# Patient Record
Sex: Female | Born: 1935
Health system: Southern US, Community
[De-identification: ages and names within clinical notes are randomized; demographics above are authoritative.]

## PROBLEM LIST (undated history)

## (undated) DIAGNOSIS — I5189 Other ill-defined heart diseases: Secondary | ICD-10-CM

## (undated) DIAGNOSIS — R32 Unspecified urinary incontinence: Secondary | ICD-10-CM

## (undated) DIAGNOSIS — I2510 Atherosclerotic heart disease of native coronary artery without angina pectoris: Secondary | ICD-10-CM

## (undated) DIAGNOSIS — I639 Cerebral infarction, unspecified: Secondary | ICD-10-CM

## (undated) DIAGNOSIS — E1140 Type 2 diabetes mellitus with diabetic neuropathy, unspecified: Secondary | ICD-10-CM

## (undated) DIAGNOSIS — I10 Essential (primary) hypertension: Secondary | ICD-10-CM

## (undated) DIAGNOSIS — E119 Type 2 diabetes mellitus without complications: Secondary | ICD-10-CM

## (undated) HISTORY — DX: Atherosclerotic heart disease of native coronary artery without angina pectoris: I25.10

## (undated) HISTORY — DX: Unspecified urinary incontinence: R32

## (undated) HISTORY — PX: APPENDECTOMY: SHX54

## (undated) HISTORY — DX: Type 2 diabetes mellitus with diabetic neuropathy, unspecified: E11.40

## (undated) HISTORY — DX: Other ill-defined heart diseases: I51.89

## (undated) HISTORY — PX: ABDOMINAL HYSTERECTOMY: SHX81

## (undated) HISTORY — PX: CATARACT EXTRACTION, BILATERAL: SHX1313

## (undated) HISTORY — PX: BREAST SURGERY: SHX581

---

## 2001-05-14 ENCOUNTER — Ambulatory Visit (HOSPITAL_COMMUNITY): Admission: RE | Admit: 2001-05-14 | Discharge: 2001-05-14 | Payer: Self-pay | Admitting: Family Medicine

## 2001-05-14 ENCOUNTER — Encounter: Payer: Self-pay | Admitting: Family Medicine

## 2003-06-11 ENCOUNTER — Ambulatory Visit (HOSPITAL_COMMUNITY): Admission: RE | Admit: 2003-06-11 | Discharge: 2003-06-11 | Payer: Self-pay | Admitting: Family Medicine

## 2003-06-11 ENCOUNTER — Encounter: Payer: Self-pay | Admitting: Family Medicine

## 2003-06-21 ENCOUNTER — Ambulatory Visit (HOSPITAL_COMMUNITY): Admission: RE | Admit: 2003-06-21 | Discharge: 2003-06-21 | Payer: Self-pay | Admitting: Family Medicine

## 2003-06-21 ENCOUNTER — Encounter: Payer: Self-pay | Admitting: Family Medicine

## 2003-06-29 ENCOUNTER — Ambulatory Visit (HOSPITAL_BASED_OUTPATIENT_CLINIC_OR_DEPARTMENT_OTHER): Admission: RE | Admit: 2003-06-29 | Discharge: 2003-06-30 | Payer: Self-pay | Admitting: Orthopedic Surgery

## 2003-09-10 ENCOUNTER — Encounter: Payer: Self-pay | Admitting: Family Medicine

## 2003-09-10 ENCOUNTER — Ambulatory Visit (HOSPITAL_COMMUNITY): Admission: RE | Admit: 2003-09-10 | Discharge: 2003-09-10 | Payer: Self-pay | Admitting: Family Medicine

## 2004-10-23 ENCOUNTER — Encounter (HOSPITAL_COMMUNITY): Admission: RE | Admit: 2004-10-23 | Discharge: 2004-11-22 | Payer: Self-pay | Admitting: Podiatry

## 2005-03-19 ENCOUNTER — Ambulatory Visit: Payer: Self-pay | Admitting: Orthopedic Surgery

## 2005-03-23 ENCOUNTER — Ambulatory Visit (HOSPITAL_COMMUNITY): Admission: RE | Admit: 2005-03-23 | Discharge: 2005-03-23 | Payer: Self-pay | Admitting: Orthopedic Surgery

## 2005-03-23 ENCOUNTER — Ambulatory Visit: Payer: Self-pay | Admitting: Orthopedic Surgery

## 2005-03-26 ENCOUNTER — Ambulatory Visit: Payer: Self-pay | Admitting: Orthopedic Surgery

## 2005-04-16 ENCOUNTER — Ambulatory Visit: Payer: Self-pay | Admitting: Orthopedic Surgery

## 2005-04-30 ENCOUNTER — Ambulatory Visit: Payer: Self-pay | Admitting: Orthopedic Surgery

## 2005-06-18 ENCOUNTER — Ambulatory Visit: Payer: Self-pay | Admitting: Orthopedic Surgery

## 2005-07-19 ENCOUNTER — Ambulatory Visit (HOSPITAL_COMMUNITY): Admission: RE | Admit: 2005-07-19 | Discharge: 2005-07-19 | Payer: Self-pay | Admitting: Family Medicine

## 2005-07-30 ENCOUNTER — Ambulatory Visit (HOSPITAL_COMMUNITY): Admission: RE | Admit: 2005-07-30 | Discharge: 2005-07-30 | Payer: Self-pay | Admitting: General Surgery

## 2006-04-28 ENCOUNTER — Inpatient Hospital Stay (HOSPITAL_COMMUNITY): Admission: EM | Admit: 2006-04-28 | Discharge: 2006-05-02 | Payer: Self-pay | Admitting: Emergency Medicine

## 2006-05-06 ENCOUNTER — Encounter (INDEPENDENT_AMBULATORY_CARE_PROVIDER_SITE_OTHER): Payer: Self-pay | Admitting: *Deleted

## 2006-05-06 ENCOUNTER — Inpatient Hospital Stay (HOSPITAL_COMMUNITY): Admission: RE | Admit: 2006-05-06 | Discharge: 2006-05-09 | Payer: Self-pay | Admitting: Obstetrics and Gynecology

## 2007-05-28 ENCOUNTER — Ambulatory Visit (HOSPITAL_COMMUNITY): Admission: RE | Admit: 2007-05-28 | Discharge: 2007-05-28 | Payer: Self-pay | Admitting: Family Medicine

## 2007-06-04 ENCOUNTER — Encounter: Payer: Self-pay | Admitting: Orthopedic Surgery

## 2007-07-01 ENCOUNTER — Encounter: Payer: Self-pay | Admitting: Neurosurgery

## 2007-07-25 ENCOUNTER — Encounter: Payer: Self-pay | Admitting: Neurosurgery

## 2007-08-25 ENCOUNTER — Encounter: Payer: Self-pay | Admitting: Neurosurgery

## 2007-09-24 ENCOUNTER — Encounter: Payer: Self-pay | Admitting: Neurosurgery

## 2007-10-25 ENCOUNTER — Encounter: Payer: Self-pay | Admitting: Neurosurgery

## 2008-10-01 ENCOUNTER — Encounter: Payer: Self-pay | Admitting: Orthopedic Surgery

## 2008-10-01 DIAGNOSIS — Z8679 Personal history of other diseases of the circulatory system: Secondary | ICD-10-CM | POA: Insufficient documentation

## 2008-10-01 DIAGNOSIS — IMO0002 Reserved for concepts with insufficient information to code with codable children: Secondary | ICD-10-CM | POA: Insufficient documentation

## 2008-10-01 DIAGNOSIS — E1165 Type 2 diabetes mellitus with hyperglycemia: Secondary | ICD-10-CM | POA: Insufficient documentation

## 2008-10-01 DIAGNOSIS — E118 Type 2 diabetes mellitus with unspecified complications: Secondary | ICD-10-CM

## 2008-10-01 DIAGNOSIS — E1140 Type 2 diabetes mellitus with diabetic neuropathy, unspecified: Secondary | ICD-10-CM | POA: Insufficient documentation

## 2008-10-04 ENCOUNTER — Ambulatory Visit: Payer: Self-pay | Admitting: Orthopedic Surgery

## 2008-10-04 DIAGNOSIS — M479 Spondylosis, unspecified: Secondary | ICD-10-CM | POA: Insufficient documentation

## 2008-10-04 DIAGNOSIS — M25519 Pain in unspecified shoulder: Secondary | ICD-10-CM | POA: Insufficient documentation

## 2008-10-05 ENCOUNTER — Telehealth: Payer: Self-pay | Admitting: Orthopedic Surgery

## 2009-01-12 ENCOUNTER — Ambulatory Visit: Payer: Self-pay | Admitting: Orthopedic Surgery

## 2009-01-12 DIAGNOSIS — M4800 Spinal stenosis, site unspecified: Secondary | ICD-10-CM | POA: Insufficient documentation

## 2009-01-12 DIAGNOSIS — Q762 Congenital spondylolisthesis: Secondary | ICD-10-CM | POA: Insufficient documentation

## 2009-01-12 DIAGNOSIS — M545 Low back pain, unspecified: Secondary | ICD-10-CM | POA: Insufficient documentation

## 2009-01-12 DIAGNOSIS — M25559 Pain in unspecified hip: Secondary | ICD-10-CM | POA: Insufficient documentation

## 2009-01-31 ENCOUNTER — Ambulatory Visit: Payer: Self-pay | Admitting: Orthopedic Surgery

## 2009-02-07 ENCOUNTER — Telehealth: Payer: Self-pay | Admitting: Orthopedic Surgery

## 2009-02-08 ENCOUNTER — Telehealth: Payer: Self-pay | Admitting: Orthopedic Surgery

## 2009-02-15 ENCOUNTER — Encounter: Payer: Self-pay | Admitting: Orthopedic Surgery

## 2009-02-21 ENCOUNTER — Ambulatory Visit: Payer: Self-pay | Admitting: Orthopedic Surgery

## 2009-02-24 ENCOUNTER — Telehealth: Payer: Self-pay | Admitting: Orthopedic Surgery

## 2009-02-25 ENCOUNTER — Encounter: Admission: RE | Admit: 2009-02-25 | Discharge: 2009-02-25 | Payer: Self-pay | Admitting: Orthopedic Surgery

## 2009-03-17 ENCOUNTER — Encounter: Admission: RE | Admit: 2009-03-17 | Discharge: 2009-03-17 | Payer: Self-pay | Admitting: Orthopedic Surgery

## 2009-04-12 ENCOUNTER — Ambulatory Visit: Payer: Self-pay | Admitting: Orthopedic Surgery

## 2009-04-14 ENCOUNTER — Encounter (INDEPENDENT_AMBULATORY_CARE_PROVIDER_SITE_OTHER): Payer: Self-pay | Admitting: *Deleted

## 2009-04-18 ENCOUNTER — Telehealth: Payer: Self-pay | Admitting: Orthopedic Surgery

## 2009-04-27 ENCOUNTER — Encounter: Payer: Self-pay | Admitting: Orthopedic Surgery

## 2009-05-10 ENCOUNTER — Ambulatory Visit (HOSPITAL_COMMUNITY): Admission: RE | Admit: 2009-05-10 | Discharge: 2009-05-10 | Payer: Self-pay | Admitting: Family Medicine

## 2009-05-10 ENCOUNTER — Encounter: Payer: Self-pay | Admitting: Neurosurgery

## 2009-05-24 ENCOUNTER — Encounter: Payer: Self-pay | Admitting: Neurosurgery

## 2009-06-23 ENCOUNTER — Encounter: Payer: Self-pay | Admitting: Neurosurgery

## 2009-07-11 ENCOUNTER — Encounter: Payer: Self-pay | Admitting: Orthopedic Surgery

## 2009-07-17 ENCOUNTER — Emergency Department (HOSPITAL_COMMUNITY): Admission: EM | Admit: 2009-07-17 | Discharge: 2009-07-17 | Payer: Self-pay | Admitting: Emergency Medicine

## 2009-07-24 ENCOUNTER — Encounter: Payer: Self-pay | Admitting: Neurosurgery

## 2009-08-03 ENCOUNTER — Emergency Department (HOSPITAL_COMMUNITY): Admission: EM | Admit: 2009-08-03 | Discharge: 2009-08-03 | Payer: Self-pay | Admitting: Emergency Medicine

## 2009-08-19 ENCOUNTER — Inpatient Hospital Stay (HOSPITAL_COMMUNITY): Admission: RE | Admit: 2009-08-19 | Discharge: 2009-08-25 | Payer: Self-pay | Admitting: Neurosurgery

## 2009-09-06 ENCOUNTER — Inpatient Hospital Stay (HOSPITAL_COMMUNITY): Admission: EM | Admit: 2009-09-06 | Discharge: 2009-09-08 | Payer: Self-pay | Admitting: Emergency Medicine

## 2009-09-06 ENCOUNTER — Ambulatory Visit: Payer: Self-pay | Admitting: Gastroenterology

## 2009-09-07 ENCOUNTER — Ambulatory Visit: Payer: Self-pay | Admitting: Gastroenterology

## 2009-09-14 ENCOUNTER — Encounter: Payer: Self-pay | Admitting: Orthopedic Surgery

## 2009-09-15 ENCOUNTER — Telehealth (INDEPENDENT_AMBULATORY_CARE_PROVIDER_SITE_OTHER): Payer: Self-pay

## 2009-10-11 ENCOUNTER — Encounter: Payer: Self-pay | Admitting: Gastroenterology

## 2010-02-08 ENCOUNTER — Encounter: Payer: Self-pay | Admitting: Orthopedic Surgery

## 2011-01-14 ENCOUNTER — Encounter: Payer: Self-pay | Admitting: Orthopedic Surgery

## 2011-01-25 NOTE — Letter (Signed)
Summary: Office notes from Dr. Venetia Maxon  Office notes from Dr. Venetia Maxon   Imported By: Jacklynn Ganong 05/30/2009 13:53:10  _____________________________________________________________________  External Attachment:    Type:   Image     Comment:   External Document

## 2011-01-25 NOTE — Assessment & Plan Note (Signed)
Summary: FOL UP AFTER 2ND ESI/MEDICARE/BCBS/BSF   History of Present Illness: I saw [name] in the office today for a followup visit.  She is a 77 years old woman with the complaint of:  follow up after 2nd esi.  DX:  spondylosis from Degen Disc Disease   Medications: Darvocet and neurontin. (naprosyn, IM injection)   ESI x 2 did not help   Has lower back pain and numbness down left leg to the foot.  Previously seen by Dr. Venetia Maxon in the past.     Allergies: 1)  ! Codeine  Past History:  Past Medical History:    High Blood Pressure    Diabetes     (10/01/2008)  Past Surgical History:    Foot Surgery    Right Knee Arthroscopy  03/23/05 by Dr. Romeo Apple (10/01/2008)  Family History:    Family History Coronary Heart Disease female < 55     (10/04/2008)  Social History:    Patient is married.     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        funeral services  Review of Systems       Bowel and bladder function reportedly remain intact   Impression & Recommendations:  Problem # 1:  SPINAL STENOSIS (ICD-724.00)  Orders: Neurosurgeon Referral (Neurosurgeon) Est. Patient Level II (54098)  Problem # 2:  SPONDYLOLYSIS (JXB-147.82)  Orders: Neurosurgeon Referral (Neurosurgeon) Est. Patient Level II (95621)  Problem # 3:  LOW BACK PAIN (ICD-724.2)  Her updated medication list for this problem includes:    Diclofenac  Potassium 50 Mg Tabs (Diclofenac potassium) .[name]... 1 by mouth two times a day    Naproxen 500 Mg Tabs (Naproxen)    Darvocet-n 100 100-650 Mg Tabs (Propoxyphene n-apap) .[name]... 1-2 by mouth q 4 as needed  I have discussed the situation with our patient today.  She basically has multilevel bone and disc changes with facet arthrosis and spondylosis and a potential L4 root impingement with continued LEFT leg pain and lower back pain which have been unrelieved by 2 epidural injections, IM shot of steroids, Neurontin, and Darvocet.  I am referring her to the neurosurgeon Dr. Venetia Maxon.  Orders: Neurosurgeon Referral (Neurosurgeon) Est. Patient Level II [phone])  Patient Instructions: 1)  Refer to Dr. Venetia Maxon 2)  Has seen him for her neck before. Prescriptions: DARVOCET-N 100 100-650 MG TABS (PROPOXYPHENE N-APAP) 1-2 by mouth q 4 as needed  #60 x 2   Entered and Authorized by:   Fuller Canada MD   Signed by:   Fuller Canada MD on 04/12/2009   Method used:   Print then Give to Patient   RxID:   [id number]

## 2011-01-25 NOTE — Progress Notes (Signed)
Summary: Office Visit  Office Visit   Imported By: Jacklynn Ganong 10/01/2008 09:55:50  _____________________________________________________________________  External Attachment:    Type:   Image     Comment:   initial office visit

## 2011-01-25 NOTE — Letter (Signed)
Summary: [facility] office note Dr Leitha Bleak office note Dr Venetia Maxon   Imported By: Cammie Sickle 07/19/2009 19:30:52  _____________________________________________________________________  External Attachment:    Type:   Image     Comment:   External Document

## 2011-01-25 NOTE — Progress Notes (Signed)
Summary: MRI appointment.  Phone Note Outgoing Call   Call placed by: Waldon Reining,  February 08, 2009 12:33 PM Call placed to: Patient Action Taken: Phone Call Completed, Appt scheduled Summary of Call: I called to to give patient her MRI appointment at [facility] on 02-15-09 at 11:30. Patient has medicare and BCBS, no precert is required. Patient will follow up here for results.

## 2011-01-25 NOTE — Progress Notes (Signed)
Summary: Pt's ESI appt scheduled   Phone Note Outgoing Call   Call placed to: Patient Summary of Call: I called patient back in response to her call earlier today in which she expressed concern about when her ESI appointment may be scheduled; states back is really hurting. I verified that our office faxed the referral order today to [facility] in [place].   Patient relates that she did receive a call back from [facility].  Her ESI appt is scheduled for tomorrow, 02/25/09 at 2:30. Initial call taken by: Cammie Sickle,  February 24, 2009 3:29 PM

## 2011-01-25 NOTE — Progress Notes (Signed)
Summary: Appt scheduled at [facility]  Phone Note From Other Clinic   Caller: Referral Coordinator Summary of Call: Mattie from [facility]/Dr Stern's office, has appointment scheduled for Apr 27, 2009 at 1:00PM; states patient has been notified. I also contacted patient and verified appt information. Initial call taken by: Cammie Sickle,  April 18, 2009 9:43 AM

## 2011-01-25 NOTE — Assessment & Plan Note (Signed)
Summary: LEFT KNEE PAIN/NO XR/MEDICARE/BCBS/BSF    History of Present Illness: I saw [name] in the office today for a followup visit.  She is a 77 years old woman with the complaint of:  left knee pain.  Patient will have xrays today.  Patient states that she is getting numbness from her knee down to her toes, most of the time. She says that she has back pain most of the time.  Patient had MRI of L-spine on 04-29-06.  ESI SEVERAL YEARS AGO. SHE SEE DR. STERN FOR CERVIAL SPONDYLOSIS.   Takes neurontin 600mg  three times a day and naprosyn 500mg  two times a day.        Updated Prior Medication List: meds to be recorded have been reviewed Current Allergies (reviewed today): ! CODEINE  Past Medical History:    Reviewed history from 10/01/2008 and no changes required:       High Blood Pressure       Diabetes  Past Surgical History:    Reviewed history from 10/01/2008 and no changes required:       Foot Surgery       Right Knee Arthroscopy  03/23/05 by Dr. 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 She does have decreased sensation in the L5 dermatome. The knee has full range of motion but there is significant valgus malalignment, it is stable. Her straight leg raise sitting is normal soft touch and deep pressure sensation is normal her leg    Impression & Recommendations:  Problem # 1:  SPONDYLOLYSIS (ICD-756.11) Assessment: Deteriorated MRI WAS FROM 2007 [facility])    reviewed, spondylosis and spinal stenosis with degenerative disc disease.  Intramuscular injection of steroid was given. Sterile technique. Depo-Medrol 40 mg 1 cc and lidocaine 1% 4 cc.  If she does not improve, MRI and epidural injection. Orders: Est. Patient Level III (81191)   Problem # 2:  LOW BACK PAIN (ICD-724.2) Assessment: Deteriorated  Orders: Est. Patient Level III (47829)  Her updated medication list for this problem includes:    Diclofenac Potassium 50 Mg Tabs (Diclofenac potassium) .[name]... 1 by mouth two times a day   Problem # 3:  SPINAL STENOSIS (ICD-724.00) Assessment: Deteriorated  Orders: Est. Patient Level III (56213)   Medications Added to Medication List This Visit: 1)  Diclofenac Potassium 50 Mg Tabs (Diclofenac potassium) .[name].. 1 by mouth two times a day  Other Orders: Depo- Medrol 40mg  (J1030) Joint Aspirate / Injection, Large (20610)   Patient Instructions: 1)  You have received an injection of cortisone today. You may experience increased pain at the injection site. Apply ice pack to the area for 20 minutes every 2 hours and take 2 xtra strength tylenol every 8 hours. This increased pain will usually resolve in 24 hours. The injection will take effect in  3-10 days.  2)  Please schedule a follow-up appointment in 2 weeks.   Prescriptions: DICLOFENAC POTASSIUM 50 MG TABS (DICLOFENAC POTASSIUM) 1 by mouth two times a day  #60 x 5   Entered and Authorized by:   Fuller Canada MD   Signed by:   Fuller Canada MD on 01/12/2009   Method used:   Print then Give to Patient   RxID:   [id number]

## 2011-01-25 NOTE — Medication Information (Signed)
Summary: Tax adviser   Imported By: Elvera Maria 10/05/2008 10:50:27  _____________________________________________________________________  External Attachment:    Type:   Image     Comment:   medications

## 2011-01-25 NOTE — Progress Notes (Signed)
Summary: ESI referral.  Phone Note Outgoing Call   Call placed by: Waldon Reining,  February 24, 2009 1:39 PM Call placed to: Specialist Action Taken: Information Sent Summary of Call: I faxed a referral for this patient to [facility] for [facility] of L4-L5, series of 3 injections.

## 2011-01-25 NOTE — Assessment & Plan Note (Signed)
Summary: RE-CK LT KNEE/FOL'G INJEC/MEDICARE/BCBS/CAF    History of Present Illness: I saw [name] in the office today for a followup visit.  She is a 77 years old woman with the complaint of: 2 week recheck on left knee after injection and diclofenac.  The injection and med did not help, has numbness in both legs.  Takes neurontin 600mg  three times a day.  PLAN: Problem # 1:  SPONDYLOLYSIS (ICD-756.11) Assessment: Deteriorated MRI WAS FROM 2007 [facility])  reviewed, spondylosis and spinal stenosis with degenerative disc disease.  Intramuscular injection of steroid was given. Sterile technique. Depo-Medrol 40 mg 1 cc and lidocaine 1% 4 cc.  If she does not improve, MRI and epidural injection.  HISTORY:  Patient states that she is getting numbness from her knee down to her toes, most of the time. She says that she has back pain most of the time.  Patient had MRI of L-spine on 04-29-06.  ESI SEVERAL YEARS AGO. SHE SEE DR. STERN FOR CERVIAL SPONDYLOSIS.   Takes neurontin 600mg  three times a day and naprosyn 500mg  two times a day.        Current Allergies: ! CODEINE        Impression & Recommendations:  Problem # 1:  SPINAL STENOSIS (ICD-724.00)  Orders: Est. Patient Level II (69629)   Problem # 2:  SPONDYLOLYSIS (BMW-413.24)  Orders: Est. Patient Level II (40102)   Problem # 3:  LOW BACK PAIN (ICD-724.2)  Her updated medication list for this problem includes:    Diclofenac Potassium 50 Mg Tabs (Diclofenac potassium) .[name]... 1 by mouth two times a day    Naproxen 500 Mg Tabs (Naproxen)    Darvocet-n 100 100-650 Mg Tabs (Propoxyphene n-apap) .[name]... 1-2 by mouth q 4 as needed  Orders: Est. Patient Level II (72536)   Medications Added to Medication List This Visit: 1)  Darvocet-n 100 100-650 Mg Tabs (Propoxyphene n-apap) .[name].. 1-2 by mouth q 4 as needed   Patient Instructions: 1)  MRI L spine at [facility] [place] 2)  Take darvocet for pain     Prescriptions: DARVOCET-N 100 100-650 MG TABS (PROPOXYPHENE N-APAP) 1-2 by mouth q 4 as needed  #60 x 2   Entered and Authorized by:   Fuller Canada MD   Signed by:   Fuller Canada MD on 01/31/2009   Method used:   Print then Give to Patient   RxID:   [id number]

## 2011-01-25 NOTE — Progress Notes (Signed)
Summary: pt has question re: injection   Phone Note Call from Patient   Caller: Patient Summary of Call: Pt has question about "shot" in January - state  thinks it was not coritsone; states different feeling  than previous injection.  Has been in since this visit. Please advise pt - [phone] phone#. Initial call taken by: Cammie Sickle,  February 07, 2009 9:01 AM  Follow-up for Phone Call        all of our injections are of cortisone, if problems, come back in for appt. Follow-up by: Ether Griffins,  February 07, 2009 9:07 AM  Additional Follow-up for Phone Call Additional follow up Details #1::        called patient and advised.  appt offered; pt does not wish to schedule appt at this time. Additional Follow-up by: Cammie Sickle,  February 07, 2009 10:13 AM

## 2011-01-25 NOTE — Miscellaneous (Signed)
Summary: med update  Clinical Lists Changes  Medications: Added new medication of LISINOPRIL-HYDROCHLOROTHIAZIDE 20-12.5 MG TABS (LISINOPRIL-HYDROCHLOROTHIAZIDE) Added new medication of NAPROXEN 500 MG TABS (NAPROXEN) Added new medication of SIMVASTATIN 20 MG TABS (SIMVASTATIN) Added new medication of FEXOFENADINE HCL 180 MG TABS (FEXOFENADINE HCL) Added new medication of METFORMIN HCL 500 MG TABS (METFORMIN HCL) Added new medication of GLUCOTROL 5 MG TABS (GLIPIZIDE) Added new medication of ACTOS 15 MG TABS (PIOGLITAZONE HCL) Added new medication of NEURONTIN 600 MG TABS (GABAPENTIN) one by mouth tid

## 2011-01-25 NOTE — Progress Notes (Signed)
Summary: pt called w/question re: appt w/Dr Venetia Maxon  Phone Note Call from Patient   Caller: Patient Summary of Call: Pt called; asked to speak w/Dr Romeo Apple re: visit 10/04/08. States has called [facility]/Dr Venetia Maxon as advised yesterday, and was given appointment for 11/15/08, 8:30AM.  Concerned about waiting until then.  Pt ph [phone]  Initial call taken by: Cammie Sickle,  October 05, 2008 9:55 AM  Follow-up for Phone Call        if that is her appt date and time, she will have to wait until then, this is a normal waiting period. Follow-up by: Ether Griffins,  October 05, 2008 10:22 AM  Additional Follow-up for Phone Call Additional follow up Details #1::        Spoke w/patient; advised. Patient relates that she was contacted by [facility]/Dr Stern's office and is scheduled for [facility] 10/27/08 Additional Follow-up by: Cammie Sickle,  October 05, 2008 6:16 PM

## 2011-01-25 NOTE — Progress Notes (Signed)
  Phone Note Call from Patient   Caller: Patient Summary of Call: Pt called and wanted to know if we could give her anything to make her feel better. She said she was just recently discharged from the hospital. She said she is not in any pain, No N/V. Regular BM's. just feels awful, and weak. Please advise! Initial call taken by: Cloria Spring LPN,  September 15, 2009 2:54 PM     Appended Document:  Please call pt. If she feels weak and has no GI Sx it's not likely related to her bowel problem. She should see Dr. Nobie Putnam.  Appended Document:  Pt informed.

## 2011-01-25 NOTE — Letter (Signed)
Summary: Office notes [facility] Dr Venetia Maxon  Office notes [facility] Dr Venetia Maxon   Imported By: Cammie Sickle 01/10/2010 17:26:48  _____________________________________________________________________  External Attachment:    Type:   Image     Comment:   External Document

## 2011-01-25 NOTE — Assessment & Plan Note (Signed)
Summary: lt shoulder pain/need xray/medicare,bcbs/frs   Vital Signs:  Patient Profile:   77 Years Old Female Weight:      200 pounds Pulse rate:   78 / minute Resp:     16 per minute                 Chief Complaint:  left shoulder pain/neck pain.  History of Present Illness: I saw [name] in the office today for an initial visit.  She is a 77 years old woman with the complaint of:  neck pain and left shoulder pain.  Patient states that she has pain in her neck that runs down into her hand for 2 months.  She was scheduled last October to have neck surgery by Dr. Venetia Maxon, but her neck got better so she did not have surgery.  Patient had MRI at [facility] on 06-04-07 which shows spinal stenosis.  She has left shoulder pain and weakness with mild to moderate loss of forward elevation. Reports no trauma in the left shoulder.    Current Allergies (reviewed today): ! CODEINE  Past Medical History:    Reviewed history from 10/01/2008 and no changes required:       High Blood Pressure       Diabetes  Past Surgical History:    Reviewed history from 10/01/2008 and no changes required:       Foot Surgery       Right Knee Arthroscopy  03/23/05 by Dr. Romeo Apple   Family History:    Family History Coronary Heart Disease female < 49  Social History:    Patient is married.     funeral services   Risk Factors:  Tobacco use:  never Caffeine use:  0 drinks per day Alcohol use:  no  Family History Risk Factors:    Family History of MI in males < 24 years old:  yes   Review of Systems  General      Denies weight loss, weight gain, fever, chills, and fatigue.  Cardiac      Denies chest pain, angina, heart attack, heart failure, poor circulation, blood clots, and phlebitis.  Resp      Denies short of breath, difficulty breathing, COPD, cough, and pneumonia.  GI      Denies nausea, vomiting, diarrhea, constipation, difficulty swallowing, ulcers, GERD, and  reflux.  GU      Denies kidney failure, kidney transplant, kidney stones, burning, poor stream, testicular cancer, blood in urine, and .  Neuro      Complains of headache.      Denies dizziness, migraines, numbness, weakness, tremor, and unsteady walking.  MS      Denies joint pain, rheumatoid arthritis, joint swelling, gout, bone cancer, osteoporosis, and .  Endo      Complains of diabetes.      Denies thyroid disease and goiter.  Psych      Denies depression, mood swings, anxiety, panic attack, bipolar, and schizophrenia.  Derm      Denies eczema, cancer, and itching.  EENT      Denies poor vision, cataracts, glaucoma, poor hearing, vertigo, ears ringing, sinusitis, hoarseness, toothaches, and bleeding gums.  Immunology      Denies seasonal allergies, sinus problems, and allergic to bee stings.  Lymphatic      Denies lymph node cancer and lymph edema.   Physical Exam  The patient as well groomed, normal development, normal nutrition, no deformities.  She has good strong pulses and a radial and  ulnar artery with normal temperature no edema no swelling in both upper extremities  There are no palpable lymph nodes in the cervical or supraclavicular areas  There are no palpable subcutaneous mass is in the neck trunk right or left upper extremity  Deep tendon reflexes are normal, sensation is normal in both upper extremities. Orientation to time person place and mood and affect are normal.  She has decreased cervical rotation to her left normal to her right, slight decreased flexion and extension with tenderness in the upper thoracic and lower cervical midline. There is tenderness in the left trapezius muscle.  She has normal forward elevation in the right shoulder with no instability and a negative impingement sign muscle strength and tone are normal  There is decreased forward elevation on the left with painful passive forward elevation and a positive impingement sign  with posterior acromial and subacromial tenderness    Impression & Recommendations:  Problem # 1:  SHOULDER PAIN (ICD-719.41) Assessment: New verbal consent for a left subacromial injection: The left subacromial space was injected with 40 mg of Depo-Medrol and 1% lidocaine a total of 4 cc. This was tolerated well without complication.   Orders: New Patient Level III (13086) Depo- Medrol 40mg  (J1030) Joint Aspirate / Injection, Large (20610)   Problem # 2:  SPONDYLOSIS (ICD-721.90) Assessment: New The patient has a cervical MRI which showed significant cervical spondylosis with multifactorial causes.  I advised her to seek spinal consultation to reevaluate the situation as she has worsened and says "I can function like this"     Patient Instructions: 1)  You have received an injection of cortisone today. You may experience increased pain at the injection site. Apply ice pack to the area for 20 minutes every 2 hours and take 2 xtra strength tylenol every 8 hours. This increased pain will usually resolve in 24 hours. The injection will take effect in 3-10 days.   2)  Please reschedula an appointment to Dr. Venetia Maxon to discuss the neck paina and numbness and tingling in the hand    ]

## 2011-01-25 NOTE — Letter (Signed)
Summary: [facility] office note Dr Leitha Bleak office note Dr Venetia Maxon   Imported By: Cammie Sickle 04/15/2010 12:28:14  _____________________________________________________________________  External Attachment:    Type:   Image     Comment:   External Document

## 2011-01-25 NOTE — Miscellaneous (Signed)
Summary: faxed referred and notes for stern to review to be seen  Clinical Lists Changes

## 2011-01-25 NOTE — Assessment & Plan Note (Signed)
Summary: mri results from [facility]/pt aware to bring films/frs    History of Present Illness: I saw [name] in the office today for a followup visit.  She is a 77 years old woman with the complaint of:  c/o left leg / knee pain   failed joint injection  MRI results of the lumbar spine taken at [facility] on 02/15/09.  Report for review, scanned into EMR.  Neurontin 600mg  three times a day, and darvocet for pain.  Pain in left leg is around 5 today.     HISTORY I saw [name] in the office today for a followup visit.  She is a 77 years old woman with the complaint of: 2 week recheck on left knee after injection and diclofenac.  The injection and med did not help, has numbness in both legs.  Takes neurontin 600mg  three times a day.  PLAN: Problem # 1:  SPONDYLOLYSIS (ICD-756.11) Assessment: Deteriorated MRI WAS FROM 2007 [facility])  reviewed, spondylosis and spinal stenosis with degenerative disc disease.  Intramuscular injection of steroid was given. Sterile technique. Depo-Medrol 40 mg 1 cc and lidocaine 1% 4 cc.  If she does not improve, MRI and epidural injection.  HISTORY:  Patient states that she is getting numbness from her knee down to her toes, most of the time. She says that she has back pain most of the time.  Patient had MRI of L-spine on 04-29-06.  ESI SEVERAL YEARS AGO. SHE SEE DR. STERN FOR CERVIAL SPONDYLOSIS.   Takes neurontin 600mg  three times a day and naprosyn 500mg  two times a day.        Current Allergies: ! CODEINE        Impression & Recommendations:  Problem # 1:  SPINAL STENOSIS (ICD-724.00) Assessment: Comment Only There is multilevel disc disease in the lumbar spine with L4-L5 extrusion with L4 nerve root impingement.  Recommend epidural at L4-L5. Orders: Misc. Referral (Misc. Ref) Est. Patient Level II (16109)   Problem # 2:  SPONDYLOLYSIS (ICD-756.11) Assessment: Comment Only  Orders: Misc. Referral  (Misc. Ref) Est. Patient Level II (60454)   Problem # 3:  LOW BACK PAIN (ICD-724.2) Assessment: Comment Only  Her updated medication list for this problem includes:    Diclofenac Potassium 50 Mg Tabs (Diclofenac potassium) .[name]... 1 by mouth two times a day    Naproxen 500 Mg Tabs (Naproxen)    Darvocet-n 100 100-650 Mg Tabs (Propoxyphene n-apap) .[name]... 1-2 by mouth q 4 as needed  Orders: Misc. Referral (Misc. Ref) Est. Patient Level II (09811)    Patient Instructions: 1)  ESI L4-L5. 2)  Come back after 2nd injection.

## 2011-01-25 NOTE — Letter (Signed)
Summary: Historic Patient File  Historic Patient File   Imported By: Elvera Maria 10/05/2008 10:39:56  _____________________________________________________________________  External Attachment:    Type:   Image     Comment:   history form

## 2011-01-25 NOTE — Op Note (Signed)
Summary: Operative Report  Operative Report   Imported By: Jacklynn Ganong 10/01/2008 09:57:10  _____________________________________________________________________  External Attachment:    Type:   Image     Comment:   right knee arthroscopy

## 2011-01-25 NOTE — Progress Notes (Signed)
Summary: Office Visit  Office Visit   Imported By: Jacklynn Ganong 10/01/2008 09:57:56  _____________________________________________________________________  External Attachment:    Type:   Image     Comment:   progress note

## 2011-01-25 NOTE — Consult Note (Signed)
Summary: Consultation Report  Consultation Report   Imported By: Diana Eves 10/11/2009 15:42:56  _____________________________________________________________________  External Attachment:    Type:   Image     Comment:   External Document

## 2011-03-30 LAB — CLOSTRIDIUM DIFFICILE EIA: C difficile Toxins A+B, EIA: NEGATIVE

## 2011-03-30 LAB — GLUCOSE, CAPILLARY
Glucose-Capillary: 113 mg/dL — ABNORMAL HIGH (ref 70–99)
Glucose-Capillary: 136 mg/dL — ABNORMAL HIGH (ref 70–99)
Glucose-Capillary: 111 mg/dL — ABNORMAL HIGH (ref 70–99)
Glucose-Capillary: 113 mg/dL — ABNORMAL HIGH (ref 70–99)
Glucose-Capillary: 120 mg/dL — ABNORMAL HIGH (ref 70–99)
Glucose-Capillary: 168 mg/dL — ABNORMAL HIGH (ref 70–99)
Glucose-Capillary: 224 mg/dL — ABNORMAL HIGH (ref 70–99)
Glucose-Capillary: 51 mg/dL — ABNORMAL LOW (ref 70–99)
Glucose-Capillary: 68 mg/dL — ABNORMAL LOW (ref 70–99)
Glucose-Capillary: 88 mg/dL (ref 70–99)
Glucose-Capillary: 121 mg/dL — ABNORMAL HIGH (ref 70–99)
Glucose-Capillary: 143 mg/dL — ABNORMAL HIGH (ref 70–99)
Glucose-Capillary: 144 mg/dL — ABNORMAL HIGH (ref 70–99)
Glucose-Capillary: 148 mg/dL — ABNORMAL HIGH (ref 70–99)
Glucose-Capillary: 149 mg/dL — ABNORMAL HIGH (ref 70–99)
Glucose-Capillary: 158 mg/dL — ABNORMAL HIGH (ref 70–99)
Glucose-Capillary: 175 mg/dL — ABNORMAL HIGH (ref 70–99)
Glucose-Capillary: 205 mg/dL — ABNORMAL HIGH (ref 70–99)

## 2011-03-30 LAB — COMPREHENSIVE METABOLIC PANEL
ALT: 17 U/L (ref 0–35)
ALT: 17 U/L (ref 0–35)
AST: 20 U/L (ref 0–37)
AST: 21 U/L (ref 0–37)
Albumin: 2.9 g/dL — ABNORMAL LOW (ref 3.5–5.2)
Albumin: 3 g/dL — ABNORMAL LOW (ref 3.5–5.2)
Alkaline Phosphatase: 117 U/L (ref 39–117)
Alkaline Phosphatase: 121 U/L — ABNORMAL HIGH (ref 39–117)
BUN: 12 mg/dL (ref 6–23)
BUN: 27 mg/dL — ABNORMAL HIGH (ref 6–23)
CO2: 27 mEq/L (ref 19–32)
CO2: 31 mEq/L (ref 19–32)
Calcium: 9.1 mg/dL (ref 8.4–10.5)
Calcium: 9.2 mg/dL (ref 8.4–10.5)
Chloride: 102 mEq/L (ref 96–112)
Chloride: 104 mEq/L (ref 96–112)
Creatinine, Ser: 0.88 mg/dL (ref 0.4–1.2)
Creatinine, Ser: 1.11 mg/dL (ref 0.4–1.2)
GFR calc Af Amer: 58 mL/min — ABNORMAL LOW (ref >60)
GFR calc Af Amer: >60 mL/min (ref >60)
GFR calc non Af Amer: 48 mL/min — ABNORMAL LOW (ref >60)
GFR calc non Af Amer: >60 mL/min (ref >60)
Glucose, Bld: 127 mg/dL — ABNORMAL HIGH (ref 70–99)
Glucose, Bld: 163 mg/dL — ABNORMAL HIGH (ref 70–99)
Potassium: 3.3 mEq/L — ABNORMAL LOW (ref 3.5–5.1)
Potassium: 3.6 mEq/L (ref 3.5–5.1)
Sodium: 138 mEq/L (ref 135–145)
Sodium: 140 mEq/L (ref 135–145)
Total Bilirubin: 0.7 mg/dL (ref 0.3–1.2)
Total Bilirubin: 1.2 mg/dL (ref 0.3–1.2)
Total Protein: 5.9 g/dL — ABNORMAL LOW (ref 6.0–8.3)
Total Protein: 5.9 g/dL — ABNORMAL LOW (ref 6.0–8.3)

## 2011-03-30 LAB — URINALYSIS, ROUTINE W REFLEX MICROSCOPIC
Glucose, UA: NEGATIVE mg/dL
Hgb urine dipstick: NEGATIVE
Ketones, ur: NEGATIVE mg/dL
Nitrite: NEGATIVE
Protein, ur: NEGATIVE mg/dL
Specific Gravity, Urine: >1.03 — ABNORMAL HIGH (ref 1.005–1.030)
Urobilinogen, UA: 0.2 mg/dL (ref 0.0–1.0)
pH: 5 (ref 5.0–8.0)

## 2011-03-30 LAB — DIFFERENTIAL
Basophils Absolute: 0 10*3/uL (ref 0.0–0.1)
Basophils Absolute: 0.2 10*3/uL — ABNORMAL HIGH (ref 0.0–0.1)
Basophils Relative: 0 % (ref 0–1)
Basophils Relative: 1 % (ref 0–1)
Eosinophils Absolute: 0 10*3/uL (ref 0.0–0.7)
Eosinophils Absolute: 0.1 10*3/uL (ref 0.0–0.7)
Eosinophils Relative: 0 % (ref 0–5)
Eosinophils Relative: 1 % (ref 0–5)
Lymphocytes Relative: 13 % (ref 12–46)
Lymphocytes Relative: 18 % (ref 12–46)
Lymphs Abs: 1.9 10*3/uL (ref 0.7–4.0)
Lymphs Abs: 2.3 10*3/uL (ref 0.7–4.0)
Monocytes Absolute: 0.9 10*3/uL (ref 0.1–1.0)
Monocytes Absolute: 1.7 10*3/uL — ABNORMAL HIGH (ref 0.1–1.0)
Monocytes Relative: 11 % (ref 3–12)
Monocytes Relative: 7 % (ref 3–12)
Neutro Abs: 11.4 10*3/uL — ABNORMAL HIGH (ref 1.7–7.7)
Neutro Abs: 9.8 10*3/uL — ABNORMAL HIGH (ref 1.7–7.7)
Neutrophils Relative %: 74 % (ref 43–77)
Neutrophils Relative %: 75 % (ref 43–77)

## 2011-03-30 LAB — CBC
HCT: 33.3 % — ABNORMAL LOW (ref 36.0–46.0)
HCT: 34.9 % — ABNORMAL LOW (ref 36.0–46.0)
Hemoglobin: 11.2 g/dL — ABNORMAL LOW (ref 12.0–15.0)
Hemoglobin: 12.1 g/dL (ref 12.0–15.0)
MCHC: 33.7 g/dL (ref 30.0–36.0)
MCHC: 34.6 g/dL (ref 30.0–36.0)
MCV: 91.5 fL (ref 78.0–100.0)
MCV: 92.6 fL (ref 78.0–100.0)
Platelets: 222 10*3/uL (ref 150–400)
Platelets: 224 10*3/uL (ref 150–400)
RBC: 3.59 MIL/uL — ABNORMAL LOW (ref 3.87–5.11)
RBC: 3.81 MIL/uL — ABNORMAL LOW (ref 3.87–5.11)
RDW: 13.6 % (ref 11.5–15.5)
RDW: 13.6 % (ref 11.5–15.5)
WBC: 13.3 10*3/uL — ABNORMAL HIGH (ref 4.0–10.5)
WBC: 15.2 10*3/uL — ABNORMAL HIGH (ref 4.0–10.5)

## 2011-03-30 LAB — STOOL CULTURE

## 2011-03-30 LAB — LIPASE, BLOOD: Lipase: <10 U/L — ABNORMAL LOW (ref 11–59)

## 2011-03-31 LAB — COMPREHENSIVE METABOLIC PANEL
ALT: 15 U/L (ref 0–35)
AST: 24 U/L (ref 0–37)
Albumin: 3.5 g/dL (ref 3.5–5.2)
Alkaline Phosphatase: 57 U/L (ref 39–117)
BUN: 13 mg/dL (ref 6–23)
CO2: 26 mEq/L (ref 19–32)
Calcium: 9.5 mg/dL (ref 8.4–10.5)
Chloride: 109 mEq/L (ref 96–112)
Creatinine, Ser: 0.91 mg/dL (ref 0.4–1.2)
GFR calc Af Amer: >60 mL/min (ref >60)
GFR calc non Af Amer: 60 mL/min — ABNORMAL LOW (ref >60)
Glucose, Bld: 129 mg/dL — ABNORMAL HIGH (ref 70–99)
Potassium: 3.5 mEq/L (ref 3.5–5.1)
Sodium: 142 mEq/L (ref 135–145)
Total Bilirubin: 0.8 mg/dL (ref 0.3–1.2)
Total Protein: 6.4 g/dL (ref 6.0–8.3)

## 2011-03-31 LAB — DIFFERENTIAL
Basophils Absolute: 0.1 10*3/uL (ref 0.0–0.1)
Basophils Relative: 1 % (ref 0–1)
Eosinophils Absolute: 0.1 10*3/uL (ref 0.0–0.7)
Eosinophils Relative: 1 % (ref 0–5)
Lymphocytes Relative: 19 % (ref 12–46)
Lymphs Abs: 1.6 10*3/uL (ref 0.7–4.0)
Monocytes Absolute: 0.8 10*3/uL (ref 0.1–1.0)
Monocytes Relative: 10 % (ref 3–12)
Neutro Abs: 5.8 10*3/uL (ref 1.7–7.7)
Neutrophils Relative %: 69 % (ref 43–77)

## 2011-03-31 LAB — GLUCOSE, CAPILLARY
Glucose-Capillary: 100 mg/dL — ABNORMAL HIGH (ref 70–99)
Glucose-Capillary: 110 mg/dL — ABNORMAL HIGH (ref 70–99)
Glucose-Capillary: 130 mg/dL — ABNORMAL HIGH (ref 70–99)
Glucose-Capillary: 133 mg/dL — ABNORMAL HIGH (ref 70–99)
Glucose-Capillary: 135 mg/dL — ABNORMAL HIGH (ref 70–99)
Glucose-Capillary: 136 mg/dL — ABNORMAL HIGH (ref 70–99)
Glucose-Capillary: 140 mg/dL — ABNORMAL HIGH (ref 70–99)
Glucose-Capillary: 141 mg/dL — ABNORMAL HIGH (ref 70–99)
Glucose-Capillary: 142 mg/dL — ABNORMAL HIGH (ref 70–99)
Glucose-Capillary: 157 mg/dL — ABNORMAL HIGH (ref 70–99)
Glucose-Capillary: 169 mg/dL — ABNORMAL HIGH (ref 70–99)
Glucose-Capillary: 171 mg/dL — ABNORMAL HIGH (ref 70–99)
Glucose-Capillary: 175 mg/dL — ABNORMAL HIGH (ref 70–99)
Glucose-Capillary: 182 mg/dL — ABNORMAL HIGH (ref 70–99)
Glucose-Capillary: 183 mg/dL — ABNORMAL HIGH (ref 70–99)
Glucose-Capillary: 185 mg/dL — ABNORMAL HIGH (ref 70–99)
Glucose-Capillary: 187 mg/dL — ABNORMAL HIGH (ref 70–99)
Glucose-Capillary: 189 mg/dL — ABNORMAL HIGH (ref 70–99)
Glucose-Capillary: 193 mg/dL — ABNORMAL HIGH (ref 70–99)
Glucose-Capillary: 221 mg/dL — ABNORMAL HIGH (ref 70–99)
Glucose-Capillary: 254 mg/dL — ABNORMAL HIGH (ref 70–99)
Glucose-Capillary: >600 mg/dL (ref 70–99)

## 2011-03-31 LAB — CBC
HCT: 39.9 % (ref 36.0–46.0)
HCT: 37.3 % (ref 36.0–46.0)
Hemoglobin: 13.2 g/dL (ref 12.0–15.0)
Hemoglobin: 12.7 g/dL (ref 12.0–15.0)
MCHC: 33.2 g/dL (ref 30.0–36.0)
MCHC: 34 g/dL (ref 30.0–36.0)
MCV: 94.8 fL (ref 78.0–100.0)
MCV: 92.6 fL (ref 78.0–100.0)
Platelets: 258 10*3/uL (ref 150–400)
Platelets: 221 10*3/uL (ref 150–400)
RBC: 4.21 MIL/uL (ref 3.87–5.11)
RBC: 4.03 MIL/uL (ref 3.87–5.11)
RDW: 12.9 % (ref 11.5–15.5)
RDW: 12.8 % (ref 11.5–15.5)
WBC: 10.3 10*3/uL (ref 4.0–10.5)
WBC: 8.5 10*3/uL (ref 4.0–10.5)

## 2011-03-31 LAB — URINALYSIS, ROUTINE W REFLEX MICROSCOPIC
Bilirubin Urine: NEGATIVE
Glucose, UA: NEGATIVE mg/dL
Hgb urine dipstick: NEGATIVE
Ketones, ur: 15 mg/dL — AB
Nitrite: NEGATIVE
Protein, ur: NEGATIVE mg/dL
Specific Gravity, Urine: 1.015 (ref 1.005–1.030)
Urobilinogen, UA: 0.2 mg/dL (ref 0.0–1.0)
pH: 7.5 (ref 5.0–8.0)

## 2011-03-31 LAB — BASIC METABOLIC PANEL
BUN: 16 mg/dL (ref 6–23)
CO2: 28 mEq/L (ref 19–32)
Calcium: 9.8 mg/dL (ref 8.4–10.5)
Chloride: 101 mEq/L (ref 96–112)
Creatinine, Ser: 0.98 mg/dL (ref 0.4–1.2)
GFR calc Af Amer: >60 mL/min (ref >60)
GFR calc non Af Amer: 55 mL/min — ABNORMAL LOW (ref >60)
Glucose, Bld: 126 mg/dL — ABNORMAL HIGH (ref 70–99)
Potassium: 4.1 mEq/L (ref 3.5–5.1)
Sodium: 141 mEq/L (ref 135–145)

## 2011-03-31 LAB — URINE CULTURE: Colony Count: 2000

## 2011-03-31 LAB — ABO/RH: ABO/RH(D): O POS

## 2011-03-31 LAB — TYPE AND SCREEN
ABO/RH(D): O POS
Antibody Screen: NEGATIVE

## 2011-04-01 LAB — DIFFERENTIAL
Basophils Absolute: 0.1 10*3/uL (ref 0.0–0.1)
Basophils Relative: 1 % (ref 0–1)
Eosinophils Absolute: 0.1 10*3/uL (ref 0.0–0.7)
Eosinophils Relative: 2 % (ref 0–5)
Lymphocytes Relative: 20 % (ref 12–46)
Lymphs Abs: 1.6 10*3/uL (ref 0.7–4.0)
Monocytes Absolute: 0.8 10*3/uL (ref 0.1–1.0)
Monocytes Relative: 10 % (ref 3–12)
Neutro Abs: 5.7 10*3/uL (ref 1.7–7.7)
Neutrophils Relative %: 68 % (ref 43–77)

## 2011-04-01 LAB — URINALYSIS, ROUTINE W REFLEX MICROSCOPIC
Bilirubin Urine: NEGATIVE
Glucose, UA: NEGATIVE mg/dL
Hgb urine dipstick: NEGATIVE
Ketones, ur: NEGATIVE mg/dL
Nitrite: POSITIVE — AB
Protein, ur: NEGATIVE mg/dL
Specific Gravity, Urine: 1.01 (ref 1.005–1.030)
Urobilinogen, UA: 0.2 mg/dL (ref 0.0–1.0)
pH: 6 (ref 5.0–8.0)

## 2011-04-01 LAB — CBC
HCT: 41.1 % (ref 36.0–46.0)
Hemoglobin: 14.4 g/dL (ref 12.0–15.0)
MCHC: 34.9 g/dL (ref 30.0–36.0)
MCV: 92.1 fL (ref 78.0–100.0)
Platelets: 240 10*3/uL (ref 150–400)
RBC: 4.47 MIL/uL (ref 3.87–5.11)
RDW: 13.1 % (ref 11.5–15.5)
WBC: 8.3 10*3/uL (ref 4.0–10.5)

## 2011-04-01 LAB — BASIC METABOLIC PANEL
BUN: 16 mg/dL (ref 6–23)
CO2: 31 mEq/L (ref 19–32)
Calcium: 9.8 mg/dL (ref 8.4–10.5)
Chloride: 98 mEq/L (ref 96–112)
Creatinine, Ser: 0.75 mg/dL (ref 0.4–1.2)
GFR calc Af Amer: >60 mL/min (ref >60)
GFR calc non Af Amer: >60 mL/min (ref >60)
Glucose, Bld: 133 mg/dL — ABNORMAL HIGH (ref 70–99)
Potassium: 3.8 mEq/L (ref 3.5–5.1)
Sodium: 137 mEq/L (ref 135–145)

## 2011-04-01 LAB — URINE MICROSCOPIC-ADD ON

## 2011-05-08 NOTE — Op Note (Signed)
[name], [name]:  [id number]   MEDICAL RECORD NO.:  [id number]          PATIENT TYPE:  INP   LOCATION:  3022                         FACILITY:  [facility]   PHYSICIAN:  Danae Orleans. Venetia Maxon, M.D.  DATE OF BIRTH:  December 07, 1929   DATE OF PROCEDURE:  08/19/2009  DATE OF DISCHARGE:                               OPERATIVE REPORT   PREOPERATIVE DIAGNOSES:  Scoliosis, spondylosis degenerative disk  disease, stenosis, lumbar radiculopathy at L4-5 level.   POSTOPERATIVE DIAGNOSIS:  Scoliosis, spondylosis degenerative disk  disease, stenosis, lumbar radiculopathy at L4-5 level.   PROCEDURE:  Anterolateral decompression and fusion at L4-5 with PEEK  interbody cage (eight lordotic) and lateral plate and screws at L4-L5  level.   SURGEON:  Danae Orleans. Venetia Maxon, MD   ASSISTANTS:  Hewitt Shorts, MD and Georgiann Cocker, RN   ANESTHESIA:  General endotracheal anesthesia.   ESTIMATED BLOOD LOSS:  10 mL.   COMPLICATIONS:  None.   DISPOSITION:  To recovery.   INDICATIONS:  [name] is a 54 year old woman with scoliosis,  spondylosis, degenerative disk disease and significant stenosis on the  left at L4-5.  It was elected to take her to surgery after she did not  get continued relief with epidural injections.   PROCEDURE:  [name] was brought to the operating room.  Following  the placement of intravenous lines and Foley catheter, she has had neuro  monitoring with L2, L3, L4, L5 and S1 myotomes.  The patient was placed  in a left lateral decubitus position with right side elevated and was  taped in position with L4-5 being in a true AP projection on C-arm  fluoroscopy and then lateral bed was flexed and positioned so that L4-5  endplates were parallel on a directly lateral radiograph.  The area  overlying the L4-5 interspace was then marked and a posterior incision  was also marked.  She was taped in the usual fashion and soft tissue and  bony prominences were  padded appropriately.  Her area of planned  incision was infiltrated with local lidocaine.  The patient was prepped  and draped in usual sterile fashion.  Previously marked incisions were  then opened and the posterior incision was made and carried bluntly  through fascial band with finger dissection to palpate the transverse  process so as then the retroperitoneal plane was dissected with finger  dissection and the initial introducer was then introduced to the level  of psoas muscle.  Twitch test showed good train-of-four psoas muscle and  then using the gentle downward pressure and neuromonitoring, the  introducer was docked at the disk space of L4-5 just slightly posterior  to the midline of the interspace.  The K-wire was then inserted into the  interspace and its position was confirmed on AP and lateral fluoroscopy.  Using sequential dilators with neuro-monitoring, dilators were placed  and then a 140-mm minimally invasive retractor system was placed and its  positioning was confirmed on AP and lateral fluoroscopy.  It was opened  to clicks in either direction and the neuro-monitoring was then  performed along the posterior aspect where  the shim would be placed.  There was no evidence of any abnormal firing or high spur suggestive of  proximity to nerve elements.  The shim was then placed and its position  was confirmed on AP and lateral fluoroscopy.  The interspace was then  incised.  Disk material was removed in piecemeal fashion.  Cobb curettes  were carefully passed through the interspace for a left-sided release  and additional diskectomy was performed, then the 16-mm paddle  distractor was placed followed by eight lordotic trial implant.  This  appeared to be well-positioned and was under significant tension.  It  was felt that this was appropriately sized and 5 mL of Osteocel  was  then packed within the implant which was positioned across the  interspace to a depth of 55 mm  using the shims appropriately.  The  implant was well positioned on AP and lateral fluoroscopy.  An 8-mm  lateral plate was then affixed to the spine by using 60 mm x 5.5 mm  screws at L5 and then 55 mm screws at L4, it locked down and tightened  appropriately.  8-mm plate was then applied and locked down with locking  screws which were torqued appropriately.  Wound was then inspected and  hemostasis assured.  The retractor was then removed and the incisions  were irrigated and closed with 2-0 and 3-0 Vicryl sutures and Dermabond.  The patient was extubated in the operating room and taken to recovery in  stable satisfactory condition having tolerated the operation well.  All  counts were correct at the end of the case.      Danae Orleans. Venetia Maxon, M.D.  Electronically Signed     JDS/MEDQ  D:  08/19/2009  T:  08/20/2009  Job:  [id number]

## 2011-05-11 NOTE — Op Note (Signed)
[name], [name] NO.:  [id number]   MEDICAL RECORD NO.:  [id number]          PATIENT TYPE:  AMB   LOCATION:  DAY                           FACILITY:  [facility]   PHYSICIAN:  Vickki Hearing, M.D.DATE OF BIRTH:  [birth year]   DATE OF PROCEDURE:  03/23/2005  DATE OF DISCHARGE:                                 OPERATIVE REPORT   HISTORY AND INDICATIONS:  This is a 77 year old female who presented with  lateral joint pain and mechanical symptoms consistent with a lateral  meniscal tear.  Her findings on physical exam were so strong that we went  ahead with arthroscopy of the right knee.   PREOPERATIVE DIAGNOSIS:  Lateral meniscal, tear right knee.   POSTOPERATIVE DIAGNOSES:  Osteoarthritis, loose bodies and lateral meniscal  tear, right knee.   PROCEDURE:  Arthroscopy, removal of loose bodies, joint debridement and  partial lateral meniscectomy.   OPERATIVE FINDINGS:  There were two loose bodies in the lateral compartment.  There were grade 4 changes throughout the knee, especially on the  patellofemoral and lateral side.  The medial side had grade 1 to 2 changes.  There was a large osteophyte in the notch.   SURGEON:  Vickki Hearing, M.D.   ANESTHETIC:  Spinal by Dr. Tollie Eth.   COMPLICATIONS:  None.   BLOOD LOSS:  Minimal.   TOURNIQUET TIME:  None.   SPECIMENS:  None.   Counts were correct. The patient went to recovery room in stable condition.   DETAILS OF PROCEDURE:  [name] was identified in the preop holding  area as [name].  Her history and physical was updated.  She  marked her right knee as the operative site, and then I signed my initials.  She was given a preoperative antibiotic and taken to the operating room,  where a spinal anesthetic was administered.  She was then placed supine on  the operating table and her right knee was prepped and draped using sterile  technique.  At this point a time-out was taken and the  patient was confirmed  to be [name], operative site was confirmed as the right knee,  procedure as a right knee arthroscopy, antibiotics were confirmed to be  given with an hour of skin incision, and all equipment needed for surgery  was present.   We used a two-incision diagnostic arthroscopy technique.  We reviewed the  entire knee by probing and visualization.  Findings are as stated.  Through  a medial portal, a debridement of the lateral compartment including a  partial lateral meniscectomy mainly involving the free edges of the lateral  meniscus was performed.  The loose bodies were removed with a grasper.  The  notch area was debrided of soft tissue.  The medial meniscus was probed,  found to be intact.  There were some mild grade 1 to two changes on the  medial femoral condyle, which were debrided, and then a debridement was  performed of the patellofemoral compartment, where severe grade 4 changes  were noted.   The knee was irrigated.  We suctioned out any loose debris, washed the  knee  with copious amounts of fluid, closed the lateral portal with a 3-0 nylon,  medial portal with Steri-Strips, injected 30 mL of Marcaine and then used  sterile dressings to cover the knee along with the Cryo/Cuff, and we took  the patient to recovery room in stable condition.  Postop plan is for full  weightbearing, physical therapy and a follow-up in 2 days.      SEH/MEDQ  D:  03/23/2005  T:  03/23/2005  Job:  [id number]

## 2011-05-11 NOTE — Op Note (Signed)
[name], [name]                     ACCOUNT NO.:  [id number]   MEDICAL RECORD NO.:  [id number]                   PATIENT TYPE:  AMB   LOCATION:  DSC                                  FACILITY:  [facility]   PHYSICIAN:  Rodney A. Chaney Malling, M.D.           DATE OF BIRTH:  Oct 14, 1929   DATE OF PROCEDURE:  06/29/2003  DATE OF DISCHARGE:                                 OPERATIVE REPORT   PREOPERATIVE DIAGNOSES:  1. Loose body, right shoulder.  2. Early osteoarthritis.  3. Rotator cuff tear, right shoulder.   POSTOPERATIVE DIAGNOSES:  1. Loose body, right shoulder.  2. Early osteoarthritis.  3. Rotator cuff tear, right shoulder.   OPERATION:  Diagnostic arthroscopy, removal of loose bodies, right shoulder;  open acromioplasty and open repair of rotator cuff, right shoulder, using  Mitek anchor.   SURGEON:  Lenard Galloway. Chaney Malling, M.D.   ANESTHESIA:  General.   PROCEDURE IN DETAIL:  Patient was placed on the operating table in a semi-  seated position after general endotracheal anesthesia.  The right shoulder  was prepped with Duraprep and draped in the usual manner.  The arthroscope  was introduced into the posterior portal.  Excellent view of the  glenohumeral joint was achieved.  The articular cartilage over the humeral  head and the glenoid __________.  Anterior labrum intact.  The biceps was  intact.  There was fraying of the rotator cuff adjacent to the biceps  tendon.  There was a large loose body which is floating around the shoulder.  An anterior operative portal was then made.  The loose body was then flushed  out of the shoulder.  An extensive examination following this was done in  fear of pouch and recess.  All other parts of the shoulder showed no other  loose bodies.  The arthroscope was then inserted into the subacromial space.  There was a tear of the rotator cuff which is retracted about 1 cm.  The  arthroscope was then removed.   A saber cut incision was  made over the anterolateral aspect of the right  shoulder.  Skin edges were retracted.  Bleeders were coagulated.  Deltoid  fibers were released off the anterior aspect of the acromion only.  Using a  power saw, a NEER anterior 1/3 acromioplasty was then done.  This gave  excellent access to the shoulder joint.  Throughout the procedure, the  shoulder was irrigated with copious amounts of pulsating lavage and saline.  Rotator cuff could clearly be seen.  The bursa was excised.  The area of  attachment of the rotator cuff was debrided with a rongeur since there was  some bleeding bone.  A single Mitek staple was inserted and sutures brought  through the rotator cuff, and the rotator cuff was brought done into an  anatomic position.  An excellent watertight closure was achieved.  There was  good clearance as the shoulder was abducted.  Again the shoulder was  irrigated with copious amounts of saline solution.  The deltoid fibers were  reattached with 0 Vicryl and 2-0 Vicryl was used to close the subcutaneous  tissue.  Stainless steel staples were then used to close the skin.  Sterile  dressings were applied and the patient returned to the recovery room in  excellent condition.    DRAINS:  None.   COMPLICATIONS:  None.                                                Rodney A. Chaney Malling, M.D.    RAM/MEDQ  D:  06/29/2003  T:  06/29/2003  Job:  [id number]

## 2011-05-11 NOTE — H&P (Signed)
[name], [name]           ACCOUNT NO.:  [id number]   MEDICAL RECORD NO.:  [id number]          PATIENT TYPE:  AMB   LOCATION:  DAY                           FACILITY:  [facility]   PHYSICIAN:  Vickki Hearing, M.D.DATE OF BIRTH:  05/24/1929   DATE OF ADMISSION:  DATE OF DISCHARGE:  LH                                HISTORY & PHYSICAL   CHIEF COMPLAINT:  Pain, right knee.   HISTORY:  This is a 77 year old female with a two-week history of pain in  her right knee.  Complains of severe, constant, sharp pain after a twisting  injury, associated with giving way, modified by increased pain with  ambulation.   She reports diabetes, history of cataracts, sinusitis, seasonal allergies,  sinus problems.   REVIEW OF SYSTEMS:  Otherwise, general, heart, breathing, GI, urinary,  neurologic, musculoskeletal, psychiatric, and skin systems normal.   Patient reports an allergy to CODEINE.   Has medical problems of hypertension and diabetes.   Previous surgery on her foot.   MEDICATIONS:  Actos, Uniretic, Glucophage, Glucotrol, Neurontin, Vytorin.   FAMILY HISTORY:  Heart disease, arthritis.   FAMILY PHYSICIAN:  Dr. Regino Schultze, Dr. Nobie Putnam.   Patient is married and works in [employer].  She does not smoke,  drink, or use caffeine.  She has a 12th grade education.   PHYSICAL EXAMINATION:  VITAL SIGNS:  She has a weight of 200, pulse 64,  respiratory rate 18.  GENERAL:  She is normally developed with normal grooming and hygiene.  Nutrition is good.  Body habitus is endomorphic.  She is awake, alert and  oriented x 3.  Sensation is normal.  Pulses are normal.  No venous stasis.  She has normal temperature.  No edema.  Skin is normal.  EXTREMITIES:  She has range of motion of 130 degrees with pain throughout  the arc of 90-130.  She has normal strength and stability to the knee with  tenderness and swelling and lateral compartment pain and tenderness.  Her  opposite extremity  is normal.  Her upper extremities are normal.   Her radiographs show degenerative joint disease, possible loose bodies.  She  has a strong McMurray's sign for lateral meniscal tear.   Based on her history and clinical findings, she has agreed to undergo  arthroscopy of the right knee for lateral meniscal tear.  We may find loose  bodies and arthritis as well.   DIAGNOSIS:  Lateral meniscal tear.   PLAN:  Arthroscopy, right knee, and lateral meniscectomy.      SEH/MEDQ  D:  03/22/2005  T:  03/22/2005  Job:  [id number]

## 2011-05-11 NOTE — Procedures (Signed)
NAMEKELEE, CUNNINGHAM           ACCOUNT NO.:  [id number]   MEDICAL RECORD NO.:  [id number]          PATIENT TYPE:  INP   LOCATION:  A323                          FACILITY:  [facility]   PHYSICIAN:  Dani Gobble, MD       DATE OF BIRTH:  21-Apr-1929   DATE OF PROCEDURE:  05/02/2006  DATE OF DISCHARGE:  05/02/2006                                    STRESS TEST   REFERRING PHYSICIANS:  1.  Dr. Regino Schultze.  2.  Dr. Emelda Fear.   INDICATION:  Ms. Delorse Limber is a 77 year old female with a past medical  history of hypertension, hyperlipidemia and diabetes mellitus, who is  scheduled for surgery for ovarian mass.  She is undergoing an adenosine  Cardiolite and as part of a preoperative evaluation.   ELECTROCARDIOGRAPHIC/HEMODYNAMIC DATA:  Baseline blood pressure 130/68 mmHg  with a pulse of 58 beats per minute.  Baseline 12-lead reveals sinus  bradycardia.  There are no ischemic changes noted.  Adenosine was infused  per protocol.  She had a rare PVC and transient first-degree A-V block,  which resolved by __________ .  No ischemic changes were noted.   IMPRESSION:  1.  EKG negative for ischemic changes.  2.  Scintigraphic images are pending.           ______________________________  Dani Gobble, MD     AB/MEDQ  D:  05/02/2006  T:  05/03/2006  Job:  [id number]   cc:   Kirk Ruths, M.D.  Fax: [phone]   Tilda Burrow, M.D.  Fax: [phone]

## 2011-05-11 NOTE — Discharge Summary (Signed)
NAMEALESSANDRA, SAWDEY NO.:  [id number]   MEDICAL RECORD NO.:  [id number]          PATIENT TYPE:  INP   LOCATION:  A323                          FACILITY:  [facility]   PHYSICIAN:  Tilda Burrow, M.D. DATE OF BIRTH:  1929-06-20   DATE OF ADMISSION:  04/28/2006  DATE OF DISCHARGE:  05/10/2007LH                                 DISCHARGE SUMMARY   ADMISSION DIAGNOSES:  1.  Back pain, severe, incapacitating to the patient.  2.  Type 2 diabetes mellitus.  3.  Hypertension.  4.  Hyperlipidemia.  5.  Peripheral neuropathy.  6.  Intermittent low back pain.   DISCHARGE DIAGNOSES:  1.  Back pain, severe, incapacitating to the patient.  2.  Type 2 diabetes mellitus.  3.  Hypertension.  4.  Hyperlipidemia.  5.  Peripheral neuropathy.  6.  Intermittent low back pain.  7.  Cystic left ovarian masses, likely benign.  8.  Cleared for surgery.   DISCHARGE MEDICATIONS:  1.  Lortab 7.5/650 mg, one p.o. q.4h. p.r.n. pain.  2.  Actos one p.o. daily.  3.  Aspirin discontinued as of May 02, 2006.  4.  Metformin 500 mg p.o. b.i.d.  5.  Glipizide 5 mg p.o. b.i.d.  6.  Lisinopril/hydrochlorothiazide 20/12.5 mg, one p.o. b.i.d.  7.  Gabapentin 600 mg p.o. t.i.d.  8.  Vytorin 10/20 mg, one p.o. daily.  9.  Magnesium citrate one bottle to be taken on Sunday.  The patient is      scheduled for an outpatient admission on May 06, 2006.   HISTORY OF PRESENT ILLNESS:  This 77 year old female was admitted under the  care of Dr. Loraine Leriche [facility] on Apr 28, 2006, after  presenting with back pain and found to be incapacitating to the patient.  The patient was admitted for pain management and evaluation for the etiology  of her progression of her five-day pain episode.  See the EDR for details.   HOSPITAL COURSE:  She was admitted and evaluated extensively as noted in the  HPI and the consultant's notes.  She had an abdominal ultrasound and  identified a cystic  structure in the left adnexa with questionable Doppler  flow identified.  An MRI of the pelvis suggested this to be likely benign.  The CA125 was within normal limits.   Additional evaluation includes a hemoglobin of 13.7, hematocrit 41.3, white  count 8100.  Electrolytes notable for potassium of 4, sodium 137, BUN 18,  creatinine 0.7, calcium 9.7.  CA125 antigen reported normal at 18.7.   Additional workup includes an electrocardiogram showing a normal sinus  rhythm, confirmed as normal.   An abdominal ultrasound of the aorta was normal.  Kidneys were normal on  ultrasound as well.  Lumbar spine five-view films showed degenerative  disease and spondylosis only.   An MRI of the back showed diffuse degenerative spondylitic changes with  degenerative disk disease at multiple spots.  There was possible  encroachment of the L4-5 nerve root and the foramen by bulging disk and  osteophyte with dissipating snare, with similar findings at L4-5 and L2-3.  All  disks were negative for herniated nucleus pulposus.   After completion of a pre-surgical evaluation by cardiology including an  adenosine Myoview study which was interesting in that it showed an  exaggerated ejection fraction of 81%, felt to be a low risk study by Dr. Dani Gobble, the patient was considered stable for surgery.   DISPOSITION:  For scheduling considerations she was given the option of  preparing for surgery at home, and will be discharged at this date for  followup as an outpatient on Monday, May 06, 2006, bowel prep having been  performed as an outpatient, for a laparotomy and removal of both tubes and  ovaries to be performed on May 06, 2006, at [facility].      Tilda Burrow, M.D.  Electronically Signed     JVF/MEDQ  D:  05/02/2006  T:  05/03/2006  Job:  [id number]   cc:   Patrica Duel, M.D.  Fax: [phone]   [facility] - [facility]

## 2011-05-11 NOTE — Consult Note (Signed)
[name], KLASSEN NO.:  [id number]   MEDICAL RECORD NO.:  [id number]          PATIENT TYPE:  INP   LOCATION:  A323                          FACILITY:  [facility]   PHYSICIAN:  Tilda Burrow, M.D. DATE OF BIRTH:  04/01/1929   DATE OF CONSULTATION:  05/02/2006  DATE OF DISCHARGE:                                   CONSULTATION   INDICATION:  Pelvic mass noted on abdominal ultrasound, confirmed via MRI.   HISTORY OF PRESENT ILLNESS:  A 77 year old female postmenopausal, patient of  Dr. Loraine Leriche [facility], [facility], admitted for evaluation of  abdominal pain.  She was seen in the emergency room, Apr 28, 2006, in the  emergency room with a 5 day history of increasing severe localized back  pain.  No abdominal pain, GI, or GU pain.  No radicular pain.  She is  incapacitated to the point of not being able to walk.  She presented to the  emergency room.  Examination by laboratory tests was negative.  X-rays  revealed degenerative joint changes.  She could not handle outpatient  management due to the debilitating degree of intractable pain.  She was  admitted for further evaluation.  Evaluation has been extensive and includes  an MR of the spine which shows multiple level degenerative changes of the  back with multiple disk protrusions, some encroachment on the L5 nerve root  by osteophyte.  Diffuse disk disease but no definite compression of nerve  roots.  Similar findings at L4-5, L5-S1, and L3-4.  The patient had  additional assessment including a transabdominal and transvaginal ultrasound  which showed evidence of a surgically absent uterus but a complex cystic  structure in the left adnexa measuring 6 x 5.1 x 3.2 cm with questions of  internal blood flow on the margin.  This was further interpreted by an MRI  of the pelvis without contrast which showed the left adnexal area to have  actually 2 cystic structures, 1.8 x 1.8 and 3.3 x 3.5 cm without any  lymphadenopathy, fluid, ascites, or tumor cake.  These did not enhance and  were considered to be simple cystic; CA 125 is 18, _which is normal 0-30.1.  Plans are therefore to proceed with surgical clearance with plans for  surgery to remove the _mass to confirm the tissue type.  Further evaluation  has included preoperative evaluation.  Dr. Regino Schultze has ordered a myocardial  perfusion test.  This showed slightly elevated ejection fraction considered  likely to be nonspecific mathematical abnormality.  Dr. Roque Lias review of  this result by phone with me indicates that she considers this a low risk  scan.  She is therefore cleared from a cardiac standpoint for surgery.  After discussion of treatment, the patient is now planned for readmission on  Monday through outpatient surgery for surgery that day after a bowel prep on  Sunday.  She will have laparotomy, removal of both tubes and ovaries to be  performed on May 06, 2006.  Thank you for the opportunity to assist in this  patient's care.      Tilda Burrow, M.D.  Electronically Signed     JVF/MEDQ  D:  05/02/2006  T:  05/03/2006  Job:  [id number]   cc:   Patrica Duel, M.D.  Fax: [phone]

## 2011-05-11 NOTE — Discharge Summary (Signed)
NAMEMARGERY, [name]:  [id number]   MEDICAL RECORD NO.:  [id number]          PATIENT TYPE:  INP   LOCATION:  A428                          FACILITY:  [facility]   PHYSICIAN:  Tilda Burrow, M.D. DATE OF BIRTH:  February 19, 1929   DATE OF ADMISSION:  05/06/2006  DATE OF DISCHARGE:  05/17/2007LH                                 DISCHARGE SUMMARY   ADMISSION DIAGNOSES:  Low back pain, cystic left ovarian mass.   DISCHARGE DIAGNOSES:  Left ovarian serous cystadenoma, right ovarian  cystadenofibroma, back pain improved.   DISCHARGE MEDICATIONS:  1.  Darvocet-N 100 1 p.o. q.6 h p.r.n. pain.  2.  Actos 1 p.o. daily.  3.  Aspirin 81 mg to be resumed in 1 week.  4.  Metformin 500 mg p.o. b.i.d.  5.  Glipizide __________ mg p.o. daily.  6.  Lisinopril-HCTZ 20/12.5 p.o. b.i.d.  7.  Gabapentin 600 mg p.o. t.i.d.  8.  Vytorin 10/20 1 p.o. daily.   FOLLOW UP:  One week [facility] for staple removal. Followup 2 weeks  [facility].   HOSPITAL SUMMARY:  This 77 year old was readmitted for scheduled laparotomy  for a cystic mass in the left adnexa. She had lots of debilitating back pain  of a four week duration. She had had a relatively negative workup other than  the presumably incidental finding of cystic pelvic mass. The pain was more  on the right side than the left even though the cystic structure was on the  left.   HOSPITAL COURSE:  The patient underwent laparotomy which revealed extensive  pelvic adhesions of the sigmoid colon to the pelvic brim. The patient was  status post prior hysterectomy. Current plans were for exploratory  laparotomy/ and bilateral salpingo-oophorectomy. The left ovary was densely  obscured and lots of pressure from the cyst against adjacent structures  including retroperitoneal structures. The right ovary was relatively easy to  remove,though tiny was densely adherent to right pelvic sidewall.  Pathology  returned showing  left serous cystadenoma and right serous cystadenofibroma,  these are nonmalignant tumors.   Her postoperative course was relatively uneventful. She had a postoperative  hemoglobin of 13.0 compared to 15.4 preop. Electrolytes were normal, BUN 18,  creatinine 0.8 preop. After fluid hydration, BUN dropped to 5. Hemoglobin  remained satisfactory at 13.0 postop. Blood type was O positive, no  transfusions were necessary. Discharge May 09, 2006 for staple removal in 6  days at [facility].      Tilda Burrow, M.D.  Electronically Signed     JVF/MEDQ  D:  05/09/2006  T:  05/10/2006  Job:  [id number]   cc:   Patrica Duel, M.D.  Fax: [phone]

## 2011-05-11 NOTE — H&P (Signed)
NAMESHEINA, [name]           ACCOUNT NO.:  [id number]   MEDICAL RECORD NO.:  [id number]          PATIENT TYPE:  INP   LOCATION:  A323                          FACILITY:  [facility]   PHYSICIAN:  Patrica Duel, M.D.    DATE OF BIRTH:  Oct 13, 1929   DATE OF ADMISSION:  04/28/2006  DATE OF DISCHARGE:                                HISTORY & PHYSICAL   CHIEF COMPLAINT:  Low back pain.   HISTORY OF PRESENT ILLNESS:  This is a 77 year old female who has a  longstanding history of diabetes mellitus which has been well controlled.  She is also treated chronically for hypertension and diabetic neuropathy and  hyperlipidemia.  She is status post breast surgery, hysterectomy, shoulder  surgery, foot surgery, and knee surgery.  She underwent a colonoscopy in  August 2006 for an episode of hematochezia which report is currently  unavailable.  She has had no recurrence and apparently the findings were  benign.   The patient also has a history of intermittent back pain and has a  prescription for hydrocodone.  She uses this on occasion only.   The patient presented to the emergency department with a 5-day history of  increasingly severe localized low back pain radiating to the paralumbar  regions.  She has had no abdominal pain, genitourinary, or radicular  symptoms.  She also denies trauma.   The patient presented to the emergency department for evaluation.  Laboratory review was unrevealing.  X-rays apparently revealed degenerative  joint changes only.  The patient was offered home medications, though she  refused to be discharged home as she could not make it.   The patient is admitted with intractable back pain for further evaluation  and therapy as indicated.   There is no history of headache, neurologic deficits, chest pain, shortness  of breath, nausea, vomiting, diarrhea, melena, hematemesis, hematochezia or,  as noted, genitourinary symptoms.   CURRENT MEDICATIONS:  1.  Lortab  7.5/650 p.r.n.  2.  Actos ? 30 mg daily.  3.  Aspirin 81 mg daily.  4.  Metformin 500 b.i.d.  5.  Glipizide 5 daily.  6.  Lisinopril/hydrochlorothiazide 20/12.5 b.i.d.  7.  Neurontin 600 mg t.i.d.  8.  Vytorin 10/20 daily.   ALLERGIES:  CODEINE.   FAMILY HISTORY:  Noncontributory.   REVIEW OF SYSTEMS:  Negative except as mentioned.   SOCIAL HISTORY:  Nonsmoker, nondrinker.  Relatively independent.   PHYSICAL EXAMINATION:  GENERAL:  This is a very pleasant female who is alert  and oriented in no acute distress.  She is comfortable in a prone position.  VITAL SIGNS AT PRESENTATION:  Temperature 97.7, blood pressure 144/73, pulse  79, respirations 20 and unlabored, O2 saturation 99%.  HEENT:  Normocephalic, atraumatic, pupils equal.  Ears, nose, throat are  benign.  NECK:  Supple.  No audible bruits, masses, thyromegaly, or lymphadenopathy  noted.  LUNGS:  Clear to A&P.  HEART:  Heart sounds are normal.  Rate and rhythm are regular.  No murmurs,  rubs, or gallops.  ABDOMEN:  Nontender, nondistended.  BACK:  Reveals moderate paralumbar spasm and tenderness though no  percussion  tenderness is present.  EXTREMITIES:  No clubbing, cyanosis, or edema.  NEUROLOGIC:  Within normal limits.   PERTINENT LABORATORY:  As noted above.  In addition, she had a urinalysis  which revealed 20-30 white cells and bacteria present.   ASSESSMENT:  Low back pain which appears to be musculoskeletal.  Need to  consider other causes such as abdominal aneurysm, though she has no physical  findings compatible with this.   PLAN:  Admit for symptomatic therapy.  Diagnostics to include MRI and  ultrasound of the abdomen.  Physical therapy consult and further evaluation  and therapy as indicated.      Patrica Duel, M.D.  Electronically Signed     MC/MEDQ  D:  04/29/2006  T:  04/29/2006  Job:  [id number]

## 2011-05-11 NOTE — H&P (Signed)
NAMEKELI, [name]           ACCOUNT NO.:  [id number]   MEDICAL RECORD NO.:  [id number]          PATIENT TYPE:  AMB   LOCATION:  DAY                           FACILITY:  [facility]   PHYSICIAN:  Dalia Heading, M.D.  DATE OF BIRTH:  March 03, 1929   DATE OF ADMISSION:  07/30/2005  DATE OF DISCHARGE:  LH                                HISTORY & PHYSICAL   CHIEF COMPLAINT:  Hematochezia.   HISTORY OF PRESENT ILLNESS:  The patient is a 77 year old white female who  was referred for endoscopic evaluation.  She had a colonoscopy for hemoccult  positive stools found on physical examination.  No abdominal pain, weight  loss, nausea, vomiting, diarrhea, constipation, or melena had been noted.  She has not noted blood in her stool.  She has never had a colonoscopy.  There is no family history of colon carcinoma.   PAST MEDICAL HISTORY:  Non-insulin-dependent diabetes mellitus.   PAST SURGICAL HISTORY:  1.  Breast surgery.  2.  Hysterectomy.  3.  Shoulder surgery.  4.  Foot surgery.  5.  Knee surgery.   CURRENT MEDICATIONS:  Uniretic, Vytorin, Actos, Metformin/Glucophage,  glipizide, Neurontin, aspirin, Caltrate.   ALLERGIES:  CODEINE.   REVIEW OF SYSTEMS:  Noncontributory.   PHYSICAL EXAMINATION:  GENERAL:  The patient is a well-developed, well-  nourished, white female in no acute distress.  VITAL SIGNS:  She is afebrile and vital signs are stable.  LUNGS:  Clear to auscultation with equal breath sounds bilaterally.  HEART:  Reveals a regular rate and rhythm without S3, S4, or murmurs.  ABDOMEN:  Soft, nontender, nondistended.  No hepatosplenomegaly or masses  are noted.  RECTAL:  Deferred to the procedure.   IMPRESSION:  Hematochezia.   PLAN:  The patient is scheduled for a colonoscopy on July 30, 2005.  The  risks and benefits of the procedure including bleeding and perforation were  fully explained to the patient, gave informed consent.       MAJ/MEDQ  D:  07/24/2005   T:  07/24/2005  Job:  [id number]   cc:   Dalia Heading, M.D.  [address], [place]  [place]  [place] [address]  Fax: [phone]   [facility]  Fax: [phone]   Kirk Ruths, M.D.  [address]  [place]  [place] [address]  Fax: [phone]

## 2011-05-11 NOTE — Consult Note (Signed)
NAMENETANYA, YAZDANI NO.:  [id number]   MEDICAL RECORD NO.:  [id number]          PATIENT TYPE:  INP   LOCATION:  A323                          FACILITY:  [facility]   PHYSICIAN:  Nanetta Batty, M.D.   DATE OF BIRTH:  1928/12/25   DATE OF CONSULTATION:  05/01/2006  DATE OF DISCHARGE:                                   CONSULTATION   HISTORY:  Ms. Carns is a 77 year old, mildly overweight, married white  female, mother of two, who was admitted to the hospital because of back  pain.  During the course of her workup, she was found to have an ovarian  cyst measuring 6 cm.  She is scheduled for exploratory laparotomy to  surgically excise this under general anesthesia.  We are asked to see her  for preoperative clearance.   PAST MEDICAL HISTORY:  History is significant for hypertension, noninsulin-  requiring diabetes, hyperlipidemia, and family history.  She has never had a  heart attack or stroke.  She denies chest pain or shortness of breath.  She  is otherwise a healthy woman, status post remote hysterectomy.   HOME MEDICATIONS:  Actos, aspirin 81, Metformin, Glipizide, lisinopril  hydrochlorothiazide 20/12.5, gabapentin, Vytorin 10/20.   ALLERGIES:  CODEINE.   REVIEW OF SYSTEMS:  See above.   PHYSICAL EXAMINATION:  VITAL SIGNS:  Blood pressure 127/62, pulse 61.  LUNGS:  Clear.  NECK:  Carotids without bruits.  HEART:  Reveals regular rate and rhythm without murmurs, gallops, or clicks.  ABDOMEN:  Soft, nontender.  Normoactive bowel sounds.  EXTREMITIES:  She has intact pedal pulses.   LABORATORY EXAM:  Hemoglobin 13.7, hematocrit 41, white count 8.1, platelet  count 258.  Sodium 137, potassium 4, chloride 102, CO2 30, glucose 86, BUN  18, creatinine 0.7.  A 12-lead EKG was not performed at this time nor was  chest x-ray.   IMPRESSION:  Ms. Hegg has a 6-cm left adrenal mass and is scheduled for  exploratory lap under general anesthesia.  Given her age  and risk factor  profile, I believe she would benefit from having an adenosine Myoview  perfusion study to risk stratify her prior to her procedure.  I will arrange  to have this performed tomorrow morning with Dr. Ilene Qua in  attendance.  If this is negative, the patient can proceed with her surgical  procedure at low preoperative cardiovascular risk.      Nanetta Batty, M.D.  Electronically Signed     JB/MEDQ  D:  05/01/2006  T:  05/02/2006  Job:  [id number]   cc:   Kirk Ruths, M.D.  Fax: [phone]

## 2011-05-11 NOTE — Op Note (Signed)
[name], WISOR NO.:  [id number]   MEDICAL RECORD NO.:  [id number]          PATIENT TYPE:  INP   LOCATION:  A428                          FACILITY:  [facility]   PHYSICIAN:  Tilda Burrow, M.D. DATE OF BIRTH:  Oct 12, 1929   DATE OF PROCEDURE:  DATE OF DISCHARGE:                                 OPERATIVE REPORT   PREOPERATIVE DIAGNOSIS:  Cystic pelvic mass.   POSTOPERATIVE DIAGNOSIS:  Left ovarian simple cyst.   PROCEDURE PERFORMED:  Laparotomy, bilateral salpingo-oophorectomy, pelvic  washings.   SURGEON:  Tilda Burrow, M.D.   Threasa HeadsAlinda Money.   ANESTHESIA:  General.   COMPLICATIONS:  None.   INDICATIONS:  A 77 year old female with back pain, severe in nature, being  evaluated for the pain, found to have a large simple cystic structure on the  left side, which may or may not be related to her back pain.  She has  extensive degenerative joint changes.  The surgery is both to get tissue  diagnosis on the cystic mass that is felt to be in origin though benign in  likelihood, and also to rule out ovarian etiology to the pain.  Given that  the ovary was under quite a bit of pressure, was densely adherent in a  trapped position between bowel and pelvic sidewall, it is certainly possible  that the cystic structure could be a contributor or a cause to the severe  pain.   FINDINGS:  Extensive adhesions of sigmoid colon overlying pelvic floor, left  ovarian cyst hidden in retroperitoneal space lateral to the bowel.  Normal-  appearing right ovary.   DETAILS OF PROCEDURE:  The patient was taken to the operating room, prepped  and draped for lower abdominal surgery, opened through a midline incision.  Due to the patient's hygiene difficulties in the crease of her panniculus, a  figure-of-eight hourglass ellipse of skin and fatty tissue was removed in  order to improve abdominal wall contours.  This was removed from umbilicus  to mons pubis.  The midline  was opened without difficulty and pelvic  washings obtained.  There were minimal scanty adhesions, minimal scanty  tissues present.  The patient had the pelvis inspected.  The upper abdomen  was explored and the liver edges were smooth.  The omentum was inspected and  visually normal.  The pelvic floor was inspected and the sigmoid colon was  adherent over the pelvic brim, from the hysterectomy site all the way to the  right pelvic sidewall.  We were able to with careful dissection gradually  peel the bowel from the adjacent structures.  This required sharp dissection  along cleavage planes.  The right adnexa was initially more accessible and  we were able to address the right adnexa first.  The small, atrophic  postmenopausal ovary on the right was able to be taken out by freeing it  from the sidewall, going in the retroperitoneum, identifying the ureter by  palpable banjo-string technique, isolating the infundibulopelvic ligament,  crossclamping and cutting and ligating it.  The tube and ovary could be  pulled off the sidewall with the  peritoneal surface taken with it.  Hemostasis was quite good.  The opposite side was treated in a similar  fashion but was technically much more challenging.  The sigmoid colon  completely obscured the pelvic rim on the left side and required gradual  careful dissection.  We could go from below and place a finger around  underneath some of the adhesions and free up some of the sigmoid, and then  we began at the pelvic brim on the left side and gradually peeled the  tissues that way.  We were able to identify the cystic structure behind the  ureter.  With time we were able to isolate and gradually expose the left  cystic structure and identify some old sessile fimbriae beside it.  Retroperitoneum was entered, the ureter identified well out of the surgical  area, again with banjo-string technique with easy, confident identification  of the structure.  IP  ligament was then clamped, cut, and suture ligated,  and the round ligament adhesions that were stuck to the back wall of the  cystic structure were then clamped, cut, and suture ligated as well.  At  this point the specimen could be peeled off the remaining pelvic sidewall  with satisfactory results.  The patient then had good vision of the pelvis,  inspection for hemostasis and confirmation that hemostasis was in place.   We then proceeded with closure of the anterior peritoneum with 2-0 chromic,  followed by 0 Prolene closure of the fascia.  Subcu fatty tissues were  mobilized sufficiently to close potential space with interrupted 2-0 plain  and staple closure of the skin completed the procedure.   ESTIMATED BLOOD LOSS:  100 mL.   ADDENDUM:  The patient's blood pressures were a little bit elevated in the  recovery room, and she will be given IV labetalol as per protocol.      Tilda Burrow, M.D.  Electronically Signed     JVF/MEDQ  D:  05/06/2006  T:  05/07/2006  Job:  [id number]   cc:   [facility]

## 2011-06-19 ENCOUNTER — Other Ambulatory Visit: Payer: Self-pay | Admitting: Neurosurgery

## 2011-06-19 DIAGNOSIS — M545 Low back pain, unspecified: Secondary | ICD-10-CM

## 2011-06-28 ENCOUNTER — Other Ambulatory Visit: Payer: Self-pay

## 2011-06-28 ENCOUNTER — Ambulatory Visit
Admission: RE | Admit: 2011-06-28 | Discharge: 2011-06-28 | Disposition: A | Discharge status: Home / Self Care | Payer: Medicare Other | Source: Ambulatory Visit | Attending: Neurosurgery | Admitting: Neurosurgery

## 2011-06-28 DIAGNOSIS — M545 Low back pain, unspecified: Secondary | ICD-10-CM

## 2011-06-28 MED ORDER — GADOBENATE DIMEGLUMINE 529 MG/ML IV SOLN
20.0000 mL | Freq: Once | INTRAVENOUS | Status: AC | PRN
  Administered 2011-06-28: 20 mL via INTRAVENOUS

## 2012-01-15 DIAGNOSIS — E119 Type 2 diabetes mellitus without complications: Secondary | ICD-10-CM | POA: Diagnosis not present

## 2012-01-15 DIAGNOSIS — E1149 Type 2 diabetes mellitus with other diabetic neurological complication: Secondary | ICD-10-CM | POA: Diagnosis not present

## 2012-02-01 DIAGNOSIS — J209 Acute bronchitis, unspecified: Secondary | ICD-10-CM | POA: Diagnosis not present

## 2012-02-01 DIAGNOSIS — J069 Acute upper respiratory infection, unspecified: Secondary | ICD-10-CM | POA: Diagnosis not present

## 2012-02-01 DIAGNOSIS — I10 Essential (primary) hypertension: Secondary | ICD-10-CM | POA: Diagnosis not present

## 2012-02-01 DIAGNOSIS — E119 Type 2 diabetes mellitus without complications: Secondary | ICD-10-CM | POA: Diagnosis not present

## 2012-03-10 DIAGNOSIS — Z6838 Body mass index (BMI) 38.0-38.9, adult: Secondary | ICD-10-CM | POA: Diagnosis not present

## 2012-03-10 DIAGNOSIS — M25519 Pain in unspecified shoulder: Secondary | ICD-10-CM | POA: Diagnosis not present

## 2012-03-18 ENCOUNTER — Other Ambulatory Visit: Payer: Self-pay | Admitting: Orthopedic Surgery

## 2012-03-18 DIAGNOSIS — M25512 Pain in left shoulder: Secondary | ICD-10-CM

## 2012-03-18 DIAGNOSIS — M75120 Complete rotator cuff tear or rupture of unspecified shoulder, not specified as traumatic: Secondary | ICD-10-CM | POA: Diagnosis not present

## 2012-03-18 DIAGNOSIS — M19019 Primary osteoarthritis, unspecified shoulder: Secondary | ICD-10-CM | POA: Diagnosis not present

## 2012-03-25 ENCOUNTER — Ambulatory Visit
Admission: RE | Admit: 2012-03-25 | Discharge: 2012-03-25 | Disposition: A | Discharge status: Home / Self Care | Payer: Medicare Other | Source: Ambulatory Visit | Attending: Orthopedic Surgery | Admitting: Orthopedic Surgery

## 2012-03-25 DIAGNOSIS — S46819A Strain of other muscles, fascia and tendons at shoulder and upper arm level, unspecified arm, initial encounter: Secondary | ICD-10-CM | POA: Diagnosis not present

## 2012-03-25 DIAGNOSIS — M25519 Pain in unspecified shoulder: Secondary | ICD-10-CM | POA: Diagnosis not present

## 2012-03-25 DIAGNOSIS — M25512 Pain in left shoulder: Secondary | ICD-10-CM

## 2012-03-25 DIAGNOSIS — M75100 Unspecified rotator cuff tear or rupture of unspecified shoulder, not specified as traumatic: Secondary | ICD-10-CM | POA: Diagnosis not present

## 2012-03-25 DIAGNOSIS — IMO0002 Reserved for concepts with insufficient information to code with codable children: Secondary | ICD-10-CM | POA: Diagnosis not present

## 2012-03-31 DIAGNOSIS — M19019 Primary osteoarthritis, unspecified shoulder: Secondary | ICD-10-CM | POA: Diagnosis not present

## 2012-03-31 DIAGNOSIS — M25819 Other specified joint disorders, unspecified shoulder: Secondary | ICD-10-CM | POA: Diagnosis not present

## 2012-04-01 DIAGNOSIS — E119 Type 2 diabetes mellitus without complications: Secondary | ICD-10-CM | POA: Diagnosis not present

## 2012-04-01 DIAGNOSIS — E1149 Type 2 diabetes mellitus with other diabetic neurological complication: Secondary | ICD-10-CM | POA: Diagnosis not present

## 2012-04-21 DIAGNOSIS — M25819 Other specified joint disorders, unspecified shoulder: Secondary | ICD-10-CM | POA: Diagnosis not present

## 2012-04-21 DIAGNOSIS — M19019 Primary osteoarthritis, unspecified shoulder: Secondary | ICD-10-CM | POA: Diagnosis not present

## 2012-05-02 DIAGNOSIS — Z6839 Body mass index (BMI) 39.0-39.9, adult: Secondary | ICD-10-CM | POA: Diagnosis not present

## 2012-05-02 DIAGNOSIS — R350 Frequency of micturition: Secondary | ICD-10-CM | POA: Diagnosis not present

## 2012-05-02 DIAGNOSIS — R3919 Other difficulties with micturition: Secondary | ICD-10-CM | POA: Diagnosis not present

## 2012-05-16 DIAGNOSIS — R350 Frequency of micturition: Secondary | ICD-10-CM | POA: Diagnosis not present

## 2012-06-10 DIAGNOSIS — E119 Type 2 diabetes mellitus without complications: Secondary | ICD-10-CM | POA: Diagnosis not present

## 2012-06-10 DIAGNOSIS — E1149 Type 2 diabetes mellitus with other diabetic neurological complication: Secondary | ICD-10-CM | POA: Diagnosis not present

## 2012-07-15 DIAGNOSIS — N393 Stress incontinence (female) (male): Secondary | ICD-10-CM | POA: Diagnosis not present

## 2012-07-29 DIAGNOSIS — H1030 Unspecified acute conjunctivitis, unspecified eye: Secondary | ICD-10-CM | POA: Diagnosis not present

## 2012-07-29 DIAGNOSIS — E119 Type 2 diabetes mellitus without complications: Secondary | ICD-10-CM | POA: Diagnosis not present

## 2012-08-19 DIAGNOSIS — N393 Stress incontinence (female) (male): Secondary | ICD-10-CM | POA: Diagnosis not present

## 2012-08-26 DIAGNOSIS — N3946 Mixed incontinence: Secondary | ICD-10-CM | POA: Diagnosis not present

## 2012-09-23 DIAGNOSIS — E119 Type 2 diabetes mellitus without complications: Secondary | ICD-10-CM | POA: Diagnosis not present

## 2012-09-23 DIAGNOSIS — E1149 Type 2 diabetes mellitus with other diabetic neurological complication: Secondary | ICD-10-CM | POA: Diagnosis not present

## 2012-09-29 DIAGNOSIS — Z713 Dietary counseling and surveillance: Secondary | ICD-10-CM | POA: Diagnosis not present

## 2012-09-29 DIAGNOSIS — I10 Essential (primary) hypertension: Secondary | ICD-10-CM | POA: Diagnosis not present

## 2012-09-29 DIAGNOSIS — E1129 Type 2 diabetes mellitus with other diabetic kidney complication: Secondary | ICD-10-CM | POA: Diagnosis not present

## 2012-09-29 DIAGNOSIS — Z6838 Body mass index (BMI) 38.0-38.9, adult: Secondary | ICD-10-CM | POA: Diagnosis not present

## 2012-09-29 DIAGNOSIS — N182 Chronic kidney disease, stage 2 (mild): Secondary | ICD-10-CM | POA: Diagnosis not present

## 2012-11-27 DIAGNOSIS — Z23 Encounter for immunization: Secondary | ICD-10-CM | POA: Diagnosis not present

## 2012-12-11 DIAGNOSIS — E119 Type 2 diabetes mellitus without complications: Secondary | ICD-10-CM | POA: Diagnosis not present

## 2012-12-11 DIAGNOSIS — E1149 Type 2 diabetes mellitus with other diabetic neurological complication: Secondary | ICD-10-CM | POA: Diagnosis not present

## 2012-12-30 DIAGNOSIS — J069 Acute upper respiratory infection, unspecified: Secondary | ICD-10-CM | POA: Diagnosis not present

## 2012-12-30 DIAGNOSIS — Z6838 Body mass index (BMI) 38.0-38.9, adult: Secondary | ICD-10-CM | POA: Diagnosis not present

## 2012-12-30 DIAGNOSIS — J209 Acute bronchitis, unspecified: Secondary | ICD-10-CM | POA: Diagnosis not present

## 2013-01-19 DIAGNOSIS — R928 Other abnormal and inconclusive findings on diagnostic imaging of breast: Secondary | ICD-10-CM | POA: Diagnosis not present

## 2013-01-19 DIAGNOSIS — Z1231 Encounter for screening mammogram for malignant neoplasm of breast: Secondary | ICD-10-CM | POA: Diagnosis not present

## 2013-02-19 DIAGNOSIS — E119 Type 2 diabetes mellitus without complications: Secondary | ICD-10-CM | POA: Diagnosis not present

## 2013-02-19 DIAGNOSIS — E1149 Type 2 diabetes mellitus with other diabetic neurological complication: Secondary | ICD-10-CM | POA: Diagnosis not present

## 2013-03-13 ENCOUNTER — Emergency Department (HOSPITAL_COMMUNITY): Payer: Medicare Other

## 2013-03-13 ENCOUNTER — Emergency Department (HOSPITAL_COMMUNITY)
Admission: EM | Admit: 2013-03-13 | Discharge: 2013-03-13 | Disposition: A | Discharge status: Home / Self Care | Payer: Medicare Other | Attending: Emergency Medicine | Admitting: Emergency Medicine

## 2013-03-13 ENCOUNTER — Encounter (HOSPITAL_COMMUNITY): Payer: Self-pay | Admitting: *Deleted

## 2013-03-13 DIAGNOSIS — Y9289 Other specified places as the place of occurrence of the external cause: Secondary | ICD-10-CM | POA: Insufficient documentation

## 2013-03-13 DIAGNOSIS — Z79899 Other long term (current) drug therapy: Secondary | ICD-10-CM | POA: Diagnosis not present

## 2013-03-13 DIAGNOSIS — S0003XA Contusion of scalp, initial encounter: Secondary | ICD-10-CM | POA: Diagnosis not present

## 2013-03-13 DIAGNOSIS — S0083XA Contusion of other part of head, initial encounter: Secondary | ICD-10-CM | POA: Diagnosis not present

## 2013-03-13 DIAGNOSIS — S6990XA Unspecified injury of unspecified wrist, hand and finger(s), initial encounter: Secondary | ICD-10-CM | POA: Diagnosis not present

## 2013-03-13 DIAGNOSIS — S79929A Unspecified injury of unspecified thigh, initial encounter: Secondary | ICD-10-CM | POA: Insufficient documentation

## 2013-03-13 DIAGNOSIS — E119 Type 2 diabetes mellitus without complications: Secondary | ICD-10-CM | POA: Diagnosis not present

## 2013-03-13 DIAGNOSIS — Y9301 Activity, walking, marching and hiking: Secondary | ICD-10-CM | POA: Insufficient documentation

## 2013-03-13 DIAGNOSIS — W1789XA Other fall from one level to another, initial encounter: Secondary | ICD-10-CM | POA: Insufficient documentation

## 2013-03-13 DIAGNOSIS — I10 Essential (primary) hypertension: Secondary | ICD-10-CM | POA: Insufficient documentation

## 2013-03-13 DIAGNOSIS — S1093XA Contusion of unspecified part of neck, initial encounter: Secondary | ICD-10-CM | POA: Diagnosis not present

## 2013-03-13 DIAGNOSIS — S199XXA Unspecified injury of neck, initial encounter: Secondary | ICD-10-CM | POA: Diagnosis not present

## 2013-03-13 DIAGNOSIS — Y990 Civilian activity done for income or pay: Secondary | ICD-10-CM | POA: Insufficient documentation

## 2013-03-13 DIAGNOSIS — S79919A Unspecified injury of unspecified hip, initial encounter: Secondary | ICD-10-CM | POA: Insufficient documentation

## 2013-03-13 DIAGNOSIS — S0993XA Unspecified injury of face, initial encounter: Secondary | ICD-10-CM | POA: Diagnosis not present

## 2013-03-13 HISTORY — DX: Essential (primary) hypertension: I10

## 2013-03-13 HISTORY — DX: Type 2 diabetes mellitus without complications: E11.9

## 2013-03-13 NOTE — ED Provider Notes (Signed)
History    This chart was scribed for [name] Lennert, MD, by [name], ED scribe. The patient was seen in room APA03/APA03 and the patient's care was started at 1520.    CSN: [id number]  Arrival date & time 03/13/13  1518   First MD Initiated Contact with Patient 03/13/13 1520      Chief Complaint  Patient presents with  . Fall    (Consider location/radiation/quality/duration/timing/severity/associated sxs/prior treatment) Patient is a 79 y.o. female presenting with fall. The history is provided by the patient. No language interpreter was used.  Fall The accident occurred less than 1 hour ago. The fall occurred while walking. She fell from an unknown height. She landed on concrete. The volume of blood lost was minimal. The point of impact was the head. The pain is at a severity of 8/10. There was no entrapment after the fall. There was no drug use involved in the accident. There was no alcohol use involved in the accident. Associated symptoms include headaches. Pertinent negatives include no abdominal pain and no hematuria. She has tried nothing for the symptoms.    [name] is a 79 y.o. female who presents to the Emergency Department complaining of sudden onset, constant headache with associated left hand and right hip pain that began after she fell while walking in a parking lot at 1500. She reports that she hit her head when she fell, but denies any LOC. She has a h/o of hypertension and takes 81 mg of aspirin daily.   PCP is [name].  Past Medical History  Diagnosis Date  . Hypertension   . Diabetes mellitus without complication     Past Surgical History  Procedure Laterality Date  . Abdominal hysterectomy    . Appendectomy    . Breast surgery      begnin tumor removed    No family history on file.  History  Substance Use Topics  . Smoking status: Never Smoker   . Smokeless tobacco: Not on file  . Alcohol Use: No    OB History   Grav Para Term  Preterm Abortions TAB SAB Ect Mult Living                  Review of Systems  Constitutional: Negative for fatigue.  HENT: Negative for congestion, sinus pressure and ear discharge.   Eyes: Negative for discharge.  Respiratory: Negative for cough.   Cardiovascular: Negative for chest pain.  Gastrointestinal: Negative for abdominal pain and diarrhea.  Genitourinary: Negative for frequency and hematuria.  Musculoskeletal: Negative for back pain.       Hip and hand pain  Skin: Negative for rash.  Neurological: Positive for headaches. Negative for seizures.  Psychiatric/Behavioral: Negative for hallucinations.  All other systems reviewed and are negative.    Allergies  Codeine  Home Medications   Current Outpatient Rx  Name  Route  Sig  Dispense  Refill  . ALPRAZolam (XANAX) 1 MG tablet   Oral   Take 1 mg by mouth 2 (two) times daily.         . furosemide (LASIX) 20 MG tablet   Oral   Take 20 mg by mouth daily.         [name] gabapentin (NEURONTIN) 600 MG tablet   Oral   Take 600 mg by mouth 3 (three) times daily.         [name] glipiZIDE (GLUCOTROL) 5 MG tablet   Oral   Take 5 mg by  mouth daily.         [name] lisinopril-hydrochlorothiazide (PRINZIDE,ZESTORETIC) 20-12.5 MG per tablet   Oral   Take 1 tablet by mouth 2 (two) times daily.         . metoprolol succinate (TOPROL-XL) 50 MG 24 hr tablet   Oral   Take 50 mg by mouth daily. Take with or immediately following a meal.         . nystatin-triamcinolone (MYCOLOG II) cream   Topical   Apply 1 application topically 2 (two) times daily.         . simvastatin (ZOCOR) 20 MG tablet   Oral   Take 20 mg by mouth every evening.         . sitaGLIPtan-metformin (JANUMET) 50-500 MG per tablet   Oral   Take 1 tablet by mouth 2 (two) times daily with a meal.         . traMADol (ULTRAM) 50 MG tablet   Oral   Take 50 mg by mouth 4 (four) times daily as needed for pain.         [name] zolpidem (AMBIEN) 10 MG  tablet   Oral   Take 10 mg by mouth at bedtime as needed for sleep.           BP 124/105  Pulse 55  Temp(Src) 98 F (36.7 C) (Oral)  Resp 20  Ht 5\' 10"  (1.778 m)  Wt 180 lb (81.647 kg)  BMI 25.83 kg/m2  SpO2 99%  Physical Exam  Constitutional: She is oriented to person, place, and time. She appears well-developed.  HENT:  Head: Normocephalic and atraumatic.  There is a a small abrasion and contusion to the right forehead with tenderness throughout.   Eyes: Conjunctivae and EOM are normal. No scleral icterus.  Neck: Neck supple. No thyromegaly present.  Cardiovascular: Normal rate and regular rhythm.  Exam reveals no gallop and no friction rub.   No murmur heard. Pulmonary/Chest: No stridor. She has no wheezes. She has no rales. She exhibits no tenderness.  Abdominal: She exhibits no distension. There is no tenderness. There is no rebound.  Musculoskeletal: Normal range of motion. She exhibits no edema.  Lymphadenopathy:    She has no cervical adenopathy.  Neurological: She is oriented to person, place, and time. Coordination normal.  Skin: No rash noted. No erythema.  There is a small abrasion to her left hand.   Psychiatric: She has a normal mood and affect. Her behavior is normal.    ED Course  Procedures (including critical care time)  DIAGNOSTIC STUDIES: Oxygen Saturation is 99% on room air, normal by my interpretation.    COORDINATION OF CARE:  15:35- Discussed planned course of treatment with the patient, including head CT without contrast and cervical spine CT without contrast, who is agreeable at this time.  Labs Reviewed - No data to display Ct Head Wo Contrast  03/13/2013  *RADIOLOGY REPORT*  Clinical Data:  Larey Seat hitting right forehead.  Diabetic hypertensive patient.  CT HEAD WITHOUT CONTRAST CT CERVICAL SPINE WITHOUT CONTRAST  Technique:  Multidetector CT imaging of the head and cervical spine was performed following the standard protocol without  intravenous contrast.  Multiplanar CT image reconstructions of the cervical spine were also generated.  Comparison:   None  CT HEAD  Findings: Right frontal/supraorbital subcutaneous hematoma without underlying fracture or intracranial hemorrhage.  Global atrophy without hydrocephalus.  No CT evidence of large acute infarct.  Vascular calcifications.  No intracranial mass lesion detected  on this unenhanced exam.  Mastoid air cells, visualized paranasal sinuses and middle ear cavities are clear.  Visualized orbital structures unremarkable.  IMPRESSION: Right frontal/supraorbital subcutaneous hematoma without underlying fracture or intracranial hemorrhage.  CT CERVICAL SPINE  Findings: No cervical spine fracture. Mild straightening/slight reversal normal cervical reduces may be related to head positioning or spasm.  Cervical spondylotic changes with various degrees spinal stenosis and foraminal narrowing.  Transverse ligament hypertrophy.  IMPRESSION: No cervical spine fracture.  Please see above.   Original Report Authenticated By: Lacy Duverney, M.D.    Ct Cervical Spine Wo Contrast  03/13/2013  *RADIOLOGY REPORT*  Clinical Data:  Larey Seat hitting right forehead.  Diabetic hypertensive patient.  CT HEAD WITHOUT CONTRAST CT CERVICAL SPINE WITHOUT CONTRAST  Technique:  Multidetector CT imaging of the head and cervical spine was performed following the standard protocol without intravenous contrast.  Multiplanar CT image reconstructions of the cervical spine were also generated.  Comparison:   None  CT HEAD  Findings: Right frontal/supraorbital subcutaneous hematoma without underlying fracture or intracranial hemorrhage.  Global atrophy without hydrocephalus.  No CT evidence of large acute infarct.  Vascular calcifications.  No intracranial mass lesion detected on this unenhanced exam.  Mastoid air cells, visualized paranasal sinuses and middle ear cavities are clear.  Visualized orbital structures unremarkable.   IMPRESSION: Right frontal/supraorbital subcutaneous hematoma without underlying fracture or intracranial hemorrhage.  CT CERVICAL SPINE  Findings: No cervical spine fracture. Mild straightening/slight reversal normal cervical reduces may be related to head positioning or spasm.  Cervical spondylotic changes with various degrees spinal stenosis and foraminal narrowing.  Transverse ligament hypertrophy.  IMPRESSION: No cervical spine fracture.  Please see above.   Original Report Authenticated By: Lacy Duverney, M.D.    No diagnosis found.    MDM    The chart was scribed for me under my direct supervision.  I personally performed the history, physical, and medical decision making and all procedures in the evaluation of this patient.[name] Lennert, MD 03/13/13 [phone]

## 2013-03-13 NOTE — ED Notes (Signed)
Patient with no complaints at this time. Respirations even and unlabored. Skin warm/dry. Discharge instructions reviewed with patient at this time. Patient given opportunity to voice concerns/ask questions. Patient discharged at this time and left Emergency Department with steady gait.   

## 2013-03-13 NOTE — ED Notes (Signed)
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  Hit head, denies loc.  C/o pain to head, left hand, and right hip.

## 2013-04-06 DIAGNOSIS — R5381 Other malaise: Secondary | ICD-10-CM | POA: Diagnosis not present

## 2013-04-06 DIAGNOSIS — E669 Obesity, unspecified: Secondary | ICD-10-CM | POA: Diagnosis not present

## 2013-04-06 DIAGNOSIS — R5383 Other fatigue: Secondary | ICD-10-CM | POA: Diagnosis not present

## 2013-04-06 DIAGNOSIS — I10 Essential (primary) hypertension: Secondary | ICD-10-CM | POA: Diagnosis not present

## 2013-04-06 DIAGNOSIS — Z6837 Body mass index (BMI) 37.0-37.9, adult: Secondary | ICD-10-CM | POA: Diagnosis not present

## 2013-04-06 DIAGNOSIS — E1129 Type 2 diabetes mellitus with other diabetic kidney complication: Secondary | ICD-10-CM | POA: Diagnosis not present

## 2013-04-06 DIAGNOSIS — E1165 Type 2 diabetes mellitus with hyperglycemia: Secondary | ICD-10-CM | POA: Diagnosis not present

## 2013-04-06 DIAGNOSIS — E785 Hyperlipidemia, unspecified: Secondary | ICD-10-CM | POA: Diagnosis not present

## 2013-04-30 DIAGNOSIS — E1149 Type 2 diabetes mellitus with other diabetic neurological complication: Secondary | ICD-10-CM | POA: Diagnosis not present

## 2013-04-30 DIAGNOSIS — E119 Type 2 diabetes mellitus without complications: Secondary | ICD-10-CM | POA: Diagnosis not present

## 2013-07-16 DIAGNOSIS — E119 Type 2 diabetes mellitus without complications: Secondary | ICD-10-CM | POA: Diagnosis not present

## 2013-07-16 DIAGNOSIS — E1149 Type 2 diabetes mellitus with other diabetic neurological complication: Secondary | ICD-10-CM | POA: Diagnosis not present

## 2013-09-16 DIAGNOSIS — E669 Obesity, unspecified: Secondary | ICD-10-CM | POA: Diagnosis not present

## 2013-09-16 DIAGNOSIS — E1129 Type 2 diabetes mellitus with other diabetic kidney complication: Secondary | ICD-10-CM | POA: Diagnosis not present

## 2013-09-16 DIAGNOSIS — G4700 Insomnia, unspecified: Secondary | ICD-10-CM | POA: Diagnosis not present

## 2013-09-16 DIAGNOSIS — Z6838 Body mass index (BMI) 38.0-38.9, adult: Secondary | ICD-10-CM | POA: Diagnosis not present

## 2013-09-16 DIAGNOSIS — E1165 Type 2 diabetes mellitus with hyperglycemia: Secondary | ICD-10-CM | POA: Diagnosis not present

## 2013-09-16 DIAGNOSIS — I10 Essential (primary) hypertension: Secondary | ICD-10-CM | POA: Diagnosis not present

## 2013-09-24 DIAGNOSIS — L609 Nail disorder, unspecified: Secondary | ICD-10-CM | POA: Diagnosis not present

## 2013-09-24 DIAGNOSIS — E119 Type 2 diabetes mellitus without complications: Secondary | ICD-10-CM | POA: Diagnosis not present

## 2013-09-24 DIAGNOSIS — E1149 Type 2 diabetes mellitus with other diabetic neurological complication: Secondary | ICD-10-CM | POA: Diagnosis not present

## 2013-09-24 DIAGNOSIS — E1159 Type 2 diabetes mellitus with other circulatory complications: Secondary | ICD-10-CM | POA: Diagnosis not present

## 2013-09-24 DIAGNOSIS — L851 Acquired keratosis [keratoderma] palmaris et plantaris: Secondary | ICD-10-CM | POA: Diagnosis not present

## 2013-12-03 DIAGNOSIS — E1149 Type 2 diabetes mellitus with other diabetic neurological complication: Secondary | ICD-10-CM | POA: Diagnosis not present

## 2013-12-03 DIAGNOSIS — E119 Type 2 diabetes mellitus without complications: Secondary | ICD-10-CM | POA: Diagnosis not present

## 2013-12-11 DIAGNOSIS — E1165 Type 2 diabetes mellitus with hyperglycemia: Secondary | ICD-10-CM | POA: Diagnosis not present

## 2013-12-11 DIAGNOSIS — E1129 Type 2 diabetes mellitus with other diabetic kidney complication: Secondary | ICD-10-CM | POA: Diagnosis not present

## 2013-12-11 DIAGNOSIS — Z6836 Body mass index (BMI) 36.0-36.9, adult: Secondary | ICD-10-CM | POA: Diagnosis not present

## 2013-12-11 DIAGNOSIS — N183 Chronic kidney disease, stage 3 unspecified: Secondary | ICD-10-CM | POA: Diagnosis not present

## 2013-12-11 DIAGNOSIS — Z23 Encounter for immunization: Secondary | ICD-10-CM | POA: Diagnosis not present

## 2013-12-11 DIAGNOSIS — I10 Essential (primary) hypertension: Secondary | ICD-10-CM | POA: Diagnosis not present

## 2014-01-20 DIAGNOSIS — J019 Acute sinusitis, unspecified: Secondary | ICD-10-CM | POA: Diagnosis not present

## 2014-01-20 DIAGNOSIS — Z6835 Body mass index (BMI) 35.0-35.9, adult: Secondary | ICD-10-CM | POA: Diagnosis not present

## 2014-03-17 DIAGNOSIS — E119 Type 2 diabetes mellitus without complications: Secondary | ICD-10-CM | POA: Diagnosis not present

## 2014-03-18 DIAGNOSIS — E119 Type 2 diabetes mellitus without complications: Secondary | ICD-10-CM | POA: Diagnosis not present

## 2014-03-18 DIAGNOSIS — E1159 Type 2 diabetes mellitus with other circulatory complications: Secondary | ICD-10-CM | POA: Diagnosis not present

## 2014-05-11 DIAGNOSIS — G8929 Other chronic pain: Secondary | ICD-10-CM | POA: Diagnosis not present

## 2014-05-11 DIAGNOSIS — Z6835 Body mass index (BMI) 35.0-35.9, adult: Secondary | ICD-10-CM | POA: Diagnosis not present

## 2014-05-11 DIAGNOSIS — IMO0001 Reserved for inherently not codable concepts without codable children: Secondary | ICD-10-CM | POA: Diagnosis not present

## 2014-05-11 DIAGNOSIS — G4700 Insomnia, unspecified: Secondary | ICD-10-CM | POA: Diagnosis not present

## 2014-06-03 DIAGNOSIS — E119 Type 2 diabetes mellitus without complications: Secondary | ICD-10-CM | POA: Diagnosis not present

## 2014-06-03 DIAGNOSIS — E1159 Type 2 diabetes mellitus with other circulatory complications: Secondary | ICD-10-CM | POA: Diagnosis not present

## 2014-06-30 DIAGNOSIS — Z1231 Encounter for screening mammogram for malignant neoplasm of breast: Secondary | ICD-10-CM | POA: Diagnosis not present

## 2014-07-14 DIAGNOSIS — R82998 Other abnormal findings in urine: Secondary | ICD-10-CM | POA: Diagnosis not present

## 2014-07-14 DIAGNOSIS — N3946 Mixed incontinence: Secondary | ICD-10-CM | POA: Diagnosis not present

## 2014-08-04 DIAGNOSIS — H1030 Unspecified acute conjunctivitis, unspecified eye: Secondary | ICD-10-CM | POA: Diagnosis not present

## 2014-08-17 DIAGNOSIS — E1149 Type 2 diabetes mellitus with other diabetic neurological complication: Secondary | ICD-10-CM | POA: Diagnosis not present

## 2014-08-17 DIAGNOSIS — L851 Acquired keratosis [keratoderma] palmaris et plantaris: Secondary | ICD-10-CM | POA: Diagnosis not present

## 2014-08-17 DIAGNOSIS — B351 Tinea unguium: Secondary | ICD-10-CM | POA: Diagnosis not present

## 2014-09-21 DIAGNOSIS — E785 Hyperlipidemia, unspecified: Secondary | ICD-10-CM | POA: Diagnosis not present

## 2014-09-21 DIAGNOSIS — Z6836 Body mass index (BMI) 36.0-36.9, adult: Secondary | ICD-10-CM | POA: Diagnosis not present

## 2014-09-21 DIAGNOSIS — I10 Essential (primary) hypertension: Secondary | ICD-10-CM | POA: Diagnosis not present

## 2014-09-21 DIAGNOSIS — IMO0001 Reserved for inherently not codable concepts without codable children: Secondary | ICD-10-CM | POA: Diagnosis not present

## 2014-10-13 ENCOUNTER — Encounter (HOSPITAL_COMMUNITY): Payer: Self-pay | Admitting: Emergency Medicine

## 2014-10-13 ENCOUNTER — Emergency Department (HOSPITAL_COMMUNITY)
Admission: EM | Admit: 2014-10-13 | Discharge: 2014-10-13 | Disposition: A | Discharge status: Home / Self Care | Payer: Medicare Other | Attending: Emergency Medicine | Admitting: Emergency Medicine

## 2014-10-13 ENCOUNTER — Emergency Department (HOSPITAL_COMMUNITY): Payer: Medicare Other

## 2014-10-13 DIAGNOSIS — I10 Essential (primary) hypertension: Secondary | ICD-10-CM | POA: Diagnosis not present

## 2014-10-13 DIAGNOSIS — Z79899 Other long term (current) drug therapy: Secondary | ICD-10-CM | POA: Diagnosis not present

## 2014-10-13 DIAGNOSIS — R519 Headache, unspecified: Secondary | ICD-10-CM

## 2014-10-13 DIAGNOSIS — E1165 Type 2 diabetes mellitus with hyperglycemia: Secondary | ICD-10-CM | POA: Diagnosis not present

## 2014-10-13 DIAGNOSIS — R51 Headache: Secondary | ICD-10-CM | POA: Diagnosis not present

## 2014-10-13 DIAGNOSIS — R739 Hyperglycemia, unspecified: Secondary | ICD-10-CM

## 2014-10-13 DIAGNOSIS — E119 Type 2 diabetes mellitus without complications: Secondary | ICD-10-CM | POA: Diagnosis not present

## 2014-10-13 DIAGNOSIS — R112 Nausea with vomiting, unspecified: Secondary | ICD-10-CM

## 2014-10-13 LAB — BASIC METABOLIC PANEL
Anion gap: 15 (ref 5–15)
BUN: 21 mg/dL (ref 6–23)
CO2: 27 mEq/L (ref 19–32)
Calcium: 9.8 mg/dL (ref 8.4–10.5)
Chloride: 95 mEq/L — ABNORMAL LOW (ref 96–112)
Creatinine, Ser: 1.03 mg/dL (ref 0.50–1.10)
GFR calc Af Amer: 57 mL/min — ABNORMAL LOW (ref >90)
GFR calc non Af Amer: 49 mL/min — ABNORMAL LOW (ref >90)
Glucose, Bld: 309 mg/dL — ABNORMAL HIGH (ref 70–99)
Potassium: 4.2 mEq/L (ref 3.7–5.3)
Sodium: 137 mEq/L (ref 137–147)

## 2014-10-13 LAB — CBG MONITORING, ED: Glucose-Capillary: 278 mg/dL — ABNORMAL HIGH (ref 70–99)

## 2014-10-13 MED ORDER — KETOROLAC TROMETHAMINE 30 MG/ML IJ SOLN
30.0000 mg | Freq: Once | INTRAMUSCULAR | Status: AC
  Administered 2014-10-13: 30 mg via INTRAVENOUS
  Filled 2014-10-13: qty 1

## 2014-10-13 MED ORDER — ONDANSETRON HCL 4 MG PO TABS: 4.0000 mg | ORAL_TABLET | Freq: Three times a day (TID) | ORAL | Status: DC | PRN

## 2014-10-13 MED ORDER — ONDANSETRON HCL 4 MG/2ML IJ SOLN
4.0000 mg | Freq: Once | INTRAMUSCULAR | Status: AC
  Administered 2014-10-13: 4 mg via INTRAMUSCULAR
  Filled 2014-10-13: qty 2

## 2014-10-13 MED ORDER — TRAMADOL HCL 50 MG PO TABS
50.0000 mg | ORAL_TABLET | Freq: Four times a day (QID) | ORAL | Status: DC | PRN
Start: 2014-10-13 — End: 2018-09-03

## 2014-10-13 MED ORDER — FENTANYL CITRATE 0.05 MG/ML IJ SOLN
50.0000 ug | Freq: Once | INTRAMUSCULAR | Status: AC
  Administered 2014-10-13: 50 ug via INTRAVENOUS
  Filled 2014-10-13: qty 2

## 2014-10-13 MED ORDER — PROCHLORPERAZINE EDISYLATE 5 MG/ML IJ SOLN
5.0000 mg | Freq: Once | INTRAMUSCULAR | Status: AC
  Administered 2014-10-13: 5 mg via INTRAVENOUS
  Filled 2014-10-13: qty 2

## 2014-10-13 NOTE — ED Provider Notes (Signed)
The patient is an 80 year old female, history of migraine headaches in the past, states that she had acute onset of a right sided headache above her right eye on the forehead which started at 10:00 this morning.  This has been persistent, unremitting, associated with multiple episodes of nausea and vomiting but not associated with any change in vision, difficulty speaking, no numbness, no weakness, no difficulty speaking walking talking or no trouble with balance. On exam the patient has a normal neurologic exam with normal strength sensation and coordination tested by finger-nose-finger, balance and speech. She has normal cranial nerves III through XII, clear heart and lung sounds, no JVD, no peripheral edema. She will need CT scan of her head, check renal function, pain control.  CT scan of the head was unrevealing, headache improved significantly from 10 out of 10-4/10 at complete resolution of nausea. Patient remains with a normal neurologic exam and is requesting discharge. She has been given the indications for return and has expressed her understanding. Prescriptions given as below. Patient and spouse expressed her understanding.  Meds given in ED:  Medications  ondansetron [facility]) injection 4 mg (4 mg Intramuscular Given 10/13/14 1924)  fentaNYL (SUBLIMAZE) injection 50 mcg (50 mcg Intravenous Given 10/13/14 1923)  ketorolac (TORADOL) 30 MG/ML injection 30 mg (30 mg Intravenous Given 10/13/14 2103)  prochlorperazine (COMPAZINE) injection 5 mg (5 mg Intravenous Given 10/13/14 2103)    New Prescriptions   ONDANSETRON (ZOFRAN) 4 MG TABLET    Take 1 tablet (4 mg total) by mouth every 8 (eight) hours as needed for nausea or vomiting.   TRAMADOL (ULTRAM) 50 MG TABLET    Take 1 tablet (50 mg total) by mouth every 6 (six) hours as needed.      Medical screening examination/treatment/procedure(s) were conducted as a shared visit with non-physician practitioner(s) and myself.  I personally  evaluated the patient during the encounter.  Clinical Impression:   Final diagnoses:  Acute nonintractable headache, unspecified headache type  Non-intractable vomiting with nausea, vomiting of unspecified type  Hyperglycemia         Johnna Acosta, MD 10/15/14 [phone]

## 2014-10-13 NOTE — ED Provider Notes (Signed)
CSN: [id number]     Arrival date & time 10/13/14  1805 History   First MD Initiated Contact with Patient 10/13/14 1824     Chief Complaint  Patient presents with  . Headache   [name] is a 80 y.o. female who with history of migraines, HTN, and diabetes presents to the ED with her husband complaining of a right-sided throbbing headache above her right eye. She reports this started around 10 AM today and onset was gradually over 30 minutes. She reports multiple episodes of vomiting today and has vomited on four times and is currently nauseated. She reports that this headache feels differently from her normal migraines. Denies photophobia or phonophobia. She denies numbness, weakness, tingling, changes to her speech or coordination. She denies temporal pain. She denies fevers or chills. She reports taking Ultram around 12 PM today without relief.  (Consider location/radiation/quality/duration/timing/severity/associated sxs/prior Treatment) Patient is a 80 y.o. female presenting with headaches. The history is provided by the patient and the spouse.  Headache Pain location:  Frontal Quality:  Sharp Radiates to:  Does not radiate Severity currently:  9/10 Severity at highest:  10/10 Onset quality:  Gradual Duration:  8 hours Timing:  Constant Progression:  Worsening Chronicity:  New Similar to prior headaches: no   Relieved by:  Nothing Worsened by:  Activity Ineffective treatments:  Prescription medications Associated symptoms: nausea and vomiting   Associated symptoms: no abdominal pain, no back pain, no blurred vision, no cough, no diarrhea, no dizziness, no ear pain, no pain, no fever, no focal weakness, no hearing loss, no loss of balance, no myalgias, no near-syncope, no neck pain, no neck stiffness, no numbness, no paresthesias, no photophobia, no seizures, no sinus pressure, no sore throat, no swollen glands, no syncope, no tingling, no URI and no visual change     Past  Medical History  Diagnosis Date  . Hypertension   . Diabetes mellitus without complication    Past Surgical History  Procedure Laterality Date  . Abdominal hysterectomy    . Appendectomy    . Breast surgery      begnin tumor removed   No family history on file. History  Substance Use Topics  . Smoking status: Never Smoker   . Smokeless tobacco: Not on file  . Alcohol Use: No   OB History   Grav Para Term Preterm Abortions TAB SAB Ect Mult Living                 Review of Systems  Constitutional: Negative for fever.  HENT: Negative for ear pain, hearing loss, sinus pressure and sore throat.   Eyes: Negative for blurred vision, photophobia and pain.  Respiratory: Negative for cough.   Cardiovascular: Negative for syncope and near-syncope.  Gastrointestinal: Positive for nausea and vomiting. Negative for abdominal pain and diarrhea.  Musculoskeletal: Negative for back pain, myalgias, neck pain and neck stiffness.  Neurological: Positive for headaches. Negative for dizziness, focal weakness, seizures, numbness, paresthesias and loss of balance.  All other systems reviewed and are negative.     Allergies  Codeine  Home Medications   Prior to Admission medications   Medication Sig Start Date End Date Taking? Authorizing Provider  gabapentin (NEURONTIN) 600 MG tablet Take 600 mg by mouth 3 (three) times daily.   Yes Historical Provider, MD  glipiZIDE (GLUCOTROL) 5 MG tablet Take 5 mg by mouth daily.   Yes Historical Provider, MD  lisinopril-hydrochlorothiazide (PRINZIDE,ZESTORETIC) 20-12.5 MG per tablet Take 1 tablet by  mouth 2 (two) times daily.   Yes Historical Provider, MD  metFORMIN (GLUCOPHAGE) 1000 MG tablet Take 1,000 mg by mouth 2 (two) times daily. 09/21/14  Yes Historical Provider, MD  metoprolol succinate (TOPROL-XL) 50 MG 24 hr tablet Take 50 mg by mouth daily. Take with or immediately following a meal.   Yes Historical Provider, MD  MYRBETRIQ 50 MG TB24 tablet  Take 50 mg by mouth daily. 09/02/14  Yes Historical Provider, MD  simvastatin (ZOCOR) 20 MG tablet Take 20 mg by mouth every evening.   Yes Historical Provider, MD  sitaGLIPtan-metformin (JANUMET) 50-500 MG per tablet Take 1 tablet by mouth 2 (two) times daily with a meal.   Yes Historical Provider, MD  traMADol (ULTRAM) 50 MG tablet Take 50 mg by mouth 4 (four) times daily as needed for pain.   Yes Historical Provider, MD  zolpidem (AMBIEN) 10 MG tablet Take 10 mg by mouth at bedtime as needed for sleep.   Yes Historical Provider, MD  ALPRAZolam Duanne Moron) 1 MG tablet Take 1 mg by mouth 2 (two) times daily as needed for anxiety.     Historical Provider, MD  furosemide (LASIX) 20 MG tablet Take 20 mg by mouth daily as needed (for fluid retention).     Historical Provider, MD  nystatin-triamcinolone (MYCOLOG II) cream Apply 1 application topically 2 (two) times daily.    Historical Provider, MD  ondansetron (ZOFRAN) 4 MG tablet Take 1 tablet (4 mg total) by mouth every 8 (eight) hours as needed for nausea or vomiting. 10/13/14   Johnna Acosta, MD  traMADol (ULTRAM) 50 MG tablet Take 1 tablet (50 mg total) by mouth every 6 (six) hours as needed. 10/13/14   Johnna Acosta, MD   BP 145/63  Pulse 65  Temp(Src) 98.1 F (36.7 C) (Oral)  Resp 20  Ht 5\' 9"  (1.753 m)  Wt 210 lb (95.255 kg)  BMI 31.00 kg/m2  SpO2 100% Physical Exam  Constitutional: She is oriented to person, place, and time. She appears well-developed and well-nourished. No distress.  HENT:  Head: Normocephalic and atraumatic.  Mouth/Throat: Oropharynx is clear and moist. No oropharyngeal exudate.  Eyes: Pupils are equal, round, and reactive to light. Right eye exhibits no discharge. Left eye exhibits no discharge. No scleral icterus.  Neck: Normal range of motion. Neck supple.  Cardiovascular: Normal rate, regular rhythm, normal heart sounds and intact distal pulses.  Exam reveals no gallop and no friction rub.   No murmur  heard. Pulmonary/Chest: Effort normal and breath sounds normal. No respiratory distress. She has no wheezes. She has no rales. She exhibits no tenderness.  Abdominal: Soft. She exhibits no distension. There is no tenderness.  Musculoskeletal: Normal range of motion.  Lymphadenopathy:    She has no cervical adenopathy.  Neurological: She is alert and oriented to person, place, and time. She displays normal reflexes. No cranial nerve deficit. She exhibits normal muscle tone. Coordination normal.  Skin: Skin is warm and dry. No rash noted. She is not diaphoretic. No erythema. No pallor.  Psychiatric: She has a normal mood and affect. Her behavior is normal.    ED Course  Procedures (including critical care time) Labs Review Labs Reviewed  BASIC METABOLIC PANEL - Abnormal; Notable for the following:    Chloride 95 (*)    Glucose, Bld 309 (*)    GFR calc non Af Amer 49 (*)    GFR calc Af Amer 57 (*)    All other components within  normal limits  CBG MONITORING, ED - Abnormal; Notable for the following:    Glucose-Capillary 278 (*)    All other components within normal limits    Imaging Review Ct Head Wo Contrast  10/13/2014   CLINICAL DATA:  Headache with nausea and vomiting today. History of hypertension, diabetes and breast surgery. Initial encounter.  EXAM: CT HEAD WITHOUT CONTRAST  TECHNIQUE: Contiguous axial images were obtained from the base of the skull through the vertex without intravenous contrast.  COMPARISON:  Prior studies 03/13/2013  FINDINGS: There is no evidence of acute intracranial hemorrhage, mass lesion, brain edema or extra-axial fluid collection. The ventricles and subarachnoid spaces are diffusely prominent but stable. There is no CT evidence of acute cortical infarction. Prominent intracranial vascular calcifications noted.  The visualized paranasal sinuses are clear. There is a small mastoid effusion on the left. The middle ears and right mastoid air cells are clear.  The calvarium is intact.  IMPRESSION: 1. No acute intracranial findings.  Stable atrophy. 2. Small left mastoid effusion without coalescence.   Electronically Signed   By: Camie Patience M.D.   On: 10/13/2014 19:51     EKG Interpretation None      MDM   Final diagnoses:  Acute nonintractable headache, unspecified headache type  Non-intractable vomiting with nausea, vomiting of unspecified type  Hyperglycemia    The patient's CT head showed no acute findings. She denies numbness, tingling, weakness. She has a normal neurological exam and normal strength and coordination. Patient's headache resolved while in the ER with medications given. Patient requesting to be discharged at this time. Patient advised return to the ED with new or worsening symptoms or new concerns. Patient seen in conjunction with Dr. Sabra Heck.    Hanley Hays, [place] 10/14/14 1714

## 2014-10-13 NOTE — Discharge Instructions (Signed)
Please call your doctor for a followup appointment within 24-48 hours. When you talk to your doctor please let them know that you were seen in the emergency department and have them acquire all of your records so that they can discuss the findings with you and formulate a treatment plan to fully care for your new and ongoing problems.  Headache:  You are having a headache. No specific cause was found today for your headache. It may have been a migraine or other cause of headache. Stress, anxiety, fatigue, and depression are common triggers for headaches. Your headache today does not appear to be life-threatening or require hospitalization, but often the exact cause of headaches is not determined in the emergency department. Therefore, followup with your doctor is very important to find out what may have caused your headache, and whether or not you need any further diagnostic testing or treatment. Sometimes headaches can appear benign but then more serious symptoms can develop which should prompt an immediate reevaluation by your doctor or the emergency department.  Seek immediate medical attention if:   You develop possible problems with medications prescribed.  The medications don't resolve your headache, if it recurs, or if you have multiple episodes of vomiting or can't take fluids by mouth  You have a change from the usual headache.  If you developed a sudden severe headache or confusion, become poorly responsive or faint, developed a fever above 100.4 or problems breathing, have a change in speech, vision, swallowing or understanding, or developed new weakness, numbness, tingling, incoordination or have a seizure.  If you don't have a family doctor to follow up with, see the follow up list below - call this morning for a follow-up appointment in the next 1-2 days.

## 2014-10-13 NOTE — ED Notes (Signed)
Headache with n/v starting today.  Denies light/sound sensitivity.

## 2014-10-15 NOTE — ED Provider Notes (Signed)
Medical screening examination/treatment/procedure(s) were conducted as a shared visit with non-physician practitioner(s) and myself.  I personally evaluated the patient during the encounter  Please see my separate respective documentation pertaining to this patient encounter   Johnna Acosta, MD 10/15/14 [phone]

## 2014-10-22 DIAGNOSIS — R51 Headache: Secondary | ICD-10-CM | POA: Diagnosis not present

## 2014-10-22 DIAGNOSIS — E6609 Other obesity due to excess calories: Secondary | ICD-10-CM | POA: Diagnosis not present

## 2014-10-22 DIAGNOSIS — Z6832 Body mass index (BMI) 32.0-32.9, adult: Secondary | ICD-10-CM | POA: Diagnosis not present

## 2014-10-22 DIAGNOSIS — R1110 Vomiting, unspecified: Secondary | ICD-10-CM | POA: Diagnosis not present

## 2014-10-26 DIAGNOSIS — L851 Acquired keratosis [keratoderma] palmaris et plantaris: Secondary | ICD-10-CM | POA: Diagnosis not present

## 2014-10-26 DIAGNOSIS — E1142 Type 2 diabetes mellitus with diabetic polyneuropathy: Secondary | ICD-10-CM | POA: Diagnosis not present

## 2014-10-26 DIAGNOSIS — B351 Tinea unguium: Secondary | ICD-10-CM | POA: Diagnosis not present

## 2015-01-18 DIAGNOSIS — B351 Tinea unguium: Secondary | ICD-10-CM | POA: Diagnosis not present

## 2015-01-18 DIAGNOSIS — L851 Acquired keratosis [keratoderma] palmaris et plantaris: Secondary | ICD-10-CM | POA: Diagnosis not present

## 2015-01-18 DIAGNOSIS — E1142 Type 2 diabetes mellitus with diabetic polyneuropathy: Secondary | ICD-10-CM | POA: Diagnosis not present

## 2015-02-24 DIAGNOSIS — E1165 Type 2 diabetes mellitus with hyperglycemia: Secondary | ICD-10-CM | POA: Diagnosis not present

## 2015-02-24 DIAGNOSIS — E782 Mixed hyperlipidemia: Secondary | ICD-10-CM | POA: Diagnosis not present

## 2015-02-24 DIAGNOSIS — E6609 Other obesity due to excess calories: Secondary | ICD-10-CM | POA: Diagnosis not present

## 2015-02-24 DIAGNOSIS — I10 Essential (primary) hypertension: Secondary | ICD-10-CM | POA: Diagnosis not present

## 2015-02-24 DIAGNOSIS — Z6832 Body mass index (BMI) 32.0-32.9, adult: Secondary | ICD-10-CM | POA: Diagnosis not present

## 2015-03-10 ENCOUNTER — Other Ambulatory Visit (HOSPITAL_COMMUNITY): Payer: Self-pay | Admitting: Family Medicine

## 2015-03-10 ENCOUNTER — Ambulatory Visit (HOSPITAL_COMMUNITY)
Admission: RE | Admit: 2015-03-10 | Discharge: 2015-03-10 | Disposition: A | Discharge status: Home / Self Care | Payer: Medicare Other | Source: Ambulatory Visit | Attending: Family Medicine | Admitting: Family Medicine

## 2015-03-10 DIAGNOSIS — M19029 Primary osteoarthritis, unspecified elbow: Secondary | ICD-10-CM

## 2015-03-10 DIAGNOSIS — M5030 Other cervical disc degeneration, unspecified cervical region: Secondary | ICD-10-CM | POA: Diagnosis not present

## 2015-03-10 DIAGNOSIS — M479 Spondylosis, unspecified: Secondary | ICD-10-CM | POA: Diagnosis not present

## 2015-03-10 DIAGNOSIS — M542 Cervicalgia: Secondary | ICD-10-CM

## 2015-03-10 DIAGNOSIS — M1991 Primary osteoarthritis, unspecified site: Secondary | ICD-10-CM | POA: Diagnosis not present

## 2015-03-10 DIAGNOSIS — M5031 Other cervical disc degeneration,  high cervical region: Secondary | ICD-10-CM | POA: Diagnosis not present

## 2015-03-10 DIAGNOSIS — M47812 Spondylosis without myelopathy or radiculopathy, cervical region: Secondary | ICD-10-CM | POA: Diagnosis not present

## 2015-03-10 DIAGNOSIS — Z6832 Body mass index (BMI) 32.0-32.9, adult: Secondary | ICD-10-CM | POA: Diagnosis not present

## 2015-03-18 ENCOUNTER — Other Ambulatory Visit (HOSPITAL_COMMUNITY): Payer: PRIVATE HEALTH INSURANCE

## 2015-03-18 ENCOUNTER — Ambulatory Visit (HOSPITAL_COMMUNITY)
Admission: RE | Admit: 2015-03-18 | Discharge: 2015-03-18 | Disposition: A | Discharge status: Home / Self Care | Payer: Medicare Other | Source: Ambulatory Visit | Attending: Family Medicine | Admitting: Family Medicine

## 2015-03-18 DIAGNOSIS — M1991 Primary osteoarthritis, unspecified site: Secondary | ICD-10-CM | POA: Diagnosis not present

## 2015-03-18 DIAGNOSIS — M542 Cervicalgia: Secondary | ICD-10-CM | POA: Diagnosis not present

## 2015-03-18 DIAGNOSIS — M4802 Spinal stenosis, cervical region: Secondary | ICD-10-CM | POA: Diagnosis not present

## 2015-03-18 DIAGNOSIS — M19029 Primary osteoarthritis, unspecified elbow: Secondary | ICD-10-CM

## 2015-03-18 DIAGNOSIS — M5030 Other cervical disc degeneration, unspecified cervical region: Secondary | ICD-10-CM | POA: Diagnosis not present

## 2015-03-23 DIAGNOSIS — E1165 Type 2 diabetes mellitus with hyperglycemia: Secondary | ICD-10-CM | POA: Diagnosis not present

## 2015-03-23 DIAGNOSIS — I10 Essential (primary) hypertension: Secondary | ICD-10-CM | POA: Diagnosis not present

## 2015-03-29 DIAGNOSIS — L851 Acquired keratosis [keratoderma] palmaris et plantaris: Secondary | ICD-10-CM | POA: Diagnosis not present

## 2015-03-29 DIAGNOSIS — B351 Tinea unguium: Secondary | ICD-10-CM | POA: Diagnosis not present

## 2015-03-29 DIAGNOSIS — E1142 Type 2 diabetes mellitus with diabetic polyneuropathy: Secondary | ICD-10-CM | POA: Diagnosis not present

## 2015-04-04 DIAGNOSIS — M542 Cervicalgia: Secondary | ICD-10-CM | POA: Diagnosis not present

## 2015-04-04 DIAGNOSIS — Z6830 Body mass index (BMI) 30.0-30.9, adult: Secondary | ICD-10-CM | POA: Diagnosis not present

## 2015-04-04 DIAGNOSIS — M47812 Spondylosis without myelopathy or radiculopathy, cervical region: Secondary | ICD-10-CM | POA: Diagnosis not present

## 2015-04-04 DIAGNOSIS — M4313 Spondylolisthesis, cervicothoracic region: Secondary | ICD-10-CM | POA: Diagnosis not present

## 2015-04-04 DIAGNOSIS — M5022 Other cervical disc displacement, mid-cervical region: Secondary | ICD-10-CM | POA: Diagnosis not present

## 2015-04-04 DIAGNOSIS — M5412 Radiculopathy, cervical region: Secondary | ICD-10-CM | POA: Diagnosis not present

## 2015-04-07 DIAGNOSIS — I10 Essential (primary) hypertension: Secondary | ICD-10-CM | POA: Diagnosis not present

## 2015-04-07 DIAGNOSIS — E1165 Type 2 diabetes mellitus with hyperglycemia: Secondary | ICD-10-CM | POA: Diagnosis not present

## 2015-04-07 DIAGNOSIS — E669 Obesity, unspecified: Secondary | ICD-10-CM | POA: Diagnosis not present

## 2015-04-18 ENCOUNTER — Ambulatory Visit (HOSPITAL_COMMUNITY): Payer: Medicare Other | Attending: Neurosurgery | Admitting: Specialist

## 2015-04-18 DIAGNOSIS — M542 Cervicalgia: Secondary | ICD-10-CM | POA: Insufficient documentation

## 2015-04-18 DIAGNOSIS — M6289 Other specified disorders of muscle: Secondary | ICD-10-CM | POA: Diagnosis not present

## 2015-04-18 DIAGNOSIS — I10 Essential (primary) hypertension: Secondary | ICD-10-CM | POA: Insufficient documentation

## 2015-04-18 DIAGNOSIS — E119 Type 2 diabetes mellitus without complications: Secondary | ICD-10-CM | POA: Insufficient documentation

## 2015-04-18 DIAGNOSIS — M629 Disorder of muscle, unspecified: Secondary | ICD-10-CM

## 2015-04-18 NOTE — Therapy (Signed)
[place], [place], [address] Phone: [phone]   Fax:  [phone]  Occupational Therapy Evaluation  Patient Details  Name: [name] MRN: [id number] Date of Birth: Jan 22, 1929 Referring Provider:  Erline Levine, MD  Encounter Date: 04/18/2015      OT End of Session - 04/18/15 2211    Visit Number 1   Number of Visits 16   Date for OT Re-Evaluation 06/17/15  mini reassess 05/15/15   Authorization Type medicare    Authorization Time Period before 10th visit   Authorization - Visit Number 1   Authorization - Number of Visits 10   OT Start Time 1145   OT Stop Time 1230   OT Time Calculation (min) 45 min   Activity Tolerance Patient tolerated treatment well   Behavior During Therapy [facility] for tasks assessed/performed      Past Medical History  Diagnosis Date  . Hypertension   . Diabetes mellitus without complication     Past Surgical History  Procedure Laterality Date  . Abdominal hysterectomy    . Appendectomy    . Breast surgery      begnin tumor removed    There were no vitals filed for this visit.  Visit Diagnosis:  Cervicalgia - Plan: Ot plan of care cert/re-cert  Tight fascia - Plan: Ot plan of care cert/re-cert      Subjective Assessment - 04/18/15 2158    Subjective  I think my problem started during tax season.   Pertinent History [name] states that she began having neck pain and headaches approximately 2 months ago.  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  She has been referred to occupational therapy for evaluioation nd treatment.   MRI detectes anterolisthesis of        C& on T! with sondylosisi at C3, C$, C4 C%, C5 C6, C6 C7   Special Tests FOTO 58/100   Patient Stated Goals I want to get back like I was   Currently in Pain? Yes   Pain Score 6    Pain Location Neck   Pain Orientation Right;Left   Pain Descriptors /  Indicators Aching;Headache   Pain Type Acute pain   Pain Onset More than a month ago   Pain Frequency Intermittent   Aggravating Factors  computer work   Pain Relieving Factors rest   Effect of Pain on Daily Activities unable to turn right and left           [facility] OT Assessment - 04/18/15 0001    Assessment   Diagnosis Cervicalgia   Onset Date 02/22/15   Precautions   Precautions None   Restrictions   Weight Bearing Restrictions No   Balance Screen   Has the patient fallen in the past 6 months No   Has the patient had a decrease in activity level because of a fear of falling?  No   Is the patient reluctant to leave their home because of a fear of falling?  No   Home  Environment   Family/patient expects to be discharged to: Private residence   Lives With Spouse   Prior Function   Level of Independence Independent with basic ADLs;Independent with homemaking with ambulation   Vocation Full time employment   Psychologist, forensic - completes book work, Teaching laboratory technician, telephone   Leisure spending time with family   ADL   ADL comments  difficulty sleeping, getting comfortable, hurts to look down at computer, painful to turn her head right and left when driving   Written Expression   Dominant Hand Right   Cognition   Overall Cognitive Status Within Functional Limits for tasks assessed   Observation/Other Assessments   Focus on Therapeutic Outcomes (FOTO)  58/100 42% impaired    Sensation   Light Touch Appears Intact   Coordination   Gross Motor Movements are Fluid and Coordinated Yes   Fine Motor Movements are Fluid and Coordinated Yes   ROM / Strength   AROM / PROM / Strength AROM;Strength   Palpation   Palpation max fascial restrictions in cervical, SCM, scapular, trapezius region   AROM   Overall AROM Comments Bilateral shoulder AROM is WFL for ADLs   AROM Assessment Site Shoulder;Cervical   Right/Left Shoulder Right;Left   Cervical Flexion 18    Cervical Extension 12   Cervical - Right Side Bend 10   Cervical - Left Side Bend 12   Cervical - Right Rotation 41   Cervical - Left Rotation 30   Strength   Overall Strength Comments bilateral shoulder strength is 4/5                  OT Treatments/Exercises (OP) - 04/18/15 0001    Manual Therapy   Manual Therapy Myofascial release   Myofascial Release Myofascial release and manual stretching to bilateral scapular, trapezius, SCM, cervical region with manual cervical traction to decrease pain and restrictions and improve pain free mobility.                OT Education - 04/18/15 2210    Education provided Yes   Education Details neck AROM, shoulder extension and row   Person(s) Educated Patient   Methods Explanation;Demonstration;Handout   Comprehension Verbalized understanding          OT Short Term Goals - 04/18/15 2216    OT SHORT TERM GOAL #1   Title Patient will be educated on a HEP.   Time 4   Period Weeks   Status New   OT SHORT TERM GOAL #2   Title Patient will increase strength in her shoulders to 4+/5 for increased abilty to lift pots and pans.    Time 4   Period Weeks   Status New   OT SHORT TERM GOAL #3   Title Patient will increase postural alignment by 25% for increased shoulder alignment and decreased pain when working.   Time 4   Period Weeks   Status New   OT SHORT TERM GOAL #4   Title Patient will decrease pain to 4/10 in her neck region while working on the computer.   Time 4   Period Weeks   Status New   OT SHORT TERM GOAL #5   Title Patient will decrease fascial restrictions to moderate in her cervical region.   Time 4   Period Weeks   Status New           OT Long Term Goals - 04/18/15 2218    OT LONG TERM GOAL #1   Title Patient will be able to complete daily activities and sleep through the night.   Time 8   Period Weeks   Status New   OT LONG TERM GOAL #2   Title Patient will improve cervical AROM to [facility] for  increased ability to look to right and left when driving.   Time 8   Period Weeks   Status  New   OT LONG TERM GOAL #3   Title Patient will decrease pain to 2/10 in her cervical region when completing work activiites.    Time 8   Period Weeks   Status New   OT LONG TERM GOAL #4   Title Patient will decrease fascial restrictions to minimal in her cervical region.   Time 8   Period Weeks   Status New   OT LONG TERM GOAL #5   Title Patient will improve postural stabiity and scapular alignment by 70%.   Time 8   Period Weeks   Status New               Plan - 03-May-2015 April 08, 2211    Clinical Impression Statement A:  Patient is an 81 year old female referred to occupational therapy for evaluation and treatment for cervicalgia.  MRI detected anterolisthesis of C7 T1 and spondylosis from C3-C7.  Pain and discomfort have caused decreases mobility in her nect and difficulty completing ADLs, turning to look right and left when driving, and difficulty sleeping at night.     Pt will benefit from skilled therapeutic intervention in order to improve on the following deficits (Retired) Decreased range of motion;Decreased strength;Increased fascial restricitons;Increased muscle spasms;Pain   Rehab Potential Good   OT Frequency 2x / week   OT Duration 8 weeks   OT Treatment/Interventions Self-care/ADL training;Electrical Stimulation;Moist Heat;Traction;Therapeutic exercise;Therapeutic activities;Passive range of motion;Manual Therapy;Patient/family education   Plan P: Patient will benefit from skilled OT intervention to decrease pain and restrictions and improve pain free mobility in her neck and strength in her shoulders in order to return to prior level of independence with all B/IADLs.    Next visit:  MFR and manual stretching scapular stability and psotural exercises.   Consulted and Agree with Plan of Care Patient          G-Codes - 05-03-15 04/07/2221    Functional Assessment Tool Used FOTO 42%  impairment   Functional Limitation Self care   Self Care Current Status [phone]) At least 40 percent but less than 60 percent impaired, limited or restricted   Self Care Goal Status ([id number]) At least 20 percent but less than 40 percent impaired, limited or restricted      Problem List Patient Active Problem List   Diagnosis Date Noted  . HIP PAIN 01/12/2009  . SPINAL STENOSIS 01/12/2009  . LOW BACK PAIN 01/12/2009  . SPONDYLOLYSIS 01/12/2009  . SHOULDER PAIN 10/04/2008  . SPONDYLOSIS 10/04/2008  . DIABETES 10/01/2008  . HIGH BLOOD PRESSURE 10/01/2008    Vangie Bicker, OTR/L [phone]  2015/05/03, 10:26 PM  [facility] [address] [place], [place], [address] Phone: [phone]   Fax:  [phone]

## 2015-04-18 NOTE — Patient Instructions (Signed)
  AROM: Lateral Neck Flexion   Slowly tilt head toward one shoulder, then the other. Hold each position ____ seconds. Repeat ____ times per set. Do ____ sets per session. Do ____ sessions per day.  http://orth.exer.us/296   Copyright  VHI. All rights reserved.  AROM: Neck Extension   Bend head backward. Hold ____ seconds. Repeat ____ times per set. Do ____ sets per session. Do ____ sessions per day.  http://orth.exer.us/300   Copyright  VHI. All rights reserved.  AROM: Neck Flexion   Bend head forward. Hold ____ seconds. Repeat ____ times per set. Do ____ sets per session. Do ____ sessions per day.  http://orth.exer.us/298   Copyright  VHI. All rights reserved.  AROM: Neck Rotation   Turn head slowly to look over one shoulder, then the other. Hold each position ____ seconds. Repeat ____ times per set. Do ____ sets per session. Do ____ sessions per day.  http://orth.exer.us/294   Copyright  VHI. All rights reserved.      Extension (Active) ROM: Extension (Standing)   Bring arms straight back as far as possible without pain. Repeat ____ times per set. Do ____ sets per session. Do ____ sessions per day.  http://orth.exer.us/916   Copyright  VHI. All rights reserved.       Stand, arms straight at sides. Bring arms straight forward and upward as high as possible without pain. Hold ___ seconds. Repeat ___ times per session. Do ___ sessions per day.  Copyright  VHI. All rights reserved.   Scapular Retraction (Standing)   With arms at sides, pinch shoulder blades together. Repeat ____ times per set. Do ____ sets per session. Do ____ sessions per day.  http://orth.exer.us/944   Copyright  VHI. All rights reserved.

## 2015-04-20 ENCOUNTER — Ambulatory Visit (HOSPITAL_COMMUNITY): Payer: Medicare Other

## 2015-04-20 ENCOUNTER — Encounter (HOSPITAL_COMMUNITY): Payer: Self-pay

## 2015-04-20 DIAGNOSIS — M6289 Other specified disorders of muscle: Secondary | ICD-10-CM

## 2015-04-20 DIAGNOSIS — E119 Type 2 diabetes mellitus without complications: Secondary | ICD-10-CM | POA: Diagnosis not present

## 2015-04-20 DIAGNOSIS — M542 Cervicalgia: Secondary | ICD-10-CM | POA: Diagnosis not present

## 2015-04-20 DIAGNOSIS — I10 Essential (primary) hypertension: Secondary | ICD-10-CM | POA: Diagnosis not present

## 2015-04-20 DIAGNOSIS — M629 Disorder of muscle, unspecified: Secondary | ICD-10-CM

## 2015-04-20 NOTE — Therapy (Signed)
[place], [place], [address] Phone: [phone]   Fax:  [phone]  Occupational Therapy Treatment  Patient Details  Name: [name] MRN: [id number] Date of Birth: Dec 19, 1929 Referring Provider:  Sharilyn Sites, MD  Encounter Date: 04/20/2015      OT End of Session - 04/20/15 1447    Visit Number 2   Number of Visits 16   Date for OT Re-Evaluation 06/17/15  mini reassess 05/15/15   Authorization Type medicare    Authorization Time Period before 10th visit   Authorization - Visit Number 2   Authorization - Number of Visits 10   OT Start Time 1345   OT Stop Time 1430   OT Time Calculation (min) 45 min   Activity Tolerance Patient tolerated treatment well   Behavior During Therapy [facility] for tasks assessed/performed      Past Medical History  Diagnosis Date  . Hypertension   . Diabetes mellitus without complication     Past Surgical History  Procedure Laterality Date  . Abdominal hysterectomy    . Appendectomy    . Breast surgery      begnin tumor removed    There were no vitals filed for this visit.  Visit Diagnosis:  Tight fascia      Subjective Assessment - 04/20/15 1358    Subjective  S: "My exercises are going okay."   Currently in Pain? Yes   Pain Score 5    Pain Location Neck   Pain Orientation Right;Left   Pain Descriptors / Indicators Aching;Tightness   Pain Type Acute pain            [facility] OT Assessment - 04/20/15 1359    Assessment   Diagnosis Cervicalgia   Precautions   Precautions None                  OT Treatments/Exercises (OP) - 04/20/15 1400    Exercises   Exercises Neck;Shoulder   Neck Exercises: Stretches   Upper Trapezius Stretch 3 reps;10 seconds   Neck Stretch 3 reps;10 seconds   Lower Cervical/Upper Thoracic Stretch 3 reps;10 seconds   Neck Exercises: Supine   Cervical Rotation Both;5 reps  5" hold; PROM   Lateral Flexion Both;5  reps  5" hold; PROM   Other Supine Exercise Supine Head Nod 10X   Other Supine Exercise Supine Head Lift 10X   Shoulder Exercises: Supine   Other Supine Exercises Serratus Anterior punch; AROM; 10X  Attempted but unable to complete technique.   Shoulder Exercises: ROM/Strengthening   Proximal Shoulder Strengthening, Supine 10X   Manual Therapy   Manual Therapy Myofascial release   Myofascial Release Myofascial release and manual stretching to bilateral scapular, trapezius, SCM, cervical region with manual cervical traction to decrease pain and restrictions and improve pain free mobility.                   OT Short Term Goals - 04/20/15 1411    OT SHORT TERM GOAL #1   Title Patient will be educated on a HEP.   Time 4   Period Weeks   Status On-going   OT SHORT TERM GOAL #2   Title Patient will increase strength in her shoulders to 4+/5 for increased abilty to lift pots and pans.    Time 4   Period Weeks   Status On-going   OT SHORT TERM GOAL #3   Title Patient will increase postural alignment by 25%  for increased shoulder alignment and decreased pain when working.   Time 4   Period Weeks   Status On-going   OT SHORT TERM GOAL #4   Title Patient will decrease pain to 4/10 in her neck region while working on the computer.   Time 4   Period Weeks   Status On-going   OT SHORT TERM GOAL #5   Title Patient will decrease fascial restrictions to moderate in her cervical region.   Time 4   Period Weeks   Status On-going           OT Long Term Goals - 04/20/15 1411    OT LONG TERM GOAL #1   Title Patient will be able to complete daily activities and sleep through the night.   Time 8   Period Weeks   Status On-going   OT LONG TERM GOAL #2   Title Patient will improve cervical AROM to [facility] for increased ability to look to right and left when driving.   Time 8   Period Weeks   Status On-going   OT LONG TERM GOAL #3   Title Patient will decrease pain to 2/10 in  her cervical region when completing work activiites.    Time 8   Period Weeks   Status On-going   OT LONG TERM GOAL #4   Title Patient will decrease fascial restrictions to minimal in her cervical region.   Time 8   Period Weeks   Status On-going   OT LONG TERM GOAL #5   Title Patient will improve postural stabiity and scapular alignment by 70%.   Time 8   Period Weeks   Status On-going               Plan - 04/20/15 1448    Clinical Impression Statement A:  Initiated Myofascial Release Massage and manual stretching to decrease fascial restrictions to increase range of motion in a pain free zone.  Attempted serratus anterior punch AROM in supine; Pt was unable to complete due to inability to achieve proper technique.  Initiated neck stretches (flexibility, lower cervical/upper thoracic stretch, upper trapezius stretch).  Pt required verbal and tactile cues for proper form and technique.  Pt tolerated exercises well.   Plan P:  Resume neck stretches and add to HEP.  Add AROM scapular exercises in seated.          Problem List Patient Active Problem List   Diagnosis Date Noted  . HIP PAIN 01/12/2009  . SPINAL STENOSIS 01/12/2009  . LOW BACK PAIN 01/12/2009  . SPONDYLOLYSIS 01/12/2009  . SHOULDER PAIN 10/04/2008  . SPONDYLOSIS 10/04/2008  . DIABETES 10/01/2008  . HIGH BLOOD PRESSURE 10/01/2008    Elba Barman, OTA Student [phone]  04/20/2015, 4:31 PM  [facility] [address] [place], [place], [address] Phone: [phone]   Fax:  [phone]

## 2015-04-25 ENCOUNTER — Ambulatory Visit (HOSPITAL_COMMUNITY): Payer: Medicare Other | Attending: Neurosurgery

## 2015-04-25 ENCOUNTER — Encounter (HOSPITAL_COMMUNITY): Payer: Self-pay

## 2015-04-25 DIAGNOSIS — M542 Cervicalgia: Secondary | ICD-10-CM | POA: Insufficient documentation

## 2015-04-25 DIAGNOSIS — I10 Essential (primary) hypertension: Secondary | ICD-10-CM | POA: Insufficient documentation

## 2015-04-25 DIAGNOSIS — E119 Type 2 diabetes mellitus without complications: Secondary | ICD-10-CM | POA: Diagnosis not present

## 2015-04-25 DIAGNOSIS — M6289 Other specified disorders of muscle: Secondary | ICD-10-CM | POA: Diagnosis not present

## 2015-04-25 DIAGNOSIS — M629 Disorder of muscle, unspecified: Secondary | ICD-10-CM

## 2015-04-25 NOTE — Therapy (Signed)
[place], [place], [address] Phone: [phone]   Fax:  [phone]  Occupational Therapy Treatment  Patient Details  Name: [name] MRN: [id number] Date of Birth: [birth year] Referring Provider:  Erline Levine, MD  Encounter Date: 04/25/2015      OT End of Session - 04/25/15 1449    Visit Number 3   Number of Visits 16   Date for OT Re-Evaluation 06/17/15  mini reassess 05/15/15   Authorization Type medicare    Authorization Time Period before 10th visit   Authorization - Visit Number 3   Authorization - Number of Visits 10   OT Start Time 1345   OT Stop Time 1430   OT Time Calculation (min) 45 min   Activity Tolerance Patient tolerated treatment well   Behavior During Therapy [facility] for tasks assessed/performed      Past Medical History  Diagnosis Date  . Hypertension   . Diabetes mellitus without complication     Past Surgical History  Procedure Laterality Date  . Abdominal hysterectomy    . Appendectomy    . Breast surgery      begnin tumor removed    There were no vitals filed for this visit.  Visit Diagnosis:  Tight fascia      Subjective Assessment - 04/25/15 1357    Subjective  S:  "My doctor sent a copy of my MRI."   Currently in Pain? Yes   Pain Score 5    Pain Location Neck   Pain Orientation Right;Left            [facility] OT Assessment - 04/25/15 1358    Assessment   Diagnosis Cervicalgia   Precautions   Precautions None                  OT Treatments/Exercises (OP) - 04/25/15 1359    Exercises   Exercises Neck;Shoulder   Neck Exercises: Stretches   Upper Trapezius Stretch 3 reps;10 seconds   Levator Stretch 2 reps;10 seconds   Neck Stretch 3 reps;10 seconds   Lower Cervical/Upper Thoracic Stretch 1 rep;10 seconds   Neck Exercises: Seated   Neck Retraction 1 rep;10 secs   Neck Exercises: Supine   Cervical Rotation Both;5 reps  5" hold   Lateral Flexion Both;5 reps  5" hold   Other Supine Exercise Supine Head Nod 10X   Other Supine Exercise Supine Head Lift 10X   Shoulder Exercises: Seated   Elevation AROM;10 reps   Extension AROM;10 reps   Row AROM;10 reps   Manual Therapy   Manual Therapy Myofascial release   Myofascial Release Myofascial release and manual stretching to bilateral scapular, trapezius, SCM, cervical region with manual cervical traction to decrease pain and restrictions and improve pain free mobility.                 OT Education - 04/25/15 1445    Education provided Yes   Education Details Added cervical spine stretches:  Upper trapezius, Levator Scapula, Upper Thoracic, Neck Retraction, and Neck Stretch; to HEP.   Person(s) Educated Patient   Methods Explanation;Demonstration;Handout   Comprehension Verbalized understanding;Returned demonstration          OT Short Term Goals - 04/20/15 1411    OT SHORT TERM GOAL #1   Title Patient will be educated on a HEP.   Time 4   Period Weeks   Status On-going   OT SHORT TERM GOAL #2  Title Patient will increase strength in her shoulders to 4+/5 for increased abilty to lift pots and pans.    Time 4   Period Weeks   Status On-going   OT SHORT TERM GOAL #3   Title Patient will increase postural alignment by 25% for increased shoulder alignment and decreased pain when working.   Time 4   Period Weeks   Status On-going   OT SHORT TERM GOAL #4   Title Patient will decrease pain to 4/10 in her neck region while working on the computer.   Time 4   Period Weeks   Status On-going   OT SHORT TERM GOAL #5   Title Patient will decrease fascial restrictions to moderate in her cervical region.   Time 4   Period Weeks   Status On-going           OT Long Term Goals - 04/20/15 1411    OT LONG TERM GOAL #1   Title Patient will be able to complete daily activities and sleep through the night.   Time 8   Period Weeks   Status On-going   OT  LONG TERM GOAL #2   Title Patient will improve cervical AROM to [facility] for increased ability to look to right and left when driving.   Time 8   Period Weeks   Status On-going   OT LONG TERM GOAL #3   Title Patient will decrease pain to 2/10 in her cervical region when completing work activiites.    Time 8   Period Weeks   Status On-going   OT LONG TERM GOAL #4   Title Patient will decrease fascial restrictions to minimal in her cervical region.   Time 8   Period Weeks   Status On-going   OT LONG TERM GOAL #5   Title Patient will improve postural stabiity and scapular alignment by 70%.   Time 8   Period Weeks   Status On-going               Plan - 04/25/15 1450    Clinical Impression Statement A:  Added neck stretches to HEP: Upper Trapezius, Neck Stretch, Lower Cervical/Upper Thoracic, Neck Retraction, Levator Scapula.  Added AROM scapular exercises to this session.  Unable to complete all reps for stretches due to time constraint.  Pt tolerated exercises well.   Plan P:  Resume neck stretches and follow-up on HEP additional exercises.        Problem List Patient Active Problem List   Diagnosis Date Noted  . HIP PAIN 01/12/2009  . SPINAL STENOSIS 01/12/2009  . LOW BACK PAIN 01/12/2009  . SPONDYLOLYSIS 01/12/2009  . SHOULDER PAIN 10/04/2008  . SPONDYLOSIS 10/04/2008  . DIABETES 10/01/2008  . HIGH BLOOD PRESSURE 10/01/2008    Ailene Ravel, OTR/L,CBIS  [phone]  04/25/2015, 3:01 PM  [facility] [address] [place], [place], [address] Phone: [phone]   Fax:  [phone]

## 2015-04-25 NOTE — Patient Instructions (Signed)
Flexibility: Neck Stretch   Grasp left arm above wrist and pull down across body while gently tilting head same direction. Hold for 3 seconds. Repeat 10 times.Adduction (Active) (Perform stretch for both sides)  Flexibility: Upper Trapezius Stretch   Gently grasp right side of head while reaching behind back with other hand. Tilt head away until a gentle stretch is felt. Hold _10___ seconds. Repeat _3___ times per set. Do ____ sets per session. Do _2-3___ sessions per day.  (Perform stretch for both sides.)  http://orth.exer.us/341   Copyright  VHI. All rights reserved.      Levator Scapula Stretch   Place left hand on same side shoulder blade. With other hand, gently stretch head down and away. Hold 10 seconds. Repeat 3 times.   Flexibility: Neck Retraction   Pull head straight back, keeping eyes and jaw level. *Give yourself a double chin.* Hold 10 seconds. Repeat 3 times.   Lower Cervical / Upper Thoracic Stretch   Clasp hands together in front with arms extended. Gently pull shoulder blades apart and bend head forward. Hold 10 seconds. Repeat 3 times.

## 2015-04-26 ENCOUNTER — Ambulatory Visit (HOSPITAL_COMMUNITY): Payer: PRIVATE HEALTH INSURANCE | Admitting: Physical Therapy

## 2015-04-27 ENCOUNTER — Ambulatory Visit (HOSPITAL_COMMUNITY): Payer: Medicare Other | Admitting: Specialist

## 2015-04-27 DIAGNOSIS — I10 Essential (primary) hypertension: Secondary | ICD-10-CM | POA: Diagnosis not present

## 2015-04-27 DIAGNOSIS — M542 Cervicalgia: Secondary | ICD-10-CM | POA: Diagnosis not present

## 2015-04-27 DIAGNOSIS — M6289 Other specified disorders of muscle: Secondary | ICD-10-CM | POA: Diagnosis not present

## 2015-04-27 DIAGNOSIS — E119 Type 2 diabetes mellitus without complications: Secondary | ICD-10-CM | POA: Diagnosis not present

## 2015-04-27 DIAGNOSIS — M629 Disorder of muscle, unspecified: Secondary | ICD-10-CM

## 2015-04-27 NOTE — Therapy (Signed)
[place], [place], [address] Phone: [phone]   Fax:  [phone]  Occupational Therapy Treatment  Patient Details  Name: [name] MRN: [id number] Date of Birth: 06/20/1929 Referring Provider:  Erline Levine, MD  Encounter Date: 04/27/2015      OT End of Session - 04/27/15 1538    Visit Number 4   Number of Visits 16   Date for OT Re-Evaluation 06/17/15  mini reassess on 5/22   Authorization Type medicare    Authorization Time Period before 10th visit   Authorization - Visit Number 4   Authorization - Number of Visits 10   OT Start Time 1353   OT Stop Time 1430   OT Time Calculation (min) 37 min   Activity Tolerance Patient tolerated treatment well   Behavior During Therapy [facility] for tasks assessed/performed      Past Medical History  Diagnosis Date  . Hypertension   . Diabetes mellitus without complication     Past Surgical History  Procedure Laterality Date  . Abdominal hysterectomy    . Appendectomy    . Breast surgery      begnin tumor removed    There were no vitals filed for this visit.  Visit Diagnosis:  Tight fascia  Cervicalgia      Subjective Assessment - 04/27/15 1532    Subjective  S:  Right now I dont have any pain.  I think therapy is really helping.   Currently in Pain? No/denies                      OT Treatments/Exercises (OP) - 04/27/15 0001    Neck Exercises: Seated   Neck Retraction 10 secs;10 reps   Other Seated Exercise neck flexion, extension, lateral flexion, and rotation side to side 10 times with max vg for proper technique, lateral flexion is most painful.     Manual Therapy   Manual Therapy Myofascial release   Myofascial Release Myofascial release and manual stretching to bilateral scapular, trapezius, SCM, cervical region with manual cervical traction to decrease pain and restrictions and improve pain free mobility.                    OT Short Term Goals - 04/20/15 1411    OT SHORT TERM GOAL #1   Title Patient will be educated on a HEP.   Time 4   Period Weeks   Status On-going   OT SHORT TERM GOAL #2   Title Patient will increase strength in her shoulders to 4+/5 for increased abilty to lift pots and pans.    Time 4   Period Weeks   Status On-going   OT SHORT TERM GOAL #3   Title Patient will increase postural alignment by 25% for increased shoulder alignment and decreased pain when working.   Time 4   Period Weeks   Status On-going   OT SHORT TERM GOAL #4   Title Patient will decrease pain to 4/10 in her neck region while working on the computer.   Time 4   Period Weeks   Status On-going   OT SHORT TERM GOAL #5   Title Patient will decrease fascial restrictions to moderate in her cervical region.   Time 4   Period Weeks   Status On-going           OT Long Term Goals - 04/20/15 1411    OT LONG TERM GOAL #  1   Title Patient will be able to complete daily activities and sleep through the night.   Time 8   Period Weeks   Status On-going   OT LONG TERM GOAL #2   Title Patient will improve cervical AROM to [facility] for increased ability to look to right and left when driving.   Time 8   Period Weeks   Status On-going   OT LONG TERM GOAL #3   Title Patient will decrease pain to 2/10 in her cervical region when completing work activiites.    Time 8   Period Weeks   Status On-going   OT LONG TERM GOAL #4   Title Patient will decrease fascial restrictions to minimal in her cervical region.   Time 8   Period Weeks   Status On-going   OT LONG TERM GOAL #5   Title Patient will improve postural stabiity and scapular alignment by 70%.   Time 8   Period Weeks   Status On-going               Plan - 04/27/15 1539    Clinical Impression Statement A:  Patient reports significant decrease in pain this date.  reported independence with HEP.  Required max vg this date with AROM  cervical exercises.    Plan P:  add theraband to extension, row, retraction exercises for increased stability.  Improve independence with cervical exercises.        Problem List Patient Active Problem List   Diagnosis Date Noted  . HIP PAIN 01/12/2009  . SPINAL STENOSIS 01/12/2009  . LOW BACK PAIN 01/12/2009  . SPONDYLOLYSIS 01/12/2009  . SHOULDER PAIN 10/04/2008  . SPONDYLOSIS 10/04/2008  . DIABETES 10/01/2008  . HIGH BLOOD PRESSURE 10/01/2008    Vangie Bicker, OTR/L [phone]  04/27/2015, 3:41 PM  [facility] [address] [place], [place], [address] Phone: [phone]   Fax:  [phone]

## 2015-05-02 ENCOUNTER — Encounter (HOSPITAL_COMMUNITY): Payer: Self-pay

## 2015-05-02 ENCOUNTER — Ambulatory Visit (HOSPITAL_COMMUNITY): Payer: Medicare Other

## 2015-05-02 DIAGNOSIS — M6289 Other specified disorders of muscle: Secondary | ICD-10-CM | POA: Diagnosis not present

## 2015-05-02 DIAGNOSIS — M542 Cervicalgia: Secondary | ICD-10-CM | POA: Diagnosis not present

## 2015-05-02 DIAGNOSIS — I10 Essential (primary) hypertension: Secondary | ICD-10-CM | POA: Diagnosis not present

## 2015-05-02 DIAGNOSIS — E119 Type 2 diabetes mellitus without complications: Secondary | ICD-10-CM | POA: Diagnosis not present

## 2015-05-02 DIAGNOSIS — M629 Disorder of muscle, unspecified: Secondary | ICD-10-CM

## 2015-05-02 NOTE — Therapy (Signed)
[facility] [address] [place], [place], [address] Phone: [phone]   Fax:  [phone]  Occupational Therapy Treatment  Patient Details  Name: [name] MRN: [id number] Date of Birth: June 10, 1929 Referring Provider:  Erline Levine, MD  Encounter Date: 05/02/2015      OT End of Session - 05/02/15 1439    Visit Number 5   Number of Visits 16   Date for OT Re-Evaluation 06/17/15  mini reassess on 5/22   Authorization Type medicare    Authorization Time Period before 10th visit   Authorization - Visit Number 5   Authorization - Number of Visits 10   OT Start Time 1350   OT Stop Time 1430   OT Time Calculation (min) 40 min   Activity Tolerance Patient tolerated treatment well   Behavior During Therapy [facility] for tasks assessed/performed      Past Medical History  Diagnosis Date  . Hypertension   . Diabetes mellitus without complication     Past Surgical History  Procedure Laterality Date  . Abdominal hysterectomy    . Appendectomy    . Breast surgery      begnin tumor removed    There were no vitals filed for this visit.  Visit Diagnosis:  Tight fascia      Subjective Assessment - 05/02/15 1409    Subjective  S: I fell on Wednesday going up some steps.    Currently in Pain? No/denies            [facility] OT Assessment - 05/02/15 1410    Assessment   Diagnosis Cervicalgia   Precautions   Precautions None                  OT Treatments/Exercises (OP) - 05/02/15 1412    Neck Exercises: Stretches   Upper Trapezius Stretch 3 reps;10 seconds   Neck Exercises: Seated   Neck Retraction 1 rep;10 secs   Other Seated Exercise neck flexion, extension, lateral flexion, and rotation side to side 10 times with max vg for proper technique, lateral flexion is most painful.     Shoulder Exercises: Seated   Extension Theraband;10 reps;Limitations   Theraband Level (Shoulder Extension) Level 2 (Red)   Extension Limitations max vc's for proper form and technique   Row Theraband;10 reps   Theraband Level (Shoulder Row) Level 2 (Red)   Other Seated Exercises retraction; red theraband; 12X; 1 set   Shoulder Exercises: ROM/Strengthening   Proximal Shoulder Strengthening, Seated 10X   max vc's for proper form and technique. 4 rest breaks   Manual Therapy   Manual Therapy Myofascial release   Myofascial Release Myofascial release and manual stretching to bilateral scapular, trapezius, SCM, cervical region with manual cervical traction to decrease pain and restrictions and improve pain free mobility.                   OT Short Term Goals - 04/20/15 1411    OT SHORT TERM GOAL #1   Title Patient will be educated on a HEP.   Time 4   Period Weeks   Status On-going   OT SHORT TERM GOAL #2   Title Patient will increase strength in her shoulders to 4+/5 for increased abilty to lift pots and pans.    Time 4   Period Weeks   Status On-going   OT SHORT TERM GOAL #3   Title Patient will increase postural alignment by 25% for increased shoulder alignment  and decreased pain when working.   Time 4   Period Weeks   Status On-going   OT SHORT TERM GOAL #4   Title Patient will decrease pain to 4/10 in her neck region while working on the computer.   Time 4   Period Weeks   Status On-going   OT SHORT TERM GOAL #5   Title Patient will decrease fascial restrictions to moderate in her cervical region.   Time 4   Period Weeks   Status On-going           OT Long Term Goals - 04/20/15 1411    OT LONG TERM GOAL #1   Title Patient will be able to complete daily activities and sleep through the night.   Time 8   Period Weeks   Status On-going   OT LONG TERM GOAL #2   Title Patient will improve cervical AROM to [facility] for increased ability to look to right and left when driving.   Time 8   Period Weeks   Status On-going   OT LONG TERM GOAL #3   Title Patient will decrease pain to 2/10  in her cervical region when completing work activiites.    Time 8   Period Weeks   Status On-going   OT LONG TERM GOAL #4   Title Patient will decrease fascial restrictions to minimal in her cervical region.   Time 8   Period Weeks   Status On-going   OT LONG TERM GOAL #5   Title Patient will improve postural stabiity and scapular alignment by 70%.   Time 8   Period Weeks   Status On-going               Plan - 05/02/15 1433    Clinical Impression Statement A: Added theraband scapular exercises this date. patient required max verbal and physical cues for proper form. Pt continues to require verbal, visual, and physical cues during cervical AROM exercises. Pt needed cues 50% of the time for proper seated form /posture during exercises and when at rest. Compained of fatigue at end of session which may have been caused by therapist giving constant cues for proper form and posture.    Plan P: Cont to work on independence with all exercises. Work on increasing scapular/postural stability.         Problem List Patient Active Problem List   Diagnosis Date Noted  . HIP PAIN 01/12/2009  . SPINAL STENOSIS 01/12/2009  . LOW BACK PAIN 01/12/2009  . SPONDYLOLYSIS 01/12/2009  . SHOULDER PAIN 10/04/2008  . SPONDYLOSIS 10/04/2008  . DIABETES 10/01/2008  . HIGH BLOOD PRESSURE 10/01/2008    Ailene Ravel, OTR/L,CBIS  [phone]  05/02/2015, 2:41 PM  [facility] [address] [place], [place], [address] Phone: [phone]   Fax:  [phone]

## 2015-05-04 ENCOUNTER — Ambulatory Visit (HOSPITAL_COMMUNITY): Payer: Medicare Other

## 2015-05-04 ENCOUNTER — Encounter (HOSPITAL_COMMUNITY): Payer: Self-pay

## 2015-05-04 DIAGNOSIS — M629 Disorder of muscle, unspecified: Secondary | ICD-10-CM

## 2015-05-04 DIAGNOSIS — M6289 Other specified disorders of muscle: Secondary | ICD-10-CM | POA: Diagnosis not present

## 2015-05-04 DIAGNOSIS — E119 Type 2 diabetes mellitus without complications: Secondary | ICD-10-CM | POA: Diagnosis not present

## 2015-05-04 DIAGNOSIS — I10 Essential (primary) hypertension: Secondary | ICD-10-CM | POA: Diagnosis not present

## 2015-05-04 DIAGNOSIS — M542 Cervicalgia: Secondary | ICD-10-CM | POA: Diagnosis not present

## 2015-05-04 DIAGNOSIS — T1490 Injury, unspecified: Secondary | ICD-10-CM | POA: Diagnosis not present

## 2015-05-04 NOTE — Therapy (Signed)
[place], [place], [address] Phone: [phone]   Fax:  [phone]  Occupational Therapy Treatment  Patient Details  Name: [name] MRN: [id number] Date of Birth: 05-30-1929 Referring Provider:  Sharilyn Sites, MD  Encounter Date: 05/04/2015      OT End of Session - 05/04/15 1619    Visit Number 6   Number of Visits 16   Date for OT Re-Evaluation 06/17/15  mini reassess on 5/22   Authorization Type medicare    Authorization Time Period before 10th visit   Authorization - Visit Number 6   Authorization - Number of Visits 10   OT Start Time 1349   OT Stop Time 1434   OT Time Calculation (min) 45 min   Activity Tolerance Patient tolerated treatment well   Behavior During Therapy [facility] for tasks assessed/performed      Past Medical History  Diagnosis Date  . Hypertension   . Diabetes mellitus without complication     Past Surgical History  Procedure Laterality Date  . Abdominal hysterectomy    . Appendectomy    . Breast surgery      begnin tumor removed    There were no vitals filed for this visit.  Visit Diagnosis:  Tight fascia      Subjective Assessment - 05/04/15 1410    Subjective  S: Do you think things are getting better?   Currently in Pain? Yes   Pain Score 4    Pain Location Neck   Pain Orientation Right;Left   Pain Descriptors / Indicators Aching   Pain Type Acute pain            [facility] OT Assessment - 05/04/15 1411    Assessment   Diagnosis Cervicalgia   Precautions   Precautions None                  OT Treatments/Exercises (OP) - 05/04/15 1411    Exercises   Exercises Neck;Shoulder   Neck Exercises: Seated   Other Seated Exercise Levator Scapula Stretch; upper trapezius 10X   Other Seated Exercise Thoracic spine matrix with overhead reaches 10X each arm; max vc's needed.    Shoulder Exercises: Supine   Protraction AROM;10 reps   Horizontal  ABduction AROM;10 reps   Flexion AROM;10 reps                OT Education - 05/04/15 1618    Education provided Yes   Education Details Cervical stretches and thoracic spine matrix with overhead reaches   Person(s) Educated Patient   Methods Explanation;Demonstration;Handout   Comprehension Verbalized understanding;Tactile cues required;Verbal cues required;Need further instruction          OT Short Term Goals - 04/20/15 1411    OT SHORT TERM GOAL #1   Title Patient will be educated on a HEP.   Time 4   Period Weeks   Status On-going   OT SHORT TERM GOAL #2   Title Patient will increase strength in her shoulders to 4+/5 for increased abilty to lift pots and pans.    Time 4   Period Weeks   Status On-going   OT SHORT TERM GOAL #3   Title Patient will increase postural alignment by 25% for increased shoulder alignment and decreased pain when working.   Time 4   Period Weeks   Status On-going   OT SHORT TERM GOAL #4   Title Patient will decrease pain to 4/10 in  her neck region while working on the computer.   Time 4   Period Weeks   Status On-going   OT SHORT TERM GOAL #5   Title Patient will decrease fascial restrictions to moderate in her cervical region.   Time 4   Period Weeks   Status On-going           OT Long Term Goals - 04/20/15 1411    OT LONG TERM GOAL #1   Title Patient will be able to complete daily activities and sleep through the night.   Time 8   Period Weeks   Status On-going   OT LONG TERM GOAL #2   Title Patient will improve cervical AROM to [facility] for increased ability to look to right and left when driving.   Time 8   Period Weeks   Status On-going   OT LONG TERM GOAL #3   Title Patient will decrease pain to 2/10 in her cervical region when completing work activiites.    Time 8   Period Weeks   Status On-going   OT LONG TERM GOAL #4   Title Patient will decrease fascial restrictions to minimal in her cervical region.   Time 8    Period Weeks   Status On-going   OT LONG TERM GOAL #5   Title Patient will improve postural stabiity and scapular alignment by 70%.   Time 8   Period Weeks   Status On-going               Plan - 05/04/15 1619    Clinical Impression Statement A: Pt continues to have poor body awareness and carry over with exercises. Patient is given verbal, visual, and tactile cues for all exercises with poor demonstration. Pt requested new exercises to take home. Discussed difficulty with exercises and how we don't want patient to perform the exercises with incorrect form because we won't be strengthening or focusing on the correct muscles. Pt states that she does notice an improvement since she's been coming to therapy.    Plan P: Cont to focus on proper form as patient has poor body awareness. Using a floor length mirror may be helpful. Cont to work on increasing scapular/postural stability.         Problem List Patient Active Problem List   Diagnosis Date Noted  . HIP PAIN 01/12/2009  . SPINAL STENOSIS 01/12/2009  . LOW BACK PAIN 01/12/2009  . SPONDYLOLYSIS 01/12/2009  . SHOULDER PAIN 10/04/2008  . SPONDYLOSIS 10/04/2008  . DIABETES 10/01/2008  . HIGH BLOOD PRESSURE 10/01/2008    Ailene Ravel, OTR/L,CBIS  [phone]  05/04/2015, 4:24 PM  [facility] [address] [place], [place], [address] Phone: [phone]   Fax:  [phone]

## 2015-05-04 NOTE — Patient Instructions (Addendum)
Thoracic Spine Matrix with Overhead Reaches                Levator Scapula Stretch      Upper Trapezius

## 2015-05-09 ENCOUNTER — Ambulatory Visit (HOSPITAL_COMMUNITY): Payer: Medicare Other | Admitting: Specialist

## 2015-05-11 ENCOUNTER — Encounter (HOSPITAL_COMMUNITY): Payer: PRIVATE HEALTH INSURANCE | Admitting: Specialist

## 2015-05-16 ENCOUNTER — Encounter (HOSPITAL_COMMUNITY): Payer: Self-pay | Admitting: Occupational Therapy

## 2015-05-16 ENCOUNTER — Ambulatory Visit (HOSPITAL_COMMUNITY): Payer: Medicare Other | Admitting: Occupational Therapy

## 2015-05-16 DIAGNOSIS — M6289 Other specified disorders of muscle: Secondary | ICD-10-CM

## 2015-05-16 DIAGNOSIS — M542 Cervicalgia: Secondary | ICD-10-CM

## 2015-05-16 DIAGNOSIS — M629 Disorder of muscle, unspecified: Secondary | ICD-10-CM

## 2015-05-16 DIAGNOSIS — E119 Type 2 diabetes mellitus without complications: Secondary | ICD-10-CM | POA: Diagnosis not present

## 2015-05-16 DIAGNOSIS — I10 Essential (primary) hypertension: Secondary | ICD-10-CM | POA: Diagnosis not present

## 2015-05-16 NOTE — Therapy (Signed)
[place], [place], [address] Phone: [phone]   Fax:  [phone]  Occupational Therapy Reassessment & Treatment  Patient Details  Name: [name] MRN: [id number] Date of Birth: September 10, 1929 Referring Provider:  Erline Levine, MD  Encounter Date: 05/16/2015      OT End of Session - 05/16/15 1432    Visit Number 7   Number of Visits 16   Date for OT Re-Evaluation 06/17/15   Authorization Type medicare    Authorization Time Period before 10th visit   Authorization - Visit Number 7   Authorization - Number of Visits 10   OT Start Time 3329   OT Stop Time 1430   OT Time Calculation (min) 45 min   Activity Tolerance Patient tolerated treatment well   Behavior During Therapy [facility] for tasks assessed/performed      Past Medical History  Diagnosis Date  . Hypertension   . Diabetes mellitus without complication     Past Surgical History  Procedure Laterality Date  . Abdominal hysterectomy    . Appendectomy    . Breast surgery      begnin tumor removed    There were no vitals filed for this visit.  Visit Diagnosis:  Tight fascia  Cervicalgia      Subjective Assessment - 05/16/15 1343    Subjective  S: My left side is hurting more than my right side today.    Currently in Pain? Yes   Pain Score 4    Pain Location Neck   Pain Orientation Left   Pain Descriptors / Indicators Aching   Pain Type Acute pain           [facility] OT Assessment - 05/16/15 1343    Assessment   Diagnosis Cervicalgia   Precautions   Precautions None   ROM / Strength   AROM / PROM / Strength AROM   Palpation   Palpation comment Mod fascial restrictions in cervical, SCM, scapular, trapezius regions   AROM   AROM Assessment Site Cervical   Cervical Flexion 6cm  18 previous   Cervical Extension 7cm  12 previous   Cervical - Right Side Bend 10cm  same as previous   Cervical - Left Side Bend 12cm  same as  previous   Cervical - Right Rotation 14cm  41 previous   Cervical - Left Rotation 15cm  30 previous                  OT Treatments/Exercises (OP) - 05/16/15 1408    Exercises   Exercises Neck;Shoulder   Neck Exercises: Stretches   Upper Trapezius Stretch 3 reps;10 seconds   Levator Stretch 3 reps;10 seconds   Lower Cervical/Upper Thoracic Stretch 2 reps;10 seconds   Neck Exercises: Seated   Neck Retraction 1 rep;10 secs   Shoulder Exercises: Supine   Protraction AROM;10 reps   Horizontal ABduction AROM;10 reps   Flexion AROM;10 reps   Shoulder Exercises: Seated   Extension Theraband;10 reps;Limitations   Theraband Level (Shoulder Extension) Level 2 (Red)   Extension Limitations max vc's for proper form and technique   Row Theraband;10 reps   Theraband Level (Shoulder Row) Level 2 (Red)   Shoulder Exercises: ROM/Strengthening   Proximal Shoulder Strengthening, Seated 10X    Manual Therapy   Manual Therapy Myofascial release   Myofascial Release Myofascial release and manual stretching to bilateral scapular, trapezius, SCM, cervical region with manual cervical traction to decrease pain and restrictions  and improve pain free mobility.                  OT Short Term Goals - 05/16/15 1421    OT SHORT TERM GOAL #1   Title Patient will be educated on a HEP.   Time 4   Period Weeks   Status Achieved   OT SHORT TERM GOAL #2   Title Patient will increase strength in her shoulders to 4+/5 for increased abilty to lift pots and pans.    Time 4   Period Weeks   Status On-going   OT SHORT TERM GOAL #3   Title Patient will increase postural alignment by 25% for increased shoulder alignment and decreased pain when working.   Time 4   Period Weeks   Status On-going   OT SHORT TERM GOAL #4   Title Patient will decrease pain to 4/10 in her neck region while working on the computer.   Time 4   Period Weeks   Status On-going   OT SHORT TERM GOAL #5   Title  Patient will decrease fascial restrictions to moderate in her cervical region.   Time 4   Period Weeks   Status Achieved           OT Long Term Goals - 05/16/15 1423    OT LONG TERM GOAL #1   Title Patient will be able to complete daily activities and sleep through the night.   Time 8   Period Weeks   Status On-going   OT LONG TERM GOAL #2   Title Patient will improve cervical AROM to [facility] for increased ability to look to right and left when driving.   Time 8   Period Weeks   Status On-going   OT LONG TERM GOAL #3   Title Patient will decrease pain to 2/10 in her cervical region when completing work activiites.    Time 8   Period Weeks   Status On-going   OT LONG TERM GOAL #4   Title Patient will decrease fascial restrictions to minimal in her cervical region.   Time 8   Period Weeks   Status On-going   OT LONG TERM GOAL #5   Title Patient will improve postural stabiity and scapular alignment by 70%.   Time 8   Period Weeks   Status On-going               Plan - 05/16/15 1432    Clinical Impression Statement A: Mini-reassessment completed this date. Pt has met 2/5 STGs and is progressing towards LTGs. Pt reports she feels like she has more rotation in her neck, which her measurements correspond with. 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 Continued cervical stretches and exercises this date, pt continues to require max verbal cuing during exercises for proper form.    Plan P: Use mirror during exercises. Continue focusing on correct form during scapular & postural stability exercises.         Problem List Patient Active Problem List   Diagnosis Date Noted  . HIP PAIN 01/12/2009  . SPINAL STENOSIS 01/12/2009  . LOW BACK PAIN 01/12/2009  . SPONDYLOLYSIS 01/12/2009  . SHOULDER PAIN 10/04/2008  . SPONDYLOSIS 10/04/2008  . DIABETES 10/01/2008  . HIGH BLOOD PRESSURE 10/01/2008    Guadelupe Sabin, OTR/L   [phone]  05/16/2015, 2:38 PM  [facility] [address] [place], [place], [address] Phone: [phone]  Fax:  [phone]

## 2015-05-18 ENCOUNTER — Encounter (HOSPITAL_COMMUNITY): Payer: Self-pay | Admitting: Occupational Therapy

## 2015-05-18 ENCOUNTER — Ambulatory Visit (HOSPITAL_COMMUNITY): Payer: Medicare Other | Admitting: Occupational Therapy

## 2015-05-18 DIAGNOSIS — M542 Cervicalgia: Secondary | ICD-10-CM

## 2015-05-18 DIAGNOSIS — M6289 Other specified disorders of muscle: Secondary | ICD-10-CM | POA: Diagnosis not present

## 2015-05-18 DIAGNOSIS — M629 Disorder of muscle, unspecified: Secondary | ICD-10-CM

## 2015-05-18 DIAGNOSIS — E119 Type 2 diabetes mellitus without complications: Secondary | ICD-10-CM | POA: Diagnosis not present

## 2015-05-18 DIAGNOSIS — I10 Essential (primary) hypertension: Secondary | ICD-10-CM | POA: Diagnosis not present

## 2015-05-18 NOTE — Therapy (Signed)
[place], [place], [address] Phone: [phone]   Fax:  [phone]  Occupational Therapy Treatment  Patient Details  Name: [name] MRN: [id number] Date of Birth: 1929/08/28 Referring Provider:  Sharilyn Sites, MD  Encounter Date: 05/18/2015      OT End of Session - 05/18/15 1405    Visit Number 8   Number of Visits 16   Date for OT Re-Evaluation 06/17/15   Authorization Type medicare    Authorization Time Period before 10th visit   Authorization - Visit Number 8   Authorization - Number of Visits 10   OT Start Time 1346   OT Stop Time 1402   OT Time Calculation (min) 16 min   Activity Tolerance Patient tolerated treatment well   Behavior During Therapy [facility] for tasks assessed/performed      Past Medical History  Diagnosis Date  . Hypertension   . Diabetes mellitus without complication     Past Surgical History  Procedure Laterality Date  . Abdominal hysterectomy    . Appendectomy    . Breast surgery      begnin tumor removed    There were no vitals filed for this visit.  Visit Diagnosis:  Tight fascia  Cervicalgia      Subjective Assessment - 05/18/15 1349    Subjective  S: I have a headache today, otherwise no pain.    Currently in Pain? No/denies            [facility] OT Assessment - 05/18/15 1348    Assessment   Diagnosis Cervicalgia   Precautions   Precautions None                  OT Treatments/Exercises (OP) - 05/18/15 1349    Exercises   Exercises Neck;Shoulder   Manual Therapy   Manual Therapy Myofascial release   Myofascial Release Myofascial release and manual stretching to bilateral scapular, trapezius, SCM, cervical region with manual cervical traction to decrease pain and restrictions and improve pain free mobility.                   OT Short Term Goals - 05/16/15 1421    OT SHORT TERM GOAL #1   Title Patient will be educated on a  HEP.   Time 4   Period Weeks   Status Achieved   OT SHORT TERM GOAL #2   Title Patient will increase strength in her shoulders to 4+/5 for increased abilty to lift pots and pans.    Time 4   Period Weeks   Status On-going   OT SHORT TERM GOAL #3   Title Patient will increase postural alignment by 25% for increased shoulder alignment and decreased pain when working.   Time 4   Period Weeks   Status On-going   OT SHORT TERM GOAL #4   Title Patient will decrease pain to 4/10 in her neck region while working on the computer.   Time 4   Period Weeks   Status On-going   OT SHORT TERM GOAL #5   Title Patient will decrease fascial restrictions to moderate in her cervical region.   Time 4   Period Weeks   Status Achieved           OT Long Term Goals - 05/16/15 1423    OT LONG TERM GOAL #1   Title Patient will be able to complete daily activities and sleep through the night.  Time 8   Period Weeks   Status On-going   OT LONG TERM GOAL #2   Title Patient will improve cervical AROM to [facility] for increased ability to look to right and left when driving.   Time 8   Period Weeks   Status On-going   OT LONG TERM GOAL #3   Title Patient will decrease pain to 2/10 in her cervical region when completing work activiites.    Time 8   Period Weeks   Status On-going   OT LONG TERM GOAL #4   Title Patient will decrease fascial restrictions to minimal in her cervical region.   Time 8   Period Weeks   Status On-going   OT LONG TERM GOAL #5   Title Patient will improve postural stabiity and scapular alignment by 70%.   Time 8   Period Weeks   Status On-going               Plan - 05/18/15 1406    Clinical Impression Statement A: Myofascial release completed today. Pt complained of headache at beginning of session, requested to leave early due to oncoming migraine and needed to drive home before it became worse.    Plan P: UPDATE G-CODE. Use mirror during exercises. Continue  focusing on correct form during scapular and postural stability exercises.         Problem List Patient Active Problem List   Diagnosis Date Noted  . HIP PAIN 01/12/2009  . SPINAL STENOSIS 01/12/2009  . LOW BACK PAIN 01/12/2009  . SPONDYLOLYSIS 01/12/2009  . SHOULDER PAIN 10/04/2008  . SPONDYLOSIS 10/04/2008  . DIABETES 10/01/2008  . HIGH BLOOD PRESSURE 10/01/2008    Guadelupe Sabin, OTR/L  [phone]  05/18/2015, 2:08 PM  [place] [address] [place], [place], [address] Phone: [phone]   Fax:  [phone]

## 2015-05-24 ENCOUNTER — Ambulatory Visit (HOSPITAL_COMMUNITY): Payer: Medicare Other

## 2015-05-24 DIAGNOSIS — I10 Essential (primary) hypertension: Secondary | ICD-10-CM | POA: Diagnosis not present

## 2015-05-24 DIAGNOSIS — E119 Type 2 diabetes mellitus without complications: Secondary | ICD-10-CM | POA: Diagnosis not present

## 2015-05-24 DIAGNOSIS — M542 Cervicalgia: Secondary | ICD-10-CM | POA: Diagnosis not present

## 2015-05-24 DIAGNOSIS — M6289 Other specified disorders of muscle: Secondary | ICD-10-CM | POA: Diagnosis not present

## 2015-05-24 DIAGNOSIS — M629 Disorder of muscle, unspecified: Secondary | ICD-10-CM

## 2015-05-24 NOTE — Therapy (Signed)
[place], [place], [address] Phone: [phone]   Fax:  [phone]  Occupational Therapy Treatment  Patient Details  Name: [name] MRN: [id number] Date of Birth: 12/01/1929 Referring Provider:  Erline Levine, MD  Encounter Date: 05/24/2015      OT End of Session - 05/24/15 1423    Visit Number 9   Number of Visits 16   Date for OT Re-Evaluation 06/17/15   Authorization Type medicare    Authorization Time Period before 19th visit   Authorization - Visit Number 9   Authorization - Number of Visits 19   OT Start Time 6734   OT Stop Time 1430   OT Time Calculation (min) 41 min   Activity Tolerance Patient tolerated treatment well   Behavior During Therapy [facility] for tasks assessed/performed      Past Medical History  Diagnosis Date  . Hypertension   . Diabetes mellitus without complication     Past Surgical History  Procedure Laterality Date  . Abdominal hysterectomy    . Appendectomy    . Breast surgery      begnin tumor removed    There were no vitals filed for this visit.  Visit Diagnosis:  Tight fascia      Subjective Assessment - 05/24/15 1511    Subjective  S: My neck is hurting worse on the left side.   Pain Score 4    Pain Location Neck   Pain Orientation Left   Pain Descriptors / Indicators Aching   Pain Type Acute pain            [facility] OT Assessment - 05/24/15 1408    Assessment   Diagnosis Cervicalgia   Precautions   Precautions None                  OT Treatments/Exercises (OP) - 05/24/15 1408    Exercises   Exercises Neck;Shoulder   Neck Exercises: Seated   Neck Retraction 10 reps   Cervical Rotation Right;Left;5 reps   Lateral Flexion Both;10 reps   Shoulder Shrugs 10 reps   Shoulder Rolls Backwards;10 reps   Shoulder Flexion Both;10 reps   Other Seated Exercise cervical flexion/extensnion; 12X   Shoulder Exercises: Seated   Retraction  Theraband;12 reps   Theraband Level (Shoulder Retraction) Level 2 (Red)   Row AROM;12 reps   Shoulder Exercises: ROM/Strengthening   UBE (Upper Arm Bike) Level 1 2' forward 2' reverse    Manual Therapy   Manual Therapy Myofascial release   Myofascial Release Myofascial release and manual stretching to bilateral scapular, trapezius, SCM, cervical region with manual cervical traction to decrease pain and restrictions and improve pain free mobility.                   OT Short Term Goals - 05/24/15 1516    OT SHORT TERM GOAL #1   Title Patient will be educated on a HEP.   Time 4   Period Weeks   OT SHORT TERM GOAL #2   Title Patient will increase strength in her shoulders to 4+/5 for increased abilty to lift pots and pans.    Time 4   Period Weeks   Status On-going   OT SHORT TERM GOAL #3   Title Patient will increase postural alignment by 25% for increased shoulder alignment and decreased pain when working.   Time 4   Period Weeks   Status On-going   OT SHORT  TERM GOAL #4   Title Patient will decrease pain to 4/10 in her neck region while working on the computer.   Time 4   Period Weeks   Status On-going   OT SHORT TERM GOAL #5   Title Patient will decrease fascial restrictions to moderate in her cervical region.   Time 4   Period Weeks           OT Long Term Goals - 05/16/15 1423    OT LONG TERM GOAL #1   Title Patient will be able to complete daily activities and sleep through the night.   Time 8   Period Weeks   Status On-going   OT LONG TERM GOAL #2   Title Patient will improve cervical AROM to [facility] for increased ability to look to right and left when driving.   Time 8   Period Weeks   Status On-going   OT LONG TERM GOAL #3   Title Patient will decrease pain to 2/10 in her cervical region when completing work activiites.    Time 8   Period Weeks   Status On-going   OT LONG TERM GOAL #4   Title Patient will decrease fascial restrictions to minimal in  her cervical region.   Time 8   Period Weeks   Status On-going   OT LONG TERM GOAL #5   Title Patient will improve postural stabiity and scapular alignment by 70%.   Time 8   Period Weeks   Status On-going               Plan - 05/31/2015 1512    Clinical Impression Statement A: Pt had min-mod difficulty completing exercises with correct form. Pt completes approx. 50% correctly. Updated G code. Added UBE bike to increase shoulder stabilizing muscles.   Plan P: Cont to focus on correct form during exercises. Use mirror if needed to help.           G-Codes - May 31, 2015 1514    Functional Assessment Tool Used FOTO score: 57/100 (43% impaired)   Functional Limitation Self care   Self Care Current Status (W0981) At least 40 percent but less than 60 percent impaired, limited or restricted   Self Care Goal Status (X9147) At least 20 percent but less than 40 percent impaired, limited or restricted      Problem List Patient Active Problem List   Diagnosis Date Noted  . HIP PAIN 01/12/2009  . SPINAL STENOSIS 01/12/2009  . LOW BACK PAIN 01/12/2009  . SPONDYLOLYSIS 01/12/2009  . SHOULDER PAIN 10/04/2008  . SPONDYLOSIS 10/04/2008  . DIABETES 10/01/2008  . HIGH BLOOD PRESSURE 10/01/2008    Ailene Ravel, OTR/L,CBIS  [phone]  May 31, 2015, 3:16 PM  [place] [address] [place], [place], [address] Phone: [phone]   Fax:  [phone]

## 2015-05-26 ENCOUNTER — Encounter (HOSPITAL_COMMUNITY): Payer: PRIVATE HEALTH INSURANCE

## 2015-05-31 ENCOUNTER — Ambulatory Visit (HOSPITAL_COMMUNITY): Payer: Medicare Other | Attending: Neurosurgery

## 2015-05-31 DIAGNOSIS — E119 Type 2 diabetes mellitus without complications: Secondary | ICD-10-CM | POA: Diagnosis not present

## 2015-05-31 DIAGNOSIS — M542 Cervicalgia: Secondary | ICD-10-CM | POA: Diagnosis not present

## 2015-05-31 DIAGNOSIS — M6289 Other specified disorders of muscle: Secondary | ICD-10-CM

## 2015-05-31 DIAGNOSIS — I10 Essential (primary) hypertension: Secondary | ICD-10-CM | POA: Insufficient documentation

## 2015-05-31 DIAGNOSIS — R29898 Other symptoms and signs involving the musculoskeletal system: Secondary | ICD-10-CM

## 2015-05-31 DIAGNOSIS — M629 Disorder of muscle, unspecified: Secondary | ICD-10-CM

## 2015-05-31 NOTE — Therapy (Signed)
[place], [place], [address] Phone: [phone]   Fax:  [phone]  Occupational Therapy Treatment  Patient Details  Name: JOSEFINA RYNDERS MRN: [id number] Date of Birth: 1929-02-10 Referring Provider:  Sharilyn Sites, MD  Encounter Date: 05/31/2015      OT End of Session - 05/31/15 1057    Visit Number 10   Number of Visits 16   Date for OT Re-Evaluation 06/17/15   Authorization Type medicare    Authorization Time Period before 19th visit   Authorization - Visit Number 10   Authorization - Number of Visits 19   OT Start Time 1021   OT Stop Time 1100   OT Time Calculation (min) 39 min   Activity Tolerance Patient tolerated treatment well   Behavior During Therapy [facility] for tasks assessed/performed      Past Medical History  Diagnosis Date  . Hypertension   . Diabetes mellitus without complication     Past Surgical History  Procedure Laterality Date  . Abdominal hysterectomy    . Appendectomy    . Breast surgery      begnin tumor removed    There were no vitals filed for this visit.  Visit Diagnosis:  Tight fascia  Shoulder weakness          [facility] OT Assessment - 05/31/15 1053    Assessment   Diagnosis Cervicalgia   Precautions   Precautions None                  OT Treatments/Exercises (OP) - 05/31/15 1053    Exercises   Exercises Shoulder   Shoulder Exercises: Supine   Protraction AROM;10 reps   Horizontal ABduction AROM;10 reps   External Rotation AROM;10 reps   Internal Rotation AROM;10 reps   Flexion AROM;10 reps   ABduction AROM;10 reps   Shoulder Exercises: Seated   Protraction AROM;10 reps   Horizontal ABduction AROM;10 reps   External Rotation AROM;10 reps   Internal Rotation AROM;10 reps   Flexion AROM;10 reps   Abduction AROM;10 reps   Shoulder Exercises: ROM/Strengthening   UBE (Upper Arm Bike) Level 1 2' forward 2' reverse    Manual Therapy   Manual Therapy Myofascial release   Myofascial Release Myofascial release and manual stretching to bilateral scapular, trapezius, SCM, cervical region with manual cervical traction to decrease pain and restrictions and improve pain free mobility.                   OT Short Term Goals - 05/24/15 1516    OT SHORT TERM GOAL #1   Title Patient will be educated on a HEP.   Time 4   Period Weeks   OT SHORT TERM GOAL #2   Title Patient will increase strength in her shoulders to 4+/5 for increased abilty to lift pots and pans.    Time 4   Period Weeks   Status On-going   OT SHORT TERM GOAL #3   Title Patient will increase postural alignment by 25% for increased shoulder alignment and decreased pain when working.   Time 4   Period Weeks   Status On-going   OT SHORT TERM GOAL #4   Title Patient will decrease pain to 4/10 in her neck region while working on the computer.   Time 4   Period Weeks   Status On-going   OT SHORT TERM GOAL #5   Title Patient will decrease fascial restrictions to moderate in  her cervical region.   Time 4   Period Weeks           OT Long Term Goals - 05/16/15 1423    OT LONG TERM GOAL #1   Title Patient will be able to complete daily activities and sleep through the night.   Time 8   Period Weeks   Status On-going   OT LONG TERM GOAL #2   Title Patient will improve cervical AROM to [facility] for increased ability to look to right and left when driving.   Time 8   Period Weeks   Status On-going   OT LONG TERM GOAL #3   Title Patient will decrease pain to 2/10 in her cervical region when completing work activiites.    Time 8   Period Weeks   Status On-going   OT LONG TERM GOAL #4   Title Patient will decrease fascial restrictions to minimal in her cervical region.   Time 8   Period Weeks   Status On-going   OT LONG TERM GOAL #5   Title Patient will improve postural stabiity and scapular alignment by 70%.   Time 8   Period Weeks   Status  On-going               Plan - 05/31/15 1058    Clinical Impression Statement A: Initiated shoulder strengthening exercises. Attempted 1# handweight supine but to much of a challenge. Completed AROM supine and seated instead. Patient needed min cues for correct form.    Plan P: Cont with shoulder strengthening exercises. Add X to V arms. Increase time with UBE if able.         Problem List Patient Active Problem List   Diagnosis Date Noted  . HIP PAIN 01/12/2009  . SPINAL STENOSIS 01/12/2009  . LOW BACK PAIN 01/12/2009  . SPONDYLOLYSIS 01/12/2009  . SHOULDER PAIN 10/04/2008  . SPONDYLOSIS 10/04/2008  . DIABETES 10/01/2008  . HIGH BLOOD PRESSURE 10/01/2008    Ailene Ravel, OTR/L,CBIS  [phone]  05/31/2015, 12:16 PM  [place] [address] [place], [place], [address] Phone: [phone]   Fax:  [phone]

## 2015-05-31 NOTE — Patient Instructions (Signed)
1) Shoulder Protraction    Begin with elbows by your side, slowly "punch" straight out in front of you. Repeat _10__times, ____set/day.     2) Shoulder Flexion  Supine:     Standing:         Begin with arms at your side with thumbs pointed up, slowly raise both arms up and forward towards overhead. Repeat10___times, ___set/day.               3) Horizontal abduction/adduction  Supine:   Standing:           Begin with arms straight out in front of you, bring out to the side in at "T" shape. Keep arms straight entire time. Repeat _10___times, ____sets/day.       4) Internal & External Rotation    *No band* -Stand with elbows at the side and elbows bent 90 degrees. Move your forearms away from your body, then bring back inward toward the body.  Repeat 10___times, ___sets/day    5) Shoulder Abduction  Supine:     Standing:       Lying on your back begin with your arms flat on the table next to your side. Slowly move your arms out to the side so that they go overhead, in a jumping jack or snow angel movement. Repeat _10__times, ___sets/day    

## 2015-06-02 ENCOUNTER — Encounter (HOSPITAL_COMMUNITY): Payer: Self-pay

## 2015-06-02 ENCOUNTER — Ambulatory Visit (HOSPITAL_COMMUNITY): Payer: Medicare Other

## 2015-06-02 DIAGNOSIS — E119 Type 2 diabetes mellitus without complications: Secondary | ICD-10-CM | POA: Diagnosis not present

## 2015-06-02 DIAGNOSIS — M6289 Other specified disorders of muscle: Secondary | ICD-10-CM | POA: Diagnosis not present

## 2015-06-02 DIAGNOSIS — R29898 Other symptoms and signs involving the musculoskeletal system: Secondary | ICD-10-CM

## 2015-06-02 DIAGNOSIS — I10 Essential (primary) hypertension: Secondary | ICD-10-CM | POA: Diagnosis not present

## 2015-06-02 DIAGNOSIS — M629 Disorder of muscle, unspecified: Secondary | ICD-10-CM

## 2015-06-02 DIAGNOSIS — M542 Cervicalgia: Secondary | ICD-10-CM | POA: Diagnosis not present

## 2015-06-02 NOTE — Therapy (Signed)
[place], [place], [address] Phone: [phone]   Fax:  [phone]  Occupational Therapy Treatment  Patient Details  Name: CALLYN SEVERTSON MRN: [id number] Date of Birth: 09-16-1929 Referring Provider:  Sharilyn Sites, MD  Encounter Date: 06/02/2015      OT End of Session - 06/02/15 1603    Visit Number 11   Number of Visits 16   Date for OT Re-Evaluation 06/17/15   Authorization Type medicare    Authorization Time Period before 19th visit   Authorization - Visit Number 11   Authorization - Number of Visits 19   OT Start Time 1440   OT Stop Time 1518   OT Time Calculation (min) 38 min   Activity Tolerance Patient tolerated treatment well   Behavior During Therapy [facility] for tasks assessed/performed      Past Medical History  Diagnosis Date  . Hypertension   . Diabetes mellitus without complication     Past Surgical History  Procedure Laterality Date  . Abdominal hysterectomy    . Appendectomy    . Breast surgery      begnin tumor removed    There were no vitals filed for this visit.  Visit Diagnosis:  Tight fascia  Shoulder weakness      Subjective Assessment - 06/02/15 1500    Subjective  S: I've been running around like crazy.   Currently in Pain? No/denies            [facility] OT Assessment - 06/02/15 1501    Assessment   Diagnosis Cervicalgia   Precautions   Precautions None                  OT Treatments/Exercises (OP) - 06/02/15 1501    Exercises   Exercises Shoulder   Shoulder Exercises: Supine   Protraction AROM;10 reps   Horizontal ABduction AROM;10 reps   External Rotation AROM;10 reps   Internal Rotation AROM;10 reps   Flexion AROM;10 reps   ABduction AROM;10 reps   Shoulder Exercises: Seated   Protraction AROM;10 reps   Horizontal ABduction AROM;10 reps   External Rotation AROM;10 reps   Internal Rotation AROM;10 reps   Flexion AROM;10 reps   Abduction AROM;10 reps   Shoulder Exercises: ROM/Strengthening   UBE (Upper Arm Bike) Level 1 2' forward 2' reverse    X to V Arms 10X   Proximal Shoulder Strengthening, Seated 10X    Manual Therapy   Manual Therapy Myofascial release   Myofascial Release Myofascial release and manual stretching to bilateral scapular, trapezius, SCM, cervical region with manual cervical traction to decrease pain and restrictions and improve pain free mobility.                   OT Short Term Goals - 05/24/15 1516    OT SHORT TERM GOAL #1   Title Patient will be educated on a HEP.   Time 4   Period Weeks   OT SHORT TERM GOAL #2   Title Patient will increase strength in her shoulders to 4+/5 for increased abilty to lift pots and pans.    Time 4   Period Weeks   Status On-going   OT SHORT TERM GOAL #3   Title Patient will increase postural alignment by 25% for increased shoulder alignment and decreased pain when working.   Time 4   Period Weeks   Status On-going   OT SHORT TERM GOAL #4   Title  Patient will decrease pain to 4/10 in her neck region while working on the computer.   Time 4   Period Weeks   Status On-going   OT SHORT TERM GOAL #5   Title Patient will decrease fascial restrictions to moderate in her cervical region.   Time 4   Period Weeks           OT Long Term Goals - 05/16/15 1423    OT LONG TERM GOAL #1   Title Patient will be able to complete daily activities and sleep through the night.   Time 8   Period Weeks   Status On-going   OT LONG TERM GOAL #2   Title Patient will improve cervical AROM to [facility] for increased ability to look to right and left when driving.   Time 8   Period Weeks   Status On-going   OT LONG TERM GOAL #3   Title Patient will decrease pain to 2/10 in her cervical region when completing work activiites.    Time 8   Period Weeks   Status On-going   OT LONG TERM GOAL #4   Title Patient will decrease fascial restrictions to minimal in  her cervical region.   Time 8   Period Weeks   Status On-going   OT LONG TERM GOAL #5   Title Patient will improve postural stabiity and scapular alignment by 70%.   Time 8   Period Weeks   Status On-going               Plan - 06/02/15 1603    Clinical Impression Statement A: Added X to V arms. patient tolerated well. Fascial restrctions appear to have reduced to trace amount this date.    Plan P: Cont with shoulder strengthening exercises. Increase time with UBE bike if able.         Problem List Patient Active Problem List   Diagnosis Date Noted  . HIP PAIN 01/12/2009  . SPINAL STENOSIS 01/12/2009  . LOW BACK PAIN 01/12/2009  . SPONDYLOLYSIS 01/12/2009  . SHOULDER PAIN 10/04/2008  . SPONDYLOSIS 10/04/2008  . DIABETES 10/01/2008  . HIGH BLOOD PRESSURE 10/01/2008   Ailene Ravel, OTR/L,CBIS  [phone]  06/02/2015, 4:05 PM  [place] [name] [place], [place], [address] Phone: [phone]   Fax:  [phone]

## 2015-06-07 DIAGNOSIS — L851 Acquired keratosis [keratoderma] palmaris et plantaris: Secondary | ICD-10-CM | POA: Diagnosis not present

## 2015-06-07 DIAGNOSIS — E1142 Type 2 diabetes mellitus with diabetic polyneuropathy: Secondary | ICD-10-CM | POA: Diagnosis not present

## 2015-06-07 DIAGNOSIS — B351 Tinea unguium: Secondary | ICD-10-CM | POA: Diagnosis not present

## 2015-06-08 ENCOUNTER — Ambulatory Visit (HOSPITAL_COMMUNITY): Payer: Medicare Other | Admitting: Specialist

## 2015-06-08 DIAGNOSIS — R29898 Other symptoms and signs involving the musculoskeletal system: Secondary | ICD-10-CM

## 2015-06-08 DIAGNOSIS — I10 Essential (primary) hypertension: Secondary | ICD-10-CM | POA: Diagnosis not present

## 2015-06-08 DIAGNOSIS — M629 Disorder of muscle, unspecified: Secondary | ICD-10-CM

## 2015-06-08 DIAGNOSIS — M6289 Other specified disorders of muscle: Secondary | ICD-10-CM

## 2015-06-08 DIAGNOSIS — M542 Cervicalgia: Secondary | ICD-10-CM

## 2015-06-08 DIAGNOSIS — E119 Type 2 diabetes mellitus without complications: Secondary | ICD-10-CM | POA: Diagnosis not present

## 2015-06-08 NOTE — Therapy (Signed)
[place], [place], [address] Phone: [phone]   Fax:  [phone]  Occupational Therapy Treatment  Patient Details  Name: [name] MRN: [id number] Date of Birth: [birth year] Referring Provider:  Erline Levine, MD  Encounter Date: 06/08/2015      OT End of Session - 06/08/15 1419    Visit Number 12   Number of Visits 16   Date for OT Re-Evaluation 06/17/15   Authorization Type medicare    Authorization Time Period 12   Authorization - Number of Visits 19   OT Start Time 1022   OT Stop Time 1055   OT Time Calculation (min) 33 min   Activity Tolerance Patient tolerated treatment well   Behavior During Therapy [facility] for tasks assessed/performed      Past Medical History  Diagnosis Date  . Hypertension   . Diabetes mellitus without complication     Past Surgical History  Procedure Laterality Date  . Abdominal hysterectomy    . Appendectomy    . Breast surgery      begnin tumor removed    There were no vitals filed for this visit.  Visit Diagnosis:  Tight fascia  Cervicalgia  Shoulder weakness      Subjective Assessment - 06/08/15 1023    Subjective  S:  I have some pain.  It is getting better.   Currently in Pain? Yes   Pain Score 3    Pain Location Neck   Pain Orientation Right;Left   Pain Descriptors / Indicators Aching            [facility] OT Assessment - 06/08/15 0001    Assessment   Diagnosis Cervicalgia   Precautions   Precautions None                  OT Treatments/Exercises (OP) - 06/08/15 0001    Exercises   Exercises Shoulder   Shoulder Exercises: Supine   Protraction PROM;Strengthening;10 reps   Protraction Weight (lbs) 1   Horizontal ABduction PROM;Strengthening;10 reps   Horizontal ABduction Weight (lbs) 1   External Rotation PROM;Strengthening;10 reps   External Rotation Weight (lbs) 1   Internal Rotation PROM;Strengthening;10 reps   Internal Rotation Weight (lbs) 1   Flexion PROM;Strengthening;10 reps   Shoulder Flexion Weight (lbs) 1   ABduction PROM;Strengthening;10 reps   Shoulder ABduction Weight (lbs) 1   Shoulder Exercises: ROM/Strengthening   UBE (Upper Arm Bike) unavailable   X to V Arms 10X mod verbal guidance for technique   Proximal Shoulder Strengthening, Supine 10 times with 1# required 1 extended rest break due to arms fatiguing.   Manual Therapy   Manual Therapy Myofascial release   Myofascial Release Myofascial release and manual stretching to bilateral scapular, trapezius, SCM, cervical region with manual cervical traction to decrease pain and restrictions and improve pain free mobility.                   OT Short Term Goals - 05/24/15 1516    OT SHORT TERM GOAL #1   Title Patient will be educated on a HEP.   Time 4   Period Weeks   OT SHORT TERM GOAL #2   Title Patient will increase strength in her shoulders to 4+/5 for increased abilty to lift pots and pans.    Time 4   Period Weeks   Status On-going   OT SHORT TERM GOAL #3   Title Patient will increase postural alignment  by 25% for increased shoulder alignment and decreased pain when working.   Time 4   Period Weeks   Status On-going   OT SHORT TERM GOAL #4   Title Patient will decrease pain to 4/10 in her neck region while working on the computer.   Time 4   Period Weeks   Status On-going   OT SHORT TERM GOAL #5   Title Patient will decrease fascial restrictions to moderate in her cervical region.   Time 4   Period Weeks           OT Long Term Goals - 05/16/15 1423    OT LONG TERM GOAL #1   Title Patient will be able to complete daily activities and sleep through the night.   Time 8   Period Weeks   Status On-going   OT LONG TERM GOAL #2   Title Patient will improve cervical AROM to [facility] for increased ability to look to right and left when driving.   Time 8   Period Weeks   Status On-going   OT LONG TERM GOAL  #3   Title Patient will decrease pain to 2/10 in her cervical region when completing work activiites.    Time 8   Period Weeks   Status On-going   OT LONG TERM GOAL #4   Title Patient will decrease fascial restrictions to minimal in her cervical region.   Time 8   Period Weeks   Status On-going   OT LONG TERM GOAL #5   Title Patient will improve postural stabiity and scapular alignment by 70%.   Time 8   Period Weeks   Status On-going               Plan - 06/08/15 1419    Clinical Impression Statement A:  Less restrictions in left shoulder and cervical region this date.     Plan P:  Reassess, functional reaching with left arm        Problem List Patient Active Problem List   Diagnosis Date Noted  . HIP PAIN 01/12/2009  . SPINAL STENOSIS 01/12/2009  . LOW BACK PAIN 01/12/2009  . SPONDYLOLYSIS 01/12/2009  . SHOULDER PAIN 10/04/2008  . SPONDYLOSIS 10/04/2008  . DIABETES 10/01/2008  . HIGH BLOOD PRESSURE 10/01/2008    Vangie Bicker, OTR/L [phone]  06/08/2015, 2:21 PM  [place] [address] [place], [place], [address] Phone: [phone]   Fax:  [phone]

## 2015-06-09 ENCOUNTER — Encounter: Payer: Medicare Other | Attending: "Endocrinology | Admitting: Nutrition

## 2015-06-09 ENCOUNTER — Encounter: Payer: Self-pay | Admitting: Nutrition

## 2015-06-09 VITALS — Ht 70.0 in | Wt 212.8 lb

## 2015-06-09 DIAGNOSIS — E118 Type 2 diabetes mellitus with unspecified complications: Secondary | ICD-10-CM | POA: Diagnosis not present

## 2015-06-09 DIAGNOSIS — Z713 Dietary counseling and surveillance: Secondary | ICD-10-CM | POA: Diagnosis not present

## 2015-06-09 DIAGNOSIS — E669 Obesity, unspecified: Secondary | ICD-10-CM

## 2015-06-09 DIAGNOSIS — Z6830 Body mass index (BMI) 30.0-30.9, adult: Secondary | ICD-10-CM | POA: Diagnosis not present

## 2015-06-09 DIAGNOSIS — IMO0002 Reserved for concepts with insufficient information to code with codable children: Secondary | ICD-10-CM

## 2015-06-09 DIAGNOSIS — E1165 Type 2 diabetes mellitus with hyperglycemia: Secondary | ICD-10-CM

## 2015-06-09 NOTE — Progress Notes (Signed)
  Medical Nutrition Therapy:  Appt start time: 2130 end time:  1630.  Assessment:  Primary concerns today: Diabetes. Type 2. LIves with her husband. She does the cooking and shopping. Most foods are baked, broiled and fried. Most recent A1C was 9%. 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 Admits to eating snacks. Gets exercise walking steps up and own from her house to funeral home but not much else exercise. No family history of Dm. Only had DM 2 yrs. Currently on Metformin 1000 mg BID and Januvia 100 mg. She wants to control her DM more with diet and exercise and weight loss and not more medications. Willing to change eating habits and start walking some to help lose weight and improve insulin resistance. Hasn't been testing her blood sugars because she didn't think she needed to. Willing to start testing a few times per week to assess changes with diet and impact on blood sugars. Sees Dr. Dorris Fetch for her DM. She needs increased fresh fruits and vegetables, higher fiber and more water intake and cut out diet sodas. Needs to avoid skipping meals, eat meals on time and monitor portions.   Preferred Learning Style: Auditory   No preference indicated   Learning Readiness:   Ready  Change in progress  MEDICATIONS: See list   DIETARY INTAKE:  24-hr recall:  B ( AM):  Skipped Snk ( AM):   L ( PM): BLT on white bread,  Diet Dr. Malachi Bonds, Snk ( PM):  D ( PM): [employer]; Fried chicken, seafood salad,, water Snk ( PM): Occasionally popcorn Beverages: water and diet sodas.  Usual physical activity: walking up and down steps at funeral home.  Estimated energy needs: 1600 calories 180 g carbohydrates 120 g protein 44 g fat  Progress Towards Goal(s):  In progress.   Nutritional Diagnosis:  NB-1.1 Food and nutrition-related knowledge deficit As related to Diabetes.  As evidenced by A1C 9%..    Intervention:  Nutrition counseling and diabetes education on diabetes disease, target ranges  for blood sugars, My Plate, meal planning, portion sizes, testing and using results. Low fat low sodium high fiber diet, foot care and importance of timing of meals and eating three meals per day, avoiding snacks and prevention of complications..  Goals:  1. Follow Plate Method. 2 Eat three meals on time 3. Avoid skipping meals. 4. Test blood sugars a few times per week  Goal: in am and before meals: 80-130 mg/dl and 2 hours after meal : Less than 180 and Bedtime: less than 150 mg/dl.  Exercise 15-30 minutes three days per week. Lose 2-3 lbs per month. Get A1C down to 7.5% in three months.  Teaching Method Utilized:  Visual Auditory Hands on  Handouts given during visit include:  The Plate Method  Meal Plan Card  Diabetes Instructions  Barriers to learning/adherence to lifestyle change: None  Demonstrated degree of understanding via:  Teach Back   Monitoring/Evaluation:  Dietary intake, exercise, meal planning, SBG, and body weight in 1 month(s).

## 2015-06-10 ENCOUNTER — Encounter (HOSPITAL_COMMUNITY): Payer: PRIVATE HEALTH INSURANCE | Admitting: Occupational Therapy

## 2015-06-10 NOTE — Patient Instructions (Addendum)
Goals:  1. Follow Plate Method. 2 Eat three meals on time 3. Avoid skipping meals. 4. Test blood sugars a few times per week  Goal: in am and before meals: 80-130 mg/dl and 2 hours after meal : Less than 180 and Bedtime: less than 150 mg/dl.  Exercise 15-30 minutes three days per week. Lose 2-3 lbs per month. Get A1C down to 7.5% in three months.

## 2015-06-13 ENCOUNTER — Encounter (HOSPITAL_COMMUNITY): Payer: PRIVATE HEALTH INSURANCE | Admitting: Specialist

## 2015-06-14 ENCOUNTER — Encounter (HOSPITAL_COMMUNITY): Payer: Self-pay | Admitting: Occupational Therapy

## 2015-06-14 ENCOUNTER — Ambulatory Visit (HOSPITAL_COMMUNITY): Payer: Medicare Other | Admitting: Occupational Therapy

## 2015-06-14 DIAGNOSIS — M542 Cervicalgia: Secondary | ICD-10-CM | POA: Diagnosis not present

## 2015-06-14 DIAGNOSIS — M6289 Other specified disorders of muscle: Secondary | ICD-10-CM

## 2015-06-14 DIAGNOSIS — M629 Disorder of muscle, unspecified: Secondary | ICD-10-CM

## 2015-06-14 DIAGNOSIS — R29898 Other symptoms and signs involving the musculoskeletal system: Secondary | ICD-10-CM

## 2015-06-14 DIAGNOSIS — E119 Type 2 diabetes mellitus without complications: Secondary | ICD-10-CM | POA: Diagnosis not present

## 2015-06-14 DIAGNOSIS — I10 Essential (primary) hypertension: Secondary | ICD-10-CM | POA: Diagnosis not present

## 2015-06-14 NOTE — Therapy (Addendum)
[place], [place], [address] Phone: [phone]   Fax:  [phone]  Occupational Therapy Reassessment & Discharge Summary  Patient Details  Name: [name] MRN: [id number] Date of Birth: Mar 19, 1929 Referring Provider:  Sharilyn Sites, MD  Encounter Date: 06/14/2015      OT End of Session - 06/14/15 1625    Visit Number 13   Number of Visits 16   Date for OT Re-Evaluation 06/17/15   Authorization Type medicare    Authorization - Visit Number 13   Authorization - Number of Visits 19   OT Start Time 1430   OT Stop Time 1515   OT Time Calculation (min) 45 min   Activity Tolerance Patient tolerated treatment well   Behavior During Therapy [facility] for tasks assessed/performed      Past Medical History  Diagnosis Date  . Hypertension   . Diabetes mellitus without complication     Past Surgical History  Procedure Laterality Date  . Abdominal hysterectomy    . Appendectomy    . Breast surgery      begnin tumor removed    There were no vitals filed for this visit.  Visit Diagnosis:  Tight fascia  Shoulder weakness      Subjective Assessment - 06/14/15 1431    Subjective  S:    Currently in Pain? Yes   Pain Score 2    Pain Location Shoulder   Pain Orientation Right;Left   Pain Descriptors / Indicators Aching   Pain Type Acute pain            [facility] OT Assessment - 06/14/15 1430    Assessment   Diagnosis Cervicalgia   Precautions   Precautions None   ROM / Strength   AROM / PROM / Strength AROM;Strength   Palpation   Palpation comment Min-mod fascial restrictions in cervical, SCM, scapular, trapezius regions   AROM   AROM Assessment Site Cervical   Cervical Flexion 4.5cm  6cm previous   Cervical Extension 7cm  same as previous   Cervical - Right Side Bend 9.5cm  10cm previous   Cervical - Left Side Bend 12cm  same as previous   Cervical - Right Rotation 11cm  14cm  previous   Cervical - Left Rotation 14cm  15cm previous   Strength   Strength Assessment Site Shoulder   Right/Left Shoulder Left;Right   Right Shoulder Flexion 4+/5   Right Shoulder ABduction 4+/5   Right Shoulder Internal Rotation 4+/5   Right Shoulder External Rotation 4+/5   Left Shoulder Flexion 4+/5   Left Shoulder ABduction 4+/5   Left Shoulder Internal Rotation 4+/5   Left Shoulder External Rotation 4+/5                  OT Treatments/Exercises (OP) - 06/14/15 1624    Exercises   Exercises Shoulder   Manual Therapy   Manual Therapy Myofascial release   Myofascial Release Myofascial release and manual stretching to bilateral scapular, trapezius, SCM, cervical region with manual cervical traction to decrease pain and restrictions and improve pain free mobility.                 OT Education - 06/14/15 1625    Education provided Yes   Education Details BUE AROM exercises.    Person(s) Educated Patient   Methods Explanation;Demonstration;Handout   Comprehension Verbalized understanding;Returned demonstration          OT Short Term Goals -  06-26-2015 1456    OT SHORT TERM GOAL #1   Title Patient will be educated on a HEP.   Time 4   Period Weeks   OT SHORT TERM GOAL #2   Title Patient will increase strength in her shoulders to 4+/5 for increased abilty to lift pots and pans.    Time 4   Period Weeks   Status Achieved   OT SHORT TERM GOAL #3   Title Patient will increase postural alignment by 25% for increased shoulder alignment and decreased pain when working.   Time 4   Period Weeks   Status Not Met   OT SHORT TERM GOAL #4   Title Patient will decrease pain to 4/10 in her neck region while working on the computer.   Time 4   Period Weeks   Status Achieved   OT SHORT TERM GOAL #5   Title Patient will decrease fascial restrictions to moderate in her cervical region.   Time 4   Period Weeks           OT Long Term Goals - 2015-06-26 1457     OT LONG TERM GOAL #1   Title Patient will be able to complete daily activities and sleep through the night.   Time 8   Period Weeks   Status Partially Met   OT LONG TERM GOAL #2   Title Patient will improve cervical AROM to [facility] for increased ability to look to right and left when driving.   Time 8   Period Weeks   Status Achieved   OT LONG TERM GOAL #3   Title Patient will decrease pain to 2/10 in her cervical region when completing work activiites.    Time 8   Period Weeks   Status Achieved   OT LONG TERM GOAL #4   Title Patient will decrease fascial restrictions to minimal in her cervical region.   Time 8   Period Weeks   Status Not Met   OT LONG TERM GOAL #5   Title Patient will improve postural stabiity and scapular alignment by 70%.   Time 8   Period Weeks   Status Partially Met               Plan - 06-26-15 1626    Clinical Impression Statement A: Reassessment completed this date. Pt has met 4/5 STGs, 2/5 LTGs, and has partially met 2/5 LTGs. Pt is pleased with progress and reports she is now able to complete computer work and daily tasks without pain; only having minimal pain at the end of long days. Pt is able to help pack up her house with minimal discomfort. Provided pt with and educated pt on BUE AROM HEP. Pt is agreeable with discharge.    Plan P: D/C patient          G-Codes - June 26, 2015 1629    Functional Assessment Tool Used FOTO Score: 70/100 (30% impaired)   Functional Limitation Self care   Self Care Goal Status ([id number]) At least 20 percent but less than 40 percent impaired, limited or restricted   Self Care Discharge Status [phone]) At least 20 percent but less than 40 percent impaired, limited or restricted      Problem List Patient Active Problem List   Diagnosis Date Noted  . HIP PAIN 01/12/2009  . SPINAL STENOSIS 01/12/2009  . LOW BACK PAIN 01/12/2009  . SPONDYLOLYSIS 01/12/2009  . SHOULDER PAIN 10/04/2008  . SPONDYLOSIS 10/04/2008  .  DIABETES 10/01/2008  .  HIGH BLOOD PRESSURE 10/01/2008    Guadelupe [name], OTR/L  [phone]  06/14/2015, 4:43 PM  [place] [address] [place], [place], [address] Phone: [phone]   Fax:  [phone]  OCCUPATIONAL THERAPY DISCHARGE SUMMARY  Visits from Start of Care: 13  Current functional level related to goals / functional outcomes: See goals above. Pt reports she is able to complete daily and work activities with little to no pain. Pt is actively involved in packing her house and moving and is able to sort through items and pack boxes with little to no discomfort.    Remaining deficits: Pt continues to demonstrate poor postural and scapular alignment. Pt has min-mod fascial restrictions remaining in cervical, trapezius, and scapularis regions.    Education / Equipment: Provided pt with and educated pt on BUE AROM exercises. Educated pt on continuing to complete cervical AROM & stretching HEPs previously provided. Reviewed exercises with pt.  Plan: Patient agrees to discharge.  Patient goals were partially met. Patient is being discharged due to being pleased with the current functional level.  ?????

## 2015-06-14 NOTE — Patient Instructions (Signed)
1) Shoulder Protraction    Begin with elbows by your side, slowly "punch" straight out in front of you keeping arms/elbows straight. Repeat __10_times, __2__sets/day.     2) Shoulder Flexion  Supine:     Standing:         Begin with arms at your side with thumbs pointed up, slowly raise both arms up and forward towards overhead. Repeat_10__times, _2__set/day.    3) Horizontal abduction/adduction  Supine:   Standing:           Begin with arms straight out in front of you, bring out to the side in at "T" shape. Keep arms straight entire time. Repeat __10__times, __2__sets/day.    4) Internal & External Rotation    *No band* -Stand with elbows at the side and elbows bent 90 degrees. Move your forearms away from your body, then bring back inward toward the body.  Repeat _10__times, _2__sets/day    5) Shoulder Abduction  Supine:     Standing:       Lying on your back begin with your arms flat on the table next to your side. Slowly move your arms out to the side so that they go overhead, in a jumping jack or snow angel movement. Repeat _10__times, _2__sets/day

## 2015-06-30 DIAGNOSIS — E1165 Type 2 diabetes mellitus with hyperglycemia: Secondary | ICD-10-CM | POA: Diagnosis not present

## 2015-06-30 DIAGNOSIS — I10 Essential (primary) hypertension: Secondary | ICD-10-CM | POA: Diagnosis not present

## 2015-06-30 DIAGNOSIS — E6609 Other obesity due to excess calories: Secondary | ICD-10-CM | POA: Diagnosis not present

## 2015-06-30 DIAGNOSIS — Z6830 Body mass index (BMI) 30.0-30.9, adult: Secondary | ICD-10-CM | POA: Diagnosis not present

## 2015-06-30 DIAGNOSIS — E782 Mixed hyperlipidemia: Secondary | ICD-10-CM | POA: Diagnosis not present

## 2015-07-08 DIAGNOSIS — E559 Vitamin D deficiency, unspecified: Secondary | ICD-10-CM | POA: Diagnosis not present

## 2015-07-08 DIAGNOSIS — I10 Essential (primary) hypertension: Secondary | ICD-10-CM | POA: Diagnosis not present

## 2015-07-08 DIAGNOSIS — E1165 Type 2 diabetes mellitus with hyperglycemia: Secondary | ICD-10-CM | POA: Diagnosis not present

## 2015-07-08 DIAGNOSIS — E669 Obesity, unspecified: Secondary | ICD-10-CM | POA: Diagnosis not present

## 2015-07-19 DIAGNOSIS — N3946 Mixed incontinence: Secondary | ICD-10-CM | POA: Diagnosis not present

## 2015-08-01 DIAGNOSIS — M4313 Spondylolisthesis, cervicothoracic region: Secondary | ICD-10-CM | POA: Diagnosis not present

## 2015-08-01 DIAGNOSIS — Z6830 Body mass index (BMI) 30.0-30.9, adult: Secondary | ICD-10-CM | POA: Diagnosis not present

## 2015-08-01 DIAGNOSIS — M542 Cervicalgia: Secondary | ICD-10-CM | POA: Diagnosis not present

## 2015-08-01 DIAGNOSIS — M5022 Other cervical disc displacement, mid-cervical region: Secondary | ICD-10-CM | POA: Diagnosis not present

## 2015-08-01 DIAGNOSIS — M47812 Spondylosis without myelopathy or radiculopathy, cervical region: Secondary | ICD-10-CM | POA: Diagnosis not present

## 2015-08-01 DIAGNOSIS — M5412 Radiculopathy, cervical region: Secondary | ICD-10-CM | POA: Diagnosis not present

## 2015-08-04 ENCOUNTER — Ambulatory Visit: Payer: PRIVATE HEALTH INSURANCE | Admitting: Nutrition

## 2015-08-05 DIAGNOSIS — E1129 Type 2 diabetes mellitus with other diabetic kidney complication: Secondary | ICD-10-CM | POA: Diagnosis not present

## 2015-09-01 ENCOUNTER — Emergency Department (HOSPITAL_COMMUNITY)
Admission: EM | Admit: 2015-09-01 | Discharge: 2015-09-01 | Disposition: A | Discharge status: Home / Self Care | Payer: Medicare Other | Attending: Emergency Medicine | Admitting: Emergency Medicine

## 2015-09-01 ENCOUNTER — Emergency Department (HOSPITAL_COMMUNITY): Payer: Medicare Other

## 2015-09-01 ENCOUNTER — Encounter (HOSPITAL_COMMUNITY): Payer: Self-pay | Admitting: Emergency Medicine

## 2015-09-01 DIAGNOSIS — Y998 Other external cause status: Secondary | ICD-10-CM | POA: Diagnosis not present

## 2015-09-01 DIAGNOSIS — Y9389 Activity, other specified: Secondary | ICD-10-CM | POA: Insufficient documentation

## 2015-09-01 DIAGNOSIS — Z79899 Other long term (current) drug therapy: Secondary | ICD-10-CM | POA: Diagnosis not present

## 2015-09-01 DIAGNOSIS — Y929 Unspecified place or not applicable: Secondary | ICD-10-CM | POA: Diagnosis not present

## 2015-09-01 DIAGNOSIS — S99911A Unspecified injury of right ankle, initial encounter: Secondary | ICD-10-CM | POA: Diagnosis not present

## 2015-09-01 DIAGNOSIS — I10 Essential (primary) hypertension: Secondary | ICD-10-CM | POA: Insufficient documentation

## 2015-09-01 DIAGNOSIS — S8391XA Sprain of unspecified site of right knee, initial encounter: Secondary | ICD-10-CM

## 2015-09-01 DIAGNOSIS — W19XXXA Unspecified fall, initial encounter: Secondary | ICD-10-CM | POA: Insufficient documentation

## 2015-09-01 DIAGNOSIS — S8991XA Unspecified injury of right lower leg, initial encounter: Secondary | ICD-10-CM | POA: Diagnosis not present

## 2015-09-01 DIAGNOSIS — E119 Type 2 diabetes mellitus without complications: Secondary | ICD-10-CM | POA: Insufficient documentation

## 2015-09-01 MED ORDER — TRAMADOL HCL 50 MG PO TABS
50.0000 mg | ORAL_TABLET | Freq: Once | ORAL | Status: AC
  Administered 2015-09-01: 50 mg via ORAL
  Filled 2015-09-01: qty 1

## 2015-09-01 NOTE — ED Notes (Signed)
Patient reports fell on Monday. Complaining of pain to right knee.

## 2015-09-01 NOTE — Discharge Instructions (Signed)

## 2015-09-01 NOTE — ED Provider Notes (Signed)
CSN: [id number]     Arrival date & time 09/01/15  1931 History   First MD Initiated Contact with Patient 09/01/15 1945     Chief Complaint  Patient presents with  . Fall  . Knee Pain     (Consider location/radiation/quality/duration/timing/severity/associated sxs/prior Treatment) HPI Comments: The patient is an 81 year old female who presents with right knee and ankle pain after having acute onset of a fall which occurred 4 days ago. She is having ongoing pain to the right knee. She is having difficulty ambulating because this makes the pain worse. She complains of mild swelling but no pain at rest, in the sitting position, in the supine position. There is no fevers, no redness, mild swelling of the knee and ankle. She denies head injury neck pain back pain numbness or weakness. She has been taking tramadol with some relief.  Patient is a 81 y.o. female presenting with fall and knee pain. The history is provided by the patient.  Fall  Knee Pain   Past Medical History  Diagnosis Date  . Hypertension   . Diabetes mellitus without complication    Past Surgical History  Procedure Laterality Date  . Abdominal hysterectomy    . Appendectomy    . Breast surgery      begnin tumor removed   History reviewed. No pertinent family history. Social History  Substance Use Topics  . Smoking status: Never Smoker   . Smokeless tobacco: None  . Alcohol Use: No   OB History    No data available     Review of Systems  All other systems reviewed and are negative.     Allergies  Codeine  Home Medications   Prior to Admission medications   Medication Sig Start Date End Date Taking? Authorizing Provider  lisinopril (PRINIVIL,ZESTRIL) 20 MG tablet Take 20 mg by mouth 2 (two) times daily.   Yes Historical Provider, MD  metFORMIN (GLUCOPHAGE) 1000 MG tablet Take 1,000 mg by mouth 2 (two) times daily. 09/21/14  Yes Historical Provider, MD  sitaGLIPtin (JANUVIA) 100 MG tablet Take 100 mg by  mouth daily.   Yes Historical Provider, MD  zolpidem (AMBIEN) 10 MG tablet Take 10 mg by mouth at bedtime as needed for sleep.   Yes Historical Provider, MD  ALPRAZolam Duanne Moron) 1 MG tablet Take 1 mg by mouth daily.     Historical Provider, MD  furosemide (LASIX) 20 MG tablet Take 20 mg by mouth daily as needed (for fluid retention).     Historical Provider, MD  gabapentin (NEURONTIN) 600 MG tablet Take 600 mg by mouth 3 (three) times daily.    Historical Provider, MD  glipiZIDE (GLUCOTROL) 5 MG tablet Take 5 mg by mouth daily.    Historical Provider, MD  lisinopril-hydrochlorothiazide (PRINZIDE,ZESTORETIC) 20-12.5 MG per tablet Take 1 tablet by mouth 2 (two) times daily.    Historical Provider, MD  metoprolol succinate (TOPROL-XL) 50 MG 24 hr tablet Take 50 mg by mouth daily. Take with or immediately following a meal.    Historical Provider, MD  MYRBETRIQ 50 MG TB24 tablet Take 50 mg by mouth daily. 09/02/14   Historical Provider, MD  nystatin-triamcinolone (MYCOLOG II) cream Apply 1 application topically 2 (two) times daily.    Historical Provider, MD  ondansetron (ZOFRAN) 4 MG tablet Take 1 tablet (4 mg total) by mouth every 8 (eight) hours as needed for nausea or vomiting. Patient not taking: Reported on 04/18/2015 10/13/14   Noemi Chapel, MD  simvastatin (ZOCOR) 20 MG  tablet Take 20 mg by mouth every evening.    Historical Provider, MD  traMADol (ULTRAM) 50 MG tablet Take 50 mg by mouth 4 (four) times daily as needed for pain.    Historical Provider, MD  traMADol (ULTRAM) 50 MG tablet Take 1 tablet (50 mg total) by mouth every 6 (six) hours as needed. 10/13/14   Noemi Chapel, MD   BP 138/43 mmHg  Pulse 61  Temp(Src) 98.3 F (36.8 C) (Oral)  Resp 18  SpO2 92% Physical Exam  Constitutional: She appears well-developed and well-nourished. No distress.  HENT:  Head: Normocephalic and atraumatic.  Mouth/Throat: Oropharynx is clear and moist. No oropharyngeal exudate.  Eyes: Conjunctivae and  EOM are normal. Pupils are equal, round, and reactive to light. Right eye exhibits no discharge. Left eye exhibits no discharge. No scleral icterus.  Cardiovascular: Normal rate, regular rhythm, normal heart sounds and intact distal pulses.   Pulmonary/Chest: Effort normal.  Musculoskeletal: She exhibits edema and tenderness.  Mild swelling of the right knee, 1+ pitting edema bilaterally below the knees. Normal range of motion of the knee and the ankle on the right, mild tenderness with range of motion of the ankle. No bony tenderness to the knee or the ankle.  Neurological: She is alert. Coordination normal.  Normal strength / sensation  Skin: Skin is warm and dry. No rash noted. No erythema.  Psychiatric: She has a normal mood and affect. Her behavior is normal.  Nursing note and vitals reviewed.   ED Course  Procedures (including critical care time) Labs Review Labs Reviewed - No data to display  Imaging Review Dg Ankle Complete Right  09/01/2015   CLINICAL DATA:  80 year old female with fall  EXAM: RIGHT ANKLE - COMPLETE 3+ VIEW  COMPARISON:  None.  FINDINGS: There is no acute fracture or dislocation. The ankle mortise is intact. There is irregularity of the distal fibula likely related to an old healed fracture. There is soft tissue swelling over the lateral malleolus.  IMPRESSION: No acute fracture or dislocation.   Electronically Signed   By: Anner Crete M.D.   On: 09/01/2015 20:29   Dg Knee Complete 4 Views Right  09/01/2015   CLINICAL DATA:  81 year old female with trauma and fall.  EXAM: RIGHT KNEE - COMPLETE 4+ VIEW  COMPARISON:  None.  FINDINGS: No acute fracture or dislocation. There is mild narrowing of the knee joint space. There are degenerative changes with bone spur. There is meniscal chondrocalcinosis. No significant suprapatellar effusion noted. The soft tissues are unremarkable.  IMPRESSION: No acute fracture or dislocation.   Electronically Signed   By: Anner Crete  M.D.   On: 09/01/2015 20:28   I have personally reviewed and evaluated these images and lab results as part of my medical decision-making.    MDM   Final diagnoses:  Sprain of right knee, initial encounter    The patient has tenderness in the right knee and the right ankle, she has signs and symptoms consistent with strains or sprains but unlikely to be fractures, rule out with x-ray, pain medications ordered. Anticipate outpatient orthopedic follow-up. May need physical therapy, [facility]  xrays neg - stable fo rd/c with knee immob and crutchest with ortho f/u - no frx.  Has tramadol at home  Meds given in ED:  Medications  traMADol (ULTRAM) tablet 50 mg (50 mg Oral Given 09/01/15 2001)    New Prescriptions   No medications on file      Noemi Chapel,  MD 09/01/15 2041

## 2015-09-01 NOTE — ED Notes (Signed)
Pt declined crutches.

## 2015-09-16 DIAGNOSIS — Z6830 Body mass index (BMI) 30.0-30.9, adult: Secondary | ICD-10-CM | POA: Diagnosis not present

## 2015-09-16 DIAGNOSIS — E6609 Other obesity due to excess calories: Secondary | ICD-10-CM | POA: Diagnosis not present

## 2015-09-16 DIAGNOSIS — S83411D Sprain of medial collateral ligament of right knee, subsequent encounter: Secondary | ICD-10-CM | POA: Diagnosis not present

## 2015-09-16 DIAGNOSIS — M1711 Unilateral primary osteoarthritis, right knee: Secondary | ICD-10-CM | POA: Diagnosis not present

## 2015-09-16 DIAGNOSIS — Z23 Encounter for immunization: Secondary | ICD-10-CM | POA: Diagnosis not present

## 2015-09-16 DIAGNOSIS — Z1389 Encounter for screening for other disorder: Secondary | ICD-10-CM | POA: Diagnosis not present

## 2015-09-22 ENCOUNTER — Other Ambulatory Visit: Payer: Self-pay | Admitting: "Endocrinology

## 2015-09-24 ENCOUNTER — Other Ambulatory Visit: Payer: Self-pay | Admitting: "Endocrinology

## 2015-09-27 ENCOUNTER — Other Ambulatory Visit: Payer: Self-pay | Admitting: "Endocrinology

## 2015-09-27 DIAGNOSIS — B351 Tinea unguium: Secondary | ICD-10-CM | POA: Diagnosis not present

## 2015-09-27 DIAGNOSIS — E1142 Type 2 diabetes mellitus with diabetic polyneuropathy: Secondary | ICD-10-CM | POA: Diagnosis not present

## 2015-09-27 DIAGNOSIS — E782 Mixed hyperlipidemia: Secondary | ICD-10-CM | POA: Diagnosis not present

## 2015-09-27 DIAGNOSIS — I10 Essential (primary) hypertension: Secondary | ICD-10-CM | POA: Diagnosis not present

## 2015-09-27 DIAGNOSIS — E1165 Type 2 diabetes mellitus with hyperglycemia: Secondary | ICD-10-CM | POA: Diagnosis not present

## 2015-09-28 LAB — COMPREHENSIVE METABOLIC PANEL
ALT: 11 IU/L (ref 0–32)
AST: 11 IU/L (ref 0–40)
Albumin/Globulin Ratio: 1.7 (ref 1.1–2.5)
Albumin: 3.9 g/dL (ref 3.5–4.7)
Alkaline Phosphatase: 67 IU/L (ref 39–117)
BUN/Creatinine Ratio: 19 (ref 11–26)
BUN: 19 mg/dL (ref 8–27)
Bilirubin Total: 0.5 mg/dL (ref 0.0–1.2)
CO2: 27 mmol/L (ref 18–29)
Calcium: 9.3 mg/dL (ref 8.7–10.3)
Chloride: 97 mmol/L (ref 97–108)
Creatinine, Ser: 0.99 mg/dL (ref 0.57–1.00)
GFR calc Af Amer: 61 mL/min/{1.73_m2} (ref >59)
GFR calc non Af Amer: 53 mL/min/{1.73_m2} — ABNORMAL LOW (ref >59)
Globulin, Total: 2.3 g/dL (ref 1.5–4.5)
Glucose: 136 mg/dL — ABNORMAL HIGH (ref 65–99)
Potassium: 4.4 mmol/L (ref 3.5–5.2)
Sodium: 139 mmol/L (ref 134–144)
Total Protein: 6.2 g/dL (ref 6.0–8.5)

## 2015-09-28 LAB — HGB A1C W/O EAG: Hgb A1c MFr Bld: 7.4 % — ABNORMAL HIGH (ref 4.8–5.6)

## 2015-10-04 DIAGNOSIS — S8391XD Sprain of unspecified site of right knee, subsequent encounter: Secondary | ICD-10-CM | POA: Diagnosis not present

## 2015-10-04 DIAGNOSIS — M1711 Unilateral primary osteoarthritis, right knee: Secondary | ICD-10-CM | POA: Diagnosis not present

## 2015-10-10 ENCOUNTER — Encounter: Payer: Self-pay | Admitting: "Endocrinology

## 2015-10-10 ENCOUNTER — Ambulatory Visit (INDEPENDENT_AMBULATORY_CARE_PROVIDER_SITE_OTHER): Payer: Medicare Other | Admitting: "Endocrinology

## 2015-10-10 VITALS — BP 139/80 | HR 57 | Ht 69.0 in | Wt 202.0 lb

## 2015-10-10 DIAGNOSIS — E1165 Type 2 diabetes mellitus with hyperglycemia: Secondary | ICD-10-CM | POA: Diagnosis not present

## 2015-10-10 DIAGNOSIS — E559 Vitamin D deficiency, unspecified: Secondary | ICD-10-CM | POA: Diagnosis not present

## 2015-10-10 DIAGNOSIS — I10 Essential (primary) hypertension: Secondary | ICD-10-CM | POA: Diagnosis not present

## 2015-10-10 DIAGNOSIS — E6609 Other obesity due to excess calories: Secondary | ICD-10-CM | POA: Diagnosis not present

## 2015-10-10 DIAGNOSIS — Z6831 Body mass index (BMI) 31.0-31.9, adult: Secondary | ICD-10-CM

## 2015-10-10 DIAGNOSIS — IMO0001 Reserved for inherently not codable concepts without codable children: Secondary | ICD-10-CM

## 2015-10-10 MED ORDER — METFORMIN HCL 1000 MG PO TABS: 1000.0000 mg | ORAL_TABLET | Freq: Two times a day (BID) | ORAL | Status: DC

## 2015-10-10 MED ORDER — GABAPENTIN 300 MG PO CAPS: 300.0000 mg | ORAL_CAPSULE | Freq: Three times a day (TID) | ORAL | Status: DC

## 2015-10-10 MED ORDER — SITAGLIPTIN PHOSPHATE 100 MG PO TABS: 100.0000 mg | ORAL_TABLET | Freq: Every day | ORAL | Status: DC

## 2015-10-10 NOTE — Progress Notes (Signed)
Subjective:    Patient ID: [name], female    DOB: [birth year],    Past Medical History  Diagnosis Date  . Hypertension   . Diabetes mellitus without complication [facility])    Past Surgical History  Procedure Laterality Date  . Abdominal hysterectomy    . Appendectomy    . Breast surgery      begnin tumor removed   Social History   Social History  . Marital Status: Married    Spouse Name: N/A  . Number of Children: N/A  . Years of Education: N/A   Social History Main Topics  . Smoking status: Never Smoker   . Smokeless tobacco: None  . Alcohol Use: No  . Drug Use: No  . Sexual Activity: Not Asked   Other Topics Concern  . None   Social History Narrative   Outpatient Encounter Prescriptions as of 10/10/2015  Medication Sig  . ALPRAZolam (XANAX) 1 MG tablet Take 1 mg by mouth daily.   . furosemide (LASIX) 20 MG tablet Take 20 mg by mouth daily as needed (for fluid retention).   . gabapentin (NEURONTIN) 300 MG capsule Take 1 capsule (300 mg total) by mouth 3 (three) times daily.  . Ibuprofen-Famotidine (DUEXIS) 800-26.6 MG TABS Take by mouth 2 (two) times daily.  [name] lisinopril (PRINIVIL,ZESTRIL) 20 MG tablet Take 20 mg by mouth 2 (two) times daily.  . metFORMIN (GLUCOPHAGE) 1000 MG tablet Take 1 tablet (1,000 mg total) by mouth 2 (two) times daily.  . metoprolol succinate (TOPROL-XL) 50 MG 24 hr tablet Take 50 mg by mouth daily. Take with or immediately following a meal.  . MYRBETRIQ 50 MG TB24 tablet Take 50 mg by mouth daily.  . sitaGLIPtin (JANUVIA) 100 MG tablet Take 1 tablet (100 mg total) by mouth daily.  [name] tiZANidine (ZANAFLEX) 2 MG tablet Take by mouth every 6 (six) hours as needed for muscle spasms.  . [DISCONTINUED] gabapentin (NEURONTIN) 300 MG capsule TAKE (1) CAPSULE BY MOUTH THREE TIMES DAILY AS NEEDED.  . [DISCONTINUED] metFORMIN (GLUCOPHAGE) 1000 MG tablet Take 1,000 mg by mouth 2 (two) times daily.  . [DISCONTINUED] sitaGLIPtin (JANUVIA)  100 MG tablet Take 100 mg by mouth daily.  [name] lisinopril-hydrochlorothiazide (PRINZIDE,ZESTORETIC) 20-12.5 MG per tablet Take 1 tablet by mouth 2 (two) times daily.  [name] nystatin-triamcinolone (MYCOLOG II) cream Apply 1 application topically 2 (two) times daily.  . ondansetron (ZOFRAN) 4 MG tablet Take 1 tablet (4 mg total) by mouth every 8 (eight) hours as needed for nausea or vomiting. (Patient not taking: Reported on 04/18/2015)  . simvastatin (ZOCOR) 20 MG tablet Take 20 mg by mouth every evening.  . traMADol (ULTRAM) 50 MG tablet Take 50 mg by mouth 4 (four) times daily as needed for pain.  . traMADol (ULTRAM) 50 MG tablet Take 1 tablet (50 mg total) by mouth every 6 (six) hours as needed.  . Vitamin D, Ergocalciferol, (DRISDOL) 50000 UNITS CAPS capsule Take 1 capsule by mouth  every week  . zolpidem (AMBIEN) 10 MG tablet Take 10 mg by mouth at bedtime as needed for sleep.  . [DISCONTINUED] glipiZIDE (GLUCOTROL) 5 MG tablet Take 5 mg by mouth daily.   No facility-administered encounter medications on file as of 10/10/2015.   ALLERGIES: Allergies  Allergen Reactions  . Codeine Nausea And Vomiting   VACCINATION STATUS:  There is no immunization history on file for this patient.  Diabetes She presents for her follow-up diabetic visit. She has type 2  diabetes mellitus. Onset time: She was diagnosed at approximate age of 77 years. Her disease course has been improving. There are no hypoglycemic associated symptoms. Pertinent negatives for hypoglycemia include no confusion, headaches, pallor or seizures. Associated symptoms include fatigue. Pertinent negatives for diabetes include no chest pain, no polydipsia, no polyphagia and no polyuria. There are no hypoglycemic complications. Symptoms are improving. There are no diabetic complications. Risk factors for coronary artery disease include dyslipidemia, diabetes mellitus and hypertension. Current diabetic treatment includes oral agent (dual  therapy). She is following a generally healthy diet. She has had a previous visit with a dietitian. An ACE inhibitor/angiotensin II receptor blocker is being taken.  Hyperlipidemia This is a chronic problem. The current episode started more than 1 year ago. Pertinent negatives include no chest pain, myalgias or shortness of breath. Current antihyperlipidemic treatment includes statins. Risk factors for coronary artery disease include diabetes mellitus, dyslipidemia and hypertension.  Hypertension This is a chronic problem. The current episode started more than 1 year ago. The problem is controlled. Pertinent negatives include no chest pain, headaches, palpitations or shortness of breath. Past treatments include ACE inhibitors. The current treatment provides moderate improvement.     Review of Systems  Constitutional: Positive for fatigue. Negative for unexpected weight change.  HENT: Negative for trouble swallowing and voice change.   Eyes: Negative for visual disturbance.  Respiratory: Negative for cough, shortness of breath and wheezing.   Cardiovascular: Negative for chest pain, palpitations and leg swelling.  Gastrointestinal: Negative for nausea, vomiting and diarrhea.  Endocrine: Negative for cold intolerance, heat intolerance, polydipsia, polyphagia and polyuria.  Musculoskeletal: Positive for gait problem. Negative for myalgias and arthralgias.  Skin: Negative for color change, pallor, rash and wound.  Neurological: Negative for seizures and headaches.  Psychiatric/Behavioral: Negative for suicidal ideas and confusion.    Objective:    BP 139/80 mmHg  Pulse 57  Ht 5\' 9"  (1.753 m)  Wt 202 lb (91.627 kg)  BMI 29.82 kg/m2  SpO2 96%  Wt Readings from Last 3 Encounters:  10/10/15 202 lb (91.627 kg)  06/09/15 212 lb 12.8 oz (96.525 kg)  03/18/15 200 lb (90.719 kg)    Physical Exam  Constitutional: She is oriented to person, place, and time. She appears well-developed.  HENT:   Head: Normocephalic and atraumatic.  Eyes: EOM are normal.  Neck: Normal range of motion. Neck supple. No tracheal deviation present. No thyromegaly present.  Cardiovascular: Normal rate and regular rhythm.   Pulmonary/Chest: Effort normal and breath sounds normal.  Abdominal: Soft. Bowel sounds are normal. There is no tenderness. There is no guarding.  Musculoskeletal: Normal range of motion. She exhibits no edema.  Neurological: She is alert and oriented to person, place, and time. She has normal reflexes. No cranial nerve deficit. Coordination normal.  Skin: Skin is warm and dry. No rash noted. No erythema. No pallor.  Psychiatric: She has a normal mood and affect. Judgment normal.    Results for orders placed or performed in visit on 09/27/15  Comprehensive metabolic panel  Result Value Ref Range   Glucose 136 (H) 65 - 99 mg/dL   BUN 19 8 - 27 mg/dL   Creatinine, Ser 0.99 0.57 - 1.00 mg/dL   GFR calc non Af Amer 53 (L) >59 mL/min/1.73   GFR calc Af Amer 61 >59 mL/min/1.73   BUN/Creatinine Ratio 19 11 - 26   Sodium 139 134 - 144 mmol/L   Potassium 4.4 3.5 - 5.2 mmol/L   Chloride 97  97 - 108 mmol/L   CO2 27 18 - 29 mmol/L   Calcium 9.3 8.7 - 10.3 mg/dL   Total Protein 6.2 6.0 - 8.5 g/dL   Albumin 3.9 3.5 - 4.7 g/dL   Globulin, Total 2.3 1.5 - 4.5 g/dL   Albumin/Globulin Ratio 1.7 1.1 - 2.5   Bilirubin Total 0.5 0.0 - 1.2 mg/dL   Alkaline Phosphatase 67 39 - 117 IU/L   AST 11 0 - 40 IU/L   ALT 11 0 - 32 IU/L  Hgb A1c w/o eAG  Result Value Ref Range   Hgb A1c MFr Bld 7.4 (H) 4.8 - 5.6 %   Complete Blood Count (Most recent): Lab Results  Component Value Date   WBC 13.3* 09/07/2009   HGB 11.2* 09/07/2009   HCT 33.3* 09/07/2009   MCV 92.6 09/07/2009   PLT 222 09/07/2009   Chemistry (most recent): Lab Results  Component Value Date   NA 139 09/27/2015   K 4.4 09/27/2015   CL 97 09/27/2015   CO2 27 09/27/2015   BUN 19 09/27/2015   CREATININE 0.99 09/27/2015    Diabetic Labs (most recent): Lab Results  Component Value Date   HGBA1C 7.4* 09/27/2015   Lipid profile (most recent): No results found for: TRIG, CHOL       Assessment & Plan:   1. Uncontrolled type 2 diabetes mellitus without complication, without long-term current use of insulin ([place])  Patient came with  A1c of 7.4 %.  Glucose logs and insulin administration records pertaining to this visit,  to be scanned into patient's records.  Recent labs reviewed. - Patient remains at a high risk for more acute and chronic complications of diabetes which include CAD, CVA, CKD, retinopathy, and neuropathy. These are all discussed in detail with the patient.  - I have re-counseled the patient on diet management and weight loss  by adopting a carbohydrate restricted / protein rich  Diet. - Patient is advised to stick to a routine mealtimes to eat 3 meals  a day and avoid unnecessary snacks ( to snack only to correct hypoglycemia).  - Suggestion is made for patient to avoid simple carbohydrates   from their diet including Cakes , Desserts, Ice Cream,  Soda (  diet and regular) , Sweet Tea , Candies,  Chips, Cookies, Artificial Sweeteners,   and "Sugar-free" Products .  This will help patient to have stable blood glucose profile and potentially avoid unintended  Weight gain.  - The patient  has been  scheduled with Jearld Fenton, RDN, CDE for individualized DM education. - I have approached patient with the following individualized plan to manage diabetes and patient agrees.  She will continue MTF 1gm po BID and Januvia 100mg  po qday for now. -Patient will be considered for incretin therapy if she looses control. -Patient is encouraged to call clinic for blood glucose levels less than 70 or above 300 mg /dl.   - Patient specific target  for A1c; LDL, HDL, Triglycerides, and  Waist Circumference were discussed in detail.  2) BP/HTN: Controlled. Continue current medications including CEI. 3)  Lipids/HPL:  continue statins. 4)  Weight/Diet: CDE consult in progress, exercise, and carbohydrates information provided. 5)  Vitamin D deficiency I would initiate vitamin D therapy. Start 50,000 units weekly for the next 12 weeks  5) Chronic Care/Health Maintenance:  -Patien is on ACEI and Statin medications and encouraged to continue to follow up with Ophthalmology, Podiatrist at least yearly or according to recommendations, and advised  to stay away from smoking. I have recommended yearly flu vaccine and pneumonia vaccination at least every 5 years; moderate intensity exercise for up to 150 minutes weekly; and  sleep for at least 7 hours a day.  I advised patient to maintain close follow up with their PCP for primary care needs.  Patient is asked to bring meter and  blood glucose logs during their next visit.   Follow up plan: Return in about 4 months (around 02/10/2016) for diabetes, high blood pressure, high cholesterol, follow up with pre-visit labs.  Glade Lloyd, MD Phone: [phone]  Fax: [phone]   10/10/2015, 10:50 AM

## 2015-10-10 NOTE — Patient Instructions (Signed)

## 2015-10-25 DIAGNOSIS — M1711 Unilateral primary osteoarthritis, right knee: Secondary | ICD-10-CM | POA: Diagnosis not present

## 2015-12-06 DIAGNOSIS — B351 Tinea unguium: Secondary | ICD-10-CM | POA: Diagnosis not present

## 2015-12-06 DIAGNOSIS — E1142 Type 2 diabetes mellitus with diabetic polyneuropathy: Secondary | ICD-10-CM | POA: Diagnosis not present

## 2016-01-30 DIAGNOSIS — Z1231 Encounter for screening mammogram for malignant neoplasm of breast: Secondary | ICD-10-CM | POA: Diagnosis not present

## 2016-02-02 DIAGNOSIS — D225 Melanocytic nevi of trunk: Secondary | ICD-10-CM | POA: Diagnosis not present

## 2016-02-02 DIAGNOSIS — D0439 Carcinoma in situ of skin of other parts of face: Secondary | ICD-10-CM | POA: Diagnosis not present

## 2016-02-13 ENCOUNTER — Other Ambulatory Visit: Payer: Self-pay | Admitting: "Endocrinology

## 2016-02-13 ENCOUNTER — Ambulatory Visit: Payer: Medicare Other | Admitting: "Endocrinology

## 2016-02-13 DIAGNOSIS — E1165 Type 2 diabetes mellitus with hyperglycemia: Secondary | ICD-10-CM | POA: Diagnosis not present

## 2016-02-14 LAB — BASIC METABOLIC PANEL
BUN/Creatinine Ratio: 30 — ABNORMAL HIGH (ref 11–26)
BUN: 25 mg/dL (ref 8–27)
CO2: 20 mmol/L (ref 18–29)
Calcium: 9.2 mg/dL (ref 8.7–10.3)
Chloride: 102 mmol/L (ref 96–106)
Creatinine, Ser: 0.83 mg/dL (ref 0.57–1.00)
GFR calc Af Amer: 75 mL/min/{1.73_m2} (ref >59)
GFR calc non Af Amer: 65 mL/min/{1.73_m2} (ref >59)
Glucose: 177 mg/dL — ABNORMAL HIGH (ref 65–99)
Potassium: 4.2 mmol/L (ref 3.5–5.2)
Sodium: 145 mmol/L — ABNORMAL HIGH (ref 134–144)

## 2016-02-14 LAB — HGB A1C W/O EAG: Hgb A1c MFr Bld: 7.3 % — ABNORMAL HIGH (ref 4.8–5.6)

## 2016-02-19 ENCOUNTER — Other Ambulatory Visit: Payer: Self-pay | Admitting: "Endocrinology

## 2016-02-20 ENCOUNTER — Encounter: Payer: Self-pay | Admitting: "Endocrinology

## 2016-02-20 ENCOUNTER — Ambulatory Visit (INDEPENDENT_AMBULATORY_CARE_PROVIDER_SITE_OTHER): Payer: Medicare Other | Admitting: "Endocrinology

## 2016-02-20 VITALS — BP 129/78 | HR 70 | Ht 69.0 in | Wt 197.0 lb

## 2016-02-20 DIAGNOSIS — IMO0002 Reserved for concepts with insufficient information to code with codable children: Secondary | ICD-10-CM

## 2016-02-20 DIAGNOSIS — E1165 Type 2 diabetes mellitus with hyperglycemia: Secondary | ICD-10-CM

## 2016-02-20 DIAGNOSIS — E785 Hyperlipidemia, unspecified: Secondary | ICD-10-CM

## 2016-02-20 DIAGNOSIS — E782 Mixed hyperlipidemia: Secondary | ICD-10-CM | POA: Insufficient documentation

## 2016-02-20 DIAGNOSIS — I10 Essential (primary) hypertension: Secondary | ICD-10-CM | POA: Diagnosis not present

## 2016-02-20 DIAGNOSIS — E118 Type 2 diabetes mellitus with unspecified complications: Secondary | ICD-10-CM | POA: Diagnosis not present

## 2016-02-20 MED ORDER — SITAGLIPTIN PHOSPHATE 50 MG PO TABS: 50.0000 mg | ORAL_TABLET | Freq: Every day | ORAL | Status: DC

## 2016-02-20 MED ORDER — VITAMIN D (ERGOCALCIFEROL) 1.25 MG (50000 UNIT) PO CAPS: ORAL_CAPSULE | ORAL | Status: DC

## 2016-02-20 NOTE — Progress Notes (Signed)
Subjective:    Patient ID: [name], female    DOB: 1929/06/18,    Past Medical History  Diagnosis Date  . Hypertension   . Diabetes mellitus without complication [facility])    Past Surgical History  Procedure Laterality Date  . Abdominal hysterectomy    . Appendectomy    . Breast surgery      begnin tumor removed   Social History   Social History  . Marital Status: Married    Spouse Name: N/A  . Number of Children: N/A  . Years of Education: N/A   Social History Main Topics  . Smoking status: Never Smoker   . Smokeless tobacco: None  . Alcohol Use: No  . Drug Use: No  . Sexual Activity: Not Asked   Other Topics Concern  . None   Social History Narrative   Outpatient Encounter Prescriptions as of 02/20/2016  Medication Sig  . ALPRAZolam (XANAX) 1 MG tablet Take 1 mg by mouth daily.   . furosemide (LASIX) 20 MG tablet Take 20 mg by mouth daily as needed (for fluid retention).   . gabapentin (NEURONTIN) 300 MG capsule Take 1 capsule (300 mg total) by mouth 3 (three) times daily.  . Ibuprofen-Famotidine (DUEXIS) 800-26.6 MG TABS Take by mouth 2 (two) times daily.  [name] lisinopril (PRINIVIL,ZESTRIL) 20 MG tablet Take 20 mg by mouth 2 (two) times daily.  . metFORMIN (GLUCOPHAGE) 1000 MG tablet Take 1 tablet (1,000 mg total) by mouth 2 (two) times daily.  . metoprolol succinate (TOPROL-XL) 50 MG 24 hr tablet Take 50 mg by mouth daily. Take with or immediately following a meal.  . MYRBETRIQ 50 MG TB24 tablet Take 50 mg by mouth daily.  [name] nystatin-triamcinolone (MYCOLOG II) cream Apply 1 application topically 2 (two) times daily.  . ondansetron (ZOFRAN) 4 MG tablet Take 1 tablet (4 mg total) by mouth every 8 (eight) hours as needed for nausea or vomiting.  . simvastatin (ZOCOR) 20 MG tablet Take 20 mg by mouth every evening.  . sitaGLIPtin (JANUVIA) 50 MG tablet Take 1 tablet (50 mg total) by mouth daily.  [name] tiZANidine (ZANAFLEX) 2 MG tablet Take by mouth every 6  (six) hours as needed for muscle spasms.  . traMADol (ULTRAM) 50 MG tablet Take 1 tablet (50 mg total) by mouth every 6 (six) hours as needed.  . zolpidem (AMBIEN) 10 MG tablet Take 10 mg by mouth at bedtime as needed for sleep.  . [DISCONTINUED] sitaGLIPtin (JANUVIA) 100 MG tablet Take 1 tablet (100 mg total) by mouth daily.  . [DISCONTINUED] Vitamin D, Ergocalciferol, (DRISDOL) 50000 UNITS CAPS capsule Take 1 capsule by mouth  every week  . [DISCONTINUED] Vitamin D, Ergocalciferol, (DRISDOL) 50000 units CAPS capsule Take 1 capsule by mouth  every week x 12 weeks  . [DISCONTINUED] lisinopril-hydrochlorothiazide (PRINZIDE,ZESTORETIC) 20-12.5 MG per tablet Take 1 tablet by mouth 2 (two) times daily.  . [DISCONTINUED] traMADol (ULTRAM) 50 MG tablet Take 50 mg by mouth 4 (four) times daily as needed for pain.   No facility-administered encounter medications on file as of 02/20/2016.   ALLERGIES: Allergies  Allergen Reactions  . Codeine Nausea And Vomiting   VACCINATION STATUS:  There is no immunization history on file for this patient.  Diabetes She presents for her follow-up diabetic visit. She has type 2 diabetes mellitus. Onset time: She was diagnosed at approximate age of 70 years. Her disease course has been improving. There are no hypoglycemic associated symptoms. Pertinent negatives  for hypoglycemia include no confusion, headaches, pallor or seizures. Associated symptoms include fatigue. Pertinent negatives for diabetes include no chest pain, no polydipsia, no polyphagia and no polyuria. There are no hypoglycemic complications. Symptoms are improving. There are no diabetic complications. Risk factors for coronary artery disease include dyslipidemia, diabetes mellitus and hypertension. Current diabetic treatment includes oral agent (dual therapy). She is following a generally healthy diet. She has had a previous visit with a dietitian. An ACE inhibitor/angiotensin II receptor blocker is being  taken.  Hyperlipidemia This is a chronic problem. The current episode started more than 1 year ago. Pertinent negatives include no chest pain, myalgias or shortness of breath. Current antihyperlipidemic treatment includes statins. Risk factors for coronary artery disease include diabetes mellitus, dyslipidemia and hypertension.  Hypertension This is a chronic problem. The current episode started more than 1 year ago. The problem is controlled. Pertinent negatives include no chest pain, headaches, palpitations or shortness of breath. Past treatments include ACE inhibitors. The current treatment provides moderate improvement.     Review of Systems  Constitutional: Positive for fatigue. Negative for unexpected weight change.  HENT: Negative for trouble swallowing and voice change.   Eyes: Negative for visual disturbance.  Respiratory: Negative for cough, shortness of breath and wheezing.   Cardiovascular: Negative for chest pain, palpitations and leg swelling.  Gastrointestinal: Negative for nausea, vomiting and diarrhea.  Endocrine: Negative for cold intolerance, heat intolerance, polydipsia, polyphagia and polyuria.  Musculoskeletal: Positive for gait problem. Negative for myalgias and arthralgias.  Skin: Negative for color change, pallor, rash and wound.  Neurological: Negative for seizures and headaches.  Psychiatric/Behavioral: Negative for suicidal ideas and confusion.    Objective:    BP 129/78 mmHg  Pulse 70  Ht 5\' 9"  (1.753 m)  Wt 197 lb (89.359 kg)  BMI 29.08 kg/m2  SpO2 97%  Wt Readings from Last 3 Encounters:  02/20/16 197 lb (89.359 kg)  10/10/15 202 lb (91.627 kg)  06/09/15 212 lb 12.8 oz (96.525 kg)    Physical Exam  Constitutional: She is oriented to person, place, and time. She appears well-developed.  HENT:  Head: Normocephalic and atraumatic.  Eyes: EOM are normal.  Neck: Normal range of motion. Neck supple. No tracheal deviation present. No thyromegaly  present.  Cardiovascular: Normal rate and regular rhythm.   Pulmonary/Chest: Effort normal and breath sounds normal.  Abdominal: Soft. Bowel sounds are normal. There is no tenderness. There is no guarding.  Musculoskeletal: Normal range of motion. She exhibits no edema.  Neurological: She is alert and oriented to person, place, and time. She has normal reflexes. No cranial nerve deficit. Coordination normal.  Skin: Skin is warm and dry. No rash noted. No erythema. No pallor.  Psychiatric: She has a normal mood and affect. Judgment normal.    Results for orders placed or performed in visit on [id number]  Basic metabolic panel  Result Value Ref Range   Glucose 177 (H) 65 - 99 mg/dL   BUN 25 8 - 27 mg/dL   Creatinine, Ser 0.83 0.57 - 1.00 mg/dL   GFR calc non Af Amer 65 >59 mL/min/1.73   GFR calc Af Amer 75 >59 mL/min/1.73   BUN/Creatinine Ratio 30 (H) 11 - 26   Sodium 145 (H) 134 - 144 mmol/L   Potassium 4.2 3.5 - 5.2 mmol/L   Chloride 102 96 - 106 mmol/L   CO2 20 18 - 29 mmol/L   Calcium 9.2 8.7 - 10.3 mg/dL  Hgb A1c w/o eAG  Result Value Ref Range   Hgb A1c MFr Bld 7.3 (H) 4.8 - 5.6 %   Diabetic Labs (most recent): Lab Results  Component Value Date   HGBA1C 7.3* 02/13/2016   HGBA1C 7.4* 09/27/2015     Assessment & Plan:   1. Uncontrolled type 2 diabetes mellitus without complication, without long-term current use of insulin ([place])  Patient came with  A1c of 7.3 %.  Recent labs reviewed. - Patient remains at a high risk for more acute and chronic complications of diabetes which include CAD, CVA, CKD, retinopathy, and neuropathy. These are all discussed in detail with the patient.  - I have re-counseled the patient on diet management and weight loss  by adopting a carbohydrate restricted / protein rich  Diet. - Patient is advised to stick to a routine mealtimes to eat 3 meals  a day and avoid unnecessary snacks ( to snack only to correct hypoglycemia).  - Suggestion is made  for patient to avoid simple carbohydrates   from their diet including Cakes , Desserts, Ice Cream,  Soda (  diet and regular) , Sweet Tea , Candies,  Chips, Cookies, Artificial Sweeteners,   and "Sugar-free" Products .  This will help patient to have stable blood glucose profile and potentially avoid unintended  Weight gain.  - The patient  has been  scheduled with Jearld Fenton, RDN, CDE for individualized DM education. - I have approached patient with the following individualized plan to manage diabetes and patient agrees.  She will continue MTF 1gm po BID and Januvia 100mg  po qday for now.  -Patient is encouraged to call clinic for blood glucose levels less than 70 or above 300 mg /dl.   - Patient specific target  for A1c; LDL, HDL, Triglycerides, and  Waist Circumference were discussed in detail.  2) BP/HTN: Controlled. Continue current medications including CEI. 3) Lipids/HPL:  continue statins. 4)  Weight/Diet: CDE consult in progress, exercise, and carbohydrates information provided. 5)  Vitamin D deficiency I will continue vitamin D therapy. Start 50,000 units weekly for the next 12 weeks  5) Chronic Care/Health Maintenance:  -Patien is on ACEI and Statin medications and encouraged to continue to follow up with Ophthalmology, Podiatrist at least yearly or according to recommendations, and advised to stay away from smoking. I have recommended yearly flu vaccine and pneumonia vaccination at least every 5 years; moderate intensity exercise for up to 150 minutes weekly; and  sleep for at least 7 hours a day.  I advised patient to maintain close follow up with their PCP for primary care needs.  Patient is asked to bring meter and  blood glucose logs during their next visit.   Follow up plan: Return in about 6 months (around 08/19/2016) for diabetes, high blood pressure, high cholesterol, follow up with pre-visit labs.  Glade Lloyd, MD Phone: [phone]  Fax: [phone]    02/20/2016, 1:15 PM

## 2016-02-20 NOTE — Patient Instructions (Signed)

## 2016-02-21 DIAGNOSIS — B351 Tinea unguium: Secondary | ICD-10-CM | POA: Diagnosis not present

## 2016-02-21 DIAGNOSIS — E1142 Type 2 diabetes mellitus with diabetic polyneuropathy: Secondary | ICD-10-CM | POA: Diagnosis not present

## 2016-03-01 DIAGNOSIS — Z6828 Body mass index (BMI) 28.0-28.9, adult: Secondary | ICD-10-CM | POA: Diagnosis not present

## 2016-03-01 DIAGNOSIS — I10 Essential (primary) hypertension: Secondary | ICD-10-CM | POA: Diagnosis not present

## 2016-03-01 DIAGNOSIS — E119 Type 2 diabetes mellitus without complications: Secondary | ICD-10-CM | POA: Diagnosis not present

## 2016-03-01 DIAGNOSIS — E782 Mixed hyperlipidemia: Secondary | ICD-10-CM | POA: Diagnosis not present

## 2016-03-01 DIAGNOSIS — Z1389 Encounter for screening for other disorder: Secondary | ICD-10-CM | POA: Diagnosis not present

## 2016-03-15 DIAGNOSIS — L570 Actinic keratosis: Secondary | ICD-10-CM | POA: Diagnosis not present

## 2016-03-15 DIAGNOSIS — B86 Scabies: Secondary | ICD-10-CM | POA: Diagnosis not present

## 2016-03-15 DIAGNOSIS — X32XXXD Exposure to sunlight, subsequent encounter: Secondary | ICD-10-CM | POA: Diagnosis not present

## 2016-06-15 DIAGNOSIS — E1142 Type 2 diabetes mellitus with diabetic polyneuropathy: Secondary | ICD-10-CM | POA: Diagnosis not present

## 2016-06-15 DIAGNOSIS — B351 Tinea unguium: Secondary | ICD-10-CM | POA: Diagnosis not present

## 2016-07-06 ENCOUNTER — Other Ambulatory Visit: Payer: Self-pay | Admitting: "Endocrinology

## 2016-07-12 DIAGNOSIS — R3915 Urgency of urination: Secondary | ICD-10-CM | POA: Diagnosis not present

## 2016-08-08 DIAGNOSIS — H61009 Unspecified perichondritis of external ear, unspecified ear: Secondary | ICD-10-CM | POA: Diagnosis not present

## 2016-08-08 DIAGNOSIS — D225 Melanocytic nevi of trunk: Secondary | ICD-10-CM | POA: Diagnosis not present

## 2016-08-14 DIAGNOSIS — E118 Type 2 diabetes mellitus with unspecified complications: Secondary | ICD-10-CM | POA: Diagnosis not present

## 2016-08-14 DIAGNOSIS — E1165 Type 2 diabetes mellitus with hyperglycemia: Secondary | ICD-10-CM | POA: Diagnosis not present

## 2016-08-15 LAB — BASIC METABOLIC PANEL
BUN/Creatinine Ratio: 19 (ref 12–28)
BUN: 20 mg/dL (ref 8–27)
CO2: 27 mmol/L (ref 18–29)
Calcium: 10.1 mg/dL (ref 8.7–10.3)
Chloride: 95 mmol/L — ABNORMAL LOW (ref 96–106)
Creatinine, Ser: 1.03 mg/dL — ABNORMAL HIGH (ref 0.57–1.00)
GFR calc Af Amer: 58 mL/min/{1.73_m2} — ABNORMAL LOW (ref >59)
GFR calc non Af Amer: 50 mL/min/{1.73_m2} — ABNORMAL LOW (ref >59)
Glucose: 271 mg/dL — ABNORMAL HIGH (ref 65–99)
Potassium: 5.1 mmol/L (ref 3.5–5.2)
Sodium: 137 mmol/L (ref 134–144)

## 2016-08-15 LAB — HEMOGLOBIN A1C
Est. average glucose Bld gHb Est-mCnc: 240 mg/dL
Hgb A1c MFr Bld: 10 % — ABNORMAL HIGH (ref 4.8–5.6)

## 2016-08-21 ENCOUNTER — Ambulatory Visit (INDEPENDENT_AMBULATORY_CARE_PROVIDER_SITE_OTHER): Payer: Medicare Other | Admitting: "Endocrinology

## 2016-08-21 ENCOUNTER — Encounter: Payer: Self-pay | Admitting: "Endocrinology

## 2016-08-21 VITALS — BP 153/77 | HR 74 | Ht 69.0 in | Wt 206.0 lb

## 2016-08-21 DIAGNOSIS — E785 Hyperlipidemia, unspecified: Secondary | ICD-10-CM

## 2016-08-21 DIAGNOSIS — E1165 Type 2 diabetes mellitus with hyperglycemia: Secondary | ICD-10-CM

## 2016-08-21 DIAGNOSIS — E118 Type 2 diabetes mellitus with unspecified complications: Secondary | ICD-10-CM

## 2016-08-21 DIAGNOSIS — E559 Vitamin D deficiency, unspecified: Secondary | ICD-10-CM

## 2016-08-21 DIAGNOSIS — I10 Essential (primary) hypertension: Secondary | ICD-10-CM | POA: Diagnosis not present

## 2016-08-21 DIAGNOSIS — IMO0002 Reserved for concepts with insufficient information to code with codable children: Secondary | ICD-10-CM

## 2016-08-21 DIAGNOSIS — E6609 Other obesity due to excess calories: Secondary | ICD-10-CM | POA: Diagnosis not present

## 2016-08-21 MED ORDER — VITAMIN D (ERGOCALCIFEROL) 1.25 MG (50000 UNIT) PO CAPS: 50000.0000 [IU] | ORAL_CAPSULE | ORAL | 0 refills | Status: DC

## 2016-08-21 MED ORDER — SITAGLIPTIN PHOSPHATE 100 MG PO TABS: 100.0000 mg | ORAL_TABLET | Freq: Every day | ORAL | 2 refills | Status: DC

## 2016-08-21 NOTE — Progress Notes (Signed)
Subjective:    Patient ID: [name], female    DOB: June 03, 1929,    Past Medical History:  Diagnosis Date  . Diabetes mellitus without complication ([place])   . Hypertension    Past Surgical History:  Procedure Laterality Date  . ABDOMINAL HYSTERECTOMY    . APPENDECTOMY    . BREAST SURGERY     begnin tumor removed   Social History   Social History  . Marital status: Married    Spouse name: N/A  . Number of children: N/A  . Years of education: N/A   Social History Main Topics  . Smoking status: Never Smoker  . Smokeless tobacco: Never Used  . Alcohol use No  . Drug use: No  . Sexual activity: Not Asked   Other Topics Concern  . None   Social History Narrative  . None   Outpatient Encounter Prescriptions as of 08/21/2016  Medication Sig  . ALPRAZolam (XANAX) 1 MG tablet Take 1 mg by mouth daily.   . furosemide (LASIX) 20 MG tablet Take 20 mg by mouth daily as needed (for fluid retention).   . gabapentin (NEURONTIN) 300 MG capsule Take 1 capsule by mouth 3  times daily  . Ibuprofen-Famotidine (DUEXIS) 800-26.6 MG TABS Take by mouth 2 (two) times daily.  [name] lisinopril (PRINIVIL,ZESTRIL) 20 MG tablet Take 20 mg by mouth 2 (two) times daily.  . metFORMIN (GLUCOPHAGE) 1000 MG tablet Take 1 tablet (1,000 mg total) by mouth 2 (two) times daily.  . metoprolol succinate (TOPROL-XL) 50 MG 24 hr tablet Take 50 mg by mouth daily. Take with or immediately following a meal.  . MYRBETRIQ 50 MG TB24 tablet Take 50 mg by mouth daily.  [name] nystatin-triamcinolone (MYCOLOG II) cream Apply 1 application topically 2 (two) times daily.  . ondansetron (ZOFRAN) 4 MG tablet Take 1 tablet (4 mg total) by mouth every 8 (eight) hours as needed for nausea or vomiting.  . simvastatin (ZOCOR) 20 MG tablet Take 20 mg by mouth every evening.  . sitaGLIPtin (JANUVIA) 100 MG tablet Take 1 tablet (100 mg total) by mouth daily.  [name] tiZANidine (ZANAFLEX) 2 MG tablet Take by mouth every 6 (six)  hours as needed for muscle spasms.  . traMADol (ULTRAM) 50 MG tablet Take 1 tablet (50 mg total) by mouth every 6 (six) hours as needed.  . Vitamin D, Ergocalciferol, (DRISDOL) 50000 units CAPS capsule Take 1 capsule (50,000 Units total) by mouth once a week.  . zolpidem (AMBIEN) 10 MG tablet Take 10 mg by mouth at bedtime as needed for sleep.  . [DISCONTINUED] sitaGLIPtin (JANUVIA) 50 MG tablet Take 1 tablet (50 mg total) by mouth daily.  . [DISCONTINUED] Vitamin D, Ergocalciferol, (DRISDOL) 50000 units CAPS capsule Take 1 capsule by mouth  every week   No facility-administered encounter medications on file as of 08/21/2016.    ALLERGIES: Allergies  Allergen Reactions  . Codeine Nausea And Vomiting   VACCINATION STATUS:  There is no immunization history on file for this patient.  Diabetes  [name] presents for [name] follow-up diabetic visit. [name] has type 2 diabetes mellitus. Onset time: [name] was diagnosed at approximate age of 80 years. [name] disease course has been worsening. There are no hypoglycemic associated symptoms. Pertinent negatives for hypoglycemia include no confusion, headaches, pallor or seizures. Associated symptoms include fatigue. Pertinent negatives for diabetes include no chest pain, no polydipsia, no polyphagia and no polyuria. There are no hypoglycemic complications. Symptoms are stable. There are no  diabetic complications. Risk factors for coronary artery disease include dyslipidemia, diabetes mellitus and hypertension. Current diabetic treatment includes oral agent (dual therapy). [name] weight is increasing steadily. [name] is following a generally healthy diet. [name] has had a previous visit with a dietitian. An ACE inhibitor/angiotensin II receptor blocker is being taken.  Hyperlipidemia  This is a chronic problem. The current episode started more than 1 year ago. Pertinent negatives include no chest pain, myalgias or shortness of breath. Current antihyperlipidemic treatment includes  statins. Risk factors for coronary artery disease include diabetes mellitus, dyslipidemia and hypertension.  Hypertension  This is a chronic problem. The current episode started more than 1 year ago. The problem is controlled. Pertinent negatives include no chest pain, headaches, palpitations or shortness of breath. Past treatments include ACE inhibitors. The current treatment provides moderate improvement.     Review of Systems  Constitutional: Positive for fatigue. Negative for unexpected weight change.  HENT: Negative for trouble swallowing and voice change.   Eyes: Negative for visual disturbance.  Respiratory: Negative for cough, shortness of breath and wheezing.   Cardiovascular: Negative for chest pain, palpitations and leg swelling.  Gastrointestinal: Negative for diarrhea, nausea and vomiting.  Endocrine: Negative for cold intolerance, heat intolerance, polydipsia, polyphagia and polyuria.  Musculoskeletal: Positive for gait problem. Negative for arthralgias and myalgias.  Skin: Negative for color change, pallor, rash and wound.  Neurological: Negative for seizures and headaches.  Psychiatric/Behavioral: Negative for confusion and suicidal ideas.    Objective:    BP (!) 153/77   Pulse 74   Ht 5\' 9"  (1.753 m)   Wt 206 lb (93.4 kg)   BMI 30.42 kg/m   Wt Readings from Last 3 Encounters:  08/21/16 206 lb (93.4 kg)  02/20/16 197 lb (89.4 kg)  10/10/15 202 lb (91.6 kg)    Physical Exam  Constitutional: [name] is oriented to person, place, and time. [name] appears well-developed.  HENT:  Head: Normocephalic and atraumatic.  Eyes: EOM are normal.  Neck: Normal range of motion. Neck supple. No tracheal deviation present. No thyromegaly present.  Cardiovascular: Normal rate and regular rhythm.   Pulmonary/Chest: Effort normal and breath sounds normal.  Abdominal: Soft. Bowel sounds are normal. There is no tenderness. There is no guarding.  Musculoskeletal: Normal range of motion.  [name] exhibits no edema.  Neurological: [name] is alert and oriented to person, place, and time. [name] has normal reflexes. No cranial nerve deficit. Coordination normal.  Skin: Skin is warm and dry. No rash noted. No erythema. No pallor.  Psychiatric: [name] has a normal mood and affect. Judgment normal.    Results for orders placed or performed in visit on [id number]  Basic metabolic panel  Result Value Ref Range   Glucose 271 (H) 65 - 99 mg/dL   BUN 20 8 - 27 mg/dL   Creatinine, Ser 1.03 (H) 0.57 - 1.00 mg/dL   GFR calc non Af Amer 50 (L) >59 mL/min/1.73   GFR calc Af Amer 58 (L) >59 mL/min/1.73   BUN/Creatinine Ratio 19 12 - 28   Sodium 137 134 - 144 mmol/L   Potassium 5.1 3.5 - 5.2 mmol/L   Chloride 95 (L) 96 - 106 mmol/L   CO2 27 18 - 29 mmol/L   Calcium 10.1 8.7 - 10.3 mg/dL  Hemoglobin A1c  Result Value Ref Range   Hgb A1c MFr Bld 10.0 (H) 4.8 - 5.6 %   Est. average glucose Bld gHb Est-mCnc 240 mg/dL   Diabetic Labs (most recent):  Lab Results  Component Value Date   HGBA1C 10.0 (H) 08/14/2016   HGBA1C 7.3 (H) 02/13/2016   HGBA1C 7.4 (H) 09/27/2015     Assessment & Plan:   1. Uncontrolled type 2 diabetes mellitus without complication, without long-term current use of insulin ([place])  Patient came with  Higher A1c of 10% increasing from 7.3% last visit . - Remarkably, [name] denies polydipsia/polyuria.  Recent labs reviewed. - Patient remains at a high risk for more acute and chronic complications of diabetes which include CAD, CVA, CKD, retinopathy, and neuropathy. These are all discussed in detail with the patient.  - I have re-counseled the patient on diet management and weight loss  by adopting a carbohydrate restricted / protein rich  Diet. - Patient is advised to stick to a routine mealtimes to eat 3 meals  a day and avoid unnecessary snacks ( to snack only to correct hypoglycemia).  - Suggestion is made for patient to avoid simple carbohydrates   from their diet including  Cakes , Desserts, Ice Cream,  Soda (  diet and regular) , Sweet Tea , Candies,  Chips, Cookies, Artificial Sweeteners,   and "Sugar-free" Products .  This will help patient to have stable blood glucose profile and potentially avoid unintended  Weight gain.  - The patient  has been  scheduled with Jearld Fenton, RDN, CDE for individualized DM education. - I have approached patient with the following individualized plan to manage diabetes and patient agrees.  [name] will likely require insulin therapy, however, I have approached [name] for monitoring blood glucose before meals and at bedtime for 2 weeks and return with meter and logs. -[name] will continue MTF 1gm po BID and Januvia 100mg  po qday for now.  -Patient is encouraged to call clinic for blood glucose levels less than 70 or above 300 mg /dl.   - Patient specific target  for A1c; LDL, HDL, Triglycerides, and  Waist Circumference were discussed in detail.  2) BP/HTN: Controlled. Continue current medications including CEI. 3) Lipids/HPL:  continue statins. 4)  Weight/Diet: CDE consult in progress, exercise, and carbohydrates information provided. 5)  Vitamin D deficiency I will continue vitamin D therapy, 50,000 units weekly for the next 12 weeks  5) Chronic Care/Health Maintenance:  -Patien is on ACEI and Statin medications and encouraged to continue to follow up with Ophthalmology, Podiatrist at least yearly or according to recommendations, and advised to stay away from smoking. I have recommended yearly flu vaccine and pneumonia vaccination at least every 5 years; moderate intensity exercise for up to 150 minutes weekly; and  sleep for at least 7 hours a day.  I advised patient to maintain close follow up with their PCP for primary care needs.  Patient is asked to bring meter and  blood glucose logs during their next visit.   Follow up plan: Return in about 2 weeks (around 09/04/2016) for follow up with meter and logs- no labs.  Glade Lloyd, MD Phone: [phone]  Fax: [phone]   08/21/2016, 11:51 AM

## 2016-08-22 ENCOUNTER — Other Ambulatory Visit: Payer: Self-pay

## 2016-08-22 MED ORDER — GLUCOSE BLOOD VI STRP: ORAL_STRIP | 5 refills | Status: DC

## 2016-08-23 DIAGNOSIS — H61009 Unspecified perichondritis of external ear, unspecified ear: Secondary | ICD-10-CM | POA: Diagnosis not present

## 2016-08-31 DIAGNOSIS — B351 Tinea unguium: Secondary | ICD-10-CM | POA: Diagnosis not present

## 2016-08-31 DIAGNOSIS — E1142 Type 2 diabetes mellitus with diabetic polyneuropathy: Secondary | ICD-10-CM | POA: Diagnosis not present

## 2016-09-06 ENCOUNTER — Encounter: Payer: Self-pay | Admitting: "Endocrinology

## 2016-09-06 ENCOUNTER — Other Ambulatory Visit: Payer: Self-pay | Admitting: "Endocrinology

## 2016-09-06 ENCOUNTER — Ambulatory Visit (INDEPENDENT_AMBULATORY_CARE_PROVIDER_SITE_OTHER): Payer: Medicare Other | Admitting: "Endocrinology

## 2016-09-06 VITALS — BP 152/73 | HR 61 | Wt 203.6 lb

## 2016-09-06 DIAGNOSIS — E559 Vitamin D deficiency, unspecified: Secondary | ICD-10-CM | POA: Diagnosis not present

## 2016-09-06 DIAGNOSIS — E118 Type 2 diabetes mellitus with unspecified complications: Secondary | ICD-10-CM

## 2016-09-06 DIAGNOSIS — I10 Essential (primary) hypertension: Secondary | ICD-10-CM

## 2016-09-06 DIAGNOSIS — E6609 Other obesity due to excess calories: Secondary | ICD-10-CM

## 2016-09-06 DIAGNOSIS — IMO0002 Reserved for concepts with insufficient information to code with codable children: Secondary | ICD-10-CM

## 2016-09-06 DIAGNOSIS — E785 Hyperlipidemia, unspecified: Secondary | ICD-10-CM | POA: Diagnosis not present

## 2016-09-06 DIAGNOSIS — E1165 Type 2 diabetes mellitus with hyperglycemia: Secondary | ICD-10-CM | POA: Diagnosis not present

## 2016-09-06 MED ORDER — GLIMEPIRIDE 2 MG PO TABS: 2.0000 mg | ORAL_TABLET | Freq: Every day | ORAL | 3 refills | Status: DC

## 2016-09-06 NOTE — Progress Notes (Signed)
Subjective:    Patient ID: [name], female    DOB: [birth year],    Past Medical History:  Diagnosis Date  . Diabetes mellitus without complication ([place])   . Hypertension    Past Surgical History:  Procedure Laterality Date  . ABDOMINAL HYSTERECTOMY    . APPENDECTOMY    . BREAST SURGERY     begnin tumor removed   Social History   Social History  . Marital status: Married    Spouse name: N/A  . Number of children: N/A  . Years of education: N/A   Social History Main Topics  . Smoking status: Never Smoker  . Smokeless tobacco: Never Used  . Alcohol use No  . Drug use: No  . Sexual activity: Not Asked   Other Topics Concern  . None   Social History Narrative  . None   Outpatient Encounter Prescriptions as of 09/06/2016  Medication Sig  . ALPRAZolam (XANAX) 1 MG tablet Take 1 mg by mouth daily.   . furosemide (LASIX) 20 MG tablet Take 20 mg by mouth daily as needed (for fluid retention).   . gabapentin (NEURONTIN) 300 MG capsule Take 1 capsule by mouth 3  times daily  . glucose blood test strip Use as instructed 4 x daily. E11.65. One touch Verio  . Ibuprofen-Famotidine (DUEXIS) 800-26.6 MG TABS Take by mouth 2 (two) times daily.  [name] lisinopril (PRINIVIL,ZESTRIL) 20 MG tablet Take 20 mg by mouth 2 (two) times daily.  . metFORMIN (GLUCOPHAGE) 1000 MG tablet Take 1 tablet (1,000 mg total) by mouth 2 (two) times daily.  . metoprolol succinate (TOPROL-XL) 50 MG 24 hr tablet Take 50 mg by mouth daily. Take with or immediately following a meal.  . MYRBETRIQ 50 MG TB24 tablet Take 50 mg by mouth daily.  [name] nystatin-triamcinolone (MYCOLOG II) cream Apply 1 application topically 2 (two) times daily.  . ondansetron (ZOFRAN) 4 MG tablet Take 1 tablet (4 mg total) by mouth every 8 (eight) hours as needed for nausea or vomiting.  . simvastatin (ZOCOR) 20 MG tablet Take 20 mg by mouth every evening.  . sitaGLIPtin (JANUVIA) 100 MG tablet Take 1 tablet (100 mg total) by  mouth daily.  [name] tiZANidine (ZANAFLEX) 2 MG tablet Take by mouth every 6 (six) hours as needed for muscle spasms.  . traMADol (ULTRAM) 50 MG tablet Take 1 tablet (50 mg total) by mouth every 6 (six) hours as needed.  . Vitamin D, Ergocalciferol, (DRISDOL) 50000 units CAPS capsule Take 1 capsule (50,000 Units total) by mouth once a week.  [name] glimepiride (AMARYL) 2 MG tablet Take 1 tablet (2 mg total) by mouth daily with breakfast.  . zolpidem (AMBIEN) 10 MG tablet Take 10 mg by mouth at bedtime as needed for sleep.   No facility-administered encounter medications on file as of 09/06/2016.    ALLERGIES: Allergies  Allergen Reactions  . Codeine Nausea And Vomiting   VACCINATION STATUS:  There is no immunization history on file for this patient.  Diabetes  She presents for her follow-up diabetic visit. She has type 2 diabetes mellitus. Onset time: She was diagnosed at approximate age of 18 years. Her disease course has been improving. There are no hypoglycemic associated symptoms. Pertinent negatives for hypoglycemia include no confusion, headaches, pallor or seizures. Associated symptoms include fatigue. Pertinent negatives for diabetes include no chest pain, no polydipsia, no polyphagia and no polyuria. There are no hypoglycemic complications. Symptoms are improving. There are no diabetic complications.  Risk factors for coronary artery disease include dyslipidemia, diabetes mellitus and hypertension. Current diabetic treatment includes oral agent (dual therapy). Her weight is decreasing steadily. She is following a generally healthy diet. She has had a previous visit with a dietitian. Her breakfast blood glucose range is generally >200 mg/dl. Her lunch blood glucose range is generally >200 mg/dl. Her dinner blood glucose range is generally >200 mg/dl. Her overall blood glucose range is >200 mg/dl. An ACE inhibitor/angiotensin II receptor blocker is being taken.  Hyperlipidemia  This is a chronic  problem. The current episode started more than 1 year ago. Pertinent negatives include no chest pain, myalgias or shortness of breath. Current antihyperlipidemic treatment includes statins. Risk factors for coronary artery disease include diabetes mellitus, dyslipidemia and hypertension.  Hypertension  This is a chronic problem. The current episode started more than 1 year ago. The problem is controlled. Pertinent negatives include no chest pain, headaches, palpitations or shortness of breath. Past treatments include ACE inhibitors. The current treatment provides moderate improvement.     Review of Systems  Constitutional: Positive for fatigue. Negative for unexpected weight change.  HENT: Negative for trouble swallowing and voice change.   Eyes: Negative for visual disturbance.  Respiratory: Negative for cough, shortness of breath and wheezing.   Cardiovascular: Negative for chest pain, palpitations and leg swelling.  Gastrointestinal: Negative for diarrhea, nausea and vomiting.  Endocrine: Negative for cold intolerance, heat intolerance, polydipsia, polyphagia and polyuria.  Musculoskeletal: Positive for gait problem. Negative for arthralgias and myalgias.  Skin: Negative for color change, pallor, rash and wound.  Neurological: Negative for seizures and headaches.  Psychiatric/Behavioral: Negative for confusion and suicidal ideas.    Objective:    BP (!) 152/73   Pulse 61   Wt 203 lb 9.6 oz (92.4 kg)   BMI 30.07 kg/m   Wt Readings from Last 3 Encounters:  09/06/16 203 lb 9.6 oz (92.4 kg)  08/21/16 206 lb (93.4 kg)  02/20/16 197 lb (89.4 kg)    Physical Exam  Constitutional: She is oriented to person, place, and time. She appears well-developed.  HENT:  Head: Normocephalic and atraumatic.  Eyes: EOM are normal.  Neck: Normal range of motion. Neck supple. No tracheal deviation present. No thyromegaly present.  Cardiovascular: Normal rate and regular rhythm.   Pulmonary/Chest:  Effort normal and breath sounds normal.  Abdominal: Soft. Bowel sounds are normal. There is no tenderness. There is no guarding.  Musculoskeletal: Normal range of motion. She exhibits no edema.  Neurological: She is alert and oriented to person, place, and time. She has normal reflexes. No cranial nerve deficit. Coordination normal.  Skin: Skin is warm and dry. No rash noted. No erythema. No pallor.  Psychiatric: She has a normal mood and affect. Judgment normal.    Results for orders placed or performed in visit on [id number]  Basic metabolic panel  Result Value Ref Range   Glucose 271 (H) 65 - 99 mg/dL   BUN 20 8 - 27 mg/dL   Creatinine, Ser 1.03 (H) 0.57 - 1.00 mg/dL   GFR calc non Af Amer 50 (L) >59 mL/min/1.73   GFR calc Af Amer 58 (L) >59 mL/min/1.73   BUN/Creatinine Ratio 19 12 - 28   Sodium 137 134 - 144 mmol/L   Potassium 5.1 3.5 - 5.2 mmol/L   Chloride 95 (L) 96 - 106 mmol/L   CO2 27 18 - 29 mmol/L   Calcium 10.1 8.7 - 10.3 mg/dL  Hemoglobin A1c  Result Value  Ref Range   Hgb A1c MFr Bld 10.0 (H) 4.8 - 5.6 %   Est. average glucose Bld gHb Est-mCnc 240 mg/dL   Diabetic Labs (most recent): Lab Results  Component Value Date   HGBA1C 10.0 (H) 08/14/2016   HGBA1C 7.3 (H) 02/13/2016   HGBA1C 7.4 (H) 09/27/2015     Assessment & Plan:   1. Uncontrolled type 2 diabetes mellitus without complication, without long-term current use of insulin ([place])  Patient came with  Higher A1c of 10% increasing from 7.3% last visit . - Remarkably, she denies polydipsia/polyuria.  Recent labs reviewed. - Patient remains at a high risk for more acute and chronic complications of diabetes which include CAD, CVA, CKD, retinopathy, and neuropathy. These are all discussed in detail with the patient.  - I have re-counseled the patient on diet management and weight loss  by adopting a carbohydrate restricted / protein rich  Diet. - Patient is advised to stick to a routine mealtimes to eat 3 meals  a  day and avoid unnecessary snacks ( to snack only to correct hypoglycemia).  - Suggestion is made for patient to avoid simple carbohydrates   from their diet including Cakes , Desserts, Ice Cream,  Soda (  diet and regular) , Sweet Tea , Candies,  Chips, Cookies, Artificial Sweeteners,   and "Sugar-free" Products .  This will help patient to have stable blood glucose profile and potentially avoid unintended  Weight gain.  - The patient  has been  scheduled with Jearld Fenton, RDN, CDE for individualized DM education. - I have approached patient with the following individualized plan to manage diabetes and patient agrees.   - Over the last 2 weeks she has seen slow improvement of her severe hyperglycemia although they are still above target averaging more than 200.  - She would like to avoid insulin treatment, although she may eventually need it.  -She will continue MTF 1gm po BID and Januvia 100mg  po qday for now. She is complaining of the cost of Januvia. I advised her to finish what she has available and discontinue after that. - I will add glimepiride 2 mg daily with breakfast. Side effects and precautions discussed with her.  -  I have approached her for monitoring blood glucose before  breakfast and at bedtime.  -Patient is encouraged to call clinic for blood glucose levels less than 70 or above 300 mg /dl.   - Patient specific target  for A1c; LDL, HDL, Triglycerides, and  Waist Circumference were discussed in detail.  2) BP/HTN: Controlled. Continue current medications including CEI. 3) Lipids/HPL:  continue statins. 4)  Weight/Diet: CDE consult in progress, exercise, and carbohydrates information provided. 5)  Vitamin D deficiency - She is on 12 weeks  vitamin D therapy, 50,000 units weekly . 5) Chronic Care/Health Maintenance:  -Patien is on ACEI and Statin medications and encouraged to continue to follow up with Ophthalmology, Podiatrist at least yearly or according to  recommendations, and advised to stay away from smoking. I have recommended yearly flu vaccine and pneumonia vaccination at least every 5 years; moderate intensity exercise for up to 150 minutes weekly; and  sleep for at least 7 hours a day.  I advised patient to maintain close follow up with their PCP for primary care needs.  Patient is asked to bring meter and  blood glucose logs during their next visit.   Follow up plan: Return in about 8 weeks (around 11/01/2016) for follow up with pre-visit labs,  meter, and logs.  Glade Lloyd, MD Phone: [phone]  Fax: [phone]   09/06/2016, 11:45 AM

## 2016-09-06 NOTE — Patient Instructions (Signed)

## 2016-09-18 DIAGNOSIS — H35033 Hypertensive retinopathy, bilateral: Secondary | ICD-10-CM | POA: Diagnosis not present

## 2016-09-18 DIAGNOSIS — E119 Type 2 diabetes mellitus without complications: Secondary | ICD-10-CM | POA: Diagnosis not present

## 2016-09-20 DIAGNOSIS — H61009 Unspecified perichondritis of external ear, unspecified ear: Secondary | ICD-10-CM | POA: Diagnosis not present

## 2016-10-04 DIAGNOSIS — L0292 Furuncle, unspecified: Secondary | ICD-10-CM | POA: Diagnosis not present

## 2016-10-04 DIAGNOSIS — Z6830 Body mass index (BMI) 30.0-30.9, adult: Secondary | ICD-10-CM | POA: Diagnosis not present

## 2016-10-04 DIAGNOSIS — N611 Abscess of the breast and nipple: Secondary | ICD-10-CM | POA: Diagnosis not present

## 2016-10-04 DIAGNOSIS — B9562 Methicillin resistant Staphylococcus aureus infection as the cause of diseases classified elsewhere: Secondary | ICD-10-CM | POA: Diagnosis not present

## 2016-10-04 DIAGNOSIS — E6609 Other obesity due to excess calories: Secondary | ICD-10-CM | POA: Diagnosis not present

## 2016-10-04 DIAGNOSIS — Z1389 Encounter for screening for other disorder: Secondary | ICD-10-CM | POA: Diagnosis not present

## 2016-10-11 DIAGNOSIS — H61001 Unspecified perichondritis of right external ear: Secondary | ICD-10-CM | POA: Diagnosis not present

## 2016-10-22 ENCOUNTER — Other Ambulatory Visit: Payer: Self-pay

## 2016-10-22 MED ORDER — GABAPENTIN 300 MG PO CAPS: 300.0000 mg | ORAL_CAPSULE | Freq: Three times a day (TID) | ORAL | 2 refills | Status: DC

## 2016-11-06 ENCOUNTER — Ambulatory Visit: Payer: Medicare Other | Admitting: "Endocrinology

## 2016-11-07 DIAGNOSIS — M170 Bilateral primary osteoarthritis of knee: Secondary | ICD-10-CM | POA: Diagnosis not present

## 2016-11-07 DIAGNOSIS — M1711 Unilateral primary osteoarthritis, right knee: Secondary | ICD-10-CM | POA: Diagnosis not present

## 2016-11-07 DIAGNOSIS — M25561 Pain in right knee: Secondary | ICD-10-CM | POA: Diagnosis not present

## 2016-11-07 DIAGNOSIS — M25562 Pain in left knee: Secondary | ICD-10-CM | POA: Diagnosis not present

## 2016-11-08 DIAGNOSIS — L821 Other seborrheic keratosis: Secondary | ICD-10-CM | POA: Diagnosis not present

## 2016-11-08 DIAGNOSIS — H61001 Unspecified perichondritis of right external ear: Secondary | ICD-10-CM | POA: Diagnosis not present

## 2016-11-09 DIAGNOSIS — E1142 Type 2 diabetes mellitus with diabetic polyneuropathy: Secondary | ICD-10-CM | POA: Diagnosis not present

## 2016-11-09 DIAGNOSIS — B351 Tinea unguium: Secondary | ICD-10-CM | POA: Diagnosis not present

## 2016-11-12 DIAGNOSIS — Z6830 Body mass index (BMI) 30.0-30.9, adult: Secondary | ICD-10-CM | POA: Diagnosis not present

## 2016-11-12 DIAGNOSIS — M179 Osteoarthritis of knee, unspecified: Secondary | ICD-10-CM | POA: Diagnosis not present

## 2016-11-12 DIAGNOSIS — Z1389 Encounter for screening for other disorder: Secondary | ICD-10-CM | POA: Diagnosis not present

## 2016-11-12 DIAGNOSIS — E6609 Other obesity due to excess calories: Secondary | ICD-10-CM | POA: Diagnosis not present

## 2016-11-12 DIAGNOSIS — Z23 Encounter for immunization: Secondary | ICD-10-CM | POA: Diagnosis not present

## 2016-11-13 ENCOUNTER — Ambulatory Visit: Payer: Medicare Other | Admitting: "Endocrinology

## 2016-11-19 DIAGNOSIS — M170 Bilateral primary osteoarthritis of knee: Secondary | ICD-10-CM | POA: Diagnosis not present

## 2016-11-19 DIAGNOSIS — M25561 Pain in right knee: Secondary | ICD-10-CM | POA: Diagnosis not present

## 2016-11-19 DIAGNOSIS — M25661 Stiffness of right knee, not elsewhere classified: Secondary | ICD-10-CM | POA: Diagnosis not present

## 2016-11-19 DIAGNOSIS — M25562 Pain in left knee: Secondary | ICD-10-CM | POA: Diagnosis not present

## 2016-12-27 ENCOUNTER — Ambulatory Visit (HOSPITAL_COMMUNITY): Payer: PRIVATE HEALTH INSURANCE

## 2017-01-02 ENCOUNTER — Ambulatory Visit (HOSPITAL_COMMUNITY): Payer: Medicare Other | Attending: Family Medicine | Admitting: Physical Therapy

## 2017-01-02 DIAGNOSIS — M25661 Stiffness of right knee, not elsewhere classified: Secondary | ICD-10-CM | POA: Insufficient documentation

## 2017-01-02 DIAGNOSIS — R262 Difficulty in walking, not elsewhere classified: Secondary | ICD-10-CM | POA: Diagnosis not present

## 2017-01-02 DIAGNOSIS — R2681 Unsteadiness on feet: Secondary | ICD-10-CM | POA: Diagnosis not present

## 2017-01-02 DIAGNOSIS — M6281 Muscle weakness (generalized): Secondary | ICD-10-CM | POA: Diagnosis not present

## 2017-01-02 DIAGNOSIS — M25662 Stiffness of left knee, not elsewhere classified: Secondary | ICD-10-CM

## 2017-01-02 NOTE — Patient Instructions (Signed)
   SEATED HAMSTRING STRETCH  While seated, rest your heel on the floor with your knee straight and gently lean forward until a stretch is felt behind your knee/thigh.  Hold for 30 seconds and relax.   Repeat 2-3 times each side, twice a day.    PIRIFORMIS AND HIP STRETCH - SEATED  While sitting in a chair, cross your affected leg on top of the other as shown.   Next, gently lean forward until a stretch is felt along the crossed leg.  DO NOT HUNCH LIKE THE FELLOW IN THE PICTURE; KEEP GOOD POSTURE AND LEAN FORWARD AT YOUR HIPS.  Hold for 30 seconds and relax.  Repeat 2-3 times each side, twice a day.    QUAD SET  Tighten your top thigh muscle as you attempt to press the back of your knee downward towards the table.  Hold for 2-3 seconds, then relax.   Repeat 10-15 times each leg, twice a day.

## 2017-01-02 NOTE — Therapy (Signed)
[place], [place], [address] Phone: [phone]   Fax:  [phone]  Physical Therapy Evaluation  Patient Details  Name: [name] MRN: [id number] Date of Birth: 07/06/1929 Referring Provider: Sharilyn Sites   Encounter Date: 01/02/2017      PT End of Session - 01/02/17 1746    Visit Number 1   Number of Visits 12   Date for PT Re-Evaluation 02/13/17   Authorization Type AARP/Medicare secondary    Authorization Time Period 01/02/17 to 02/13/17   Authorization - Visit Number 1   Authorization - Number of Visits 10   PT Start Time [id number]   PT Stop Time 1642   PT Time Calculation (min) 39 min   Activity Tolerance Patient tolerated treatment well;Patient limited by fatigue   Behavior During Therapy [facility] for tasks assessed/performed      Past Medical History:  Diagnosis Date  . Diabetes mellitus without complication ([place])   . Hypertension     Past Surgical History:  Procedure Laterality Date  . ABDOMINAL HYSTERECTOMY    . APPENDECTOMY    . BREAST SURGERY     begnin tumor removed    There were no vitals filed for this visit.       Subjective Assessment - 01/02/17 1607    Subjective Patient states that her knees just hurt; she has not had any falls or injury to them whatsoever. She [facility] go [facility] but wants [place] to report to Dr. Hilma Favors, she wanted to get therapy locally. The hardest thing for her to do right now is walking, she is scared of falling and having something else come up. No falls or close calls recently. Sometimes her knee will try to give away. Her R knee is the worst and is her main concern.    Pertinent History on beta-blockers, HTN, DM, back pain (spondylosis and stenosis)   How long can you sit comfortably? unlimited    How long can you stand comfortably? less than 5 minutes    How long can you walk comfortably? unlimited, has not noticed any problems    Patient Stated Goals  get rid of cane, get rid of wheelchair    Currently in Pain? No/denies  at worst, 8/10 pain in her knees             [facility] PT Assessment - 01/02/17 0001      Assessment   Medical Diagnosis knee ROM, balance, gait    Referring Provider Sharilyn Sites    Onset Date/Surgical Date --  chronic    Next MD Visit not scheduled    Prior Therapy none      Balance Screen   Has the patient fallen in the past 6 months No   Has the patient had a decrease in activity level because of a fear of falling?  Yes   Is the patient reluctant to leave their home because of a fear of falling?  No     Prior Function   Level of Independence Independent;Independent with basic ADLs;Independent with gait;Independent with transfers   Vocation Retired     AROM   Right Knee Extension 5   Right Knee Flexion 125   Left Knee Extension 8   Left Knee Flexion 123   Right Ankle Dorsiflexion 0   Left Ankle Dorsiflexion 2     Strength   Right Hip Flexion 3+/5   Right Hip ABduction 3/5   Left Hip Flexion 3+/5  Left Hip ABduction 3+/5   Right Knee Flexion 4+/5   Right Knee Extension 4+/5   Left Knee Flexion 4+/5   Left Knee Extension 4+/5   Right Ankle Dorsiflexion 5/5   Left Ankle Dorsiflexion 5/5     Flexibility   Hamstrings severe limitation B  in 90-90 test    Piriformis moderate limitation B      Ambulation/Gait   Gait Comments ER R LE, reduced ankle DF, reduced knee ROM, reduced gait speed, proximal weakness      6 minute walk test results    Aerobic Endurance Distance Walked 154   Endurance additional comments 3MWT, SPC, one rest break      High Level Balance   High Level Balance Comments 18.00 with SPC                           PT Education - 01/02/17 1745    Education provided Yes   Education Details prognosis, POC, HEP, advice for compliance with HEP/importance of HEP    Person(s) Educated Patient   Methods Explanation;Demonstration;Handout   Comprehension  Verbalized understanding;Returned demonstration;Need further instruction          PT Short Term Goals - 01/02/17 1751      PT SHORT TERM GOAL #1   Title Patient to demonstrate B knee extension as being within 2 degrees of zero in order to improve mechanics and gait pattern    Time 3   Period Weeks   Status New     PT SHORT TERM GOAL #2   Title Patient to demonstrate at least a 50% improvement in bilateral hamstring and piriformis flexiblity in order to reduce pain and improve mechanics    Time 3   Period Weeks   Status New     PT SHORT TERM GOAL #3   Title Patient to demonstrate bilateral ankle DF range of at least 8-10 degrees in order to improve gait mechanics and efficiency    Time 3   Period Weeks   Status New     PT SHORT TERM GOAL #4   Title Patient to be able to tolerate at least 20 minutes of closed chain activity with no fatigue or pain exacerbation in order to show improvement in condition    Time 3   Period Weeks   Status New     PT SHORT TERM GOAL #5   Title Patient to be independent in correctly and consistently performing HEP, to be updated PRN    Time 1   Period Weeks   Status New           PT Long Term Goals - 01/02/17 1754      PT LONG TERM GOAL #1   Title Patient to demonstrate strength as being at least 4/5 in all tested muscle groups in order to reduce pain and improve standing tolerance    Time 6   Period Weeks   Status New     PT LONG TERM GOAL #2   Title Patient to be able to ambulate at least 462ft during 3MWT in order to show improved mobility and gait efficiency    Time 6   Period Weeks   Status New     PT LONG TERM GOAL #3   Title Patient to be able to complete TUG in 13 seconds or less in order to show improved mobility and balance skills    Time 6   Period Weeks  Status New     PT LONG TERM GOAL #4   Title Patient to be able to tolerate at least 30-40 minutes of closed chain activities with no fatigue or pain exacerbation  in order to show general improvement of condition    Time [place] - 01/02/17 1746    Clinical Impression Statement Patient arrives with B knee DKD and stiffness; she reports that she has quite a bit of trouble being up on her feet and sometimes it feels as if her knee, the right especially, will give out due to pain. She would ultimately like to get stronger and try to get rid of her cane/wheelchair overall. Examination reveals gait deviation, significant functional weakness, stiffness in bilateral ankles and knees, general unsteadiness, reduced muscle flexibility, poor functional activity tolerance, and reduce functional task tolerance in general. Patient is currently a significant fall risk at this time. She will benefit from skilled PT services to address functional deficits, reduce fall risk, and assist in reaching optimal level of function.    Rehab Potential Good   Clinical Impairments Affecting Rehab Potential (+) motivated to participate in skilled PT services; (-) fairly sedentary currently, chronic deficits    PT Frequency 2x / week   PT Duration 6 weeks   PT Treatment/Interventions ADLs/Self Care Home Management;Biofeedback;Cryotherapy;Moist Heat;DME Instruction;Gait training;Stair training;Functional mobility training;Therapeutic activities;Therapeutic exercise;Balance training;Neuromuscular re-education;Patient/family education;Manual techniques;Passive range of motion;Energy conservation;Taping   PT Next Visit Plan review HEP and goals; knee extension and ankle DF stretching and ROM; knee and ankle mobilizations; gait training, functional stretching and proximal strengthening, balance    PT Home Exercise Plan Eval: hamstring stretch, piriformis stretch, quad sets    Consulted and Agree with Plan of Care Patient      Patient will benefit from skilled therapeutic intervention in order to improve the following deficits and impairments:   Abnormal gait, Increased fascial restricitons, Improper body mechanics, Pain, Decreased coordination, Decreased mobility, Postural dysfunction, Decreased activity tolerance, Decreased strength, Hypomobility, Decreased balance, Difficulty walking, Impaired flexibility  Visit Diagnosis: Stiffness of left knee, not elsewhere classified - Plan: PT plan of care cert/re-cert  Stiffness of right knee, not elsewhere classified - Plan: PT plan of care cert/re-cert  Difficulty in walking, not elsewhere classified - Plan: PT plan of care cert/re-cert  Muscle weakness (generalized) - Plan: PT plan of care cert/re-cert  Unsteadiness on feet - Plan: PT plan of care cert/re-cert      G-Codes - [id number]    Functional Assessment Tool Used Based on skilled clinical assessment of strength, ROM, flexiblity, gait, balance, functional activity tolerance    Functional Limitation Mobility: Walking and moving around   Mobility: Walking and Moving Around Current Status [id number]) At least 60 percent but less than 80 percent impaired, limited or restricted   Mobility: Walking and Moving Around Goal Status [id number]) At least 40 percent but less than 60 percent impaired, limited or restricted       Problem List Patient Active Problem List   Diagnosis Date Noted  . Hyperlipidemia 02/20/2016  . Essential hypertension, benign 10/10/2015  . Vitamin D deficiency 10/10/2015  . Obesity due to excess calories 10/10/2015  . HIP PAIN 01/12/2009  . SPINAL STENOSIS 01/12/2009  . LOW BACK PAIN 01/12/2009  . SPONDYLOLYSIS 01/12/2009  . SHOULDER PAIN 10/04/2008  . Type 2 diabetes mellitus, uncontrolled ([place]) 10/01/2008    Cyril Mourning  Hanley Hays, DPT [phone]  [facility] [address] [place], [place], [address] Phone: [phone]   Fax:  [phone]  Name: [name] MRN: [id number] Date of Birth: 09/22/1929

## 2017-01-09 ENCOUNTER — Encounter (HOSPITAL_COMMUNITY): Payer: PRIVATE HEALTH INSURANCE | Admitting: Physical Therapy

## 2017-01-11 ENCOUNTER — Ambulatory Visit (HOSPITAL_COMMUNITY): Payer: Medicare Other | Admitting: Physical Therapy

## 2017-01-11 DIAGNOSIS — M25661 Stiffness of right knee, not elsewhere classified: Secondary | ICD-10-CM | POA: Diagnosis not present

## 2017-01-11 DIAGNOSIS — M25662 Stiffness of left knee, not elsewhere classified: Secondary | ICD-10-CM

## 2017-01-11 DIAGNOSIS — R2681 Unsteadiness on feet: Secondary | ICD-10-CM | POA: Diagnosis not present

## 2017-01-11 DIAGNOSIS — M6281 Muscle weakness (generalized): Secondary | ICD-10-CM | POA: Diagnosis not present

## 2017-01-11 DIAGNOSIS — R262 Difficulty in walking, not elsewhere classified: Secondary | ICD-10-CM

## 2017-01-11 NOTE — Therapy (Signed)
[place], [place], [address] Phone: [phone]   Fax:  [phone]  Physical Therapy Treatment  Patient Details  Name: [name] MRN: [id number] Date of Birth: 1929-05-24 Referring Provider: Sharilyn Sites   Encounter Date: 01/11/2017      PT End of Session - 01/11/17 0950    Visit Number 2   Number of Visits 12   Date for PT Re-Evaluation 02/13/17   Authorization Type AARP/Medicare secondary    Authorization Time Period 01/02/17 to 02/13/17   Authorization - Visit Number 2   Authorization - Number of Visits 10   PT Start Time 0915   PT Stop Time 0950   PT Time Calculation (min) 35 min   Activity Tolerance Patient tolerated treatment well;Patient limited by fatigue   Behavior During Therapy [facility] for tasks assessed/performed      Past Medical History:  Diagnosis Date  . Diabetes mellitus without complication ([place])   . Hypertension     Past Surgical History:  Procedure Laterality Date  . ABDOMINAL HYSTERECTOMY    . APPENDECTOMY    . BREAST SURGERY     begnin tumor removed    There were no vitals filed for this visit.      Subjective Assessment - 01/11/17 0945    Subjective Pt states her knees are hurting, Rt worse than Left 4/10 pain.  Reports she has been completing her HEP (stretches) at home.   Currently in Pain? Yes   Pain Score 4    Pain Location Knee   Pain Orientation Right;Left   Pain Descriptors / Indicators Aching                         [facility] Adult PT Treatment/Exercise - 01/11/17 0001      Knee/Hip Exercises: Stretches   Active Hamstring Stretch Both;3 reps;30 seconds   Piriformis Stretch 2 reps;30 seconds     Knee/Hip Exercises: Supine   Quad Sets Both;10 reps   Short Arc Quad Sets Both;10 reps  5 second holds   Heel Slides Both;5 reps   Bridges Both;10 reps     Knee/Hip Exercises: Sidelying   Hip ABduction Both;2 sets;10 reps  with AAROM from  therapist to complete in correct form.     Knee/Hip Exercises: Prone   Hamstring Curl 10 reps   Other Prone Exercises heelsqueeze 10 reps 5" holds                PT Education - 01/11/17 0946    Education provided Yes   Education Details reviewed initial evaluation, HEP and given copy of evaluation.  initiated new strengthening exercises.   Person(s) Educated Patient   Methods Explanation;Demonstration;Tactile cues;Verbal cues;Handout   Comprehension Verbalized understanding;Returned demonstration;Verbal cues required;Tactile cues required;Need further instruction          PT Short Term Goals - 01/02/17 1751      PT SHORT TERM GOAL #1   Title Patient to demonstrate B knee extension as being within 2 degrees of zero in order to improve mechanics and gait pattern    Time 3   Period Weeks   Status New     PT SHORT TERM GOAL #2   Title Patient to demonstrate at least a 50% improvement in bilateral hamstring and piriformis flexiblity in order to reduce pain and improve mechanics    Time 3   Period Weeks   Status New  PT SHORT TERM GOAL #3   Title Patient to demonstrate bilateral ankle DF range of at least 8-10 degrees in order to improve gait mechanics and efficiency    Time 3   Period Weeks   Status New     PT SHORT TERM GOAL #4   Title Patient to be able to tolerate at least 20 minutes of closed chain activity with no fatigue or pain exacerbation in order to show improvement in condition    Time 3   Period Weeks   Status New     PT SHORT TERM GOAL #5   Title Patient to be independent in correctly and consistently performing HEP, to be updated PRN    Time 1   Period Weeks   Status New           PT Long Term Goals - 01/02/17 1754      PT LONG TERM GOAL #1   Title Patient to demonstrate strength as being at least 4/5 in all tested muscle groups in order to reduce pain and improve standing tolerance    Time 6   Period Weeks   Status New     PT LONG  TERM GOAL #2   Title Patient to be able to ambulate at least 420ft during 3MWT in order to show improved mobility and gait efficiency    Time 6   Period Weeks   Status New     PT LONG TERM GOAL #3   Title Patient to be able to complete TUG in 13 seconds or less in order to show improved mobility and balance skills    Time 6   Period Weeks   Status New     PT LONG TERM GOAL #4   Title Patient to be able to tolerate at least 30-40 minutes of closed chain activities with no fatigue or pain exacerbation in order to show general improvement of condition    Time 6   Period Weeks   Status New               Plan - 01/11/17 [id number]    Clinical Impression Statement Pt arrived late due to inclement weather/difficulty getting out of her driveway.  Pt compliant with stretches given at HEP and doing well overall.  Comes today ambulating with SPC (was in wheelchair last session).  Reviewed intial evauation and goals for therapy.  Initiated new strengtheing exercises for bilateral LE's. Pt required cues to complete all exercises more slowly/controlled.  Unable to complete hip extension in prone against gravity.     Rehab Potential Good   Clinical Impairments Affecting Rehab Potential (+) motivated to participate in skilled PT services; (-) fairly sedentary currently, chronic deficits    PT Frequency 2x / week   PT Duration 6 weeks   PT Treatment/Interventions ADLs/Self Care Home Management;Biofeedback;Cryotherapy;Moist Heat;DME Instruction;Gait training;Stair training;Functional mobility training;Therapeutic activities;Therapeutic exercise;Balance training;Neuromuscular re-education;Patient/family education;Manual techniques;Passive range of motion;Energy conservation;Taping   PT Next Visit Plan review HEP and goals; knee extension and ankle DF stretching and ROM; knee and ankle mobilizations; gait training, functional stretching and proximal strengthening, balance    PT Home Exercise Plan Eval:  hamstring stretch, piriformis stretch, quad sets    Consulted and Agree with Plan of Care Patient      Patient will benefit from skilled therapeutic intervention in order to improve the following deficits and impairments:  Abnormal gait, Increased fascial restricitons, Improper body mechanics, Pain, Decreased coordination, Decreased mobility, Postural dysfunction, Decreased activity tolerance, Decreased  strength, Hypomobility, Decreased balance, Difficulty walking, Impaired flexibility  Visit Diagnosis: Stiffness of left knee, not elsewhere classified  Stiffness of right knee, not elsewhere classified  Difficulty in walking, not elsewhere classified  Muscle weakness (generalized)  Unsteadiness on feet     Problem List Patient Active Problem List   Diagnosis Date Noted  . Hyperlipidemia 02/20/2016  . Essential hypertension, benign 10/10/2015  . Vitamin D deficiency 10/10/2015  . Obesity due to excess calories 10/10/2015  . HIP PAIN 01/12/2009  . SPINAL STENOSIS 01/12/2009  . LOW BACK PAIN 01/12/2009  . SPONDYLOLYSIS 01/12/2009  . SHOULDER PAIN 10/04/2008  . Type 2 diabetes mellitus, uncontrolled ([place]) 10/01/2008    Teena Irani, PTA/CLT [phone]  01/11/2017, 9:59 AM  [place] [address] [place], [place], [address] Phone: [phone]   Fax:  [phone]  Name: [name] MRN: [id number] Date of Birth: [birth year]

## 2017-01-15 ENCOUNTER — Ambulatory Visit (HOSPITAL_COMMUNITY): Payer: Medicare Other | Admitting: Physical Therapy

## 2017-01-15 DIAGNOSIS — M25662 Stiffness of left knee, not elsewhere classified: Secondary | ICD-10-CM

## 2017-01-15 DIAGNOSIS — M25661 Stiffness of right knee, not elsewhere classified: Secondary | ICD-10-CM

## 2017-01-15 DIAGNOSIS — R2681 Unsteadiness on feet: Secondary | ICD-10-CM | POA: Diagnosis not present

## 2017-01-15 DIAGNOSIS — R262 Difficulty in walking, not elsewhere classified: Secondary | ICD-10-CM | POA: Diagnosis not present

## 2017-01-15 DIAGNOSIS — M6281 Muscle weakness (generalized): Secondary | ICD-10-CM | POA: Diagnosis not present

## 2017-01-15 NOTE — Therapy (Signed)
[place], [place], [address] Phone: [phone]   Fax:  [phone]  Physical Therapy Treatment  Patient Details  Name: [name] MRN: [id number] Date of Birth: [birth year] Referring Provider: Sharilyn Sites   Encounter Date: 01/15/2017      PT End of Session - 01/15/17 1010    Visit Number 3   Number of Visits 12   Date for PT Re-Evaluation 02/13/17   Authorization Type AARP/Medicare secondary    Authorization Time Period 01/02/17 to 02/13/17   Authorization - Visit Number 3   Authorization - Number of Visits 10   PT Start Time 0955   PT Stop Time 1033   PT Time Calculation (min) 38 min   Activity Tolerance Patient tolerated treatment well;Patient limited by fatigue   Behavior During Therapy [facility] for tasks assessed/performed      Past Medical History:  Diagnosis Date  . Diabetes mellitus without complication ([place])   . Hypertension     Past Surgical History:  Procedure Laterality Date  . ABDOMINAL HYSTERECTOMY    . APPENDECTOMY    . BREAST SURGERY     begnin tumor removed    There were no vitals filed for this visit.      Subjective Assessment - 01/15/17 1055    Subjective Pt states she is still sore from the exercise routine last session.   Reports her knees aren't hurting as bad as last time at 3/10.   Currently in Pain? Yes   Pain Score 3    Pain Location Knee   Pain Orientation Right;Left   Pain Descriptors / Indicators Aching                         [facility] Adult PT Treatment/Exercise - 01/15/17 0001      Knee/Hip Exercises: Stretches   Active Hamstring Stretch Both;3 reps;20 seconds   Active Hamstring Stretch Limitations with rope   Piriformis Stretch 2 reps;30 seconds   Piriformis Stretch Limitations seated     Knee/Hip Exercises: Supine   Quad Sets Both;2 sets;10 reps   Short Arc Quad Sets Both;2 sets;10 reps   Heel Slides Both;10 reps   Bridges Both;10  reps     Knee/Hip Exercises: Sidelying   Hip ABduction Both;2 sets;10 reps     Knee/Hip Exercises: Prone   Hamstring Curl 10 reps   Other Prone Exercises heelsqueeze 10 reps 5" holds                  PT Short Term Goals - 01/02/17 1751      PT SHORT TERM GOAL #1   Title Patient to demonstrate B knee extension as being within 2 degrees of zero in order to improve mechanics and gait pattern    Time 3   Period Weeks   Status New     PT SHORT TERM GOAL #2   Title Patient to demonstrate at least a 50% improvement in bilateral hamstring and piriformis flexiblity in order to reduce pain and improve mechanics    Time 3   Period Weeks   Status New     PT SHORT TERM GOAL #3   Title Patient to demonstrate bilateral ankle DF range of at least 8-10 degrees in order to improve gait mechanics and efficiency    Time 3   Period Weeks   Status New     PT SHORT TERM GOAL #4   Title Patient  to be able to tolerate at least 20 minutes of closed chain activity with no fatigue or pain exacerbation in order to show improvement in condition    Time 3   Period Weeks   Status New     PT SHORT TERM GOAL #5   Title Patient to be independent in correctly and consistently performing HEP, to be updated PRN    Time 1   Period Weeks   Status New           PT Long Term Goals - 01/02/17 1754      PT LONG TERM GOAL #1   Title Patient to demonstrate strength as being at least 4/5 in all tested muscle groups in order to reduce pain and improve standing tolerance    Time 6   Period Weeks   Status New     PT LONG TERM GOAL #2   Title Patient to be able to ambulate at least 466ft during 3MWT in order to show improved mobility and gait efficiency    Time 6   Period Weeks   Status New     PT LONG TERM GOAL #3   Title Patient to be able to complete TUG in 13 seconds or less in order to show improved mobility and balance skills    Time 6   Period Weeks   Status New     PT LONG TERM  GOAL #4   Title Patient to be able to tolerate at least 30-40 minutes of closed chain activities with no fatigue or pain exacerbation in order to show general improvement of condition    Time 6   Period Weeks   Status New               Plan - 01/15/17 1058    Clinical Impression Statement continued with focus on strengthening Bilateral LE's.  No new exercises added this session due to ongoing soreness, however did add extra sets of quad strengthening exercises.  Pt with continued cues for form and speed of therex.     Rehab Potential Good   Clinical Impairments Affecting Rehab Potential (+) motivated to participate in skilled PT services; (-) fairly sedentary currently, chronic deficits    PT Frequency 2x / week   PT Duration 6 weeks   PT Treatment/Interventions ADLs/Self Care Home Management;Biofeedback;Cryotherapy;Moist Heat;DME Instruction;Gait training;Stair training;Functional mobility training;Therapeutic activities;Therapeutic exercise;Balance training;Neuromuscular re-education;Patient/family education;Manual techniques;Passive range of motion;Energy conservation;Taping   PT Next Visit Plan next session add slant board stretch, standing hamstring stretch, heel/toe raises adn standing hip extension.     PT Home Exercise Plan Eval: hamstring stretch, piriformis stretch, quad sets    Consulted and Agree with Plan of Care Patient      Patient will benefit from skilled therapeutic intervention in order to improve the following deficits and impairments:  Abnormal gait, Increased fascial restricitons, Improper body mechanics, Pain, Decreased coordination, Decreased mobility, Postural dysfunction, Decreased activity tolerance, Decreased strength, Hypomobility, Decreased balance, Difficulty walking, Impaired flexibility  Visit Diagnosis: Stiffness of left knee, not elsewhere classified  Stiffness of right knee, not elsewhere classified  Difficulty in walking, not elsewhere  classified  Muscle weakness (generalized)     Problem List Patient Active Problem List   Diagnosis Date Noted  . Hyperlipidemia 02/20/2016  . Essential hypertension, benign 10/10/2015  . Vitamin D deficiency 10/10/2015  . Obesity due to excess calories 10/10/2015  . HIP PAIN 01/12/2009  . SPINAL STENOSIS 01/12/2009  . LOW BACK PAIN 01/12/2009  .  SPONDYLOLYSIS 01/12/2009  . SHOULDER PAIN 10/04/2008  . Type 2 diabetes mellitus, uncontrolled ([place]) 10/01/2008    Teena Irani, PTA/CLT [phone]  01/15/2017, 11:06 AM  [facility] [address] [place], [place], [address] Phone: [phone]   Fax:  [phone]  Name: [name] MRN: [id number] Date of Birth: 1929-01-11

## 2017-01-17 ENCOUNTER — Ambulatory Visit (HOSPITAL_COMMUNITY): Payer: Medicare Other | Admitting: Physical Therapy

## 2017-01-17 DIAGNOSIS — R2681 Unsteadiness on feet: Secondary | ICD-10-CM | POA: Diagnosis not present

## 2017-01-17 DIAGNOSIS — M25661 Stiffness of right knee, not elsewhere classified: Secondary | ICD-10-CM

## 2017-01-17 DIAGNOSIS — M6281 Muscle weakness (generalized): Secondary | ICD-10-CM | POA: Diagnosis not present

## 2017-01-17 DIAGNOSIS — M25662 Stiffness of left knee, not elsewhere classified: Secondary | ICD-10-CM | POA: Diagnosis not present

## 2017-01-17 DIAGNOSIS — R262 Difficulty in walking, not elsewhere classified: Secondary | ICD-10-CM | POA: Diagnosis not present

## 2017-01-17 NOTE — Therapy (Signed)
[place], [place], [address] Phone: [phone]   Fax:  [phone]  Physical Therapy Treatment  Patient Details  Name: [name] MRN: [id number] Date of Birth: 1929-06-27 Referring Provider: Sharilyn Sites   Encounter Date: 01/17/2017      PT End of Session - 01/17/17 1431    Visit Number 4   Number of Visits 12   Date for PT Re-Evaluation 02/13/17   Authorization Type AARP/Medicare secondary    Authorization Time Period 01/02/17 to 02/13/17   Authorization - Visit Number 4   Authorization - Number of Visits 10   PT Start Time 1350   PT Stop Time 1430   PT Time Calculation (min) 40 min   Activity Tolerance Patient tolerated treatment well;Patient limited by fatigue   Behavior During Therapy [facility] for tasks assessed/performed      Past Medical History:  Diagnosis Date  . Diabetes mellitus without complication ([place])   . Hypertension     Past Surgical History:  Procedure Laterality Date  . ABDOMINAL HYSTERECTOMY    . APPENDECTOMY    . BREAST SURGERY     begnin tumor removed    There were no vitals filed for this visit.      Subjective Assessment - 01/17/17 1357    Subjective Pt states her legs are still al little sore but not as bad and no pain currently.     Currently in Pain? No/denies                         [facility] Adult PT Treatment/Exercise - 01/17/17 0001      Knee/Hip Exercises: Stretches   Active Hamstring Stretch Both;3 reps;20 seconds   Active Hamstring Stretch Limitations with rope; also instructed in standing   Piriformis Stretch --   Piriformis Stretch Limitations --   Gastroc Stretch Both;2 reps;20 seconds   Gastroc Stretch Limitations slant board standing     Knee/Hip Exercises: Standing   Heel Raises 10 reps   Heel Raises Limitations toeraises 10 reps     Knee/Hip Exercises: Supine   Quad Sets Both;20 reps   Short Arc Quad Sets Both;15 reps   Bridges Both;15 reps     Knee/Hip Exercises: Sidelying   Hip ABduction Both;15 reps     Knee/Hip Exercises: Prone   Hamstring Curl 15 reps   Hip Extension Both;10 reps                  PT Short Term Goals - 01/02/17 1751      PT SHORT TERM GOAL #1   Title Patient to demonstrate B knee extension as being within 2 degrees of zero in order to improve mechanics and gait pattern    Time 3   Period Weeks   Status New     PT SHORT TERM GOAL #2   Title Patient to demonstrate at least a 50% improvement in bilateral hamstring and piriformis flexiblity in order to reduce pain and improve mechanics    Time 3   Period Weeks   Status New     PT SHORT TERM GOAL #3   Title Patient to demonstrate bilateral ankle DF range of at least 8-10 degrees in order to improve gait mechanics and efficiency    Time 3   Period Weeks   Status New     PT SHORT TERM GOAL #4   Title Patient to be able to tolerate at  least 20 minutes of closed chain activity with no fatigue or pain exacerbation in order to show improvement in condition    Time 3   Period Weeks   Status New     PT SHORT TERM GOAL #5   Title Patient to be independent in correctly and consistently performing HEP, to be updated PRN    Time 1   Period Weeks   Status New           PT Long Term Goals - 01/02/17 1754      PT LONG TERM GOAL #1   Title Patient to demonstrate strength as being at least 4/5 in all tested muscle groups in order to reduce pain and improve standing tolerance    Time 6   Period Weeks   Status New     PT LONG TERM GOAL #2   Title Patient to be able to ambulate at least 454ft during 3MWT in order to show improved mobility and gait efficiency    Time 6   Period Weeks   Status New     PT LONG TERM GOAL #3   Title Patient to be able to complete TUG in 13 seconds or less in order to show improved mobility and balance skills    Time 6   Period Weeks   Status New     PT LONG TERM GOAL #4   Title  Patient to be able to tolerate at least 30-40 minutes of closed chain activities with no fatigue or pain exacerbation in order to show general improvement of condition    Time 6   Period Weeks   Status New               Plan - 01/17/17 1431    Clinical Impression Statement Able to increase reps this session with continued focus on B Le strengthineing.  Added standing slant board with noted difficulty due to extreme tightness in ankles.  may do better in long seated position.  Also added standing heel and toe raises, with again difficulty completing toe raises due to limited dorsi flexion.  Pt was able to complete hip extension in prone positioning this session and noted better quad contraction with QS and SAQ.     Rehab Potential Good   Clinical Impairments Affecting Rehab Potential (+) motivated to participate in skilled PT services; (-) fairly sedentary currently, chronic deficits    PT Frequency 2x / week   PT Duration 6 weeks   PT Treatment/Interventions ADLs/Self Care Home Management;Biofeedback;Cryotherapy;Moist Heat;DME Instruction;Gait training;Stair training;Functional mobility training;Therapeutic activities;Therapeutic exercise;Balance training;Neuromuscular re-education;Patient/family education;Manual techniques;Passive range of motion;Energy conservation;Taping   PT Next Visit Plan next session try long seated gastroc stretch, standing hamstring stretch,  and progressing of functional activities.   PT Home Exercise Plan Eval: hamstring stretch, piriformis stretch, quad sets    Consulted and Agree with Plan of Care Patient      Patient will benefit from skilled therapeutic intervention in order to improve the following deficits and impairments:  Abnormal gait, Increased fascial restricitons, Improper body mechanics, Pain, Decreased coordination, Decreased mobility, Postural dysfunction, Decreased activity tolerance, Decreased strength, Hypomobility, Decreased balance,  Difficulty walking, Impaired flexibility  Visit Diagnosis: Stiffness of left knee, not elsewhere classified  Stiffness of right knee, not elsewhere classified  Difficulty in walking, not elsewhere classified  Muscle weakness (generalized)  Unsteadiness on feet     Problem List Patient Active Problem List   Diagnosis Date Noted  . Hyperlipidemia 02/20/2016  . Essential  hypertension, benign 10/10/2015  . Vitamin D deficiency 10/10/2015  . Obesity due to excess calories 10/10/2015  . HIP PAIN 01/12/2009  . SPINAL STENOSIS 01/12/2009  . LOW BACK PAIN 01/12/2009  . SPONDYLOLYSIS 01/12/2009  . SHOULDER PAIN 10/04/2008  . Type 2 diabetes mellitus, uncontrolled ([place]) 10/01/2008   Teena Irani, PTA/CLT [phone]  01/17/2017, 2:34 PM  [facility] [place] [address] [place], [place], [address] Phone: [phone]   Fax:  [phone]  Name: [name] MRN: [id number] Date of Birth: [birth year]

## 2017-01-18 DIAGNOSIS — B351 Tinea unguium: Secondary | ICD-10-CM | POA: Diagnosis not present

## 2017-01-18 DIAGNOSIS — E1142 Type 2 diabetes mellitus with diabetic polyneuropathy: Secondary | ICD-10-CM | POA: Diagnosis not present

## 2017-01-22 ENCOUNTER — Ambulatory Visit (HOSPITAL_COMMUNITY): Payer: Medicare Other | Admitting: Physical Therapy

## 2017-01-22 DIAGNOSIS — M25661 Stiffness of right knee, not elsewhere classified: Secondary | ICD-10-CM

## 2017-01-22 DIAGNOSIS — M25662 Stiffness of left knee, not elsewhere classified: Secondary | ICD-10-CM

## 2017-01-22 DIAGNOSIS — R262 Difficulty in walking, not elsewhere classified: Secondary | ICD-10-CM | POA: Diagnosis not present

## 2017-01-22 DIAGNOSIS — R2681 Unsteadiness on feet: Secondary | ICD-10-CM | POA: Diagnosis not present

## 2017-01-22 DIAGNOSIS — M6281 Muscle weakness (generalized): Secondary | ICD-10-CM | POA: Diagnosis not present

## 2017-01-22 NOTE — Therapy (Signed)
[place], [place], [address] Phone: [phone]   Fax:  [phone]  Physical Therapy Treatment  Patient Details  Name: [name] MRN: [id number] Date of Birth: 06/01/1929 Referring Provider: Sharilyn Sites   Encounter Date: 01/22/2017      PT End of Session - 01/22/17 1446    Visit Number 5   Number of Visits 12   Date for PT Re-Evaluation 02/13/17   Authorization Type AARP/Medicare secondary    Authorization Time Period 01/02/17 to 02/13/17   Authorization - Visit Number 5   Authorization - Number of Visits 10   PT Start Time 1350   PT Stop Time 1430   PT Time Calculation (min) 40 min   Activity Tolerance Patient tolerated treatment well;Patient limited by fatigue   Behavior During Therapy [facility] for tasks assessed/performed      Past Medical History:  Diagnosis Date  . Diabetes mellitus without complication ([place])   . Hypertension     Past Surgical History:  Procedure Laterality Date  . ABDOMINAL HYSTERECTOMY    . APPENDECTOMY    . BREAST SURGERY     begnin tumor removed    There were no vitals filed for this visit.      Subjective Assessment - 01/22/17 1358    Subjective Pt statess she woke with more pain this morning in her knees down to her feet.  Currently 6/10.    Currently in Pain? Yes   Pain Score 6                          [facility] Adult PT Treatment/Exercise - 01/22/17 0001      Knee/Hip Exercises: Seated   Long Arc Quad Both;15 reps     Knee/Hip Exercises: Supine   Quad Sets Both;20 reps   Short Arc Quad Sets Both;20 reps   Heel Slides 15 reps;Both   Bridges Both;15 reps     Knee/Hip Exercises: Sidelying   Hip ABduction Both;15 reps     Knee/Hip Exercises: Prone   Hamstring Curl 15 reps   Hip Extension Both;15 reps                  PT Short Term Goals - 01/02/17 1751      PT SHORT TERM GOAL #1   Title Patient to demonstrate B knee  extension as being within 2 degrees of zero in order to improve mechanics and gait pattern    Time 3   Period Weeks   Status New     PT SHORT TERM GOAL #2   Title Patient to demonstrate at least a 50% improvement in bilateral hamstring and piriformis flexiblity in order to reduce pain and improve mechanics    Time 3   Period Weeks   Status New     PT SHORT TERM GOAL #3   Title Patient to demonstrate bilateral ankle DF range of at least 8-10 degrees in order to improve gait mechanics and efficiency    Time 3   Period Weeks   Status New     PT SHORT TERM GOAL #4   Title Patient to be able to tolerate at least 20 minutes of closed chain activity with no fatigue or pain exacerbation in order to show improvement in condition    Time 3   Period Weeks   Status New     PT SHORT TERM GOAL #5   Title Patient  to be independent in correctly and consistently performing HEP, to be updated PRN    Time 1   Period Weeks   Status New           PT Long Term Goals - 01/02/17 1754      PT LONG TERM GOAL #1   Title Patient to demonstrate strength as being at least 4/5 in all tested muscle groups in order to reduce pain and improve standing tolerance    Time 6   Period Weeks   Status New     PT LONG TERM GOAL #2   Title Patient to be able to ambulate at least 478ft during 3MWT in order to show improved mobility and gait efficiency    Time 6   Period Weeks   Status New     PT LONG TERM GOAL #3   Title Patient to be able to complete TUG in 13 seconds or less in order to show improved mobility and balance skills    Time 6   Period Weeks   Status New     PT LONG TERM GOAL #4   Title Patient to be able to tolerate at least 30-40 minutes of closed chain activities with no fatigue or pain exacerbation in order to show general improvement of condition    Time 6   Period Weeks   Status New               Plan - 01/22/17 1447    Clinical Impression Statement focused session in [facility]  today due to increased knee pain and discomfort.  pt thinks may be from colder, wetter weather.  Pt able to complete all mat actvities with increased reps.  Reported decreased pain several levels at end of session.     Rehab Potential Good   Clinical Impairments Affecting Rehab Potential (+) motivated to participate in skilled PT services; (-) fairly sedentary currently, chronic deficits    PT Frequency 2x / week   PT Duration 6 weeks   PT Treatment/Interventions ADLs/Self Care Home Management;Biofeedback;Cryotherapy;Moist Heat;DME Instruction;Gait training;Stair training;Functional mobility training;Therapeutic activities;Therapeutic exercise;Balance training;Neuromuscular re-education;Patient/family education;Manual techniques;Passive range of motion;Energy conservation;Taping   PT Next Visit Plan next session try long seated gastroc stretch, standing hamstring stretch,  and progressing of functional activities.   PT Home Exercise Plan Eval: hamstring stretch, piriformis stretch, quad sets    Consulted and Agree with Plan of Care Patient      Patient will benefit from skilled therapeutic intervention in order to improve the following deficits and impairments:  Abnormal gait, Increased fascial restricitons, Improper body mechanics, Pain, Decreased coordination, Decreased mobility, Postural dysfunction, Decreased activity tolerance, Decreased strength, Hypomobility, Decreased balance, Difficulty walking, Impaired flexibility  Visit Diagnosis: Stiffness of left knee, not elsewhere classified  Stiffness of right knee, not elsewhere classified  Difficulty in walking, not elsewhere classified     Problem List Patient Active Problem List   Diagnosis Date Noted  . Hyperlipidemia 02/20/2016  . Essential hypertension, benign 10/10/2015  . Vitamin D deficiency 10/10/2015  . Obesity due to excess calories 10/10/2015  . HIP PAIN 01/12/2009  . SPINAL STENOSIS 01/12/2009  . LOW BACK PAIN  01/12/2009  . SPONDYLOLYSIS 01/12/2009  . SHOULDER PAIN 10/04/2008  . Type 2 diabetes mellitus, uncontrolled ([place]) 10/01/2008    Teena Irani, PTA/CLT [phone]  01/22/2017, 2:48 PM  [facility] [address] [place], [place], [address] Phone: [phone]   Fax:  [phone]  Name: [name] MRN: [id number] Date of Birth: March 26, 1929

## 2017-01-24 ENCOUNTER — Ambulatory Visit (HOSPITAL_COMMUNITY): Payer: Medicare Other | Attending: Family Medicine | Admitting: Physical Therapy

## 2017-01-24 DIAGNOSIS — R262 Difficulty in walking, not elsewhere classified: Secondary | ICD-10-CM

## 2017-01-24 DIAGNOSIS — M25661 Stiffness of right knee, not elsewhere classified: Secondary | ICD-10-CM | POA: Diagnosis not present

## 2017-01-24 DIAGNOSIS — R2681 Unsteadiness on feet: Secondary | ICD-10-CM | POA: Diagnosis not present

## 2017-01-24 DIAGNOSIS — M6281 Muscle weakness (generalized): Secondary | ICD-10-CM

## 2017-01-24 DIAGNOSIS — M25662 Stiffness of left knee, not elsewhere classified: Secondary | ICD-10-CM | POA: Diagnosis not present

## 2017-01-24 NOTE — Therapy (Signed)
[name], [place], [address] Phone: [phone]   Fax:  [phone]  Physical Therapy Treatment  Patient Details  Name: [name] MRN: [id number] Date of Birth: 1929-09-24 Referring Provider: Sharilyn Sites   Encounter Date: 01/24/2017      PT End of Session - 01/24/17 1429    Visit Number 6   Number of Visits 12   Date for PT Re-Evaluation 02/13/17   Authorization Type AARP/Medicare secondary    Authorization Time Period 01/02/17 to 02/13/17   Authorization - Visit Number 6   Authorization - Number of Visits 10   PT Start Time [id number]   PT Stop Time 1430   PT Time Calculation (min) 38 min   Activity Tolerance Patient tolerated treatment well;Patient limited by fatigue   Behavior During Therapy [facility] for tasks assessed/performed      Past Medical History:  Diagnosis Date  . Diabetes mellitus without complication ([place])   . Hypertension     Past Surgical History:  Procedure Laterality Date  . ABDOMINAL HYSTERECTOMY    . APPENDECTOMY    . BREAST SURGERY     begnin tumor removed    There were no vitals filed for this visit.                       [facility] Adult PT Treatment/Exercise - 01/24/17 0001      Knee/Hip Exercises: Stretches   Piriformis Stretch 2 reps;30 seconds   Piriformis Stretch Limitations seated     Knee/Hip Exercises: Seated   Long Arc Quad Both;15 reps     Knee/Hip Exercises: Supine   Quad Sets Both;20 reps   Short Arc Quad Sets Both;20 reps   Heel Slides 15 reps;Both   Bridges Both;20 reps     Knee/Hip Exercises: Sidelying   Hip ABduction Both;20 reps                  PT Short Term Goals - 01/02/17 1751      PT SHORT TERM GOAL #1   Title Patient to demonstrate B knee extension as being within 2 degrees of zero in order to improve mechanics and gait pattern    Time 3   Period Weeks   Status New     PT SHORT TERM GOAL #2   Title Patient to  demonstrate at least a 50% improvement in bilateral hamstring and piriformis flexiblity in order to reduce pain and improve mechanics    Time 3   Period Weeks   Status New     PT SHORT TERM GOAL #3   Title Patient to demonstrate bilateral ankle DF range of at least 8-10 degrees in order to improve gait mechanics and efficiency    Time 3   Period Weeks   Status New     PT SHORT TERM GOAL #4   Title Patient to be able to tolerate at least 20 minutes of closed chain activity with no fatigue or pain exacerbation in order to show improvement in condition    Time 3   Period Weeks   Status New     PT SHORT TERM GOAL #5   Title Patient to be independent in correctly and consistently performing HEP, to be updated PRN    Time 1   Period Weeks   Status New           PT Long Term Goals - 01/02/17 1754  PT LONG TERM GOAL #1   Title Patient to demonstrate strength as being at least 4/5 in all tested muscle groups in order to reduce pain and improve standing tolerance    Time 6   Period Weeks   Status New     PT LONG TERM GOAL #2   Title Patient to be able to ambulate at least 468ft during 3MWT in order to show improved mobility and gait efficiency    Time 6   Period Weeks   Status New     PT LONG TERM GOAL #3   Title Patient to be able to complete TUG in 13 seconds or less in order to show improved mobility and balance skills    Time 6   Period Weeks   Status New     PT LONG TERM GOAL #4   Title Patient to be able to tolerate at least 30-40 minutes of closed chain activities with no fatigue or pain exacerbation in order to show general improvement of condition    Time 6   Period Weeks   Status New               Plan - 01/24/17 1429    Clinical Impression Statement Continued with mostly NWB exercises today with increased reps.  Overall improvement with decreased knee pain, however continued stiffness in knees bilaterally.  Pt requested updated HEP; given and  instructed in therex wtihout questions or difficulty.  No pain reported at end of session.     Rehab Potential Good   Clinical Impairments Affecting Rehab Potential (+) motivated to participate in skilled PT services; (-) fairly sedentary currently, chronic deficits    PT Frequency 2x / week   PT Duration 6 weeks   PT Treatment/Interventions ADLs/Self Care Home Management;Biofeedback;Cryotherapy;Moist Heat;DME Instruction;Gait training;Stair training;Functional mobility training;Therapeutic activities;Therapeutic exercise;Balance training;Neuromuscular re-education;Patient/family education;Manual techniques;Passive range of motion;Energy conservation;Taping   PT Next Visit Plan Continue to improve strength and ROM of bilateral knees to meet goals.  Begin functional activities when able.     PT Home Exercise Plan Eval: hamstring stretch, piriformis stretch, quad sets    Consulted and Agree with Plan of Care Patient      Patient will benefit from skilled therapeutic intervention in order to improve the following deficits and impairments:  Abnormal gait, Increased fascial restricitons, Improper body mechanics, Pain, Decreased coordination, Decreased mobility, Postural dysfunction, Decreased activity tolerance, Decreased strength, Hypomobility, Decreased balance, Difficulty walking, Impaired flexibility  Visit Diagnosis: Stiffness of left knee, not elsewhere classified  Stiffness of right knee, not elsewhere classified  Difficulty in walking, not elsewhere classified  Muscle weakness (generalized)  Unsteadiness on feet     Problem List Patient Active Problem List   Diagnosis Date Noted  . Hyperlipidemia 02/20/2016  . Essential hypertension, benign 10/10/2015  . Vitamin D deficiency 10/10/2015  . Obesity due to excess calories 10/10/2015  . HIP PAIN 01/12/2009  . SPINAL STENOSIS 01/12/2009  . LOW BACK PAIN 01/12/2009  . SPONDYLOLYSIS 01/12/2009  . SHOULDER PAIN 10/04/2008  . Type 2  diabetes mellitus, uncontrolled ([place]) 10/01/2008    Teena Irani, PTA/CLT [phone]  01/24/2017, 2:36 PM  [facility] [address] [place], [place], [address] Phone: [phone]   Fax:  [phone]  Name: [name] MRN: [id number] Date of Birth: 19-Sep-1929

## 2017-01-24 NOTE — Patient Instructions (Signed)
KNEE: Extension, Long Arc Quads - Sitting    Raise leg until knee is straight. _15__ reps per set, _2_ sets per day  Abduction: Side Leg Lift (Eccentric) - Side-Lying    Lie on side. Lift top leg slightly higher than shoulder level. Keep top leg straight with body, toes pointing forward. Slowly lower for 3-5 seconds. _10__ reps per set, __2_ sets per day   Quad Sets    Squeeze pelvic floor and hold. Tighten top of left thigh. Hold for _5__ seconds. Relax for _5__ seconds. Repeat _10__ times. Do __2_ times a day. Repeat with other leg.   Bridge    Lie back, legs bent. Inhale, pressing hips up. Keeping ribs in, lengthen lower back. Exhale, rolling down along spine from top. Repeat _10___ times. Do __2__ sessions per day.   Hamstrings    Lie on stomach  Bend same knee __90__ degrees, pointing toes toward knee. Do not bend hips.Hold _5___ seconds. Repeat _10___ times. Do _2___ sessions per day.   HIP / KNEE: Extension - Prone    Squeeze glutes. Raise leg up. Keep knee straight. _10__ reps per set, _2__ sets per day

## 2017-01-29 ENCOUNTER — Ambulatory Visit (HOSPITAL_COMMUNITY): Payer: Medicare Other

## 2017-01-29 DIAGNOSIS — R2681 Unsteadiness on feet: Secondary | ICD-10-CM

## 2017-01-29 DIAGNOSIS — M6281 Muscle weakness (generalized): Secondary | ICD-10-CM

## 2017-01-29 DIAGNOSIS — M25662 Stiffness of left knee, not elsewhere classified: Secondary | ICD-10-CM

## 2017-01-29 DIAGNOSIS — M25661 Stiffness of right knee, not elsewhere classified: Secondary | ICD-10-CM

## 2017-01-29 DIAGNOSIS — R262 Difficulty in walking, not elsewhere classified: Secondary | ICD-10-CM | POA: Diagnosis not present

## 2017-01-29 NOTE — Therapy (Signed)
[place], [place], [address] Phone: [phone]   Fax:  [phone]  Physical Therapy Treatment  Patient Details  Name: [name] MRN: [id number] Date of Birth: [birth year] Referring Provider: Sharilyn Sites   Encounter Date: 01/29/2017      PT End of Session - 01/29/17 1444    Visit Number 7   Number of Visits 12   Date for PT Re-Evaluation 02/13/17   Authorization Type AARP/Medicare secondary    Authorization Time Period 01/02/17 to 02/13/17   Authorization - Visit Number 7   Authorization - Number of Visits 10   PT Start Time [id number]   PT Stop Time [id number]   PT Time Calculation (min) 39 min   Activity Tolerance Patient tolerated treatment well;Patient limited by fatigue;No increased pain   Behavior During Therapy WFL for tasks assessed/performed      Past Medical History:  Diagnosis Date  . Diabetes mellitus without complication ([place])   . Hypertension     Past Surgical History:  Procedure Laterality Date  . ABDOMINAL HYSTERECTOMY    . APPENDECTOMY    . BREAST SURGERY     begnin tumor removed    There were no vitals filed for this visit.      Subjective Assessment - 01/29/17 1439    Subjective Pt stated pain is minimal today, current pain scale 2/10.  Reports high sugar level this morning over 300+.  Still difficutly with weight bearing tolerance.   Pertinent History on beta-blockers, HTN, DM, back pain (spondylosis and stenosis)   Patient Stated Goals get rid of cane, get rid of wheelchair    Currently in Pain? Yes   Pain Score 2    Pain Location Knee   Pain Orientation Right   Pain Descriptors / Indicators Aching   Pain Type Chronic pain   Pain Onset More than a month ago   Pain Frequency Constant   Aggravating Factors  standing   Pain Relieving Factors sitting   Effect of Pain on Daily Activities Decreased ADLs                         [facility] Adult PT  Treatment/Exercise - 01/29/17 0001      Knee/Hip Exercises: Stretches   Active Hamstring Stretch Both;3 reps;30 seconds   Active Hamstring Stretch Limitations wtih rope, therapist facilitation for appropriate form     Knee/Hip Exercises: Standing   Heel Raises 10 reps   Heel Raises Limitations toeraises 5 reps     Knee/Hip Exercises: Seated   Long Arc Quad Both;20 reps     Knee/Hip Exercises: Supine   Quad Sets Both;20 reps   Short Arc Quad Sets Both;20 reps   Heel Slides 15 reps;Both   Bridges Both;20 reps     Knee/Hip Exercises: Sidelying   Hip ABduction Both;20 reps   Hip ABduction Limitations cueing for form                  PT Short Term Goals - 01/02/17 1751      PT SHORT TERM GOAL #1   Title Patient to demonstrate B knee extension as being within 2 degrees of zero in order to improve mechanics and gait pattern    Time 3   Period Weeks   Status New     PT SHORT TERM GOAL #2   Title Patient to demonstrate at least a 50% improvement in bilateral hamstring and piriformis  flexiblity in order to reduce pain and improve mechanics    Time 3   Period Weeks   Status New     PT SHORT TERM GOAL #3   Title Patient to demonstrate bilateral ankle DF range of at least 8-10 degrees in order to improve gait mechanics and efficiency    Time 3   Period Weeks   Status New     PT SHORT TERM GOAL #4   Title Patient to be able to tolerate at least 20 minutes of closed chain activity with no fatigue or pain exacerbation in order to show improvement in condition    Time 3   Period Weeks   Status New     PT SHORT TERM GOAL #5   Title Patient to be independent in correctly and consistently performing HEP, to be updated PRN    Time 1   Period Weeks   Status New           PT Long Term Goals - 01/02/17 1754      PT LONG TERM GOAL #1   Title Patient to demonstrate strength as being at least 4/5 in all tested muscle groups in order to reduce pain and improve standing  tolerance    Time 6   Period Weeks   Status New     PT LONG TERM GOAL #2   Title Patient to be able to ambulate at least 480ft during 3MWT in order to show improved mobility and gait efficiency    Time 6   Period Weeks   Status New     PT LONG TERM GOAL #3   Title Patient to be able to complete TUG in 13 seconds or less in order to show improved mobility and balance skills    Time 6   Period Weeks   Status New     PT LONG TERM GOAL #4   Title Patient to be able to tolerate at least 30-40 minutes of closed chain activities with no fatigue or pain exacerbation in order to show general improvement of condition    Time 6   Period Weeks   Status New               Plan - 01/29/17 1524    Clinical Impression Statement Session focus on proximal strengthening with mostly NWB exercises.  Added hamstring stretch to improve knee extension and heel and toe raises for functional stretchening, did noted increased visual musculature fatigue.  Feel the muscle fatigue due to high sugar levels, pt encouraged to inform MD of high sugar levels and appropriate medication/diet per MD.  No reports of increased pain at EOS.   Rehab Potential Good   Clinical Impairments Affecting Rehab Potential (+) motivated to participate in skilled PT services; (-) fairly sedentary currently, chronic deficits    PT Frequency 2x / week   PT Duration 6 weeks   PT Treatment/Interventions ADLs/Self Care Home Management;Biofeedback;Cryotherapy;Moist Heat;DME Instruction;Gait training;Stair training;Functional mobility training;Therapeutic activities;Therapeutic exercise;Balance training;Neuromuscular re-education;Patient/family education;Manual techniques;Passive range of motion;Energy conservation;Taping   PT Next Visit Plan Continue to improve strength and ROM of bilateral knees to meet goals.  Begin functional activities when able.        Patient will benefit from skilled therapeutic intervention in order to  improve the following deficits and impairments:  Abnormal gait, Increased fascial restricitons, Improper body mechanics, Pain, Decreased coordination, Decreased mobility, Postural dysfunction, Decreased activity tolerance, Decreased strength, Hypomobility, Decreased balance, Difficulty walking, Impaired flexibility  Visit Diagnosis: Stiffness  of left knee, not elsewhere classified  Stiffness of right knee, not elsewhere classified  Difficulty in walking, not elsewhere classified  Muscle weakness (generalized)  Unsteadiness on feet     Problem List Patient Active Problem List   Diagnosis Date Noted  . Hyperlipidemia 02/20/2016  . Essential hypertension, benign 10/10/2015  . Vitamin D deficiency 10/10/2015  . Obesity due to excess calories 10/10/2015  . HIP PAIN 01/12/2009  . SPINAL STENOSIS 01/12/2009  . LOW BACK PAIN 01/12/2009  . SPONDYLOLYSIS 01/12/2009  . SHOULDER PAIN 10/04/2008  . Type 2 diabetes mellitus, uncontrolled ([place]) 10/01/2008   Ihor Austin, [place]; [place]  Aldona Lento 01/29/2017, 3:32 PM  [facility] [address] [place], [place], [address] Phone: [phone]   Fax:  [phone]  Name: [name] MRN: [id number] Date of Birth: [birth year]

## 2017-01-31 ENCOUNTER — Telehealth (HOSPITAL_COMMUNITY): Payer: Self-pay | Admitting: Family Medicine

## 2017-01-31 ENCOUNTER — Ambulatory Visit (HOSPITAL_COMMUNITY): Payer: Medicare Other

## 2017-01-31 NOTE — Telephone Encounter (Signed)
01/31/17 pt called to cx - said that she was sick today and couldn't come in

## 2017-02-05 ENCOUNTER — Ambulatory Visit (HOSPITAL_COMMUNITY): Payer: Medicare Other | Admitting: Physical Therapy

## 2017-02-05 DIAGNOSIS — M25661 Stiffness of right knee, not elsewhere classified: Secondary | ICD-10-CM

## 2017-02-05 DIAGNOSIS — R262 Difficulty in walking, not elsewhere classified: Secondary | ICD-10-CM

## 2017-02-05 DIAGNOSIS — M6281 Muscle weakness (generalized): Secondary | ICD-10-CM

## 2017-02-05 DIAGNOSIS — R2681 Unsteadiness on feet: Secondary | ICD-10-CM

## 2017-02-05 DIAGNOSIS — M25662 Stiffness of left knee, not elsewhere classified: Secondary | ICD-10-CM | POA: Diagnosis not present

## 2017-02-05 NOTE — Patient Instructions (Signed)
    KNEE EXTENSION STRETCH - PROPPED AND WEIGHTED  While seated, prop your foot up on another chair and allow gravity to stretch your knee towards a more straightened position. Place a weight such as a back pack, ankle weight or other item on the knee for an increased stretch.   Start by holding for 3-4 minutes, and add 1-2 minutes to the duration of this exercise every 3 days.

## 2017-02-05 NOTE — Therapy (Signed)
[place], [place], [address] Phone: [phone]   Fax:  [phone]  Physical Therapy Treatment (Re-Assessment)  Patient Details  Name: [name] MRN: [id number] Date of Birth: March 14, 1929 Referring Provider: Sharilyn Sites   Encounter Date: 02/05/2017      PT End of Session - 02/05/17 1449    Visit Number 8   Number of Visits 12   Date for PT Re-Evaluation 02/19/17   Authorization Type AARP/Medicare secondary (G-codes done 8th session)   Authorization Time Period 01/02/17 to 02/13/17   Authorization - Visit Number 8   Authorization - Number of Visits 18   PT Start Time 0786   PT Stop Time 1429   PT Time Calculation (min) 44 min   Activity Tolerance Patient tolerated treatment well   Behavior During Therapy [facility] for tasks assessed/performed      Past Medical History:  Diagnosis Date  . Diabetes mellitus without complication ([place])   . Hypertension     Past Surgical History:  Procedure Laterality Date  . ABDOMINAL HYSTERECTOMY    . APPENDECTOMY    . BREAST SURGERY     begnin tumor removed    There were no vitals filed for this visit.      Subjective Assessment - 02/05/17 1348    Subjective Patient reports that she has come a long way but the recent wet [place]atehr recently has not helped her knee pain. She is doing much better. Weight bearing is still difficult. She gives herself a 60%; her main concern is putting weight down through her leg and she reports she was able to stand longer this weekend.    Pertinent History on beta-blockers, HTN, DM, back pain (spondylosis and stenosis)   How long can you sit comfortably? 2/13- unlimited    How long can you stand comfortably? 2/13- did her longest time on Saturday, not sure what it is not but it is better    How long can you walk comfortably? 2/13- unlimited with cane but does have fear of falling    Patient Stated Goals get rid of cane, get rid of  wheelchair    Currently in Pain? Yes   Pain Score 2    Pain Location Knee   Pain Orientation Right   Pain Descriptors / Indicators Sore   Pain Type Chronic pain   Pain Radiating Towards none    Pain Onset More than a month ago   Pain Frequency Constant   Aggravating Factors  weight bearing    Pain Relieving Factors sitting/resting    Effect of Pain on Daily Activities cannot stand for long periods of time             [facility] PT Assessment - 02/05/17 0001      Assessment   Medical Diagnosis knee ROM, balance, gait    Referring Provider Sharilyn Sites    Onset Date/Surgical Date --  chronic    Next MD Visit not scheduled    Prior Therapy none      Balance Screen   Has the patient fallen in the past 6 months No   Has the patient had a decrease in activity level because of a fear of falling?  Yes   Is the patient reluctant to leave their home because of a fear of falling?  No     Prior Function   Level of Independence Independent;Independent with basic ADLs;Independent with gait;Independent with transfers   Vocation Retired  [employer]   Right Knee Extension 9   Left Knee Extension 10   Left Knee Flexion 120   Right Ankle Dorsiflexion 4   Left Ankle Dorsiflexion 7     Strength   Right Hip Flexion 3+/5   Right Hip ABduction 3-/5   Left Hip Flexion 3+/5   Right Knee Flexion 4+/5   Right Knee Extension 5/5   Left Knee Flexion 4+/5   Left Knee Extension 5/5   Right Ankle Dorsiflexion 5/5   Left Ankle Dorsiflexion 5/5     6 minute walk test results    Aerobic Endurance Distance Walked 159   Endurance additional comments 3MWT, SPC, one rest break      High Level Balance   High Level Balance Comments 19.00 with SPC                      [facility] Adult PT Treatment/Exercise - 02/05/17 0001      Knee/Hip Exercises: Supine   Quad Sets Both;10 reps   Quad Sets Limitations after 10 reps of knee extension overpressure   each LE                 PT  Education - 02/05/17 1448    Education provided Yes   Education Details lack of progress, POC moving forward with possible DC if there is no further progress at end of 4 more sessions; HEP    Person(s) Educated Patient   Methods Explanation;Demonstration;Handout   Comprehension Verbalized understanding;Returned demonstration;Need further instruction          PT Short Term Goals - 02/05/17 1412      PT SHORT TERM GOAL #1   Title Patient to demonstrate B knee extension as being within 2 degrees of zero in order to improve mechanics and gait pattern    Baseline 2/13- 9-10 degrees from 0 B   Time 3   Period Weeks   Status On-going     PT SHORT TERM GOAL #2   Title Patient to demonstrate at least a 50% improvement in bilateral hamstring and piriformis flexiblity in order to reduce pain and improve mechanics    Time 3   Period Weeks   Status On-going     PT SHORT TERM GOAL #3   Title Patient to demonstrate bilateral ankle DF range of at least 8-10 degrees in order to improve gait mechanics and efficiency    Time 3   Period Weeks   Status Partially Met     PT SHORT TERM GOAL #4   Title Patient to be able to tolerate at least 20 minutes of closed chain activity with no fatigue or pain exacerbation in order to show improvement in condition    Baseline 2/13- patient reports she would be unable to do this right now    Time 3   Period Weeks   Status On-going     PT SHORT TERM GOAL #5   Title Patient to be independent in correctly and consistently performing HEP, to be updated PRN    Baseline 2/13- reports compliance/is able to describe    Time 1   Period Weeks   Status Achieved           PT Long Term Goals - 02/05/17 1413      PT LONG TERM GOAL #1   Title Patient to demonstrate strength as being at least 4/5 in all tested muscle groups in order to reduce pain and improve standing tolerance      Time 6   Period Weeks   Status Partially Met     PT LONG TERM GOAL #2   Title  Patient to be able to ambulate at least 450ft during 3MWT in order to show improved mobility and gait efficiency    Baseline 2/13- 159ft during 3MWT    Time 6   Period Weeks   Status On-going     PT LONG TERM GOAL #3   Title Patient to be able to complete TUG in 13 seconds or less in order to show improved mobility and balance skills    Time 6   Period Weeks   Status On-going     PT LONG TERM GOAL #4   Title Patient to be able to tolerate at least 30-40 minutes of closed chain activities with no fatigue or pain exacerbation in order to show general improvement of condition    Baseline 2/13- unable at time of re-assess    Time 6   Period Weeks   Status On-going               Plan - 02/05/17 1712    Clinical Impression Statement Re-assessment performed today. Patient does not show significant objective improvements with testing today, however does state that she feels like she is experiencing some subjective improvement at home, including being able to stand slightly longer. She continues to demonstrate severe lack of knee extension B, which is likely contributing to gait impairment, knee pain, and reduced standing tolerance. Introduced knee extension with overpressure followed immediately by active quad sets today with mixed tolerance and quite a bit of guarding noted especially R knee. Recommend trial of just 4 more skilled sessions for increased focus on knee extension ROM and activity recommendations before possible DC moving forward.    Rehab Potential Fair   Clinical Impairments Affecting Rehab Potential (+) motivated to participate in skilled PT services; (-) fairly sedentary currently, chronic deficits, lack of significant improvement with PT thus far    PT Frequency Other (comment)  4 more sessions    PT Duration Other (comment)  4 more sessions    PT Treatment/Interventions ADLs/Self Care Home Management;Biofeedback;Cryotherapy;Moist Heat;DME Instruction;Gait  training;Stair training;Functional mobility training;Therapeutic activities;Therapeutic exercise;Balance training;Neuromuscular re-education;Patient/family education;Manual techniques;Passive range of motion;Energy conservation;Taping   PT Next Visit Plan focus on knee extension techniques, gait training; possible DC in 4 sessions    PT Home Exercise Plan Eval: hamstring stretch, piriformis stretch, quad sets    Consulted and Agree with Plan of Care Patient      Patient will benefit from skilled therapeutic intervention in order to improve the following deficits and impairments:  Abnormal gait, Increased fascial restricitons, Improper body mechanics, Pain, Decreased coordination, Decreased mobility, Postural dysfunction, Decreased activity tolerance, Decreased strength, Hypomobility, Decreased balance, Difficulty walking, Impaired flexibility  Visit Diagnosis: Stiffness of left knee, not elsewhere classified - Plan: PT plan of care cert/re-cert  Stiffness of right knee, not elsewhere classified - Plan: PT plan of care cert/re-cert  Difficulty in walking, not elsewhere classified - Plan: PT plan of care cert/re-cert  Muscle weakness (generalized) - Plan: PT plan of care cert/re-cert  Unsteadiness on feet - Plan: PT plan of care cert/re-cert       G-Codes - 02/05/17 1713    Functional Assessment Tool Used Based on skilled clinical assessment of strength, ROM, flexiblity, gait, balance, functional activity tolerance    Functional Limitation Mobility: Walking and moving around   Mobility: Walking and Moving Around Current Status ([id number]) At least   60 percent but less than 80 percent impaired, limited or restricted   Mobility: Walking and Moving Around Goal Status [id number]) At least 40 percent but less than 60 percent impaired, limited or restricted      Problem List Patient Active Problem List   Diagnosis Date Noted  . Hyperlipidemia 02/20/2016  . Essential hypertension, benign 10/10/2015   . Vitamin D deficiency 10/10/2015  . Obesity due to excess calories 10/10/2015  . HIP PAIN 01/12/2009  . SPINAL STENOSIS 01/12/2009  . LOW BACK PAIN 01/12/2009  . SPONDYLOLYSIS 01/12/2009  . SHOULDER PAIN 10/04/2008  . Type 2 diabetes mellitus, uncontrolled ([place]) 10/01/2008    Deniece Ree PT, DPT [phone]  [facility] [address] [place], [place], [address] Phone: [phone]   Fax:  [phone]  Name: [name] MRN: [id number] Date of Birth: 03-18-1929

## 2017-02-07 ENCOUNTER — Ambulatory Visit (HOSPITAL_COMMUNITY): Payer: Medicare Other | Admitting: Physical Therapy

## 2017-02-07 DIAGNOSIS — M25661 Stiffness of right knee, not elsewhere classified: Secondary | ICD-10-CM

## 2017-02-07 DIAGNOSIS — M6281 Muscle weakness (generalized): Secondary | ICD-10-CM | POA: Diagnosis not present

## 2017-02-07 DIAGNOSIS — M25662 Stiffness of left knee, not elsewhere classified: Secondary | ICD-10-CM

## 2017-02-07 DIAGNOSIS — R2681 Unsteadiness on feet: Secondary | ICD-10-CM | POA: Diagnosis not present

## 2017-02-07 DIAGNOSIS — R262 Difficulty in walking, not elsewhere classified: Secondary | ICD-10-CM | POA: Diagnosis not present

## 2017-02-07 NOTE — Therapy (Signed)
[place], [place], [address] Phone: [phone]   Fax:  [phone]  Physical Therapy Treatment  Patient Details  Name: [name] MRN: [id number] Date of Birth: August 07, 1929 Referring Provider: Sharilyn Sites   Encounter Date: 02/07/2017      PT End of Session - 02/07/17 1429    Visit Number 9   Number of Visits 12   Date for PT Re-Evaluation 02/19/17   Authorization Type AARP/Medicare secondary (G-codes done 8th session)   Authorization Time Period 01/02/17 to 02/13/17   Authorization - Visit Number 9   Authorization - Number of Visits 18   PT Start Time 6950   PT Stop Time 1426   PT Time Calculation (min) 38 min   Activity Tolerance Patient tolerated treatment well   Behavior During Therapy [facility] for tasks assessed/performed      Past Medical History:  Diagnosis Date  . Diabetes mellitus without complication ([place])   . Hypertension     Past Surgical History:  Procedure Laterality Date  . ABDOMINAL HYSTERECTOMY    . APPENDECTOMY    . BREAST SURGERY     begnin tumor removed    There were no vitals filed for this visit.      Subjective Assessment - 02/07/17 1350    Subjective Patient arrives today stating that she was a little sore after last session but it resolved with some. Nothing major going on otherwise. She has been trying the new stretch given last time.    Pertinent History on beta-blockers, HTN, DM, back pain (spondylosis and stenosis)   Currently in Pain? No/denies            [facility] PT Assessment - 02/07/17 0001      AROM   Right Knee Extension 10   Left Knee Extension 8                     [facility] Adult PT Treatment/Exercise - 02/07/17 0001      Ambulation/Gait   Ambulation/Gait Yes   Ambulation Distance (Feet) 120 Feet   Assistive device Straight cane   Gait Comments cues for use of cane on appropriate side, reciprocal pattern      Knee/Hip Exercises:  Stretches   Active Hamstring Stretch Both;3 reps;30 seconds   Active Hamstring Stretch Limitations with sheet, supine      Knee/Hip Exercises: Standing   Other Standing Knee Exercises narrow BOS on foam pad approx 20 seconds min gurad      Knee/Hip Exercises: Seated   Other Seated Knee/Hip Exercises knee extension stretch seated x2 minutes each      Knee/Hip Exercises: Supine   Quad Sets Both;20 reps   Quad Sets Limitations 5 seocnd holds    Short Arc [employer] Both;1 set;15 reps   Short Arc Quad Sets Limitations 5 second holds      Manual Therapy   Manual Therapy Joint mobilization   Manual therapy comments performed separately from all other skilled sessions    Joint Mobilization patella mobilization all directions B, knee extension overpressure 1x10 B prior to quad sets                 PT Education - 02/07/17 1429    Education provided Yes   Education Details use of cane in correct UE, recoprical pattern    Person(s) Educated Patient   Methods Explanation   Comprehension Verbalized understanding  PT Short Term Goals - 02/05/17 1412      PT SHORT TERM GOAL #1   Title Patient to demonstrate B knee extension as being within 2 degrees of zero in order to improve mechanics and gait pattern    Baseline 2/13- 9-10 degrees from 0 B   Time 3   Period Weeks   Status On-going     PT SHORT TERM GOAL #2   Title Patient to demonstrate at least a 50% improvement in bilateral hamstring and piriformis flexiblity in order to reduce pain and improve mechanics    Time 3   Period Weeks   Status On-going     PT SHORT TERM GOAL #3   Title Patient to demonstrate bilateral ankle DF range of at least 8-10 degrees in order to improve gait mechanics and efficiency    Time 3   Period Weeks   Status Partially Met     PT SHORT TERM GOAL #4   Title Patient to be able to tolerate at least 20 minutes of closed chain activity with no fatigue or pain exacerbation in order to  show improvement in condition    Baseline 2/13- patient reports she would be unable to do this right now    Time 3   Period Weeks   Status On-going     PT SHORT TERM GOAL #5   Title Patient to be independent in correctly and consistently performing HEP, to be updated PRN    Baseline 2/13- reports compliance/is able to describe    Time 1   Period Weeks   Status Achieved           PT Long Term Goals - 02/05/17 1413      PT LONG TERM GOAL #1   Title Patient to demonstrate strength as being at least 4/5 in all tested muscle groups in order to reduce pain and improve standing tolerance    Time 6   Period Weeks   Status Partially Met     PT LONG TERM GOAL #2   Title Patient to be able to ambulate at least 478f during 3MWT in order to show improved mobility and gait efficiency    Baseline 2/13- 1562fduring 3MWT    Time 6   Period Weeks   Status On-going     PT LONG TERM GOAL #3   Title Patient to be able to complete TUG in 13 seconds or less in order to show improved mobility and balance skills    Time 6   Period Weeks   Status On-going     PT LONG TERM GOAL #4   Title Patient to be able to tolerate at least 30-40 minutes of closed chain activities with no fatigue or pain exacerbation in order to show general improvement of condition    Baseline 2/13- unable at time of re-assess    Time 6   Period Weeks   Status On-going               Plan - 02/07/17 1430    Clinical Impression Statement Focused on functional stretching and focused on knee extension ROM today as well as patella mobilizations and knee extension overpressure techniques. Able to achieve a couple more degrees of knee extension L but not R however will continue to closely monitor and work on this aspect of care moving forward. Patient continues to display reluctance to participate in CKC exercises, stating at one point "I don't think I can do this, I'm shaking".  Continue to anticipate possible DC in 3  more sessions if no significant progress is made.    Rehab Potential Fair   Clinical Impairments Affecting Rehab Potential (+) motivated to participate in skilled PT services; (-) fairly sedentary currently, chronic deficits, lack of significant improvement with PT thus far    PT Frequency Other (comment)  3 more sessions    PT Duration Other (comment)  3 more sessions    PT Treatment/Interventions ADLs/Self Care Home Management;Biofeedback;Cryotherapy;Moist Heat;DME Instruction;Gait training;Stair training;Functional mobility training;Therapeutic activities;Therapeutic exercise;Balance training;Neuromuscular re-education;Patient/family education;Manual techniques;Passive range of motion;Energy conservation;Taping   PT Next Visit Plan focus on knee extension techniques, gait training; possible DC in 3 sessions    Consulted and Agree with Plan of Care Patient      Patient will benefit from skilled therapeutic intervention in order to improve the following deficits and impairments:  Abnormal gait, Increased fascial restricitons, Improper body mechanics, Pain, Decreased coordination, Decreased mobility, Postural dysfunction, Decreased activity tolerance, Decreased strength, Hypomobility, Decreased balance, Difficulty walking, Impaired flexibility  Visit Diagnosis: Stiffness of left knee, not elsewhere classified  Stiffness of right knee, not elsewhere classified  Difficulty in walking, not elsewhere classified  Muscle weakness (generalized)     Problem List Patient Active Problem List   Diagnosis Date Noted  . Hyperlipidemia 02/20/2016  . Essential hypertension, benign 10/10/2015  . Vitamin D deficiency 10/10/2015  . Obesity due to excess calories 10/10/2015  . HIP PAIN 01/12/2009  . SPINAL STENOSIS 01/12/2009  . LOW BACK PAIN 01/12/2009  . SPONDYLOLYSIS 01/12/2009  . SHOULDER PAIN 10/04/2008  . Type 2 diabetes mellitus, uncontrolled ([place]) 10/01/2008    Deniece Ree PT,  DPT [phone]  [place] [address] [place], [place], [address] Phone: [phone]   Fax:  [phone]  Name: [name] MRN: [id number] Date of Birth: October 07, 1929

## 2017-02-12 ENCOUNTER — Ambulatory Visit (HOSPITAL_COMMUNITY): Payer: Medicare Other

## 2017-02-12 DIAGNOSIS — M6281 Muscle weakness (generalized): Secondary | ICD-10-CM

## 2017-02-12 DIAGNOSIS — R2681 Unsteadiness on feet: Secondary | ICD-10-CM

## 2017-02-12 DIAGNOSIS — M25662 Stiffness of left knee, not elsewhere classified: Secondary | ICD-10-CM | POA: Diagnosis not present

## 2017-02-12 DIAGNOSIS — M25661 Stiffness of right knee, not elsewhere classified: Secondary | ICD-10-CM

## 2017-02-12 DIAGNOSIS — R262 Difficulty in walking, not elsewhere classified: Secondary | ICD-10-CM | POA: Diagnosis not present

## 2017-02-12 NOTE — Therapy (Signed)
[place], [place], [address] Phone: [phone]   Fax:  [phone]  Physical Therapy Treatment  Patient Details  Name: [name] MRN: [id number] Date of Birth: Apr 26, 1929 Referring Provider: Sharilyn Sites   Encounter Date: 02/12/2017      PT End of Session - 02/12/17 1405    Visit Number 10   Number of Visits 12   Date for PT Re-Evaluation 02/19/17   Authorization Type AARP/Medicare secondary (G-codes done 8th session)   Authorization Time Period 01/02/17 to 02/13/17   Authorization - Visit Number 10   Authorization - Number of Visits 18   PT Start Time 1350   PT Stop Time 1428   PT Time Calculation (min) 38 min   Activity Tolerance Patient tolerated treatment well;Patient limited by fatigue   Behavior During Therapy [facility] for tasks assessed/performed      Past Medical History:  Diagnosis Date  . Diabetes mellitus without complication ([place])   . Hypertension     Past Surgical History:  Procedure Laterality Date  . ABDOMINAL HYSTERECTOMY    . APPENDECTOMY    . BREAST SURGERY     begnin tumor removed    There were no vitals filed for this visit.      Subjective Assessment - 02/12/17 1353    Subjective Pt stated knee is coming along, continues to have some soreness, pain scale 2-3/10.   Pertinent History on beta-blockers, HTN, DM, back pain (spondylosis and stenosis)   Patient Stated Goals get rid of cane, get rid of wheelchair    Currently in Pain? Yes   Pain Score 3    Pain Location Knee   Pain Orientation Right   Pain Descriptors / Indicators Sore   Pain Type Chronic pain   Pain Radiating Towards none   Pain Onset More than a month ago   Pain Frequency Constant   Aggravating Factors  weight bearing    Pain Relieving Factors sitting/resting   Effect of Pain on Daily Activities cannot stand for long periods of time              [facility] Adult PT Treatment/Exercise - 02/12/17  0001      Knee/Hip Exercises: Standing   Heel Raises 10 reps   Heel Raises Limitations toeraises 10 reps   Rocker Board Limitations attempted; max cueing for form reports "I'm getting shaky, and requested to step down"   Other Standing Knee Exercises NBOS on [employer] 4x 30"   Other Standing Knee Exercises tandem stance 3x 20" solid surface (Rt foot behind to improve extension)     Knee/Hip Exercises: Supine   Quad Sets Both;20 reps   Short Arc Quad Sets Both;1 set;15 reps   Short Arc [employer] Limitations 5" holds   Bridges Both;20 reps   Knee Extension Limitations 5 degrees lacking     Manual Therapy   Manual Therapy Joint mobilization   Manual therapy comments performed separately from all other skilled sessions    Joint Mobilization patella mobilization all directions B, knee extension overpressure 1x10 B prior to quad sets                   PT Short Term Goals - 02/05/17 1412      PT SHORT TERM GOAL #1   Title Patient to demonstrate B knee extension as being within 2 degrees of zero in order to improve mechanics and gait pattern    Baseline 2/13- 9-10  degrees from 0 B   Time 3   Period Weeks   Status On-going     PT SHORT TERM GOAL #2   Title Patient to demonstrate at least a 50% improvement in bilateral hamstring and piriformis flexiblity in order to reduce pain and improve mechanics    Time 3   Period Weeks   Status On-going     PT SHORT TERM GOAL #3   Title Patient to demonstrate bilateral ankle DF range of at least 8-10 degrees in order to improve gait mechanics and efficiency    Time 3   Period Weeks   Status Partially Met     PT SHORT TERM GOAL #4   Title Patient to be able to tolerate at least 20 minutes of closed chain activity with no fatigue or pain exacerbation in order to show improvement in condition    Baseline 2/13- patient reports she would be unable to do this right now    Time 3   Period Weeks   Status On-going     PT SHORT TERM GOAL #5    Title Patient to be independent in correctly and consistently performing HEP, to be updated PRN    Baseline 2/13- reports compliance/is able to describe    Time 1   Period Weeks   Status Achieved           PT Long Term Goals - 02/05/17 1413      PT LONG TERM GOAL #1   Title Patient to demonstrate strength as being at least 4/5 in all tested muscle groups in order to reduce pain and improve standing tolerance    Time 6   Period Weeks   Status Partially Met     PT LONG TERM GOAL #2   Title Patient to be able to ambulate at least 462f during 3MWT in order to show improved mobility and gait efficiency    Baseline 2/13- 1550fduring 3MWT    Time 6   Period Weeks   Status On-going     PT LONG TERM GOAL #3   Title Patient to be able to complete TUG in 13 seconds or less in order to show improved mobility and balance skills    Time 6   Period Weeks   Status On-going     PT LONG TERM GOAL #4   Title Patient to be able to tolerate at least 30-40 minutes of closed chain activities with no fatigue or pain exacerbation in order to show general improvement of condition    Baseline 2/13- unable at time of re-assess    Time 6   Period Weeks   Status On-going               Plan - 02/12/17 1428    Clinical Impression Statement Session focus on improing knee mobilty especially with extension and improving activity tolerance with standing for functional strengthening.  Current lacking 8 degrees extensin with Rt knee.  Added balance training to improve standing tolerance and reduce risk of fall.  Pt continues to be limited by fatigue with several seated rest breaks required through session and somewhat reluctant to participate with CKC exercises stating "I don't think I can do this, I'm shaking".     Rehab Potential Fair   Clinical Impairments Affecting Rehab Potential (+) motivated to participate in skilled PT services; (-) fairly sedentary currently, chronic deficits, lack of  significant improvement with PT thus far    PT Frequency --  2 more  sessions   PT Duration --  2 more sessions   PT Treatment/Interventions ADLs/Self Care Home Management;Biofeedback;Cryotherapy;Moist Heat;DME Instruction;Gait training;Stair training;Functional mobility training;Therapeutic activities;Therapeutic exercise;Balance training;Neuromuscular re-education;Patient/family education;Manual techniques;Passive range of motion;Energy conservation;Taping   PT Next Visit Plan focus on knee extension techniques, gait training; possible DC in 2 sessions       Patient will benefit from skilled therapeutic intervention in order to improve the following deficits and impairments:  Abnormal gait, Increased fascial restricitons, Improper body mechanics, Pain, Decreased coordination, Decreased mobility, Postural dysfunction, Decreased activity tolerance, Decreased strength, Hypomobility, Decreased balance, Difficulty walking, Impaired flexibility  Visit Diagnosis: Stiffness of left knee, not elsewhere classified  Stiffness of right knee, not elsewhere classified  Difficulty in walking, not elsewhere classified  Muscle weakness (generalized)  Unsteadiness on feet     Problem List Patient Active Problem List   Diagnosis Date Noted  . Hyperlipidemia 02/20/2016  . Essential hypertension, benign 10/10/2015  . Vitamin D deficiency 10/10/2015  . Obesity due to excess calories 10/10/2015  . HIP PAIN 01/12/2009  . SPINAL STENOSIS 01/12/2009  . LOW BACK PAIN 01/12/2009  . SPONDYLOLYSIS 01/12/2009  . SHOULDER PAIN 10/04/2008  . Type 2 diabetes mellitus, uncontrolled ([place]) 10/01/2008   Ihor Austin, [place]; [place]  Aldona Lento 02/12/2017, 2:43 PM  [place] [address] [place], [place], [address] Phone: [phone]   Fax:  [phone]  Name: [name] MRN: [id number] Date of Birth: 20-Sep-1929

## 2017-02-12 NOTE — Patient Instructions (Addendum)
Toe / Heel Raise (Standing)    Standing with support, raise heels, then rock back on heels and raise toes.  Make sure to stand tall! Repeat 10  times.  Copyright  VHI. All rights reserved.   Narrow base of support  Standing tall with feet touching close to counter top.  Try not to hold on.  Be safe with activity!

## 2017-02-19 ENCOUNTER — Ambulatory Visit (HOSPITAL_COMMUNITY): Payer: Medicare Other

## 2017-02-19 ENCOUNTER — Telehealth (HOSPITAL_COMMUNITY): Payer: Self-pay | Admitting: Family Medicine

## 2017-02-19 NOTE — Telephone Encounter (Signed)
02/19/17 she has an appt in [place] and doesn't think she will be back in time for her appt here this afternoon .  She said that when she comes in Thursday she will reschedule

## 2017-02-21 ENCOUNTER — Ambulatory Visit (HOSPITAL_COMMUNITY): Payer: Medicare Other | Attending: Family Medicine | Admitting: Physical Therapy

## 2017-02-21 DIAGNOSIS — R2681 Unsteadiness on feet: Secondary | ICD-10-CM

## 2017-02-21 DIAGNOSIS — M6281 Muscle weakness (generalized): Secondary | ICD-10-CM

## 2017-02-21 DIAGNOSIS — R262 Difficulty in walking, not elsewhere classified: Secondary | ICD-10-CM

## 2017-02-21 DIAGNOSIS — M25662 Stiffness of left knee, not elsewhere classified: Secondary | ICD-10-CM

## 2017-02-21 DIAGNOSIS — M25661 Stiffness of right knee, not elsewhere classified: Secondary | ICD-10-CM

## 2017-02-21 NOTE — Therapy (Signed)
[place], [place], [address] Phone: [phone]   Fax:  [phone]  Physical Therapy Treatment (Discharge)  Patient Details  Name: [name] MRN: [id number] Date of Birth: [birth year] Referring Provider: Sharilyn Sites   Encounter Date: 02/21/2017      PT End of Session - 02/21/17 1435    Visit Number 11   Number of Visits 11   Authorization Type AARP/Medicare secondary (G-codes done 11th session)   Authorization Time Period 9/75/30 to 0/51/10; recert done 3/1   Authorization - Visit Number 11   Authorization - Number of Visits 21   PT Start Time 1350   PT Stop Time 2111  DC today, further skilled PT services for knees not appropriate    PT Time Calculation (min) 25 min   Activity Tolerance Patient tolerated treatment well;Patient limited by fatigue   Behavior During Therapy [facility] for tasks assessed/performed      Past Medical History:  Diagnosis Date  . Diabetes mellitus without complication ([place])   . Hypertension     Past Surgical History:  Procedure Laterality Date  . ABDOMINAL HYSTERECTOMY    . APPENDECTOMY    . BREAST SURGERY     begnin tumor removed    There were no vitals filed for this visit.      Subjective Assessment - 02/21/17 1352    Subjective Patient arrives stating that things seem to be going OK and she feels like they are going better than they were. No falls or close calls recently. Nothing else major going on.    Pertinent History on beta-blockers, HTN, DM, back pain (spondylosis and stenosis)   How long can you sit comfortably? 3/1- unlimited    How long can you stand comfortably? 3/1- has not measured    How long can you walk comfortably? 3/1-    Patient Stated Goals get rid of cane, get rid of wheelchair    Currently in Pain? Yes   Pain Score 2    Pain Location Knee   Pain Orientation Right   Pain Descriptors / Indicators Sore   Pain Type Chronic pain   Pain  Radiating Towards none    Pain Onset More than a month ago   Pain Frequency Constant   Aggravating Factors  weight bearing    Pain Relieving Factors sitting/resting    Effect of Pain on Daily Activities not anything if she has her cane             [facility] PT Assessment - 02/21/17 0001      Assessment   Medical Diagnosis knee ROM, balance, gait    Referring Provider Sharilyn Sites    Onset Date/Surgical Date --  chronic    Next MD Visit Dr. Hilma Favors on the 20th    Prior Therapy none      Balance Screen   Has the patient fallen in the past 6 months No   Has the patient had a decrease in activity level because of a fear of falling?  Yes   Is the patient reluctant to leave their home because of a fear of falling?  No     Prior Function   Level of Independence Independent;Independent with basic ADLs;Independent with gait;Independent with transfers   Vocation Retired     AROM   Right Knee Extension 10   Right Knee Flexion 121   Left Knee Extension 10   Left Knee Flexion 125     Strength  Right Hip Flexion 3+/5   Right Hip ABduction 3-/5   Left Hip Flexion 3+/5   Left Hip ABduction 4/5   Right Knee Flexion 5/5   Right Knee Extension 5/5   Left Knee Flexion 4+/5   Left Knee Extension 5/5   Right Ankle Dorsiflexion 5/5   Left Ankle Dorsiflexion 5/5     6 minute walk test results    Aerobic Endurance Distance Walked 226   Endurance additional comments 3MWT, 1 rest break, SPC      High Level Balance   High Level Balance Comments TUG 15.9 with SPC                             PT Education - 02/21/17 1434    Education provided Yes   Education Details Progress with skilled PT services, DC today for knees due to generally limited progress on this front; activity recommendations at [employer]; we will submit referral for balance PT to MD    Person(s) Educated Patient   Methods Explanation   Comprehension Verbalized understanding          PT  Short Term Goals - 02/21/17 1410      PT SHORT TERM GOAL #1   Title Patient to demonstrate B knee extension as being within 2 degrees of zero in order to improve mechanics and gait pattern    Baseline 3/1- -10 B    Time 3   Period Weeks   Status On-going     PT SHORT TERM GOAL #2   Title Patient to demonstrate at least a 50% improvement in bilateral hamstring and piriformis flexiblity in order to reduce pain and improve mechanics    Baseline 3/1- DNT    Time 3   Period Weeks   Status Deferred     PT SHORT TERM GOAL #3   Title Patient to demonstrate bilateral ankle DF range of at least 8-10 degrees in order to improve gait mechanics and efficiency    Time 3   Period Weeks   Status Partially Met     PT SHORT TERM GOAL #4   Title Patient to be able to tolerate at least 20 minutes of closed chain activity with no fatigue or pain exacerbation in order to show improvement in condition    Baseline 3/1- unable    Time 3   Period Weeks   Status On-going     PT SHORT TERM GOAL #5   Title Patient to be independent in correctly and consistently performing HEP, to be updated PRN    Baseline 3/1- going well    Time 1   Period Weeks   Status Achieved           PT Long Term Goals - 02/21/17 1411      PT LONG TERM GOAL #1   Title Patient to demonstrate strength as being at least 4/5 in all tested muscle groups in order to reduce pain and improve standing tolerance    Time 6   Period Weeks   Status Partially Met     PT LONG TERM GOAL #2   Title Patient to be able to ambulate at least 412f during 3MWT in order to show improved mobility and gait efficiency    Baseline 3/1- 225    Time 6   Period Weeks   Status On-going     PT LONG TERM GOAL #3   Title Patient to be able  to complete TUG in 13 seconds or less in order to show improved mobility and balance skills    Baseline 3/1- 15-16 with SPC    Time 6   Period Weeks   Status On-going     PT LONG TERM GOAL #4   Title  Patient to be able to tolerate at least 30-40 minutes of closed chain activities with no fatigue or pain exacerbation in order to show general improvement of condition    Baseline 3/1- unable    Time 6   Period Weeks   Status On-going               Plan - 03/04/2017 1436    Clinical Impression Statement Re-assessment performed today. Patient arrives reporting that she is feeling better in general, although she still fatigues quite easily with standing activities at this time. She does demonstrate improved scores on 3MWT as well as TUG balance test, however knee ROM and strength does not demonstrate any significant change. She has not made significant progress towards her knee oriented goals at this point, however does state that fear of falling and balance is a big concern for her. Due to apparent lack of progress with knee oriented treatments, recommend DC from skilled PT services today, however will plan to send MD a new order for balance focus moving forward.    Rehab Potential Fair   Clinical Impairments Affecting Rehab Potential (+) motivated to participate in skilled PT services; (-) fairly sedentary currently, chronic deficits, lack of significant improvement with PT thus far    PT Next Visit Plan DC today    Consulted and Agree with Plan of Care Patient      Patient will benefit from skilled therapeutic intervention in order to improve the following deficits and impairments:  Abnormal gait, Increased fascial restricitons, Improper body mechanics, Pain, Decreased coordination, Decreased mobility, Postural dysfunction, Decreased activity tolerance, Decreased strength, Hypomobility, Decreased balance, Difficulty walking, Impaired flexibility  Visit Diagnosis: Stiffness of left knee, not elsewhere classified  Stiffness of right knee, not elsewhere classified  Difficulty in walking, not elsewhere classified  Muscle weakness (generalized)  Unsteadiness on feet       G-Codes -  Mar 04, 2017 1436    Functional Assessment Tool Used (Outpatient Only) Based on skilled clinical assessment of strength, ROM, flexiblity, gait, balance, functional activity tolerance    Functional Limitation Mobility: Walking and moving around   Mobility: Walking and Moving Around Goal Status [phone]) At least 40 percent but less than 60 percent impaired, limited or restricted   Mobility: Walking and Moving Around Discharge Status [phone]) At least 40 percent but less than 60 percent impaired, limited or restricted      Problem List Patient Active Problem List   Diagnosis Date Noted  . Hyperlipidemia 02/20/2016  . Essential hypertension, benign 10/10/2015  . Vitamin D deficiency 10/10/2015  . Obesity due to excess calories 10/10/2015  . HIP PAIN 01/12/2009  . SPINAL STENOSIS 01/12/2009  . LOW BACK PAIN 01/12/2009  . SPONDYLOLYSIS 01/12/2009  . SHOULDER PAIN 10/04/2008  . Type 2 diabetes mellitus, uncontrolled ([place]) 10/01/2008    PHYSICAL THERAPY DISCHARGE SUMMARY  Visits from Start of Care: 11  Current functional level related to goals / functional outcomes: Patient has somewhat improved balance and gait speed/tolerance however otherwise has not made significant improvements towards objective improvements or goals. Recommend DC today with transition to balance focused PT evaluation after MD gives approval of this plan.    Remaining deficits: No significant  changes since initial eval    Education / Equipment: Progress with skilled PT services, DC today for knees due to generally limited progress on this front; [facility] recommendations [facility] [employer]; we will submit referral for balance PT to MD    Plan: Patient agrees to discharge.  Patient goals were not met. Patient is being discharged due to lack of progress.  ?????      Deniece Ree PT, DPT [place] [address] [place], [place], [address] Phone:  [phone]   Fax:  [phone]  Name: [name] MRN: [id number] Date of Birth: [birth year]

## 2017-03-07 DIAGNOSIS — H61001 Unspecified perichondritis of right external ear: Secondary | ICD-10-CM | POA: Diagnosis not present

## 2017-03-07 DIAGNOSIS — L821 Other seborrheic keratosis: Secondary | ICD-10-CM | POA: Diagnosis not present

## 2017-03-11 ENCOUNTER — Ambulatory Visit (HOSPITAL_COMMUNITY): Payer: Medicare Other

## 2017-03-11 DIAGNOSIS — M25662 Stiffness of left knee, not elsewhere classified: Secondary | ICD-10-CM | POA: Diagnosis not present

## 2017-03-11 DIAGNOSIS — M25661 Stiffness of right knee, not elsewhere classified: Secondary | ICD-10-CM | POA: Diagnosis not present

## 2017-03-11 DIAGNOSIS — M6281 Muscle weakness (generalized): Secondary | ICD-10-CM | POA: Diagnosis not present

## 2017-03-11 DIAGNOSIS — R2681 Unsteadiness on feet: Secondary | ICD-10-CM | POA: Diagnosis not present

## 2017-03-11 DIAGNOSIS — R262 Difficulty in walking, not elsewhere classified: Secondary | ICD-10-CM | POA: Diagnosis not present

## 2017-03-11 NOTE — Therapy (Signed)
[place], [place], [address] Phone: [phone]   Fax:  [phone]  Physical Therapy Evaluation  Patient Details  Name: [name] MRN: [id number] Date of Birth: Aug 27, 1929 Referring Provider: Sharilyn Sites, MD  Encounter Date: 03/11/2017      PT End of Session - 03/11/17 1357    Visit Number 1   Number of Visits 9   Date for PT Re-Evaluation 04/08/17   Authorization Type Medicare Part A and B (AARP secondary)   Authorization Time Period 03/11/2017 to 04/08/2017   Authorization - Visit Number 1   Authorization - Number of Visits 10   PT Start Time 0932   PT Stop Time 1432   PT Time Calculation (min) 29 min   Equipment Utilized During Treatment Gait belt   Activity Tolerance Patient tolerated treatment well;Patient limited by fatigue   Behavior During Therapy [facility] for tasks assessed/performed      Past Medical History:  Diagnosis Date  . Diabetes mellitus without complication ([place])   . Hypertension     Past Surgical History:  Procedure Laterality Date  . ABDOMINAL HYSTERECTOMY    . APPENDECTOMY    . BREAST SURGERY     begnin tumor removed    There were no vitals filed for this visit.       Subjective Assessment - 03/11/17 1405    Subjective Pt reports having PT here over a few weeks ago for knee pain, but she was discharged due to lack of progress. She states she is back here for balance but she "didn't know anything about this" and did not know what PT could do for balance She denies any falls in the last 6 months, but does report that after she stands for long periods of time her legs get shakey. Pt is diabetc and has peripheral neuropathy - she is unable to determine if this shakiness is her blood sugar dropping or generalized weakness (pt has h/o DM).   Pertinent History on beta-blockers, HTN, DM, back pain (spondylosis and stenosis)   Limitations Walking;Standing   How long can you sit  comfortably? no issues   How long can you stand comfortably? not very long, 10 mins or less   How long can you walk comfortably? last time she tested it was during 3MWT during discharge   Patient Stated Goals to get stronger, stand longer   Currently in Pain? No/denies            [facility] PT Assessment - 03/11/17 0001      Assessment   Medical Diagnosis gait training/fall prevention/balance training   Referring Provider Sharilyn Sites, MD   Next MD Visit 03/12/2017   Prior Therapy yes for knee ROM and strengthening     Precautions   Precautions Fall     Observation/Other Assessments   Focus on Therapeutic Outcomes (FOTO)  62%     Strength   Right Hip Flexion 4-/5   Right Hip ABduction 4/5  in sitting   Right Hip ADduction 5/5  in sitting   Left Hip Flexion 4-/5   Left Hip ABduction 4/5  in sitting   Left Hip ADduction 5/5  in sitting   Right Knee Flexion 5/5   Right Knee Extension 5/5   Left Knee Flexion 4+/5   Left Knee Extension 5/5   Right Ankle Dorsiflexion 5/5   Left Ankle Dorsiflexion 5/5     Ambulation/Gait   Assistive device Straight cane   Gait  Comments increased bil knee flexion, bil trendelenberg, decreased hip ext, decreased ankle DF throughout gait assessment with [facility]     Balance   Balance Assessed Yes     Static Standing Balance   Static Standing - Balance Support Bilateral upper extremity supported   Static Standing - Level of Assistance 3: Mod assist   Static Standing Balance -  Activities  Single Leg Stance - Right Leg;Single Leg Stance - Left Leg   Static Standing - Comment/# of Minutes 6 sec BLE with bil HHA from PT providing mod assistance     Standardized Balance Assessment   Standardized Balance Assessment Dynamic Gait Index     Dynamic Gait Index   Level Surface Mild Impairment   Change in Gait Speed Mild Impairment   Gait with Horizontal Head Turns Mild Impairment   Gait with Vertical Head Turns Mild Impairment   Gait and Pivot Turn  Mild Impairment   Step Over Obstacle Mild Impairment   Step Around Obstacles Mild Impairment   Steps Mild Impairment   Total Score 16   DGI comment: pt required 3 seated rest breaks during testing               PT Education - 03/11/17 1401    Education provided Yes   Education Details exam findings, POC, will initiate HEP next session   Person(s) Educated Patient   Methods Explanation   Comprehension Verbalized understanding          PT Short Term Goals - 03/11/17 1626      PT SHORT TERM GOAL #1   Title Pt will be independent with HEP and perform consistently in order to maximize overall function and decrease risk for falls.    Time 2   Period Weeks   Status New     PT SHORT TERM GOAL #2   Title Pt's TUG time will be <15 sec with LRAD in order to demonstrate improved overall balance and function and decrease risk for falls.   Time 2   Period Weeks   Status New           PT Long Term Goals - 03/11/17 1628      PT LONG TERM GOAL #1   Title Pt will be able to perform SLS on BLE for 30 seconds or greater with no UE support in order to maximize gait, and demonstrate improved overall strength, balance, and function.   Time 4   Period Weeks   Status New     PT LONG TERM GOAL #2   Title Pt will have 5xSTS time of 15 seconds or less in order to demonstrate improved overall strength and function and decrease risk for falls.   Time 4   Period Weeks   Status New     PT LONG TERM GOAL #3   Title Pt will report being able to stand for 20 mins or greater to demonstrate improved overall strength, balance, and maximize function at home and in the community.   Time 4   Period Weeks   Status New               Plan - 03/11/17 1400    Clinical Impression Statement Evaluation limited as pt arrived late to treatment session. Pt is a pleasant 83 YO F who presents to therapy with c/o fear of falling when walking and difficulty standing. Pt initially unsure of what  PT could do for balance issues and seemed as though she was confused  on why she was referred to PT. However, pt has significant deficits in gait, balance, deficits in BLE strength, and overall function. Pt scored 16/24 on DGI indicating she is at increased risk for falling, and she was only able to perform SLS for 6 sec on BLE with mod assist from PT. Pt's MMT slightly impaired but based off of her walking and overall performance during today's session, pt demonstrates significant functional weakness. She was also limited by fatigue as she required multiple rest breaks during the DGI. Pt needs skilled PT intervention to maximize balance, strength, and decrease risk of falls.   Rehab Potential Fair   PT Frequency 2x / week   PT Duration 4 weeks   PT Treatment/Interventions ADLs/Self Care Home Management;DME Instruction;Gait training;Stair training;Functional mobility training;Therapeutic activities;Therapeutic exercise;Balance training;Neuromuscular re-education;Patient/family education;Manual techniques;Passive range of motion;Taping;Vestibular   PT Next Visit Plan review initial eval and goals, TUG and 5xSTS, initiate balance training, functional BLE strengthening, gait training    PT Home Exercise Plan initiate next session due to time restrictions during eval   Consulted and Agree with Plan of Care Patient      Patient will benefit from skilled therapeutic intervention in order to improve the following deficits and impairments:  Abnormal gait, Decreased activity tolerance, Decreased balance, Decreased endurance, Decreased mobility, Decreased strength, Difficulty walking, Impaired sensation  Visit Diagnosis: Difficulty in walking, not elsewhere classified - Plan: PT plan of care cert/re-cert  Unsteadiness on feet - Plan: PT plan of care cert/re-cert  Muscle weakness (generalized) - Plan: PT plan of care cert/re-cert      G-Codes - 64/68/03 1636    Functional Assessment Tool Used (Outpatient  Only) FOTO, clinical judgment, MMT, DGI   Functional Limitation Mobility: Walking and moving around   Mobility: Walking and Moving Around Current Status [phone]) At least 60 percent but less than 80 percent impaired, limited or restricted   Mobility: Walking and Moving Around Goal Status [phone]) At least 20 percent but less than 40 percent impaired, limited or restricted       Problem List Patient Active Problem List   Diagnosis Date Noted  . Hyperlipidemia 02/20/2016  . Essential hypertension, benign 10/10/2015  . Vitamin D deficiency 10/10/2015  . Obesity due to excess calories 10/10/2015  . HIP PAIN 01/12/2009  . SPINAL STENOSIS 01/12/2009  . LOW BACK PAIN 01/12/2009  . SPONDYLOLYSIS 01/12/2009  . SHOULDER PAIN 10/04/2008  . Type 2 diabetes mellitus, uncontrolled ([place]) 10/01/2008      Geraldine Solar PT, [place] [address] [place], [place], [address] Phone: [phone]   Fax:  [phone]  Name: KAZARIA GAERTNER MRN: [id number] Date of Birth: 12/09/1929

## 2017-03-12 DIAGNOSIS — M1991 Primary osteoarthritis, unspecified site: Secondary | ICD-10-CM | POA: Diagnosis not present

## 2017-03-12 DIAGNOSIS — E782 Mixed hyperlipidemia: Secondary | ICD-10-CM | POA: Diagnosis not present

## 2017-03-12 DIAGNOSIS — Z1389 Encounter for screening for other disorder: Secondary | ICD-10-CM | POA: Diagnosis not present

## 2017-03-12 DIAGNOSIS — Z6830 Body mass index (BMI) 30.0-30.9, adult: Secondary | ICD-10-CM | POA: Diagnosis not present

## 2017-03-12 DIAGNOSIS — E1165 Type 2 diabetes mellitus with hyperglycemia: Secondary | ICD-10-CM | POA: Diagnosis not present

## 2017-03-12 DIAGNOSIS — E6609 Other obesity due to excess calories: Secondary | ICD-10-CM | POA: Diagnosis not present

## 2017-03-12 DIAGNOSIS — I10 Essential (primary) hypertension: Secondary | ICD-10-CM | POA: Diagnosis not present

## 2017-03-12 LAB — HEMOGLOBIN A1C: Hemoglobin A1C: 10.4

## 2017-03-14 ENCOUNTER — Ambulatory Visit (HOSPITAL_COMMUNITY): Payer: Medicare Other | Admitting: Physical Therapy

## 2017-03-14 DIAGNOSIS — M6281 Muscle weakness (generalized): Secondary | ICD-10-CM

## 2017-03-14 DIAGNOSIS — R262 Difficulty in walking, not elsewhere classified: Secondary | ICD-10-CM

## 2017-03-14 DIAGNOSIS — M25662 Stiffness of left knee, not elsewhere classified: Secondary | ICD-10-CM | POA: Diagnosis not present

## 2017-03-14 DIAGNOSIS — R2681 Unsteadiness on feet: Secondary | ICD-10-CM

## 2017-03-14 DIAGNOSIS — M25661 Stiffness of right knee, not elsewhere classified: Secondary | ICD-10-CM | POA: Diagnosis not present

## 2017-03-14 NOTE — Therapy (Signed)
[place], [place], [address] Phone: [phone]   Fax:  [phone]  Physical Therapy Treatment  Patient Details  Name: [name] MRN: [id number] Date of Birth: 05-17-1929 Referring Provider: Sharilyn Sites, MD  Encounter Date: 03/14/2017      PT End of Session - 03/14/17 1729    Visit Number 2   Number of Visits 9   Date for PT Re-Evaluation 04/08/17   Authorization Type Medicare Part A and B ([employer] secondary)   Authorization Time Period 03/11/2017 to 04/08/2017   Authorization - Visit Number 2   Authorization - Number of Visits 10   PT Start Time 4818   PT Stop Time 1726   PT Time Calculation (min) 40 min   Equipment Utilized During Treatment Gait belt   Activity Tolerance Patient tolerated treatment well;Patient limited by fatigue   Behavior During Therapy [facility] for tasks assessed/performed      Past Medical History:  Diagnosis Date  . Diabetes mellitus without complication ([place])   . Hypertension     Past Surgical History:  Procedure Laterality Date  . ABDOMINAL HYSTERECTOMY    . APPENDECTOMY    . BREAST SURGERY     begnin tumor removed    There were no vitals filed for this visit.      Subjective Assessment - 03/14/17 1651    Subjective Pt reports that things are going well. She has been performing her HEP and doing her taxes today. No complaints.    Pertinent History on beta-blockers, HTN, DM, back pain (spondylosis and stenosis)   Limitations Walking;Standing   How long can you sit comfortably? no issues   How long can you stand comfortably? not very long, 10 mins or less   How long can you walk comfortably? last time she tested it was during 3MWT during discharge   Patient Stated Goals to get stronger, stand longer   Currently in Pain? No/denies            [facility] PT Assessment - 03/14/17 0001      Transfers   Five time sit to stand comments  15.6 sec, UE on thighs and  excessive hip ADD     High Level Balance   High Level Balance Comments TUG: 17.3 sec, SPC                     [facility] Adult PT Treatment/Exercise - 03/14/17 0001      Knee/Hip Exercises: Standing   Heel Raises Both;1 set;10 reps   Knee Flexion Both;2 sets;10 reps   Knee Flexion Limitations rest break in between sets    Other Standing Knee Exercises side stepping Lt/Rt in // bars x10 reps each              Balance Exercises - 03/14/17 1657      Balance Exercises: Standing   Standing Eyes Opened Narrow base of support (BOS);2 reps;30 secs;Foam/compliant surface   Standing Eyes Closed Narrow base of support (BOS);Solid surface;3 reps;20 secs   Tandem Stance 2 reps;Eyes open;20 secs   SLS Eyes open;2 reps;10 secs;Solid surface  LE propped on bolster            PT Education - 03/14/17 1728    Education provided Yes   Education Details addition to HEP; importance of improving her standing/walking tolerance   Person(s) Educated Patient   Methods Explanation;Handout   Comprehension Verbalized understanding;Returned demonstration  PT Short Term Goals - 03/11/17 1626      PT SHORT TERM GOAL #1   Title Pt will be independent with HEP and perform consistently in order to maximize overall function and decrease risk for falls.    Time 2   Period Weeks   Status New     PT SHORT TERM GOAL #2   Title Pt's TUG time will be <15 sec with LRAD in order to demonstrate improved overall balance and function and decrease risk for falls.   Time 2   Period Weeks   Status New           PT Long Term Goals - 03/11/17 1628      PT LONG TERM GOAL #1   Title Pt will be able to perform SLS on BLE for 30 seconds or greater with no UE support in order to maximize gait, and demonstrate improved overall strength, balance, and function.   Time 4   Period Weeks   Status New     PT LONG TERM GOAL #2   Title Pt will have 5xSTS time of 15 seconds or less in order to  demonstrate improved overall strength and function and decrease risk for falls.   Time 4   Period Weeks   Status New     PT LONG TERM GOAL #3   Title Pt will report being able to stand for 20 mins or greater to demonstrate improved overall strength, balance, and maximize function at home and in the community.   Time 4   Period Weeks   Status New               Plan - 03/14/17 1730    Clinical Impression Statement Today's session began with functional testing remaining from her evaluation. Pt demonstrated decreased performance on the 5x sit to stand compared to when it was last measured at discharge. She also performed poorly on the TUG. Remainder of the session focused on standing static balance activity. Pt demonstrates heavy reliance on vision during balance, evident by increased unsteadiness and shaking when instructed to perform activities with her eyes closed. She also demonstrated poor endurance and standing activity tolerance as she required rest breaks after every activity. Ended without report of pain. Will continue with current POC.    Rehab Potential Fair   Clinical Impairments Affecting Rehab Potential (+) motivated to participate in skilled PT services; (-) fairly sedentary currently, chronic deficits, lack of significant improvement with PT thus far    PT Frequency 2x / week   PT Duration 4 weeks   PT Treatment/Interventions ADLs/Self Care Home Management;DME Instruction;Gait training;Stair training;Functional mobility training;Therapeutic activities;Therapeutic exercise;Balance training;Neuromuscular re-education;Patient/family education;Manual techniques;Passive range of motion;Taping;Vestibular   PT Next Visit Plan follow up with pt concerning HEP she is performing for her knees and update if needed; static balance activity; endurance training    PT Home Exercise Plan standing NBOS with eyes open 4x30 sec holds at kitchen counter. Daily walking    Consulted and Agree with  Plan of Care Patient      Patient will benefit from skilled therapeutic intervention in order to improve the following deficits and impairments:  Abnormal gait, Decreased activity tolerance, Decreased balance, Decreased endurance, Decreased mobility, Decreased strength, Difficulty walking, Impaired sensation  Visit Diagnosis: Difficulty in walking, not elsewhere classified  Unsteadiness on feet  Muscle weakness (generalized)     Problem List Patient Active Problem List   Diagnosis Date Noted  . Hyperlipidemia 02/20/2016  .  Essential hypertension, benign 10/10/2015  . Vitamin D deficiency 10/10/2015  . Obesity due to excess calories 10/10/2015  . HIP PAIN 01/12/2009  . SPINAL STENOSIS 01/12/2009  . LOW BACK PAIN 01/12/2009  . SPONDYLOLYSIS 01/12/2009  . SHOULDER PAIN 10/04/2008  . Type 2 diabetes mellitus, uncontrolled ([place]) 10/01/2008    6:06 PM,03/14/17 Elly Modena PT, DPT [facility] [address] [place], [place], [address] Phone: [phone]   Fax:  [phone]  Name: [name] MRN: [id number] Date of Birth: [birth year]

## 2017-03-18 ENCOUNTER — Encounter (HOSPITAL_COMMUNITY): Payer: Self-pay

## 2017-03-18 ENCOUNTER — Ambulatory Visit (HOSPITAL_COMMUNITY): Payer: Medicare Other

## 2017-03-18 DIAGNOSIS — R262 Difficulty in walking, not elsewhere classified: Secondary | ICD-10-CM

## 2017-03-18 DIAGNOSIS — M25662 Stiffness of left knee, not elsewhere classified: Secondary | ICD-10-CM | POA: Diagnosis not present

## 2017-03-18 DIAGNOSIS — M6281 Muscle weakness (generalized): Secondary | ICD-10-CM

## 2017-03-18 DIAGNOSIS — R2681 Unsteadiness on feet: Secondary | ICD-10-CM | POA: Diagnosis not present

## 2017-03-18 DIAGNOSIS — M25661 Stiffness of right knee, not elsewhere classified: Secondary | ICD-10-CM | POA: Diagnosis not present

## 2017-03-18 NOTE — Therapy (Signed)
[place], [place], [address] Phone: [phone]   Fax:  [phone]  Physical Therapy Treatment  Patient Details  Name: [name] MRN: [id number] Date of Birth: 07-Apr-1929 Referring Provider: Sharilyn Sites, MD  Encounter Date: 03/18/2017      PT End of Session - 03/18/17 1305    Visit Number 3   Number of Visits 9   Date for PT Re-Evaluation 04/08/17   Authorization Type Medicare Part A and B ([employer] secondary)   Authorization Time Period 03/11/2017 to 04/08/2017   Authorization - Visit Number 3   Authorization - Number of Visits 10   PT Start Time 1302   PT Stop Time 1343   PT Time Calculation (min) 41 min   Equipment Utilized During Treatment Gait belt   Activity Tolerance Patient tolerated treatment well;Patient limited by fatigue   Behavior During Therapy [facility] for tasks assessed/performed      Past Medical History:  Diagnosis Date  . Diabetes mellitus without complication ([place])   . Hypertension     Past Surgical History:  Procedure Laterality Date  . ABDOMINAL HYSTERECTOMY    . APPENDECTOMY    . BREAST SURGERY     begnin tumor removed    There were no vitals filed for this visit.      Subjective Assessment - 03/18/17 1304    Subjective Pt states she feels alright. She denies any falls or close calls since last treatment session.   Pertinent History on beta-blockers, HTN, DM, back pain (spondylosis and stenosis)   Limitations Walking;Standing   How long can you sit comfortably? no issues   How long can you stand comfortably? not very long, 10 mins or less   How long can you walk comfortably? last time she tested it was during 3MWT during discharge   Patient Stated Goals to get stronger, stand longer   Currently in Pain? No/denies            [facility] Adult PT Treatment/Exercise - 03/18/17 0001      Knee/Hip Exercises: Standing   Heel Raises Both;1 set;15 reps  toe raises as  well, 1 set x 15 reps   Heel Raises Limitations BUE support throughout; increased difficulty with toe raises   Other Standing Knee Exercises bil side stepping with RTB in // bars x 3 laps     Knee/Hip Exercises: Seated   Sit to Sand 1 set;10 reps  hands on thighs; fatigued at end of set     Knee/Hip Exercises: Supine   Bridges 3 sets;10 reps     Knee/Hip Exercises: Sidelying   Clams 2x10 BLE, GTB          Balance Exercises - 03/18/17 1328      Balance Exercises: Standing   Tandem Stance Eyes open;Intermittent upper extremity support;4 reps;10 secs  increased BLE fatigue   SLS Eyes open;Intermittent upper extremity support;5 reps  x 5 sec   Other Standing Exercises attempted fwd tandem gait but pt only able to take 2 steps and stated "I'm not gonna be able to do this" and wished to do another activity           PT Education - 03/18/17 1555    Education provided Yes   Education Details stand up tall during exercises as pt continued to squat down/sink down almost when performing balance activities; updated HEP   Person(s) Educated Patient   Methods Explanation;Handout   Comprehension Verbalized understanding;Need further instruction  PT Short Term Goals - 03/11/17 1626      PT SHORT TERM GOAL #1   Title Pt will be independent with HEP and perform consistently in order to maximize overall function and decrease risk for falls.    Time 2   Period Weeks   Status New     PT SHORT TERM GOAL #2   Title Pt's TUG time will be <15 sec with LRAD in order to demonstrate improved overall balance and function and decrease risk for falls.   Time 2   Period Weeks   Status New           PT Long Term Goals - 03/11/17 1628      PT LONG TERM GOAL #1   Title Pt will be able to perform SLS on BLE for 30 seconds or greater with no UE support in order to maximize gait, and demonstrate improved overall strength, balance, and function.   Time 4   Period Weeks   Status  New     PT LONG TERM GOAL #2   Title Pt will have 5xSTS time of 15 seconds or less in order to demonstrate improved overall strength and function and decrease risk for falls.   Time 4   Period Weeks   Status New     PT LONG TERM GOAL #3   Title Pt will report being able to stand for 20 mins or greater to demonstrate improved overall strength, balance, and maximize function at home and in the community.   Time 4   Period Weeks   Status New               Plan - 03/18/17 1556    Clinical Impression Statement Session limited due to pt fatigue and pt requiring increased time for rest breaks in between exercises. Pt tolerated therex well but had a lot of difficulty with balance activites; she frequently needed cueing to stand up tall as she continued to let her knees and hips flex during each set of exercise. Pt also became fatigued quickly with minimal amounts of exercise. Continue POC as planned and progress as pt tolerates.   Rehab Potential Fair   Clinical Impairments Affecting Rehab Potential (+) motivated to participate in skilled PT services; (-) fairly sedentary currently, chronic deficits, lack of significant improvement with PT thus far    PT Frequency 2x / week   PT Duration 4 weeks   PT Treatment/Interventions ADLs/Self Care Home Management;DME Instruction;Gait training;Stair training;Functional mobility training;Therapeutic activities;Therapeutic exercise;Balance training;Neuromuscular re-education;Patient/family education;Manual techniques;Passive range of motion;Taping;Vestibular   PT Next Visit Plan follow up with pt concerning HEP she is performing for her knees and update if needed; static balance activity; endurance training; Nustep, continue SLS and tandem balance, tandem gait, retro gait, step ups, continue sit <> stands,    PT Home Exercise Plan standing NBOS with eyes open 4x30 sec holds at kitchen counter. Daily walking; 3/26: bridging, sidelying clams with GTB    Consulted and Agree with Plan of Care Patient      Patient will benefit from skilled therapeutic intervention in order to improve the following deficits and impairments:  Abnormal gait, Decreased activity tolerance, Decreased balance, Decreased endurance, Decreased mobility, Decreased strength, Difficulty walking, Impaired sensation  Visit Diagnosis: Difficulty in walking, not elsewhere classified  Unsteadiness on feet  Muscle weakness (generalized)     Problem List Patient Active Problem List   Diagnosis Date Noted  . Hyperlipidemia 02/20/2016  . Essential hypertension, benign 10/10/2015  .  Vitamin D deficiency 10/10/2015  . Obesity due to excess calories 10/10/2015  . HIP PAIN 01/12/2009  . SPINAL STENOSIS 01/12/2009  . LOW BACK PAIN 01/12/2009  . SPONDYLOLYSIS 01/12/2009  . SHOULDER PAIN 10/04/2008  . Type 2 diabetes mellitus, uncontrolled ([place]) 10/01/2008      Geraldine Solar PT, [place] [address] [place], [place], [address] Phone: [phone]   Fax:  [phone]  Name: [name] MRN: [id number] Date of Birth: 02/13/1929

## 2017-03-18 NOTE — Patient Instructions (Signed)
  BRIDGING  While lying on your back, tighten your lower abdominals, squeeze your buttocks and then raise your buttocks off the floor/bed as creating a "Bridge" with your body. Hold and then lower yourself and repeat.   ELASTIC BAND - SIDELYING CLAM-   While lying on your side with your knees bent and an elastic band wrapped around your knees, draw up the top knee while keeping contact of your feet together as shown.   Do not let your pelvis roll back during the lifting movement.      Perform each 1x/day, 3 sets of 10 each.

## 2017-03-20 ENCOUNTER — Telehealth (HOSPITAL_COMMUNITY): Payer: Self-pay | Admitting: Family Medicine

## 2017-03-20 ENCOUNTER — Ambulatory Visit (HOSPITAL_COMMUNITY): Payer: Medicare Other

## 2017-03-20 NOTE — Telephone Encounter (Signed)
03/20/17 pt cx because she was sick with a cold

## 2017-03-25 ENCOUNTER — Ambulatory Visit (HOSPITAL_COMMUNITY): Payer: Medicare Other

## 2017-03-25 ENCOUNTER — Telehealth (HOSPITAL_COMMUNITY): Payer: Self-pay | Admitting: Family Medicine

## 2017-03-25 NOTE — Telephone Encounter (Signed)
03/25/17 husband left a message to cancel appt because she has a migraine

## 2017-03-26 ENCOUNTER — Telehealth (HOSPITAL_COMMUNITY): Payer: Self-pay | Admitting: Family Medicine

## 2017-03-26 NOTE — Telephone Encounter (Signed)
03/26/17 patient called to ask if we would cancel all appts until she sees Dr. Aline Brochure on April 20

## 2017-03-27 ENCOUNTER — Ambulatory Visit (HOSPITAL_COMMUNITY): Payer: Medicare Other

## 2017-03-31 ENCOUNTER — Other Ambulatory Visit: Payer: Self-pay | Admitting: "Endocrinology

## 2017-04-01 ENCOUNTER — Ambulatory Visit (HOSPITAL_COMMUNITY): Payer: Medicare Other

## 2017-04-03 ENCOUNTER — Ambulatory Visit (HOSPITAL_COMMUNITY): Payer: PRIVATE HEALTH INSURANCE

## 2017-04-05 DIAGNOSIS — B351 Tinea unguium: Secondary | ICD-10-CM | POA: Diagnosis not present

## 2017-04-05 DIAGNOSIS — E1142 Type 2 diabetes mellitus with diabetic polyneuropathy: Secondary | ICD-10-CM | POA: Diagnosis not present

## 2017-04-08 ENCOUNTER — Encounter (HOSPITAL_COMMUNITY): Payer: PRIVATE HEALTH INSURANCE

## 2017-04-10 ENCOUNTER — Encounter (HOSPITAL_COMMUNITY): Payer: PRIVATE HEALTH INSURANCE

## 2017-04-12 ENCOUNTER — Encounter: Payer: Self-pay | Admitting: Orthopedic Surgery

## 2017-04-12 ENCOUNTER — Ambulatory Visit (INDEPENDENT_AMBULATORY_CARE_PROVIDER_SITE_OTHER): Payer: Medicare Other | Admitting: Orthopedic Surgery

## 2017-04-12 ENCOUNTER — Ambulatory Visit (INDEPENDENT_AMBULATORY_CARE_PROVIDER_SITE_OTHER): Payer: Medicare Other

## 2017-04-12 VITALS — BP 115/51 | HR 61 | Ht 65.0 in | Wt 199.0 lb

## 2017-04-12 DIAGNOSIS — G8929 Other chronic pain: Secondary | ICD-10-CM

## 2017-04-12 DIAGNOSIS — M1711 Unilateral primary osteoarthritis, right knee: Secondary | ICD-10-CM

## 2017-04-12 DIAGNOSIS — M25561 Pain in right knee: Secondary | ICD-10-CM | POA: Diagnosis not present

## 2017-04-12 NOTE — Progress Notes (Signed)
Patient ID: [name], female   DOB: 20-Nov-1929, 83 y.o.   MRN: [id number]  Chief Complaint  Patient presents with  . Knee Pain    Right knee pain, no injury.    HPI AROUSH [name] is a 83 y.o. female.  She is 83 years old she is an established patient she comes in with a 2-3 month history of severe pain on the medial aspect of her right knee which has not been relieved by physical therapy. She went to [facility] and did not get any relief. She does complain of some swelling and decreased range of motion. She is currently ambulating with a cane  Review of Systems Review of Systems  Respiratory: Negative for shortness of breath.   Cardiovascular: Negative for chest pain.   (2 MINIMUM)  Past Medical History:  Diagnosis Date  . Diabetes mellitus without complication ([place])   . Hypertension     Past Surgical History:  Procedure Laterality Date  . ABDOMINAL HYSTERECTOMY    . APPENDECTOMY    . BREAST SURGERY     begnin tumor removed    Social History Social History  Substance Use Topics  . Smoking status: Never Smoker  . Smokeless tobacco: Never Used  . Alcohol use No    Allergies  Allergen Reactions  . Codeine Nausea And Vomiting    No outpatient prescriptions have been marked as taking for the 04/12/17 encounter (Office Visit) with Carole Civil, MD.      Physical Exam Physical Exam 1.BP (!) 115/51   Pulse 61   Ht 5\' 5"  (1.651 m)   Wt 199 lb (90.3 kg)   BMI 33.12 kg/m   2. Gen. appearance. The patient is well-developed and well-nourished, grooming and hygiene are normal. There are no gross congenital abnormalities  3. The patient is alert and oriented to person place and time  4. Mood and affect are normal  5. Ambulation *supported by a cane    Examination reveals the following: 6. On inspection we find tenderness over the medial joint line without effusion  7. With the range of motion of  125 of flexion 5-10 flexion  contracture  8. Stability tests were normal  in all ligaments  9. Strength tests revealed grade 5 motor strength  10. Skin we find no rash ulceration or erythema  11. Sensation remains intact  12 Vascular system shows no peripheral edema  Left lower extremity strength was normal  MEDICAL DECISION MAKING:    Data Reviewed Regular plain films show degenerative arthritis all 3 compartments  Assessment Encounter Diagnoses  Name Primary?  . Chronic pain of right knee Yes  . Primary osteoarthritis of right knee      Plan She does not want to have surgery and she opted for injection of cortisone  Procedure note right knee injection verbal consent was obtained to inject right knee joint  Timeout was completed to confirm the site of injection  The medications used were 40 mg of Depo-Medrol and 1% lidocaine 3 cc  Anesthesia was provided by ethyl chloride and the skin was prepped with alcohol.  After cleaning the skin with alcohol a 20-gauge needle was used to inject the right knee joint. There were no complications. A sterile bandage was applied.   FU 3 MONTHS   Arther Abbott 04/12/2017, 10:33 AM

## 2017-04-12 NOTE — Patient Instructions (Signed)

## 2017-04-15 ENCOUNTER — Encounter: Payer: Self-pay | Admitting: "Endocrinology

## 2017-04-15 ENCOUNTER — Ambulatory Visit (INDEPENDENT_AMBULATORY_CARE_PROVIDER_SITE_OTHER): Payer: Medicare Other | Admitting: "Endocrinology

## 2017-04-15 VITALS — BP 132/70 | HR 67 | Ht 65.0 in | Wt 210.0 lb

## 2017-04-15 DIAGNOSIS — IMO0002 Reserved for concepts with insufficient information to code with codable children: Secondary | ICD-10-CM

## 2017-04-15 DIAGNOSIS — E118 Type 2 diabetes mellitus with unspecified complications: Secondary | ICD-10-CM | POA: Diagnosis not present

## 2017-04-15 DIAGNOSIS — E6609 Other obesity due to excess calories: Secondary | ICD-10-CM

## 2017-04-15 DIAGNOSIS — I10 Essential (primary) hypertension: Secondary | ICD-10-CM

## 2017-04-15 DIAGNOSIS — E782 Mixed hyperlipidemia: Secondary | ICD-10-CM

## 2017-04-15 DIAGNOSIS — Z6830 Body mass index (BMI) 30.0-30.9, adult: Secondary | ICD-10-CM | POA: Diagnosis not present

## 2017-04-15 DIAGNOSIS — E1165 Type 2 diabetes mellitus with hyperglycemia: Secondary | ICD-10-CM

## 2017-04-15 MED ORDER — METFORMIN HCL 500 MG PO TABS: 500.0000 mg | ORAL_TABLET | Freq: Two times a day (BID) | ORAL | 2 refills | Status: DC

## 2017-04-15 MED ORDER — VITAMIN D3 125 MCG (5000 UT) PO CAPS: 5000.0000 [IU] | ORAL_CAPSULE | Freq: Every day | ORAL | 0 refills | Status: DC

## 2017-04-15 MED ORDER — SITAGLIPTIN PHOSPHATE 50 MG PO TABS: 50.0000 mg | ORAL_TABLET | Freq: Every day | ORAL | 3 refills | Status: DC

## 2017-04-15 NOTE — Patient Instructions (Signed)

## 2017-04-15 NOTE — Progress Notes (Signed)
Subjective:    Patient ID: [name], female    DOB: February 10, 1929,    Past Medical History:  Diagnosis Date  . Diabetes mellitus without complication ([place])   . Hypertension    Past Surgical History:  Procedure Laterality Date  . ABDOMINAL HYSTERECTOMY    . APPENDECTOMY    . BREAST SURGERY     begnin tumor removed   Social History   Social History  . Marital status: Married    Spouse name: N/A  . Number of children: N/A  . Years of education: N/A   Social History Main Topics  . Smoking status: Never Smoker  . Smokeless tobacco: Never Used  . Alcohol use No  . Drug use: No  . Sexual activity: Not Asked   Other Topics Concern  . None   Social History Narrative  . None   Outpatient Encounter Prescriptions as of 04/15/2017  Medication Sig  . ALPRAZolam (XANAX) 1 MG tablet Take 1 mg by mouth daily.   . furosemide (LASIX) 20 MG tablet Take 20 mg by mouth daily as needed (for fluid retention).   . gabapentin (NEURONTIN) 300 MG capsule Take 1 capsule (300 mg total) by mouth 3 (three) times daily.  [name] glimepiride (AMARYL) 2 MG tablet Take 1 tablet (2 mg total) by mouth daily with breakfast.  . lisinopril (PRINIVIL,ZESTRIL) 20 MG tablet Take 20 mg by mouth 2 (two) times daily.  . metFORMIN (GLUCOPHAGE) 500 MG tablet Take 1 tablet (500 mg total) by mouth 2 (two) times daily with a meal.  . metoprolol succinate (TOPROL-XL) 50 MG 24 hr tablet Take 50 mg by mouth daily. Take with or immediately following a meal.  . MYRBETRIQ 50 MG TB24 tablet Take 50 mg by mouth daily.  Glory Rosebush VERIO test strip USE ONE STRIP TO CHECK GLUCOSE 4 TIMES DAILY  . simvastatin (ZOCOR) 20 MG tablet Take 20 mg by mouth every evening.  . sitaGLIPtin (JANUVIA) 50 MG tablet Take 1 tablet (50 mg total) by mouth daily.  [name] tiZANidine (ZANAFLEX) 2 MG tablet Take by mouth every 6 (six) hours as needed for muscle spasms.  . traMADol (ULTRAM) 50 MG tablet Take 1 tablet (50 mg total) by mouth every 6  (six) hours as needed.  . Vitamin D, Ergocalciferol, (DRISDOL) 50000 units CAPS capsule Take 1 capsule (50,000 Units total) by mouth once a week.  . zolpidem (AMBIEN) 10 MG tablet Take 10 mg by mouth at bedtime as needed for sleep.  . [DISCONTINUED] Ibuprofen-Famotidine (DUEXIS) 800-26.6 MG TABS Take by mouth 2 (two) times daily.  . [DISCONTINUED] metFORMIN (GLUCOPHAGE) 1000 MG tablet Take 1 tablet (1,000 mg total) by mouth 2 (two) times daily.  . [DISCONTINUED] nystatin-triamcinolone (MYCOLOG II) cream Apply 1 application topically 2 (two) times daily.  . [DISCONTINUED] ondansetron (ZOFRAN) 4 MG tablet Take 1 tablet (4 mg total) by mouth every 8 (eight) hours as needed for nausea or vomiting.  . [DISCONTINUED] sitaGLIPtin (JANUVIA) 100 MG tablet Take 1 tablet (100 mg total) by mouth daily.   No facility-administered encounter medications on file as of 04/15/2017.    ALLERGIES: Allergies  Allergen Reactions  . Codeine Nausea And Vomiting   VACCINATION STATUS:  There is no immunization history on file for this patient.  Diabetes  She presents for her follow-up diabetic visit. She has type 2 diabetes mellitus. Onset time: She was diagnosed at approximate age of 17 years. Her disease course has been worsening. There are no hypoglycemic  associated symptoms. Pertinent negatives for hypoglycemia include no confusion, headaches, pallor or seizures. Associated symptoms include fatigue. Pertinent negatives for diabetes include no chest pain, no polydipsia, no polyphagia and no polyuria. There are no hypoglycemic complications. Symptoms are worsening. There are no diabetic complications. Risk factors for coronary artery disease include dyslipidemia, diabetes mellitus and hypertension. Current diabetic treatment includes oral agent (dual therapy). Her weight is increasing steadily. She is following a generally unhealthy diet. She has had a previous visit with a dietitian. Her overall blood glucose range is  140-180 mg/dl. (She monitored 12 times in the last 30 days average 159.) An ACE inhibitor/angiotensin II receptor blocker is being taken.  Hyperlipidemia  This is a chronic problem. The current episode started more than 1 year ago. Pertinent negatives include no chest pain, myalgias or shortness of breath. Current antihyperlipidemic treatment includes statins. Risk factors for coronary artery disease include diabetes mellitus, dyslipidemia and hypertension.  Hypertension  This is a chronic problem. The current episode started more than 1 year ago. The problem is controlled. Pertinent negatives include no chest pain, headaches, palpitations or shortness of breath. Past treatments include ACE inhibitors. The current treatment provides moderate improvement.     Review of Systems  Constitutional: Positive for fatigue. Negative for unexpected weight change.  HENT: Negative for trouble swallowing and voice change.   Eyes: Negative for visual disturbance.  Respiratory: Negative for cough, shortness of breath and wheezing.   Cardiovascular: Negative for chest pain, palpitations and leg swelling.  Gastrointestinal: Negative for diarrhea, nausea and vomiting.  Endocrine: Negative for cold intolerance, heat intolerance, polydipsia, polyphagia and polyuria.  Musculoskeletal: Positive for gait problem. Negative for arthralgias and myalgias.  Skin: Negative for color change, pallor, rash and wound.  Neurological: Negative for seizures and headaches.  Psychiatric/Behavioral: Negative for confusion and suicidal ideas.    Objective:    BP 132/70   Pulse 67   Ht 5\' 5"  (1.651 m)   Wt 210 lb (95.3 kg)   BMI 34.95 kg/m   Wt Readings from Last 3 Encounters:  04/15/17 210 lb (95.3 kg)  04/12/17 199 lb (90.3 kg)  09/06/16 203 lb 9.6 oz (92.4 kg)    Physical Exam  Constitutional: She is oriented to person, place, and time. She appears well-developed.  HENT:  Head: Normocephalic and atraumatic.  Eyes:  EOM are normal.  Neck: Normal range of motion. Neck supple. No tracheal deviation present. No thyromegaly present.  Cardiovascular: Normal rate and regular rhythm.   Pulmonary/Chest: Effort normal and breath sounds normal.  Abdominal: Soft. Bowel sounds are normal. There is no tenderness. There is no guarding.  Musculoskeletal: Normal range of motion. She exhibits no edema.  Neurological: She is alert and oriented to person, place, and time. She has normal reflexes. No cranial nerve deficit. Coordination normal.  Skin: Skin is warm and dry. No rash noted. No erythema. No pallor.  Psychiatric: She has a normal mood and affect. Judgment normal.    Results for orders placed or performed in visit on 04/15/17  Hemoglobin A1c  Result Value Ref Range   Hemoglobin A1C 10.4    Diabetic Labs (most recent): Lab Results  Component Value Date   HGBA1C 10.4 03/12/2017   HGBA1C 10.0 (H) 08/14/2016   HGBA1C 7.3 (H) 02/13/2016     Assessment & Plan:   1. Uncontrolled type 2 diabetes mellitus with CKD as a complication, without long-term current use of insulin [facility])  Patient came with  Higher A1c of 10.4%  increasing from 10% last visit . She missed her appointments since September 2017. - Remarkably, she denies polydipsia/polyuria.  Recent labs reviewed. - Patient remains at a high risk for more acute and chronic complications of diabetes which include CAD, CVA, CKD, retinopathy, and neuropathy. These are all discussed in detail with the patient.  - I have re-counseled the patient on diet management and weight loss  by adopting a carbohydrate restricted / protein rich  Diet. - Patient is advised to stick to a routine mealtimes to eat 3 meals  a day and avoid unnecessary snacks ( to snack only to correct hypoglycemia).  - Suggestion is made for patient to avoid simple carbohydrates   from their diet including Cakes , Desserts, Ice Cream,  Soda (  diet and regular) , Sweet Tea , Candies,  Chips,  Cookies, Artificial Sweeteners,   and "Sugar-free" Products .  This will help patient to have stable blood glucose profile and potentially avoid unintended  Weight gain.  - The patient  has been  scheduled with Jearld Fenton, RDN, CDE for individualized DM education. - I have approached patient with the following individualized plan to manage diabetes and patient agrees.   - She has lost control of her diabetes with A1c of 10.4%  - She still would like to avoid insulin treatment, although she will soon need it. - She is willing to start monitoring blood glucose 4 times a day-before meals and at bedtime and return in 1 week for reevaluation. - She will be reapproached for insulin treatment if her glycemic profile is significantly above target.  - In the meantime, she will continue lower dose of metformin 500 mg by mouth twice a day and lower dose of Januvia 50 mg by mouth daily at breakfast.  - I will continue glimepiride 2 mg daily with breakfast. Side effects and precautions discussed with her.  -  I have approached her for monitoring blood glucose before  breakfast and at bedtime.  -Patient is encouraged to call clinic for blood glucose levels less than 70 or above 300 mg /dl.  - Patient specific target  for A1c; LDL, HDL, Triglycerides, and  Waist Circumference were discussed in detail.  2) BP/HTN: Controlled. Continue current medications including CEI. 3) Lipids/HPL:  continue statins. 4)  Weight/Diet: CDE consult in progress, exercise, and carbohydrates information provided. 5)  Vitamin D deficiency - She is  s/p 12 weeks of  vitamin D therapy, 50,000 units weekly . I will continue vitamin D3 5000 units daily for the next 90 days. 5) Chronic Care/Health Maintenance:  -Patien is on ACEI and Statin medications and encouraged to continue to follow up with Ophthalmology, Podiatrist at least yearly or according to recommendations, and advised to stay away from smoking. I have recommended  yearly flu vaccine and pneumonia vaccination at least every 5 years; moderate intensity exercise for up to 150 minutes weekly; and  sleep for at least 7 hours a day.  I advised patient to maintain close follow up with their PCP for primary care needs.  Patient is asked to bring meter and  blood glucose logs during their next visit.   Follow up plan: Return in about 1 week (around 04/22/2017) for follow up with meter and logs- no labs.  Glade Lloyd, MD Phone: [phone]  Fax: [phone]   04/15/2017, 9:00 AM

## 2017-04-16 ENCOUNTER — Telehealth: Payer: Self-pay | Admitting: "Endocrinology

## 2017-04-16 MED ORDER — SITAGLIPTIN PHOSPHATE 50 MG PO TABS
50.0000 mg | ORAL_TABLET | Freq: Every day | ORAL | 3 refills | Status: DC
Start: 2017-04-16 — End: 2017-09-26

## 2017-04-16 NOTE — Telephone Encounter (Signed)
Rx sent 

## 2017-04-16 NOTE — Telephone Encounter (Signed)
[name] is calling stating she has questions about her sitaGLIPtin (JANUVIA) 50 MG tablet Rx, please advise?

## 2017-04-23 DIAGNOSIS — G252 Other specified forms of tremor: Secondary | ICD-10-CM | POA: Diagnosis not present

## 2017-04-23 DIAGNOSIS — E1165 Type 2 diabetes mellitus with hyperglycemia: Secondary | ICD-10-CM | POA: Diagnosis not present

## 2017-04-23 DIAGNOSIS — Z6830 Body mass index (BMI) 30.0-30.9, adult: Secondary | ICD-10-CM | POA: Diagnosis not present

## 2017-04-24 ENCOUNTER — Encounter: Payer: Self-pay | Admitting: Neurology

## 2017-04-29 ENCOUNTER — Encounter: Payer: Self-pay | Admitting: "Endocrinology

## 2017-04-29 ENCOUNTER — Ambulatory Visit: Payer: Medicare Other | Admitting: "Endocrinology

## 2017-04-29 ENCOUNTER — Ambulatory Visit (INDEPENDENT_AMBULATORY_CARE_PROVIDER_SITE_OTHER): Payer: Medicare Other | Admitting: "Endocrinology

## 2017-04-29 VITALS — BP 129/71 | HR 63 | Ht 65.0 in | Wt 207.0 lb

## 2017-04-29 DIAGNOSIS — IMO0002 Reserved for concepts with insufficient information to code with codable children: Secondary | ICD-10-CM

## 2017-04-29 DIAGNOSIS — I10 Essential (primary) hypertension: Secondary | ICD-10-CM | POA: Diagnosis not present

## 2017-04-29 DIAGNOSIS — E6609 Other obesity due to excess calories: Secondary | ICD-10-CM

## 2017-04-29 DIAGNOSIS — E782 Mixed hyperlipidemia: Secondary | ICD-10-CM

## 2017-04-29 DIAGNOSIS — E1165 Type 2 diabetes mellitus with hyperglycemia: Secondary | ICD-10-CM

## 2017-04-29 DIAGNOSIS — E118 Type 2 diabetes mellitus with unspecified complications: Secondary | ICD-10-CM | POA: Diagnosis not present

## 2017-04-29 DIAGNOSIS — Z6830 Body mass index (BMI) 30.0-30.9, adult: Secondary | ICD-10-CM | POA: Diagnosis not present

## 2017-04-29 MED ORDER — GLUCOSE BLOOD VI STRP: ORAL_STRIP | 5 refills | Status: DC

## 2017-04-29 NOTE — Progress Notes (Signed)
Subjective:    Patient ID: [name], female    DOB: [birth year],    Past Medical History:  Diagnosis Date  . Diabetes mellitus without complication ([place])   . Hypertension    Past Surgical History:  Procedure Laterality Date  . ABDOMINAL HYSTERECTOMY    . APPENDECTOMY    . BREAST SURGERY     begnin tumor removed   Social History   Social History  . Marital status: Married    Spouse name: N/A  . Number of children: N/A  . Years of education: N/A   Social History Main Topics  . Smoking status: Never Smoker  . Smokeless tobacco: Never Used  . Alcohol use No  . Drug use: No  . Sexual activity: Not on file   Other Topics Concern  . Not on file   Social History Narrative  . No narrative on file   Outpatient Encounter Prescriptions as of 04/29/2017  Medication Sig  . ALPRAZolam (XANAX) 1 MG tablet Take 1 mg by mouth daily.   . Cholecalciferol (VITAMIN D3) 5000 units CAPS Take 1 capsule (5,000 Units total) by mouth daily.  . furosemide (LASIX) 20 MG tablet Take 20 mg by mouth daily as needed (for fluid retention).   . gabapentin (NEURONTIN) 300 MG capsule Take 1 capsule (300 mg total) by mouth 3 (three) times daily.  [name] glimepiride (AMARYL) 2 MG tablet Take 1 tablet (2 mg total) by mouth daily with breakfast.  . glucose blood (ONETOUCH VERIO) test strip USE ONE STRIP TO CHECK GLUCOSE 4 TIMES DAILY  . lisinopril (PRINIVIL,ZESTRIL) 20 MG tablet Take 20 mg by mouth 2 (two) times daily.  . metFORMIN (GLUCOPHAGE) 500 MG tablet Take 1 tablet (500 mg total) by mouth 2 (two) times daily with a meal.  . metoprolol succinate (TOPROL-XL) 50 MG 24 hr tablet Take 50 mg by mouth daily. Take with or immediately following a meal.  . MYRBETRIQ 50 MG TB24 tablet Take 50 mg by mouth daily.  . simvastatin (ZOCOR) 20 MG tablet Take 20 mg by mouth every evening.  . sitaGLIPtin (JANUVIA) 50 MG tablet Take 1 tablet (50 mg total) by mouth daily.  [name] tiZANidine (ZANAFLEX) 2 MG tablet  Take by mouth every 6 (six) hours as needed for muscle spasms.  . traMADol (ULTRAM) 50 MG tablet Take 1 tablet (50 mg total) by mouth every 6 (six) hours as needed.  . Vitamin D, Ergocalciferol, (DRISDOL) 50000 units CAPS capsule Take 1 capsule (50,000 Units total) by mouth once a week.  . zolpidem (AMBIEN) 10 MG tablet Take 10 mg by mouth at bedtime as needed for sleep.  . [DISCONTINUED] ONETOUCH VERIO test strip USE ONE STRIP TO CHECK GLUCOSE 4 TIMES DAILY   No facility-administered encounter medications on file as of 04/29/2017.    ALLERGIES: Allergies  Allergen Reactions  . Codeine Nausea And Vomiting   VACCINATION STATUS:  There is no immunization history on file for this patient.  Diabetes  She presents for her follow-up diabetic visit. She has type 2 diabetes mellitus. Onset time: She was diagnosed at approximate age of 70 years. Her disease course has been worsening. There are no hypoglycemic associated symptoms. Pertinent negatives for hypoglycemia include no confusion, headaches, pallor or seizures. Associated symptoms include fatigue. Pertinent negatives for diabetes include no chest pain, no polydipsia, no polyphagia and no polyuria. There are no hypoglycemic complications. Symptoms are worsening. There are no diabetic complications. Risk factors for coronary artery disease include  dyslipidemia, diabetes mellitus and hypertension. Current diabetic treatment includes oral agent (dual therapy). Her weight is increasing steadily. She is following a generally unhealthy diet. She has had a previous visit with a dietitian. Her overall blood glucose range is 140-180 mg/dl. (She monitored 12 times in the last 30 days average 159.) An ACE inhibitor/angiotensin II receptor blocker is being taken.  Hyperlipidemia  This is a chronic problem. The current episode started more than 1 year ago. Pertinent negatives include no chest pain, myalgias or shortness of breath. Current antihyperlipidemic  treatment includes statins. Risk factors for coronary artery disease include diabetes mellitus, dyslipidemia and hypertension.  Hypertension  This is a chronic problem. The current episode started more than 1 year ago. The problem is controlled. Pertinent negatives include no chest pain, headaches, palpitations or shortness of breath. Past treatments include ACE inhibitors. The current treatment provides moderate improvement.     Review of Systems  Constitutional: Positive for fatigue. Negative for unexpected weight change.  HENT: Negative for trouble swallowing and voice change.   Eyes: Negative for visual disturbance.  Respiratory: Negative for cough, shortness of breath and wheezing.   Cardiovascular: Negative for chest pain, palpitations and leg swelling.  Gastrointestinal: Negative for diarrhea, nausea and vomiting.  Endocrine: Negative for cold intolerance, heat intolerance, polydipsia, polyphagia and polyuria.  Musculoskeletal: Positive for gait problem. Negative for arthralgias and myalgias.  Skin: Negative for color change, pallor, rash and wound.  Neurological: Negative for seizures and headaches.  Psychiatric/Behavioral: Negative for confusion and suicidal ideas.    Objective:    BP 129/71   Pulse 63   Ht 5\' 5"  (1.651 m)   Wt 207 lb (93.9 kg)   BMI 34.45 kg/m   Wt Readings from Last 3 Encounters:  04/29/17 207 lb (93.9 kg)  04/15/17 210 lb (95.3 kg)  04/12/17 199 lb (90.3 kg)    Physical Exam  Constitutional: She is oriented to person, place, and time. She appears well-developed.  HENT:  Head: Normocephalic and atraumatic.  Eyes: EOM are normal.  Neck: Normal range of motion. Neck supple. No tracheal deviation present. No thyromegaly present.  Cardiovascular: Normal rate and regular rhythm.   Pulmonary/Chest: Effort normal and breath sounds normal.  Abdominal: Soft. Bowel sounds are normal. There is no tenderness. There is no guarding.  Musculoskeletal: Normal  range of motion. She exhibits no edema.  Neurological: She is alert and oriented to person, place, and time. She has normal reflexes. No cranial nerve deficit. Coordination normal.  Skin: Skin is warm and dry. No rash noted. No erythema. No pallor.  Psychiatric: She has a normal mood and affect. Judgment normal.    Results for orders placed or performed in visit on 04/15/17  Hemoglobin A1c  Result Value Ref Range   Hemoglobin A1C 10.4    Diabetic Labs (most recent): Lab Results  Component Value Date   HGBA1C 10.4 03/12/2017   HGBA1C 10.0 (H) 08/14/2016   HGBA1C 7.3 (H) 02/13/2016     Assessment & Plan:   1. Uncontrolled type 2 diabetes mellitus with CKD as a complication, without long-term current use of insulin ([place])  She came with improved glycemic profile, EAG of 157 in last 7 days. - Patient remains at a high risk for more acute and chronic complications of diabetes which include CAD, CVA, CKD, retinopathy, and neuropathy. These are all discussed in detail with the patient.  - I have re-counseled the patient on diet management and weight loss  by adopting a  carbohydrate restricted / protein rich  Diet. - Patient is advised to stick to a routine mealtimes to eat 3 meals  a day and avoid unnecessary snacks ( to snack only to correct hypoglycemia).  - Suggestion is made for patient to avoid simple carbohydrates   from their diet including Cakes , Desserts, Ice Cream,  Soda (  diet and regular) , Sweet Tea , Candies,  Chips, Cookies, Artificial Sweeteners,   and "Sugar-free" Products .  This will help patient to have stable blood glucose profile and potentially avoid unintended  Weight gain.  - The patient  has been  scheduled with Jearld Fenton, RDN, CDE for individualized DM education. - I have approached patient with the following individualized plan to manage diabetes and patient agrees.    - She recently had a1c of 10.4%, but came back with target EAG of 157. - She will not  need insulin treatment for now, and she would like to avoid it.  - She will continue  metformin 500 mg by mouth twice a day, Januvia 50 mg by mouth daily at breakfast.  - I will continue glimepiride 2 mg daily with breakfast. Side effects and precautions discussed with her.  -  I have approached her for monitoring blood glucose before  breakfast and at bedtime.  -Patient is encouraged to call clinic for blood glucose levels less than 70 or above 300 mg /dl.  - Patient specific target  for A1c; LDL, HDL, Triglycerides, and  Waist Circumference were discussed in detail.  2) BP/HTN: Controlled. Continue current medications including CEI. 3) Lipids/HPL:  continue statins. 4)  Weight/Diet: CDE consult in progress, exercise, and carbohydrates information provided. 5)  Vitamin D deficiency - She is  s/p 12 weeks of  vitamin D therapy, 50,000 units weekly . I will continue vitamin D3 5000 units daily for the next 90 days. 5) Chronic Care/Health Maintenance:  -Patien is on ACEI and Statin medications and encouraged to continue to follow up with Ophthalmology, Podiatrist at least yearly or according to recommendations, and advised to stay away from smoking. I have recommended yearly flu vaccine and pneumonia vaccination at least every 5 years; moderate intensity exercise for up to 150 minutes weekly; and  sleep for at least 7 hours a day.  I advised patient to maintain close follow up with their PCP for primary care needs.  Patient is asked to bring meter and  blood glucose logs during her next visit.   Follow up plan: Return in about 9 weeks (around 07/01/2017) for follow up with pre-visit labs, meter, and logs.  Glade Lloyd, MD Phone: [phone]  Fax: [phone]   04/29/2017, 9:04 AM

## 2017-05-10 NOTE — Progress Notes (Signed)
[name] was seen today in the movement disorders clinic for neurologic consultation at the request of Sharilyn Sites, MD.  The consultation is for the evaluation of tremor.  Pt states that when she stands up for "a little while" she will start shaking.  She will note she will have to sit down.  When asked if it is tremor or weakness she states that she is not sure which it is.  If she stays standing up "I'm afraid I would fall."  It never happens when seated.  She isn't sure if other people can see it but thinks that perhaps they could.  It doesn't happen at any particular time of day.  She isn't sure if eating would help (she is diabetic).  She has done PT and just finished this about 3-4 weeks ago and it didn't seem to help.  If she is walking she is good; the issue becomes if she is standing stationary.  She doesn't feel lightheaded when it happens.  She has xanax on her list but states that she has not had that in 2 years and only took it for sleep.  Uses ambien now.      ALLERGIES:   Allergies  Allergen Reactions  . Codeine Nausea And Vomiting    CURRENT MEDICATIONS:  Outpatient Encounter Prescriptions as of 05/14/2017  Medication Sig  . Cholecalciferol (VITAMIN D3) 5000 units CAPS Take 1 capsule (5,000 Units total) by mouth daily.  . furosemide (LASIX) 20 MG tablet Take 20 mg by mouth daily as needed (for fluid retention).   . gabapentin (NEURONTIN) 300 MG capsule Take 1 capsule (300 mg total) by mouth 3 (three) times daily.  [name] glimepiride (AMARYL) 2 MG tablet Take 1 tablet (2 mg total) by mouth daily with breakfast.  . glucose blood (ONETOUCH VERIO) test strip USE ONE STRIP TO CHECK GLUCOSE 4 TIMES DAILY  . lisinopril (PRINIVIL,ZESTRIL) 20 MG tablet Take 20 mg by mouth 2 (two) times daily.  . metFORMIN (GLUCOPHAGE) 500 MG tablet Take 1 tablet (500 mg total) by mouth 2 (two) times daily with a meal.  . metoprolol succinate (TOPROL-XL) 50 MG 24 hr tablet Take 50 mg by mouth  daily. Take with or immediately following a meal.  . MYRBETRIQ 50 MG TB24 tablet Take 50 mg by mouth daily.  . simvastatin (ZOCOR) 20 MG tablet Take 20 mg by mouth every evening.  . sitaGLIPtin (JANUVIA) 50 MG tablet Take 1 tablet (50 mg total) by mouth daily.  [name] tiZANidine (ZANAFLEX) 2 MG tablet Take by mouth every 6 (six) hours as needed for muscle spasms.  . traMADol (ULTRAM) 50 MG tablet Take 1 tablet (50 mg total) by mouth every 6 (six) hours as needed.  . zolpidem (AMBIEN) 10 MG tablet Take 10 mg by mouth at bedtime as needed for sleep.  . [DISCONTINUED] ALPRAZolam (XANAX) 1 MG tablet Take 1 mg by mouth daily.   . [DISCONTINUED] Vitamin D, Ergocalciferol, (DRISDOL) 50000 units CAPS capsule Take 1 capsule (50,000 Units total) by mouth once a week.   No facility-administered encounter medications on file as of 05/14/2017.     PAST MEDICAL HISTORY:   Past Medical History:  Diagnosis Date  . Diabetes mellitus without complication ([place])   . Diabetic neuropathy ([place])   . Hypertension   . Urinary incontinence     PAST SURGICAL HISTORY:   Past Surgical History:  Procedure Laterality Date  . ABDOMINAL HYSTERECTOMY    . APPENDECTOMY    .  BREAST SURGERY     begnin tumor removed  . CATARACT EXTRACTION, BILATERAL      SOCIAL HISTORY:   Social History   Social History  . Marital status: Married    Spouse name: N/A  . Number of children: N/A  . Years of education: N/A   Occupational History  . 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 Smoking status: Never Smoker  . Smokeless tobacco: Never Used  . Alcohol use No  . Drug use: No  . Sexual activity: Not on file   Other Topics Concern  . Not on file   Social History Narrative  . No narrative on file    FAMILY HISTORY:   Family Status  Relation Status  . Mother Deceased  . Father Deceased  . Brother Deceased  . Brother Deceased  . Child Alive    ROS:  A complete 10 system review of  systems was obtained and was unremarkable apart from what is mentioned above.  PHYSICAL EXAMINATION:    VITALS:   Vitals:   05/14/17 1319  BP: 138/88  Pulse: 68  SpO2: 96%  Weight: 210 lb (95.3 kg)  Height: 5\' 5"  (1.651 m)    GEN:  The patient appears stated age and is in NAD. HEENT:  Normocephalic, atraumatic.  The mucous membranes are moist. The superficial temporal arteries are without ropiness or tenderness. CV:  RRR Lungs:  CTAB Neck/HEME:  There are no carotid bruits bilaterally.  Neurological examination:  Orientation: The patient is alert and oriented x3. Fund of knowledge is appropriate.  Recent and remote memory are intact.  Attention and concentration are normal.    Able to name objects and repeat phrases. Cranial nerves: There is good facial symmetry. Pupils are equal round and reactive to light bilaterally. Fundoscopic exam is attempted but the disc margins are not well visualized bilaterally. Extraocular muscles are intact. The visual fields are full to confrontational testing. The speech is fluent and clear. Soft palate rises symmetrically and there is no tongue deviation. Hearing is intact to conversational tone. Sensation: Sensation is intact to light and pinprick throughout (facial, trunk, extremities). Pinprick is decreased in a stocking distribution.  Vibration is absent at the bilateral big toe and overall decreased distally. There is no extinction with double simultaneous stimulation. There is no sensory dermatomal level identified. Motor: Strength is 5/5 in the bilateral upper and lower extremities.   Shoulder shrug is equal and symmetric.  There is no pronator drift. Deep tendon reflexes: Deep tendon reflexes are 1/4 at the bilateral biceps, triceps, brachioradialis, and absent at the bilateral patella and achilles. Plantar responses are downgoing bilaterally.  Movement examination: Tone: There is normal tone in the bilateral upper extremities.  The tone in the  lower extremities is normal.  Abnormal movements: There is mild postural tremor.  If standing still, pt develops tremor in the lower legs that is repetitive and nearly falls Coordination:  There is no decremation with RAM's, with any form of RAMS, including alternating supination and pronation of the forearm, hand opening and closing, finger taps, heel taps and toe taps. Gait and Station: The patient has no difficulty arising out of a deep-seated chair without the use of the hands. The patient's stride length is good and she is fairly steady with the cane.  She is unstable in the romberg position with eyes closed.  As above, once she stands still for just a few seconds, tremor  develops in the legs.      Labs:    Chemistry      Component Value Date/Time   NA 137 08/14/2016 1035   K 5.1 08/14/2016 1035   CL 95 (L) 08/14/2016 1035   CO2 27 08/14/2016 1035   BUN 20 08/14/2016 1035   CREATININE 1.03 (H) 08/14/2016 1035      Component Value Date/Time   CALCIUM 10.1 08/14/2016 1035   ALKPHOS 67 09/27/2015 1030   AST 11 09/27/2015 1030   ALT 11 09/27/2015 1030   BILITOT 0.5 09/27/2015 1030     No results found for: TSH  Lab Results  Component Value Date   HGBA1C 10.4 03/12/2017   Lab Results  Component Value Date   WBC 13.3 (H) 09/07/2009   HGB 11.2 (L) 09/07/2009   HCT 33.3 (L) 09/07/2009   MCV 92.6 09/07/2009   PLT 222 09/07/2009     ASSESSMENT/PLAN:  1.  Orthostatic Tremor  -Discussed with the patient the nature and etiology.  Discussed with her that this can be very resistant to treatment.  Discussed with her that we could use clonazepam every low dosages, but that this can be particularly risky in her age group.  Talked about cognitive changes and risk of fall.  Talked about the fact that we will start a very low dose, 0.25 mg in the morning.  Talked about the fact that I wanted her to try this when she was just in her home environment she completely understood the risks.   She wanted to try it.  We may need a higher dose, but I am worried about this and we will need to see how she reacts.  We will call her in a few weeks.  2.  Uncontrolled DM with significant diabetic peripheral neuropathy.  -seeing endocrinology.  -This also certainly can affect gait and balance.  We discussed safety associated with peripheral neuropathy.  We discussed balance therapy and the importance of ambulatory assistive device for balance assistance.  3.  Follow up in 8-12 weeks.  Much greater than 50% of this visit was spent in counseling and coordinating care.  Total face to face time:  45 min    Cc:  Sharilyn Sites, MD

## 2017-05-14 ENCOUNTER — Encounter: Payer: Self-pay | Admitting: Neurology

## 2017-05-14 ENCOUNTER — Ambulatory Visit (INDEPENDENT_AMBULATORY_CARE_PROVIDER_SITE_OTHER): Payer: Medicare Other | Admitting: Neurology

## 2017-05-14 VITALS — BP 138/88 | HR 68 | Ht 65.0 in | Wt 210.0 lb

## 2017-05-14 DIAGNOSIS — G252 Other specified forms of tremor: Secondary | ICD-10-CM | POA: Diagnosis not present

## 2017-05-14 MED ORDER — CLONAZEPAM 0.5 MG PO TABS: 0.2500 mg | ORAL_TABLET | Freq: Every day | ORAL | 0 refills | Status: DC

## 2017-05-14 NOTE — Patient Instructions (Signed)
Start Clonazepam 0.5 mg tablets to take 1/2 tablet in the morning.  I will call in two weeks to see how you are doing.

## 2017-05-29 DIAGNOSIS — Z1231 Encounter for screening mammogram for malignant neoplasm of breast: Secondary | ICD-10-CM | POA: Diagnosis not present

## 2017-06-03 ENCOUNTER — Telehealth: Payer: Self-pay | Admitting: Neurology

## 2017-06-03 NOTE — Telephone Encounter (Signed)
Is it making her too sleepy?  If not, try one tablet daily

## 2017-06-03 NOTE — Telephone Encounter (Signed)
Patient called back and she states medication did not make her sleepy. She will increase to one tablet daily.

## 2017-06-03 NOTE — Telephone Encounter (Signed)
Tried to call patient back with no answer. Will try again later.

## 2017-06-03 NOTE — Telephone Encounter (Signed)
Spoke with patient. She states after starting Clonazepam 0.5 mg - 1/2 tablet in the morning she has noticed no difference in her orthostatic tremor. She tried to get a mammogram and was shaking so bad while standing she was not able to complete the test.  Please advise.

## 2017-06-20 ENCOUNTER — Encounter (HOSPITAL_COMMUNITY): Payer: Self-pay

## 2017-06-20 NOTE — Therapy (Signed)
[place], [place], [address] Phone: [phone]   Fax:  [phone]  Patient Details  Name: [name] MRN: [id number] Date of Birth: [birth year] Referring Provider:  No ref. provider found  Encounter Date: 06/20/2017   PHYSICAL THERAPY DISCHARGE SUMMARY  Visits from Start of Care: 3  Current functional level related to goals / functional outcomes: See last treatment note.   Remaining deficits: See last treatment note   Education / Equipment: n/a Plan: Patient agrees to discharge.  Patient goals were not met. Patient is being discharged due to not returning since the last visit.  ?????     Geraldine Solar PT, [place] [address] [place], [place], [address] Phone: [phone]   Fax:  [phone]

## 2017-06-21 DIAGNOSIS — E1142 Type 2 diabetes mellitus with diabetic polyneuropathy: Secondary | ICD-10-CM | POA: Diagnosis not present

## 2017-06-21 DIAGNOSIS — B351 Tinea unguium: Secondary | ICD-10-CM | POA: Diagnosis not present

## 2017-07-01 ENCOUNTER — Ambulatory Visit: Payer: Medicare Other | Admitting: "Endocrinology

## 2017-07-14 NOTE — Progress Notes (Signed)
[name] was seen today in the movement disorders clinic for neurologic consultation at the request of Sharilyn Sites, MD.  The consultation is for the evaluation of tremor.  Pt states that when she stands up for "a little while" she will start shaking.  She will note she will have to sit down.  When asked if it is tremor or weakness she states that she is not sure which it is.  If she stays standing up "I'm afraid I would fall."  It never happens when seated.  She isn't sure if other people can see it but thinks that perhaps they could.  It doesn't happen at any particular time of day.  She isn't sure if eating would help (she is diabetic).  She has done PT and just finished this about 3-4 weeks ago and it didn't seem to help.  If she is walking she is good; the issue becomes if she is standing stationary.  She doesn't feel lightheaded when it happens.  She has xanax on her list but states that she has not had that in 2 years and only took it for sleep.  Uses ambien now.    07/16/17 update:  Pt f/u today.  She was started on low dose klonopin.  After she started that, she gave [place] feedback that it had no SE but wasn't helping and tremor was so bad that she wasn't able to stand for mammogram.  We cautiously increased this to one tablet daily.  She states that it is helping without side effects.  She did run out of it a few days ago. She did have a fall but it was related to her knees giving out; she went to ortho and got an injection in her knees and she is hoping it will help her.  She is having trouble getting to sleep at night.  No sleeping during day.  She takes unisom nightly.  She tried melatonin 12 mg without relief.  She is on gabapentin 300 mg tid for neuropathy.  She takes zanaflex but in the AM.    ALLERGIES:   Allergies  Allergen Reactions  . Codeine Nausea And Vomiting    CURRENT MEDICATIONS:  Outpatient Encounter Prescriptions as of 07/16/2017  Medication Sig  .  Cholecalciferol (VITAMIN D3) 5000 units CAPS Take 1 capsule (5,000 Units total) by mouth daily.  . clonazePAM (KLONOPIN) 0.5 MG tablet Take 0.5 tablets (0.25 mg total) by mouth daily. (Patient taking differently: Take 0.5 mg by mouth daily. )  . furosemide (LASIX) 20 MG tablet Take 20 mg by mouth daily as needed (for fluid retention).   . gabapentin (NEURONTIN) 300 MG capsule Take 1 capsule (300 mg total) by mouth 3 (three) times daily.  [name] glimepiride (AMARYL) 2 MG tablet Take 1 tablet (2 mg total) by mouth daily with breakfast.  . glucose blood (ONETOUCH VERIO) test strip USE ONE STRIP TO CHECK GLUCOSE 4 TIMES DAILY  . lisinopril (PRINIVIL,ZESTRIL) 20 MG tablet Take 20 mg by mouth 2 (two) times daily.  . metFORMIN (GLUCOPHAGE) 500 MG tablet Take 1 tablet (500 mg total) by mouth 2 (two) times daily with a meal.  . metoprolol succinate (TOPROL-XL) 50 MG 24 hr tablet Take 50 mg by mouth daily. Take with or immediately following a meal.  . MYRBETRIQ 50 MG TB24 tablet Take 50 mg by mouth daily.  . simvastatin (ZOCOR) 20 MG tablet Take 20 mg by mouth every evening.  . sitaGLIPtin (JANUVIA) 50 MG  tablet Take 1 tablet (50 mg total) by mouth daily.  [name] tiZANidine (ZANAFLEX) 2 MG tablet Take by mouth every 6 (six) hours as needed for muscle spasms.  . traMADol (ULTRAM) 50 MG tablet Take 1 tablet (50 mg total) by mouth every 6 (six) hours as needed.  . zolpidem (AMBIEN) 10 MG tablet Take 10 mg by mouth at bedtime as needed for sleep.   No facility-administered encounter medications on file as of 07/16/2017.     PAST MEDICAL HISTORY:   Past Medical History:  Diagnosis Date  . Diabetes mellitus without complication ([place])   . Diabetic neuropathy ([place])   . Hypertension   . Urinary incontinence     PAST SURGICAL HISTORY:   Past Surgical History:  Procedure Laterality Date  . ABDOMINAL HYSTERECTOMY    . APPENDECTOMY    . BREAST SURGERY     begnin tumor removed  . CATARACT EXTRACTION, BILATERAL       SOCIAL HISTORY:   Social History   Social History  . Marital status: Married    Spouse name: N/A  . Number of children: N/A  . Years of education: N/A   Occupational History  . 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 Smoking status: Never Smoker  . Smokeless tobacco: Never Used  . Alcohol use No  . Drug use: No  . Sexual activity: Not on file   Other Topics Concern  . Not on file   Social History Narrative  . No narrative on file    FAMILY HISTORY:   Family Status  Relation Status  . Mother Deceased  . Father Deceased  . Brother Deceased  . Brother Deceased  . Child Alive    ROS:  A complete 10 system review of systems was obtained and was unremarkable apart from what is mentioned above.  PHYSICAL EXAMINATION:    VITALS:   Vitals:   07/16/17 0755  BP: 120/80  Pulse: (!) 52  SpO2: 92%    GEN:  The patient appears stated age and is in NAD. HEENT:  Normocephalic, atraumatic.  The mucous membranes are moist. The superficial temporal arteries are without ropiness or tenderness. CV:  RRR Lungs:  CTAB Neck/HEME:  There are no carotid bruits bilaterally.  Neurological examination:  Orientation: The patient is alert and oriented x3. Fund of knowledge is appropriate.  Recent and remote memory are intact.  Attention and concentration are normal.    Able to name objects and repeat phrases. Cranial nerves: There is good facial symmetry. Pupils are equal round and reactive to light bilaterally. Fundoscopic exam is attempted but the disc margins are not well visualized bilaterally. Extraocular muscles are intact. The visual fields are full to confrontational testing. The speech is fluent and clear. Soft palate rises symmetrically and there is no tongue deviation. Hearing is intact to conversational tone. Sensation: Sensation is intact to light and pinprick throughout (facial, trunk, extremities). Pinprick is decreased in a stocking  distribution.  Vibration is absent at the bilateral big toe and overall decreased distally. There is no extinction with double simultaneous stimulation. There is no sensory dermatomal level identified. Motor: Strength is 5/5 in the bilateral upper and lower extremities.   Shoulder shrug is equal and symmetric.  There is no pronator drift. Deep tendon reflexes: Deep tendon reflexes are 1/4 at the bilateral biceps, triceps, brachioradialis, and absent at the bilateral patella and achilles. Plantar responses are downgoing bilaterally.  Movement examination:  Tone: There is normal tone in the bilateral upper extremities.  The tone in the lower extremities is normal.  Abnormal movements: There is mild postural tremor.  If standing still, pt develops tremor in the lower legs that is repetitive and nearly falls Coordination:  There is no decremation with RAM's, with any form of RAMS, including alternating supination and pronation of the forearm, hand opening and closing, finger taps, heel taps and toe taps. Gait and Station: The patient has no difficulty arising out of a deep-seated chair without the use of the hands. The patient's stride length is good and she is fairly steady with the cane.  She is unstable in the romberg position with eyes closed.  As above, once she stands still for just a few seconds, tremor develops in the legs.      Labs:    Chemistry      Component Value Date/Time   NA 137 08/14/2016 1035   K 5.1 08/14/2016 1035   CL 95 (L) 08/14/2016 1035   CO2 27 08/14/2016 1035   BUN 20 08/14/2016 1035   CREATININE 1.03 (H) 08/14/2016 1035      Component Value Date/Time   CALCIUM 10.1 08/14/2016 1035   ALKPHOS 67 09/27/2015 1030   AST 11 09/27/2015 1030   ALT 11 09/27/2015 1030   BILITOT 0.5 09/27/2015 1030     No results found for: TSH  Lab Results  Component Value Date   HGBA1C 10.4 03/12/2017   Lab Results  Component Value Date   WBC 13.3 (H) 09/07/2009   HGB 11.2 (L)  09/07/2009   HCT 33.3 (L) 09/07/2009   MCV 92.6 09/07/2009   PLT 222 09/07/2009     ASSESSMENT/PLAN:  1.  Orthostatic Tremor  -Discussed with the patient the nature and etiology.  Discussed with her that this can be very resistant to treatment.  Klonopin has helped and has not had SE.  We have been cautious with this given age.  She is on 0.5 mg daily.  Will [facility].  R/b/se.    2.  Uncontrolled DM with significant diabetic peripheral neuropathy.  -seeing endocrinology.  -This also certainly can affect gait and balance.  We discussed safety associated with peripheral neuropathy.  We discussed balance therapy and the importance of ambulatory assistive device for balance assistance.  3.  Insomnia  -told her to change zanaflex timing to at night.    3.  Follow up in 6 months.  Much greater than 50% of this visit was spent in counseling and coordinating care.  Total face to face time:  25 min     Cc:  Sharilyn Sites, MD

## 2017-07-15 ENCOUNTER — Encounter: Payer: Self-pay | Admitting: Orthopedic Surgery

## 2017-07-15 ENCOUNTER — Ambulatory Visit (INDEPENDENT_AMBULATORY_CARE_PROVIDER_SITE_OTHER): Payer: Medicare Other | Admitting: Orthopedic Surgery

## 2017-07-15 DIAGNOSIS — G8929 Other chronic pain: Secondary | ICD-10-CM | POA: Diagnosis not present

## 2017-07-15 DIAGNOSIS — M1711 Unilateral primary osteoarthritis, right knee: Secondary | ICD-10-CM | POA: Diagnosis not present

## 2017-07-15 DIAGNOSIS — M1712 Unilateral primary osteoarthritis, left knee: Secondary | ICD-10-CM

## 2017-07-15 DIAGNOSIS — M25561 Pain in right knee: Secondary | ICD-10-CM

## 2017-07-15 DIAGNOSIS — M25562 Pain in left knee: Secondary | ICD-10-CM

## 2017-07-15 NOTE — Progress Notes (Signed)
Patient ID: [name], female   DOB: [birth year], 83 y.o.   MRN: [id number]   FOLLOW UP VISIT    Chief Complaint  Patient presents with  . Follow-up    right knee, requests injection    HPI 83 year old request bilateral knee injections ROS  Nothing to add  Ortho Exam  Tenderness both knees A/P  Medical decision-making  Encounter Diagnoses  Name Primary?  . Chronic pain of both knees Yes  . Primary osteoarthritis of right knee   . Primary osteoarthritis of left knee     Procedure note left knee injection verbal consent was obtained to inject left knee joint  Timeout was completed to confirm the site of injection  The medications used were 40 mg of Depo-Medrol and 1% lidocaine 3 cc  Anesthesia was provided by ethyl chloride and the skin was prepped with alcohol.  After cleaning the skin with alcohol a 20-gauge needle was used to inject the left knee joint. There were no complications. A sterile bandage was applied.   Procedure note right knee injection verbal consent was obtained to inject right knee joint  Timeout was completed to confirm the site of injection  The medications used were 40 mg of Depo-Medrol and 1% lidocaine 3 cc  Anesthesia was provided by ethyl chloride and the skin was prepped with alcohol.  After cleaning the skin with alcohol a 20-gauge needle was used to inject the right knee joint. There were no complications. A sterile bandage was applied.    Arther Abbott, MD 07/15/2017 2:04 PM

## 2017-07-16 ENCOUNTER — Encounter: Payer: Self-pay | Admitting: Neurology

## 2017-07-16 ENCOUNTER — Ambulatory Visit (INDEPENDENT_AMBULATORY_CARE_PROVIDER_SITE_OTHER): Payer: Medicare Other | Admitting: Neurology

## 2017-07-16 VITALS — BP 120/80 | HR 52

## 2017-07-16 DIAGNOSIS — G252 Other specified forms of tremor: Secondary | ICD-10-CM | POA: Diagnosis not present

## 2017-07-16 DIAGNOSIS — G4709 Other insomnia: Secondary | ICD-10-CM

## 2017-07-16 MED ORDER — CLONAZEPAM 0.5 MG PO TABS: 0.5000 mg | ORAL_TABLET | Freq: Every day | ORAL | 1 refills | Status: DC

## 2017-07-16 NOTE — Patient Instructions (Signed)
We will send your RX for klonopin - 0.5 mg - 1 tablet in the AM to [facility]  Take zanaflex (tizanadine) at night instead of the AM.  That should help with the insomnia.  Make a follow up appointment in 6 months

## 2017-07-23 DIAGNOSIS — R3915 Urgency of urination: Secondary | ICD-10-CM | POA: Diagnosis not present

## 2017-07-24 ENCOUNTER — Other Ambulatory Visit: Payer: Self-pay | Admitting: "Endocrinology

## 2017-07-24 DIAGNOSIS — E118 Type 2 diabetes mellitus with unspecified complications: Secondary | ICD-10-CM | POA: Diagnosis not present

## 2017-07-24 DIAGNOSIS — E1165 Type 2 diabetes mellitus with hyperglycemia: Secondary | ICD-10-CM | POA: Diagnosis not present

## 2017-07-25 LAB — COMPREHENSIVE METABOLIC PANEL
ALT: 40 IU/L — ABNORMAL HIGH (ref 0–32)
AST: 29 IU/L (ref 0–40)
Albumin/Globulin Ratio: 1.5 (ref 1.2–2.2)
Albumin: 3.9 g/dL (ref 3.5–4.7)
Alkaline Phosphatase: 90 IU/L (ref 39–117)
BUN/Creatinine Ratio: 18 (ref 12–28)
BUN: 22 mg/dL (ref 8–27)
Bilirubin Total: 0.6 mg/dL (ref 0.0–1.2)
CO2: 27 mmol/L (ref 20–29)
Calcium: 10.1 mg/dL (ref 8.7–10.3)
Chloride: 101 mmol/L (ref 96–106)
Creatinine, Ser: 1.19 mg/dL — ABNORMAL HIGH (ref 0.57–1.00)
GFR calc Af Amer: 48 mL/min/{1.73_m2} — ABNORMAL LOW (ref >59)
GFR calc non Af Amer: 42 mL/min/{1.73_m2} — ABNORMAL LOW (ref >59)
Globulin, Total: 2.6 g/dL (ref 1.5–4.5)
Glucose: 102 mg/dL — ABNORMAL HIGH (ref 65–99)
Potassium: 4.8 mmol/L (ref 3.5–5.2)
Sodium: 142 mmol/L (ref 134–144)
Total Protein: 6.5 g/dL (ref 6.0–8.5)

## 2017-07-25 LAB — HGB A1C W/O EAG: Hgb A1c MFr Bld: 8.6 % — ABNORMAL HIGH (ref 4.8–5.6)

## 2017-07-30 ENCOUNTER — Encounter: Payer: Self-pay | Admitting: "Endocrinology

## 2017-07-30 ENCOUNTER — Ambulatory Visit (INDEPENDENT_AMBULATORY_CARE_PROVIDER_SITE_OTHER): Payer: Medicare Other | Admitting: "Endocrinology

## 2017-07-30 VITALS — BP 153/79 | HR 60 | Wt 209.0 lb

## 2017-07-30 DIAGNOSIS — E118 Type 2 diabetes mellitus with unspecified complications: Secondary | ICD-10-CM

## 2017-07-30 DIAGNOSIS — IMO0002 Reserved for concepts with insufficient information to code with codable children: Secondary | ICD-10-CM

## 2017-07-30 DIAGNOSIS — E6609 Other obesity due to excess calories: Secondary | ICD-10-CM

## 2017-07-30 DIAGNOSIS — I10 Essential (primary) hypertension: Secondary | ICD-10-CM

## 2017-07-30 DIAGNOSIS — Z6830 Body mass index (BMI) 30.0-30.9, adult: Secondary | ICD-10-CM | POA: Diagnosis not present

## 2017-07-30 DIAGNOSIS — E1165 Type 2 diabetes mellitus with hyperglycemia: Secondary | ICD-10-CM

## 2017-07-30 DIAGNOSIS — E782 Mixed hyperlipidemia: Secondary | ICD-10-CM | POA: Diagnosis not present

## 2017-07-30 NOTE — Progress Notes (Signed)
Subjective:    Patient ID: [name], female    DOB: 1929/11/19,    Past Medical History:  Diagnosis Date  . Diabetes mellitus without complication ([place])   . Diabetic neuropathy ([place])   . Hypertension   . Urinary incontinence    Past Surgical History:  Procedure Laterality Date  . ABDOMINAL HYSTERECTOMY    . APPENDECTOMY    . BREAST SURGERY     begnin tumor removed  . CATARACT EXTRACTION, BILATERAL     Social History   Social History  . Marital status: Married    Spouse name: N/A  . Number of children: N/A  . Years of education: N/A   Occupational History  . 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 Smoking status: Never Smoker  . Smokeless tobacco: Never Used  . Alcohol use No  . Drug use: No  . Sexual activity: Not Asked   Other Topics Concern  . None   Social History Narrative  . None   Outpatient Encounter Prescriptions as of 07/30/2017  Medication Sig  . Cholecalciferol (VITAMIN D3) 5000 units CAPS Take 1 capsule (5,000 Units total) by mouth daily.  . clonazePAM (KLONOPIN) 0.5 MG tablet Take 1 tablet (0.5 mg total) by mouth daily.  . furosemide (LASIX) 20 MG tablet Take 20 mg by mouth daily as needed (for fluid retention).   . gabapentin (NEURONTIN) 300 MG capsule Take 1 capsule (300 mg total) by mouth 3 (three) times daily.  [name] glimepiride (AMARYL) 2 MG tablet Take 1 tablet (2 mg total) by mouth daily with breakfast.  . glucose blood (ONETOUCH VERIO) test strip USE ONE STRIP TO CHECK GLUCOSE 4 TIMES DAILY  . lisinopril (PRINIVIL,ZESTRIL) 20 MG tablet Take 20 mg by mouth 2 (two) times daily.  . metFORMIN (GLUCOPHAGE) 500 MG tablet Take 1 tablet (500 mg total) by mouth 2 (two) times daily with a meal.  . metoprolol succinate (TOPROL-XL) 50 MG 24 hr tablet Take 50 mg by mouth daily. Take with or immediately following a meal.  . MYRBETRIQ 50 MG TB24 tablet Take 50 mg by mouth daily.  . simvastatin (ZOCOR) 20 MG tablet  Take 20 mg by mouth every evening.  . sitaGLIPtin (JANUVIA) 50 MG tablet Take 1 tablet (50 mg total) by mouth daily.  [name] tiZANidine (ZANAFLEX) 2 MG tablet Take by mouth every 6 (six) hours as needed for muscle spasms.  . traMADol (ULTRAM) 50 MG tablet Take 1 tablet (50 mg total) by mouth every 6 (six) hours as needed.  . zolpidem (AMBIEN) 10 MG tablet Take 10 mg by mouth at bedtime as needed for sleep.   No facility-administered encounter medications on file as of 07/30/2017.    ALLERGIES: Allergies  Allergen Reactions  . Codeine Nausea And Vomiting   VACCINATION STATUS:  There is no immunization history on file for this patient.  Diabetes  She presents for her follow-up diabetic visit. She has type 2 diabetes mellitus. Onset time: She was diagnosed at approximate age of 89 years. Her disease course has been improving. There are no hypoglycemic associated symptoms. Pertinent negatives for hypoglycemia include no confusion, headaches, pallor or seizures. Associated symptoms include fatigue. Pertinent negatives for diabetes include no chest pain, no polydipsia, no polyphagia and no polyuria. There are no hypoglycemic complications. Symptoms are improving. There are no diabetic complications. Risk factors for coronary artery disease include dyslipidemia, diabetes mellitus and hypertension. Current diabetic treatment includes  oral agent (dual therapy). Her weight is stable. She is following a generally unhealthy diet. She has had a previous visit with a dietitian. Her breakfast blood glucose range is generally 140-180 mg/dl. Her dinner blood glucose range is generally 140-180 mg/dl. Her overall blood glucose range is 140-180 mg/dl. An ACE inhibitor/angiotensin II receptor blocker is being taken.  Hyperlipidemia  This is a chronic problem. The current episode started more than 1 year ago. Pertinent negatives include no chest pain, myalgias or shortness of breath. Current antihyperlipidemic treatment  includes statins. Risk factors for coronary artery disease include diabetes mellitus, dyslipidemia and hypertension.  Hypertension  This is a chronic problem. The current episode started more than 1 year ago. The problem is controlled. Pertinent negatives include no chest pain, headaches, palpitations or shortness of breath. Past treatments include ACE inhibitors. The current treatment provides moderate improvement.     Review of Systems  Constitutional: Positive for fatigue. Negative for unexpected weight change.  HENT: Negative for trouble swallowing and voice change.   Eyes: Negative for visual disturbance.  Respiratory: Negative for cough, shortness of breath and wheezing.   Cardiovascular: Negative for chest pain, palpitations and leg swelling.  Gastrointestinal: Negative for diarrhea, nausea and vomiting.  Endocrine: Negative for cold intolerance, heat intolerance, polydipsia, polyphagia and polyuria.  Musculoskeletal: Positive for gait problem. Negative for arthralgias and myalgias.  Skin: Negative for color change, pallor, rash and wound.  Neurological: Negative for seizures and headaches.  Psychiatric/Behavioral: Negative for confusion and suicidal ideas.    Objective:    BP (!) 163/79   Pulse 60   Wt 209 lb (94.8 kg)   SpO2 98%   BMI 34.78 kg/m   Wt Readings from Last 3 Encounters:  07/30/17 209 lb (94.8 kg)  05/14/17 210 lb (95.3 kg)  04/29/17 207 lb (93.9 kg)    Physical Exam  Constitutional: She is oriented to person, place, and time. She appears well-developed.  HENT:  Head: Normocephalic and atraumatic.  Eyes: EOM are normal.  Neck: Normal range of motion. Neck supple. No tracheal deviation present. No thyromegaly present.  Cardiovascular: Normal rate and regular rhythm.   Pulmonary/Chest: Effort normal and breath sounds normal.  Abdominal: Soft. Bowel sounds are normal. There is no tenderness. There is no guarding.  Musculoskeletal: Normal range of motion.  She exhibits no edema.  Neurological: She is alert and oriented to person, place, and time. She has normal reflexes. No cranial nerve deficit. Coordination normal.  Skin: Skin is warm and dry. No rash noted. No erythema. No pallor.  Psychiatric: She has a normal mood and affect. Judgment normal.    Results for orders placed or performed in visit on 07/24/17  Comprehensive metabolic panel  Result Value Ref Range   Glucose 102 (H) 65 - 99 mg/dL   BUN 22 8 - 27 mg/dL   Creatinine, Ser 1.19 (H) 0.57 - 1.00 mg/dL   GFR calc non Af Amer 42 (L) >59 mL/min/1.73   GFR calc Af Amer 48 (L) >59 mL/min/1.73   BUN/Creatinine Ratio 18 12 - 28   Sodium 142 134 - 144 mmol/L   Potassium 4.8 3.5 - 5.2 mmol/L   Chloride 101 96 - 106 mmol/L   CO2 27 20 - 29 mmol/L   Calcium 10.1 8.7 - 10.3 mg/dL   Total Protein 6.5 6.0 - 8.5 g/dL   Albumin 3.9 3.5 - 4.7 g/dL   Globulin, Total 2.6 1.5 - 4.5 g/dL   Albumin/Globulin Ratio 1.5 1.2 -  2.2   Bilirubin Total 0.6 0.0 - 1.2 mg/dL   Alkaline Phosphatase 90 39 - 117 IU/L   AST 29 0 - 40 IU/L   ALT 40 (H) 0 - 32 IU/L  Hgb A1c w/o eAG  Result Value Ref Range   Hgb A1c MFr Bld 8.6 (H) 4.8 - 5.6 %   Diabetic Labs (most recent): Lab Results  Component Value Date   HGBA1C 8.6 (H) 07/24/2017   HGBA1C 10.4 03/12/2017   HGBA1C 10.0 (H) 08/14/2016     Assessment & Plan:   1. Uncontrolled type 2 diabetes mellitus with CKD as a complication, without long-term current use of insulin ([place])  She came with improved glycemic profile.  - Her A1c has improved to 8.6% from 10.4%. - Patient remains at a high risk for more acute and chronic complications of diabetes which include CAD, CVA, CKD, retinopathy, and neuropathy. These are all discussed in detail with the patient.  - I have re-counseled the patient on diet management and weight loss  by adopting a carbohydrate restricted / protein rich  Diet. - Patient is advised to stick to a routine mealtimes to eat 3 meals   a day and avoid unnecessary snacks ( to snack only to correct hypoglycemia).  - Suggestion is made for patient to avoid simple carbohydrates   from her diet including Cakes , Desserts, Ice Cream,  Soda (  diet and regular) , Sweet Tea , Candies,  Chips, Cookies, Artificial Sweeteners,   and "Sugar-free" Products .  This will help patient to have stable blood glucose profile and potentially avoid unintended  Weight gain.  - The patient  has been  scheduled with Jearld Fenton, RDN, CDE for individualized DM education. - I have approached patient with the following individualized plan to manage diabetes and patient agrees.   - She will not need insulin treatment for now, and she would like to avoid it.  - She will continue  metformin 500 mg by mouth twice a day, Januvia 50 mg by mouth daily at breakfast.  - I will continue glimepiride 2 mg daily with breakfast. Side effects and precautions discussed with her.  -  I have approached her for monitoring blood glucose 2 times a day- before  breakfast and at bedtime.  -Patient is encouraged to call clinic for blood glucose levels less than 70 or above 300 mg /dl.  - Patient specific target  for A1c; LDL, HDL, Triglycerides, and  Waist Circumference were discussed in detail.  2) BP/HTN: unControlled. She did not take her blood pressure medications this morning. Continue current medications including CEI. 3) Lipids/HPL:  continue statins. 4)  Weight/Diet: CDE consult in progress, exercise, and carbohydrates information provided. 5)  Vitamin D deficiency - She is  s/p 12 weeks of  vitamin D therapy, 50,000 units weekly x 12 weeks.  5) Chronic Care/Health Maintenance:  -Patien is on ACEI and Statin medications and encouraged to continue to follow up with Ophthalmology, Podiatrist at least yearly or according to recommendations, and advised to stay away from smoking. I have recommended yearly flu vaccine and pneumonia vaccination at least every 5 years;  and  sleep for at least 7 hours a day.  I advised patient to maintain close follow up with their PCP for primary care needs.  Patient is asked to bring meter and  blood glucose logs during her next visit.   Follow up plan: Return in about 3 months (around 10/30/2017) for meter, and logs.  Glade Lloyd, MD Phone: [phone]  Fax: [phone]   07/30/2017, 11:51 AM

## 2017-09-03 DIAGNOSIS — E1142 Type 2 diabetes mellitus with diabetic polyneuropathy: Secondary | ICD-10-CM | POA: Diagnosis not present

## 2017-09-03 DIAGNOSIS — B351 Tinea unguium: Secondary | ICD-10-CM | POA: Diagnosis not present

## 2017-09-04 DIAGNOSIS — L821 Other seborrheic keratosis: Secondary | ICD-10-CM | POA: Diagnosis not present

## 2017-09-04 DIAGNOSIS — H61001 Unspecified perichondritis of right external ear: Secondary | ICD-10-CM | POA: Diagnosis not present

## 2017-09-04 DIAGNOSIS — D692 Other nonthrombocytopenic purpura: Secondary | ICD-10-CM | POA: Diagnosis not present

## 2017-09-17 ENCOUNTER — Other Ambulatory Visit: Payer: Self-pay | Admitting: "Endocrinology

## 2017-09-20 ENCOUNTER — Other Ambulatory Visit: Payer: Self-pay

## 2017-09-26 ENCOUNTER — Other Ambulatory Visit: Payer: Self-pay

## 2017-09-26 MED ORDER — SITAGLIPTIN PHOSPHATE 50 MG PO TABS: 50.0000 mg | ORAL_TABLET | Freq: Every day | ORAL | 0 refills | Status: DC

## 2017-10-14 ENCOUNTER — Other Ambulatory Visit: Payer: Self-pay | Admitting: "Endocrinology

## 2017-10-31 ENCOUNTER — Ambulatory Visit: Payer: Medicare Other | Admitting: "Endocrinology

## 2017-11-12 DIAGNOSIS — E1142 Type 2 diabetes mellitus with diabetic polyneuropathy: Secondary | ICD-10-CM | POA: Diagnosis not present

## 2017-11-12 DIAGNOSIS — B351 Tinea unguium: Secondary | ICD-10-CM | POA: Diagnosis not present

## 2017-11-13 ENCOUNTER — Other Ambulatory Visit: Payer: Self-pay | Admitting: "Endocrinology

## 2017-11-13 DIAGNOSIS — E118 Type 2 diabetes mellitus with unspecified complications: Secondary | ICD-10-CM | POA: Diagnosis not present

## 2017-11-13 DIAGNOSIS — E1165 Type 2 diabetes mellitus with hyperglycemia: Secondary | ICD-10-CM | POA: Diagnosis not present

## 2017-11-13 DIAGNOSIS — E782 Mixed hyperlipidemia: Secondary | ICD-10-CM | POA: Diagnosis not present

## 2017-11-14 LAB — RENAL FUNCTION PANEL
Albumin: 3.8 g/dL (ref 3.5–4.7)
BUN/Creatinine Ratio: 22 (ref 12–28)
BUN: 24 mg/dL (ref 8–27)
CO2: 25 mmol/L (ref 20–29)
Calcium: 10.1 mg/dL (ref 8.7–10.3)
Chloride: 102 mmol/L (ref 96–106)
Creatinine, Ser: 1.11 mg/dL — ABNORMAL HIGH (ref 0.57–1.00)
GFR calc Af Amer: 52 mL/min/{1.73_m2} — ABNORMAL LOW (ref >59)
GFR calc non Af Amer: 45 mL/min/{1.73_m2} — ABNORMAL LOW (ref >59)
Glucose: 173 mg/dL — ABNORMAL HIGH (ref 65–99)
Phosphorus: 3.8 mg/dL (ref 2.5–4.5)
Potassium: 4.7 mmol/L (ref 3.5–5.2)
Sodium: 143 mmol/L (ref 134–144)

## 2017-11-14 LAB — LIPID PANEL W/O CHOL/HDL RATIO
Cholesterol, Total: 120 mg/dL (ref 100–199)
HDL: 45 mg/dL (ref >39)
LDL Calculated: 48 mg/dL (ref 0–99)
Triglycerides: 135 mg/dL (ref 0–149)
VLDL Cholesterol Cal: 27 mg/dL (ref 5–40)

## 2017-11-14 LAB — HGB A1C W/O EAG: Hgb A1c MFr Bld: 9.1 % — ABNORMAL HIGH (ref 4.8–5.6)

## 2017-11-20 ENCOUNTER — Encounter: Payer: Self-pay | Admitting: "Endocrinology

## 2017-11-20 ENCOUNTER — Ambulatory Visit (INDEPENDENT_AMBULATORY_CARE_PROVIDER_SITE_OTHER): Payer: Medicare Other | Admitting: "Endocrinology

## 2017-11-20 VITALS — BP 137/79 | HR 66 | Ht 69.0 in | Wt 217.0 lb

## 2017-11-20 DIAGNOSIS — I10 Essential (primary) hypertension: Secondary | ICD-10-CM

## 2017-11-20 DIAGNOSIS — E118 Type 2 diabetes mellitus with unspecified complications: Secondary | ICD-10-CM | POA: Diagnosis not present

## 2017-11-20 DIAGNOSIS — IMO0002 Reserved for concepts with insufficient information to code with codable children: Secondary | ICD-10-CM

## 2017-11-20 DIAGNOSIS — E6609 Other obesity due to excess calories: Secondary | ICD-10-CM | POA: Diagnosis not present

## 2017-11-20 DIAGNOSIS — E1165 Type 2 diabetes mellitus with hyperglycemia: Secondary | ICD-10-CM

## 2017-11-20 DIAGNOSIS — E782 Mixed hyperlipidemia: Secondary | ICD-10-CM | POA: Diagnosis not present

## 2017-11-20 DIAGNOSIS — Z6830 Body mass index (BMI) 30.0-30.9, adult: Secondary | ICD-10-CM | POA: Diagnosis not present

## 2017-11-20 MED ORDER — GLUCOSE BLOOD VI STRP: ORAL_STRIP | 3 refills | Status: DC

## 2017-11-20 NOTE — Patient Instructions (Signed)

## 2017-11-25 NOTE — Progress Notes (Signed)
Subjective:    Patient ID: [name], female    DOB: [birth year],    Past Medical History:  Diagnosis Date  . Diabetes mellitus without complication ([place])   . Diabetic neuropathy ([place])   . Hypertension   . Urinary incontinence    Past Surgical History:  Procedure Laterality Date  . ABDOMINAL HYSTERECTOMY    . APPENDECTOMY    . BREAST SURGERY     begnin tumor removed  . CATARACT EXTRACTION, BILATERAL     Social History   Socioeconomic History  . Marital status: Married    Spouse name: None  . Number of children: None  . Years of education: None  . Highest education level: None  Social Needs  . Financial resource strain: None  . Food insecurity - worry: None  . Food insecurity - inability: None  . Transportation needs - medical: None  . Transportation needs - non-medical: None  Occupational History  . 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 Smoking status: Never Smoker  . Smokeless tobacco: Never Used  Substance and Sexual Activity  . Alcohol use: No  . Drug use: No  . Sexual activity: None  Other Topics Concern  . None  Social History Narrative  . None   Outpatient Encounter Medications as of 11/20/2017  Medication Sig  . Cholecalciferol (VITAMIN D3) 5000 units CAPS Take 1 capsule (5,000 Units total) by mouth daily.  . clonazePAM (KLONOPIN) 0.5 MG tablet Take 1 tablet (0.5 mg total) by mouth daily.  . furosemide (LASIX) 20 MG tablet Take 20 mg by mouth daily as needed (for fluid retention).   . gabapentin (NEURONTIN) 300 MG capsule Take 1 capsule (300 mg total) by mouth 3 (three) times daily.  [name] glimepiride (AMARYL) 2 MG tablet Take 1 tablet (2 mg total) by mouth daily with breakfast.  . glucose blood (ONETOUCH VERIO) test strip Use as instructed  . JANUVIA 50 MG tablet TAKE 1 TABLET BY MOUTH  DAILY  . lisinopril (PRINIVIL,ZESTRIL) 20 MG tablet Take 20 mg by mouth 2 (two) times daily.  . metFORMIN (GLUCOPHAGE) 500  MG tablet Take 1 tablet (500 mg total) by mouth 2 (two) times daily with a meal.  . metoprolol succinate (TOPROL-XL) 50 MG 24 hr tablet Take 50 mg by mouth daily. Take with or immediately following a meal.  . MYRBETRIQ 50 MG TB24 tablet Take 50 mg by mouth daily.  . simvastatin (ZOCOR) 20 MG tablet Take 20 mg by mouth every evening.  [name] tiZANidine (ZANAFLEX) 2 MG tablet Take by mouth every 6 (six) hours as needed for muscle spasms.  . traMADol (ULTRAM) 50 MG tablet Take 1 tablet (50 mg total) by mouth every 6 (six) hours as needed.  . zolpidem (AMBIEN) 10 MG tablet Take 10 mg by mouth at bedtime as needed for sleep.  . [DISCONTINUED] glucose blood (ONETOUCH VERIO) test strip USE ONE STRIP TO CHECK GLUCOSE 4 TIMES DAILY  . [DISCONTINUED] JANUVIA 100 MG tablet TAKE 1 TABLET BY MOUTH  DAILY   No facility-administered encounter medications on file as of 11/20/2017.    ALLERGIES: Allergies  Allergen Reactions  . Codeine Nausea And Vomiting   VACCINATION STATUS:  There is no immunization history on file for this patient.  Diabetes  She presents for her follow-up diabetic visit. She has type 2 diabetes mellitus. Onset time: She was diagnosed at approximate age of 55 years. Her disease course has been worsening.  There are no hypoglycemic associated symptoms. Pertinent negatives for hypoglycemia include no confusion, headaches, pallor or seizures. Associated symptoms include fatigue. Pertinent negatives for diabetes include no chest pain, no polydipsia, no polyphagia and no polyuria. There are no hypoglycemic complications. Symptoms are worsening. There are no diabetic complications. Risk factors for coronary artery disease include dyslipidemia, diabetes mellitus and hypertension. Current diabetic treatment includes oral agent (dual therapy). Her weight is increasing steadily. She is following a generally unhealthy diet. She has had a previous visit with a dietitian. Her breakfast blood glucose range is  generally 180-200 mg/dl. Her dinner blood glucose range is generally 180-200 mg/dl. Her overall blood glucose range is 180-200 mg/dl. An ACE inhibitor/angiotensin II receptor blocker is being taken.  Hyperlipidemia  This is a chronic problem. The current episode started more than 1 year ago. Pertinent negatives include no chest pain, myalgias or shortness of breath. Current antihyperlipidemic treatment includes statins. Risk factors for coronary artery disease include diabetes mellitus, dyslipidemia and hypertension.  Hypertension  This is a chronic problem. The current episode started more than 1 year ago. The problem is controlled. Pertinent negatives include no chest pain, headaches, palpitations or shortness of breath. Past treatments include ACE inhibitors. The current treatment provides moderate improvement.     Review of Systems  Constitutional: Positive for fatigue. Negative for unexpected weight change.  HENT: Negative for trouble swallowing and voice change.   Eyes: Negative for visual disturbance.  Respiratory: Negative for cough, shortness of breath and wheezing.   Cardiovascular: Negative for chest pain, palpitations and leg swelling.  Gastrointestinal: Negative for diarrhea, nausea and vomiting.  Endocrine: Negative for cold intolerance, heat intolerance, polydipsia, polyphagia and polyuria.  Musculoskeletal: Positive for gait problem. Negative for arthralgias and myalgias.  Skin: Negative for color change, pallor, rash and wound.  Neurological: Negative for seizures and headaches.  Psychiatric/Behavioral: Negative for confusion and suicidal ideas.    Objective:    BP 137/79   Pulse 66   Ht 5\' 9"  (1.753 m)   Wt 217 lb (98.4 kg)   BMI 32.05 kg/m   Wt Readings from Last 3 Encounters:  11/20/17 217 lb (98.4 kg)  07/30/17 209 lb (94.8 kg)  05/14/17 210 lb (95.3 kg)    Physical Exam  Constitutional: She is oriented to person, place, and time. She appears well-developed.   HENT:  Head: Normocephalic and atraumatic.  Eyes: EOM are normal.  Neck: Normal range of motion. Neck supple. No tracheal deviation present. No thyromegaly present.  Cardiovascular: Normal rate and regular rhythm.  Pulmonary/Chest: Effort normal and breath sounds normal.  Abdominal: Soft. Bowel sounds are normal. There is no tenderness. There is no guarding.  Musculoskeletal: Normal range of motion. She exhibits no edema.  Neurological: She is alert and oriented to person, place, and time. She has normal reflexes. No cranial nerve deficit. Coordination normal.  Skin: Skin is warm and dry. No rash noted. No erythema. No pallor.  Psychiatric: She has a normal mood and affect. Judgment normal.    Results for orders placed or performed in visit on 11/13/17  Renal function panel  Result Value Ref Range   Glucose 173 (H) 65 - 99 mg/dL   BUN 24 8 - 27 mg/dL   Creatinine, Ser 1.11 (H) 0.57 - 1.00 mg/dL   GFR calc non Af Amer 45 (L) >59 mL/min/1.73   GFR calc Af Amer 52 (L) >59 mL/min/1.73   BUN/Creatinine Ratio 22 12 - 28   Sodium 143 134 -  144 mmol/L   Potassium 4.7 3.5 - 5.2 mmol/L   Chloride 102 96 - 106 mmol/L   CO2 25 20 - 29 mmol/L   Calcium 10.1 8.7 - 10.3 mg/dL   Phosphorus 3.8 2.5 - 4.5 mg/dL   Albumin 3.8 3.5 - 4.7 g/dL  Lipid Panel w/o Chol/HDL Ratio  Result Value Ref Range   Cholesterol, Total 120 100 - 199 mg/dL   Triglycerides 135 0 - 149 mg/dL   HDL 45 >39 mg/dL   VLDL Cholesterol Cal 27 5 - 40 mg/dL   LDL Calculated 48 0 - 99 mg/dL  Hgb A1c w/o eAG  Result Value Ref Range   Hgb A1c MFr Bld 9.1 (H) 4.8 - 5.6 %   Diabetic Labs (most recent): Lab Results  Component Value Date   HGBA1C 9.1 (H) 11/13/2017   HGBA1C 8.6 (H) 07/24/2017   HGBA1C 10.4 03/12/2017     Assessment & Plan:   1. Uncontrolled type 2 diabetes mellitus with CKD as a complication, without long-term current use of insulin ([facility])  - Her A1c has increased to 9.1% from 8.6%, after generally  improving from 10.4%. - She admits to dietary indiscretion and promises to do better.  - Patient remains at a high risk for more acute and chronic complications of diabetes which include CAD, CVA, CKD, retinopathy, and neuropathy. These are all discussed in detail with the patient.  - I have re-counseled the patient on diet management and weight loss  by adopting a carbohydrate restricted / protein rich  Diet. - Patient is advised to stick to a routine mealtimes to eat 3 meals  a day and avoid unnecessary snacks ( to snack only to correct hypoglycemia).  -  Suggestion is made for her to avoid simple carbohydrates  from her diet including Cakes, Sweet Desserts / Pastries, Ice Cream, Soda (diet and regular), Sweet Tea, Candies, Chips, Cookies, Store Bought Juices, Alcohol in Excess of  1-2 drinks a day, Artificial Sweeteners, and "Sugar-free" Products. This will help patient to have stable blood glucose profile and potentially avoid unintended weight gain.   - I have approached patient with the following individualized plan to manage diabetes and patient agrees.   - She  wishes to avoid insulin treatment for now. - She will continue  metformin 500 mg by mouth twice a day, Januvia 50 mg by mouth daily at breakfast.  - I will continue glimepiride 2 mg daily with breakfast. Side effects and precautions discussed with her.  -  I have approached her for monitoring blood glucose 2 times a day- before  breakfast and at bedtime.  -Patient is encouraged to call clinic for blood glucose levels less than 70 or above 300 mg /dl.  - Patient specific target  for A1c; LDL, HDL, Triglycerides, and  Waist Circumference were discussed in detail.  2) BP/HTN: Controlled.  Continue current medications including CEI. 3) Lipids/HPL:  continue statins. 4)  Weight/Diet: CDE consult in progress, exercise, and carbohydrates information provided. 5)  Vitamin D deficiency - She is  s/p 12 weeks of  vitamin D therapy,  50,000 units weekly x 12 weeks.  5) Chronic Care/Health Maintenance:  -Patien is on ACEI and Statin medications and encouraged to continue to follow up with Ophthalmology, Podiatrist at least yearly or according to recommendations, and advised to stay away from smoking. I have recommended yearly flu vaccine and pneumonia vaccination at least every 5 years; and  sleep for at least 7 hours a day.  I advised patient to maintain close follow up with her PCP for primary care needs. - Time spent with the patient: 25 min, of which >50% was spent in reviewing her sugar logs , discussing her hypo- and hyper-glycemic episodes, reviewing her current and  previous labs and insulin doses and developing a plan to avoid hypo- and hyper-glycemia.    Follow up plan: Return in about 3 months (around 02/20/2018) for meter, and logs.  Glade Lloyd, MD Phone: [phone]  Fax: [phone]  -  This note was partially dictated with voice recognition software. Similar sounding words can be transcribed inadequately or may not  be corrected upon review.  11/25/2017, 4:29 PM

## 2017-12-30 ENCOUNTER — Ambulatory Visit (INDEPENDENT_AMBULATORY_CARE_PROVIDER_SITE_OTHER): Payer: Medicare Other | Admitting: Orthopedic Surgery

## 2017-12-30 ENCOUNTER — Other Ambulatory Visit: Payer: Self-pay | Admitting: Neurology

## 2017-12-30 VITALS — BP 140/71 | HR 81 | Ht 65.0 in | Wt 214.0 lb

## 2017-12-30 DIAGNOSIS — G8929 Other chronic pain: Secondary | ICD-10-CM | POA: Diagnosis not present

## 2017-12-30 DIAGNOSIS — M25561 Pain in right knee: Secondary | ICD-10-CM

## 2017-12-30 DIAGNOSIS — M25562 Pain in left knee: Secondary | ICD-10-CM | POA: Diagnosis not present

## 2017-12-30 MED ORDER — CLONAZEPAM 0.5 MG PO TABS: 0.5000 mg | ORAL_TABLET | Freq: Every day | ORAL | 0 refills | Status: DC

## 2017-12-30 NOTE — Progress Notes (Signed)
Chief Complaint  Patient presents with  . Follow-up    Recheck on right knee.    84 year old female history of right knee arthritis and chronic right knee pain request right knee injection  Procedure note right knee injection verbal consent was obtained to inject right knee joint  Timeout was completed to confirm the site of injection  The medications used were 40 mg of Depo-Medrol and 1% lidocaine 3 cc  Anesthesia was provided by ethyl chloride and the skin was prepped with alcohol.  After cleaning the skin with alcohol a 20-gauge needle was used to inject the right knee joint. There were no complications. A sterile bandage was applied.

## 2018-01-15 NOTE — Progress Notes (Signed)
[name] was seen today in the movement disorders clinic for neurologic consultation at the request of Sharilyn Sites, MD.  The consultation is for the evaluation of tremor.  Pt states that when she stands up for "a little while" she will start shaking.  She will note she will have to sit down.  When asked if it is tremor or weakness she states that she is not sure which it is.  If she stays standing up "I'm afraid I would fall."  It never happens when seated.  She isn't sure if other people can see it but thinks that perhaps they could.  It doesn't happen at any particular time of day.  She isn't sure if eating would help (she is diabetic).  She has done PT and just finished this about 3-4 weeks ago and it didn't seem to help.  If she is walking she is good; the issue becomes if she is standing stationary.  She doesn't feel lightheaded when it happens.  She has xanax on her list but states that she has not had that in 2 years and only took it for sleep.  Uses ambien now.    07/16/17 update:  Pt f/u today.  She was started on low dose klonopin.  After she started that, she gave [place] feedback that it had no SE but wasn't helping and tremor was so bad that she wasn't able to stand for mammogram.  We cautiously increased this to one tablet daily.  She states that it is helping without side effects.  She did run out of it a few days ago. She did have a fall but it was related to her knees giving out; she went to ortho and got an injection in her knees and she is hoping it will help her.  She is having trouble getting to sleep at night.  No sleeping during day.  She takes unisom nightly.  She tried melatonin 12 mg without relief.  She is on gabapentin 300 mg tid for neuropathy.  She takes zanaflex but in the AM.    01/16/18 update:  Pt seen in f/u.  On klonopin for orthostatic tremor and doing well, without SE.  Orthostatic tremor was well controlled.  She was told last visit to try and see if changing  her zanaflex to bedtime to see if that helped her insomnia.  She reports that this helped.  The records that were made available to me were reviewed.  Saw endocrinology.  Has uncontrolled DM with A1C of 9.1.  She states that she is working on that.    ALLERGIES:   Allergies  Allergen Reactions  . Codeine Nausea And Vomiting    CURRENT MEDICATIONS:  Outpatient Encounter Medications as of 01/16/2018  Medication Sig  . Cholecalciferol (VITAMIN D3) 5000 units CAPS Take 1 capsule (5,000 Units total) by mouth daily.  . clonazePAM (KLONOPIN) 0.5 MG tablet Take 1 tablet (0.5 mg total) by mouth daily.  . furosemide (LASIX) 20 MG tablet Take 20 mg by mouth daily as needed (for fluid retention).   . gabapentin (NEURONTIN) 300 MG capsule Take 1 capsule (300 mg total) by mouth 3 (three) times daily.  [name] glimepiride (AMARYL) 2 MG tablet Take 1 tablet (2 mg total) by mouth daily with breakfast.  . glucose blood (ONETOUCH VERIO) test strip Use as instructed  . JANUVIA 50 MG tablet TAKE 1 TABLET BY MOUTH  DAILY  . lisinopril (PRINIVIL,ZESTRIL) 20 MG tablet Take 20 mg  by mouth 2 (two) times daily.  . metFORMIN (GLUCOPHAGE) 500 MG tablet Take 1 tablet (500 mg total) by mouth 2 (two) times daily with a meal.  . metoprolol succinate (TOPROL-XL) 50 MG 24 hr tablet Take 50 mg by mouth daily. Take with or immediately following a meal.  . MYRBETRIQ 50 MG TB24 tablet Take 50 mg by mouth daily.  . simvastatin (ZOCOR) 20 MG tablet Take 20 mg by mouth every evening.  [name] tiZANidine (ZANAFLEX) 2 MG tablet Take by mouth every 6 (six) hours as needed for muscle spasms.  . traMADol (ULTRAM) 50 MG tablet Take 1 tablet (50 mg total) by mouth every 6 (six) hours as needed.  . zolpidem (AMBIEN) 10 MG tablet Take 10 mg by mouth at bedtime as needed for sleep.   No facility-administered encounter medications on file as of 01/16/2018.     PAST MEDICAL HISTORY:   Past Medical History:  Diagnosis Date  . Diabetes mellitus without  complication ([place])   . Diabetic neuropathy ([place])   . Hypertension   . Urinary incontinence     PAST SURGICAL HISTORY:   Past Surgical History:  Procedure Laterality Date  . ABDOMINAL HYSTERECTOMY    . APPENDECTOMY    . BREAST SURGERY     begnin tumor removed  . CATARACT EXTRACTION, BILATERAL      SOCIAL HISTORY:   Social History   Socioeconomic History  . Marital status: Married    Spouse name: Not on file  . Number of children: Not on file  . Years of education: Not on file  . Highest education level: Not on file  Social Needs  . Financial resource strain: Not on file  . Food insecurity - worry: Not on file  . Food insecurity - inability: Not on file  . Transportation needs - medical: Not on file  . Transportation needs - non-medical: Not on file  Occupational History  . 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 Smoking status: Never Smoker  . Smokeless tobacco: Never Used  Substance and Sexual Activity  . Alcohol use: No  . Drug use: No  . Sexual activity: Not on file  Other Topics Concern  . Not on file  Social History Narrative  . Not on file    FAMILY HISTORY:   Family Status  Relation Name Status  . Mother  Deceased  . Father  Deceased  . Brother  Deceased  . Brother  Deceased  . Child 2 Alive    ROS:  A complete 10 system review of systems was obtained and was unremarkable apart from what is mentioned above.  PHYSICAL EXAMINATION:    VITALS:   Vitals:   01/16/18 1034  BP: 128/68  Pulse: (!) 54  SpO2: 96%  Weight: 212 lb (96.2 kg)  Height: 5\' 5"  (1.651 m)    Gen:  Appears stated age and in NAD. HEENT:  Normocephalic, atraumatic. The mucous membranes are moist. The superficial temporal arteries are without ropiness or tenderness. Cardiovascular: Regular rate and rhythm. Lungs: Clear to auscultation bilaterally. Neck: There are no carotid bruits noted bilaterally.  NEUROLOGICAL:  Orientation:  The patient  is alert and oriented x 3.   Cranial nerves: There is good facial symmetry.  Extraocular muscles are intact and visual fields are full to confrontational testing. Speech is fluent and clear. Soft palate rises symmetrically and there is no tongue deviation. Hearing is intact to conversational tone. Tone: Tone is  good throughout. Sensation: Sensation is intact to light touch and pinprick throughout (facial, trunk, extremities). Pinprick is decreased in a stocking distribution.  Vibration is absent at the bilateral big toe. There is no extinction with double simultaneous stimulation. There is no sensory dermatomal level identified. Coordination:  The patient has no difficulty with RAM's or FNF bilaterally. Motor: Strength is 5/5 in the bilateral upper and lower extremities.  Shoulder shrug is equal bilaterally.  There is no pronator drift.  There are no fasciculations noted. DTR's: Deep tendon reflexes are 1/4 at the bilateral biceps, triceps, brachioradialis, patella and achilles.  Plantar responses are downgoing bilaterally.   Movement examination: Tone: There is normal tone in the bilateral upper extremities.  The tone in the lower extremities is normal.  Abnormal movements: There is minimal tremor of hands today.  Mild chin tremor.  She is able to stand without tremor.   Coordination:  There is no decremation with RAM's, with any form of RAMS, including alternating supination and pronation of the forearm, hand opening and closing, finger taps, heel taps and toe taps. Gait and Station: I stood her up but didn't ambulate her from the [facility] as she didn't have her cane.  Labs:    Chemistry      Component Value Date/Time   NA 143 11/13/2017 1037   K 4.7 11/13/2017 1037   CL 102 11/13/2017 1037   CO2 25 11/13/2017 1037   BUN 24 11/13/2017 1037   CREATININE 1.11 (H) 11/13/2017 1037      Component Value Date/Time   CALCIUM 10.1 11/13/2017 1037   ALKPHOS 90 07/24/2017 0912   AST 29 07/24/2017 0912    ALT 40 (H) 07/24/2017 0912   BILITOT 0.6 07/24/2017 0912     No results found for: TSH  Lab Results  Component Value Date   HGBA1C 9.1 (H) 11/13/2017   Lab Results  Component Value Date   WBC 13.3 (H) 09/07/2009   HGB 11.2 (L) 09/07/2009   HCT 33.3 (L) 09/07/2009   MCV 92.6 09/07/2009   PLT 222 09/07/2009     ASSESSMENT/PLAN:  1.  Orthostatic Tremor  -Discussed with the patient the nature and etiology.  Discussed with her that this can be very resistant to treatment.  Klonopin has helped and has not had SE.  We have been cautious with this given age.  She is on 0.5 mg daily.  Will [facility] to [facility].  R/b/se.    2.  Uncontrolled DM with significant diabetic peripheral neuropathy.  -seeing endocrinology.  A1C was higher and patient reports she is trying to control diet better.  Discussed proper diet and exercise  -This also certainly can affect gait and balance.  We discussed safety associated with peripheral neuropathy.  We discussed balance therapy and the importance of ambulatory assistive device for balance assistance.  3.  Insomnia  -she is doing better now that she changed to q hs zanaflex  4.  Follow up is anticipated in the next 6 months, sooner should new neurologic issues arise.      Cc:  Sharilyn Sites, MD

## 2018-01-16 ENCOUNTER — Ambulatory Visit (INDEPENDENT_AMBULATORY_CARE_PROVIDER_SITE_OTHER): Payer: Medicare Other | Admitting: Neurology

## 2018-01-16 ENCOUNTER — Encounter: Payer: Self-pay | Admitting: Neurology

## 2018-01-16 ENCOUNTER — Other Ambulatory Visit: Payer: Self-pay | Admitting: "Endocrinology

## 2018-01-16 VITALS — BP 128/68 | HR 54 | Ht 65.0 in | Wt 212.0 lb

## 2018-01-16 DIAGNOSIS — G252 Other specified forms of tremor: Secondary | ICD-10-CM

## 2018-01-16 MED ORDER — CLONAZEPAM 0.5 MG PO TABS: 0.5000 mg | ORAL_TABLET | Freq: Every day | ORAL | 1 refills | Status: DC

## 2018-01-16 NOTE — Addendum Note (Signed)
Addended byMargarette Asal L on: 01/16/2018 11:10 AM   Modules accepted: Orders

## 2018-02-10 DIAGNOSIS — G4700 Insomnia, unspecified: Secondary | ICD-10-CM | POA: Diagnosis not present

## 2018-02-10 DIAGNOSIS — E6609 Other obesity due to excess calories: Secondary | ICD-10-CM | POA: Diagnosis not present

## 2018-02-10 DIAGNOSIS — Z1389 Encounter for screening for other disorder: Secondary | ICD-10-CM | POA: Diagnosis not present

## 2018-02-10 DIAGNOSIS — Z6831 Body mass index (BMI) 31.0-31.9, adult: Secondary | ICD-10-CM | POA: Diagnosis not present

## 2018-02-14 DIAGNOSIS — B351 Tinea unguium: Secondary | ICD-10-CM | POA: Diagnosis not present

## 2018-02-14 DIAGNOSIS — E1142 Type 2 diabetes mellitus with diabetic polyneuropathy: Secondary | ICD-10-CM | POA: Diagnosis not present

## 2018-02-20 ENCOUNTER — Other Ambulatory Visit: Payer: Self-pay | Admitting: "Endocrinology

## 2018-02-20 DIAGNOSIS — E118 Type 2 diabetes mellitus with unspecified complications: Secondary | ICD-10-CM | POA: Diagnosis not present

## 2018-02-20 DIAGNOSIS — E1165 Type 2 diabetes mellitus with hyperglycemia: Secondary | ICD-10-CM | POA: Diagnosis not present

## 2018-02-20 DIAGNOSIS — E1129 Type 2 diabetes mellitus with other diabetic kidney complication: Secondary | ICD-10-CM | POA: Diagnosis not present

## 2018-02-21 LAB — COMPREHENSIVE METABOLIC PANEL
ALT: 28 IU/L (ref 0–32)
AST: 26 IU/L (ref 0–40)
Albumin/Globulin Ratio: 1.4 (ref 1.2–2.2)
Albumin: 3.8 g/dL (ref 3.5–4.7)
Alkaline Phosphatase: 92 IU/L (ref 39–117)
BUN/Creatinine Ratio: 20 (ref 12–28)
BUN: 29 mg/dL — ABNORMAL HIGH (ref 8–27)
Bilirubin Total: 0.6 mg/dL (ref 0.0–1.2)
CO2: 23 mmol/L (ref 20–29)
Calcium: 9.6 mg/dL (ref 8.7–10.3)
Chloride: 100 mmol/L (ref 96–106)
Creatinine, Ser: 1.43 mg/dL — ABNORMAL HIGH (ref 0.57–1.00)
GFR calc Af Amer: 38 mL/min/{1.73_m2} — ABNORMAL LOW (ref >59)
GFR calc non Af Amer: 33 mL/min/{1.73_m2} — ABNORMAL LOW (ref >59)
Globulin, Total: 2.8 g/dL (ref 1.5–4.5)
Glucose: 239 mg/dL — ABNORMAL HIGH (ref 65–99)
Potassium: 4.4 mmol/L (ref 3.5–5.2)
Sodium: 142 mmol/L (ref 134–144)
Total Protein: 6.6 g/dL (ref 6.0–8.5)

## 2018-02-21 LAB — SPECIMEN STATUS REPORT

## 2018-02-21 LAB — HGB A1C W/O EAG: Hgb A1c MFr Bld: 10.2 % — ABNORMAL HIGH (ref 4.8–5.6)

## 2018-02-26 ENCOUNTER — Encounter: Payer: Self-pay | Admitting: "Endocrinology

## 2018-02-26 ENCOUNTER — Ambulatory Visit (INDEPENDENT_AMBULATORY_CARE_PROVIDER_SITE_OTHER): Payer: Medicare Other | Admitting: "Endocrinology

## 2018-02-26 VITALS — BP 146/80 | HR 77 | Ht 65.0 in | Wt 214.0 lb

## 2018-02-26 DIAGNOSIS — E782 Mixed hyperlipidemia: Secondary | ICD-10-CM | POA: Diagnosis not present

## 2018-02-26 DIAGNOSIS — E118 Type 2 diabetes mellitus with unspecified complications: Secondary | ICD-10-CM | POA: Diagnosis not present

## 2018-02-26 DIAGNOSIS — I10 Essential (primary) hypertension: Secondary | ICD-10-CM

## 2018-02-26 DIAGNOSIS — E1165 Type 2 diabetes mellitus with hyperglycemia: Secondary | ICD-10-CM | POA: Diagnosis not present

## 2018-02-26 DIAGNOSIS — Z6830 Body mass index (BMI) 30.0-30.9, adult: Secondary | ICD-10-CM

## 2018-02-26 DIAGNOSIS — E6609 Other obesity due to excess calories: Secondary | ICD-10-CM | POA: Diagnosis not present

## 2018-02-26 DIAGNOSIS — IMO0002 Reserved for concepts with insufficient information to code with codable children: Secondary | ICD-10-CM

## 2018-02-26 MED ORDER — INSULIN PEN NEEDLE 31G X 8 MM MISC: 1.0000 | 3 refills | Status: DC

## 2018-02-26 MED ORDER — INSULIN GLARGINE 100 UNIT/ML SOLOSTAR PEN: 16.0000 [IU] | PEN_INJECTOR | Freq: Every day | SUBCUTANEOUS | 2 refills | Status: DC

## 2018-02-26 NOTE — Progress Notes (Signed)
Subjective:    Patient ID: [name], female    DOB: 07/28/1929,    Past Medical History:  Diagnosis Date  . Diabetes mellitus without complication ([place])   . Diabetic neuropathy ([place])   . Hypertension   . Urinary incontinence    Past Surgical History:  Procedure Laterality Date  . ABDOMINAL HYSTERECTOMY    . APPENDECTOMY    . BREAST SURGERY     begnin tumor removed  . CATARACT EXTRACTION, BILATERAL     Social History   Socioeconomic History  . Marital status: Married    Spouse name: None  . Number of children: None  . Years of education: None  . Highest education level: None  Social Needs  . Financial resource strain: None  . Food insecurity - worry: None  . Food insecurity - inability: None  . Transportation needs - medical: None  . Transportation needs - non-medical: None  Occupational History  . 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 Smoking status: Never Smoker  . Smokeless tobacco: Never Used  Substance and Sexual Activity  . Alcohol use: No  . Drug use: No  . Sexual activity: None  Other Topics Concern  . None  Social History Narrative  . None   Outpatient Encounter Medications as of 02/26/2018  Medication Sig  . Cholecalciferol (VITAMIN D3) 5000 units CAPS Take 1 capsule (5,000 Units total) by mouth daily.  . clonazePAM (KLONOPIN) 0.5 MG tablet Take 1 tablet (0.5 mg total) by mouth daily.  . furosemide (LASIX) 20 MG tablet Take 20 mg by mouth daily as needed (for fluid retention).   . gabapentin (NEURONTIN) 300 MG capsule Take 1 capsule (300 mg total) by mouth 3 (three) times daily.  [name] glucose blood (ONETOUCH VERIO) test strip Use as instructed  . Insulin Glargine (LANTUS SOLOSTAR) 100 UNIT/ML Solostar Pen Inject 16 Units into the skin daily at 10 pm.  . Insulin Pen Needle (B-D ULTRAFINE III SHORT PEN) 31G X 8 MM MISC 1 each by Does not apply route as directed.  [name] JANUVIA 50 MG tablet TAKE 1 TABLET BY MOUTH   DAILY  . lisinopril (PRINIVIL,ZESTRIL) 20 MG tablet Take 20 mg by mouth 2 (two) times daily.  . metoprolol succinate (TOPROL-XL) 50 MG 24 hr tablet Take 50 mg by mouth daily. Take with or immediately following a meal.  . MYRBETRIQ 50 MG TB24 tablet Take 50 mg by mouth daily.  . simvastatin (ZOCOR) 20 MG tablet Take 20 mg by mouth every evening.  [name] tiZANidine (ZANAFLEX) 2 MG tablet Take by mouth every 6 (six) hours as needed for muscle spasms.  . traMADol (ULTRAM) 50 MG tablet Take 1 tablet (50 mg total) by mouth every 6 (six) hours as needed.  . zolpidem (AMBIEN) 10 MG tablet Take 10 mg by mouth at bedtime as needed for sleep.  . [DISCONTINUED] glimepiride (AMARYL) 2 MG tablet Take 1 tablet (2 mg total) by mouth daily with breakfast.  . [DISCONTINUED] metFORMIN (GLUCOPHAGE) 500 MG tablet Take 1 tablet (500 mg total) by mouth 2 (two) times daily with a meal.   No facility-administered encounter medications on file as of 02/26/2018.    ALLERGIES: Allergies  Allergen Reactions  . Codeine Nausea And Vomiting   VACCINATION STATUS:  There is no immunization history on file for this patient.  Diabetes  She presents for her follow-up diabetic visit. She has type 2 diabetes mellitus. Onset time: She  was diagnosed at approximate age of 30 years. Her disease course has been worsening. There are no hypoglycemic associated symptoms. Pertinent negatives for hypoglycemia include no confusion, headaches, pallor or seizures. Associated symptoms include fatigue. Pertinent negatives for diabetes include no chest pain, no polydipsia, no polyphagia and no polyuria. There are no hypoglycemic complications. Symptoms are worsening. There are no diabetic complications. Risk factors for coronary artery disease include dyslipidemia, diabetes mellitus and hypertension. Current diabetic treatment includes oral agent (dual therapy). Her weight is stable. She is following a generally unhealthy diet. She has had a previous  visit with a dietitian. Her breakfast blood glucose range is generally >200 mg/dl. Her bedtime blood glucose range is generally >200 mg/dl. Her overall blood glucose range is >200 mg/dl. An ACE inhibitor/angiotensin II receptor blocker is being taken.  Hyperlipidemia  This is a chronic problem. The current episode started more than 1 year ago. Exacerbating diseases include diabetes and obesity. Pertinent negatives include no chest pain, myalgias or shortness of breath. Current antihyperlipidemic treatment includes statins. Risk factors for coronary artery disease include diabetes mellitus, dyslipidemia, hypertension, a sedentary lifestyle, post-menopausal and obesity.  Hypertension  This is a chronic problem. The current episode started more than 1 year ago. The problem is controlled. Pertinent negatives include no chest pain, headaches, palpitations or shortness of breath. Risk factors for coronary artery disease include dyslipidemia, diabetes mellitus, family history, obesity, sedentary lifestyle and post-menopausal state. Past treatments include ACE inhibitors. The current treatment provides moderate improvement.     Review of Systems  Constitutional: Positive for fatigue. Negative for unexpected weight change.  HENT: Negative for trouble swallowing and voice change.   Eyes: Negative for visual disturbance.  Respiratory: Negative for cough, shortness of breath and wheezing.   Cardiovascular: Negative for chest pain, palpitations and leg swelling.  Gastrointestinal: Negative for diarrhea, nausea and vomiting.  Endocrine: Negative for cold intolerance, heat intolerance, polydipsia, polyphagia and polyuria.  Musculoskeletal: Positive for gait problem. Negative for arthralgias and myalgias.  Skin: Negative for color change, pallor, rash and wound.  Neurological: Negative for seizures and headaches.  Psychiatric/Behavioral: Negative for confusion and suicidal ideas.    Objective:    BP (!)  146/80   Pulse 77   Ht 5\' 5"  (1.651 m)   Wt 214 lb (97.1 kg)   BMI 35.61 kg/m   Wt Readings from Last 3 Encounters:  02/26/18 214 lb (97.1 kg)  01/16/18 212 lb (96.2 kg)  12/30/17 214 lb (97.1 kg)    Physical Exam  Constitutional: She is oriented to person, place, and time. She appears well-developed.  HENT:  Head: Normocephalic and atraumatic.  Eyes: EOM are normal.  Neck: Normal range of motion. Neck supple. No tracheal deviation present. No thyromegaly present.  Cardiovascular: Normal rate and regular rhythm.  Pulmonary/Chest: Effort normal and breath sounds normal.  Abdominal: Soft. Bowel sounds are normal. There is no tenderness. There is no guarding.  Musculoskeletal: Normal range of motion. She exhibits no edema.  Neurological: She is alert and oriented to person, place, and time. She has normal reflexes. No cranial nerve deficit. Coordination normal.  Skin: Skin is warm and dry. No rash noted. No erythema. No pallor.  Psychiatric: She has a normal mood and affect. Judgment normal.    Results for orders placed or performed in visit on 02/20/18  Comprehensive metabolic panel  Result Value Ref Range   Glucose 239 (H) 65 - 99 mg/dL   BUN 29 (H) 8 - 27 mg/dL  Creatinine, Ser 1.43 (H) 0.57 - 1.00 mg/dL   GFR calc non Af Amer 33 (L) >59 mL/min/1.73   GFR calc Af Amer 38 (L) >59 mL/min/1.73   BUN/Creatinine Ratio 20 12 - 28   Sodium 142 134 - 144 mmol/L   Potassium 4.4 3.5 - 5.2 mmol/L   Chloride 100 96 - 106 mmol/L   CO2 23 20 - 29 mmol/L   Calcium 9.6 8.7 - 10.3 mg/dL   Total Protein 6.6 6.0 - 8.5 g/dL   Albumin 3.8 3.5 - 4.7 g/dL   Globulin, Total 2.8 1.5 - 4.5 g/dL   Albumin/Globulin Ratio 1.4 1.2 - 2.2   Bilirubin Total 0.6 0.0 - 1.2 mg/dL   Alkaline Phosphatase 92 39 - 117 IU/L   AST 26 0 - 40 IU/L   ALT 28 0 - 32 IU/L  Hgb A1c w/o eAG  Result Value Ref Range   Hgb A1c MFr Bld 10.2 (H) 4.8 - 5.6 %  Specimen status report  Result Value Ref Range   specimen  status report Comment    Diabetic Labs (most recent): Lab Results  Component Value Date   HGBA1C 10.2 (H) 02/20/2018   HGBA1C 9.1 (H) 11/13/2017   HGBA1C 8.6 (H) 07/24/2017     Assessment & Plan:   1. Uncontrolled type 2 diabetes mellitus with CKD as a complication, without long-term current use of insulin (HCC)  - Her A1c has increased to 10.2%, slowly increasing from 8.6%.   - She admits to dietary indiscretion.  - Patient remains at a high risk for more acute and chronic complications of diabetes which include CAD, CVA, CKD, retinopathy, and neuropathy. These are all discussed in detail with the patient.  - I have re-counseled the patient on diet management and weight loss  by adopting a carbohydrate restricted / protein rich  Diet. - Patient is advised to stick to a routine mealtimes to eat 3 meals  a day and avoid unnecessary snacks ( to snack only to correct hypoglycemia).  -  Suggestion is made for her to avoid simple carbohydrates  from her diet including Cakes, Sweet Desserts / Pastries, Ice Cream, Soda (diet and regular), Sweet Tea, Candies, Chips, Cookies, Store Bought Juices, Alcohol in Excess of  1-2 drinks a day, Artificial Sweeteners, and "Sugar-free" Products. This will help patient to have stable blood glucose profile and potentially avoid unintended weight gain.  - I have approached patient with the following individualized plan to manage diabetes and patient agrees.   -Due to her presentation with significantly above target blood glucose profile with higher A1c of 10.2% , she is approached for addition of basal insulin and she accepts.   -I discussed and initiated Lantus 16 units nightly, associated with strict monitoring of blood glucose 4 times a day-before meals and at bedtime and return in 1 week with her meter and logs.   -Insulin injection technique has been demonstrated for her in the exam room by me.  -Her renal function is declining, discussed and  discontinued metformin and glimepiride.  -I advised her to continue Januvia 50 mg p.o. daily with breakfast.  - I will continue glimepiride 2 mg daily with breakfast. Side effects and precautions discussed with her.  -  I have approached her for monitoring blood glucose 2 times a day- before  breakfast and at bedtime.  -Patient is encouraged to call clinic for blood glucose levels less than 70 or above 300 mg /dl.  - Patient specific target  for A1c; LDL, HDL, Triglycerides, and  Waist Circumference were discussed in detail.  2) BP/HTN: Her blood pressure is not controlled to target.  She has enough blood pressure medications including ACE inhibitors .  3) Lipids/HPL: Her LDL is controlled at 48, has been taking simvastatin 20 mg p.o. daily for several years.  This medication will be stopped for during her next visit.   4)  Weight/Diet: CDE consult in progress, exercise, and carbohydrates information provided.  5)  Vitamin D deficiency - She is  s/p 12 weeks of  vitamin D therapy, 50,000 units weekly x 12 weeks.  5) Chronic Care/Health Maintenance:  -Patien is on ACEI and Statin medications and encouraged to continue to follow up with Ophthalmology, Podiatrist at least yearly or according to recommendations, and advised to stay away from smoking. I have recommended yearly flu vaccine and pneumonia vaccination at least every 5 years; and  sleep for at least 7 hours a day.  I advised patient to maintain close follow up with her PCP for primary care needs.  - Time spent with the patient: 25 min, of which >50% was spent in reviewing her blood glucose logs , discussing her hypo- and hyper-glycemic episodes, reviewing her current and  previous labs and insulin doses and developing a plan to avoid hypo- and hyper-glycemia. Please refer to Patient Instructions for Blood Glucose Monitoring and Insulin/Medications Dosing Guide"  in media tab for additional information.    Follow up plan: Return in  about 1 week (around 03/05/2018) for follow up with meter and logs- no labs.  Glade Lloyd, MD Phone: [phone]  Fax: [phone]  -  This note was partially dictated with voice recognition software. Similar sounding words can be transcribed inadequately or may not  be corrected upon review.  02/26/2018, 5:10 PM

## 2018-03-05 ENCOUNTER — Telehealth: Payer: Self-pay | Admitting: "Endocrinology

## 2018-03-05 MED ORDER — GLUCOSE BLOOD VI STRP: ORAL_STRIP | 3 refills | Status: DC

## 2018-03-05 NOTE — Telephone Encounter (Signed)
[name] is stating that she needs her test stripes called in to the pharmacy, she is testing 4 x's a day, please advise?

## 2018-03-05 NOTE — Telephone Encounter (Signed)
Rx sent 

## 2018-03-06 ENCOUNTER — Telehealth: Payer: Self-pay | Admitting: "Endocrinology

## 2018-03-10 ENCOUNTER — Ambulatory Visit (INDEPENDENT_AMBULATORY_CARE_PROVIDER_SITE_OTHER): Payer: Medicare Other | Admitting: "Endocrinology

## 2018-03-10 ENCOUNTER — Encounter: Payer: Self-pay | Admitting: "Endocrinology

## 2018-03-10 ENCOUNTER — Other Ambulatory Visit: Payer: Self-pay

## 2018-03-10 VITALS — BP 128/71 | HR 74 | Ht 65.0 in | Wt 215.0 lb

## 2018-03-10 DIAGNOSIS — IMO0002 Reserved for concepts with insufficient information to code with codable children: Secondary | ICD-10-CM

## 2018-03-10 DIAGNOSIS — E6609 Other obesity due to excess calories: Secondary | ICD-10-CM | POA: Diagnosis not present

## 2018-03-10 DIAGNOSIS — E118 Type 2 diabetes mellitus with unspecified complications: Secondary | ICD-10-CM | POA: Diagnosis not present

## 2018-03-10 DIAGNOSIS — E782 Mixed hyperlipidemia: Secondary | ICD-10-CM

## 2018-03-10 DIAGNOSIS — I10 Essential (primary) hypertension: Secondary | ICD-10-CM

## 2018-03-10 DIAGNOSIS — Z6830 Body mass index (BMI) 30.0-30.9, adult: Secondary | ICD-10-CM | POA: Diagnosis not present

## 2018-03-10 DIAGNOSIS — E1165 Type 2 diabetes mellitus with hyperglycemia: Secondary | ICD-10-CM

## 2018-03-10 MED ORDER — GLUCOSE BLOOD VI STRP: ORAL_STRIP | 3 refills | Status: DC

## 2018-03-10 MED ORDER — INSULIN GLARGINE 100 UNIT/ML SOLOSTAR PEN: 30.0000 [IU] | PEN_INJECTOR | Freq: Every day | SUBCUTANEOUS | 2 refills | Status: DC

## 2018-03-10 NOTE — Progress Notes (Signed)
Subjective:    Patient ID: [name], female    DOB: [birth year],    Past Medical History:  Diagnosis Date  . Diabetes mellitus without complication ([place])   . Diabetic neuropathy ([place])   . Hypertension   . Urinary incontinence    Past Surgical History:  Procedure Laterality Date  . ABDOMINAL HYSTERECTOMY    . APPENDECTOMY    . BREAST SURGERY     begnin tumor removed  . CATARACT EXTRACTION, BILATERAL     Social History   Socioeconomic History  . Marital status: Married    Spouse name: None  . Number of children: None  . Years of education: None  . Highest education level: None  Social Needs  . Financial resource strain: None  . Food insecurity - worry: None  . Food insecurity - inability: None  . Transportation needs - medical: None  . Transportation needs - non-medical: None  Occupational History  . 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 Smoking status: Never Smoker  . Smokeless tobacco: Never Used  Substance and Sexual Activity  . Alcohol use: No  . Drug use: No  . Sexual activity: None  Other Topics Concern  . None  Social History Narrative  . None   Outpatient Encounter Medications as of 03/10/2018  Medication Sig  . Cholecalciferol (VITAMIN D3) 5000 units CAPS Take 1 capsule (5,000 Units total) by mouth daily.  . clonazePAM (KLONOPIN) 0.5 MG tablet Take 1 tablet (0.5 mg total) by mouth daily.  . furosemide (LASIX) 20 MG tablet Take 20 mg by mouth daily as needed (for fluid retention).   . gabapentin (NEURONTIN) 300 MG capsule Take 1 capsule (300 mg total) by mouth 3 (three) times daily.  . Insulin Glargine (LANTUS SOLOSTAR) 100 UNIT/ML Solostar Pen Inject 30 Units into the skin daily at 10 pm.  . Insulin Pen Needle (B-D ULTRAFINE III SHORT PEN) 31G X 8 MM MISC 1 each by Does not apply route as directed.  [name] JANUVIA 50 MG tablet TAKE 1 TABLET BY MOUTH  DAILY  . lisinopril (PRINIVIL,ZESTRIL) 20 MG tablet Take 20  mg by mouth 2 (two) times daily.  . metoprolol succinate (TOPROL-XL) 50 MG 24 hr tablet Take 50 mg by mouth daily. Take with or immediately following a meal.  . MYRBETRIQ 50 MG TB24 tablet Take 50 mg by mouth daily.  . simvastatin (ZOCOR) 20 MG tablet Take 20 mg by mouth every evening.  [name] tiZANidine (ZANAFLEX) 2 MG tablet Take by mouth every 6 (six) hours as needed for muscle spasms.  . traMADol (ULTRAM) 50 MG tablet Take 1 tablet (50 mg total) by mouth every 6 (six) hours as needed.  . zolpidem (AMBIEN) 10 MG tablet Take 10 mg by mouth at bedtime as needed for sleep.  . [DISCONTINUED] Insulin Glargine (LANTUS SOLOSTAR) 100 UNIT/ML Solostar Pen Inject 16 Units into the skin daily at 10 pm.   No facility-administered encounter medications on file as of 03/10/2018.    ALLERGIES: Allergies  Allergen Reactions  . Codeine Nausea And Vomiting   VACCINATION STATUS:  There is no immunization history on file for this patient.  Diabetes  She presents for her follow-up diabetic visit. She has type 2 diabetes mellitus. Onset time: She was diagnosed at approximate age of 89 years. Her disease course has been worsening. There are no hypoglycemic associated symptoms. Pertinent negatives for hypoglycemia include no confusion, headaches, pallor or seizures. Associated  symptoms include fatigue. Pertinent negatives for diabetes include no chest pain, no polydipsia, no polyphagia and no polyuria. There are no hypoglycemic complications. Symptoms are worsening. There are no diabetic complications. Risk factors for coronary artery disease include dyslipidemia, diabetes mellitus and hypertension. Current diabetic treatment includes oral agent (dual therapy). Her weight is stable. She is following a generally unhealthy diet. She has had a previous visit with a dietitian. Her home blood glucose trend is increasing steadily. Her breakfast blood glucose range is generally >200 mg/dl. Her lunch blood glucose range is  generally >200 mg/dl. Her dinner blood glucose range is generally >200 mg/dl. Her bedtime blood glucose range is generally >200 mg/dl. Her overall blood glucose range is >200 mg/dl. An ACE inhibitor/angiotensin II receptor blocker is being taken.  Hyperlipidemia  This is a chronic problem. The current episode started more than 1 year ago. Exacerbating diseases include diabetes and obesity. Pertinent negatives include no chest pain, myalgias or shortness of breath. Current antihyperlipidemic treatment includes statins. Risk factors for coronary artery disease include diabetes mellitus, dyslipidemia, hypertension, a sedentary lifestyle, post-menopausal and obesity.  Hypertension  This is a chronic problem. The current episode started more than 1 year ago. The problem is controlled. Pertinent negatives include no chest pain, headaches, palpitations or shortness of breath. Risk factors for coronary artery disease include dyslipidemia, diabetes mellitus, family history, obesity, sedentary lifestyle and post-menopausal state. Past treatments include ACE inhibitors. The current treatment provides moderate improvement.     Review of Systems  Constitutional: Positive for fatigue. Negative for unexpected weight change.  HENT: Negative for trouble swallowing and voice change.   Eyes: Negative for visual disturbance.  Respiratory: Negative for cough, shortness of breath and wheezing.   Cardiovascular: Negative for chest pain, palpitations and leg swelling.  Gastrointestinal: Negative for diarrhea, nausea and vomiting.  Endocrine: Negative for cold intolerance, heat intolerance, polydipsia, polyphagia and polyuria.  Musculoskeletal: Positive for gait problem. Negative for arthralgias and myalgias.  Skin: Negative for color change, pallor, rash and wound.  Neurological: Negative for seizures and headaches.  Psychiatric/Behavioral: Negative for confusion and suicidal ideas.    Objective:    BP 128/71    Pulse 74   Ht 5\' 5"  (1.651 m)   Wt 215 lb (97.5 kg)   BMI 35.78 kg/m   Wt Readings from Last 3 Encounters:  03/10/18 215 lb (97.5 kg)  02/26/18 214 lb (97.1 kg)  01/16/18 212 lb (96.2 kg)    Physical Exam  Constitutional: She is oriented to person, place, and time. She appears well-developed.  Walks with a cane.  HENT:  Head: Normocephalic and atraumatic.  Eyes: EOM are normal.  Neck: Normal range of motion. Neck supple. No tracheal deviation present. No thyromegaly present.  Cardiovascular: Normal rate.  Pulmonary/Chest: Effort normal.  Abdominal: Bowel sounds are normal. There is no tenderness. There is no guarding.  Musculoskeletal: She exhibits no edema.  Walks with a cane.  Neurological: She is alert and oriented to person, place, and time. She has normal reflexes. No cranial nerve deficit. Coordination normal.  Skin: Skin is warm and dry. No rash noted. No erythema. No pallor.  Psychiatric: She has a normal mood and affect. Judgment normal.    Results for orders placed or performed in visit on 02/20/18  Comprehensive metabolic panel  Result Value Ref Range   Glucose 239 (H) 65 - 99 mg/dL   BUN 29 (H) 8 - 27 mg/dL   Creatinine, Ser 1.43 (H) 0.57 - 1.00 mg/dL  GFR calc non Af Amer 33 (L) >59 mL/min/1.73   GFR calc Af Amer 38 (L) >59 mL/min/1.73   BUN/Creatinine Ratio 20 12 - 28   Sodium 142 134 - 144 mmol/L   Potassium 4.4 3.5 - 5.2 mmol/L   Chloride 100 96 - 106 mmol/L   CO2 23 20 - 29 mmol/L   Calcium 9.6 8.7 - 10.3 mg/dL   Total Protein 6.6 6.0 - 8.5 g/dL   Albumin 3.8 3.5 - 4.7 g/dL   Globulin, Total 2.8 1.5 - 4.5 g/dL   Albumin/Globulin Ratio 1.4 1.2 - 2.2   Bilirubin Total 0.6 0.0 - 1.2 mg/dL   Alkaline Phosphatase 92 39 - 117 IU/L   AST 26 0 - 40 IU/L   ALT 28 0 - 32 IU/L  Hgb A1c w/o eAG  Result Value Ref Range   Hgb A1c MFr Bld 10.2 (H) 4.8 - 5.6 %  Specimen status report  Result Value Ref Range   specimen status report Comment    Diabetic Labs  (most recent): Lab Results  Component Value Date   HGBA1C 10.2 (H) 02/20/2018   HGBA1C 9.1 (H) 11/13/2017   HGBA1C 8.6 (H) 07/24/2017     Assessment & Plan:   1. Uncontrolled type 2 diabetes mellitus with CKD as a complication, without long-term current use of insulin ([facility])  - Her recent A1c has increased to 10.2%, slowly increasing from 8.6%.   - She admits to dietary indiscretion.  - Patient remains at a high risk for more acute and chronic complications of diabetes which include CAD, CVA, CKD, retinopathy, and neuropathy. These are all discussed in detail with the patient.  - I have re-counseled the patient on diet management and weight loss  by adopting a carbohydrate restricted / protein rich  Diet. - Patient is advised to stick to a routine mealtimes to eat 3 meals  a day and avoid unnecessary snacks ( to snack only to correct hypoglycemia).  -  Suggestion is made for her to avoid simple carbohydrates  from her diet including Cakes, Sweet Desserts / Pastries, Ice Cream, Soda (diet and regular), Sweet Tea, Candies, Chips, Cookies, Store Bought Juices, Alcohol in Excess of  1-2 drinks a day, Artificial Sweeteners, and "Sugar-free" Products. This will help patient to have stable blood glucose profile and potentially avoid unintended weight gain.   - I have approached patient with the following individualized plan to manage diabetes and patient agrees.   -Due to her presentation with significantly above target blood glucose despite a low-dose basal insulin and her recent A1c of 10.2%, she is approached for a higher dose of insulin.    -I discussed and increase her Lantus to 30 units nightly,  associated with strict monitoring of blood glucose 4 times a day-before meals and at bedtime and return in 4 weeks with her meter and logs.   -Insulin injection technique has been demonstrated for her in the exam room by me.  -Her renal function is declining, discussed and discontinued  metformin . -I advised her to continue Januvia 50 mg p.o. daily with breakfast.  - I will continue glimepiride 2 mg daily with breakfast for now. Side effects and precautions discussed with her.  -Patient is encouraged to call clinic for blood glucose levels less than 70 or above 300 mg /dlx3.  - Patient specific target  for A1c; LDL, HDL, Triglycerides, and  Waist Circumference were discussed in detail.  2) BP/HTN: Her blood pressure is controlled to target.  She has enough blood pressure medications including ACE inhibitors .  3) Lipids/HPL: Her LDL is controlled at 48, has been taking simvastatin 20 mg p.o. daily for several years.  This medication will be stopped for during her next visit.   4)  Weight/Diet: CDE consult in progress, exercise, and carbohydrates information provided.  5)  Vitamin D deficiency - She is  s/p 12 weeks of  vitamin D therapy, 50,000 units weekly x 12 weeks.  5) Chronic Care/Health Maintenance:  -Patien is on ACEI and Statin medications and encouraged to continue to follow up with Ophthalmology, Podiatrist at least yearly or according to recommendations, and advised to stay away from smoking. I have recommended yearly flu vaccine and pneumonia vaccination at least every 5 years; and  sleep for at least 7 hours a day.  I advised patient to maintain close follow up with her PCP for primary care needs.  - Time spent with the patient: 25 min, of which >50% was spent in reviewing her blood glucose logs , discussing her hypo- and hyper-glycemic episodes, reviewing her current and  previous labs and insulin doses and developing a plan to avoid hypo- and hyper-glycemia. Please refer to Patient Instructions for Blood Glucose Monitoring and Insulin/Medications Dosing Guide"  in media tab for additional information. [name] participated in the discussions, expressed understanding, and voiced agreement with the above plans.  All questions were answered to her  satisfaction. she is encouraged to contact clinic should she have any questions or concerns prior to her return visit.   Follow up plan: Return in about 4 weeks (around 04/07/2018) for follow up with meter and logs- no labs.  Glade Lloyd, MD Phone: [phone]  Fax: [phone]  -  This note was partially dictated with voice recognition software. Similar sounding words can be transcribed inadequately or may not  be corrected upon review.  03/10/2018, 2:38 PM

## 2018-03-10 NOTE — Patient Instructions (Signed)

## 2018-03-12 NOTE — Telephone Encounter (Signed)
Opened in error

## 2018-03-13 DIAGNOSIS — L259 Unspecified contact dermatitis, unspecified cause: Secondary | ICD-10-CM | POA: Diagnosis not present

## 2018-03-13 DIAGNOSIS — H61001 Unspecified perichondritis of right external ear: Secondary | ICD-10-CM | POA: Diagnosis not present

## 2018-04-02 ENCOUNTER — Other Ambulatory Visit: Payer: Self-pay | Admitting: "Endocrinology

## 2018-04-02 DIAGNOSIS — E1165 Type 2 diabetes mellitus with hyperglycemia: Secondary | ICD-10-CM

## 2018-04-09 ENCOUNTER — Ambulatory Visit: Payer: Medicare Other | Admitting: "Endocrinology

## 2018-04-10 DIAGNOSIS — L259 Unspecified contact dermatitis, unspecified cause: Secondary | ICD-10-CM | POA: Diagnosis not present

## 2018-04-10 DIAGNOSIS — H61001 Unspecified perichondritis of right external ear: Secondary | ICD-10-CM | POA: Diagnosis not present

## 2018-04-10 DIAGNOSIS — L821 Other seborrheic keratosis: Secondary | ICD-10-CM | POA: Diagnosis not present

## 2018-04-21 ENCOUNTER — Encounter: Payer: Self-pay | Admitting: Orthopedic Surgery

## 2018-04-21 ENCOUNTER — Ambulatory Visit (INDEPENDENT_AMBULATORY_CARE_PROVIDER_SITE_OTHER): Payer: Medicare Other | Admitting: Orthopedic Surgery

## 2018-04-21 VITALS — BP 143/65 | HR 54 | Ht 65.0 in

## 2018-04-21 DIAGNOSIS — M25369 Other instability, unspecified knee: Secondary | ICD-10-CM

## 2018-04-21 DIAGNOSIS — G8929 Other chronic pain: Secondary | ICD-10-CM

## 2018-04-21 DIAGNOSIS — M1712 Unilateral primary osteoarthritis, left knee: Secondary | ICD-10-CM

## 2018-04-21 DIAGNOSIS — M25562 Pain in left knee: Secondary | ICD-10-CM

## 2018-04-21 DIAGNOSIS — M1711 Unilateral primary osteoarthritis, right knee: Secondary | ICD-10-CM | POA: Diagnosis not present

## 2018-04-21 DIAGNOSIS — M25561 Pain in right knee: Secondary | ICD-10-CM

## 2018-04-21 NOTE — Patient Instructions (Signed)

## 2018-04-21 NOTE — Progress Notes (Addendum)
Chief Complaint  Patient presents with  . Knee Pain    bilateral wants injection s   Procedure note right knee injection verbal consent was obtained to inject right knee joint  Timeout was completed to confirm the site of injection  The medications used were 40 mg of Depo-Medrol and 1% lidocaine 3 cc  Anesthesia was provided by ethyl chloride and the skin was prepped with alcohol.  After cleaning the skin with alcohol a 20-gauge needle was used to inject the right knee joint. There were no complications. A sterile bandage was applied.  Procedure note left knee injection verbal consent was obtained to inject left knee joint  Timeout was completed to confirm the site of injection  The medications used were 40 mg of Depo-Medrol and 1% lidocaine 3 cc  Anesthesia was provided by ethyl chloride and the skin was prepped with alcohol.  After cleaning the skin with alcohol a 20-gauge needle was used to inject the left knee joint. There were no complications. A sterile bandage was applied.     Encounter Diagnoses  Name Primary?  . Primary osteoarthritis of right knee Yes  . Primary osteoarthritis of left knee   . Chronic pain of both knees    [name] would like braces for her knees  She says that her knees give out she has trouble walking.  We offered her a economy hinged braces for support for instability and giving way

## 2018-04-25 DIAGNOSIS — B351 Tinea unguium: Secondary | ICD-10-CM | POA: Diagnosis not present

## 2018-04-25 DIAGNOSIS — E1142 Type 2 diabetes mellitus with diabetic polyneuropathy: Secondary | ICD-10-CM | POA: Diagnosis not present

## 2018-04-29 ENCOUNTER — Encounter: Payer: Self-pay | Admitting: Nutrition

## 2018-04-29 ENCOUNTER — Encounter: Payer: Medicare Other | Attending: "Endocrinology | Admitting: Nutrition

## 2018-04-29 VITALS — Ht 65.0 in | Wt 206.0 lb

## 2018-04-29 DIAGNOSIS — E1165 Type 2 diabetes mellitus with hyperglycemia: Secondary | ICD-10-CM | POA: Insufficient documentation

## 2018-04-29 DIAGNOSIS — E669 Obesity, unspecified: Secondary | ICD-10-CM

## 2018-04-29 DIAGNOSIS — Z713 Dietary counseling and surveillance: Secondary | ICD-10-CM | POA: Insufficient documentation

## 2018-04-29 DIAGNOSIS — E118 Type 2 diabetes mellitus with unspecified complications: Secondary | ICD-10-CM

## 2018-04-29 DIAGNOSIS — IMO0002 Reserved for concepts with insufficient information to code with codable children: Secondary | ICD-10-CM

## 2018-04-29 NOTE — Progress Notes (Signed)
Medical Nutrition Therapy:  Appt start time: 0800 end time:  0900   Assessment:  Primary concerns today: DIabetes Type 2. Lives with her husband. She sees Dr. Dorris Fetch. Had DM >10 yrs. Eats 3 meals per day. Limited mobility and walks with a cane. Can not stand for very long. Usually uses a walker at home.   Is on 30 units of Lantus and Januvia. She notes she doesn't think she has been taking her insulin correctly. BS are still 250-400's. She has cut out snacks and cut down on portion and hasn't seen any improvement in her BS.  Feels tired, thirsy, hungry, depressed slightly-frustrated mainly, frequent urination and dizziness.   Has been confused about how to take her insulin. She feels her BS are are a result of not getting her insulin. She wasn't pressing the button on the pen to dispense the insulin.    Diet is ok. Needs more lower carb vegetables and more consistency at meals.  Lab Results  Component Value Date   HGBA1C 10.2 (H) 02/20/2018     Preferred Learning Style:    No preference indicated   Learning Readiness:     Ready  Change in progress   MEDICATIONS: See list   DIETARY INTAKE:  24-hr recall:  B ( AM): 1 egg, water, fruit Snk ( AM):   L ( PM): toss salad, water, Snk ( PM): fruit D ( PM): PB on gram crackers., water, diet soda Snk ( PM): misc Beverages: water  Usual physical activity: ADL  Estimated energy needs: 1200  calories 135 g carbohydrates 90 g protein 33 g fat  Progress Towards Goal(s):  In progress.   Nutritional Diagnosis:  NB-1.1 Food and nutrition-related knowledge deficit As related to Diabetes Type 2.  As evidenced by A1C 10.2%..    Intervention:  Nutrition and Diabetes education provided on My Plate, CHO counting, meal planning, portion sizes, timing of meals, avoiding snacks between meals unless having a low blood sugar, target ranges for A1C and blood sugars, signs/symptoms and treatment of hyper/hypoglycemia, monitoring blood  sugars, taking medications as prescribed, benefits of exercising 30 minutes per day and prevention of complications of DM. Insulin Instruction  Insulin instruction.  The following learning objectives were met by the patient during this visit:   Insulin Action of 30 insulins of Lantus   VS pen including # units per syringe,    Hygiene and storage    Rotation of Sites  Hypoglycemia- symptoms, causes , treatment choices  Record keeping and MD follow up  Hypoglycemia, causes, symptoms and treatment              Hyperglycemia causes, symptoms and treatment  Patient demonstrated understanding of insulin administration by return demonstration.                                          Goals 1. Follow My Plate 2. Increase fresh fruits nad vegetalbes. Inject 30 units of Lantus  into abdomen area every evening before bed. Eat three meal per day Drink water Test blood sugar twice a day and call me with results in 1 week  Teaching Method Utilized:  Visual Auditory Hands on  Handouts given during visit include:  The Plate Method   Meal Plan Card  Diabetes Instructions   Barriers to learning/adherence to lifestyle change: Limited mobiiity  Demonstrated degree of understanding via:  Teach Back  Monitoring/Evaluation:  Dietary intake, exercise, meal planning, and body weight in 1 month(s).

## 2018-04-29 NOTE — Patient Instructions (Addendum)
Goals 1. Follow My Plate 2. Increase fresh fruits and  vegetalbes. Inject 30 units into abdomen area every evening before bed. Eat three meal per day Drink water Test blood sugar twice a day and call me with results in 1 week

## 2018-04-30 ENCOUNTER — Other Ambulatory Visit: Payer: Self-pay | Admitting: "Endocrinology

## 2018-06-04 ENCOUNTER — Other Ambulatory Visit: Payer: Self-pay | Admitting: "Endocrinology

## 2018-06-04 DIAGNOSIS — E118 Type 2 diabetes mellitus with unspecified complications: Secondary | ICD-10-CM | POA: Diagnosis not present

## 2018-06-04 DIAGNOSIS — E1165 Type 2 diabetes mellitus with hyperglycemia: Secondary | ICD-10-CM | POA: Diagnosis not present

## 2018-06-04 DIAGNOSIS — E1129 Type 2 diabetes mellitus with other diabetic kidney complication: Secondary | ICD-10-CM | POA: Diagnosis not present

## 2018-06-05 LAB — COMPREHENSIVE METABOLIC PANEL
ALT: 18 IU/L (ref 0–32)
AST: 18 IU/L (ref 0–40)
Albumin/Globulin Ratio: 1.5 (ref 1.2–2.2)
Albumin: 3.7 g/dL (ref 3.5–4.7)
Alkaline Phosphatase: 80 IU/L (ref 39–117)
BUN/Creatinine Ratio: 21 (ref 12–28)
BUN: 25 mg/dL (ref 8–27)
Bilirubin Total: 0.5 mg/dL (ref 0.0–1.2)
CO2: 25 mmol/L (ref 20–29)
Calcium: 9.8 mg/dL (ref 8.7–10.3)
Chloride: 104 mmol/L (ref 96–106)
Creatinine, Ser: 1.19 mg/dL — ABNORMAL HIGH (ref 0.57–1.00)
GFR calc Af Amer: 48 mL/min/{1.73_m2} — ABNORMAL LOW (ref >59)
GFR calc non Af Amer: 41 mL/min/{1.73_m2} — ABNORMAL LOW (ref >59)
Globulin, Total: 2.4 g/dL (ref 1.5–4.5)
Glucose: 65 mg/dL (ref 65–99)
Potassium: 4 mmol/L (ref 3.5–5.2)
Sodium: 142 mmol/L (ref 134–144)
Total Protein: 6.1 g/dL (ref 6.0–8.5)

## 2018-06-05 LAB — HGB A1C W/O EAG: Hgb A1c MFr Bld: 10.2 % — ABNORMAL HIGH (ref 4.8–5.6)

## 2018-06-05 LAB — SPECIMEN STATUS REPORT

## 2018-06-11 ENCOUNTER — Ambulatory Visit (INDEPENDENT_AMBULATORY_CARE_PROVIDER_SITE_OTHER): Payer: Medicare Other | Admitting: "Endocrinology

## 2018-06-11 ENCOUNTER — Encounter: Payer: Self-pay | Admitting: "Endocrinology

## 2018-06-11 ENCOUNTER — Encounter: Payer: Medicare Other | Attending: Family Medicine | Admitting: Nutrition

## 2018-06-11 VITALS — BP 148/82 | HR 57 | Ht 65.0 in | Wt 222.0 lb

## 2018-06-11 DIAGNOSIS — E782 Mixed hyperlipidemia: Secondary | ICD-10-CM

## 2018-06-11 DIAGNOSIS — E1165 Type 2 diabetes mellitus with hyperglycemia: Secondary | ICD-10-CM | POA: Insufficient documentation

## 2018-06-11 DIAGNOSIS — I10 Essential (primary) hypertension: Secondary | ICD-10-CM

## 2018-06-11 DIAGNOSIS — Z713 Dietary counseling and surveillance: Secondary | ICD-10-CM | POA: Diagnosis not present

## 2018-06-11 DIAGNOSIS — IMO0002 Reserved for concepts with insufficient information to code with codable children: Secondary | ICD-10-CM

## 2018-06-11 DIAGNOSIS — Z6830 Body mass index (BMI) 30.0-30.9, adult: Secondary | ICD-10-CM

## 2018-06-11 DIAGNOSIS — E6609 Other obesity due to excess calories: Secondary | ICD-10-CM

## 2018-06-11 DIAGNOSIS — E118 Type 2 diabetes mellitus with unspecified complications: Secondary | ICD-10-CM

## 2018-06-11 MED ORDER — INSULIN GLARGINE 100 UNIT/ML SOLOSTAR PEN: 40.0000 [IU] | PEN_INJECTOR | Freq: Every day | SUBCUTANEOUS | 2 refills | Status: DC

## 2018-06-11 MED ORDER — GLUCOSE BLOOD VI STRP: ORAL_STRIP | 3 refills | Status: DC

## 2018-06-11 NOTE — Patient Instructions (Signed)
Keep up the great job Keep drinking water Eat more fresh fruits and vegetables Get A1C less than 8%

## 2018-06-11 NOTE — Patient Instructions (Signed)

## 2018-06-11 NOTE — Progress Notes (Signed)
  Medical Nutrition Therapy:  Appt start time: 1400 end time: 1430  Assessment:  Primary concerns today: DIabetes Type 2. Lives with her husband. She sees Dr. Dorris Fetch. Had DM >10 yrs.   Increased insulin to 40 mg/dl and stopped Glipizide.. Changes: Says her BS are better.  Uses a stepper for some exercises. Has been cutting out snacks, eating more fresh fruits and vegetables. BS are 150-200's now. Had a few episodes of what felt like low blood sugars and was craving snacks. This should improve now that she is off Glipizide.  She is now taking her insulin correctly and sees improvement in her BS. Not able to exercise much due to leg and back issues. Walks with a cane.  Lab Results  Component Value Date   HGBA1C 10.2 (H) 06/04/2018    Preferred Learning Style:    No preference indicated   Learning Readiness:     Ready  Change in progress   MEDICATIONS: See list   DIETARY INTAKE:  24-hr recall:  B ( AM): Toast and egg and water  Snk ( AM):   L ( PM): Ham, green beans, and corn, water canteloupe Snk ( PM): fruit D ( PM): BLT.  Snk ( PM):  Beverages: water  Usual physical activity: ADL  Estimated energy needs: 1200  calories 135 g carbohydrates 90 g protein 33 g fat  Progress Towards Goal(s):  In progress.   Nutritional Diagnosis:  NB-1.1 Food and nutrition-related knowledge deficit As related to Diabetes Type 2.  As evidenced by A1C 10.2%..    Intervention:  Nutrition and Diabetes education provided on My Plate, CHO counting, meal planning, portion sizes, timing of meals, avoiding snacks between meals unless having a low blood sugar, target ranges for A1C and blood sugars, signs/symptoms and treatment of hyper/hypoglycemia, monitoring blood sugars, taking medications as prescribed, benefits of exercising 30 minutes per day and prevention of complications of DM.                                        Keep up the great job Keep drinking water Eat more fresh fruits and  vegetables Get A1C less than 8%   Teaching Method Utilized:  Visual Auditory Hands on  Handouts given during visit include:  The Plate Method   Meal Plan Card  Diabetes Instructions   Barriers to learning/adherence to lifestyle change: Limited mobiiity  Demonstrated degree of understanding via:  Teach Back   Monitoring/Evaluation:  Dietary intake, exercise, meal planning, and body weight in 3 month(s).

## 2018-06-11 NOTE — Progress Notes (Signed)
Subjective:    Patient ID: [name], female    DOB: [birth year],    Past Medical History:  Diagnosis Date  . Diabetes mellitus without complication ([place])   . Diabetic neuropathy ([place])   . Hypertension   . Urinary incontinence    Past Surgical History:  Procedure Laterality Date  . ABDOMINAL HYSTERECTOMY    . APPENDECTOMY    . BREAST SURGERY     begnin tumor removed  . CATARACT EXTRACTION, BILATERAL     Social History   Socioeconomic History  . Marital status: Married    Spouse name: Not on file  . Number of children: Not on file  . Years of education: Not on file  . Highest education level: Not on file  Occupational History  . Occupation: retired    Comment: [employer]  . Financial resource strain: Not on file  . Food insecurity:    Worry: Not on file    Inability: Not on file  . Transportation needs:    Medical: Not on file    Non-medical: Not on file  Tobacco Use  . Smoking status: Never Smoker  . Smokeless tobacco: Never Used  Substance and Sexual Activity  . Alcohol use: No  . Drug use: No  . Sexual activity: Not on file  Lifestyle  . Physical activity:    Days per week: Not on file    Minutes per session: Not on file  . Stress: Not on file  Relationships  . Social connections:    Talks on phone: Not on file    Gets together: Not on file    Attends religious service: Not on file    Active member of club or organization: Not on file    Attends meetings of clubs or organizations: Not on file    Relationship status: Not on file  Other Topics Concern  . Not on file  Social History Narrative  . Not on file   Outpatient Encounter Medications as of 06/11/2018  Medication Sig  . Cholecalciferol (VITAMIN D3) 5000 units CAPS Take 1 capsule (5,000 Units total) by mouth daily.  . clonazePAM (KLONOPIN) 0.5 MG tablet Take 1 tablet (0.5 mg total) by mouth daily.  . furosemide (LASIX) 20 MG tablet Take 20 mg by mouth  daily as needed (for fluid retention).   . gabapentin (NEURONTIN) 300 MG capsule Take 1 capsule (300 mg total) by mouth 3 (three) times daily.  [name] glucose blood (ONETOUCH VERIO) test strip Use as instructed tid. E11.65  . Insulin Glargine (LANTUS SOLOSTAR) 100 UNIT/ML Solostar Pen Inject 40 Units into the skin daily at 10 pm.  . Insulin Pen Needle (B-D ULTRAFINE III SHORT PEN) 31G X 8 MM MISC 1 each by Does not apply route as directed.  [name] JANUVIA 50 MG tablet TAKE 1 TABLET BY MOUTH  DAILY  . lisinopril (PRINIVIL,ZESTRIL) 20 MG tablet Take 20 mg by mouth 2 (two) times daily.  . metoprolol succinate (TOPROL-XL) 50 MG 24 hr tablet Take 50 mg by mouth daily. Take with or immediately following a meal.  . MYRBETRIQ 50 MG TB24 tablet Take 50 mg by mouth daily.  . simvastatin (ZOCOR) 20 MG tablet Take 20 mg by mouth every evening.  [name] tiZANidine (ZANAFLEX) 2 MG tablet Take by mouth every 6 (six) hours as needed for muscle spasms.  . traMADol (ULTRAM) 50 MG tablet Take 1 tablet (50 mg total) by mouth every 6 (six) hours as  needed.  . zolpidem (AMBIEN) 10 MG tablet Take 10 mg by mouth at bedtime as needed for sleep.  . [DISCONTINUED] glucose blood (ONETOUCH VERIO) test strip Use as instructed tid. E11.65  . [DISCONTINUED] Insulin Glargine (LANTUS SOLOSTAR) 100 UNIT/ML Solostar Pen Inject 30 Units into the skin daily at 10 pm.   No facility-administered encounter medications on file as of 06/11/2018.    ALLERGIES: Allergies  Allergen Reactions  . Codeine Nausea And Vomiting   VACCINATION STATUS:  There is no immunization history on file for this patient.  Diabetes  She presents for her follow-up diabetic visit. She has type 2 diabetes mellitus. Onset time: She was diagnosed at approximate age of 47 years. Her disease course has been stable. There are no hypoglycemic associated symptoms. Pertinent negatives for hypoglycemia include no confusion, headaches, pallor or seizures. Associated symptoms include  fatigue. Pertinent negatives for diabetes include no chest pain, no polydipsia, no polyphagia and no polyuria. There are no hypoglycemic complications. Symptoms are stable. There are no diabetic complications. Risk factors for coronary artery disease include dyslipidemia, diabetes mellitus and hypertension. Current diabetic treatment includes oral agent (dual therapy). Her weight is increasing steadily. She is following a generally unhealthy diet. She has had a previous visit with a dietitian. Her home blood glucose trend is increasing steadily. Her breakfast blood glucose range is generally >200 mg/dl. Her bedtime blood glucose range is generally >200 mg/dl. Her overall blood glucose range is >200 mg/dl. An ACE inhibitor/angiotensin II receptor blocker is being taken.  Hyperlipidemia  This is a chronic problem. The current episode started more than 1 year ago. Exacerbating diseases include diabetes and obesity. Pertinent negatives include no chest pain, myalgias or shortness of breath. Current antihyperlipidemic treatment includes statins. Risk factors for coronary artery disease include diabetes mellitus, dyslipidemia, hypertension, a sedentary lifestyle, post-menopausal and obesity.  Hypertension  This is a chronic problem. The current episode started more than 1 year ago. The problem is controlled. Pertinent negatives include no chest pain, headaches, palpitations or shortness of breath. Risk factors for coronary artery disease include dyslipidemia, diabetes mellitus, family history, obesity, sedentary lifestyle and post-menopausal state. Past treatments include ACE inhibitors. The current treatment provides moderate improvement.    Review of Systems  Constitutional: Positive for fatigue. Negative for unexpected weight change.  HENT: Negative for trouble swallowing and voice change.   Eyes: Negative for visual disturbance.  Respiratory: Negative for cough, shortness of breath and wheezing.    Cardiovascular: Negative for chest pain, palpitations and leg swelling.  Gastrointestinal: Negative for diarrhea, nausea and vomiting.  Endocrine: Negative for cold intolerance, heat intolerance, polydipsia, polyphagia and polyuria.  Musculoskeletal: Positive for gait problem. Negative for arthralgias and myalgias.  Skin: Negative for color change, pallor, rash and wound.  Neurological: Negative for seizures and headaches.  Psychiatric/Behavioral: Negative for confusion and suicidal ideas.    Objective:    BP (!) 148/82   Pulse (!) 57   Ht 5\' 5"  (1.651 m)   Wt 222 lb (100.7 kg)   BMI 36.94 kg/m   Wt Readings from Last 3 Encounters:  06/11/18 222 lb (100.7 kg)  04/29/18 206 lb (93.4 kg)  03/10/18 215 lb (97.5 kg)    Physical Exam  Constitutional: She is oriented to person, place, and time. She appears well-developed.  Walks with a cane.  HENT:  Head: Normocephalic and atraumatic.  Eyes: EOM are normal.  Neck: Normal range of motion. Neck supple. No tracheal deviation present. No thyromegaly present.  Cardiovascular: Normal rate.  Pulmonary/Chest: Effort normal.  Abdominal: Bowel sounds are normal. There is no tenderness. There is no guarding.  Musculoskeletal: She exhibits no edema.  Walks with a cane.  Neurological: She is alert and oriented to person, place, and time. She has normal reflexes. No cranial nerve deficit. Coordination normal.  Skin: Skin is warm and dry. No rash noted. No erythema. No pallor.  Psychiatric: She has a normal mood and affect. Judgment normal.    Results for orders placed or performed in visit on 06/04/18  Comprehensive metabolic panel  Result Value Ref Range   Glucose 65 65 - 99 mg/dL   BUN 25 8 - 27 mg/dL   Creatinine, Ser 1.19 (H) 0.57 - 1.00 mg/dL   GFR calc non Af Amer 41 (L) >59 mL/min/1.73   GFR calc Af Amer 48 (L) >59 mL/min/1.73   BUN/Creatinine Ratio 21 12 - 28   Sodium 142 134 - 144 mmol/L   Potassium 4.0 3.5 - 5.2 mmol/L    Chloride 104 96 - 106 mmol/L   CO2 25 20 - 29 mmol/L   Calcium 9.8 8.7 - 10.3 mg/dL   Total Protein 6.1 6.0 - 8.5 g/dL   Albumin 3.7 3.5 - 4.7 g/dL   Globulin, Total 2.4 1.5 - 4.5 g/dL   Albumin/Globulin Ratio 1.5 1.2 - 2.2   Bilirubin Total 0.5 0.0 - 1.2 mg/dL   Alkaline Phosphatase 80 39 - 117 IU/L   AST 18 0 - 40 IU/L   ALT 18 0 - 32 IU/L  Hgb A1c w/o eAG  Result Value Ref Range   Hgb A1c MFr Bld 10.2 (H) 4.8 - 5.6 %  Specimen status report  Result Value Ref Range   specimen status report Comment    Diabetic Labs (most recent): Lab Results  Component Value Date   HGBA1C 10.2 (H) 06/04/2018   HGBA1C 10.2 (H) 02/20/2018   HGBA1C 9.1 (H) 11/13/2017     Assessment & Plan:   1. Uncontrolled type 2 diabetes mellitus with CKD as a complication, without long-term current use of insulin (HCC)  - Her recent A1c remains high at 10.2%. -She does not report or documented hypoglycemia.   - She admits to dietary indiscretion.  - Patient remains at a high risk for more acute and chronic complications of diabetes which include CAD, CVA, CKD, retinopathy, and neuropathy. These are all discussed in detail with the patient.  - I have re-counseled the patient on diet management and weight loss  by adopting a carbohydrate restricted / protein rich  Diet. - Patient is advised to stick to a routine mealtimes to eat 3 meals  a day and avoid unnecessary snacks ( to snack only to correct hypoglycemia).  -  Suggestion is made for her to avoid simple carbohydrates  from her diet including Cakes, Sweet Desserts / Pastries, Ice Cream, Soda (diet and regular), Sweet Tea, Candies, Chips, Cookies, Store Bought Juices, Alcohol in Excess of  1-2 drinks a day, Artificial Sweeteners, and "Sugar-free" Products. This will help patient to have stable blood glucose profile and potentially avoid unintended weight gain.  - I have approached patient with the following individualized plan to manage diabetes and  patient agrees.   -Due to her presentation with significantly above target blood glucose despite a low-dose basal insulin and her recent A1c of 10.2%, she is approached for a higher dose of insulin.    -I discussed and increased her Lantus to 40  units nightly,  associated with strict monitoring of blood glucose 2 times a day-before breakfast and at bedtime.   -Insulin injection technique has been demonstrated for her in the exam room by me. -I advised her to continue Januvia 50 mg p.o. daily with breakfast.  - I advised her to discontinue glimepiride.   -Patient is encouraged to call clinic for blood glucose levels less than 70 or above 300 mg /dlx3.  - Patient specific target  for A1c; LDL, HDL, Triglycerides, and  Waist Circumference were discussed in detail.  2) BP/HTN: Her blood pressure is controlled to target.    She has enough blood pressure medications including lisinopril 20 mg p.o. twice daily.    3) Lipids/HPL: Her LDL is controlled at 48, has been taking simvastatin 20 mg p.o. daily for several years.  This medication will be stopped for during her next visit.   4)  Weight/Diet: CDE consult in progress, exercise, and carbohydrates information provided.  5)  Vitamin D deficiency - She is  s/p 12 weeks of  vitamin D therapy, 50,000 units weekly x 12 weeks.  5) Chronic Care/Health Maintenance:  -Patien is on ACEI and Statin medications and encouraged to continue to follow up with Ophthalmology, Podiatrist at least yearly or according to recommendations, and advised to stay away from smoking. I have recommended yearly flu vaccine and pneumonia vaccination at least every 5 years; and  sleep for at least 7 hours a day.  I advised patient to maintain close follow up with her PCP for primary care needs.   - Time spent with the patient: 25 min, of which >50% was spent in reviewing her blood glucose logs , discussing her hypo- and hyper-glycemic episodes, reviewing her current and   previous labs and insulin doses and developing a plan to avoid hypo- and hyper-glycemia. Please refer to Patient Instructions for Blood Glucose Monitoring and Insulin/Medications Dosing Guide"  in media tab for additional information. [name] participated in the discussions, expressed understanding, and voiced agreement with the above plans.  All questions were answered to her satisfaction. she is encouraged to contact clinic should she have any questions or concerns prior to her return visit. Follow up plan: Return in about 3 months (around 09/11/2018) for follow up with pre-visit labs, meter, and logs.  Glade Lloyd, MD Phone: [phone]  Fax: [phone]  -  This note was partially dictated with voice recognition software. Similar sounding words can be transcribed inadequately or may not  be corrected upon review.  06/11/2018, 2:18 PM

## 2018-06-16 ENCOUNTER — Encounter: Payer: Self-pay | Admitting: Nutrition

## 2018-07-04 DIAGNOSIS — B351 Tinea unguium: Secondary | ICD-10-CM | POA: Diagnosis not present

## 2018-07-04 DIAGNOSIS — E1142 Type 2 diabetes mellitus with diabetic polyneuropathy: Secondary | ICD-10-CM | POA: Diagnosis not present

## 2018-07-08 DIAGNOSIS — Z1231 Encounter for screening mammogram for malignant neoplasm of breast: Secondary | ICD-10-CM | POA: Diagnosis not present

## 2018-07-08 NOTE — Progress Notes (Signed)
[name] was seen today in the movement disorders clinic for neurologic consultation at the request of Sharilyn Sites, MD.  The consultation is for the evaluation of tremor.  Pt states that when she stands up for "a little while" she will start shaking.  She will note she will have to sit down.  When asked if it is tremor or weakness she states that she is not sure which it is.  If she stays standing up "I'm afraid I would fall."  It never happens when seated.  She isn't sure if other people can see it but thinks that perhaps they could.  It doesn't happen at any particular time of day.  She isn't sure if eating would help (she is diabetic).  She has done PT and just finished this about 3-4 weeks ago and it didn't seem to help.  If she is walking she is good; the issue becomes if she is standing stationary.  She doesn't feel lightheaded when it happens.  She has xanax on her list but states that she has not had that in 2 years and only took it for sleep.  Uses ambien now.    07/16/17 update:  Pt f/u today.  She was started on low dose klonopin.  After she started that, she gave [place] feedback that it had no SE but wasn't helping and tremor was so bad that she wasn't able to stand for mammogram.  We cautiously increased this to one tablet daily.  She states that it is helping without side effects.  She did run out of it a few days ago. She did have a fall but it was related to her knees giving out; she went to ortho and got an injection in her knees and she is hoping it will help her.  She is having trouble getting to sleep at night.  No sleeping during day.  She takes unisom nightly.  She tried melatonin 12 mg without relief.  She is on gabapentin 300 mg tid for neuropathy.  She takes zanaflex but in the AM.    01/16/18 update:  Pt seen in f/u.  On klonopin for orthostatic tremor and doing well, without SE.  Orthostatic tremor was well controlled.  She was told last visit to try and see if changing  her zanaflex to bedtime to see if that helped her insomnia.  She reports that this helped.  The records that were made available to me were reviewed.  Saw endocrinology.  Has uncontrolled DM with A1C of 9.1.  She states that she is working on that.    07/09/18 update: Patient is seen today in follow-up for orthostatic tremor.  She is on low-dose clonazepam.  This seems to help and she has not had side effects.  Records have been reviewed since our last visit.  Her diabetes remains under poor control with an A1c of 10.2.  She has seen the dietitian.  No falls since our last visit.  Usually uses the walker with ambulation.  No sleeping during the day.    ALLERGIES:   Allergies  Allergen Reactions  . Codeine Nausea And Vomiting    CURRENT MEDICATIONS:  Outpatient Encounter Medications as of 07/09/2018  Medication Sig  . Cholecalciferol (VITAMIN D3) 5000 units CAPS Take 1 capsule (5,000 Units total) by mouth daily.  . clonazePAM (KLONOPIN) 0.5 MG tablet Take 1 tablet (0.5 mg total) by mouth daily.  . furosemide (LASIX) 20 MG tablet Take 20 mg by  mouth daily as needed (for fluid retention).   . gabapentin (NEURONTIN) 300 MG capsule Take 1 capsule (300 mg total) by mouth 3 (three) times daily.  [name] glucose blood (ONETOUCH VERIO) test strip Use as instructed tid. E11.65  . Insulin Glargine (LANTUS SOLOSTAR) 100 UNIT/ML Solostar Pen Inject 40 Units into the skin daily at 10 pm.  . Insulin Pen Needle (B-D ULTRAFINE III SHORT PEN) 31G X 8 MM MISC 1 each by Does not apply route as directed.  [name] JANUVIA 50 MG tablet TAKE 1 TABLET BY MOUTH  DAILY  . lisinopril (PRINIVIL,ZESTRIL) 20 MG tablet Take 20 mg by mouth 2 (two) times daily.  . metoprolol succinate (TOPROL-XL) 50 MG 24 hr tablet Take 50 mg by mouth daily. Take with or immediately following a meal.  . MYRBETRIQ 50 MG TB24 tablet Take 50 mg by mouth daily.  . simvastatin (ZOCOR) 20 MG tablet Take 20 mg by mouth every evening.  [name] tiZANidine (ZANAFLEX) 2  MG tablet Take by mouth every 6 (six) hours as needed for muscle spasms.  . traMADol (ULTRAM) 50 MG tablet Take 1 tablet (50 mg total) by mouth every 6 (six) hours as needed.  . zolpidem (AMBIEN) 10 MG tablet Take 10 mg by mouth at bedtime as needed for sleep.   No facility-administered encounter medications on file as of 07/09/2018.     PAST MEDICAL HISTORY:   Past Medical History:  Diagnosis Date  . Diabetes mellitus without complication ([place])   . Diabetic neuropathy ([place])   . Hypertension   . Urinary incontinence     PAST SURGICAL HISTORY:   Past Surgical History:  Procedure Laterality Date  . ABDOMINAL HYSTERECTOMY    . APPENDECTOMY    . BREAST SURGERY     begnin tumor removed  . CATARACT EXTRACTION, BILATERAL      SOCIAL HISTORY:   Social History   Socioeconomic History  . Marital status: Married    Spouse name: Not on file  . Number of children: Not on file  . Years of education: Not on file  . Highest education level: Not on file  Occupational History  . Occupation: retired    Comment: [employer]  . Financial resource strain: Not on file  . Food insecurity:    Worry: Not on file    Inability: Not on file  . Transportation needs:    Medical: Not on file    Non-medical: Not on file  Tobacco Use  . Smoking status: Never Smoker  . Smokeless tobacco: Never Used  Substance and Sexual Activity  . Alcohol use: No  . Drug use: No  . Sexual activity: Not on file  Lifestyle  . Physical activity:    Days per week: Not on file    Minutes per session: Not on file  . Stress: Not on file  Relationships  . Social connections:    Talks on phone: Not on file    Gets together: Not on file    Attends religious service: Not on file    Active member of club or organization: Not on file    Attends meetings of clubs or organizations: Not on file    Relationship status: Not on file  . Intimate partner violence:    Fear of current or ex partner:  Not on file    Emotionally abused: Not on file    Physically abused: Not on file    Forced sexual activity: Not on file  Other Topics Concern  . Not on file  Social History Narrative  . Not on file    FAMILY HISTORY:   Family Status  Relation Name Status  . Mother  Deceased  . Father  Deceased  . Brother  Deceased  . Brother  Deceased  . Child 2 Alive    ROS:  A complete 10 system review of systems was obtained and was unremarkable apart from what is mentioned above.  PHYSICAL EXAMINATION:    VITALS:   Vitals:   07/09/18 1114  BP: 128/64  Pulse: (!) 56  SpO2: 92%    Gen:  Appears stated age and in NAD. HEENT:  Normocephalic, atraumatic. The mucous membranes are moist. The superficial temporal arteries are without ropiness or tenderness. Cardiovascular: brady.  regular Lungs: Clear to auscultation bilaterally. Neck: There are no carotid bruits noted bilaterally.  NEUROLOGICAL:  Orientation:  The patient is alert and oriented x 3.   Cranial nerves: There is good facial symmetry.  Extraocular muscles are intact and visual fields are full to confrontational testing. Speech is fluent and clear. Soft palate rises symmetrically and there is no tongue deviation. Hearing is intact to conversational tone. Tone: Tone is good throughout. Sensation: Sensation is intact to light touch x 4 Coordination:  The patient has no difficulty with RAM's or FNF bilaterally. Motor: Strength is at least antigravity x 4.      Movement examination: Tone: There is normal tone in the bilateral upper extremities.  The tone in the lower extremities is normal.  Abnormal movements: There is no tremor today.   Coordination:  There is no decremation with RAM's, with any form of RAMS, including alternating supination and pronation of the forearm, hand opening and closing, finger taps, heel taps and toe taps. Gait and Station: in [facility] and did not walk her  Labs:    Chemistry      Component Value  Date/Time   NA 142 06/04/2018 0932   K 4.0 06/04/2018 0932   CL 104 06/04/2018 0932   CO2 25 06/04/2018 0932   BUN 25 06/04/2018 0932   CREATININE 1.19 (H) 06/04/2018 0932      Component Value Date/Time   CALCIUM 9.8 06/04/2018 0932   ALKPHOS 80 06/04/2018 0932   AST 18 06/04/2018 0932   ALT 18 06/04/2018 0932   BILITOT 0.5 06/04/2018 0932     No results found for: TSH  Lab Results  Component Value Date   HGBA1C 10.2 (H) 06/04/2018   Lab Results  Component Value Date   WBC 13.3 (H) 09/07/2009   HGB 11.2 (L) 09/07/2009   HCT 33.3 (L) 09/07/2009   MCV 92.6 09/07/2009   PLT 222 09/07/2009     ASSESSMENT/PLAN:  1.  Orthostatic Tremor  -Discussed with the patient the nature and etiology.  Discussed with her that this can be very resistant to treatment.  Klonopin has helped and has not had SE.  We have been cautious with this given age.  She is on 0.5 mg daily.  Will try to decrease to 0.25 mg in the AM and see if still does well.  2.  Uncontrolled DM with significant diabetic peripheral neuropathy.  -seeing endocrinology.  A1C was still high.  Seeing dietician  -This also certainly can affect gait and balance.  We discussed safety associated with peripheral neuropathy.  We discussed balance therapy and the importance of ambulatory assistive device for balance assistance.  3.  F/u 6-7 months  Cc:  Sharilyn Sites, MD

## 2018-07-09 ENCOUNTER — Ambulatory Visit (INDEPENDENT_AMBULATORY_CARE_PROVIDER_SITE_OTHER): Payer: Medicare Other | Admitting: Neurology

## 2018-07-09 ENCOUNTER — Encounter: Payer: Self-pay | Admitting: Neurology

## 2018-07-09 VITALS — BP 128/64 | HR 56

## 2018-07-09 DIAGNOSIS — G252 Other specified forms of tremor: Secondary | ICD-10-CM

## 2018-07-09 NOTE — Patient Instructions (Signed)
Try to just take 1/2 tablet in the AM of the klonopin instead of a full tablet.

## 2018-07-11 ENCOUNTER — Other Ambulatory Visit: Payer: Self-pay

## 2018-07-11 MED ORDER — GABAPENTIN 300 MG PO CAPS: 300.0000 mg | ORAL_CAPSULE | Freq: Three times a day (TID) | ORAL | 2 refills | Status: DC

## 2018-07-29 DIAGNOSIS — N311 Reflex neuropathic bladder, not elsewhere classified: Secondary | ICD-10-CM | POA: Diagnosis not present

## 2018-07-30 DIAGNOSIS — E6609 Other obesity due to excess calories: Secondary | ICD-10-CM | POA: Diagnosis not present

## 2018-07-30 DIAGNOSIS — E1165 Type 2 diabetes mellitus with hyperglycemia: Secondary | ICD-10-CM | POA: Diagnosis not present

## 2018-07-30 DIAGNOSIS — Z6830 Body mass index (BMI) 30.0-30.9, adult: Secondary | ICD-10-CM | POA: Diagnosis not present

## 2018-07-30 DIAGNOSIS — L089 Local infection of the skin and subcutaneous tissue, unspecified: Secondary | ICD-10-CM | POA: Diagnosis not present

## 2018-07-30 DIAGNOSIS — Z1389 Encounter for screening for other disorder: Secondary | ICD-10-CM | POA: Diagnosis not present

## 2018-07-31 ENCOUNTER — Other Ambulatory Visit: Payer: Self-pay | Admitting: "Endocrinology

## 2018-09-03 ENCOUNTER — Encounter: Payer: Self-pay | Admitting: Orthopedic Surgery

## 2018-09-03 ENCOUNTER — Ambulatory Visit (INDEPENDENT_AMBULATORY_CARE_PROVIDER_SITE_OTHER): Payer: Medicare Other | Admitting: Orthopedic Surgery

## 2018-09-03 VITALS — BP 137/68 | HR 54 | Ht 65.0 in

## 2018-09-03 DIAGNOSIS — M25561 Pain in right knee: Secondary | ICD-10-CM | POA: Diagnosis not present

## 2018-09-03 DIAGNOSIS — M1711 Unilateral primary osteoarthritis, right knee: Secondary | ICD-10-CM

## 2018-09-03 DIAGNOSIS — M25562 Pain in left knee: Secondary | ICD-10-CM

## 2018-09-03 DIAGNOSIS — G8929 Other chronic pain: Secondary | ICD-10-CM

## 2018-09-03 DIAGNOSIS — M1712 Unilateral primary osteoarthritis, left knee: Secondary | ICD-10-CM | POA: Diagnosis not present

## 2018-09-03 NOTE — Progress Notes (Signed)
Chief Complaint  Patient presents with  . Knee Pain    bilateral   . Injections    wants injections    Encounter Diagnoses  Name Primary?  . Primary osteoarthritis of right knee Yes  . Primary osteoarthritis of left knee   . Chronic pain of both knees      Procedure note left knee injection verbal consent was obtained to inject left knee joint  Timeout was completed to confirm the site of injection  The medications used were 40 mg of Depo-Medrol and 1% lidocaine 3 cc  Anesthesia was provided by ethyl chloride and the skin was prepped with alcohol.  After cleaning the skin with alcohol a 20-gauge needle was used to inject the left knee joint. There were no complications. A sterile bandage was applied.   Procedure note right knee injection verbal consent was obtained to inject right knee joint  Timeout was completed to confirm the site of injection  The medications used were 40 mg of Depo-Medrol and 1% lidocaine 3 cc  Anesthesia was provided by ethyl chloride and the skin was prepped with alcohol.  After cleaning the skin with alcohol a 20-gauge needle was used to inject the right knee joint. There were no complications. A sterile bandage was applied.   FU 3 MONTHS

## 2018-09-11 ENCOUNTER — Ambulatory Visit: Payer: Medicare Other | Admitting: "Endocrinology

## 2018-09-11 ENCOUNTER — Encounter: Payer: Medicare Other | Admitting: Nutrition

## 2018-09-16 DIAGNOSIS — B351 Tinea unguium: Secondary | ICD-10-CM | POA: Diagnosis not present

## 2018-09-16 DIAGNOSIS — E1142 Type 2 diabetes mellitus with diabetic polyneuropathy: Secondary | ICD-10-CM | POA: Diagnosis not present

## 2018-09-25 ENCOUNTER — Other Ambulatory Visit: Payer: Self-pay | Admitting: "Endocrinology

## 2018-09-25 DIAGNOSIS — E1165 Type 2 diabetes mellitus with hyperglycemia: Secondary | ICD-10-CM | POA: Diagnosis not present

## 2018-09-26 LAB — COMPREHENSIVE METABOLIC PANEL
ALT: 15 IU/L (ref 0–32)
AST: 15 IU/L (ref 0–40)
Albumin/Globulin Ratio: 1.5 (ref 1.2–2.2)
Albumin: 3.7 g/dL (ref 3.5–4.7)
Alkaline Phosphatase: 89 IU/L (ref 39–117)
BUN/Creatinine Ratio: 20 (ref 12–28)
BUN: 24 mg/dL (ref 8–27)
Bilirubin Total: 0.6 mg/dL (ref 0.0–1.2)
CO2: 27 mmol/L (ref 20–29)
Calcium: 9.7 mg/dL (ref 8.7–10.3)
Chloride: 98 mmol/L (ref 96–106)
Creatinine, Ser: 1.2 mg/dL — ABNORMAL HIGH (ref 0.57–1.00)
GFR calc Af Amer: 47 mL/min/{1.73_m2} — ABNORMAL LOW (ref >59)
GFR calc non Af Amer: 41 mL/min/{1.73_m2} — ABNORMAL LOW (ref >59)
Globulin, Total: 2.5 g/dL (ref 1.5–4.5)
Glucose: 90 mg/dL (ref 65–99)
Potassium: 4.4 mmol/L (ref 3.5–5.2)
Sodium: 142 mmol/L (ref 134–144)
Total Protein: 6.2 g/dL (ref 6.0–8.5)

## 2018-09-26 LAB — SPECIMEN STATUS REPORT

## 2018-09-26 LAB — HGB A1C W/O EAG: Hgb A1c MFr Bld: 8.8 % — ABNORMAL HIGH (ref 4.8–5.6)

## 2018-10-02 ENCOUNTER — Encounter: Payer: Self-pay | Admitting: "Endocrinology

## 2018-10-02 ENCOUNTER — Encounter: Payer: Medicare Other | Attending: Family Medicine | Admitting: Nutrition

## 2018-10-02 ENCOUNTER — Ambulatory Visit (INDEPENDENT_AMBULATORY_CARE_PROVIDER_SITE_OTHER): Payer: Medicare Other | Admitting: "Endocrinology

## 2018-10-02 VITALS — BP 136/84 | HR 53 | Ht 65.0 in | Wt 225.0 lb

## 2018-10-02 DIAGNOSIS — E1165 Type 2 diabetes mellitus with hyperglycemia: Secondary | ICD-10-CM | POA: Diagnosis not present

## 2018-10-02 DIAGNOSIS — I10 Essential (primary) hypertension: Secondary | ICD-10-CM | POA: Diagnosis not present

## 2018-10-02 DIAGNOSIS — E782 Mixed hyperlipidemia: Secondary | ICD-10-CM | POA: Diagnosis not present

## 2018-10-02 MED ORDER — GLUCOSE BLOOD VI STRP: ORAL_STRIP | 3 refills | Status: DC

## 2018-10-02 MED ORDER — INSULIN GLARGINE 100 UNIT/ML SOLOSTAR PEN: 40.0000 [IU] | PEN_INJECTOR | Freq: Every day | SUBCUTANEOUS | 2 refills | Status: DC

## 2018-10-02 NOTE — Patient Instructions (Signed)

## 2018-10-02 NOTE — Progress Notes (Signed)
  Medical Nutrition Therapy:  Appt start time: 1400 end time: 1430  Assessment:  Primary concerns today: DIabetes Type 2. Lives with her husband. She sees Dr. Dorris Fetch. Had DM >10 yrs.   Increased insulin to 40 mg/dl and stopped Glipizide.. Changes: Says her BS are better.  Uses a stepper for some exercises. Has been cutting out snacks, eating more fresh fruits and vegetables. BS are 150-200's now. Had a few episodes of what felt like low blood sugars and was craving snacks. This should improve now that she is off Glipizide.  She is now taking her insulin correctly and sees improvement in her BS. Not able to exercise much due to leg and back issues. Walks with a cane.  Had some steroid injections for her back and hip. Vitals with BMI 10/02/2018  Height 5\' 5"   Weight 225 lbs  BMI 28.36  Systolic 629  Diastolic 84  Pulse 53  Respirations     Lab Results  Component Value Date   HGBA1C 8.8 (H) 09/25/2018    Preferred Learning Style:    No preference indicated   Learning Readiness:     Ready  Change in progress   MEDICATIONS: See list   DIETARY INTAKE:  24-hr recall:  B ( AM): Toast and egg and water  Snk ( AM):   L ( PM): Ham, green beans, and corn, water canteloupe Snk ( PM): fruit D ( PM): BLT.  Snk ( PM):  Beverages: water  Usual physical activity: ADL  Estimated energy needs: 1200  calories 135 g carbohydrates 90 g protein 33 g fat  Progress Towards Goal(s):  In progress.   Nutritional Diagnosis:  NB-1.1 Food and nutrition-related knowledge deficit As related to Diabetes Type 2.  As evidenced by A1C 10.2%..    Intervention:  Nutrition and Diabetes education provided on My Plate, CHO counting, meal planning, portion sizes, timing of meals, avoiding snacks between meals unless having a low blood sugar, target ranges for A1C and blood sugars, signs/symptoms and treatment of hyper/hypoglycemia, monitoring blood sugars, taking medications as prescribed, benefits of  exercising 30 minutes per day and prevention of complications of DM.                                        Goals 1. Get BS before bed less than 150 mg/dl. Don't eat past 7 pm Use exercise bike 15 minutes  A day Cut out snacks Drink only water. Get A1C down to 7%  Lose 5-10 lbs in the next 3-5 months.  Teaching Method Utilized:  Visual Auditory Hands on  Handouts given during visit include:  The Plate Method   Meal Plan Card  Diabetes Instructions   Barriers to learning/adherence to lifestyle change: Limited mobiiity  Demonstrated degree of understanding via:  Teach Back   Monitoring/Evaluation:  Dietary intake, exercise, meal planning, and body weight in 3 month(s).

## 2018-10-02 NOTE — Progress Notes (Signed)
Endocrinology follow-up note  Subjective:    Patient ID: [name], female    DOB: 1929-07-31,    Past Medical History:  Diagnosis Date  . Diabetes mellitus without complication ([place])   . Diabetic neuropathy ([place])   . Hypertension   . Urinary incontinence    Past Surgical History:  Procedure Laterality Date  . ABDOMINAL HYSTERECTOMY    . APPENDECTOMY    . BREAST SURGERY     begnin tumor removed  . CATARACT EXTRACTION, BILATERAL     Social History   Socioeconomic History  . Marital status: Married    Spouse name: Not on file  . Number of children: Not on file  . Years of education: Not on file  . Highest education level: Not on file  Occupational History  . Occupation: retired    Comment: [employer]  . Financial resource strain: Not on file  . Food insecurity:    Worry: Not on file    Inability: Not on file  . Transportation needs:    Medical: Not on file    Non-medical: Not on file  Tobacco Use  . Smoking status: Never Smoker  . Smokeless tobacco: Never Used  Substance and Sexual Activity  . Alcohol use: No  . Drug use: No  . Sexual activity: Not on file  Lifestyle  . Physical activity:    Days per week: Not on file    Minutes per session: Not on file  . Stress: Not on file  Relationships  . Social connections:    Talks on phone: Not on file    Gets together: Not on file    Attends religious service: Not on file    Active member of club or organization: Not on file    Attends meetings of clubs or organizations: Not on file    Relationship status: Not on file  Other Topics Concern  . Not on file  Social History Narrative  . Not on file   Outpatient Encounter Medications as of 10/02/2018  Medication Sig  . Cholecalciferol (VITAMIN D3) 5000 units CAPS Take 1 capsule (5,000 Units total) by mouth daily.  . clonazePAM (KLONOPIN) 0.5 MG tablet Take 1 tablet (0.5 mg total) by mouth daily.  . furosemide (LASIX) 20  MG tablet Take 20 mg by mouth daily as needed (for fluid retention).   . gabapentin (NEURONTIN) 300 MG capsule Take 1 capsule (300 mg total) by mouth 3 (three) times daily.  [name] glucose blood (ONETOUCH VERIO) test strip Use as instructed tid. E11.65  . Insulin Glargine (LANTUS SOLOSTAR) 100 UNIT/ML Solostar Pen Inject 40 Units into the skin at bedtime.  . Insulin Pen Needle (B-D ULTRAFINE III SHORT PEN) 31G X 8 MM MISC 1 each by Does not apply route as directed.  [name] JANUVIA 50 MG tablet TAKE 1 TABLET BY MOUTH  DAILY  . lisinopril (PRINIVIL,ZESTRIL) 20 MG tablet Take 20 mg by mouth 2 (two) times daily.  . metoprolol succinate (TOPROL-XL) 50 MG 24 hr tablet Take 50 mg by mouth daily. Take with or immediately following a meal.  . MYRBETRIQ 50 MG TB24 tablet Take 50 mg by mouth daily.  . simvastatin (ZOCOR) 20 MG tablet Take 20 mg by mouth every evening.  [name] tiZANidine (ZANAFLEX) 2 MG tablet Take by mouth every 6 (six) hours as needed for muscle spasms.  [name] zolpidem (AMBIEN) 10 MG tablet Take 10 mg by mouth at bedtime as needed for sleep.  . [  DISCONTINUED] glucose blood (ONETOUCH VERIO) test strip Use as instructed tid. E11.65  . [DISCONTINUED] Insulin Glargine (LANTUS SOLOSTAR) 100 UNIT/ML Solostar Pen Inject 40 Units into the skin at bedtime.   No facility-administered encounter medications on file as of 10/02/2018.    ALLERGIES: Allergies  Allergen Reactions  . Codeine Nausea And Vomiting   VACCINATION STATUS:  There is no immunization history on file for this patient.  Diabetes  She presents for her follow-up diabetic visit. She has type 2 diabetes mellitus. Onset time: She was diagnosed at approximate age of 58 years. Her disease course has been improving. There are no hypoglycemic associated symptoms. Pertinent negatives for hypoglycemia include no confusion, headaches, pallor or seizures. Pertinent negatives for diabetes include no chest pain, no fatigue, no polydipsia, no polyphagia and no  polyuria. There are no hypoglycemic complications. Symptoms are improving. There are no diabetic complications. Risk factors for coronary artery disease include dyslipidemia, diabetes mellitus and hypertension. Current diabetic treatment includes oral agent (dual therapy). Her weight is increasing steadily. She is following a generally unhealthy diet. She has had a previous visit with a dietitian. Her home blood glucose trend is increasing steadily. Her breakfast blood glucose range is generally 140-180 mg/dl. Her bedtime blood glucose range is generally 180-200 mg/dl. Her overall blood glucose range is 180-200 mg/dl. An ACE inhibitor/angiotensin II receptor blocker is being taken.  Hyperlipidemia  This is a chronic problem. The current episode started more than 1 year ago. Exacerbating diseases include diabetes and obesity. Pertinent negatives include no chest pain, myalgias or shortness of breath. Current antihyperlipidemic treatment includes statins. Risk factors for coronary artery disease include diabetes mellitus, dyslipidemia, hypertension, a sedentary lifestyle, post-menopausal and obesity.  Hypertension  This is a chronic problem. The current episode started more than 1 year ago. The problem is controlled. Pertinent negatives include no chest pain, headaches, palpitations or shortness of breath. Risk factors for coronary artery disease include dyslipidemia, diabetes mellitus, family history, obesity, sedentary lifestyle and post-menopausal state. Past treatments include ACE inhibitors. The current treatment provides moderate improvement.    Review of Systems  Constitutional: Negative for fatigue and unexpected weight change.  HENT: Negative for trouble swallowing and voice change.   Eyes: Negative for visual disturbance.  Respiratory: Negative for cough, shortness of breath and wheezing.   Cardiovascular: Negative for chest pain, palpitations and leg swelling.  Gastrointestinal: Negative for  diarrhea, nausea and vomiting.  Endocrine: Negative for cold intolerance, heat intolerance, polydipsia, polyphagia and polyuria.  Musculoskeletal: Positive for gait problem. Negative for arthralgias and myalgias.  Skin: Negative for color change, pallor, rash and wound.  Neurological: Negative for seizures and headaches.  Psychiatric/Behavioral: Negative for confusion and suicidal ideas.    Objective:    BP 136/84   Pulse (!) 53   Ht 5\' 5"  (1.651 m)   Wt 225 lb (102.1 kg)   BMI 37.44 kg/m   Wt Readings from Last 3 Encounters:  10/02/18 225 lb (102.1 kg)  06/11/18 222 lb (100.7 kg)  04/29/18 206 lb (93.4 kg)    Physical Exam  Constitutional: She is oriented to person, place, and time. She appears well-developed.  Walks with a cane.  HENT:  Head: Normocephalic and atraumatic.  Eyes: EOM are normal.  Neck: Normal range of motion. Neck supple. No tracheal deviation present. No thyromegaly present.  Cardiovascular: Normal rate.  Pulmonary/Chest: Effort normal.  Abdominal: Bowel sounds are normal. There is no tenderness. There is no guarding.  Musculoskeletal: She exhibits no edema.  Walks with a cane.  Neurological: She is alert and oriented to person, place, and time. She has normal reflexes. No cranial nerve deficit. Coordination normal.  Skin: Skin is warm and dry. No rash noted. No erythema. No pallor.  Psychiatric: She has a normal mood and affect. Judgment normal.    Results for orders placed or performed in visit on 09/25/18  Comprehensive metabolic panel  Result Value Ref Range   Glucose 90 65 - 99 mg/dL   BUN 24 8 - 27 mg/dL   Creatinine, Ser 1.20 (H) 0.57 - 1.00 mg/dL   GFR calc non Af Amer 41 (L) >59 mL/min/1.73   GFR calc Af Amer 47 (L) >59 mL/min/1.73   BUN/Creatinine Ratio 20 12 - 28   Sodium 142 134 - 144 mmol/L   Potassium 4.4 3.5 - 5.2 mmol/L   Chloride 98 96 - 106 mmol/L   CO2 27 20 - 29 mmol/L   Calcium 9.7 8.7 - 10.3 mg/dL   Total Protein 6.2 6.0 -  8.5 g/dL   Albumin 3.7 3.5 - 4.7 g/dL   Globulin, Total 2.5 1.5 - 4.5 g/dL   Albumin/Globulin Ratio 1.5 1.2 - 2.2   Bilirubin Total 0.6 0.0 - 1.2 mg/dL   Alkaline Phosphatase 89 39 - 117 IU/L   AST 15 0 - 40 IU/L   ALT 15 0 - 32 IU/L  Hgb A1c w/o eAG  Result Value Ref Range   Hgb A1c MFr Bld 8.8 (H) 4.8 - 5.6 %  Specimen status report  Result Value Ref Range   specimen status report Comment    Diabetic Labs (most recent): Lab Results  Component Value Date   HGBA1C 8.8 (H) 09/25/2018   HGBA1C 10.2 (H) 06/04/2018   HGBA1C 10.2 (H) 02/20/2018     Assessment & Plan:   1. Uncontrolled type 2 diabetes mellitus with CKD as a complication, without long-term current use of insulin ([place])  -She returns with significantly improved glycemic profile, A1c of 8.8% improving from 10.2%.   -She has no reported nor documented hypoglycemia.   -She is making some changes in her diet. - Patient remains at a high risk for more acute and chronic complications of diabetes which include CAD, CVA, CKD, retinopathy, and neuropathy. These are all discussed in detail with the patient.  - I have re-counseled the patient on diet management and weight loss  by adopting a carbohydrate restricted / protein rich  Diet. - Patient is advised to stick to a routine mealtimes to eat 3 meals  a day and avoid unnecessary snacks ( to snack only to correct hypoglycemia).  -  Suggestion is made for her to avoid simple carbohydrates  from her diet including Cakes, Sweet Desserts / Pastries, Ice Cream, Soda (diet and regular), Sweet Tea, Candies, Chips, Cookies, Store Bought Juices, Alcohol in Excess of  1-2 drinks a day, Artificial Sweeteners, and "Sugar-free" Products. This will help patient to have stable blood glucose profile and potentially avoid unintended weight gain.  - I have approached patient with the following individualized plan to manage diabetes and patient agrees.   -Due to her presentation with  significantly better glycemic profile and due to the desire to avoid hypoglycemia in this patient, she would not be given prandial insulin for now.    -She would benefit from continued simplified treatment regimen.   -She is advised to continue Lantus 40 units nightly,  associated with strict monitoring of blood glucose 2 times a day-before breakfast and  at bedtime.    -I advised her to continue Januvia 50 mg p.o. daily with breakfast. -Patient is encouraged to call clinic for blood glucose levels less than 70 or above 300 mg /dlx3.  - Patient specific target  for A1c; LDL, HDL, Triglycerides, and  Waist Circumference were discussed in detail.  2) BP/HTN: Her blood pressure is controlled to target.  She is advised to continue her current blood pressure medications including lisinopril 20 mg p.o. twice daily.  3) Lipids/HPL: Her LDL is controlled at 48, has been taking simvastatin 20 mg p.o. daily for several years.  This medication will be stopped for during her next visit.   4)  Weight/Diet: CDE consult in progress, exercise, and carbohydrates information provided.  5)  Vitamin D deficiency - She is  s/p 12 weeks of  vitamin D therapy, 50,000 units weekly x 12 weeks.  5) Chronic Care/Health Maintenance:  -Patien is on ACEI and Statin medications and encouraged to continue to follow up with Ophthalmology, Podiatrist at least yearly or according to recommendations, and advised to stay away from smoking. I have recommended yearly flu vaccine and pneumonia vaccination at least every 5 years; and  sleep for at least 7 hours a day.  I advised patient to maintain close follow up with her PCP for primary care needs.   - Time spent with the patient: 25 min, of which >50% was spent in reviewing her blood glucose logs , discussing her hypo- and hyper-glycemic episodes, reviewing her current and  previous labs and insulin doses and developing a plan to avoid hypo- and hyper-glycemia. Please refer to  Patient Instructions for Blood Glucose Monitoring and Insulin/Medications Dosing Guide"  in media tab for additional information. [name] participated in the discussions, expressed understanding, and voiced agreement with the above plans.  All questions were answered to her satisfaction. she is encouraged to contact clinic should she have any questions or concerns prior to her return visit.  Follow up plan: Return in about 3 months (around 01/02/2019) for Follow up with Pre-visit Labs, Meter, and Logs.  Glade Lloyd, MD Phone: [phone]  Fax: [phone]  -  This note was partially dictated with voice recognition software. Similar sounding words can be transcribed inadequately or may not  be corrected upon review.  10/02/2018, 1:39 PM

## 2018-10-06 ENCOUNTER — Encounter: Payer: Self-pay | Admitting: Nutrition

## 2018-10-06 ENCOUNTER — Other Ambulatory Visit: Payer: Self-pay | Admitting: Neurology

## 2018-10-06 NOTE — Patient Instructions (Signed)
Goals 1. Get BS before bed less than 150 mg/dl. Don't eat past 7 pm Use exercise bike 15 minutes  A day Cut out snacks Drink only water. Get A1C down to 7%  Lose 5-10 lbs in the next 3-5 months.

## 2018-10-07 NOTE — Telephone Encounter (Signed)
Please send

## 2018-11-17 ENCOUNTER — Other Ambulatory Visit: Payer: Self-pay

## 2018-11-17 ENCOUNTER — Observation Stay (HOSPITAL_BASED_OUTPATIENT_CLINIC_OR_DEPARTMENT_OTHER): Payer: Medicare Other

## 2018-11-17 ENCOUNTER — Emergency Department (HOSPITAL_COMMUNITY): Payer: Medicare Other

## 2018-11-17 ENCOUNTER — Observation Stay (HOSPITAL_COMMUNITY)
Admission: EM | Admit: 2018-11-17 | Discharge: 2018-11-18 | Disposition: A | Discharge status: Home / Self Care | Payer: Medicare Other | Attending: Internal Medicine | Admitting: Internal Medicine

## 2018-11-17 ENCOUNTER — Encounter (HOSPITAL_COMMUNITY): Payer: Self-pay | Admitting: Emergency Medicine

## 2018-11-17 ENCOUNTER — Observation Stay (HOSPITAL_COMMUNITY): Payer: Medicare Other

## 2018-11-17 DIAGNOSIS — I370 Nonrheumatic pulmonary valve stenosis: Secondary | ICD-10-CM | POA: Diagnosis not present

## 2018-11-17 DIAGNOSIS — I361 Nonrheumatic tricuspid (valve) insufficiency: Secondary | ICD-10-CM

## 2018-11-17 DIAGNOSIS — I10 Essential (primary) hypertension: Secondary | ICD-10-CM | POA: Diagnosis present

## 2018-11-17 DIAGNOSIS — R471 Dysarthria and anarthria: Secondary | ICD-10-CM | POA: Diagnosis not present

## 2018-11-17 DIAGNOSIS — R4781 Slurred speech: Secondary | ICD-10-CM | POA: Diagnosis present

## 2018-11-17 DIAGNOSIS — Z794 Long term (current) use of insulin: Secondary | ICD-10-CM | POA: Diagnosis not present

## 2018-11-17 DIAGNOSIS — E1140 Type 2 diabetes mellitus with diabetic neuropathy, unspecified: Secondary | ICD-10-CM | POA: Diagnosis present

## 2018-11-17 DIAGNOSIS — R0902 Hypoxemia: Secondary | ICD-10-CM | POA: Diagnosis not present

## 2018-11-17 DIAGNOSIS — E1165 Type 2 diabetes mellitus with hyperglycemia: Secondary | ICD-10-CM | POA: Diagnosis not present

## 2018-11-17 DIAGNOSIS — Z79899 Other long term (current) drug therapy: Secondary | ICD-10-CM | POA: Insufficient documentation

## 2018-11-17 DIAGNOSIS — I6523 Occlusion and stenosis of bilateral carotid arteries: Secondary | ICD-10-CM | POA: Diagnosis not present

## 2018-11-17 DIAGNOSIS — E785 Hyperlipidemia, unspecified: Secondary | ICD-10-CM | POA: Diagnosis present

## 2018-11-17 DIAGNOSIS — I639 Cerebral infarction, unspecified: Secondary | ICD-10-CM

## 2018-11-17 DIAGNOSIS — E782 Mixed hyperlipidemia: Secondary | ICD-10-CM | POA: Insufficient documentation

## 2018-11-17 DIAGNOSIS — R531 Weakness: Secondary | ICD-10-CM | POA: Diagnosis not present

## 2018-11-17 DIAGNOSIS — I959 Hypotension, unspecified: Secondary | ICD-10-CM | POA: Diagnosis not present

## 2018-11-17 DIAGNOSIS — N179 Acute kidney failure, unspecified: Secondary | ICD-10-CM | POA: Diagnosis present

## 2018-11-17 LAB — HEMOGLOBIN A1C
Hgb A1c MFr Bld: 9 % — ABNORMAL HIGH (ref 4.8–5.6)
Mean Plasma Glucose: 211.6 mg/dL

## 2018-11-17 LAB — DIFFERENTIAL
Abs Immature Granulocytes: 0.05 10*3/uL (ref 0.00–0.07)
Basophils Absolute: 0.1 10*3/uL (ref 0.0–0.1)
Basophils Relative: 1 %
Eosinophils Absolute: 0.3 10*3/uL (ref 0.0–0.5)
Eosinophils Relative: 3 %
Immature Granulocytes: 1 %
Lymphocytes Relative: 18 %
Lymphs Abs: 1.7 10*3/uL (ref 0.7–4.0)
Monocytes Absolute: 1 10*3/uL (ref 0.1–1.0)
Monocytes Relative: 10 %
Neutro Abs: 6.3 10*3/uL (ref 1.7–7.7)
Neutrophils Relative %: 67 %

## 2018-11-17 LAB — GLUCOSE, CAPILLARY
Glucose-Capillary: 433 mg/dL — ABNORMAL HIGH (ref 70–99)
Glucose-Capillary: 395 mg/dL — ABNORMAL HIGH (ref 70–99)
Glucose-Capillary: 276 mg/dL — ABNORMAL HIGH (ref 70–99)
Glucose-Capillary: 162 mg/dL — ABNORMAL HIGH (ref 70–99)
Glucose-Capillary: 264 mg/dL — ABNORMAL HIGH (ref 70–99)
Glucose-Capillary: 257 mg/dL — ABNORMAL HIGH (ref 70–99)

## 2018-11-17 LAB — BASIC METABOLIC PANEL
Anion gap: 10 (ref 5–15)
BUN: 25 mg/dL — ABNORMAL HIGH (ref 8–23)
CO2: 27 mmol/L (ref 22–32)
Calcium: 9.3 mg/dL (ref 8.9–10.3)
Chloride: 95 mmol/L — ABNORMAL LOW (ref 98–111)
Creatinine, Ser: 2.17 mg/dL — ABNORMAL HIGH (ref 0.44–1.00)
GFR calc Af Amer: 22 mL/min — ABNORMAL LOW (ref >60)
GFR calc non Af Amer: 19 mL/min — ABNORMAL LOW (ref >60)
Glucose, Bld: 568 mg/dL (ref 70–99)
Potassium: 4.2 mmol/L (ref 3.5–5.1)
Sodium: 132 mmol/L — ABNORMAL LOW (ref 135–145)

## 2018-11-17 LAB — ECHOCARDIOGRAM COMPLETE
Height: 68 in
Weight: 3679.04 oz

## 2018-11-17 LAB — APTT: aPTT: 26 seconds (ref 24–36)

## 2018-11-17 LAB — URINALYSIS, ROUTINE W REFLEX MICROSCOPIC
Bacteria, UA: NONE SEEN
Bilirubin Urine: NEGATIVE
Glucose, UA: >=500 mg/dL — AB
Hgb urine dipstick: NEGATIVE
Ketones, ur: NEGATIVE mg/dL
Leukocytes, UA: NEGATIVE
Nitrite: NEGATIVE
Protein, ur: NEGATIVE mg/dL
Specific Gravity, Urine: 1.013 (ref 1.005–1.030)
pH: 5 (ref 5.0–8.0)

## 2018-11-17 LAB — LIPID PANEL
Cholesterol: 143 mg/dL (ref 0–200)
HDL: 39 mg/dL — ABNORMAL LOW (ref >40)
LDL Cholesterol: 69 mg/dL (ref 0–99)
Total CHOL/HDL Ratio: 3.7 RATIO
Triglycerides: 174 mg/dL — ABNORMAL HIGH (ref <150)
VLDL: 35 mg/dL (ref 0–40)

## 2018-11-17 LAB — PHOSPHORUS: Phosphorus: 3.9 mg/dL (ref 2.5–4.6)

## 2018-11-17 LAB — HEPATIC FUNCTION PANEL
ALT: 19 U/L (ref 0–44)
AST: 19 U/L (ref 15–41)
Albumin: 3.7 g/dL (ref 3.5–5.0)
Alkaline Phosphatase: 77 U/L (ref 38–126)
Bilirubin, Direct: 0.2 mg/dL (ref 0.0–0.2)
Indirect Bilirubin: 0.8 mg/dL (ref 0.3–0.9)
Total Bilirubin: 1 mg/dL (ref 0.3–1.2)
Total Protein: 6.9 g/dL (ref 6.5–8.1)

## 2018-11-17 LAB — CBC
HCT: 40.7 % (ref 36.0–46.0)
Hemoglobin: 13.3 g/dL (ref 12.0–15.0)
MCH: 29.3 pg (ref 26.0–34.0)
MCHC: 32.7 g/dL (ref 30.0–36.0)
MCV: 89.6 fL (ref 80.0–100.0)
Platelets: 205 10*3/uL (ref 150–400)
RBC: 4.54 MIL/uL (ref 3.87–5.11)
RDW: 13.3 % (ref 11.5–15.5)
WBC: 9.3 10*3/uL (ref 4.0–10.5)
nRBC: 0 % (ref 0.0–0.2)

## 2018-11-17 LAB — CBG MONITORING, ED
Glucose-Capillary: 503 mg/dL (ref 70–99)
Glucose-Capillary: 515 mg/dL (ref 70–99)

## 2018-11-17 LAB — PROTIME-INR
INR: 0.98
Prothrombin Time: 12.9 seconds (ref 11.4–15.2)

## 2018-11-17 LAB — MAGNESIUM: Magnesium: 1.7 mg/dL (ref 1.7–2.4)

## 2018-11-17 LAB — I-STAT TROPONIN, ED: Troponin i, poc: 0.01 ng/mL (ref 0.00–0.08)

## 2018-11-17 MED ORDER — SODIUM CHLORIDE 0.9 % IV SOLN
INTRAVENOUS | Status: DC
  Administered 2018-11-17 – 2018-11-18 (×2): via INTRAVENOUS

## 2018-11-17 MED ORDER — HEPARIN SODIUM (PORCINE) 5000 UNIT/ML IJ SOLN: 5000.0000 [IU] | Freq: Three times a day (TID) | INTRAMUSCULAR | Status: DC

## 2018-11-17 MED ORDER — STROKE: EARLY STAGES OF RECOVERY BOOK
Freq: Once | Status: AC
  Administered 2018-11-17: 07:00:00
  Filled 2018-11-17 (×2): qty 1

## 2018-11-17 MED ORDER — ACETAMINOPHEN 325 MG PO TABS
650.0000 mg | ORAL_TABLET | Freq: Four times a day (QID) | ORAL | Status: DC | PRN
  Administered 2018-11-18: 650 mg via ORAL
  Filled 2018-11-17: qty 2

## 2018-11-17 MED ORDER — ACETAMINOPHEN 650 MG RE SUPP: 650.0000 mg | RECTAL | Status: DC | PRN

## 2018-11-17 MED ORDER — INSULIN GLARGINE 100 UNIT/ML ~~LOC~~ SOLN
30.0000 [IU] | Freq: Every day | SUBCUTANEOUS | Status: DC
  Administered 2018-11-17: 30 [IU] via SUBCUTANEOUS
  Filled 2018-11-17 (×2): qty 0.3

## 2018-11-17 MED ORDER — ONDANSETRON HCL 4 MG/2ML IJ SOLN: 4.0000 mg | Freq: Four times a day (QID) | INTRAMUSCULAR | Status: DC | PRN

## 2018-11-17 MED ORDER — ASPIRIN 325 MG PO TABS: 325.0000 mg | ORAL_TABLET | Freq: Once | ORAL | Status: AC

## 2018-11-17 MED ORDER — ACETAMINOPHEN 160 MG/5ML PO SOLN: 650.0000 mg | ORAL | Status: DC | PRN

## 2018-11-17 MED ORDER — SODIUM CHLORIDE 0.9 % IV BOLUS
1000.0000 mL | Freq: Once | INTRAVENOUS | Status: AC
  Administered 2018-11-17: 1000 mL via INTRAVENOUS

## 2018-11-17 MED ORDER — ASPIRIN 81 MG PO CHEW
81.0000 mg | CHEWABLE_TABLET | Freq: Every day | ORAL | Status: DC
  Administered 2018-11-18: 81 mg via ORAL
  Filled 2018-11-17: qty 1

## 2018-11-17 MED ORDER — INSULIN ASPART 100 UNIT/ML ~~LOC~~ SOLN: 0.0000 [IU] | Freq: Three times a day (TID) | SUBCUTANEOUS | Status: DC

## 2018-11-17 MED ORDER — ACETAMINOPHEN 325 MG PO TABS: 650.0000 mg | ORAL_TABLET | ORAL | Status: DC | PRN

## 2018-11-17 MED ORDER — INFLUENZA VAC SPLIT HIGH-DOSE 0.5 ML IM SUSY: 0.5000 mL | PREFILLED_SYRINGE | INTRAMUSCULAR | Status: DC

## 2018-11-17 MED ORDER — HEPARIN SODIUM (PORCINE) 5000 UNIT/ML IJ SOLN
5000.0000 [IU] | Freq: Three times a day (TID) | INTRAMUSCULAR | Status: DC
  Administered 2018-11-17 – 2018-11-18 (×4): 5000 [IU] via SUBCUTANEOUS
  Filled 2018-11-17 (×4): qty 1

## 2018-11-17 MED ORDER — ASPIRIN 300 MG RE SUPP
300.0000 mg | Freq: Once | RECTAL | Status: AC
  Administered 2018-11-17: 300 mg via RECTAL
  Filled 2018-11-17: qty 1

## 2018-11-17 MED ORDER — INSULIN ASPART 100 UNIT/ML ~~LOC~~ SOLN
0.0000 [IU] | SUBCUTANEOUS | Status: DC
  Administered 2018-11-17: 11 [IU] via SUBCUTANEOUS
  Administered 2018-11-17: 20 [IU] via SUBCUTANEOUS
  Administered 2018-11-17: 4 [IU] via SUBCUTANEOUS
  Administered 2018-11-17 – 2018-11-18 (×2): 11 [IU] via SUBCUTANEOUS
  Administered 2018-11-18: 4 [IU] via SUBCUTANEOUS
  Administered 2018-11-18: 11 [IU] via SUBCUTANEOUS
  Administered 2018-11-18: 4 [IU] via SUBCUTANEOUS

## 2018-11-17 MED ORDER — ACETAMINOPHEN 650 MG RE SUPP: 650.0000 mg | Freq: Four times a day (QID) | RECTAL | Status: DC | PRN

## 2018-11-17 MED ORDER — ONDANSETRON HCL 4 MG PO TABS: 4.0000 mg | ORAL_TABLET | Freq: Four times a day (QID) | ORAL | Status: DC | PRN

## 2018-11-17 MED ORDER — INSULIN ASPART 100 UNIT/ML ~~LOC~~ SOLN
8.0000 [IU] | Freq: Once | SUBCUTANEOUS | Status: AC
  Administered 2018-11-17: 8 [IU] via SUBCUTANEOUS

## 2018-11-17 NOTE — Evaluation (Signed)
Occupational Therapy Evaluation Patient Details Name: [name] MRN: [id number] DOB: July 13, 1929 Today's Date: 11/17/2018    History of Present Illness [name] is a 84 y.o. female with medical history significant of diabetes mellitus without complication, diabetic neuropathy, hypertension, urinary incontinence who is coming with complaints of slurred speech since about 1300 on 11/15/2018.  The patient's daughter states that around this time, although she did not notice much slurred speech, she noticed that the patient was very quiet, which is not herself.  The patient's daughter went home and at around midnight, she received a phone call from the patient's husband stating that they were going to the hospital due to recurrence/worsening of slurred speech.   Clinical Impression   Pt received supine in bed, agreeable to OT/PT co-evaluation, daughter and son-in-law present for initial part of evaluation and able to assist with history. Pt reports independence with ADLs at baseline, reports use of wheelchair and RW, family reports pt primarily uses wheelchair for mobility purposes. Pt with generalized weakness and decreased activity tolerance limiting independence in ADL completion and mobility tasks. Family reports several falls recently. Pt reports husband assists at times however he has the flu currently and ability to physically assist is limited.  Recommend SNF on discharge to improve safety and independence in ADLs and functional mobility tasks.     Follow Up Recommendations  SNF    Equipment Recommendations  None recommended by OT       Precautions / Restrictions Precautions Precautions: Fall Restrictions Weight Bearing Restrictions: No      Mobility Bed Mobility Overal bed mobility: Needs Assistance Bed Mobility: Supine to Sit;Sit to Supine     Supine to sit: Mod assist Sit to supine: Min assist   General bed mobility comments: Defer to PT  note  Transfers Overall transfer level: Needs assistance Equipment used: Rolling walker (2 wheeled);None;1 person hand held assist Transfers: Sit to/from [facility] Sit to Stand: Min assist Stand pivot transfers: Min assist       General transfer comment: Defer to PT note        ADL either performed or assessed with clinical judgement   ADL Overall ADL's : Needs assistance/impaired     Grooming: Set up;Sitting Grooming Details (indicate cue type and reason): Pt unable to ambulate to or stand at sink for ADL completion due to weakness                 Toilet Transfer: Moderate assistance;BSC             General ADL Comments: Pt requiring increased assistance for ADL completion due to pain from fall and generalized weakness/poor activity tolerance     Vision Baseline Vision/History: No visual deficits Patient Visual Report: No change from baseline Vision Assessment?: No apparent visual deficits            Pertinent Vitals/Pain Pain Assessment: Faces Pain Score: 2  Faces Pain Scale: Hurts a little bit Pain Location: generalized pain all over, mostly in right anterior lower leg due to bumping into bed during fall at home Pain Descriptors / Indicators: Sore Pain Intervention(s): Limited activity within patient's tolerance;Monitored during session;Repositioned     Hand Dominance Right   Extremity/Trunk Assessment Upper Extremity Assessment Upper Extremity Assessment: Generalized weakness   Lower Extremity Assessment Lower Extremity Assessment: Defer to PT evaluation   Cervical / Trunk Assessment Cervical / Trunk Assessment: Normal   Communication Communication Communication: No difficulties   Cognition Arousal/Alertness: Awake/alert Behavior During  Therapy: WFL for tasks assessed/performed Overall Cognitive Status: Within Functional Limits for tasks assessed                                                Home  Living Family/patient expects to be discharged to:: Private residence Living Arrangements: Spouse/significant other Available Help at Discharge: Family;Available PRN/intermittently Type of Home: House                       Home Equipment: Walker - 2 wheels;Cane - single point;Wheelchair - manual          Prior Functioning/Environment Level of Independence: Independent with assistive device(s)        Comments: Pt reports independence in ADL completion, reports uses a wheelchair mostly and uses RW sometimes. Daughter and son-in-law report pt mainly uses wheelchair but is able to use RW. Pt reports husband also assists with mobility when needed.         OT Problem List: Decreased strength;Decreased activity tolerance;Impaired balance (sitting and/or standing);Decreased safety awareness;Decreased knowledge of use of DME or AE;Impaired UE functional use      OT Treatment/Interventions: Self-care/ADL training;Therapeutic exercise;DME and/or AE instruction;Therapeutic activities;Patient/family education    OT Goals(Current goals can be found in the care plan section) Acute Rehab OT Goals Patient Stated Goal: to return home OT Goal Formulation: With patient Time For Goal Achievement: 12/01/18 Potential to Achieve Goals: Good  OT Frequency: Min 2X/week           Co-evaluation PT/OT/SLP Co-Evaluation/Treatment: Yes Reason for Co-Treatment: Complexity of the patient's impairments (multi-system involvement)   OT goals addressed during session: ADL's and self-care      AM-PAC OT "6 Clicks" Daily Activity     Outcome Measure Help from another person eating meals?: None Help from another person taking care of personal grooming?: A Little Help from another person toileting, which includes using toliet, bedpan, or urinal?: A Little Help from another person bathing (including washing, rinsing, drying)?: A Lot Help from another person to put on and taking off regular upper body  clothing?: A Little Help from another person to put on and taking off regular lower body clothing?: A Lot 6 Click Score: 17   End of Session    Activity Tolerance: Patient tolerated treatment well Patient left: Other (comment)(with MRI transport on gurney )  OT Visit Diagnosis: Repeated falls (R29.6);Muscle weakness (generalized) (M62.81)                Time: 4734-0370 OT Time Calculation (min): 28 min Charges:  OT General Charges $OT Visit: 1 Visit OT Evaluation $OT Eval Low Complexity: [place], OTR/L  [phone] 11/17/2018, 12:36 PM

## 2018-11-17 NOTE — Progress Notes (Signed)
*  PRELIMINARY RESULTS* Echocardiogram 2D Echocardiogram has been performed.  Leavy Cella 11/17/2018, 11:10 AM

## 2018-11-17 NOTE — H&P (Signed)
History and Physical    BATHSHEBA DURRETT LNL:[id number] DOB: [birth year] DOA: 11/17/2018  PCP: Sharilyn Sites, MD   Patient coming from: Home  Chief Complaint: Slurred speech  HPI: [name] is a 84 y.o. female with medical history significant of diabetes mellitus without complication, diabetic neuropathy, hypertension, urinary incontinence who is coming with complaints of slurred speech since about 1300 on 11/15/2018.  The patient's daughter states that around this time, although she did not notice much slurred speech, she noticed that the patient was very quiet, which is not herself.  The patient's daughter went home and at around midnight, she received a phone call from the patient's husband stating that they were going to the hospital due to recurrence/worsening of slurred speech.  She denies headache, blurred vision, but feels mild lightheadedness.  Denies nausea, emesis, focal weakness or paresthesias on her extremities.  No fever, chills, sore throat, cough, hemoptysis, wheezing, chest pain, palpitations, diaphoresis, PND, orthopnea or recent pitting edema of the lower extremities.  No abdominal pain, diarrhea, constipation, melena or hematochezia.  Denies dysuria, frequency or hematuria.  Denies polyuria, polydipsia, polyphagia or blurred vision.  However, her symptoms may be due to hyperosmolar state.   ED Course: BP (!) 133/55   Pulse (!) 58   Temp (!) 97.5 F (36.4 C) (Oral)   Resp 16   SpO2 96%.  She received IV fluids in the emergency department.  She was seen by tele-neurology who recommended TIA/CVA work-up.  Her CBC was normal.  PT/INR/PTT within normal limits.  LFTs were normal.  Her sodium was 132 (139 when corrected to glucose of 568), chloride 95 mmol/L.  All other electrolytes are within normal limits.  BUN was 25, creatinine 2.17 and glucose 568 mg/dL.  Her her CT head did not show any acute intracranial pathology.  Review of Systems: As per HPI otherwise 10  point review of systems negative.    Past Medical History:  Diagnosis Date  . Diabetes mellitus without complication ([place])   . Diabetic neuropathy ([place])   . Hypertension   . Urinary incontinence     Past Surgical History:  Procedure Laterality Date  . ABDOMINAL HYSTERECTOMY    . APPENDECTOMY    . BREAST SURGERY     begnin tumor removed  . CATARACT EXTRACTION, BILATERAL       reports that she has never smoked. She has never used smokeless tobacco. She reports that she does not drink alcohol or use drugs.  Allergies  Allergen Reactions  . Codeine Nausea And Vomiting    Family History  Problem Relation Age of Onset  . CAD Mother   . Hypertension Mother   . Stroke Father   . Heart attack Brother   . Cancer Brother      Prior to Admission medications   Medication Sig Start Date End Date Taking? Authorizing Provider  Cholecalciferol (VITAMIN D3) 5000 units CAPS Take 1 capsule (5,000 Units total) by mouth daily. 04/15/17   Cassandria Anger, MD  clonazePAM (KLONOPIN) 0.5 MG tablet Take 0.5 tablets (0.25 mg total) by mouth daily. 10/07/18   Tat, Eustace Quail, DO  furosemide (LASIX) 20 MG tablet Take 20 mg by mouth daily as needed (for fluid retention).     [provider]  gabapentin (NEURONTIN) 300 MG capsule Take 1 capsule (300 mg total) by mouth 3 (three) times daily. 07/11/18   Cassandria Anger, MD  glucose blood (ONETOUCH VERIO) test strip Use as instructed tid. E11.65 10/02/18  Cassandria Anger, MD  Insulin Glargine (LANTUS SOLOSTAR) 100 UNIT/ML Solostar Pen Inject 40 Units into the skin at bedtime. 10/02/18   Cassandria Anger, MD  Insulin Pen Needle (B-D ULTRAFINE III SHORT PEN) 31G X 8 MM MISC 1 each by Does not apply route as directed. 02/26/18   Cassandria Anger, MD  JANUVIA 50 MG tablet TAKE 1 TABLET BY MOUTH  DAILY 05/01/18   Cassandria Anger, MD  lisinopril (PRINIVIL,ZESTRIL) 20 MG tablet Take 20 mg by mouth 2 (two) times daily.     [provider]  metoprolol succinate (TOPROL-XL) 50 MG 24 hr tablet Take 50 mg by mouth daily. Take with or immediately following a meal.    [provider]  MYRBETRIQ 50 MG TB24 tablet Take 50 mg by mouth daily. 09/02/14   [provider]  simvastatin (ZOCOR) 20 MG tablet Take 20 mg by mouth every evening.    [provider]  tiZANidine (ZANAFLEX) 2 MG tablet Take by mouth every 6 (six) hours as needed for muscle spasms.    [provider]  zolpidem (AMBIEN) 10 MG tablet Take 10 mg by mouth at bedtime as needed for sleep.    [provider]    Physical Exam: Vitals:   11/17/18 0200 11/17/18 0215 11/17/18 0230 11/17/18 0300  BP: (!) 118/42  (!) 117/54 (!) 133/55  Pulse: (!) 56 (!) 58 (!) 57 (!) 58  Resp:  17 13 16   Temp:      TempSrc:      SpO2: 98% 96% 97% 96%    Constitutional: NAD, calm, comfortable Eyes: PERRL, lids and conjunctivae normal ENMT: Mucous membranes are moist. Posterior pharynx clear of any exudate or lesions. Neck: normal, supple, no masses, no thyromegaly Respiratory: Decreased breath sounds on bases, otherwise  clear to auscultation bilaterally, no wheezing, no crackles. Normal respiratory effort. No accessory muscle use.  Cardiovascular: Bradycardic at 56 bpm, no murmurs / rubs / gallops. No extremity edema. 2+ pedal pulses. No carotid bruits.  Abdomen: Obese, soft, no tenderness, no masses palpated. No hepatosplenomegaly. Bowel sounds positive.  Musculoskeletal: no clubbing / cyanosis. Good ROM, no contractures. Normal muscle tone.  Skin: no rashes, lesions, ulcers on limited dermatological examination. Neurologic: CN 2-12 grossly intact. Sensation intact, DTR normal.  There is some pronator drift on her LUE.  Handgrip seems to be stronger on the left side. Psychiatric: Normal judgment and insight. Alert and oriented x 3. Normal mood.    Labs on Admission: I have personally reviewed following labs and imaging  studies  CBC: Recent Labs  Lab 11/17/18 0141  WBC 9.3  NEUTROABS 6.3  HGB 13.3  HCT 40.7  MCV 89.6  PLT 932   Basic Metabolic Panel: Recent Labs  Lab 11/17/18 0141  NA 132*  K 4.2  CL 95*  CO2 27  GLUCOSE 568*  BUN 25*  CREATININE 2.17*  CALCIUM 9.3   GFR: CrCl cannot be calculated (Unknown ideal weight.). Liver Function Tests: No results for input(s): AST, ALT, ALKPHOS, BILITOT, PROT, ALBUMIN in the last 168 hours. No results for input(s): LIPASE, AMYLASE in the last 168 hours. No results for input(s): AMMONIA in the last 168 hours. Coagulation Profile: Recent Labs  Lab 11/17/18 0141  INR 0.98   Cardiac Enzymes: No results for input(s): CKTOTAL, CKMB, CKMBINDEX, TROPONINI in the last 168 hours. BNP (last 3 results) No results for input(s): PROBNP in the last 8760 hours. HbA1C: No results for input(s): HGBA1C in the  last 72 hours. CBG: Recent Labs  Lab 11/17/18 0040  GLUCAP 515*   Lipid Profile: No results for input(s): CHOL, HDL, LDLCALC, TRIG, CHOLHDL, LDLDIRECT in the last 72 hours. Thyroid Function Tests: No results for input(s): TSH, T4TOTAL, FREET4, T3FREE, THYROIDAB in the last 72 hours. Anemia Panel: No results for input(s): VITAMINB12, FOLATE, FERRITIN, TIBC, IRON, RETICCTPCT in the last 72 hours. Urine analysis:    Component Value Date/Time   COLORURINE YELLOW 09/06/2009 0135   APPEARANCEUR CLEAR 09/06/2009 0135   LABSPEC >1.030 (H) 09/06/2009 0135   PHURINE 5.0 09/06/2009 0135   GLUCOSEU NEGATIVE 09/06/2009 0135   HGBUR NEGATIVE 09/06/2009 0135   BILIRUBINUR SMALL (A) 09/06/2009 0135   KETONESUR NEGATIVE 09/06/2009 0135   PROTEINUR NEGATIVE 09/06/2009 0135   UROBILINOGEN 0.2 09/06/2009 0135   NITRITE NEGATIVE 09/06/2009 0135   LEUKOCYTESUR  09/06/2009 0135    NEGATIVE MICROSCOPIC NOT DONE ON URINES WITH NEGATIVE PROTEIN, BLOOD, LEUKOCYTES, NITRITE, OR GLUCOSE <1000 mg/dL.    Radiological Exams on Admission: Ct Head Wo  Contrast  Result Date: 11/17/2018 CLINICAL DATA:  Slurred speech and left facial droop. EXAM: CT HEAD WITHOUT CONTRAST TECHNIQUE: Contiguous axial images were obtained from the base of the skull through the vertex without intravenous contrast. COMPARISON:  None. FINDINGS: Brain: There is no mass, hemorrhage or extra-axial collection. The size and configuration of the ventricles and extra-axial CSF spaces are normal for age. The brain parenchyma is normal, without evidence of acute or chronic infarction. Vascular: Atherosclerotic calcification of the internal carotid arteries at the skull base. No abnormal hyperdensity of the major intracranial arteries or dural venous sinuses. Skull: The visualized skull base, calvarium and extracranial soft tissues are normal. Sinuses/Orbits: No fluid levels or advanced mucosal thickening of the visualized paranasal sinuses. No mastoid or middle ear effusion. The orbits are normal. IMPRESSION: Normal aging brain. Electronically Signed   By: Ulyses Jarred M.D.   On: 11/17/2018 02:17    EKG: Independently reviewed.  Vent. rate 59 BPM PR interval * ms QRS duration 130 ms QT/QTc 468/464 ms P-R-T axes 61 80 1 Sinus rhythm Right bundle branch block No acute changes No significant change since last tracing  Assessment/Plan Principal Problem:   Slurred speech Observation/telemetry. Frequent neuro checks. Swallow screen. PT/OT/SLP. Check hemoglobin A1c and fasting lipids. Check carotid Doppler and echocardiogram. Check MR MRA of brain. Consult inpatient neurology if needed.  Active Problems:    AKI (acute kidney injury) ([place])  Likely due to hyperglycemia induced water loss. Continue IV fluids. Blood glucose control. Follow-up renal function and electrolytes.    Essential hypertension, benign Allow permissive hypertension. Hold metoprolol, furosemide and lisinopril. Monitor blood pressure.    Uncontrolled type 2 diabetes mellitus with hyperglycemia  (HCC) Carbohydrate modified diet. Continue IV fluids. CBG monitoring regular insulin sliding scale in the hospital. Continue Januvia 50 mg p.o. daily. Continue Lantus 40 units SQ at bedtime.     Mixed hyperlipidemia Continue simvastatin 20 mg p.o. every evening. Follow LFTs as needed.    Diabetic neuropathy (HCC) Continue gabapentin 300 mg p.o. 3 times daily     DVT prophylaxis: Heparin SQ. Code Status: Full code. Family Communication: Her daughter was present in her room and help with the history. Disposition Plan: Observation for TIA work-up. Consults called: Admission status: Observation/telemetry.   Reubin Milan MD [facility] Pager [phone].  If 7PM-7AM, please contact night-coverage [email or web address] Password [facility]  11/17/2018, 4:34 AM

## 2018-11-17 NOTE — Consult Note (Signed)
I   [facility] Tele[facility]urology Consult Services  Impression: Patient is an 84 yo female with a PMH of HTN, HLD, DM who presents with a two day history of porgressive dysarthria and an episode of transient right hand incoordination this evening. Fall on 11/14/18. LNK 11/14/18. She is not a candidate for TPA or NIR. On exam mild right NLF flattening, dysarthria and possible RLQ field cut-minimal. CTH negative for acute pathology. Possible stroke, postconcussive syndrome.   Recommendations:  Rec admission for stroke work-up. Consider MRI brain w/o, CUS vs CTA head and neck, or MRA head neck, lipid panel, A1C ST,OT, PT DVT ppx Begin ASA 81mg  daily.  Rec d/w ED MD.  ---------------------------------------------------------------------  CC: possible stroke  History of Present Illness:   Patient is an 84 yo female with a PMH of HTN, HLD, DM who presents with a two day history of porgressive dysarthria and an episode of transient right hand incoordination this evening. She rpeorts that early Saturday AM she noticed mild slurring of her speech. This has persisted, acutely worsening this evening. She sustained a fall yesterday and possibly hit her head. No LOC reported. At baseline she has limited ambulation due to OA, she has not noticed new weakness. This evening 11/24 when attempting to call her son she was unable to dial the numbers on her cell phone she could not oordinate her finger of her right hand. This has resolved. No h/o stroke. No associated headache, vision loss, limb weakness, aphasia, diplopia, limb or facial numbness. No h/o stroke or TIA  Diagnostic Testing: As above  Vital Signs:   Reviewed  Exam:  Mental Status:  Awake, alert, oriented  Naming: Intact Repetition: Intact   Speech: Mild dysarthria  Cranial Nerves:  Pupils: Equal round and reactive to light Extraocular movements: Intact in all cardinal gaze Ptosis: Absent Visual fields: Intact to finger counting, ?RLQ  mild loss Facial sensation: Intact to pin and light touch Facial movements: Minimal right NLF flattening  Motor Exam:  No drift   Tremor/Abnormal Movements:  Resting tremor: Absent Intention tremor: Absent Postural tremor: Absent  Sensory Exam:   Light touch: Intact Pinprick: Intact    Coordination:   Finger to nose: Intact Heel to shin: Intact  NIHSS of 3  Medical Decision Making:  - Extensive number of diagnosis or management options are considered above.   - Extensive amount of complex data reviewed.   - High risk of complication and/or morbidity or mortality are associated with differential diagnostic considerations above.  - There may be uncertain outcome and increased probability of prolonged functional impairment or high probability of severe prolonged functional impairment associated with some of these differential diagnosis.   Medical Data Reviewed:  1.Data reviewed include clinical labs, radiology,  Medical Tests;   2.Tests results discussed w/performing or interpreting physician;   3.Obtaining/reviewing old medical records;  4.Obtaining case history from another source;  5.Independent review of image, tracing or specimen.    Patient was informed the Neurology Consult would happen via telehealth (remote video) and consented to receiving care in this manner.

## 2018-11-17 NOTE — ED Provider Notes (Addendum)
[facility] EMERGENCY DEPARTMENT Provider Note   CSN: [id number] Arrival date & time: 11/17/18  0036     History   Chief Complaint Chief Complaint  Patient presents with  . Aphasia    HPI [name] is a 84 y.o. female.  HPI 84 year old female with history of diabetes, hypertension comes in with chief complaint of slurred speech. Patient reports that she started noticing some difficulty in speaking on Saturday, however her symptoms became more pronounced on 04-06-23 evening.  Prior to ED arrival, she started feeling unwell and tried to call her grandson but she was unable to use her hands to make the phone call.  Patient admits to having a fall early on 2023/04/06 morning.  She denies any specific headache.  Besides the speech difficulty, patient denies any new vision change, numbness, tingling, dizziness, weakness.  Past Medical History:  Diagnosis Date  . Diabetes mellitus without complication ([place])   . Diabetic neuropathy ([place])   . Hypertension   . Urinary incontinence     Patient Active Problem List   Diagnosis Date Noted  . Slurred speech 11/17/2018  . Uncontrolled type 2 diabetes mellitus with hyperglycemia ([place]) 06/11/2018  . Mixed hyperlipidemia 02/20/2016  . Essential hypertension, benign 10/10/2015  . Vitamin D deficiency 10/10/2015  . Class 1 obesity due to excess calories with serious comorbidity and body mass index (BMI) of 30.0 to 30.9 in adult 10/10/2015  . HIP PAIN 01/12/2009  . SPINAL STENOSIS 01/12/2009  . LOW BACK PAIN 01/12/2009  . SPONDYLOLYSIS 01/12/2009  . SHOULDER PAIN 10/04/2008  . Uncontrolled type 2 diabetes mellitus with complication, without long-term current use of insulin ([place]) 10/01/2008    Past Surgical History:  Procedure Laterality Date  . ABDOMINAL HYSTERECTOMY    . APPENDECTOMY    . BREAST SURGERY     begnin tumor removed  . CATARACT EXTRACTION, BILATERAL       OB History   None      Home Medications    Prior to  Admission medications   Medication Sig Start Date End Date Taking? Authorizing Provider  Cholecalciferol (VITAMIN D3) 5000 units CAPS Take 1 capsule (5,000 Units total) by mouth daily. 04/15/17   Cassandria Anger, MD  clonazePAM (KLONOPIN) 0.5 MG tablet Take 0.5 tablets (0.25 mg total) by mouth daily. 10/07/18   Tat, Eustace Quail, DO  furosemide (LASIX) 20 MG tablet Take 20 mg by mouth daily as needed (for fluid retention).     [provider]  gabapentin (NEURONTIN) 300 MG capsule Take 1 capsule (300 mg total) by mouth 3 (three) times daily. 07/11/18   Cassandria Anger, MD  glucose blood (ONETOUCH VERIO) test strip Use as instructed tid. E11.65 10/02/18   Cassandria Anger, MD  Insulin Glargine (LANTUS SOLOSTAR) 100 UNIT/ML Solostar Pen Inject 40 Units into the skin at bedtime. 10/02/18   Cassandria Anger, MD  Insulin Pen Needle (B-D ULTRAFINE III SHORT PEN) 31G X 8 MM MISC 1 each by Does not apply route as directed. 02/26/18   Cassandria Anger, MD  JANUVIA 50 MG tablet TAKE 1 TABLET BY MOUTH  DAILY 05/01/18   Cassandria Anger, MD  lisinopril (PRINIVIL,ZESTRIL) 20 MG tablet Take 20 mg by mouth 2 (two) times daily.    [provider]  metoprolol succinate (TOPROL-XL) 50 MG 24 hr tablet Take 50 mg by mouth daily. Take with or immediately following a meal.    [provider]  MYRBETRIQ 50 MG TB24  tablet Take 50 mg by mouth daily. 09/02/14   [provider]  simvastatin (ZOCOR) 20 MG tablet Take 20 mg by mouth every evening.    [provider]  tiZANidine (ZANAFLEX) 2 MG tablet Take by mouth every 6 (six) hours as needed for muscle spasms.    [provider]  zolpidem (AMBIEN) 10 MG tablet Take 10 mg by mouth at bedtime as needed for sleep.    [provider]    Family History Family History  Problem Relation Age of Onset  . CAD Mother   . Hypertension Mother   . Stroke Father   . Heart attack Brother   . Cancer  Brother     Social History Social History   Tobacco Use  . Smoking status: Never Smoker  . Smokeless tobacco: Never Used  Substance Use Topics  . Alcohol use: No  . Drug use: No     Allergies   Codeine   Review of Systems Review of Systems  Constitutional: Positive for activity change.  Respiratory: Negative for shortness of breath.   Cardiovascular: Negative for chest pain.  Neurological: Positive for speech difficulty.  All other systems reviewed and are negative.    Physical Exam Updated Vital Signs BP (!) 133/55   Pulse (!) 58   Temp (!) 97.5 F (36.4 C) (Oral)   Resp 16   SpO2 96%   Physical Exam  Constitutional: She is oriented to person, place, and time. She appears well-developed.  HENT:  Head: Normocephalic and atraumatic.  Eyes: EOM are normal.  Neck: Normal range of motion. Neck supple.  Cardiovascular: Normal rate.  Pulmonary/Chest: Effort normal.  Abdominal: Bowel sounds are normal.  Neurological: She is alert and oriented to person, place, and time.  Patient is noted to have slight left-sided facial droop along with slurring of her speech.  Otherwise there is no upper extremity or lower extremity gross motor deficits and patient has normal sensation.  Cerebellar exam reveals no dysmetria and extraocular muscles are intact  Skin: Skin is warm and dry.  Nursing note and vitals reviewed.    ED Treatments / Results  Labs (all labs ordered are listed, but only abnormal results are displayed) Labs Reviewed  BASIC METABOLIC PANEL - Abnormal; Notable for the following components:      Result Value   Sodium 132 (*)    Chloride 95 (*)    Glucose, Bld 568 (*)    BUN 25 (*)    Creatinine, Ser 2.17 (*)    GFR calc non Af Amer 19 (*)    GFR calc Af Amer 22 (*)    All other components within normal limits  CBG MONITORING, ED - Abnormal; Notable for the following components:   Glucose-Capillary 515 (*)    All other components within normal limits    CBC  PROTIME-INR  APTT  DIFFERENTIAL  URINALYSIS, ROUTINE W REFLEX MICROSCOPIC  MAGNESIUM  PHOSPHORUS  HEPATIC FUNCTION PANEL  HEMOGLOBIN A1C  LIPID PANEL  I-STAT TROPONIN, ED    EKG EKG Interpretation  Date/Time:  Monday November 17 2018 00:46:15 EST Ventricular Rate:  59 PR Interval:    QRS Duration: 130 QT Interval:  468 QTC Calculation: 464 R Axis:   80 Text Interpretation:  Sinus rhythm Right bundle branch block No acute changes No significant change since last tracing Confirmed by Varney Biles ([id number]) on 11/17/2018 2:07:24 AM   Radiology Ct Head Wo Contrast  Result Date: 11/17/2018 CLINICAL DATA:  Slurred speech  and left facial droop. EXAM: CT HEAD WITHOUT CONTRAST TECHNIQUE: Contiguous axial images were obtained from the base of the skull through the vertex without intravenous contrast. COMPARISON:  None. FINDINGS: Brain: There is no mass, hemorrhage or extra-axial collection. The size and configuration of the ventricles and extra-axial CSF spaces are normal for age. The brain parenchyma is normal, without evidence of acute or chronic infarction. Vascular: Atherosclerotic calcification of the internal carotid arteries at the skull base. No abnormal hyperdensity of the major intracranial arteries or dural venous sinuses. Skull: The visualized skull base, calvarium and extracranial soft tissues are normal. Sinuses/Orbits: No fluid levels or advanced mucosal thickening of the visualized paranasal sinuses. No mastoid or middle ear effusion. The orbits are normal. IMPRESSION: Normal aging brain. Electronically Signed   By: Ulyses Jarred M.D.   On: 11/17/2018 02:17    Procedures Procedures (including critical care time)  Medications Ordered in ED Medications  sodium chloride 0.9 % bolus 1,000 mL (has no administration in time range)   stroke: mapping our early stages of recovery book (has no administration in time range)  acetaminophen (TYLENOL) tablet 650 mg (has no  administration in time range)  heparin injection 5,000 Units (has no administration in time range)  aspirin suppository 300 mg (has no administration in time range)    Or  aspirin tablet 325 mg (has no administration in time range)  ondansetron (ZOFRAN) tablet 4 mg (has no administration in time range)    Or  ondansetron (ZOFRAN) injection 4 mg (has no administration in time range)  acetaminophen (TYLENOL) tablet 650 mg (has no administration in time range)    Or  acetaminophen (TYLENOL) suppository 650 mg (has no administration in time range)     Initial Impression / Assessment and Plan / ED Course  I have reviewed the triage vital signs and the nursing notes.  Pertinent labs & imaging results that were available during my care of the patient were reviewed by me and considered in my medical decision making (see chart for details).  Clinical Course as of Nov 17 432  [facility] Nov 17, 2018  9147 Anion gap is 10.  Admitting team to manage the elevated blood sugar.  Glucose(!!): 568 [AN]  [id number] Telemetry neurology recommends that patient be admitted for further stroke work-up.   [AN]  8295 Patient's baseline creatinine is about 1.2.  We will give her IV fluid.  Creatinine(!): 2.17 [AN]    Clinical Course User Index [AN] Varney Biles, MD    DDx includes:  Stroke - ischemic vs. hemorrhagic TIA Neuropathy Electrolyte abnormality Muscular disease  84 year old female with history of diabetes, hypertension and hyperlipidemia comes in with chief complaint of slurred speech and an event where she was unable to call her grandson.  She is noted to have mild dysarthria and left-sided facial droop.  Patient is well outside of any stroke intervention window.  Teleneurology has been consulted.   Final Clinical Impressions(s) / ED Diagnoses   Final diagnoses:  Cerebrovascular accident (CVA), unspecified mechanism ([place])  AKI (acute kidney injury) [facility])    ED Discharge Orders    None         Varney Biles, MD 11/17/18 6213    Varney Biles, MD 11/17/18 [phone]

## 2018-11-17 NOTE — Plan of Care (Signed)
  Problem: Acute Rehab OT Goals (only OT should resolve) Goal: Pt. Will Perform Eating Flowsheets (Taken 11/17/2018 1240) Pt Will Perform Eating: with modified independence; sitting Goal: Pt. Will Perform Grooming Flowsheets (Taken 11/17/2018 1240) Pt Will Perform Grooming: with supervision; sitting; standing Goal: Pt. Will Perform Upper Body Dressing Flowsheets (Taken 11/17/2018 1240) Pt Will Perform Upper Body Dressing: with modified independence; sitting Goal: Pt. Will Perform Lower Body Dressing Flowsheets (Taken 11/17/2018 1240) Pt Will Perform Lower Body Dressing: with modified independence; sitting/lateral leans; sit to/from stand Goal: Pt. Will Transfer To Toilet Flowsheets (Taken 11/17/2018 1240) Pt Will Transfer to Toilet: with modified independence; stand pivot transfer; ambulating; regular height toilet; bedside commode Goal: Pt. Will Perform Toileting-Clothing Manipulation Flowsheets (Taken 11/17/2018 1240) Pt Will Perform Toileting - Clothing Manipulation and hygiene: with modified independence; sitting/lateral leans; sit to/from stand Goal: Pt/Caregiver Will Perform Home Exercise Program Flowsheets (Taken 11/17/2018 1240) Pt/caregiver will Perform Home Exercise Program: Increased strength; Both right and left upper extremity; With written HEP provided

## 2018-11-17 NOTE — ED Notes (Signed)
Tele neuro doctor assessing patient at this time

## 2018-11-17 NOTE — ED Notes (Signed)
Loma Sousa RN given report

## 2018-11-17 NOTE — Progress Notes (Signed)
Inpatient Diabetes Program Recommendations  AACE/ADA: New Consensus Statement on Inpatient Glycemic Control (2015)  Target Ranges:  Prepandial:   less than 140 mg/dL      Peak postprandial:   less than 180 mg/dL (1-2 hours)      Critically ill patients:  140 - 180 mg/dL   Results for [name], [name] (MRN [id number]) as of 11/17/2018 08:50  Ref. Range 11/17/2018 00:40 11/17/2018 05:02 11/17/2018 05:58 11/17/2018 08:44  Glucose-Capillary Latest Ref Range: 70 - 99 mg/dL 515 (HH) 503 (HH) 433 (H)  8 units NOVOLOG given at 7:02am 395 (H)  20 units NOVOLOG    Results for [name], [name] (MRN [id number]) as of 11/17/2018 08:50  Ref. Range 09/25/2018 11:26  Hemoglobin A1C Latest Ref Range: 4.8 - 5.6 % 8.8 (H)    Admit with: Slurred Speech/ Acute Kidney Injury  History: DM  Home DM Meds: Lantus 40 units QHS       Januvia 50 mg Daily  Current Orders: Novolog Resistant Correction Scale/ SSI (0-20 units) Q4 hours     MD- Please consider the following in-hospital insulin adjustments:  Start Lantus 30 units QHS (75% total home dose to start)     Note Novolog SSI started this AM.  Glucose 568 mg/dl on admission.  Last A1c on file was 8.8% just a little over a month ago.  Current A1c level pending.  Patient saw her Endocrinologist Dr. Dorris Fetch with [facility] on 10/02/2018. Was counseled to do the following: "Due to her presentation with significantly better glycemic profile and due to the desire to avoid hypoglycemia in this patient, she would not be given prandial insulin for now.   -She would benefit from continued simplified treatment regimen.   -She is advised to continue Lantus 40 units nightly,  associated with strict monitoring of blood glucose 2 times a day-before breakfast and at bedtime.   -I advised her to continue Januvia 50 mg p.o. daily with breakfast. -Patient is encouraged to call clinic for blood glucose levels less than 70 or above 300 mg  /dlx3."  Patient also met with Kieth Brightly Crumptom, CDE with Dr. Dorris Fetch for further DM diet counseling on 10/02/2018 as well.     --Will follow patient during hospitalization--  Wyn Quaker RN, MSN, CDE Diabetes Coordinator Inpatient Glycemic Control Team Team Pager: [phone] (8a-5p)

## 2018-11-17 NOTE — Progress Notes (Signed)
Per HPI from Dr. Olevia Bowens: [name] is a 84 y.o. female with medical history significant of diabetes mellitus without complication, diabetic neuropathy, hypertension, urinary incontinence who is coming with complaints of slurred speech since about 1300 on 11/15/2018.  The patient's daughter states that around this time, although she did not notice much slurred speech, she noticed that the patient was very quiet, which is not herself.  The patient's daughter went home and at around midnight, she received a phone call from the patient's husband stating that they were going to the hospital due to recurrence/worsening of slurred speech.  She denies headache, blurred vision, but feels mild lightheadedness.  Denies nausea, emesis, focal weakness or paresthesias on her extremities.  No fever, chills, sore throat, cough, hemoptysis, wheezing, chest pain, palpitations, diaphoresis, PND, orthopnea or recent pitting edema of the lower extremities.  No abdominal pain, diarrhea, constipation, melena or hematochezia.  Denies dysuria, frequency or hematuria.  Denies polyuria, polydipsia, polyphagia or blurred vision.  However, her symptoms may be due to hyperosmolar state.  Patient seen and evaluated this morning.  It appears that her speech disturbance is improving.  Daughter is currently at bedside and confirms the same.  MRI/MRI of the brain with no acute findings noted.  Continue on aspirin 81 mg daily.  Fasting lipid profile with LDL of 69.  2D echocardiogram as well as PT/OT evaluation pending.  Will resume diet once she passes stroke screen.  Anticipate DC in a.m if improved.

## 2018-11-17 NOTE — Progress Notes (Signed)
SLP Cancellation Note  Patient Details Name: [name] MRN: [id number] DOB: Jun 12, 1929   Cancelled treatment:       Reason Eval/Treat Not Completed: SLP screened, no needs identified, will sign off; SLP screened Pt in room. Pt denies any changes in swallowing, speech, language, or cognition. MRI negative for acute changes. SLE will be deferred at this time. Reconsult if indicated. SLP will sign off.   Thank you,  Genene Churn, [place]  [place] 11/17/2018, 12:52 PM

## 2018-11-17 NOTE — ED Triage Notes (Addendum)
Pt comes in via [facility]. Pt states around 1300 yesterday she began to notice some slurred speech. Pt states she did have a fall this AM around 0500. Pt denies any weakness in her extremities.

## 2018-11-17 NOTE — Evaluation (Signed)
Physical Therapy Evaluation Patient Details Name: [name] MRN: [id number] DOB: 04-12-1929 Today's Date: 11/17/2018   History of Present Illness  [name] is a 84 y.o. female with medical history significant of diabetes mellitus without complication, diabetic neuropathy, hypertension, urinary incontinence who is coming with complaints of slurred speech since about 1300 on 11/15/2018.  The patient's daughter states that around this time, although she did not notice much slurred speech, she noticed that the patient was very quiet, which is not herself.  The patient's daughter went home and at around midnight, she received a phone call from the patient's husband stating that they were going to the hospital due to recurrence/worsening of slurred speech.    Clinical Impression  Patient demonstrates slow labored movement for sitting up at bedside requiring much verbal/tactile cueing, limited to a few steps at bedside due to c/o fatigue, generalized soreness all over and weakness.  Patient taken for MRI after therapy.  Patient will benefit from continued physical therapy in hospital and recommended venue below to increase strength, balance, endurance for safe ADLs and gait.    Follow Up Recommendations SNF;Supervision/Assistance - 24 hour;Supervision for mobility/OOB    Equipment Recommendations  None recommended by PT    Recommendations for Other Services       Precautions / Restrictions Precautions Precautions: Fall Restrictions Weight Bearing Restrictions: No      Mobility  Bed Mobility Overal bed mobility: Needs Assistance Bed Mobility: Supine to Sit;Sit to Supine     Supine to sit: Mod assist Sit to supine: Min assist   General bed mobility comments: slow labored movement for sitting up at bedside  Transfers Overall transfer level: Needs assistance Equipment used: Rolling walker (2 wheeled);None;1 person hand held assist Transfers: Sit to/from  [facility] Sit to Stand: Min assist Stand pivot transfers: Min assist       General transfer comment: very unsteady on feet, safer using RW  Ambulation/Gait Ambulation/Gait assistance: Mod assist Gait Distance (Feet): 3 Feet Assistive device: Rolling walker (2 wheeled);1 person hand held assist Gait Pattern/deviations: Decreased step length - right;Decreased step length - left;Decreased stride length Gait velocity: slow   General Gait Details: limited to 4-5 slow unsteady steps at bedside due to c/o fatigue and generalized weakness  Stairs            Wheelchair Mobility    Modified Rankin (Stroke Patients Only)       Balance Overall balance assessment: Needs assistance Sitting-balance support: Feet supported;No upper extremity supported Sitting balance-Leahy Scale: Good     Standing balance support: During functional activity;No upper extremity supported Standing balance-Leahy Scale: Poor Standing balance comment: fair using RW                             Pertinent Vitals/Pain Pain Assessment: Faces Faces Pain Scale: Hurts a little bit Pain Location: generalized pain all over, mostly in right anterior lower leg due to bumping into bed during fall at home Pain Descriptors / Indicators: Sore Pain Intervention(s): Limited activity within patient's tolerance;Monitored during session    Home Living Family/patient expects to be discharged to:: Private residence Living Arrangements: Spouse/significant other Available Help at Discharge: Family;Available PRN/intermittently Type of Home: House         Home Equipment: Walker - 2 wheels;Cane - single point      Prior Function Level of Independence: Independent with assistive device(s)  Comments: short distanced household ambulation using RW, SPC or leaning on furniture, walls for support, uses wheelchair for longer distances     Hand Dominance   Dominant Hand: Right     Extremity/Trunk Assessment   Upper Extremity Assessment Upper Extremity Assessment: Defer to OT evaluation    Lower Extremity Assessment Lower Extremity Assessment: Generalized weakness    Cervical / Trunk Assessment Cervical / Trunk Assessment: Normal  Communication   Communication: No difficulties  Cognition Arousal/Alertness: Awake/alert Behavior During Therapy: WFL for tasks assessed/performed Overall Cognitive Status: Within Functional Limits for tasks assessed                                        General Comments      Exercises     Assessment/Plan    PT Assessment Patient needs continued PT services  PT Problem List Decreased strength;Decreased activity tolerance;Decreased balance;Decreased mobility       PT Treatment Interventions Gait training;Stair training;Functional mobility training;Therapeutic activities;Therapeutic exercise;Patient/family education    PT Goals (Current goals can be found in the Care Plan section)  Acute Rehab PT Goals Patient Stated Goal: to return home PT Goal Formulation: With patient/family Time For Goal Achievement: 12/03/18 Potential to Achieve Goals: Good    Frequency Min 5X/week   Barriers to discharge        Co-evaluation   Reason for Co-Treatment: Complexity of the patient's impairments (multi-system involvement)   OT goals addressed during session: ADL's and self-care;Proper use of Adaptive equipment and DME       AM-PAC PT "6 Clicks" Mobility  Outcome Measure Help needed turning from your back to your side while in a flat bed without using bedrails?: Total Help needed moving from lying on your back to sitting on the side of a flat bed without using bedrails?: Total Help needed moving to and from a bed to a chair (including a wheelchair)?: Total Help needed standing up from a chair using your arms (e.g., wheelchair or bedside chair)?: A Lot Help needed to walk in hospital room?: A Lot Help  needed climbing 3-5 steps with a railing? : A Lot 6 Click Score: 9    End of Session   Activity Tolerance: Patient tolerated treatment well;Patient limited by fatigue Patient left: in bed;with nursing/sitter in room Nurse Communication: Mobility status PT Visit Diagnosis: Unsteadiness on feet (R26.81);Other abnormalities of gait and mobility (R26.89);Muscle weakness (generalized) (M62.81)    Time: 9983-3825 PT Time Calculation (min) (ACUTE ONLY): 30 min   Charges:   PT Evaluation $PT Eval Moderate Complexity: 1 Mod PT Treatments $Therapeutic Activity: 23-37 mins        12:18 PM, 11/17/18 Lonell Grandchild, MPT Physical Therapist with [facility] [phone] office [phone] mobile phone

## 2018-11-17 NOTE — Care Management Obs Status (Signed)
[place] NOTIFICATION   Patient Details  Name: [name] MRN: [id number] Date of Birth: [birth year]   Medicare Observation Status Notification Given:  Yes    Shelda Altes 11/17/2018, 1:47 PM

## 2018-11-17 NOTE — Plan of Care (Signed)
  Problem: Acute Rehab PT Goals(only PT should resolve) Goal: Pt Will Go Supine/Side To Sit Outcome: Progressing Flowsheets (Taken 11/17/2018 1219) Pt will go Supine/Side to Sit: with min guard assist Goal: Patient Will Transfer Sit To/From Stand Outcome: Progressing Flowsheets (Taken 11/17/2018 1219) Patient will transfer sit to/from stand: with min guard assist Goal: Pt Will Transfer Bed To Chair/Chair To Bed Outcome: Progressing Flowsheets (Taken 11/17/2018 1219) Pt will Transfer Bed to Chair/Chair to Bed: min guard assist Goal: Pt Will Ambulate Outcome: Progressing Flowsheets (Taken 11/17/2018 1219) Pt will Ambulate: 25 feet; with minimal assist; with rolling walker   12:20 PM, 11/17/18 Lonell Grandchild, MPT Physical Therapist with [facility] [phone] office [phone] mobile phone

## 2018-11-18 DIAGNOSIS — R471 Dysarthria and anarthria: Secondary | ICD-10-CM | POA: Diagnosis not present

## 2018-11-18 DIAGNOSIS — R4781 Slurred speech: Secondary | ICD-10-CM | POA: Diagnosis not present

## 2018-11-18 LAB — CBC
HCT: 40 % (ref 36.0–46.0)
Hemoglobin: 13.1 g/dL (ref 12.0–15.0)
MCH: 29.4 pg (ref 26.0–34.0)
MCHC: 32.8 g/dL (ref 30.0–36.0)
MCV: 89.9 fL (ref 80.0–100.0)
Platelets: 193 10*3/uL (ref 150–400)
RBC: 4.45 MIL/uL (ref 3.87–5.11)
RDW: 13.1 % (ref 11.5–15.5)
WBC: 9.3 10*3/uL (ref 4.0–10.5)
nRBC: 0 % (ref 0.0–0.2)

## 2018-11-18 LAB — BASIC METABOLIC PANEL
Anion gap: 8 (ref 5–15)
BUN: 21 mg/dL (ref 8–23)
CO2: 27 mmol/L (ref 22–32)
Calcium: 9.1 mg/dL (ref 8.9–10.3)
Chloride: 106 mmol/L (ref 98–111)
Creatinine, Ser: 1.42 mg/dL — ABNORMAL HIGH (ref 0.44–1.00)
GFR calc Af Amer: 37 mL/min — ABNORMAL LOW (ref >60)
GFR calc non Af Amer: 32 mL/min — ABNORMAL LOW (ref >60)
Glucose, Bld: 210 mg/dL — ABNORMAL HIGH (ref 70–99)
Potassium: 3.3 mmol/L — ABNORMAL LOW (ref 3.5–5.1)
Sodium: 141 mmol/L (ref 135–145)

## 2018-11-18 LAB — GLUCOSE, CAPILLARY
Glucose-Capillary: 172 mg/dL — ABNORMAL HIGH (ref 70–99)
Glucose-Capillary: 191 mg/dL — ABNORMAL HIGH (ref 70–99)
Glucose-Capillary: 286 mg/dL — ABNORMAL HIGH (ref 70–99)
Glucose-Capillary: 289 mg/dL — ABNORMAL HIGH (ref 70–99)

## 2018-11-18 MED ORDER — NYSTATIN 100000 UNIT/GM EX POWD
Freq: Three times a day (TID) | CUTANEOUS | Status: DC
  Administered 2018-11-18: 10:00:00 via TOPICAL
  Filled 2018-11-18 (×2): qty 15

## 2018-11-18 MED ORDER — ASPIRIN 81 MG PO CHEW: 81.0000 mg | CHEWABLE_TABLET | Freq: Every day | ORAL | 0 refills | Status: AC

## 2018-11-18 MED ORDER — POTASSIUM CHLORIDE CRYS ER 20 MEQ PO TBCR
40.0000 meq | EXTENDED_RELEASE_TABLET | Freq: Once | ORAL | Status: AC
  Administered 2018-11-18: 40 meq via ORAL
  Filled 2018-11-18: qty 2

## 2018-11-18 NOTE — Plan of Care (Signed)
Problem: Education: Goal: Knowledge of General Education information will improve Description Including pain rating scale, medication(s)/side effects and non-pharmacologic comfort measures 11/18/2018 1421 by Rance Muir, RN Outcome: Adequate for Discharge 11/18/2018 1138 by Rance Muir, RN Outcome: Progressing   Problem: Health Behavior/Discharge Planning: Goal: Ability to manage health-related needs will improve 11/18/2018 1421 by Rance Muir, RN Outcome: Adequate for Discharge 11/18/2018 1138 by Rance Muir, RN Outcome: Progressing   Problem: Clinical Measurements: Goal: Ability to maintain clinical measurements within normal limits will improve 11/18/2018 1421 by Rance Muir, RN Outcome: Adequate for Discharge 11/18/2018 1138 by Rance Muir, RN Outcome: Progressing Goal: Will remain free from infection 11/18/2018 1421 by Rance Muir, RN Outcome: Adequate for Discharge 11/18/2018 1138 by Rance Muir, RN Outcome: Progressing Goal: Diagnostic test results will improve 11/18/2018 1421 by Rance Muir, RN Outcome: Adequate for Discharge 11/18/2018 1138 by Rance Muir, RN Outcome: Progressing Goal: Respiratory complications will improve 11/18/2018 1421 by Rance Muir, RN Outcome: Adequate for Discharge 11/18/2018 1138 by Rance Muir, RN Outcome: Progressing Goal: Cardiovascular complication will be avoided 11/18/2018 1421 by Rance Muir, RN Outcome: Adequate for Discharge 11/18/2018 1138 by Rance Muir, RN Outcome: Progressing   Problem: Activity: Goal: Risk for activity intolerance will decrease 11/18/2018 1421 by Rance Muir, RN Outcome: Adequate for Discharge 11/18/2018 1138 by Rance Muir, RN Outcome: Progressing   Problem: Nutrition: Goal: Adequate nutrition will be maintained 11/18/2018 1421 by Rance Muir, RN Outcome: Adequate for Discharge 11/18/2018 1138 by Rance Muir, RN Outcome: Progressing   Problem: Coping: Goal: Level of anxiety will decrease 11/18/2018 1421 by Rance Muir,  RN Outcome: Adequate for Discharge 11/18/2018 1138 by Rance Muir, RN Outcome: Progressing   Problem: Elimination: Goal: Will not experience complications related to bowel motility 11/18/2018 1421 by Rance Muir, RN Outcome: Adequate for Discharge 11/18/2018 1138 by Rance Muir, RN Outcome: Progressing Goal: Will not experience complications related to urinary retention 11/18/2018 1421 by Rance Muir, RN Outcome: Adequate for Discharge 11/18/2018 1138 by Rance Muir, RN Outcome: Progressing   Problem: Pain Managment: Goal: General experience of comfort will improve 11/18/2018 1421 by Rance Muir, RN Outcome: Adequate for Discharge 11/18/2018 1138 by Rance Muir, RN Outcome: Progressing   Problem: Safety: Goal: Ability to remain free from injury will improve 11/18/2018 1421 by Rance Muir, RN Outcome: Adequate for Discharge 11/18/2018 1138 by Rance Muir, RN Outcome: Progressing   Problem: Skin Integrity: Goal: Risk for impaired skin integrity will decrease 11/18/2018 1421 by Rance Muir, RN Outcome: Adequate for Discharge 11/18/2018 1138 by Rance Muir, RN Outcome: Progressing   Problem: Education: Goal: Knowledge of disease or condition will improve 11/18/2018 1421 by Rance Muir, RN Outcome: Adequate for Discharge 11/18/2018 1138 by Rance Muir, RN Outcome: Progressing Goal: Knowledge of secondary prevention will improve 11/18/2018 1421 by Rance Muir, RN Outcome: Adequate for Discharge 11/18/2018 1138 by Rance Muir, RN Outcome: Progressing Goal: Knowledge of patient specific risk factors addressed and post discharge goals established will improve 11/18/2018 1421 by Rance Muir, RN Outcome: Adequate for Discharge 11/18/2018 1138 by Rance Muir, RN Outcome: Progressing Goal: Individualized Educational Video(s) 11/18/2018 1421 by Rance Muir, RN Outcome: Adequate for Discharge 11/18/2018 1138 by Rance Muir, RN Outcome: Progressing   Problem: Coping: Goal: Will verbalize positive feelings  about self 11/18/2018 1421 by Rance Muir, RN Outcome: Adequate for Discharge 11/18/2018 1138 by Rance Muir, RN Outcome: Progressing Goal: Will identify appropriate support needs 11/18/2018 1421 by Rance Muir, RN Outcome: Adequate for Discharge 11/18/2018 1138 by Rance Muir, RN Outcome: Progressing  Problem: Health Behavior/Discharge Planning: Goal: Ability to manage health-related needs will improve 11/18/2018 1421 by Rance Muir, RN Outcome: Adequate for Discharge 11/18/2018 1138 by Rance Muir, RN Outcome: Progressing   Problem: Self-Care: Goal: Ability to participate in self-care as condition permits will improve 11/18/2018 1421 by Rance Muir, RN Outcome: Adequate for Discharge 11/18/2018 1138 by Rance Muir, RN Outcome: Progressing Goal: Verbalization of feelings and concerns over difficulty with self-care will improve 11/18/2018 1421 by Rance Muir, RN Outcome: Adequate for Discharge 11/18/2018 1138 by Rance Muir, RN Outcome: Progressing Goal: Ability to communicate needs accurately will improve 11/18/2018 1421 by Rance Muir, RN Outcome: Adequate for Discharge 11/18/2018 1138 by Rance Muir, RN Outcome: Progressing   Problem: Nutrition: Goal: Risk of aspiration will decrease 11/18/2018 1421 by Rance Muir, RN Outcome: Adequate for Discharge 11/18/2018 1138 by Rance Muir, RN Outcome: Progressing Goal: Dietary intake will improve 11/18/2018 1421 by Rance Muir, RN Outcome: Adequate for Discharge 11/18/2018 1138 by Rance Muir, RN Outcome: Progressing   Problem: Ischemic Stroke/TIA Tissue Perfusion: Goal: Complications of ischemic stroke/TIA will be minimized 11/18/2018 1421 by Rance Muir, RN Outcome: Adequate for Discharge 11/18/2018 1138 by Rance Muir, RN Outcome: Progressing

## 2018-11-18 NOTE — Care Management Note (Signed)
Case Management Note  Patient Details  Name: [name] MRN: [id number] Date of Birth: 06/13/1929  Subjective/Objective:   Observation for slurred speech. Pt from home, lives with husband. Pt seen by CSW  PT recommends SNF and pt has refused, family supportive of this decision. Per CSW would like HH through [facility].                  Action/Plan: DC home today. Referral given to [name] rep, Vaughan Basta, who will pull pt info from chart.   Expected Discharge Date:  11/18/18               Expected Discharge Plan:  [place]  In-House Referral:  NA  Discharge planning Services  CM Consult  Post Acute Care Choice:  Home Health Choice offered to:  NA  HH Arranged:  PT [place] Agency:  [place]  Status of Service:  Completed, signed off  If discussed at [place] of Stay Meetings, dates discussed:    Additional Comments:  Sherald Barge, RN 11/18/2018, 2:20 PM

## 2018-11-18 NOTE — Clinical Social Work Note (Signed)
Late Entry for 11/17/14. Patient stated that she lives in the home with her husband. At baseline, patient and husband complete housework together. She is independent in her ADLs and still drives.  Her son, [name], was at bedside.  LCSW discussed discharge plan with son who was agreeable.   LCSW signing off.    Ondra Deboard, Clydene Pugh, LCSW

## 2018-11-18 NOTE — Plan of Care (Signed)
  Problem: Education: Goal: Knowledge of General Education information will improve Description Including pain rating scale, medication(s)/side effects and non-pharmacologic comfort measures Outcome: Progressing   Problem: Health Behavior/Discharge Planning: Goal: Ability to manage health-related needs will improve Outcome: Progressing   Problem: Clinical Measurements: Goal: Ability to maintain clinical measurements within normal limits will improve Outcome: Progressing Goal: Will remain free from infection Outcome: Progressing Goal: Diagnostic test results will improve Outcome: Progressing Goal: Respiratory complications will improve Outcome: Progressing Goal: Cardiovascular complication will be avoided Outcome: Progressing   Problem: Activity: Goal: Risk for activity intolerance will decrease Outcome: Progressing   Problem: Nutrition: Goal: Adequate nutrition will be maintained Outcome: Progressing   Problem: Coping: Goal: Level of anxiety will decrease Outcome: Progressing   Problem: Elimination: Goal: Will not experience complications related to bowel motility Outcome: Progressing Goal: Will not experience complications related to urinary retention Outcome: Progressing   Problem: Pain Managment: Goal: General experience of comfort will improve Outcome: Progressing   Problem: Safety: Goal: Ability to remain free from injury will improve Outcome: Progressing   Problem: Skin Integrity: Goal: Risk for impaired skin integrity will decrease Outcome: Progressing   Problem: Education: Goal: Knowledge of disease or condition will improve Outcome: Progressing Goal: Knowledge of secondary prevention will improve Outcome: Progressing Goal: Knowledge of patient specific risk factors addressed and post discharge goals established will improve Outcome: Progressing Goal: Individualized Educational Video(s) Outcome: Progressing   Problem: Coping: Goal: Will verbalize  positive feelings about self Outcome: Progressing Goal: Will identify appropriate support needs Outcome: Progressing   Problem: Health Behavior/Discharge Planning: Goal: Ability to manage health-related needs will improve Outcome: Progressing   Problem: Self-Care: Goal: Ability to participate in self-care as condition permits will improve Outcome: Progressing Goal: Verbalization of feelings and concerns over difficulty with self-care will improve Outcome: Progressing Goal: Ability to communicate needs accurately will improve Outcome: Progressing   Problem: Ischemic Stroke/TIA Tissue Perfusion: Goal: Complications of ischemic stroke/TIA will be minimized Outcome: Progressing   Problem: Nutrition: Goal: Risk of aspiration will decrease Outcome: Progressing Goal: Dietary intake will improve Outcome: Progressing

## 2018-11-18 NOTE — Discharge Summary (Signed)
Physician Discharge Summary  [name] DGU:[id number] DOB: [birth year] DOA: 11/17/2018  PCP: Sharilyn Sites, MD  Admit date: 11/17/2018  Discharge date: 11/18/2018  Admitted From:Home  Disposition:  Home  Recommendations for Outpatient Follow-up:  1. Follow up with PCP in 1-2 weeks 2. Remain on aspirin 81 mg daily. 3. Patient did not want to go to inpatient rehabilitation and would rather go home with home health and has walker and wheelchair at home  Home Health: PT  Equipment/Devices: Wheelchair and walker at home  Discharge Condition: Stable  CODE STATUS: Full  Diet recommendation: Heart Healthy  Brief/Interim Summary: Per HPI from Dr. Olevia Bowens: [name] a 84 y.o.femalewith medical history significant of diabetes mellitus without complication, diabetic neuropathy, hypertension, urinary incontinence who is coming with complaints of slurred speech since about 1300 on 11/15/2018. The patient's daughter states that around this time, although she did not notice much slurred speech, she noticed that the patient was very quiet, which is not herself. The patient's daughter went home and at around midnight, she received a phone call from the patient's husband stating that they were going to the hospital due to recurrence/worsening of slurred speech. She denies headache, blurred vision, but feels mild lightheadedness. Denies nausea, emesis, focal weakness or paresthesias on her extremities. No fever, chills, sore throat, cough, hemoptysis, wheezing, chest pain, palpitations, diaphoresis, PND, orthopnea or recent pitting edema of the lower extremities. No abdominal pain, diarrhea, constipation, melena or hematochezia. Denies dysuria, frequency or hematuria. Denies polyuria, polydipsia, polyphagia or blurred vision. However, her symptoms may be due to hyperosmolar state.  Patient was admitted for CVA work-up and brain MRI/MRA with no acute findings noted.  2D  echocardiogram with LVEF 60 to 65% no other findings.  Ultrasound of the carotids without any significant stenosis and LDL of 69.  Continue aspirin 81 mg daily along with home statin.  PT/OT evaluation with recommendations for SNF/rehab placement which patient declines at this time.  She would like to go home and home health physical therapy will be set up for her.  No other acute events noted during the course of this admission.  Her speech deficit had cleared up quite quickly throughout the course of her admission.  Discharge Diagnoses:  Principal Problem:   Slurred speech Active Problems:   Essential hypertension, benign   Mixed hyperlipidemia   Uncontrolled type 2 diabetes mellitus with hyperglycemia (HCC)   Diabetic neuropathy (HCC)   AKI (acute kidney injury) ([place])  Principal discharge diagnosis: Dysarthria likely secondary to TIA.  Discharge Instructions  Discharge Instructions    Diet - low sodium heart healthy   Complete by:  As directed    Increase activity slowly   Complete by:  As directed      Allergies as of 11/18/2018      Reactions   Codeine Nausea And Vomiting      Medication List    TAKE these medications   aspirin 81 MG chewable tablet Chew 1 tablet (81 mg total) by mouth daily. Start taking on:  11/19/2018   clonazePAM 0.5 MG tablet Commonly known as:  KLONOPIN Take 0.5 tablets (0.25 mg total) by mouth daily. What changed:  how much to take   furosemide 20 MG tablet Commonly known as:  LASIX Take 20 mg by mouth daily as needed (for fluid retention).   gabapentin 300 MG capsule Commonly known as:  NEURONTIN Take 1 capsule (300 mg total) by mouth 3 (three) times daily.   glucose blood test  strip Use as instructed tid. E11.65   Insulin Glargine 100 UNIT/ML Solostar Pen Commonly known as:  LANTUS Inject 40 Units into the skin at bedtime.   Insulin Pen Needle 31G X 8 MM Misc 1 each by Does not apply route as directed.   JANUVIA 50 MG  tablet Generic drug:  sitaGLIPtin TAKE 1 TABLET BY MOUTH  DAILY   lisinopril-hydrochlorothiazide 20-12.5 MG tablet Commonly known as:  PRINZIDE,ZESTORETIC Take 1 tablet by mouth daily.   metoprolol succinate 50 MG 24 hr tablet Commonly known as:  TOPROL-XL Take 50 mg by mouth daily. Take with or immediately following a meal.   MYRBETRIQ 50 MG Tb24 tablet Generic drug:  mirabegron ER Take 50 mg by mouth daily.   simvastatin 20 MG tablet Commonly known as:  ZOCOR Take 20 mg by mouth every evening.   tiZANidine 2 MG tablet Commonly known as:  ZANAFLEX Take by mouth every 6 (six) hours as needed for muscle spasms.   Vitamin D3 125 MCG (5000 UT) Caps Take 1 capsule (5,000 Units total) by mouth daily.      Follow-up Information    Sharilyn Sites, MD Follow up in 1 week(s).   Specialty:  Family Medicine Contact information: [address] [place] [address] [phone]          Allergies  Allergen Reactions  . Codeine Nausea And Vomiting    Consultations:  None   Procedures/Studies: Ct Head Wo Contrast  Result Date: 11/17/2018 CLINICAL DATA:  Slurred speech and left facial droop. EXAM: CT HEAD WITHOUT CONTRAST TECHNIQUE: Contiguous axial images were obtained from the base of the skull through the vertex without intravenous contrast. COMPARISON:  None. FINDINGS: Brain: There is no mass, hemorrhage or extra-axial collection. The size and configuration of the ventricles and extra-axial CSF spaces are normal for age. The brain parenchyma is normal, without evidence of acute or chronic infarction. Vascular: Atherosclerotic calcification of the internal carotid arteries at the skull base. No abnormal hyperdensity of the major intracranial arteries or dural venous sinuses. Skull: The visualized skull base, calvarium and extracranial soft tissues are normal. Sinuses/Orbits: No fluid levels or advanced mucosal thickening of the visualized paranasal sinuses. No mastoid  or middle ear effusion. The orbits are normal. IMPRESSION: Normal aging brain. Electronically Signed   By: Ulyses Jarred M.D.   On: 11/17/2018 02:17   Mr Brain Wo Contrast  Result Date: 11/17/2018 CLINICAL DATA:  84 year old female with left facial droop for 2 days, weakness. EXAM: MRI HEAD WITHOUT CONTRAST TECHNIQUE: Multiplanar, multiecho pulse sequences of the brain and surrounding structures were obtained without intravenous contrast. COMPARISON:  Head CT without contrast 0151 hours today. Cervical spine MRI 03/18/2015. FINDINGS: Brain: No restricted diffusion to suggest acute infarction. No midline shift, mass effect, evidence of mass lesion, ventriculomegaly, extra-axial collection or acute intracranial hemorrhage. Cervicomedullary junction and pituitary are within normal limits. No cortical encephalomalacia or chronic cerebral blood products identified. Minimal to mild nonspecific mostly periventricular cerebral white matter T2 and FLAIR hyperintensity. The deep gray matter nuclei, brainstem, and cerebellum are normal for age. Vascular: Major intracranial vascular flow voids are preserved. The left vertebral artery is mildly dominant as seen on the prior MRI. Skull and upper cervical spine: Stable visible upper cervical spine since 2016. Visualized bone marrow signal is within normal limits. Sinuses/Orbits: Postoperative changes to both globes. Otherwise negative orbits. Paranasal Visualized paranasal sinuses and mastoids are stable and well pneumatized. Other: Grossly normal visible internal auditory structures. Normal stylomastoid foramina. Bilateral  parotid glands appear normal. Other face and scalp soft tissues appear negative. IMPRESSION: No acute intracranial abnormality. No explanation for left facial droop and essentially normal for age noncontrast MRI appearance of the brain. Electronically Signed   By: Genevie Ann M.D.   On: 11/17/2018 10:04   US Carotid Bilateral (at [facility] Only)  Result  Date: 11/17/2018 CLINICAL DATA:  84 year old female with a history of slurred speech EXAM: BILATERAL CAROTID DUPLEX ULTRASOUND TECHNIQUE: Pearline Cables scale imaging, color Doppler and duplex ultrasound were performed of bilateral carotid and vertebral arteries in the neck. COMPARISON:  None. FINDINGS: Criteria: Quantification of carotid stenosis is based on velocity parameters that correlate the residual internal carotid diameter with NASCET-based stenosis levels, using the diameter of the distal internal carotid lumen as the denominator for stenosis measurement. The following velocity measurements were obtained: RIGHT ICA:  Systolic 92 cm/sec, Diastolic 14 cm/sec CCA:  89 cm/sec SYSTOLIC ICA/CCA RATIO:  1.0 ECA:  116 cm/sec LEFT ICA:  Systolic 756 cm/sec, Diastolic 20 cm/sec CCA:  433 cm/sec SYSTOLIC ICA/CCA RATIO:  1.1 ECA:  109 cm/sec Right Brachial SBP: Not acquired Left Brachial SBP: Not acquired RIGHT CAROTID ARTERY: No significant calcified disease of the right common carotid artery. Intermediate waveform maintained. Heterogeneous plaque without significant calcifications at the right carotid bifurcation. Low resistance waveform of the right ICA. Mild tortuosity. RIGHT VERTEBRAL ARTERY: Antegrade flow with low resistance waveform. LEFT CAROTID ARTERY: No significant calcified disease of the left common carotid artery. Intermediate waveform maintained. Heterogeneous plaque at the left carotid bifurcation without significant calcifications. Low resistance waveform of the left ICA. Mild tortuosity LEFT VERTEBRAL ARTERY:  Antegrade flow with low resistance waveform. IMPRESSION: Color duplex indicates minimal heterogeneous plaque, with no hemodynamically significant stenosis by duplex criteria in the extracranial cerebrovascular circulation. Signed, Dulcy Fanny. Dellia Nims, RPVI [facility] Electronically Signed   By: Corrie Mckusick D.O.   On: 11/17/2018 10:50   Mr  Jodene Nam Head/brain IR Cm  Result Date: 11/17/2018 CLINICAL DATA:  84 year old female with left facial droop for 2 days, weakness. EXAM: MRA HEAD WITHOUT CONTRAST TECHNIQUE: Angiographic images of the Circle of Willis were obtained using MRA technique without intravenous contrast. COMPARISON:  Brain MRI today reported separately. FINDINGS: Antegrade flow in the posterior circulation with dominant left vertebral artery. Normal left PICA origin. The right AICA appears dominant and is patent. Patent vertebrobasilar junction and basilar artery without stenosis. Normal SCA is and left PCA origin. Fetal type right PCA origin with tortuous right P1. Left Posterior communicating artery is diminutive or absent. Bilateral PCA branches are within normal limits. Antegrade flow in both ICA siphons. Mildly tortuous cervical left ICA. No siphon stenosis. Ophthalmic and right posterior communicating artery origins are normal. Patent carotid termini, MCA and ACA origins. The left A1 is mildly dominant. Anterior communicating artery and visible ACA branches are within normal limits. MCA M1 segments, bifurcations, and visible MCA branches are within normal limits. IMPRESSION: Normal for age intracranial MRA. Electronically Signed   By: Genevie Ann M.D.   On: 11/17/2018 10:07     Discharge Exam: Vitals:   11/18/18 0613 11/18/18 1015  BP: (!) 143/53 (!) 175/74  Pulse: 77 72  Resp: 18 18  Temp: 98.1 F (36.7 C) 98.3 F (36.8 C)  SpO2: 98% 98%   Vitals:   11/17/18 2214 11/18/18 0217 11/18/18 0613 11/18/18 1015  BP: (!) 172/57 (!) 159/86 (!) 143/53 (!) 175/74  Pulse: 72 75 77 72  Resp: 20 16 18 18   Temp: 98.5 F (36.9 C) 98.4 F (36.9 C) 98.1 F (36.7 C) 98.3 F (36.8 C)  TempSrc: Oral Oral Oral Oral  SpO2: 97% 98% 98% 98%  Weight:      Height:        General: Pt is alert, awake, not in acute distress Cardiovascular: RRR, S1/S2 +, no rubs, no gallops Respiratory: CTA bilaterally, no wheezing, no  rhonchi Abdominal: Soft, NT, ND, bowel sounds + Extremities: no edema, no cyanosis    The results of significant diagnostics from this hospitalization (including imaging, microbiology, ancillary and laboratory) are listed below for reference.     Microbiology: No results found for this or any previous visit (from the past 240 hour(s)).   Labs: BNP (last 3 results) No results for input(s): BNP in the last 8760 hours. Basic Metabolic Panel: Recent Labs  Lab 11/17/18 0141 11/17/18 0425 11/18/18 0531  NA 132*  --  141  K 4.2  --  3.3*  CL 95*  --  106  CO2 27  --  27  GLUCOSE 568*  --  210*  BUN 25*  --  21  CREATININE 2.17*  --  1.42*  CALCIUM 9.3  --  9.1  MG  --  1.7  --   PHOS  --  3.9  --    Liver Function Tests: Recent Labs  Lab 11/17/18 0425  AST 19  ALT 19  ALKPHOS 77  BILITOT 1.0  PROT 6.9  ALBUMIN 3.7   No results for input(s): LIPASE, AMYLASE in the last 168 hours. No results for input(s): AMMONIA in the last 168 hours. CBC: Recent Labs  Lab 11/17/18 0141 11/18/18 0531  WBC 9.3 9.3  NEUTROABS 6.3  --   HGB 13.3 13.1  HCT 40.7 40.0  MCV 89.6 89.9  PLT 205 193   Cardiac Enzymes: No results for input(s): CKTOTAL, CKMB, CKMBINDEX, TROPONINI in the last 168 hours. BNP: Invalid input(s): POCBNP CBG: Recent Labs  Lab 11/17/18 2137 11/18/18 0006 11/18/18 0429 11/18/18 0740 11/18/18 1114  GLUCAP 257* 289* 191* 172* 286*   D-Dimer No results for input(s): DDIMER in the last 72 hours. Hgb A1c Recent Labs    11/17/18 0141  HGBA1C 9.0*   Lipid Profile Recent Labs    11/17/18 0141  CHOL 143  HDL 39*  LDLCALC 69  TRIG 174*  CHOLHDL 3.7   Thyroid function studies No results for input(s): TSH, T4TOTAL, T3FREE, THYROIDAB in the last 72 hours.  Invalid input(s): FREET3 Anemia work up No results for input(s): VITAMINB12, FOLATE, FERRITIN, TIBC, IRON, RETICCTPCT in the last 72 hours. Urinalysis    Component Value Date/Time    COLORURINE YELLOW 11/17/2018 2258   APPEARANCEUR CLEAR 11/17/2018 2258   LABSPEC 1.013 11/17/2018 2258   PHURINE 5.0 11/17/2018 2258   GLUCOSEU >=500 (A) 11/17/2018 2258   HGBUR NEGATIVE 11/17/2018 2258   BILIRUBINUR NEGATIVE 11/17/2018 2258   KETONESUR NEGATIVE 11/17/2018 2258   PROTEINUR NEGATIVE 11/17/2018 2258   UROBILINOGEN 0.2 09/06/2009 0135   NITRITE NEGATIVE 11/17/2018 2258   LEUKOCYTESUR NEGATIVE 11/17/2018 2258   Sepsis Labs Invalid input(s): PROCALCITONIN,  WBC,  LACTICIDVEN Microbiology No results found for this or any previous visit (from the past 240 hour(s)).   Time coordinating discharge: 35 minutes  SIGNED:   Rodena Goldmann, DO [facility] 11/18/2018, 2:12 PM Pager [phone]  If 7PM-7AM, please contact night-coverage [email or web address] Password [email or web address]

## 2018-11-25 DIAGNOSIS — I10 Essential (primary) hypertension: Secondary | ICD-10-CM | POA: Diagnosis not present

## 2018-11-25 DIAGNOSIS — E1165 Type 2 diabetes mellitus with hyperglycemia: Secondary | ICD-10-CM | POA: Diagnosis not present

## 2018-11-25 DIAGNOSIS — E1140 Type 2 diabetes mellitus with diabetic neuropathy, unspecified: Secondary | ICD-10-CM | POA: Diagnosis not present

## 2018-11-25 DIAGNOSIS — R2689 Other abnormalities of gait and mobility: Secondary | ICD-10-CM | POA: Diagnosis not present

## 2018-11-25 DIAGNOSIS — Z79899 Other long term (current) drug therapy: Secondary | ICD-10-CM | POA: Diagnosis not present

## 2018-11-25 DIAGNOSIS — Z9181 History of falling: Secondary | ICD-10-CM | POA: Diagnosis not present

## 2018-11-25 DIAGNOSIS — Z7982 Long term (current) use of aspirin: Secondary | ICD-10-CM | POA: Diagnosis not present

## 2018-11-25 DIAGNOSIS — Z794 Long term (current) use of insulin: Secondary | ICD-10-CM | POA: Diagnosis not present

## 2018-11-28 DIAGNOSIS — Z794 Long term (current) use of insulin: Secondary | ICD-10-CM | POA: Diagnosis not present

## 2018-11-28 DIAGNOSIS — E1165 Type 2 diabetes mellitus with hyperglycemia: Secondary | ICD-10-CM | POA: Diagnosis not present

## 2018-11-28 DIAGNOSIS — I10 Essential (primary) hypertension: Secondary | ICD-10-CM | POA: Diagnosis not present

## 2018-11-28 DIAGNOSIS — E1140 Type 2 diabetes mellitus with diabetic neuropathy, unspecified: Secondary | ICD-10-CM | POA: Diagnosis not present

## 2018-11-28 DIAGNOSIS — Z9181 History of falling: Secondary | ICD-10-CM | POA: Diagnosis not present

## 2018-11-28 DIAGNOSIS — R2689 Other abnormalities of gait and mobility: Secondary | ICD-10-CM | POA: Diagnosis not present

## 2018-11-29 ENCOUNTER — Observation Stay (HOSPITAL_COMMUNITY)
Admission: EM | Admit: 2018-11-29 | Discharge: 2018-11-30 | Disposition: A | Discharge status: Home / Self Care | Payer: Medicare Other | Attending: Internal Medicine | Admitting: Internal Medicine

## 2018-11-29 ENCOUNTER — Other Ambulatory Visit: Payer: Self-pay

## 2018-11-29 ENCOUNTER — Encounter (HOSPITAL_COMMUNITY): Payer: Self-pay | Admitting: Emergency Medicine

## 2018-11-29 ENCOUNTER — Emergency Department (HOSPITAL_COMMUNITY): Payer: Medicare Other

## 2018-11-29 DIAGNOSIS — E1165 Type 2 diabetes mellitus with hyperglycemia: Principal | ICD-10-CM | POA: Diagnosis present

## 2018-11-29 DIAGNOSIS — E6609 Other obesity due to excess calories: Secondary | ICD-10-CM | POA: Insufficient documentation

## 2018-11-29 DIAGNOSIS — I129 Hypertensive chronic kidney disease with stage 1 through stage 4 chronic kidney disease, or unspecified chronic kidney disease: Secondary | ICD-10-CM | POA: Insufficient documentation

## 2018-11-29 DIAGNOSIS — B372 Candidiasis of skin and nail: Secondary | ICD-10-CM | POA: Insufficient documentation

## 2018-11-29 DIAGNOSIS — R739 Hyperglycemia, unspecified: Secondary | ICD-10-CM

## 2018-11-29 DIAGNOSIS — N183 Chronic kidney disease, stage 3 (moderate): Secondary | ICD-10-CM | POA: Insufficient documentation

## 2018-11-29 DIAGNOSIS — I10 Essential (primary) hypertension: Secondary | ICD-10-CM | POA: Diagnosis not present

## 2018-11-29 DIAGNOSIS — Z6831 Body mass index (BMI) 31.0-31.9, adult: Secondary | ICD-10-CM

## 2018-11-29 DIAGNOSIS — E66811 Obesity, class 1: Secondary | ICD-10-CM

## 2018-11-29 DIAGNOSIS — E785 Hyperlipidemia, unspecified: Secondary | ICD-10-CM | POA: Diagnosis not present

## 2018-11-29 DIAGNOSIS — R05 Cough: Secondary | ICD-10-CM | POA: Diagnosis not present

## 2018-11-29 DIAGNOSIS — E782 Mixed hyperlipidemia: Secondary | ICD-10-CM

## 2018-11-29 LAB — CBG MONITORING, ED
Glucose-Capillary: 423 mg/dL — ABNORMAL HIGH (ref 70–99)
Glucose-Capillary: 314 mg/dL — ABNORMAL HIGH (ref 70–99)
Glucose-Capillary: 314 mg/dL — ABNORMAL HIGH (ref 70–99)
Glucose-Capillary: 351 mg/dL — ABNORMAL HIGH (ref 70–99)

## 2018-11-29 LAB — COMPREHENSIVE METABOLIC PANEL
ALT: 23 U/L (ref 0–44)
AST: 21 U/L (ref 15–41)
Albumin: 3.9 g/dL (ref 3.5–5.0)
Alkaline Phosphatase: 85 U/L (ref 38–126)
Anion gap: 10 (ref 5–15)
BUN: 18 mg/dL (ref 8–23)
CO2: 27 mmol/L (ref 22–32)
Calcium: 10.2 mg/dL (ref 8.9–10.3)
Chloride: 99 mmol/L (ref 98–111)
Creatinine, Ser: 1.26 mg/dL — ABNORMAL HIGH (ref 0.44–1.00)
GFR calc Af Amer: 44 mL/min — ABNORMAL LOW (ref >60)
GFR calc non Af Amer: 38 mL/min — ABNORMAL LOW (ref >60)
Glucose, Bld: 460 mg/dL — ABNORMAL HIGH (ref 70–99)
Potassium: 4 mmol/L (ref 3.5–5.1)
Sodium: 136 mmol/L (ref 135–145)
Total Bilirubin: 0.9 mg/dL (ref 0.3–1.2)
Total Protein: 7.1 g/dL (ref 6.5–8.1)

## 2018-11-29 LAB — CBC WITH DIFFERENTIAL/PLATELET
Abs Immature Granulocytes: 0.03 10*3/uL (ref 0.00–0.07)
Basophils Absolute: 0.1 10*3/uL (ref 0.0–0.1)
Basophils Relative: 1 %
Eosinophils Absolute: 0.3 10*3/uL (ref 0.0–0.5)
Eosinophils Relative: 4 %
HCT: 43.8 % (ref 36.0–46.0)
Hemoglobin: 14.2 g/dL (ref 12.0–15.0)
Immature Granulocytes: 0 %
Lymphocytes Relative: 19 %
Lymphs Abs: 1.5 10*3/uL (ref 0.7–4.0)
MCH: 29.3 pg (ref 26.0–34.0)
MCHC: 32.4 g/dL (ref 30.0–36.0)
MCV: 90.3 fL (ref 80.0–100.0)
Monocytes Absolute: 0.8 10*3/uL (ref 0.1–1.0)
Monocytes Relative: 9 %
Neutro Abs: 5.3 10*3/uL (ref 1.7–7.7)
Neutrophils Relative %: 67 %
Platelets: 197 10*3/uL (ref 150–400)
RBC: 4.85 MIL/uL (ref 3.87–5.11)
RDW: 13 % (ref 11.5–15.5)
WBC: 8 10*3/uL (ref 4.0–10.5)
nRBC: 0 % (ref 0.0–0.2)

## 2018-11-29 LAB — GLUCOSE, CAPILLARY: Glucose-Capillary: 338 mg/dL — ABNORMAL HIGH (ref 70–99)

## 2018-11-29 MED ORDER — ENOXAPARIN SODIUM 30 MG/0.3ML ~~LOC~~ SOLN
30.0000 mg | SUBCUTANEOUS | Status: DC
  Administered 2018-11-29: 30 mg via SUBCUTANEOUS
  Filled 2018-11-29: qty 0.3

## 2018-11-29 MED ORDER — SODIUM CHLORIDE 0.9 % IV SOLN
INTRAVENOUS | Status: DC
  Administered 2018-11-29: 20:00:00 via INTRAVENOUS

## 2018-11-29 MED ORDER — GABAPENTIN 300 MG PO CAPS
600.0000 mg | ORAL_CAPSULE | Freq: Every day | ORAL | Status: DC
  Administered 2018-11-29: 600 mg via ORAL
  Filled 2018-11-29: qty 2

## 2018-11-29 MED ORDER — INSULIN ASPART 100 UNIT/ML ~~LOC~~ SOLN
10.0000 [IU] | Freq: Once | SUBCUTANEOUS | Status: AC
  Administered 2018-11-29: 10 [IU] via INTRAVENOUS
  Filled 2018-11-29: qty 1

## 2018-11-29 MED ORDER — INSULIN ASPART 100 UNIT/ML ~~LOC~~ SOLN
0.0000 [IU] | Freq: Three times a day (TID) | SUBCUTANEOUS | Status: DC
  Administered 2018-11-30: 5 [IU] via SUBCUTANEOUS
  Administered 2018-11-30: 8 [IU] via SUBCUTANEOUS

## 2018-11-29 MED ORDER — HYDROCHLOROTHIAZIDE 12.5 MG PO CAPS
12.5000 mg | ORAL_CAPSULE | Freq: Two times a day (BID) | ORAL | Status: DC
  Administered 2018-11-29 – 2018-11-30 (×2): 12.5 mg via ORAL
  Filled 2018-11-29 (×6): qty 1

## 2018-11-29 MED ORDER — ACETAMINOPHEN 325 MG PO TABS: 650.0000 mg | ORAL_TABLET | Freq: Four times a day (QID) | ORAL | Status: DC | PRN

## 2018-11-29 MED ORDER — LINAGLIPTIN 5 MG PO TABS
5.0000 mg | ORAL_TABLET | Freq: Every day | ORAL | Status: DC
  Administered 2018-11-29: 5 mg via ORAL
  Filled 2018-11-29 (×3): qty 1

## 2018-11-29 MED ORDER — ONDANSETRON HCL 4 MG PO TABS: 4.0000 mg | ORAL_TABLET | Freq: Four times a day (QID) | ORAL | Status: DC | PRN

## 2018-11-29 MED ORDER — TIZANIDINE HCL 2 MG PO TABS
2.0000 mg | ORAL_TABLET | Freq: Four times a day (QID) | ORAL | Status: DC | PRN
  Filled 2018-11-29: qty 1

## 2018-11-29 MED ORDER — VITAMIN D 25 MCG (1000 UNIT) PO TABS
5000.0000 [IU] | ORAL_TABLET | Freq: Every evening | ORAL | Status: DC
  Filled 2018-11-29: qty 5

## 2018-11-29 MED ORDER — SODIUM CHLORIDE 0.9 % IV BOLUS
500.0000 mL | Freq: Once | INTRAVENOUS | Status: AC
  Administered 2018-11-29: 500 mL via INTRAVENOUS

## 2018-11-29 MED ORDER — LISINOPRIL 10 MG PO TABS
20.0000 mg | ORAL_TABLET | Freq: Two times a day (BID) | ORAL | Status: DC
  Administered 2018-11-29 – 2018-11-30 (×2): 20 mg via ORAL
  Filled 2018-11-29 (×2): qty 2

## 2018-11-29 MED ORDER — INSULIN GLARGINE 100 UNIT/ML ~~LOC~~ SOLN
40.0000 [IU] | Freq: Every day | SUBCUTANEOUS | Status: DC
  Administered 2018-11-29: 40 [IU] via SUBCUTANEOUS
  Filled 2018-11-29 (×2): qty 0.4

## 2018-11-29 MED ORDER — ACETAMINOPHEN 650 MG RE SUPP: 650.0000 mg | Freq: Four times a day (QID) | RECTAL | Status: DC | PRN

## 2018-11-29 MED ORDER — CLONAZEPAM 0.5 MG PO TABS
0.5000 mg | ORAL_TABLET | Freq: Every evening | ORAL | Status: DC
  Administered 2018-11-29: 0.5 mg via ORAL
  Filled 2018-11-29: qty 1

## 2018-11-29 MED ORDER — LISINOPRIL-HYDROCHLOROTHIAZIDE 20-12.5 MG PO TABS: 1.0000 | ORAL_TABLET | Freq: Two times a day (BID) | ORAL | Status: DC

## 2018-11-29 MED ORDER — SIMVASTATIN 20 MG PO TABS
20.0000 mg | ORAL_TABLET | Freq: Every evening | ORAL | Status: DC
  Administered 2018-11-29: 20 mg via ORAL
  Filled 2018-11-29: qty 2

## 2018-11-29 MED ORDER — METOPROLOL SUCCINATE ER 50 MG PO TB24
50.0000 mg | ORAL_TABLET | Freq: Every day | ORAL | Status: DC
  Administered 2018-11-29: 50 mg via ORAL
  Filled 2018-11-29: qty 2

## 2018-11-29 MED ORDER — ONDANSETRON HCL 4 MG/2ML IJ SOLN: 4.0000 mg | Freq: Four times a day (QID) | INTRAMUSCULAR | Status: DC | PRN

## 2018-11-29 MED ORDER — MIRABEGRON ER 25 MG PO TB24
50.0000 mg | ORAL_TABLET | Freq: Every evening | ORAL | Status: DC
  Filled 2018-11-29 (×2): qty 1

## 2018-11-29 MED ORDER — ASPIRIN 81 MG PO CHEW
81.0000 mg | CHEWABLE_TABLET | Freq: Every evening | ORAL | Status: DC
  Administered 2018-11-29: 81 mg via ORAL
  Filled 2018-11-29: qty 1

## 2018-11-29 NOTE — ED Notes (Signed)
To Rad 

## 2018-11-29 NOTE — ED Notes (Signed)
From Rad 

## 2018-11-29 NOTE — ED Notes (Signed)
Pt reports high BS   She has very poor vascular selection   Denies Urinary Sx

## 2018-11-29 NOTE — ED Triage Notes (Signed)
Patient states checked blood sugar this morning before breakfast, 481.Per patient normal runs 150. Patient is has diabetes type 2 in which she uses insulin. Patient denies taking any new medications, does state recently had a cough. Denies any fevers, nausea, vomiting, or diarrhea. Per patient thirst and frequent urination.

## 2018-11-29 NOTE — H&P (Signed)
History and Physical    [name] BJY:[id number] DOB: 03/30/1929 DOA: 11/29/2018  PCP: Sharilyn Sites, MD  Patient coming from: Home  I have personally briefly reviewed patient's old medical records in [place]  Chief Complaint: Hyperglycemia  HPI: [name] is a 84 y.o. female with medical history significant of DM2, HTN, TIA ~2 weeks ago.  Patient presents to the ED with c/o hyperglycemia and uncontrolled BGLs for the past 4 days.  She says her BGL usually runs 130 when she checks it 2x a day but over past couple of days BGLs running up in the 400s.  Today BGL before breakfast was 481.  No recent med changes, she takes Januvia and 40u Lantus QHS.  Did get refill of Lantus pen this past week but states pen looks the same as it always does, she noticed no differences in appearance to suggest that she might have been given the wrong med or anything.  No current illness or symptoms: no fever, chills, N/V/D, dysuria.  No foot ulcers.  No cough.  No abd pain.  No recurrence of the slurred speech / facial droop symptoms she had x2 weeks ago which prompted the TIA admission.   ED Course: BGL 460, down to 314 after 10u novolog.  EDP requests overnight obs admission for hyperglycemia.   Review of Systems: As per HPI otherwise 10 point review of systems negative.   Past Medical History:  Diagnosis Date  . Diabetes mellitus without complication ([place])   . Diabetic neuropathy ([place])   . Hypertension   . Urinary incontinence     Past Surgical History:  Procedure Laterality Date  . ABDOMINAL HYSTERECTOMY    . APPENDECTOMY    . BREAST SURGERY     begnin tumor removed  . CATARACT EXTRACTION, BILATERAL       reports that she has never smoked. She has never used smokeless tobacco. She reports that she does not drink alcohol or use drugs.  Allergies  Allergen Reactions  . Codeine Nausea And Vomiting    Family History  Problem Relation Age of Onset  . CAD  Mother   . Hypertension Mother   . Stroke Father   . Heart attack Brother   . Cancer Brother      Prior to Admission medications   Medication Sig Start Date End Date Taking? Authorizing Provider  aspirin 81 MG chewable tablet Chew 1 tablet (81 mg total) by mouth daily. Patient taking differently: Chew 81 mg by mouth every evening.  11/19/18 12/19/18 Yes Shah, Pratik D, DO  Cholecalciferol (VITAMIN D3) 5000 units CAPS Take 1 capsule (5,000 Units total) by mouth daily. Patient taking differently: Take 5,000 Units by mouth every evening.  04/15/17  Yes Nida, Marella Chimes, MD  clonazePAM (KLONOPIN) 0.5 MG tablet Take 0.5 tablets (0.25 mg total) by mouth daily. Patient taking differently: Take 0.5 mg by mouth every evening.  10/07/18  Yes Tat, Eustace Quail, DO  Cyanocobalamin (B-12 PO) Take 1 tablet by mouth every evening.   Yes [provider]  furosemide (LASIX) 20 MG tablet Take 20 mg by mouth daily as needed (for fluid retention).    Yes [provider]  gabapentin (NEURONTIN) 300 MG capsule Take 1 capsule (300 mg total) by mouth 3 (three) times daily. Patient taking differently: Take 600 mg by mouth at bedtime.  07/11/18  Yes Nida, Marella Chimes, MD  Insulin Glargine (LANTUS SOLOSTAR) 100 UNIT/ML Solostar Pen Inject 40 Units into the skin  at bedtime. 10/02/18  Yes Nida, Marella Chimes, MD  JANUVIA 50 MG tablet TAKE 1 TABLET BY MOUTH  DAILY Patient taking differently: Take 50 mg by mouth every evening.  05/01/18  Yes Nida, Marella Chimes, MD  lisinopril-hydrochlorothiazide (PRINZIDE,ZESTORETIC) 20-12.5 MG tablet Take 1 tablet by mouth 2 (two) times daily.  10/07/18  Yes [provider]  metoprolol succinate (TOPROL-XL) 50 MG 24 hr tablet Take 50 mg by mouth at bedtime. Take with or immediately following a meal.    Yes [provider]  MYRBETRIQ 50 MG TB24 tablet Take 50 mg by mouth every evening.  09/02/14  Yes [provider]  simvastatin (ZOCOR)  20 MG tablet Take 20 mg by mouth every evening.   Yes [provider]  tiZANidine (ZANAFLEX) 2 MG tablet Take by mouth every 6 (six) hours as needed for muscle spasms.   Yes [provider]  glucose blood (ONETOUCH VERIO) test strip Use as instructed tid. E11.65 10/02/18   Cassandria Anger, MD  Insulin Pen Needle (B-D ULTRAFINE III SHORT PEN) 31G X 8 MM MISC 1 each by Does not apply route as directed. 02/26/18   Cassandria Anger, MD    Physical Exam: Vitals:   11/29/18 1800 11/29/18 1900 11/29/18 2000 11/29/18 2100  BP: (!) 139/58 (!) 150/58 135/70 (!) 159/60  Pulse: (!) 58 (!) 55 (!) 58 62  Resp: 18 18 18 18   Temp:      TempSrc:      SpO2: 98% 98% 98% 97%  Weight:      Height:        Constitutional: NAD, calm, comfortable Eyes: PERRL, lids and conjunctivae normal ENMT: Mucous membranes are moist. Posterior pharynx clear of any exudate or lesions.Normal dentition.  Neck: normal, supple, no masses, no thyromegaly Respiratory: clear to auscultation bilaterally, no wheezing, no crackles. Normal respiratory effort. No accessory muscle use.  Cardiovascular: Regular rate and rhythm, no murmurs / rubs / gallops. No extremity edema. 2+ pedal pulses. No carotid bruits.  Abdomen: no tenderness, no masses palpated. No hepatosplenomegaly. Bowel sounds positive.  Musculoskeletal: no clubbing / cyanosis. No joint deformity upper and lower extremities. Good ROM, no contractures. Normal muscle tone.  Skin: no rashes, lesions, ulcers. No induration Neurologic: CN 2-12 grossly intact. Sensation intact, DTR normal. Strength 5/5 in all 4.  Psychiatric: Normal judgment and insight. Alert and oriented x 3. Normal mood.    Labs on Admission: I have personally reviewed following labs and imaging studies  CBC: Recent Labs  Lab 11/29/18 1722  WBC 8.0  NEUTROABS 5.3  HGB 14.2  HCT 43.8  MCV 90.3  PLT 119   Basic Metabolic Panel: Recent Labs  Lab 11/29/18 1722  NA 136    K 4.0  CL 99  CO2 27  GLUCOSE 460*  BUN 18  CREATININE 1.26*  CALCIUM 10.2   GFR: Estimated Creatinine Clearance: 37.5 mL/min (A) (by C-G formula based on SCr of 1.26 mg/dL (H)). Liver Function Tests: Recent Labs  Lab 11/29/18 1722  AST 21  ALT 23  ALKPHOS 85  BILITOT 0.9  PROT 7.1  ALBUMIN 3.9   No results for input(s): LIPASE, AMYLASE in the last 168 hours. No results for input(s): AMMONIA in the last 168 hours. Coagulation Profile: No results for input(s): INR, PROTIME in the last 168 hours. Cardiac Enzymes: No results for input(s): CKTOTAL, CKMB, CKMBINDEX, TROPONINI in the last 168 hours. BNP (last 3 results) No results for input(s): PROBNP in the last  8760 hours. HbA1C: No results for input(s): HGBA1C in the last 72 hours. CBG: Recent Labs  Lab 11/29/18 1620 11/29/18 1919 11/29/18 2037  GLUCAP 423* 314* 314*   Lipid Profile: No results for input(s): CHOL, HDL, LDLCALC, TRIG, CHOLHDL, LDLDIRECT in the last 72 hours. Thyroid Function Tests: No results for input(s): TSH, T4TOTAL, FREET4, T3FREE, THYROIDAB in the last 72 hours. Anemia Panel: No results for input(s): VITAMINB12, FOLATE, FERRITIN, TIBC, IRON, RETICCTPCT in the last 72 hours. Urine analysis:    Component Value Date/Time   COLORURINE YELLOW 11/17/2018 2258   APPEARANCEUR CLEAR 11/17/2018 2258   LABSPEC 1.013 11/17/2018 2258   PHURINE 5.0 11/17/2018 2258   GLUCOSEU >=500 (A) 11/17/2018 2258   HGBUR NEGATIVE 11/17/2018 2258   BILIRUBINUR NEGATIVE 11/17/2018 2258   KETONESUR NEGATIVE 11/17/2018 2258   PROTEINUR NEGATIVE 11/17/2018 2258   UROBILINOGEN 0.2 09/06/2009 0135   NITRITE NEGATIVE 11/17/2018 2258   LEUKOCYTESUR NEGATIVE 11/17/2018 2258    Radiological Exams on Admission: Dg Chest 2 View  Result Date: 11/29/2018 CLINICAL DATA:  Cough. EXAM: CHEST - 2 VIEW COMPARISON:  08/15/2009. FINDINGS: Low lung volumes. The lungs are clear without focal pneumonia, edema, pneumothorax or pleural  effusion. Cardiopericardial silhouette is at upper limits of normal for size. The visualized bony structures of the thorax are intact. IMPRESSION: No active cardiopulmonary disease. Electronically Signed   By: Misty Stanley M.D.   On: 11/29/2018 17:16    EKG: Independently reviewed.  Assessment/Plan Principal Problem:   Uncontrolled type 2 diabetes mellitus with hyperglycemia (HCC) Active Problems:   Essential hypertension, benign    1. Hyperglycemia in setting of uncontrolled DM2 - 1. At [facility] request will admit for overnight OBS 2. Continue home doses of Januvia and Lantus 40u QHS this evening. 3. Add SSI mod scale TID AC 4. CBG checks AC/HS and 3am 5. DM coordinator consult 6. Looks like she sees an endocrinologist as outpt already for f/u 7. UA pending, but no symptoms to suggest UTI or other acute illness to be causing her hyperglycemia. 2. HTN - continue home BP meds 3. CKD stage 3 due to DM - baseline and stable kidney function with creat 1.26 today.  DVT prophylaxis: Lovenox Code Status: Full Family Communication: Family at bedside Disposition Plan: Home after admit Consults called: None Admission status: Place in [place] [place] [facility] Pager [phone] Only works nights!  If 7AM-7PM, please contact the primary day team physician taking care of patient  [email or web address] Password [facility]  11/29/2018, 9:43 PM

## 2018-11-29 NOTE — ED Provider Notes (Signed)
[facility] EMERGENCY DEPARTMENT Provider Note   CSN: [id number] Arrival date & time: 11/29/18  1538     History   Chief Complaint Chief Complaint  Patient presents with  . Hyperglycemia    HPI [name] is a 84 y.o. female.  Patient presenting with elevated blood sugars in the 400 range for the past 3 to 4 days.  Patient has not had any change in her insulin pen or her Januvia.  She has been keeping it refrigerated.  The current one she has is relatively new she has had that for over a week.  She states that she is following her diet.  She is not sure why it has been elevated she has no evidence of an upper respiratory infection or cough or flulike illness.  Or any abdominal pain or dysuria.  Or any skin infections.     Past Medical History:  Diagnosis Date  . Diabetes mellitus without complication ([place])   . Diabetic neuropathy ([place])   . Hypertension   . Urinary incontinence     Patient Active Problem List   Diagnosis Date Noted  . Slurred speech 11/17/2018  . Diabetic neuropathy ([place]) 11/17/2018  . AKI (acute kidney injury) ([place]) 11/17/2018  . Uncontrolled type 2 diabetes mellitus with hyperglycemia ([place]) 06/11/2018  . Mixed hyperlipidemia 02/20/2016  . Essential hypertension, benign 10/10/2015  . Vitamin D deficiency 10/10/2015  . Class 1 obesity due to excess calories with serious comorbidity and body mass index (BMI) of 30.0 to 30.9 in adult 10/10/2015  . HIP PAIN 01/12/2009  . SPINAL STENOSIS 01/12/2009  . LOW BACK PAIN 01/12/2009  . SPONDYLOLYSIS 01/12/2009  . SHOULDER PAIN 10/04/2008  . Uncontrolled type 2 diabetes mellitus with complication, without long-term current use of insulin ([place]) 10/01/2008    Past Surgical History:  Procedure Laterality Date  . ABDOMINAL HYSTERECTOMY    . APPENDECTOMY    . BREAST SURGERY     begnin tumor removed  . CATARACT EXTRACTION, BILATERAL       OB History   None      Home Medications    Prior to  Admission medications   Medication Sig Start Date End Date Taking? Authorizing Provider  aspirin 81 MG chewable tablet Chew 1 tablet (81 mg total) by mouth daily. Patient taking differently: Chew 81 mg by mouth every evening.  11/19/18 12/19/18 Yes Shah, Pratik D, DO  Cholecalciferol (VITAMIN D3) 5000 units CAPS Take 1 capsule (5,000 Units total) by mouth daily. Patient taking differently: Take 5,000 Units by mouth every evening.  04/15/17  Yes Nida, Marella Chimes, MD  clonazePAM (KLONOPIN) 0.5 MG tablet Take 0.5 tablets (0.25 mg total) by mouth daily. Patient taking differently: Take 0.5 mg by mouth every evening.  10/07/18  Yes Tat, Eustace Quail, DO  Cyanocobalamin (B-12 PO) Take 1 tablet by mouth every evening.   Yes [provider]  furosemide (LASIX) 20 MG tablet Take 20 mg by mouth daily as needed (for fluid retention).    Yes [provider]  gabapentin (NEURONTIN) 300 MG capsule Take 1 capsule (300 mg total) by mouth 3 (three) times daily. Patient taking differently: Take 600 mg by mouth at bedtime.  07/11/18  Yes Nida, Marella Chimes, MD  Insulin Glargine (LANTUS SOLOSTAR) 100 UNIT/ML Solostar Pen Inject 40 Units into the skin at bedtime. 10/02/18  Yes Nida, Marella Chimes, MD  JANUVIA 50 MG tablet TAKE 1 TABLET BY MOUTH  DAILY Patient taking differently: Take 50 mg by  mouth every evening.  05/01/18  Yes Nida, Marella Chimes, MD  lisinopril-hydrochlorothiazide (PRINZIDE,ZESTORETIC) 20-12.5 MG tablet Take 1 tablet by mouth 2 (two) times daily.  10/07/18  Yes [provider]  metoprolol succinate (TOPROL-XL) 50 MG 24 hr tablet Take 50 mg by mouth at bedtime. Take with or immediately following a meal.    Yes [provider]  MYRBETRIQ 50 MG TB24 tablet Take 50 mg by mouth every evening.  09/02/14  Yes [provider]  simvastatin (ZOCOR) 20 MG tablet Take 20 mg by mouth every evening.   Yes [provider]  tiZANidine (ZANAFLEX) 2 MG tablet  Take by mouth every 6 (six) hours as needed for muscle spasms.   Yes [provider]  glucose blood (ONETOUCH VERIO) test strip Use as instructed tid. E11.65 10/02/18   Cassandria Anger, MD  Insulin Pen Needle (B-D ULTRAFINE III SHORT PEN) 31G X 8 MM MISC 1 each by Does not apply route as directed. 02/26/18   Cassandria Anger, MD    Family History Family History  Problem Relation Age of Onset  . CAD Mother   . Hypertension Mother   . Stroke Father   . Heart attack Brother   . Cancer Brother     Social History Social History   Tobacco Use  . Smoking status: Never Smoker  . Smokeless tobacco: Never Used  Substance Use Topics  . Alcohol use: No  . Drug use: No     Allergies   Codeine   Review of Systems Review of Systems  Constitutional: Negative for fever.  HENT: Negative for congestion.   Eyes: Negative for redness and visual disturbance.  Respiratory: Negative for shortness of breath.   Cardiovascular: Negative for chest pain.  Gastrointestinal: Negative for abdominal pain, diarrhea, nausea and vomiting.  Genitourinary: Negative for dysuria.  Musculoskeletal: Negative for myalgias.  Skin: Negative for rash and wound.  Neurological: Negative for syncope and headaches.  Hematological: Does not bruise/bleed easily.  Psychiatric/Behavioral: Negative for confusion.     Physical Exam Updated Vital Signs BP 135/70   Pulse (!) 58   Temp 98.1 F (36.7 C) (Oral)   Resp 18   Ht 1.727 m (5\' 8" )   Wt 93 kg   SpO2 98%   BMI 31.17 kg/m   Physical Exam  Constitutional: She is oriented to person, place, and time. She appears well-developed and well-nourished. No distress.  HENT:  Head: Normocephalic and atraumatic.  Mouth/Throat: Oropharynx is clear and moist.  Eyes: Pupils are equal, round, and reactive to light. EOM are normal.  Neck: Neck supple.  Cardiovascular: Normal rate, regular rhythm and normal heart sounds.  Pulmonary/Chest: Effort  normal and breath sounds normal. No respiratory distress.  Abdominal: Soft. Bowel sounds are normal. There is no tenderness.  Musculoskeletal: Normal range of motion.  Neurological: She is alert and oriented to person, place, and time. No cranial nerve deficit or sensory deficit. She exhibits normal muscle tone. Coordination normal.  Skin: Skin is warm. No rash noted.  Nursing note and vitals reviewed.    ED Treatments / Results  Labs (all labs ordered are listed, but only abnormal results are displayed) Labs Reviewed  COMPREHENSIVE METABOLIC PANEL - Abnormal; Notable for the following components:      Result Value   Glucose, Bld 460 (*)    Creatinine, Ser 1.26 (*)    GFR calc non Af Amer 38 (*)    GFR calc Af Amer 44 (*)  All other components within normal limits  CBG MONITORING, ED - Abnormal; Notable for the following components:   Glucose-Capillary 423 (*)    All other components within normal limits  CBG MONITORING, ED - Abnormal; Notable for the following components:   Glucose-Capillary 314 (*)    All other components within normal limits  CBG MONITORING, ED - Abnormal; Notable for the following components:   Glucose-Capillary 314 (*)    All other components within normal limits  CBC WITH DIFFERENTIAL/PLATELET  URINALYSIS, ROUTINE W REFLEX MICROSCOPIC    EKG None  Radiology Dg Chest 2 View  Result Date: 11/29/2018 CLINICAL DATA:  Cough. EXAM: CHEST - 2 VIEW COMPARISON:  08/15/2009. FINDINGS: Low lung volumes. The lungs are clear without focal pneumonia, edema, pneumothorax or pleural effusion. Cardiopericardial silhouette is at upper limits of normal for size. The visualized bony structures of the thorax are intact. IMPRESSION: No active cardiopulmonary disease. Electronically Signed   By: Misty Stanley M.D.   On: 11/29/2018 17:16    Procedures Procedures (including critical care time)  Medications Ordered in ED Medications  0.9 %  sodium chloride infusion (  Intravenous New Bag/Given 11/29/18 2009)  sodium chloride 0.9 % bolus 500 mL (0 mLs Intravenous Stopped 11/29/18 1944)  insulin aspart (novoLOG) injection 10 Units (10 Units Intravenous Given 11/29/18 1740)  insulin aspart (novoLOG) injection 10 Units (10 Units Intravenous Given 11/29/18 1949)     Initial Impression / Assessment and Plan / ED Course  I have reviewed the triage vital signs and the nursing notes.  Pertinent labs & imaging results that were available during my care of the patient were reviewed by me and considered in my medical decision making (see chart for details).    Work-up without any signs of infection.  Patient's blood sugars remained in the 300s despite having 10 units of insulin IV x2.  No signs of any metabolic acidosis.  Patient nontoxic.  Not clear why there is sudden change over the last 4 days with an increased insulin need.  Patient also only had a diabetic snack here and blood sugars still remained in the 300s.  And actually went up some.  Discussed with hospitalist they will admit observation overnight follow insulin requirements and try to help adjust her diabetic medications and insulin.  Patient nontoxic no acute distress.   Final Clinical Impressions(s) / ED Diagnoses   Final diagnoses:  Hyperglycemia    ED Discharge Orders    None       Fredia Sorrow, MD 11/30/18 [phone]

## 2018-11-29 NOTE — ED Notes (Signed)
Patient given snacks and diet soda.

## 2018-11-30 DIAGNOSIS — N183 Chronic kidney disease, stage 3 (moderate): Secondary | ICD-10-CM | POA: Diagnosis not present

## 2018-11-30 DIAGNOSIS — E1165 Type 2 diabetes mellitus with hyperglycemia: Secondary | ICD-10-CM | POA: Diagnosis not present

## 2018-11-30 DIAGNOSIS — Z794 Long term (current) use of insulin: Secondary | ICD-10-CM

## 2018-11-30 DIAGNOSIS — Z6831 Body mass index (BMI) 31.0-31.9, adult: Secondary | ICD-10-CM

## 2018-11-30 DIAGNOSIS — E785 Hyperlipidemia, unspecified: Secondary | ICD-10-CM

## 2018-11-30 DIAGNOSIS — E1122 Type 2 diabetes mellitus with diabetic chronic kidney disease: Secondary | ICD-10-CM | POA: Diagnosis not present

## 2018-11-30 DIAGNOSIS — E6609 Other obesity due to excess calories: Secondary | ICD-10-CM | POA: Diagnosis not present

## 2018-11-30 DIAGNOSIS — I10 Essential (primary) hypertension: Secondary | ICD-10-CM | POA: Diagnosis not present

## 2018-11-30 LAB — URINALYSIS, ROUTINE W REFLEX MICROSCOPIC
Bacteria, UA: NONE SEEN
Bilirubin Urine: NEGATIVE
Glucose, UA: >=500 mg/dL — AB
Hgb urine dipstick: NEGATIVE
Ketones, ur: NEGATIVE mg/dL
Leukocytes, UA: NEGATIVE
Nitrite: NEGATIVE
Protein, ur: NEGATIVE mg/dL
Specific Gravity, Urine: 1.018 (ref 1.005–1.030)
pH: 5 (ref 5.0–8.0)

## 2018-11-30 LAB — GLUCOSE, CAPILLARY
Glucose-Capillary: 252 mg/dL — ABNORMAL HIGH (ref 70–99)
Glucose-Capillary: 215 mg/dL — ABNORMAL HIGH (ref 70–99)
Glucose-Capillary: 284 mg/dL — ABNORMAL HIGH (ref 70–99)

## 2018-11-30 LAB — BASIC METABOLIC PANEL
Anion gap: 7 (ref 5–15)
BUN: 14 mg/dL (ref 8–23)
CO2: 27 mmol/L (ref 22–32)
Calcium: 9.2 mg/dL (ref 8.9–10.3)
Chloride: 104 mmol/L (ref 98–111)
Creatinine, Ser: 1.12 mg/dL — ABNORMAL HIGH (ref 0.44–1.00)
GFR calc Af Amer: 51 mL/min — ABNORMAL LOW (ref >60)
GFR calc non Af Amer: 44 mL/min — ABNORMAL LOW (ref >60)
Glucose, Bld: 249 mg/dL — ABNORMAL HIGH (ref 70–99)
Potassium: 3.1 mmol/L — ABNORMAL LOW (ref 3.5–5.1)
Sodium: 138 mmol/L (ref 135–145)

## 2018-11-30 MED ORDER — ENOXAPARIN SODIUM 40 MG/0.4ML ~~LOC~~ SOLN: 40.0000 mg | SUBCUTANEOUS | Status: DC

## 2018-11-30 MED ORDER — INSULIN GLARGINE 100 UNIT/ML SOLOSTAR PEN: 23.0000 [IU] | PEN_INJECTOR | Freq: Two times a day (BID) | SUBCUTANEOUS | 2 refills | Status: DC

## 2018-11-30 MED ORDER — NYSTATIN 100000 UNIT/GM EX POWD: Freq: Two times a day (BID) | CUTANEOUS | 1 refills | Status: DC

## 2018-11-30 NOTE — Progress Notes (Signed)
Inpatient Diabetes Program Recommendations  AACE/ADA: New Consensus Statement on Inpatient Glycemic Control (2019)  Target Ranges:  Prepandial:   less than 140 mg/dL      Peak postprandial:   less than 180 mg/dL (1-2 hours)      Critically ill patients:  140 - 180 mg/dL  Results for [name], [name] (MRN [id number]) as of 11/30/2018 12:24  Ref. Range 11/29/2018 16:20 11/29/2018 19:19 11/29/2018 20:37 11/29/2018 21:48 11/29/2018 22:53 11/30/2018 03:04 11/30/2018 07:21 11/30/2018 11:26  Glucose-Capillary Latest Ref Range: 70 - 99 mg/dL 423 (H) 314 (H) 314 (H) 351 (H) 338 (H) 252 (H) 215 (H) 284 (H)    Review of Glycemic Control  Diabetes history: DM2 Outpatient Diabetes medications: Lantus 40 units QHS, Januvia 50 mg every evening Current orders for Inpatient glycemic control: Lantus 40 units QHS, Novolog 0-15 units TID with meals, Tradjenta 5 mg QHS  Inpatient Diabetes Program Recommendations:   Insulin - Basal: Please consider increasing Lantus to 43 units QHS.  Insulin - Meal Coverage: While inpatient, please consider ordering Novolog 4 units TID with meals for meal coverage if patient eats at least 50% of meals.  NOTE: Noted consult for Diabetes Coordinator. Diabetes Coordinator is not on campus over the weekend but available by pager from 8am to 5pm for questions or concerns. Chart reviewed. Patient recently inpatient from 11/17/18 to 11/18/18 with slurred speech, acute kidney injury, and hyperglycemia. Patient admitted on 11/29/18 with hyperglycemia (initial gluose 423 mg/dl). Patient is followed by Dr. Dorris Fetch and was last seen on 10/02/18. Glucose remains elevated despite patient receiving Lantus 40 units QHS (same as outpatient dose). Recommend increasing Lantus to 43 units QHS and adding Novolog 4 units TID with meals for meal coverage if patient is eating well. Will follow up with patient on Monday.  Thanks, Barnie Alderman, RN, MSN, CDE Diabetes Coordinator Inpatient Diabetes  Program [phone] (Team Pager from 8am to 5pm)

## 2018-11-30 NOTE — Discharge Summary (Signed)
Physician Discharge Summary  [name] PFX:[id number] DOB: 08-Jun-1929 DOA: 11/29/2018  PCP: Sharilyn Sites, MD  Admit date: 11/29/2018 Discharge date: 11/30/2018  Time spent: 35 minutes  Recommendations for Outpatient Follow-up:  Repeat basic metabolic panel to follow electrolytes and renal function Reassess CBGs and further adjust hypoglycemic regimen as needed Follow improvement/resolution of skin candidiasis (affecting under breast skin, groin and Pannus)  Discharge Diagnoses:  Principal Problem:   Uncontrolled type 2 diabetes mellitus with hyperglycemia (HCC) Active Problems:   Essential hypertension, benign   Class 1 obesity due to excess calories without serious comorbidity with body mass index (BMI) of 31.0 to 31.9 in adult   Hyperlipidemia Skin candidiasis Chronic kidney disease is stage III  Discharge Condition: Stable and improved.  Patient discharged home with instruction to follow-up with PCP on 12/02/2018 and also to arrange follow-up with her endocrinologist.  Diet recommendation: Heart healthy and modified carbohydrate diet.  Filed Weights   11/29/18 1622  Weight: 93 kg    History of present illness:  As per H&P written by Dr. Alcario Drought on 11/29/2018 84 y.o. female with medical history significant of DM2, HTN, TIA ~2 weeks ago.  Patient presents to the ED with c/o hyperglycemia and uncontrolled BGLs for the past 4 days.  She says her BGL usually runs 130 when she checks it 2x a day but over past couple of days BGLs running up in the 400s.  Today BGL before breakfast was 481.  No recent med changes, she takes Januvia and 40u Lantus QHS.  Did get refill of Lantus pen this past week but states pen looks the same as it always does, she noticed no differences in appearance to suggest that she might have been given the wrong med or anything.  No current illness or symptoms: no fever, chills, N/V/D, dysuria.  No foot ulcers.  No cough.  No abd pain.  No recurrence  of the slurred speech / facial droop symptoms she had x2 weeks ago which prompted the TIA admission.  Hospital Course:  1-hyperglycemia in the setting of uncontrolled type 2 diabetes -Most recent A1c in November 2019 was 9.0 -On arrival patient sugar up to 400 range -Good response to the use of sliding scale insulin and Lantus -At discharge patient Lantus dose has been adjusted and the patient has been instructed to resume the use of a Januvia and to follow modified carbohydrate diet. -Also instructed to maintain adequate hydration and to check her CBGs 3 times a day -Outpatient follow-up with her PCP and endocrinologist to further adjust hypoglycemic regimen.  2-essential hypertension -Stable and well-controlled -Continue current antihypertensive agents.  3-chronic kidney disease stage III: Associated with diabetes and high blood pressure -Has remained stable and at baseline -Instructed to maintain adequate hydration -Repeat basic metabolic panel to evaluate stability and trend of her renal function.  4-class I obesity -Low calorie diet and portion control, discussed with patient -Body mass index is 31.17 kg/m.  5-history of neuropathy -Continue gabapentin  6-skin candidiasis -Patient will be treated with nystatin powder -Advised to keep area clean and dry.  Procedures:  None  Consultations:  None  Discharge Exam: Vitals:   11/29/18 2234 11/30/18 0641  BP: (!) 148/51 (!) 149/58  Pulse: 61 (!) 53  Resp: 17 16  Temp: 98.2 F (36.8 C) 98.2 F (36.8 C)  SpO2: 96% 96%    General: Afebrile, no nausea, no vomiting, no chest pain, no acute complaints. Cardiovascular: S1 and S2, no rubs, no gallops, no  JVD. Respiratory: Clear to auscultation bilaterally Abdomen: Soft, nontender, nondistended, positive bowel sounds Extremities: Trace edema, no cyanosis, no clubbing Skin: Patient with erythematosus and moist rash affecting pannus, groin and under breath  skin.  Discharge Instructions   Discharge Instructions    Diet Carb Modified   Complete by:  As directed    Discharge instructions   Complete by:  As directed    Take medications as prescribed Follow-up with PCP as previously scheduled Maintain adequate hydration Do not skip any meals and make sure that you check your blood sugar level at least 3 times a day. Modify carbohydrate diet (aiming for 56 g of carbs per day).     Allergies as of 11/30/2018      Reactions   Codeine Nausea And Vomiting      Medication List    TAKE these medications   aspirin 81 MG chewable tablet Chew 1 tablet (81 mg total) by mouth daily. What changed:  when to take this   B-12 PO Take 1 tablet by mouth every evening.   clonazePAM 0.5 MG tablet Commonly known as:  KLONOPIN Take 0.5 tablets (0.25 mg total) by mouth daily. What changed:    how much to take  when to take this   furosemide 20 MG tablet Commonly known as:  LASIX Take 20 mg by mouth daily as needed (for fluid retention).   gabapentin 300 MG capsule Commonly known as:  NEURONTIN Take 1 capsule (300 mg total) by mouth 3 (three) times daily. What changed:    how much to take  when to take this   glucose blood test strip Use as instructed tid. E11.65   Insulin Glargine 100 UNIT/ML Solostar Pen Commonly known as:  LANTUS Inject 23 Units into the skin 2 (two) times daily. What changed:    how much to take  when to take this   Insulin Pen Needle 31G X 8 MM Misc 1 each by Does not apply route as directed.   JANUVIA 50 MG tablet Generic drug:  sitaGLIPtin TAKE 1 TABLET BY MOUTH  DAILY What changed:    how much to take  when to take this   lisinopril-hydrochlorothiazide 20-12.5 MG tablet Commonly known as:  PRINZIDE,ZESTORETIC Take 1 tablet by mouth 2 (two) times daily.   metoprolol succinate 50 MG 24 hr tablet Commonly known as:  TOPROL-XL Take 50 mg by mouth at bedtime. Take with or immediately following a  meal.   MYRBETRIQ 50 MG Tb24 tablet Generic drug:  mirabegron ER Take 50 mg by mouth every evening.   nystatin powder Commonly known as:  MYCOSTATIN/NYSTOP Apply topically 2 (two) times daily. Apply to affected skin for treatment of fungal infection. Please keep area clean and dry.   simvastatin 20 MG tablet Commonly known as:  ZOCOR Take 20 mg by mouth every evening.   tiZANidine 2 MG tablet Commonly known as:  ZANAFLEX Take by mouth every 6 (six) hours as needed for muscle spasms.   Vitamin D3 125 MCG (5000 UT) Caps Take 1 capsule (5,000 Units total) by mouth daily. What changed:  when to take this      Allergies  Allergen Reactions  . Codeine Nausea And Vomiting   Follow-up Information    Sharilyn Sites, MD. Go on 12/02/2018.   Specialty:  Family Medicine Why:  as previously schedule.  Contact information: [address] [place] [address] [phone]            The results of  significant diagnostics from this hospitalization (including imaging, microbiology, ancillary and laboratory) are listed below for reference.    Significant Diagnostic Studies: Dg Chest 2 View  Result Date: 11/29/2018 CLINICAL DATA:  Cough. EXAM: CHEST - 2 VIEW COMPARISON:  08/15/2009. FINDINGS: Low lung volumes. The lungs are clear without focal pneumonia, edema, pneumothorax or pleural effusion. Cardiopericardial silhouette is at upper limits of normal for size. The visualized bony structures of the thorax are intact. IMPRESSION: No active cardiopulmonary disease. Electronically Signed   By: Misty Stanley M.D.   On: 11/29/2018 17:16   Ct Head Wo Contrast  Result Date: 11/17/2018 CLINICAL DATA:  Slurred speech and left facial droop. EXAM: CT HEAD WITHOUT CONTRAST TECHNIQUE: Contiguous axial images were obtained from the base of the skull through the vertex without intravenous contrast. COMPARISON:  None. FINDINGS: Brain: There is no mass, hemorrhage or extra-axial collection.  The size and configuration of the ventricles and extra-axial CSF spaces are normal for age. The brain parenchyma is normal, without evidence of acute or chronic infarction. Vascular: Atherosclerotic calcification of the internal carotid arteries at the skull base. No abnormal hyperdensity of the major intracranial arteries or dural venous sinuses. Skull: The visualized skull base, calvarium and extracranial soft tissues are normal. Sinuses/Orbits: No fluid levels or advanced mucosal thickening of the visualized paranasal sinuses. No mastoid or middle ear effusion. The orbits are normal. IMPRESSION: Normal aging brain. Electronically Signed   By: Ulyses Jarred M.D.   On: 11/17/2018 02:17   Mr Brain Wo Contrast  Result Date: 11/17/2018 CLINICAL DATA:  84 year old female with left facial droop for 2 days, weakness. EXAM: MRI HEAD WITHOUT CONTRAST TECHNIQUE: Multiplanar, multiecho pulse sequences of the brain and surrounding structures were obtained without intravenous contrast. COMPARISON:  Head CT without contrast 0151 hours today. Cervical spine MRI 03/18/2015. FINDINGS: Brain: No restricted diffusion to suggest acute infarction. No midline shift, mass effect, evidence of mass lesion, ventriculomegaly, extra-axial collection or acute intracranial hemorrhage. Cervicomedullary junction and pituitary are within normal limits. No cortical encephalomalacia or chronic cerebral blood products identified. Minimal to mild nonspecific mostly periventricular cerebral white matter T2 and FLAIR hyperintensity. The deep gray matter nuclei, brainstem, and cerebellum are normal for age. Vascular: Major intracranial vascular flow voids are preserved. The left vertebral artery is mildly dominant as seen on the prior MRI. Skull and upper cervical spine: Stable visible upper cervical spine since 2016. Visualized bone marrow signal is within normal limits. Sinuses/Orbits: Postoperative changes to both globes. Otherwise negative  orbits. Paranasal Visualized paranasal sinuses and mastoids are stable and well pneumatized. Other: Grossly normal visible internal auditory structures. Normal stylomastoid foramina. Bilateral parotid glands appear normal. Other face and scalp soft tissues appear negative. IMPRESSION: No acute intracranial abnormality. No explanation for left facial droop and essentially normal for age noncontrast MRI appearance of the brain. Electronically Signed   By: Genevie Ann M.D.   On: 11/17/2018 10:04   US Carotid Bilateral (at [facility] And [facility] Only)  Result Date: 11/17/2018 CLINICAL DATA:  84 year old female with a history of slurred speech EXAM: BILATERAL CAROTID DUPLEX ULTRASOUND TECHNIQUE: Pearline Cables scale imaging, color Doppler and duplex ultrasound were performed of bilateral carotid and vertebral arteries in the neck. COMPARISON:  None. FINDINGS: Criteria: Quantification of carotid stenosis is based on velocity parameters that correlate the residual internal carotid diameter with NASCET-based stenosis levels, using the diameter of the distal internal carotid lumen as the denominator for stenosis measurement. The following velocity measurements were obtained: RIGHT ICA:  Systolic 92 cm/sec, Diastolic 14 cm/sec CCA:  89 cm/sec SYSTOLIC ICA/CCA RATIO:  1.0 ECA:  116 cm/sec LEFT ICA:  Systolic 176 cm/sec, Diastolic 20 cm/sec CCA:  160 cm/sec SYSTOLIC ICA/CCA RATIO:  1.1 ECA:  109 cm/sec Right Brachial SBP: Not acquired Left Brachial SBP: Not acquired RIGHT CAROTID ARTERY: No significant calcified disease of the right common carotid artery. Intermediate waveform maintained. Heterogeneous plaque without significant calcifications at the right carotid bifurcation. Low resistance waveform of the right ICA. Mild tortuosity. RIGHT VERTEBRAL ARTERY: Antegrade flow with low resistance waveform. LEFT CAROTID ARTERY: No significant calcified disease of the left common carotid artery. Intermediate waveform maintained. Heterogeneous plaque at  the left carotid bifurcation without significant calcifications. Low resistance waveform of the left ICA. Mild tortuosity LEFT VERTEBRAL ARTERY:  Antegrade flow with low resistance waveform. IMPRESSION: Color duplex indicates minimal heterogeneous plaque, with no hemodynamically significant stenosis by duplex criteria in the extracranial cerebrovascular circulation. Signed, Dulcy Fanny. Dellia Nims, RPVI [facility] Radiology Electronically Signed   By: Corrie Mckusick D.O.   On: 11/17/2018 10:50   Mr Jodene Nam Head/brain VP Cm  Result Date: 11/17/2018 CLINICAL DATA:  84 year old female with left facial droop for 2 days, weakness. EXAM: MRA HEAD WITHOUT CONTRAST TECHNIQUE: Angiographic images of the Circle of Willis were obtained using MRA technique without intravenous contrast. COMPARISON:  Brain MRI today reported separately. FINDINGS: Antegrade flow in the posterior circulation with dominant left vertebral artery. Normal left PICA origin. The right AICA appears dominant and is patent. Patent vertebrobasilar junction and basilar artery without stenosis. Normal SCA is and left PCA origin. Fetal type right PCA origin with tortuous right P1. Left Posterior communicating artery is diminutive or absent. Bilateral PCA branches are within normal limits. Antegrade flow in both ICA siphons. Mildly tortuous cervical left ICA. No siphon stenosis. Ophthalmic and right posterior communicating artery origins are normal. Patent carotid termini, MCA and ACA origins. The left A1 is mildly dominant. Anterior communicating artery and visible ACA branches are within normal limits. MCA M1 segments, bifurcations, and visible MCA branches are within normal limits. IMPRESSION: Normal for age intracranial MRA. Electronically Signed   By: Genevie Ann M.D.   On: 11/17/2018 10:07   Labs: Basic Metabolic Panel: Recent Labs  Lab 11/29/18 1722 11/30/18 0612  NA 136 138  K 4.0 3.1*  CL 99 104  CO2  27 27  GLUCOSE 460* 249*  BUN 18 14  CREATININE 1.26* 1.12*  CALCIUM 10.2 9.2   Liver Function Tests: Recent Labs  Lab 11/29/18 1722  AST 21  ALT 23  ALKPHOS 85  BILITOT 0.9  PROT 7.1  ALBUMIN 3.9   CBC: Recent Labs  Lab 11/29/18 1722  WBC 8.0  NEUTROABS 5.3  HGB 14.2  HCT 43.8  MCV 90.3  PLT 197   CBG: Recent Labs  Lab 11/29/18 2148 11/29/18 2253 11/30/18 0304 11/30/18 0721 11/30/18 1126  GLUCAP 351* 338* 252* 215* 284*   Signed:  Barton Dubois MD.  [facility] 11/30/2018, 1:21 PM

## 2018-11-30 NOTE — Progress Notes (Signed)
Removed IV-clean, dry, intact. Reviewed d/c paperwork with patient and husband. Reviewed medication changes and new medication. Answered all questions. Wheeled stable patient and belongings to main entrance where she was picked up by husband to go home.

## 2018-12-02 DIAGNOSIS — Z1389 Encounter for screening for other disorder: Secondary | ICD-10-CM | POA: Diagnosis not present

## 2018-12-02 DIAGNOSIS — M1991 Primary osteoarthritis, unspecified site: Secondary | ICD-10-CM | POA: Diagnosis not present

## 2018-12-02 DIAGNOSIS — N183 Chronic kidney disease, stage 3 (moderate): Secondary | ICD-10-CM | POA: Diagnosis not present

## 2018-12-02 DIAGNOSIS — Z0001 Encounter for general adult medical examination with abnormal findings: Secondary | ICD-10-CM | POA: Diagnosis not present

## 2018-12-02 DIAGNOSIS — E1022 Type 1 diabetes mellitus with diabetic chronic kidney disease: Secondary | ICD-10-CM | POA: Diagnosis not present

## 2018-12-02 DIAGNOSIS — E6609 Other obesity due to excess calories: Secondary | ICD-10-CM | POA: Diagnosis not present

## 2018-12-02 DIAGNOSIS — I639 Cerebral infarction, unspecified: Secondary | ICD-10-CM | POA: Diagnosis not present

## 2018-12-02 DIAGNOSIS — Z6833 Body mass index (BMI) 33.0-33.9, adult: Secondary | ICD-10-CM | POA: Diagnosis not present

## 2018-12-03 DIAGNOSIS — R2689 Other abnormalities of gait and mobility: Secondary | ICD-10-CM | POA: Diagnosis not present

## 2018-12-03 DIAGNOSIS — I10 Essential (primary) hypertension: Secondary | ICD-10-CM | POA: Diagnosis not present

## 2018-12-03 DIAGNOSIS — E1165 Type 2 diabetes mellitus with hyperglycemia: Secondary | ICD-10-CM | POA: Diagnosis not present

## 2018-12-03 DIAGNOSIS — Z794 Long term (current) use of insulin: Secondary | ICD-10-CM | POA: Diagnosis not present

## 2018-12-03 DIAGNOSIS — Z9181 History of falling: Secondary | ICD-10-CM | POA: Diagnosis not present

## 2018-12-03 DIAGNOSIS — E1140 Type 2 diabetes mellitus with diabetic neuropathy, unspecified: Secondary | ICD-10-CM | POA: Diagnosis not present

## 2018-12-04 DIAGNOSIS — E1165 Type 2 diabetes mellitus with hyperglycemia: Secondary | ICD-10-CM | POA: Diagnosis not present

## 2018-12-04 DIAGNOSIS — I10 Essential (primary) hypertension: Secondary | ICD-10-CM | POA: Diagnosis not present

## 2018-12-04 DIAGNOSIS — E1140 Type 2 diabetes mellitus with diabetic neuropathy, unspecified: Secondary | ICD-10-CM | POA: Diagnosis not present

## 2018-12-04 DIAGNOSIS — Z794 Long term (current) use of insulin: Secondary | ICD-10-CM | POA: Diagnosis not present

## 2018-12-04 DIAGNOSIS — Z9181 History of falling: Secondary | ICD-10-CM | POA: Diagnosis not present

## 2018-12-04 DIAGNOSIS — R2689 Other abnormalities of gait and mobility: Secondary | ICD-10-CM | POA: Diagnosis not present

## 2018-12-05 ENCOUNTER — Ambulatory Visit: Payer: Medicare Other | Admitting: Orthopedic Surgery

## 2018-12-08 ENCOUNTER — Encounter: Payer: Self-pay | Admitting: Orthopedic Surgery

## 2018-12-08 ENCOUNTER — Ambulatory Visit: Payer: Medicare Other | Admitting: Orthopedic Surgery

## 2018-12-10 DIAGNOSIS — E1165 Type 2 diabetes mellitus with hyperglycemia: Secondary | ICD-10-CM | POA: Diagnosis not present

## 2018-12-10 DIAGNOSIS — Z9181 History of falling: Secondary | ICD-10-CM | POA: Diagnosis not present

## 2018-12-10 DIAGNOSIS — E1140 Type 2 diabetes mellitus with diabetic neuropathy, unspecified: Secondary | ICD-10-CM | POA: Diagnosis not present

## 2018-12-10 DIAGNOSIS — R2689 Other abnormalities of gait and mobility: Secondary | ICD-10-CM | POA: Diagnosis not present

## 2018-12-10 DIAGNOSIS — Z794 Long term (current) use of insulin: Secondary | ICD-10-CM | POA: Diagnosis not present

## 2018-12-10 DIAGNOSIS — I10 Essential (primary) hypertension: Secondary | ICD-10-CM | POA: Diagnosis not present

## 2018-12-11 ENCOUNTER — Ambulatory Visit: Payer: PRIVATE HEALTH INSURANCE | Admitting: Neurology

## 2018-12-11 DIAGNOSIS — Z9181 History of falling: Secondary | ICD-10-CM | POA: Diagnosis not present

## 2018-12-11 DIAGNOSIS — E1165 Type 2 diabetes mellitus with hyperglycemia: Secondary | ICD-10-CM | POA: Diagnosis not present

## 2018-12-11 DIAGNOSIS — R2689 Other abnormalities of gait and mobility: Secondary | ICD-10-CM | POA: Diagnosis not present

## 2018-12-11 DIAGNOSIS — I10 Essential (primary) hypertension: Secondary | ICD-10-CM | POA: Diagnosis not present

## 2018-12-11 DIAGNOSIS — Z794 Long term (current) use of insulin: Secondary | ICD-10-CM | POA: Diagnosis not present

## 2018-12-11 DIAGNOSIS — E1140 Type 2 diabetes mellitus with diabetic neuropathy, unspecified: Secondary | ICD-10-CM | POA: Diagnosis not present

## 2018-12-12 DIAGNOSIS — E1140 Type 2 diabetes mellitus with diabetic neuropathy, unspecified: Secondary | ICD-10-CM | POA: Diagnosis not present

## 2018-12-12 DIAGNOSIS — Z9181 History of falling: Secondary | ICD-10-CM | POA: Diagnosis not present

## 2018-12-12 DIAGNOSIS — I10 Essential (primary) hypertension: Secondary | ICD-10-CM | POA: Diagnosis not present

## 2018-12-12 DIAGNOSIS — R2689 Other abnormalities of gait and mobility: Secondary | ICD-10-CM | POA: Diagnosis not present

## 2018-12-18 DIAGNOSIS — Z9181 History of falling: Secondary | ICD-10-CM | POA: Diagnosis not present

## 2018-12-18 DIAGNOSIS — W19XXXA Unspecified fall, initial encounter: Secondary | ICD-10-CM | POA: Diagnosis not present

## 2018-12-18 DIAGNOSIS — E1140 Type 2 diabetes mellitus with diabetic neuropathy, unspecified: Secondary | ICD-10-CM | POA: Diagnosis not present

## 2018-12-18 DIAGNOSIS — I10 Essential (primary) hypertension: Secondary | ICD-10-CM | POA: Diagnosis not present

## 2018-12-18 DIAGNOSIS — R5381 Other malaise: Secondary | ICD-10-CM | POA: Diagnosis not present

## 2018-12-18 DIAGNOSIS — Z794 Long term (current) use of insulin: Secondary | ICD-10-CM | POA: Diagnosis not present

## 2018-12-18 DIAGNOSIS — E1165 Type 2 diabetes mellitus with hyperglycemia: Secondary | ICD-10-CM | POA: Diagnosis not present

## 2018-12-18 DIAGNOSIS — R2689 Other abnormalities of gait and mobility: Secondary | ICD-10-CM | POA: Diagnosis not present

## 2018-12-29 DIAGNOSIS — L110 Acquired keratosis follicularis: Secondary | ICD-10-CM | POA: Diagnosis not present

## 2018-12-29 DIAGNOSIS — E1151 Type 2 diabetes mellitus with diabetic peripheral angiopathy without gangrene: Secondary | ICD-10-CM | POA: Diagnosis not present

## 2018-12-29 DIAGNOSIS — E1140 Type 2 diabetes mellitus with diabetic neuropathy, unspecified: Secondary | ICD-10-CM | POA: Diagnosis not present

## 2018-12-29 DIAGNOSIS — M79671 Pain in right foot: Secondary | ICD-10-CM | POA: Diagnosis not present

## 2018-12-29 DIAGNOSIS — M79672 Pain in left foot: Secondary | ICD-10-CM | POA: Diagnosis not present

## 2018-12-30 DIAGNOSIS — Z9181 History of falling: Secondary | ICD-10-CM | POA: Diagnosis not present

## 2018-12-30 DIAGNOSIS — Z7982 Long term (current) use of aspirin: Secondary | ICD-10-CM | POA: Diagnosis not present

## 2018-12-30 DIAGNOSIS — Z79899 Other long term (current) drug therapy: Secondary | ICD-10-CM | POA: Diagnosis not present

## 2018-12-30 DIAGNOSIS — E1165 Type 2 diabetes mellitus with hyperglycemia: Secondary | ICD-10-CM | POA: Diagnosis not present

## 2018-12-30 DIAGNOSIS — I10 Essential (primary) hypertension: Secondary | ICD-10-CM | POA: Diagnosis not present

## 2018-12-30 DIAGNOSIS — E1140 Type 2 diabetes mellitus with diabetic neuropathy, unspecified: Secondary | ICD-10-CM | POA: Diagnosis not present

## 2018-12-30 DIAGNOSIS — R2689 Other abnormalities of gait and mobility: Secondary | ICD-10-CM | POA: Diagnosis not present

## 2018-12-30 DIAGNOSIS — Z794 Long term (current) use of insulin: Secondary | ICD-10-CM | POA: Diagnosis not present

## 2019-01-01 DIAGNOSIS — Z9181 History of falling: Secondary | ICD-10-CM | POA: Diagnosis not present

## 2019-01-01 DIAGNOSIS — R2689 Other abnormalities of gait and mobility: Secondary | ICD-10-CM | POA: Diagnosis not present

## 2019-01-01 DIAGNOSIS — Z79899 Other long term (current) drug therapy: Secondary | ICD-10-CM | POA: Diagnosis not present

## 2019-01-01 DIAGNOSIS — E1140 Type 2 diabetes mellitus with diabetic neuropathy, unspecified: Secondary | ICD-10-CM | POA: Diagnosis not present

## 2019-01-01 DIAGNOSIS — E1165 Type 2 diabetes mellitus with hyperglycemia: Secondary | ICD-10-CM | POA: Diagnosis not present

## 2019-01-01 DIAGNOSIS — Z7982 Long term (current) use of aspirin: Secondary | ICD-10-CM | POA: Diagnosis not present

## 2019-01-01 DIAGNOSIS — Z794 Long term (current) use of insulin: Secondary | ICD-10-CM | POA: Diagnosis not present

## 2019-01-01 DIAGNOSIS — I10 Essential (primary) hypertension: Secondary | ICD-10-CM | POA: Diagnosis not present

## 2019-01-06 ENCOUNTER — Ambulatory Visit: Payer: Medicare Other | Admitting: "Endocrinology

## 2019-01-06 DIAGNOSIS — E1165 Type 2 diabetes mellitus with hyperglycemia: Secondary | ICD-10-CM | POA: Diagnosis not present

## 2019-01-06 DIAGNOSIS — I10 Essential (primary) hypertension: Secondary | ICD-10-CM | POA: Diagnosis not present

## 2019-01-06 DIAGNOSIS — R2689 Other abnormalities of gait and mobility: Secondary | ICD-10-CM | POA: Diagnosis not present

## 2019-01-06 DIAGNOSIS — Z79899 Other long term (current) drug therapy: Secondary | ICD-10-CM | POA: Diagnosis not present

## 2019-01-06 DIAGNOSIS — Z9181 History of falling: Secondary | ICD-10-CM | POA: Diagnosis not present

## 2019-01-06 DIAGNOSIS — Z7982 Long term (current) use of aspirin: Secondary | ICD-10-CM | POA: Diagnosis not present

## 2019-01-06 DIAGNOSIS — Z794 Long term (current) use of insulin: Secondary | ICD-10-CM | POA: Diagnosis not present

## 2019-01-06 DIAGNOSIS — E1140 Type 2 diabetes mellitus with diabetic neuropathy, unspecified: Secondary | ICD-10-CM | POA: Diagnosis not present

## 2019-01-07 ENCOUNTER — Ambulatory Visit (INDEPENDENT_AMBULATORY_CARE_PROVIDER_SITE_OTHER): Payer: Medicare Other | Admitting: Orthopedic Surgery

## 2019-01-07 ENCOUNTER — Encounter: Payer: Self-pay | Admitting: Orthopedic Surgery

## 2019-01-07 VITALS — BP 160/69 | HR 163 | Ht 65.0 in

## 2019-01-07 DIAGNOSIS — M1712 Unilateral primary osteoarthritis, left knee: Secondary | ICD-10-CM

## 2019-01-07 DIAGNOSIS — M1711 Unilateral primary osteoarthritis, right knee: Secondary | ICD-10-CM | POA: Diagnosis not present

## 2019-01-07 DIAGNOSIS — M25562 Pain in left knee: Secondary | ICD-10-CM

## 2019-01-07 DIAGNOSIS — M25561 Pain in right knee: Secondary | ICD-10-CM | POA: Diagnosis not present

## 2019-01-07 DIAGNOSIS — G8929 Other chronic pain: Secondary | ICD-10-CM

## 2019-01-07 NOTE — Progress Notes (Signed)
Chief Complaint  Patient presents with  . Knee Pain    bilateral      Procedure note left knee injection verbal consent was obtained to inject left knee joint  Timeout was completed to confirm the site of injection  The medications used were 40 mg of Depo-Medrol and 1% lidocaine 3 cc  Anesthesia was provided by ethyl chloride and the skin was prepped with alcohol.  After cleaning the skin with alcohol a 20-gauge needle was used to inject the left knee joint. There were no complications. A sterile bandage was applied.   Procedure note right knee injection verbal consent was obtained to inject right knee joint  Timeout was completed to confirm the site of injection  The medications used were 40 mg of Depo-Medrol and 1% lidocaine 3 cc  Anesthesia was provided by ethyl chloride and the skin was prepped with alcohol.  After cleaning the skin with alcohol a 20-gauge needle was used to inject the right knee joint. There were no complications. A sterile bandage was applied.  . Encounter Diagnoses  Name Primary?  . Primary osteoarthritis of right knee Yes  . Primary osteoarthritis of left knee   . Chronic pain of both knees

## 2019-01-08 DIAGNOSIS — Z9181 History of falling: Secondary | ICD-10-CM | POA: Diagnosis not present

## 2019-01-08 DIAGNOSIS — I10 Essential (primary) hypertension: Secondary | ICD-10-CM | POA: Diagnosis not present

## 2019-01-08 DIAGNOSIS — R2689 Other abnormalities of gait and mobility: Secondary | ICD-10-CM | POA: Diagnosis not present

## 2019-01-08 DIAGNOSIS — E1165 Type 2 diabetes mellitus with hyperglycemia: Secondary | ICD-10-CM | POA: Diagnosis not present

## 2019-01-08 DIAGNOSIS — Z79899 Other long term (current) drug therapy: Secondary | ICD-10-CM | POA: Diagnosis not present

## 2019-01-08 DIAGNOSIS — Z794 Long term (current) use of insulin: Secondary | ICD-10-CM | POA: Diagnosis not present

## 2019-01-08 DIAGNOSIS — Z7982 Long term (current) use of aspirin: Secondary | ICD-10-CM | POA: Diagnosis not present

## 2019-01-08 DIAGNOSIS — E1140 Type 2 diabetes mellitus with diabetic neuropathy, unspecified: Secondary | ICD-10-CM | POA: Diagnosis not present

## 2019-01-12 ENCOUNTER — Ambulatory Visit: Payer: PRIVATE HEALTH INSURANCE | Admitting: Neurology

## 2019-01-13 DIAGNOSIS — I10 Essential (primary) hypertension: Secondary | ICD-10-CM | POA: Diagnosis not present

## 2019-01-13 DIAGNOSIS — E1165 Type 2 diabetes mellitus with hyperglycemia: Secondary | ICD-10-CM | POA: Diagnosis not present

## 2019-01-13 DIAGNOSIS — Z79899 Other long term (current) drug therapy: Secondary | ICD-10-CM | POA: Diagnosis not present

## 2019-01-13 DIAGNOSIS — Z794 Long term (current) use of insulin: Secondary | ICD-10-CM | POA: Diagnosis not present

## 2019-01-13 DIAGNOSIS — Z7982 Long term (current) use of aspirin: Secondary | ICD-10-CM | POA: Diagnosis not present

## 2019-01-13 DIAGNOSIS — R2689 Other abnormalities of gait and mobility: Secondary | ICD-10-CM | POA: Diagnosis not present

## 2019-01-13 DIAGNOSIS — E1140 Type 2 diabetes mellitus with diabetic neuropathy, unspecified: Secondary | ICD-10-CM | POA: Diagnosis not present

## 2019-01-13 DIAGNOSIS — Z9181 History of falling: Secondary | ICD-10-CM | POA: Diagnosis not present

## 2019-01-16 DIAGNOSIS — Z7982 Long term (current) use of aspirin: Secondary | ICD-10-CM | POA: Diagnosis not present

## 2019-01-16 DIAGNOSIS — Z79899 Other long term (current) drug therapy: Secondary | ICD-10-CM | POA: Diagnosis not present

## 2019-01-16 DIAGNOSIS — E1140 Type 2 diabetes mellitus with diabetic neuropathy, unspecified: Secondary | ICD-10-CM | POA: Diagnosis not present

## 2019-01-16 DIAGNOSIS — E1165 Type 2 diabetes mellitus with hyperglycemia: Secondary | ICD-10-CM | POA: Diagnosis not present

## 2019-01-16 DIAGNOSIS — Z9181 History of falling: Secondary | ICD-10-CM | POA: Diagnosis not present

## 2019-01-16 DIAGNOSIS — I10 Essential (primary) hypertension: Secondary | ICD-10-CM | POA: Diagnosis not present

## 2019-01-16 DIAGNOSIS — Z794 Long term (current) use of insulin: Secondary | ICD-10-CM | POA: Diagnosis not present

## 2019-01-16 DIAGNOSIS — R2689 Other abnormalities of gait and mobility: Secondary | ICD-10-CM | POA: Diagnosis not present

## 2019-01-25 DIAGNOSIS — E1165 Type 2 diabetes mellitus with hyperglycemia: Secondary | ICD-10-CM | POA: Diagnosis not present

## 2019-01-25 DIAGNOSIS — R5381 Other malaise: Secondary | ICD-10-CM | POA: Diagnosis not present

## 2019-01-25 DIAGNOSIS — R51 Headache: Secondary | ICD-10-CM | POA: Diagnosis not present

## 2019-02-06 DIAGNOSIS — Z681 Body mass index (BMI) 19 or less, adult: Secondary | ICD-10-CM | POA: Diagnosis not present

## 2019-02-06 DIAGNOSIS — E1165 Type 2 diabetes mellitus with hyperglycemia: Secondary | ICD-10-CM | POA: Diagnosis not present

## 2019-02-06 DIAGNOSIS — I639 Cerebral infarction, unspecified: Secondary | ICD-10-CM | POA: Diagnosis not present

## 2019-02-06 DIAGNOSIS — Z23 Encounter for immunization: Secondary | ICD-10-CM | POA: Diagnosis not present

## 2019-02-06 DIAGNOSIS — M1991 Primary osteoarthritis, unspecified site: Secondary | ICD-10-CM | POA: Diagnosis not present

## 2019-02-06 DIAGNOSIS — I10 Essential (primary) hypertension: Secondary | ICD-10-CM | POA: Diagnosis not present

## 2019-02-07 ENCOUNTER — Encounter (HOSPITAL_COMMUNITY): Payer: Self-pay

## 2019-02-07 ENCOUNTER — Other Ambulatory Visit: Payer: Self-pay

## 2019-02-07 ENCOUNTER — Inpatient Hospital Stay (HOSPITAL_COMMUNITY)
Admission: EM | Admit: 2019-02-07 | Discharge: 2019-02-10 | DRG: 639 | Disposition: A | Discharge status: Home Health | Payer: Medicare Other | Attending: Internal Medicine | Admitting: Internal Medicine

## 2019-02-07 DIAGNOSIS — R600 Localized edema: Secondary | ICD-10-CM

## 2019-02-07 DIAGNOSIS — Z794 Long term (current) use of insulin: Secondary | ICD-10-CM

## 2019-02-07 DIAGNOSIS — Z885 Allergy status to narcotic agent status: Secondary | ICD-10-CM | POA: Diagnosis not present

## 2019-02-07 DIAGNOSIS — E785 Hyperlipidemia, unspecified: Secondary | ICD-10-CM | POA: Diagnosis present

## 2019-02-07 DIAGNOSIS — E559 Vitamin D deficiency, unspecified: Secondary | ICD-10-CM | POA: Diagnosis present

## 2019-02-07 DIAGNOSIS — N3289 Other specified disorders of bladder: Secondary | ICD-10-CM | POA: Diagnosis present

## 2019-02-07 DIAGNOSIS — Z79899 Other long term (current) drug therapy: Secondary | ICD-10-CM | POA: Diagnosis not present

## 2019-02-07 DIAGNOSIS — E1165 Type 2 diabetes mellitus with hyperglycemia: Secondary | ICD-10-CM | POA: Diagnosis not present

## 2019-02-07 DIAGNOSIS — M7918 Myalgia, other site: Secondary | ICD-10-CM | POA: Diagnosis present

## 2019-02-07 DIAGNOSIS — R296 Repeated falls: Secondary | ICD-10-CM | POA: Diagnosis present

## 2019-02-07 DIAGNOSIS — Z8249 Family history of ischemic heart disease and other diseases of the circulatory system: Secondary | ICD-10-CM | POA: Diagnosis not present

## 2019-02-07 DIAGNOSIS — Z9181 History of falling: Secondary | ICD-10-CM | POA: Diagnosis not present

## 2019-02-07 DIAGNOSIS — R32 Unspecified urinary incontinence: Secondary | ICD-10-CM | POA: Diagnosis present

## 2019-02-07 DIAGNOSIS — Z823 Family history of stroke: Secondary | ICD-10-CM | POA: Diagnosis not present

## 2019-02-07 DIAGNOSIS — N289 Disorder of kidney and ureter, unspecified: Secondary | ICD-10-CM | POA: Diagnosis present

## 2019-02-07 DIAGNOSIS — R739 Hyperglycemia, unspecified: Secondary | ICD-10-CM | POA: Diagnosis not present

## 2019-02-07 DIAGNOSIS — E118 Type 2 diabetes mellitus with unspecified complications: Secondary | ICD-10-CM | POA: Diagnosis not present

## 2019-02-07 DIAGNOSIS — I4510 Unspecified right bundle-branch block: Secondary | ICD-10-CM | POA: Diagnosis present

## 2019-02-07 DIAGNOSIS — F419 Anxiety disorder, unspecified: Secondary | ICD-10-CM | POA: Diagnosis present

## 2019-02-07 DIAGNOSIS — I10 Essential (primary) hypertension: Secondary | ICD-10-CM | POA: Diagnosis present

## 2019-02-07 DIAGNOSIS — R531 Weakness: Secondary | ICD-10-CM | POA: Diagnosis not present

## 2019-02-07 DIAGNOSIS — E1140 Type 2 diabetes mellitus with diabetic neuropathy, unspecified: Secondary | ICD-10-CM | POA: Diagnosis present

## 2019-02-07 DIAGNOSIS — M62838 Other muscle spasm: Secondary | ICD-10-CM

## 2019-02-07 DIAGNOSIS — I959 Hypotension, unspecified: Secondary | ICD-10-CM | POA: Diagnosis present

## 2019-02-07 DIAGNOSIS — I9589 Other hypotension: Secondary | ICD-10-CM | POA: Diagnosis not present

## 2019-02-07 HISTORY — DX: Cerebral infarction, unspecified: I63.9

## 2019-02-07 LAB — CBC WITH DIFFERENTIAL/PLATELET
Abs Immature Granulocytes: 0.03 10*3/uL (ref 0.00–0.07)
Basophils Absolute: 0.1 10*3/uL (ref 0.0–0.1)
Basophils Relative: 1 %
Eosinophils Absolute: 0.2 10*3/uL (ref 0.0–0.5)
Eosinophils Relative: 3 %
HCT: 42.8 % (ref 36.0–46.0)
Hemoglobin: 13.8 g/dL (ref 12.0–15.0)
Immature Granulocytes: 0 %
Lymphocytes Relative: 17 %
Lymphs Abs: 1.2 10*3/uL (ref 0.7–4.0)
MCH: 29.2 pg (ref 26.0–34.0)
MCHC: 32.2 g/dL (ref 30.0–36.0)
MCV: 90.5 fL (ref 80.0–100.0)
Monocytes Absolute: 0.6 10*3/uL (ref 0.1–1.0)
Monocytes Relative: 9 %
Neutro Abs: 5.3 10*3/uL (ref 1.7–7.7)
Neutrophils Relative %: 70 %
Platelets: 135 10*3/uL — ABNORMAL LOW (ref 150–400)
RBC: 4.73 MIL/uL (ref 3.87–5.11)
RDW: 12.5 % (ref 11.5–15.5)
WBC: 7.4 10*3/uL (ref 4.0–10.5)
nRBC: 0 % (ref 0.0–0.2)

## 2019-02-07 LAB — BLOOD GAS, VENOUS
Acid-base deficit: 0.1 mmol/L (ref 0.0–2.0)
Bicarbonate: 22.1 mmol/L (ref 20.0–28.0)
FIO2: 21
O2 Saturation: 32.5 %
Patient temperature: 36.5
pCO2, Ven: 49.1 mmHg (ref 44.0–60.0)
pH, Ven: 7.329 (ref 7.250–7.430)
pO2, Ven: <31 mmHg — CL (ref 32.0–45.0)

## 2019-02-07 LAB — COMPREHENSIVE METABOLIC PANEL
ALT: 25 U/L (ref 0–44)
AST: 27 U/L (ref 15–41)
Albumin: 3.4 g/dL — ABNORMAL LOW (ref 3.5–5.0)
Alkaline Phosphatase: 97 U/L (ref 38–126)
Anion gap: 11 (ref 5–15)
BUN: 34 mg/dL — ABNORMAL HIGH (ref 8–23)
CO2: 21 mmol/L — ABNORMAL LOW (ref 22–32)
Calcium: 9.1 mg/dL (ref 8.9–10.3)
Chloride: 96 mmol/L — ABNORMAL LOW (ref 98–111)
Creatinine, Ser: 1.48 mg/dL — ABNORMAL HIGH (ref 0.44–1.00)
GFR calc Af Amer: 37 mL/min — ABNORMAL LOW (ref >60)
GFR calc non Af Amer: 32 mL/min — ABNORMAL LOW (ref >60)
Glucose, Bld: 573 mg/dL (ref 70–99)
Potassium: 4.8 mmol/L (ref 3.5–5.1)
Sodium: 128 mmol/L — ABNORMAL LOW (ref 135–145)
Total Bilirubin: 0.9 mg/dL (ref 0.3–1.2)
Total Protein: 6.5 g/dL (ref 6.5–8.1)

## 2019-02-07 LAB — CBG MONITORING, ED
Glucose-Capillary: 487 mg/dL — ABNORMAL HIGH (ref 70–99)
Glucose-Capillary: 437 mg/dL — ABNORMAL HIGH (ref 70–99)
Glucose-Capillary: 349 mg/dL — ABNORMAL HIGH (ref 70–99)

## 2019-02-07 MED ORDER — HEPARIN SODIUM (PORCINE) 5000 UNIT/ML IJ SOLN
5000.0000 [IU] | Freq: Three times a day (TID) | INTRAMUSCULAR | Status: DC
  Administered 2019-02-08 – 2019-02-10 (×6): 5000 [IU] via SUBCUTANEOUS
  Filled 2019-02-07 (×6): qty 1

## 2019-02-07 MED ORDER — HYDRALAZINE HCL 20 MG/ML IJ SOLN: 5.0000 mg | INTRAMUSCULAR | Status: DC | PRN

## 2019-02-07 MED ORDER — VITAMIN D 25 MCG (1000 UNIT) PO TABS
5000.0000 [IU] | ORAL_TABLET | Freq: Every day | ORAL | Status: DC
  Administered 2019-02-08 – 2019-02-10 (×3): 5000 [IU] via ORAL
  Filled 2019-02-07 (×3): qty 5

## 2019-02-07 MED ORDER — ACETAMINOPHEN 325 MG PO TABS
650.0000 mg | ORAL_TABLET | Freq: Four times a day (QID) | ORAL | Status: DC | PRN
  Administered 2019-02-08: 650 mg via ORAL
  Filled 2019-02-07: qty 2

## 2019-02-07 MED ORDER — SODIUM CHLORIDE 0.9 % IV BOLUS
500.0000 mL | Freq: Once | INTRAVENOUS | Status: AC
  Administered 2019-02-07: 500 mL via INTRAVENOUS

## 2019-02-07 MED ORDER — GABAPENTIN 300 MG PO CAPS
600.0000 mg | ORAL_CAPSULE | Freq: Every day | ORAL | Status: DC
  Administered 2019-02-08 – 2019-02-09 (×2): 600 mg via ORAL
  Filled 2019-02-07 (×2): qty 2

## 2019-02-07 MED ORDER — SODIUM CHLORIDE 0.9 % IV BOLUS: 1000.0000 mL | Freq: Once | INTRAVENOUS | Status: DC

## 2019-02-07 MED ORDER — SODIUM CHLORIDE 0.9 % IV SOLN
INTRAVENOUS | Status: DC
  Administered 2019-02-07: 21:00:00 via INTRAVENOUS

## 2019-02-07 MED ORDER — TIZANIDINE HCL 2 MG PO TABS
2.0000 mg | ORAL_TABLET | Freq: Four times a day (QID) | ORAL | Status: DC | PRN
  Filled 2019-02-07: qty 1

## 2019-02-07 MED ORDER — TRAZODONE HCL 50 MG PO TABS
25.0000 mg | ORAL_TABLET | Freq: Every evening | ORAL | Status: DC | PRN
  Administered 2019-02-09: 25 mg via ORAL
  Filled 2019-02-07: qty 1

## 2019-02-07 MED ORDER — DEXTROSE-NACL 5-0.45 % IV SOLN
INTRAVENOUS | Status: DC
  Administered 2019-02-08: 02:00:00 via INTRAVENOUS

## 2019-02-07 MED ORDER — SIMVASTATIN 20 MG PO TABS
20.0000 mg | ORAL_TABLET | Freq: Every evening | ORAL | Status: DC
  Administered 2019-02-08 – 2019-02-09 (×2): 20 mg via ORAL
  Filled 2019-02-07 (×2): qty 1

## 2019-02-07 MED ORDER — INSULIN REGULAR(HUMAN) IN NACL 100-0.9 UT/100ML-% IV SOLN
INTRAVENOUS | Status: DC
  Administered 2019-02-07: 4.3 [IU]/h via INTRAVENOUS
  Filled 2019-02-07: qty 100

## 2019-02-07 MED ORDER — ACETAMINOPHEN 650 MG RE SUPP: 650.0000 mg | Freq: Four times a day (QID) | RECTAL | Status: DC | PRN

## 2019-02-07 MED ORDER — MIRABEGRON ER 25 MG PO TB24
50.0000 mg | ORAL_TABLET | Freq: Every evening | ORAL | Status: DC
  Administered 2019-02-08 – 2019-02-09 (×2): 50 mg via ORAL
  Filled 2019-02-07 (×3): qty 2

## 2019-02-07 MED ORDER — CLONAZEPAM 0.5 MG PO TABS
0.5000 mg | ORAL_TABLET | Freq: Every day | ORAL | Status: DC
  Administered 2019-02-08 – 2019-02-10 (×3): 0.5 mg via ORAL
  Filled 2019-02-07 (×3): qty 1

## 2019-02-07 MED ORDER — SODIUM CHLORIDE 0.9 % IV SOLN: INTRAVENOUS | Status: DC

## 2019-02-07 NOTE — ED Notes (Signed)
CRITICAL VALUE ALERT  Critical Value:  Glucose 573  Date & Time Notied:  1930 02/07/19  Provider Notified: [email or web address]  Orders Received/Actions taken: md notified

## 2019-02-07 NOTE — ED Triage Notes (Signed)
Pt reports difficulty holding items around lunch time today with either hand.  C/O generalized weakness.  CBG 590 with ems.  BP 66/40.  EMS attempted IV but was unable.  Pt alert and oriented.  Reports blood sugar has been elevated for the past 2 weeks.  PT says saw pcp last week for hyperglycemia but says he didn't change her insulin.

## 2019-02-07 NOTE — ED Provider Notes (Addendum)
[facility] EMERGENCY DEPARTMENT Provider Note   CSN: [id number] Arrival date & time: 02/07/19  1813     History   Chief Complaint Chief Complaint  Patient presents with  . Weakness    HPI [name] is a 85 y.o. female.  HPI Patient presents with generalized weakness.  Called EMS and found of a sugar of 600 and a pressure of 66/40.  States her sugars been running in the 400s.  Saw her PCP on Friday with today being Saturday and states did not change any medicines.  States she has some urinary frequency but not that much different than baseline.  States she felt a little dizzy.  EMS was unable to get an IV.  No fevers.  No cough.  No abdominal pain. Past Medical History:  Diagnosis Date  . Diabetes mellitus without complication ([place])   . Diabetic neuropathy ([place])   . Hypertension   . Urinary incontinence     Patient Active Problem List   Diagnosis Date Noted  . Slurred speech 11/17/2018  . Diabetic neuropathy ([place]) 11/17/2018  . AKI (acute kidney injury) ([place]) 11/17/2018  . Uncontrolled type 2 diabetes mellitus with hyperglycemia ([place]) 06/11/2018  . Hyperlipidemia 02/20/2016  . Essential hypertension, benign 10/10/2015  . Vitamin D deficiency 10/10/2015  . Class 1 obesity due to excess calories without serious comorbidity with body mass index (BMI) of 31.0 to 31.9 in adult 10/10/2015  . HIP PAIN 01/12/2009  . SPINAL STENOSIS 01/12/2009  . LOW BACK PAIN 01/12/2009  . SPONDYLOLYSIS 01/12/2009  . SHOULDER PAIN 10/04/2008  . Uncontrolled type 2 diabetes mellitus with complication, without long-term current use of insulin ([place]) 10/01/2008    Past Surgical History:  Procedure Laterality Date  . ABDOMINAL HYSTERECTOMY    . APPENDECTOMY    . BREAST SURGERY     begnin tumor removed  . CATARACT EXTRACTION, BILATERAL       OB History   No obstetric history on file.      Home Medications    Prior to Admission medications   Medication Sig Start Date End  Date Taking? Authorizing Provider  Cholecalciferol (VITAMIN D3) 5000 units CAPS Take 1 capsule (5,000 Units total) by mouth daily. Patient taking differently: Take 5,000 Units by mouth every evening.  04/15/17   Nida, Marella Chimes, MD  clonazePAM (KLONOPIN) 0.5 MG tablet Take 0.5 tablets (0.25 mg total) by mouth daily. Patient taking differently: Take 0.5 mg by mouth every evening.  10/07/18   Tat, Eustace Quail, DO  Cyanocobalamin (B-12 PO) Take 1 tablet by mouth every evening.    [provider]  furosemide (LASIX) 20 MG tablet Take 20 mg by mouth daily as needed (for fluid retention).     [provider]  gabapentin (NEURONTIN) 300 MG capsule Take 1 capsule (300 mg total) by mouth 3 (three) times daily. Patient taking differently: Take 600 mg by mouth at bedtime.  07/11/18   Cassandria Anger, MD  glucose blood (ONETOUCH VERIO) test strip Use as instructed tid. E11.65 10/02/18   Cassandria Anger, MD  Insulin Glargine (LANTUS SOLOSTAR) 100 UNIT/ML Solostar Pen Inject 23 Units into the skin 2 (two) times daily. 11/30/18   Barton Dubois, MD  Insulin Pen Needle (B-D ULTRAFINE III SHORT PEN) 31G X 8 MM MISC 1 each by Does not apply route as directed. 02/26/18   Cassandria Anger, MD  JANUVIA 50 MG tablet TAKE 1 TABLET BY MOUTH  DAILY Patient taking differently: Take 50  mg by mouth every evening.  05/01/18   Cassandria Anger, MD  lisinopril-hydrochlorothiazide (PRINZIDE,ZESTORETIC) 20-12.5 MG tablet Take 1 tablet by mouth 2 (two) times daily.  10/07/18   [provider]  metoprolol succinate (TOPROL-XL) 50 MG 24 hr tablet Take 50 mg by mouth at bedtime. Take with or immediately following a meal.     [provider]  MYRBETRIQ 50 MG TB24 tablet Take 50 mg by mouth every evening.  09/02/14   [provider]  nystatin (MYCOSTATIN/NYSTOP) powder Apply topically 2 (two) times daily. Apply to affected skin for treatment of fungal infection. Please keep  area clean and dry. 11/30/18   Barton Dubois, MD  simvastatin (ZOCOR) 20 MG tablet Take 20 mg by mouth every evening.    [provider]  tiZANidine (ZANAFLEX) 2 MG tablet Take by mouth every 6 (six) hours as needed for muscle spasms.    [provider]    Family History Family History  Problem Relation Age of Onset  . CAD Mother   . Hypertension Mother   . Stroke Father   . Heart attack Brother   . Cancer Brother     Social History Social History   Tobacco Use  . Smoking status: Never Smoker  . Smokeless tobacco: Never Used  Substance Use Topics  . Alcohol use: No  . Drug use: No     Allergies   Codeine   Review of Systems Review of Systems  Constitutional: Negative for appetite change.  HENT: Negative for congestion.   Respiratory: Negative for shortness of breath.   Cardiovascular: Negative for chest pain.  Gastrointestinal: Negative for abdominal pain.  Endocrine: Positive for polyuria.  Musculoskeletal: Negative for back pain.  Neurological: Positive for light-headedness.  Psychiatric/Behavioral: Negative for confusion.     Physical Exam Updated Vital Signs BP (!) 98/50 (BP Location: Right Arm)   Pulse 70   Temp 97.7 F (36.5 C) (Oral)   Resp 20   Ht 5\' 10"  (1.778 m)   Wt 92.5 kg   SpO2 94%   BMI 29.27 kg/m   Physical Exam Vitals signs and nursing note reviewed.  HENT:     Head: Normocephalic.     Mouth/Throat:     Mouth: Mucous membranes are dry.  Eyes:     General: No scleral icterus. Cardiovascular:     Rate and Rhythm: Normal rate.  Pulmonary:     Effort: Pulmonary effort is normal.     Breath sounds: No wheezing, rhonchi or rales.  Abdominal:     Tenderness: There is no abdominal tenderness.  Musculoskeletal:        General: No tenderness or signs of injury.  Skin:    General: Skin is warm.     Capillary Refill: Capillary refill takes less than 2 seconds.  Neurological:     Mental Status: She is alert. Mental  status is at baseline.      ED Treatments / Results  Labs (all labs ordered are listed, but only abnormal results are displayed) Labs Reviewed  BLOOD GAS, VENOUS - Abnormal; Notable for the following components:      Result Value   pO2, Ven <31.0 (*)    All other components within normal limits  COMPREHENSIVE METABOLIC PANEL - Abnormal; Notable for the following components:   Sodium 128 (*)    Chloride 96 (*)    CO2 21 (*)    Glucose, Bld 573 (*)    BUN 34 (*)  Creatinine, Ser 1.48 (*)    Albumin 3.4 (*)    GFR calc non Af Amer 32 (*)    GFR calc Af Amer 37 (*)    All other components within normal limits  CBC WITH DIFFERENTIAL/PLATELET - Abnormal; Notable for the following components:   Platelets 135 (*)    All other components within normal limits  URINALYSIS, ROUTINE W REFLEX MICROSCOPIC    EKG EKG Interpretation  Date/Time:  Saturday February 07 2019 18:40:07 EST Ventricular Rate:  68 PR Interval:    QRS Duration: 135 QT Interval:  433 QTC Calculation: 461 R Axis:   -81 Text Interpretation:  Sinus rhythm Right bundle branch block Confirmed by Davonna Belling [phone]) on 02/07/2019 6:46:00 PM   Radiology No results found.  Procedures Procedures (including critical care time)  Medications Ordered in ED Medications  dextrose 5 %-0.45 % sodium chloride infusion (has no administration in time range)  insulin regular, human (MYXREDLIN) 100 units/ 100 mL infusion (has no administration in time range)  sodium chloride 0.9 % bolus 1,000 mL (has no administration in time range)    And  0.9 %  sodium chloride infusion (has no administration in time range)  0.9 %  sodium chloride infusion (has no administration in time range)  sodium chloride 0.9 % bolus 500 mL (0 mLs Intravenous Stopped 02/07/19 1950)     Initial Impression / Assessment and Plan / ED Course  I have reviewed the triage vital signs and the nursing notes.  Pertinent labs & imaging results that  were available during my care of the patient were reviewed by me and considered in my medical decision making (see chart for details).     Patient presents with hypertension and hyperglycemia.  Sugars been higher for a while.  Sugar of over 500 here.  Not in DKA.  Creatinine near baseline.  Blood pressure went from 90 upon arrival is now better at around 120, however with the initial hypotension I feel patient benefit from admission to the hospital for further controlling of the sugar and potentially adjustment of medications.  Final Clinical Impressions(s) / ED Diagnoses   Final diagnoses:  Hyperglycemia  Hypotension, unspecified hypotension type    ED Discharge Orders    None       Davonna Belling, MD 02/07/19 3545    Davonna Belling, MD 03/19/19 [phone]

## 2019-02-07 NOTE — H&P (Signed)
History and Physical    [name] AVW:[id number] DOB: 06-26-1929 DOA: 02/07/2019  PCP: Sharilyn Sites, MD Patient coming from: home  Chief Complaint: hypotension  HPI: [name] is a 85 y.o. female with medical history significant of DM, Neuropathy, HTN, Incontinence.  PT presents w/ several day h/o weakness. States that on day prior to exam she kept dropping her utensils at [employer]. States there is no focal weakness, just generalized. Glucose has been running high despite recent chages to Lantus. 2 days ago pts pcp instructed her to increase her lantus from 43 to 60 units QHS. No other medication chagnes. Pt denies any other sx including CP, vision change, palpitaitons, fatigue, polydypsia, polyuria, abd pain, n/v, diaphoresis, HA, tremor. Pt self medicates. Pts daughter said that the other day she noticed that two of her medications that she was supposed to take were left in her pill bottle. Pt does not seem to remember this.    ED Course: started on glucose stabilizer. Objective findings outlined below.   Review of Systems: As per HPI otherwise all other systems reviewed and are negative  Ambulatory Status:no restrictions  Past Medical History:  Diagnosis Date  . Diabetes mellitus without complication ([place])   . Diabetic neuropathy ([place])   . Hypertension   . Urinary incontinence     Past Surgical History:  Procedure Laterality Date  . ABDOMINAL HYSTERECTOMY    . APPENDECTOMY    . BREAST SURGERY     begnin tumor removed  . CATARACT EXTRACTION, BILATERAL      Social History   Socioeconomic History  . Marital status: Married    Spouse name: Not on file  . Number of children: Not on file  . Years of education: Not on file  . Highest education level: Not on file  Occupational History  . Occupation: retired    Comment: [place] [place]  . Financial resource strain: Not on file  . Food insecurity:    Worry: Not on file   Inability: Not on file  . Transportation needs:    Medical: Not on file    Non-medical: Not on file  Tobacco Use  . Smoking status: Never Smoker  . Smokeless tobacco: Never Used  Substance and Sexual Activity  . Alcohol use: No  . Drug use: No  . Sexual activity: Not on file  Lifestyle  . Physical activity:    Days per week: Not on file    Minutes per session: Not on file  . Stress: Not on file  Relationships  . Social connections:    Talks on phone: Not on file    Gets together: Not on file    Attends religious service: Not on file    Active member of club or organization: Not on file    Attends meetings of clubs or organizations: Not on file    Relationship status: Not on file  . Intimate partner violence:    Fear of current or ex partner: Not on file    Emotionally abused: Not on file    Physically abused: Not on file    Forced sexual activity: Not on file  Other Topics Concern  . Not on file  Social History Narrative  . Not on file    Allergies  Allergen Reactions  . Codeine Nausea And Vomiting    Family History  Problem Relation Age of Onset  . CAD Mother   . Hypertension Mother   . Stroke Father   .  Heart attack Brother   . Cancer Brother       Prior to Admission medications   Medication Sig Start Date End Date Taking? Authorizing Provider  Cholecalciferol (VITAMIN D3) 5000 units CAPS Take 1 capsule (5,000 Units total) by mouth daily. Patient taking differently: Take 5,000 Units by mouth every evening.  04/15/17   Nida, Marella Chimes, MD  clonazePAM (KLONOPIN) 0.5 MG tablet Take 0.5 tablets (0.25 mg total) by mouth daily. Patient taking differently: Take 0.5 mg by mouth every evening.  10/07/18   Tat, Eustace Quail, DO  Cyanocobalamin (B-12 PO) Take 1 tablet by mouth every evening.    [provider]  furosemide (LASIX) 20 MG tablet Take 20 mg by mouth daily as needed (for fluid retention).     [provider]  gabapentin (NEURONTIN) 300  MG capsule Take 1 capsule (300 mg total) by mouth 3 (three) times daily. Patient taking differently: Take 600 mg by mouth at bedtime.  07/11/18   Cassandria Anger, MD  glucose blood (ONETOUCH VERIO) test strip Use as instructed tid. E11.65 10/02/18   Cassandria Anger, MD  Insulin Glargine (LANTUS SOLOSTAR) 100 UNIT/ML Solostar Pen Inject 23 Units into the skin 2 (two) times daily. 11/30/18   Barton Dubois, MD  Insulin Pen Needle (B-D ULTRAFINE III SHORT PEN) 31G X 8 MM MISC 1 each by Does not apply route as directed. 02/26/18   Cassandria Anger, MD  JANUVIA 50 MG tablet TAKE 1 TABLET BY MOUTH  DAILY Patient taking differently: Take 50 mg by mouth every evening.  05/01/18   Cassandria Anger, MD  lisinopril-hydrochlorothiazide (PRINZIDE,ZESTORETIC) 20-12.5 MG tablet Take 1 tablet by mouth 2 (two) times daily.  10/07/18   [provider]  metoprolol succinate (TOPROL-XL) 50 MG 24 hr tablet Take 50 mg by mouth at bedtime. Take with or immediately following a meal.     [provider]  MYRBETRIQ 50 MG TB24 tablet Take 50 mg by mouth every evening.  09/02/14   [provider]  nystatin (MYCOSTATIN/NYSTOP) powder Apply topically 2 (two) times daily. Apply to affected skin for treatment of fungal infection. Please keep area clean and dry. 11/30/18   Barton Dubois, MD  simvastatin (ZOCOR) 20 MG tablet Take 20 mg by mouth every evening.    [provider]  tiZANidine (ZANAFLEX) 2 MG tablet Take by mouth every 6 (six) hours as needed for muscle spasms.    [provider]    Physical Exam: Vitals:   02/07/19 1816 02/07/19 1818 02/07/19 2040 02/07/19 2041  BP:  (!) 98/50 120/76   Pulse:  70  (!) 51  Resp:  20 14 14   Temp:  97.7 F (36.5 C)    TempSrc:  Oral    SpO2:  94%  97%  Weight: 92.5 kg     Height: 5\' 10"  (1.778 m)        General:  Appears calm and comfortable Eyes:  PERRL, EOMI, normal lids, iris ENT:  grossly normal hearing, lips &  tongue, mmm Neck:  no LAD, masses or thyromegaly Cardiovascular:  RRR, no m/r/g. No LE edema.  Respiratory:  CTA bilaterally, no w/r/r. Normal respiratory effort. Abdomen:  soft, ntnd, NABS Skin:  no rash or induration seen on limited exam Musculoskeletal:  grossly normal tone BUE/BLE, good ROM, no bony abnormality Psychiatric:  grossly normal mood and affect, speech fluent and appropriate, AOx3 Neurologic:  CN 2-12 grossly intact, moves all extremities in coordinated fashion, sensation  intact  Labs on Admission: I have personally reviewed following labs and imaging studies  CBC: Recent Labs  Lab 02/07/19 1850  WBC 7.4  NEUTROABS 5.3  HGB 13.8  HCT 42.8  MCV 90.5  PLT 341*   Basic Metabolic Panel: Recent Labs  Lab 02/07/19 1850  NA 128*  K 4.8  CL 96*  CO2 21*  GLUCOSE 573*  BUN 34*  CREATININE 1.48*  CALCIUM 9.1   GFR: Estimated Creatinine Clearance: 33 mL/min (A) (by C-G formula based on SCr of 1.48 mg/dL (H)). Liver Function Tests: Recent Labs  Lab 02/07/19 1850  AST 27  ALT 25  ALKPHOS 97  BILITOT 0.9  PROT 6.5  ALBUMIN 3.4*   No results for input(s): LIPASE, AMYLASE in the last 168 hours. No results for input(s): AMMONIA in the last 168 hours. Coagulation Profile: No results for input(s): INR, PROTIME in the last 168 hours. Cardiac Enzymes: No results for input(s): CKTOTAL, CKMB, CKMBINDEX, TROPONINI in the last 168 hours. BNP (last 3 results) No results for input(s): PROBNP in the last 8760 hours. HbA1C: No results for input(s): HGBA1C in the last 72 hours. CBG: Recent Labs  Lab 02/07/19 2053  GLUCAP 487*   Lipid Profile: No results for input(s): CHOL, HDL, LDLCALC, TRIG, CHOLHDL, LDLDIRECT in the last 72 hours. Thyroid Function Tests: No results for input(s): TSH, T4TOTAL, FREET4, T3FREE, THYROIDAB in the last 72 hours. Anemia Panel: No results for input(s): VITAMINB12, FOLATE, FERRITIN, TIBC, IRON, RETICCTPCT in the last 72 hours. Urine  analysis:    Component Value Date/Time   COLORURINE YELLOW 11/30/2018 [place] 11/30/2018 0647   LABSPEC 1.018 11/30/2018 0647   PHURINE 5.0 11/30/2018 0647   GLUCOSEU >=500 (A) 11/30/2018 0647   HGBUR NEGATIVE 11/30/2018 [place] 11/30/2018 0647   KETONESUR NEGATIVE 11/30/2018 0647   PROTEINUR NEGATIVE 11/30/2018 0647   UROBILINOGEN 0.2 09/06/2009 0135   NITRITE NEGATIVE 11/30/2018 0647   LEUKOCYTESUR NEGATIVE 11/30/2018 0647    Creatinine Clearance: Estimated Creatinine Clearance: 33 mL/min (A) (by C-G formula based on SCr of 1.48 mg/dL (H)).  Sepsis Labs: @LABRCNTIP (procalcitonin:4,lacticidven:4) )No results found for this or any previous visit (from the past 240 hour(s)).   Radiological Exams on Admission: No results found.  EKG: Independently reviewed. Sinus , RBBB, No ACS  Assessment/Plan Active Problems:   Hypotension   Hyperglycemia: secondary to poorly controlled DM. Pt w/ questionable ability to administer home medications. Pt states that her sugar typically runs in the 200-300 range at home.  - Continue w/ Glucose stabilizer. Will take a more gradual approach to decreasing glucose due to the absense of acute process and likelihood of pt being very high for days to weeks.  - Hold Januvia - DM coordinator  Hypotension: likely multifactorial including autodiuresis from hyperglycemic state, questionable medication administration by pt, and cardiac abnormality (though pt w/o sx to suggest this). Echo in 2019 reviewed and nml - Hold home Lisinopril, HCTZ, and metop - Tele, Trop x1 - IVF  Renal insufficiency: Cr 1.4. Cr bounces around the 1.1-1.4 range - IVF and BMP in am  LE edema: at baseline. No reports of CHF and nml Echo - Hold Lasix due to hypotension - compression stockings.   Bladder spasm: - continue myrbetriq  HLD: - continue Zocor  MSK pain: - continue Zanaflex PRN  Anxiety: - continue Klonopin  DVT  prophylaxis: hep  Code Status: full  Family Communication: daughter  Disposition Plan: pending improvement in BP  and hyperglycemia  Consults called: DM coordinator  Admission status: Inpatient    Waldemar Dickens MD [facility]  If 7PM-7AM, please contact night-coverage [email or web address] Password [facility]  02/07/2019, 9:34 PM

## 2019-02-08 ENCOUNTER — Encounter (HOSPITAL_COMMUNITY): Payer: Self-pay

## 2019-02-08 ENCOUNTER — Other Ambulatory Visit: Payer: Self-pay

## 2019-02-08 LAB — CBG MONITORING, ED
Glucose-Capillary: 106 mg/dL — ABNORMAL HIGH (ref 70–99)
Glucose-Capillary: 114 mg/dL — ABNORMAL HIGH (ref 70–99)
Glucose-Capillary: 135 mg/dL — ABNORMAL HIGH (ref 70–99)
Glucose-Capillary: 146 mg/dL — ABNORMAL HIGH (ref 70–99)
Glucose-Capillary: 202 mg/dL — ABNORMAL HIGH (ref 70–99)
Glucose-Capillary: 214 mg/dL — ABNORMAL HIGH (ref 70–99)
Glucose-Capillary: 302 mg/dL — ABNORMAL HIGH (ref 70–99)
Glucose-Capillary: 322 mg/dL — ABNORMAL HIGH (ref 70–99)
Glucose-Capillary: 91 mg/dL (ref 70–99)

## 2019-02-08 LAB — BASIC METABOLIC PANEL
Anion gap: 12 (ref 5–15)
BUN: 29 mg/dL — ABNORMAL HIGH (ref 8–23)
CO2: 20 mmol/L — ABNORMAL LOW (ref 22–32)
Calcium: 9.6 mg/dL (ref 8.9–10.3)
Chloride: 104 mmol/L (ref 98–111)
Creatinine, Ser: 1.19 mg/dL — ABNORMAL HIGH (ref 0.44–1.00)
GFR calc Af Amer: 48 mL/min — ABNORMAL LOW (ref >60)
GFR calc non Af Amer: 41 mL/min — ABNORMAL LOW (ref >60)
Glucose, Bld: 180 mg/dL — ABNORMAL HIGH (ref 70–99)
Potassium: 4.2 mmol/L (ref 3.5–5.1)
Sodium: 136 mmol/L (ref 135–145)

## 2019-02-08 LAB — URINALYSIS, ROUTINE W REFLEX MICROSCOPIC
Bilirubin Urine: NEGATIVE
Glucose, UA: >=500 mg/dL — AB
Hgb urine dipstick: NEGATIVE
Ketones, ur: NEGATIVE mg/dL
Leukocytes,Ua: NEGATIVE
Nitrite: NEGATIVE
Protein, ur: NEGATIVE mg/dL
Specific Gravity, Urine: 1.021 (ref 1.005–1.030)
pH: 5 (ref 5.0–8.0)

## 2019-02-08 LAB — TROPONIN I: Troponin I: 0.04 ng/mL (ref <0.03)

## 2019-02-08 LAB — GLUCOSE, CAPILLARY
Glucose-Capillary: 255 mg/dL — ABNORMAL HIGH (ref 70–99)
Glucose-Capillary: 190 mg/dL — ABNORMAL HIGH (ref 70–99)

## 2019-02-08 MED ORDER — INSULIN ASPART 100 UNIT/ML ~~LOC~~ SOLN
0.0000 [IU] | Freq: Three times a day (TID) | SUBCUTANEOUS | Status: DC
  Administered 2019-02-08 – 2019-02-09 (×2): 11 [IU] via SUBCUTANEOUS
  Administered 2019-02-09: 7 [IU] via SUBCUTANEOUS
  Administered 2019-02-09: 11 [IU] via SUBCUTANEOUS
  Administered 2019-02-10 (×2): 4 [IU] via SUBCUTANEOUS

## 2019-02-08 MED ORDER — INSULIN ASPART 100 UNIT/ML ~~LOC~~ SOLN: 0.0000 [IU] | Freq: Every day | SUBCUTANEOUS | Status: DC

## 2019-02-08 MED ORDER — INSULIN ASPART 100 UNIT/ML ~~LOC~~ SOLN
0.0000 [IU] | Freq: Three times a day (TID) | SUBCUTANEOUS | Status: DC
  Administered 2019-02-08: 1 [IU] via SUBCUTANEOUS
  Filled 2019-02-08: qty 1

## 2019-02-08 MED ORDER — INSULIN GLARGINE 100 UNIT/ML ~~LOC~~ SOLN
6.0000 [IU] | Freq: Once | SUBCUTANEOUS | Status: DC
  Filled 2019-02-08: qty 0.06

## 2019-02-08 MED ORDER — INSULIN ASPART 100 UNIT/ML ~~LOC~~ SOLN
SUBCUTANEOUS | Status: AC
  Administered 2019-02-08: 6 [IU]
  Filled 2019-02-08: qty 1

## 2019-02-08 MED ORDER — INSULIN GLARGINE 100 UNIT/ML ~~LOC~~ SOLN
20.0000 [IU] | Freq: Two times a day (BID) | SUBCUTANEOUS | Status: DC
  Administered 2019-02-08 – 2019-02-09 (×2): 20 [IU] via SUBCUTANEOUS
  Filled 2019-02-08 (×4): qty 0.2

## 2019-02-08 MED ORDER — INSULIN GLARGINE 100 UNIT/ML ~~LOC~~ SOLN
SUBCUTANEOUS | Status: AC
  Filled 2019-02-08: qty 10

## 2019-02-08 MED ORDER — LINAGLIPTIN 5 MG PO TABS
5.0000 mg | ORAL_TABLET | Freq: Every day | ORAL | Status: DC
  Administered 2019-02-08 – 2019-02-10 (×3): 5 mg via ORAL
  Filled 2019-02-08 (×5): qty 1

## 2019-02-08 MED ORDER — INSULIN ASPART 100 UNIT/ML ~~LOC~~ SOLN
6.0000 [IU] | Freq: Three times a day (TID) | SUBCUTANEOUS | Status: DC
  Administered 2019-02-08 – 2019-02-10 (×3): 6 [IU] via SUBCUTANEOUS

## 2019-02-08 MED ORDER — INSULIN GLARGINE 100 UNIT/ML ~~LOC~~ SOLN
14.0000 [IU] | SUBCUTANEOUS | Status: DC
  Administered 2019-02-08: 14 [IU] via SUBCUTANEOUS
  Filled 2019-02-08 (×2): qty 0.14

## 2019-02-08 MED ORDER — INSULIN ASPART 100 UNIT/ML ~~LOC~~ SOLN
0.0000 [IU] | Freq: Every day | SUBCUTANEOUS | Status: DC
  Administered 2019-02-09: 3 [IU] via SUBCUTANEOUS

## 2019-02-08 NOTE — ED Notes (Signed)
Meal tray given 

## 2019-02-08 NOTE — Progress Notes (Signed)
Inpatient Diabetes Program Recommendations  AACE/ADA: New Consensus Statement on Inpatient Glycemic Control (2015)  Target Ranges:  Prepandial:   less than 140 mg/dL      Peak postprandial:   less than 180 mg/dL (1-2 hours)      Critically ill patients:  140 - 180 mg/dL   Lab Results  Component Value Date   GLUCAP 322 (H) 02/08/2019   HGBA1C 9.0 (H) 11/17/2018    Review of Glycemic Control  Diabetes history: DM2 Outpatient Diabetes medications: Lantus 25 units bid Current orders for Inpatient glycemic control: Lantus 20 units bid + Novolog 6 units tid meal coverage if eats 50% + Tradjenta 5 mg qd + Novolog resistant correction tid + hs  Inpatient Diabetes Program Recommendations:   Sent message to Dr. Manuella Ghazi to request additional 6 units Lantus to equal prescribed am dose and then give as scheduled starting tonight.  Thank you, Nani Gasser. Jaiyon Wander, RN, MSN, CDE  Diabetes Coordinator Inpatient Glycemic Control Team Team Pager [phone] (8am-5pm) 02/08/2019 2:23 PM

## 2019-02-08 NOTE — ED Notes (Signed)
CRITICAL VALUE ALERT  Critical Value:  Troponin 0.04  Date & Time Notied:  02/08/19 0517  Provider Notified: [email or web address]  Orders Received/Actions taken: md notified

## 2019-02-08 NOTE — Progress Notes (Signed)
PROGRESS NOTE    [name]  JJO:[id number] DOB: 1929-04-19 DOA: 02/07/2019 PCP: Sharilyn Sites, MD   Brief Narrative:  Per HPI: [name] is a 85 y.o. female with medical history significant of DM, Neuropathy, HTN, Incontinence.  PT presents w/ several day h/o weakness. States that on day prior to exam she kept dropping her utensils at [employer]. States there is no focal weakness, just generalized. Glucose has been running high despite recent chages to Lantus. 2 days ago pts pcp instructed her to increase her lantus from 43 to 60 units QHS. No other medication chagnes. Pt denies any other sx including CP, vision change, palpitaitons, fatigue, polydypsia, polyuria, abd pain, n/v, diaphoresis, HA, tremor. Pt self medicates. Pts daughter said that the other day she noticed that two of her medications that she was supposed to take were left in her pill bottle. Pt does not seem to remember this.    Assessment & Plan:   Active Problems:   Hypotension  Hyperglycemia: secondary to poorly controlled DM. Pt w/ questionable ability to administer home medications. Pt states that her sugar typically runs in the 200-300 range at home.  - Patient off glucose stabilizer now and BG elevating. Will place on aggressive SSI and BID Lantus along with meal time insulin. Januvia restarted. - DM coordinator pending -A1c pending   Hypotension-resolved: likely multifactorial including autodiuresis from hyperglycemic state, questionable medication administration by pt, and cardiac abnormality (though pt w/o sx to suggest this). Echo in 2019 reviewed and nml - Hold home Lisinopril, HCTZ, and metoprolol for now  Renal insufficiency: Cr 1.4. Cr bounces around the 1.1-1.4 range - IVF held for now and BMP in am  LE edema: at baseline. No reports of CHF and nml Echo - Hold Lasix due to hypotension - compression stockings.   Bladder spasm: - continue myrbetriq  HLD: - continue  Zocor  MSK pain: - continue Zanaflex PRN  Anxiety: - continue Klonopin    DVT prophylaxis: Heparin Code Status: Full Family Communication: Husband and grandson at bedside Disposition Plan: BG control and PT evaluation with possible need for placement   Consultants:   None  Procedures:   None  Antimicrobials:   None   Subjective: Patient seen and evaluated today with no new acute complaints or concerns. No acute concerns or events noted overnight. She continues to feel very weak and will not cooperate with orthostatics or ambulation. Blood glucose control is labile.  Objective: Vitals:   02/08/19 0835 02/08/19 0930 02/08/19 1000 02/08/19 1100  BP: (!) 159/65 107/83 117/81 107/82  Pulse: 87 87 95 (!) 105  Resp: 20 18 18 18   Temp:      TempSrc:      SpO2: 98% 95% 97% 96%  Weight:      Height:        Intake/Output Summary (Last 24 hours) at 02/08/2019 1324 Last data filed at 02/08/2019 1601 Gross per 24 hour  Intake 462.44 ml  Output -  Net 462.44 ml   Filed Weights   02/07/19 1816  Weight: 92.5 kg    Examination:  General exam: Appears calm and comfortable  Respiratory system: Clear to auscultation. Respiratory effort normal. Cardiovascular system: S1 & S2 heard, RRR. No JVD, murmurs, rubs, gallops or clicks. No pedal edema. Gastrointestinal system: Abdomen is nondistended, soft and nontender. No organomegaly or masses felt. Normal bowel sounds heard. Central nervous system: Alert and oriented. No focal neurological deficits. Extremities: Symmetric 5 x 5 power. Skin:  No rashes, lesions or ulcers Psychiatry: Judgement and insight appear normal. Mood & affect appropriate.     Data Reviewed: I have personally reviewed following labs and imaging studies  CBC: Recent Labs  Lab 02/07/19 1850  WBC 7.4  NEUTROABS 5.3  HGB 13.8  HCT 42.8  MCV 90.5  PLT 941*   Basic Metabolic Panel: Recent Labs  Lab 02/07/19 1850 02/08/19 0419  NA 128* 136    K 4.8 4.2  CL 96* 104  CO2 21* 20*  GLUCOSE 573* 180*  BUN 34* 29*  CREATININE 1.48* 1.19*  CALCIUM 9.1 9.6   GFR: Estimated Creatinine Clearance: 41.1 mL/min (A) (by C-G formula based on SCr of 1.19 mg/dL (H)). Liver Function Tests: Recent Labs  Lab 02/07/19 1850  AST 27  ALT 25  ALKPHOS 97  BILITOT 0.9  PROT 6.5  ALBUMIN 3.4*   No results for input(s): LIPASE, AMYLASE in the last 168 hours. No results for input(s): AMMONIA in the last 168 hours. Coagulation Profile: No results for input(s): INR, PROTIME in the last 168 hours. Cardiac Enzymes: Recent Labs  Lab 02/08/19 0419  TROPONINI 0.04*   BNP (last 3 results) No results for input(s): PROBNP in the last 8760 hours. HbA1C: No results for input(s): HGBA1C in the last 72 hours. CBG: Recent Labs  Lab 02/08/19 0435 02/08/19 0536 02/08/19 0635 02/08/19 0759 02/08/19 1223  GLUCAP 91 146* 114* 135* 322*   Lipid Profile: No results for input(s): CHOL, HDL, LDLCALC, TRIG, CHOLHDL, LDLDIRECT in the last 72 hours. Thyroid Function Tests: No results for input(s): TSH, T4TOTAL, FREET4, T3FREE, THYROIDAB in the last 72 hours. Anemia Panel: No results for input(s): VITAMINB12, FOLATE, FERRITIN, TIBC, IRON, RETICCTPCT in the last 72 hours. Sepsis Labs: No results for input(s): PROCALCITON, LATICACIDVEN in the last 168 hours.  No results found for this or any previous visit (from the past 240 hour(s)).       Radiology Studies: No results found.      Scheduled Meds: . cholecalciferol  5,000 Units Oral Daily  . clonazePAM  0.5 mg Oral Daily  . gabapentin  600 mg Oral QHS  . heparin  5,000 Units Subcutaneous Q8H  . insulin aspart  0-20 Units Subcutaneous TID WC  . insulin aspart  0-5 Units Subcutaneous QHS  . insulin aspart  0-5 Units Subcutaneous QHS  . insulin aspart  6 Units Subcutaneous TID WC  . insulin glargine  20 Units Subcutaneous BID  . linagliptin  5 mg Oral Daily  . mirabegron ER  50 mg Oral  QPM  . simvastatin  20 mg Oral QPM   Continuous Infusions: . sodium chloride       LOS: 1 day    Time spent: 30 minutes    Bernabe Dorce Darleen Crocker, DO [facility] Pager [phone]  If 7PM-7AM, please contact night-coverage [email or web address] Password [email or web address] 02/08/2019, 1:24 PM

## 2019-02-08 NOTE — ED Notes (Signed)
Attempted to do ortho vs.   Was able to record pt lying and sitting. Refused to stand, citing "she knows herself and how she feels".   Pt shaking her hands and saying "if I get some sleep, I can try to stand".   Pt also says she does not walk. Per son and daughter, she uses a w/c at home. Only does a stand/pivot for transfer.

## 2019-02-08 NOTE — ED Notes (Signed)
PT stating she is too weak to stand and scared she will fall. PT's daughter at bedside at this time and states pt no longer walks at home but uses a wheelchair and just transfers herself. PT had homehealth therapy in the past and requesting therapy order for nursing and physcial therapy at discharge.

## 2019-02-09 LAB — GLUCOSE, CAPILLARY
Glucose-Capillary: 270 mg/dL — ABNORMAL HIGH (ref 70–99)
Glucose-Capillary: 282 mg/dL — ABNORMAL HIGH (ref 70–99)
Glucose-Capillary: 203 mg/dL — ABNORMAL HIGH (ref 70–99)
Glucose-Capillary: 251 mg/dL — ABNORMAL HIGH (ref 70–99)

## 2019-02-09 LAB — HEMOGLOBIN A1C
Hgb A1c MFr Bld: 14 % — ABNORMAL HIGH (ref 4.8–5.6)
Mean Plasma Glucose: 355 mg/dL

## 2019-02-09 LAB — BASIC METABOLIC PANEL
Anion gap: 10 (ref 5–15)
BUN: 19 mg/dL (ref 8–23)
CO2: 21 mmol/L — ABNORMAL LOW (ref 22–32)
Calcium: 9.3 mg/dL (ref 8.9–10.3)
Chloride: 106 mmol/L (ref 98–111)
Creatinine, Ser: 1.06 mg/dL — ABNORMAL HIGH (ref 0.44–1.00)
GFR calc Af Amer: 55 mL/min — ABNORMAL LOW (ref >60)
GFR calc non Af Amer: 47 mL/min — ABNORMAL LOW (ref >60)
Glucose, Bld: 219 mg/dL — ABNORMAL HIGH (ref 70–99)
Potassium: 3.9 mmol/L (ref 3.5–5.1)
Sodium: 137 mmol/L (ref 135–145)

## 2019-02-09 MED ORDER — INSULIN GLARGINE 100 UNIT/ML ~~LOC~~ SOLN
25.0000 [IU] | Freq: Two times a day (BID) | SUBCUTANEOUS | Status: DC
  Administered 2019-02-09 – 2019-02-10 (×2): 25 [IU] via SUBCUTANEOUS
  Filled 2019-02-09 (×4): qty 0.25

## 2019-02-09 MED ORDER — LISINOPRIL 10 MG PO TABS: 20.0000 mg | ORAL_TABLET | Freq: Every day | ORAL | Status: DC

## 2019-02-09 MED ORDER — METOPROLOL SUCCINATE ER 50 MG PO TB24
50.0000 mg | ORAL_TABLET | Freq: Every day | ORAL | Status: DC
  Administered 2019-02-09: 50 mg via ORAL
  Filled 2019-02-09: qty 1

## 2019-02-09 NOTE — Care Management Important Message (Signed)
Important Message  Patient Details  Name: [name] MRN: [id number] Date of Birth: 05-Nov-1929   Medicare Important Message Given:  Yes    Tommy Medal 02/09/2019, 4:13 PM

## 2019-02-09 NOTE — Evaluation (Signed)
Physical Therapy Evaluation Patient Details Name: [name] MRN: [id number] DOB: 12/09/1929 Today's Date: 02/09/2019   History of Present Illness  [name] is a 85 y.o. female with medical history significant of DM, Neuropathy, HTN, Incontinence. PT presents w/ several day h/o weakness. States that on day prior to exam she kept dropping her utensils at [employer]. States there is no focal weakness, just generalized. Glucose has been running high despite recent chages to Lantus. 2 days ago pts pcp instructed her to increase her lantus from 43 to 60 units QHS. No other medication chagnes. Pt denies any other sx including CP, vision change, palpitaitons, fatigue, polydypsia, polyuria, abd pain, n/v, diaphoresis, HA, tremor. Pt self medicates. Pts daughter said that the other day she noticed that two of her medications that she was supposed to take were left in her pill bottle. Pt does not seem to remember this.   Clinical Impression   Patient presents supine in bed and is agreeable to therapy. Patient below baseline for functional mobility and gait, limited secondary to generalized weakness and fatigue with activity with poor standing balance. Pt requires multiple attempts to rise from EOB using RW, able to complete with mod assist and rocking momentum to rise. Pt limited to 4-5 labored, unsteady steps at bedside for transferring to bedside chair, limited secondary to weakness and fatigue. No loss of balance with transfers or gait training, but pt with limited standing tolerance and requires assistance to remain upright and prevent loss of balance due to B LE weakness. Pt tolerates remaining up in chair after therapy session and reports wanting to return home with HHPT because it just ended and she has exercises she is able to complete on her own despite education on SNF benefits. Patient left up in chair with bil LE elevated and with call bell in reach. Patient will benefit from  continued physical therapy in hospital and recommended venue below to increase strength, balance, endurance for safe ADLs and gait.     Follow Up Recommendations SNF;Supervision/Assistance - 24 hour    Equipment Recommendations  None recommended by PT    Recommendations for Other Services       Precautions / Restrictions Precautions Precautions: Fall Restrictions Weight Bearing Restrictions: No      Mobility  Bed Mobility Overal bed mobility: Needs Assistance Bed Mobility: Supine to Sit     Supine to sit: Min assist     General bed mobility comments: increased time, use of bed rail, assistance with trunk uprighting and scooting to EOB  Transfers Overall transfer level: Needs assistance Equipment used: Rolling walker (2 wheeled) Transfers: Sit to/from [facility] Sit to Stand: Mod assist Stand pivot transfers: Mod assist       General transfer comment: increased time, multiple attempts to rise from EOB, unsteadiness upon standing  Ambulation/Gait Ambulation/Gait assistance: Mod assist Gait Distance (Feet): 4 Feet Assistive device: Rolling walker (2 wheeled) Gait Pattern/deviations: Decreased step length - right;Decreased step length - left;Decreased stride length Gait velocity: decreased   General Gait Details: labored, unsteady 4-5 steps at bedside, mild shortness of breath, no foot drop noted, shuffling step progression with increased lateral weight-shifting, no loss of balance  Stairs            Wheelchair Mobility    Modified Rankin (Stroke Patients Only)       Balance Overall balance assessment: Needs assistance Sitting-balance support: Feet supported;Bilateral upper extremity supported Sitting balance-Leahy Scale: Fair Sitting balance - Comments: seated  edge of bed   Standing balance support: During functional activity;Bilateral upper extremity supported Standing balance-Leahy Scale: Poor Standing balance comment: with RW,  limited standing tolerance and min assist to prevent loss of balance                             Pertinent Vitals/Pain Pain Assessment: No/denies pain    Home Living Family/patient expects to be discharged to:: Private residence Living Arrangements: Spouse/significant other;Children(Grandson lives in backyard apartment) Available Help at Discharge: Family;Friend(s) Type of Home: House Home Access: Stairs to enter Entrance Stairs-Rails: None Entrance Stairs-Number of Steps: 1 Home Layout: One level Home Equipment: Environmental consultant - 2 wheels;Cane - single point;Wheelchair - manual Additional Comments: Pt reports her and her husband are about to move into a 2 story home, bedroom/bath on main level, getting a ramped entrance, and a chair lift for staircase    Prior Function Level of Independence: Needs assistance   Gait / Transfers Assistance Needed: Pt reports she ambualtes short household distances using RW or SPC, ascends/descends 1 step to enter home with hand held assist, and uses manual w/c primarily requiring occasional propulsion assistance  ADL's / Homemaking Assistance Needed: Pt reports she is Ind with all ADLs, spouse assists with cooking, cleaning, and driving        Hand Dominance   Dominant Hand: Right    Extremity/Trunk Assessment   Upper Extremity Assessment Upper Extremity Assessment: Generalized weakness    Lower Extremity Assessment Lower Extremity Assessment: Generalized weakness;RLE deficits/detail;LLE deficits/detail RLE Deficits / Details: grossly 3/5 RLE Sensation: WNL RLE Coordination: WNL LLE Deficits / Details: grossly 3+/5 LLE Sensation: WNL LLE Coordination: WNL       Communication   Communication: No difficulties  Cognition Arousal/Alertness: Awake/alert Behavior During Therapy: WFL for tasks assessed/performed Overall Cognitive Status: Within Functional Limits for tasks assessed                                         General Comments      Exercises     Assessment/Plan    PT Assessment Patient needs continued PT services  PT Problem List Decreased strength;Decreased activity tolerance;Decreased balance;Decreased mobility       PT Treatment Interventions Gait training;Functional mobility training;Therapeutic activities;Therapeutic exercise;Balance training;Patient/family education    PT Goals (Current goals can be found in the Care Plan section)  Acute Rehab PT Goals Patient Stated Goal: return home with spouse PT Goal Formulation: With patient Time For Goal Achievement: 02/23/19 Potential to Achieve Goals: Good    Frequency Min 3X/week   Barriers to discharge        Co-evaluation               AM-PAC PT "6 Clicks" Mobility  Outcome Measure Help needed turning from your back to your side while in a flat bed without using bedrails?: A Little Help needed moving from lying on your back to sitting on the side of a flat bed without using bedrails?: A Little Help needed moving to and from a bed to a chair (including a wheelchair)?: A Lot Help needed standing up from a chair using your arms (e.g., wheelchair or bedside chair)?: A Lot Help needed to walk in hospital room?: A Lot Help needed climbing 3-5 steps with a railing? : Total 6 Click Score: 13    End of Session  Equipment Utilized During Treatment: Gait belt Activity Tolerance: Patient tolerated treatment well;Patient limited by fatigue Patient left: in chair;with call bell/phone within reach Nurse Communication: Mobility status PT Visit Diagnosis: Unsteadiness on feet (R26.81);Other abnormalities of gait and mobility (R26.89);Muscle weakness (generalized) (M62.81)    Time: 0757-3225 PT Time Calculation (min) (ACUTE ONLY): 29 min   Charges:   PT Evaluation $PT Eval Moderate Complexity: 1 Mod PT Treatments $Gait Training: 8-22 mins $Therapeutic Activity: 8-22 mins       1:09 PM, 02/09/19 Talbot Grumbling,  DPT Physical Therapist with [facility] [phone] office

## 2019-02-09 NOTE — Progress Notes (Signed)
PROGRESS NOTE    FLORESTINE CARMICAL  DXA:[id number] DOB: [birth year] DOA: 02/07/2019 PCP: Sharilyn Sites, MD   Brief Narrative:  Per HPI: [name] Gavel Harrelsonis a 85 y.o.femalewith medical history significant ofDM, Neuropathy, HTN, Incontinence.  PT presents w/ several day h/o weakness. States that on day prior to exam she kept dropping her utensils at [employer]. States there is no focal weakness, just generalized. Glucose has been running high despite recent chages to Lantus. 2 days ago pts pcp instructed her to increase her lantus from 43 to 60 units QHS. No other medication chagnes. Pt denies any other sx including CP, vision change, palpitaitons, fatigue, polydypsia, polyuria, abd pain, n/v, diaphoresis, HA, tremor. Pt self medicates. Pts daughter said that the other day she noticed that two of her medications that she was supposed to take were left in her pill bottle. Pt does not seem to remember this.  Assessment & Plan:   Active Problems:   Hypotension  Hyperglycemia: secondary to poorly controlled DM-now improved -Pt w/ questionable ability to administer home medications. Pt states that her sugar typically runs in the 200-300 range at home.  - Patient off glucose stabilizer now and BG elevating. Will place on aggressive SSI and BID Lantus along with meal time insulin. Januvia restarted. - DM coordinator recommends Lantus 25U BID which has been started -A1c 14%   Hypotension-resolved: likely multifactorial including autodiuresis from hyperglycemic state, questionable medication administration by pt, and cardiac abnormality (though pt w/o sx to suggest this). Echo in 2019 reviewed and nml - Restart metoprolol and lisinopril; hold hctz for now  Renal insufficiency: Cr bounces around the 1.1-1.4 range - IVF held for now; Cr improved  LE edema: at baseline. No reports of CHF and nml Echo - Hold Lasix due to hypotension - compression stockings.   Bladder spasm: -  continue myrbetriq  HLD: - continue Zocor  MSK pain: - continue Zanaflex PRN  Anxiety: - continue Klonopin    DVT prophylaxis: Heparin Code Status: Full Family Communication: None at bedside Disposition Plan: SNF placement   Consultants:   None  Procedures:   None  Antimicrobials:   None  Subjective: Patient seen and evaluated today with no new acute complaints or concerns. No acute concerns or events noted overnight. She is feeling better this am, but is still quite weak.  Objective: Vitals:   02/08/19 1500 02/08/19 1523 02/08/19 2126 02/09/19 0551  BP: 122/90 122/90 (!) 120/106 (!) 141/79  Pulse: (!) 103 (!) 103 (!) 102 94  Resp: 16  16 18   Temp: 98.9 F (37.2 C) 98.9 F (37.2 C) 98.4 F (36.9 C) 98.4 F (36.9 C)  TempSrc:  Oral Oral Oral  SpO2: 98% 98% 96% 97%  Weight: 93.4 kg 93.4 kg    Height: 5\' 8"  (1.727 m) 5\' 8"  (1.727 m)      Intake/Output Summary (Last 24 hours) at 02/09/2019 1327 Last data filed at 02/09/2019 0500 Gross per 24 hour  Intake 162.89 ml  Output 600 ml  Net -437.11 ml   Filed Weights   02/07/19 1816 02/08/19 1500 02/08/19 1523  Weight: 92.5 kg 93.4 kg 93.4 kg    Examination:  General exam: Appears calm and comfortable  Respiratory system: Clear to auscultation. Respiratory effort normal. Cardiovascular system: S1 & S2 heard, RRR. No JVD, murmurs, rubs, gallops or clicks. No pedal edema. Gastrointestinal system: Abdomen is nondistended, soft and nontender. No organomegaly or masses felt. Normal bowel sounds heard. Central nervous system: Alert and oriented.  No focal neurological deficits. Extremities: Symmetric 5 x 5 power. Skin: No rashes, lesions or ulcers Psychiatry: Judgement and insight appear normal. Mood & affect appropriate.     Data Reviewed: I have personally reviewed following labs and imaging studies  CBC: Recent Labs  Lab 02/07/19 1850  WBC 7.4  NEUTROABS 5.3  HGB 13.8  HCT 42.8  MCV 90.5    PLT 161*   Basic Metabolic Panel: Recent Labs  Lab 02/07/19 1850 02/08/19 0419 02/09/19 0531  NA 128* 136 137  K 4.8 4.2 3.9  CL 96* 104 106  CO2 21* 20* 21*  GLUCOSE 573* 180* 219*  BUN 34* 29* 19  CREATININE 1.48* 1.19* 1.06*  CALCIUM 9.1 9.6 9.3   GFR: Estimated Creatinine Clearance: 44.7 mL/min (A) (by C-G formula based on SCr of 1.06 mg/dL (H)). Liver Function Tests: Recent Labs  Lab 02/07/19 1850  AST 27  ALT 25  ALKPHOS 97  BILITOT 0.9  PROT 6.5  ALBUMIN 3.4*   No results for input(s): LIPASE, AMYLASE in the last 168 hours. No results for input(s): AMMONIA in the last 168 hours. Coagulation Profile: No results for input(s): INR, PROTIME in the last 168 hours. Cardiac Enzymes: Recent Labs  Lab 02/08/19 0419  TROPONINI 0.04*   BNP (last 3 results) No results for input(s): PROBNP in the last 8760 hours. HbA1C: Recent Labs    02/08/19 0419  HGBA1C 14.0*   CBG: Recent Labs  Lab 02/08/19 1223 02/08/19 1630 02/08/19 2126 02/09/19 0835 02/09/19 1117  GLUCAP 322* 255* 190* 270* 282*   Lipid Profile: No results for input(s): CHOL, HDL, LDLCALC, TRIG, CHOLHDL, LDLDIRECT in the last 72 hours. Thyroid Function Tests: No results for input(s): TSH, T4TOTAL, FREET4, T3FREE, THYROIDAB in the last 72 hours. Anemia Panel: No results for input(s): VITAMINB12, FOLATE, FERRITIN, TIBC, IRON, RETICCTPCT in the last 72 hours. Sepsis Labs: No results for input(s): PROCALCITON, LATICACIDVEN in the last 168 hours.  No results found for this or any previous visit (from the past 240 hour(s)).       Radiology Studies: No results found.      Scheduled Meds: . cholecalciferol  5,000 Units Oral Daily  . clonazePAM  0.5 mg Oral Daily  . gabapentin  600 mg Oral QHS  . heparin  5,000 Units Subcutaneous Q8H  . insulin aspart  0-20 Units Subcutaneous TID WC  . insulin aspart  0-5 Units Subcutaneous QHS  . insulin aspart  6 Units Subcutaneous TID WC  . insulin  glargine  20 Units Subcutaneous BID  . insulin glargine  6 Units Subcutaneous Once  . linagliptin  5 mg Oral Daily  . mirabegron ER  50 mg Oral QPM  . simvastatin  20 mg Oral QPM   Continuous Infusions: . sodium chloride       LOS: 83 days    Time spent: 30 minutes    Jair Lindblad Darleen Crocker, DO [facility] Pager [phone]  If 7PM-7AM, please contact night-coverage [email or web address] Password [facility] 02/09/2019, 1:27 PM

## 2019-02-09 NOTE — Plan of Care (Signed)
  Problem: Acute Rehab PT Goals(only PT should resolve) Goal: Pt Will Go Supine/Side To Sit Outcome: Progressing Flowsheets (Taken 02/09/2019 1309) Pt will go Supine/Side to Sit: with min guard assist Goal: Patient Will Transfer Sit To/From Stand Outcome: Progressing Flowsheets (Taken 02/09/2019 1309) Patient will transfer sit to/from stand: with min guard assist Note:  With RW Goal: Pt Will Transfer Bed To Chair/Chair To Bed Outcome: Progressing Flowsheets (Taken 02/09/2019 1309) Pt will Transfer Bed to Chair/Chair to Bed: min guard assist Note:  With RW Goal: Pt Will Ambulate Outcome: Progressing Flowsheets (Taken 02/09/2019 1309) Pt will Ambulate: 15 feet; with minimal assist; with rolling walker   1:10 PM, 02/09/19 Talbot Grumbling, DPT Physical Therapist with [facility] [phone] office

## 2019-02-09 NOTE — Progress Notes (Signed)
Inpatient Diabetes Program Recommendations  AACE/ADA: New Consensus Statement on Inpatient Glycemic Control (2015)  Target Ranges:  Prepandial:   less than 140 mg/dL      Peak postprandial:   less than 180 mg/dL (1-2 hours)      Critically ill patients:  140 - 180 mg/dL   Lab Results  Component Value Date   GLUCAP 270 (H) 02/09/2019   HGBA1C 9.0 (H) 11/17/2018    Review of Glycemic Control Results for [name], [name] (MRN [id number]) as of 02/09/2019 08:56  Ref. Range 02/08/2019 07:59 02/08/2019 12:23 02/08/2019 16:30 02/08/2019 21:26 02/09/2019 08:35  Glucose-Capillary Latest Ref Range: 70 - 99 mg/dL 135 (H) 322 (H) 255 (H) 190 (H) 270 (H)   Diabetes history: DM2 Outpatient Diabetes medications: Lantus 25 units bid Current orders for Inpatient glycemic control: Lantus 20 units bid + Novolog 6 units tid meal coverage if eats 50% + Tradjenta 5 mg qd + Novolog resistant correction tid + hs  Inpatient Diabetes Program Recommendations:   Noted additional Lantus 6 units ordered yesterday was not given. -Increase Lantus to home dose of Lantus 25 units bid  Thank you, Nani Gasser. Donzel Romack, RN, MSN, CDE  Diabetes Coordinator Inpatient Glycemic Control Team Team Pager [phone] (8am-5pm) 02/09/2019 8:56 AM

## 2019-02-10 LAB — GLUCOSE, CAPILLARY
Glucose-Capillary: 174 mg/dL — ABNORMAL HIGH (ref 70–99)
Glucose-Capillary: 190 mg/dL — ABNORMAL HIGH (ref 70–99)

## 2019-02-10 MED ORDER — INSULIN GLARGINE 100 UNIT/ML ~~LOC~~ SOLN: 25.0000 [IU] | Freq: Two times a day (BID) | SUBCUTANEOUS | 11 refills | Status: DC

## 2019-02-10 MED ORDER — GABAPENTIN 300 MG PO CAPS: 600.0000 mg | ORAL_CAPSULE | Freq: Every day | ORAL | 0 refills | Status: DC

## 2019-02-10 NOTE — Discharge Summary (Signed)
Physician Discharge Summary  [name] UXN:[id number] DOB: October 15, 1929 DOA: 02/07/2019  PCP: Sharilyn Sites, MD  Admit date: 02/07/2019  Discharge date: 02/10/2019  Admitted From:Home  Disposition:  Home  Recommendations for Outpatient Follow-up:  1. Follow up with PCP in 1-2 weeks 2. Please recheck blood pressure and blood glucose in office in 1 week and ensure stability 3. Hold home lisinopril/HCTZ for now due to soft blood pressure readings 4. Lantus dose adjusted to 25 units twice daily which patient has been tolerating better 5. Refuses rehab and would like to go home with PT  Home Health: Yes, with PT and RN  Equipment/Devices: Has walker at home  Discharge Condition: Stable  CODE STATUS: Full  Diet recommendation: Heart Healthy/carb modified  Brief/Interim Summary: Per HPI: [name] Schreiner Harrelsonis a 85 y.o.femalewith medical history significant ofDM, Neuropathy, HTN, Incontinence.  PT presents w/ several day h/o weakness. States that on day prior to exam she kept dropping her utensils at [employer]. States there is no focal weakness, just generalized. Glucose has been running high despite recent chages to Lantus. 2 days ago pts pcp instructed her to increase her lantus from 43 to 60 units QHS. No other medication chagnes. Pt denies any other sx including CP, vision change, palpitaitons, fatigue, polydypsia, polyuria, abd pain, n/v, diaphoresis, HA, tremor. Pt self medicates. Pts daughter said that the other day she noticed that two of her medications that she was supposed to take were left in her pill bottle. Pt does not seem to remember this.  Patient was admitted with hyperglycemia secondary to poorly controlled diabetes as well as soft blood pressure readings.  She was initially placed on insulin drip, but is now doing much better.  Hemoglobin A1c noted to be 83%  She has been recommended to remain on Lantus 25 units twice daily which has been working quite  well for her.  She has had no other acute events during the course of the stay and was evaluated by physical therapy on account of weakness and frequent falls at home.  PT did recommend placement to rehabilitation/SNF, but patient refuses placement at this time and would like to go home understanding the risks of fall.  She does have walker at home.  She will need to follow-up with her PCP in the next 1 week to ensure that blood pressure and blood glucose remained stable.  Discharge Diagnoses:  Active Problems:   Hypotension  Principal discharge diagnosis: Hyperglycemia secondary to poorly controlled type 2 diabetes.  Discharge Instructions  Discharge Instructions    Diet - low sodium heart healthy   Complete by:  As directed    Increase activity slowly   Complete by:  As directed      Allergies as of 02/10/2019      Reactions   Codeine Nausea And Vomiting      Medication List    STOP taking these medications   Insulin Glargine 100 UNIT/ML Solostar Pen Commonly known as:  LANTUS SOLOSTAR Replaced by:  insulin glargine 100 UNIT/ML injection   lisinopril-hydrochlorothiazide 20-12.5 MG tablet Commonly known as:  PRINZIDE,ZESTORETIC     TAKE these medications   B-12 PO Take 1 tablet by mouth every evening.   clonazePAM 0.5 MG tablet Commonly known as:  KLONOPIN Take 0.5 tablets (0.25 mg total) by mouth daily. What changed:    how much to take  when to take this   furosemide 20 MG tablet Commonly known as:  LASIX Take 20 mg by  mouth daily as needed (for fluid retention).   gabapentin 300 MG capsule Commonly known as:  NEURONTIN Take 2 capsules (600 mg total) by mouth at bedtime for 30 days.   insulin glargine 100 UNIT/ML injection Commonly known as:  LANTUS Inject 0.25 mLs (25 Units total) into the skin 2 (two) times daily. Replaces:  Insulin Glargine 100 UNIT/ML Solostar Pen   JANUVIA 50 MG tablet Generic drug:  sitaGLIPtin TAKE 1 TABLET BY MOUTH  DAILY What  changed:    how much to take  when to take this   metoprolol succinate 50 MG 24 hr tablet Commonly known as:  TOPROL-XL Take 50 mg by mouth at bedtime. Take with or immediately following a meal.   MYRBETRIQ 50 MG Tb24 tablet Generic drug:  mirabegron ER Take 50 mg by mouth every evening.   nystatin powder Commonly known as:  MYCOSTATIN/NYSTOP Apply topically 2 (two) times daily. Apply to affected skin for treatment of fungal infection. Please keep area clean and dry.   simvastatin 20 MG tablet Commonly known as:  ZOCOR Take 20 mg by mouth every evening.   tiZANidine 2 MG tablet Commonly known as:  ZANAFLEX Take 4 mg by mouth at bedtime.   Vitamin D3 125 MCG (5000 UT) Caps Take 1 capsule (5,000 Units total) by mouth daily. What changed:  when to take this      Follow-up Information    Sharilyn Sites, MD Follow up in 1 week(s).   Specialty:  Family Medicine Contact information: [address] [place] [address] [phone]          Allergies  Allergen Reactions  . Codeine Nausea And Vomiting    Consultations:  None   Procedures/Studies:  No results found.  Discharge Exam: Vitals:   02/09/19 2205 02/10/19 0600  BP: (!) 148/68 117/61  Pulse: (!) 101 73  Resp: 18 18  Temp: 98.4 F (36.9 C) 98 F (36.7 C)  SpO2: 96% 95%   Vitals:   02/09/19 0551 02/09/19 1438 02/09/19 2205 02/10/19 0600  BP: (!) 141/79 105/68 (!) 148/68 117/61  Pulse: 94 90 (!) 101 73  Resp: 18 18 18 18   Temp: 98.4 F (36.9 C) 97.8 F (36.6 C) 98.4 F (36.9 C) 98 F (36.7 C)  TempSrc: Oral Oral Oral Oral  SpO2: 97% 96% 96% 95%  Weight:      Height:        General: Pt is alert, awake, not in acute distress Cardiovascular: RRR, S1/S2 +, no rubs, no gallops Respiratory: CTA bilaterally, no wheezing, no rhonchi Abdominal: Soft, NT, ND, bowel sounds + Extremities: no edema, no cyanosis    The results of significant diagnostics from this hospitalization  (including imaging, microbiology, ancillary and laboratory) are listed below for reference.     Microbiology: No results found for this or any previous visit (from the past 240 hour(s)).   Labs: BNP (last 3 results) No results for input(s): BNP in the last 8760 hours. Basic Metabolic Panel: Recent Labs  Lab 02/07/19 1850 02/08/19 0419 02/09/19 0531  NA 128* 136 137  K 4.8 4.2 3.9  CL 96* 104 106  CO2 21* 20* 21*  GLUCOSE 573* 180* 219*  BUN 34* 29* 19  CREATININE 1.48* 1.19* 1.06*  CALCIUM 9.1 9.6 9.3   Liver Function Tests: Recent Labs  Lab 02/07/19 1850  AST 27  ALT 25  ALKPHOS 97  BILITOT 0.9  PROT 6.5  ALBUMIN 3.4*   No results for input(s): LIPASE,  AMYLASE in the last 168 hours. No results for input(s): AMMONIA in the last 168 hours. CBC: Recent Labs  Lab 02/07/19 1850  WBC 7.4  NEUTROABS 5.3  HGB 13.8  HCT 42.8  MCV 90.5  PLT 135*   Cardiac Enzymes: Recent Labs  Lab 02/08/19 0419  TROPONINI 0.04*   BNP: Invalid input(s): POCBNP CBG: Recent Labs  Lab 02/09/19 0835 02/09/19 1117 02/09/19 1618 02/09/19 2205 02/10/19 0726  GLUCAP 270* 282* 203* 251* 174*   D-Dimer No results for input(s): DDIMER in the last 72 hours. Hgb A1c Recent Labs    02/08/19 0419  HGBA1C 14.0*   Lipid Profile No results for input(s): CHOL, HDL, LDLCALC, TRIG, CHOLHDL, LDLDIRECT in the last 72 hours. Thyroid function studies No results for input(s): TSH, T4TOTAL, T3FREE, THYROIDAB in the last 72 hours.  Invalid input(s): FREET3 Anemia work up No results for input(s): VITAMINB12, FOLATE, FERRITIN, TIBC, IRON, RETICCTPCT in the last 72 hours. Urinalysis    Component Value Date/Time   COLORURINE AMBER (A) 02/07/2019 1832   APPEARANCEUR CLEAR 02/07/2019 1832   LABSPEC 1.021 02/07/2019 1832   PHURINE 5.0 02/07/2019 1832   GLUCOSEU >=500 (A) 02/07/2019 1832   HGBUR NEGATIVE 02/07/2019 1832   BILIRUBINUR NEGATIVE 02/07/2019 1832   KETONESUR NEGATIVE  02/07/2019 1832   PROTEINUR NEGATIVE 02/07/2019 1832   UROBILINOGEN 0.2 09/06/2009 0135   NITRITE NEGATIVE 02/07/2019 1832   LEUKOCYTESUR NEGATIVE 02/07/2019 1832   Sepsis Labs Invalid input(s): PROCALCITONIN,  WBC,  LACTICIDVEN Microbiology No results found for this or any previous visit (from the past 240 hour(s)).   Time coordinating discharge: 35 minutes  SIGNED:   Rodena Goldmann, DO [facility] 02/10/2019, 11:20 AM  If 7PM-7AM, please contact night-coverage [email or web address] Password [email or web address]

## 2019-02-10 NOTE — Care Management Note (Signed)
Case Management Note  Patient Details  Name: [name] MRN: [id number] Date of Birth: [birth year]  Subjective/Objective:   Admitted with hypotension. Pt from home, lives with husband. ind pta. Has had HH in the past. Has cane, walker. Pt recommended for SNF and declines, wants to go home with family support and do PT at home. She would like to use [facility] again as she has used them in the past. Aware HH has 48 hrs to make fist visit.                  Action/Plan: DC home today with [facility] nursing and PT referral. Vaughan Basta [facility] rep given referral.   Expected Discharge Date:  02/10/19               Expected Discharge Plan:  [place]  In-House Referral:  NA  Discharge planning Services  CM Consult  Post Acute Care Choice:  Home Health Choice offered to:  Patient  HH Arranged:  RN, PT, Refused SNF [facility] Agency:  [place]  Status of Service:  Completed, signed off  If discussed at [place] of Stay Meetings, dates discussed:    Additional Comments:  Sherald Barge, RN 02/10/2019, 11:33 AM

## 2019-02-10 NOTE — Progress Notes (Signed)
Discharge instructions given to patient and husband. Both verbalized understanding of instructions.  Patient discharged home with husband in stable condition.

## 2019-02-11 DIAGNOSIS — Z79899 Other long term (current) drug therapy: Secondary | ICD-10-CM | POA: Diagnosis not present

## 2019-02-11 DIAGNOSIS — Z6831 Body mass index (BMI) 31.0-31.9, adult: Secondary | ICD-10-CM | POA: Diagnosis not present

## 2019-02-11 DIAGNOSIS — I10 Essential (primary) hypertension: Secondary | ICD-10-CM | POA: Diagnosis not present

## 2019-02-11 DIAGNOSIS — Z794 Long term (current) use of insulin: Secondary | ICD-10-CM | POA: Diagnosis not present

## 2019-02-11 DIAGNOSIS — E6609 Other obesity due to excess calories: Secondary | ICD-10-CM | POA: Diagnosis not present

## 2019-02-11 DIAGNOSIS — Z8673 Personal history of transient ischemic attack (TIA), and cerebral infarction without residual deficits: Secondary | ICD-10-CM | POA: Diagnosis not present

## 2019-02-11 DIAGNOSIS — I959 Hypotension, unspecified: Secondary | ICD-10-CM | POA: Diagnosis not present

## 2019-02-11 DIAGNOSIS — L989 Disorder of the skin and subcutaneous tissue, unspecified: Secondary | ICD-10-CM | POA: Diagnosis not present

## 2019-02-11 DIAGNOSIS — E1165 Type 2 diabetes mellitus with hyperglycemia: Secondary | ICD-10-CM | POA: Diagnosis not present

## 2019-02-11 DIAGNOSIS — E1140 Type 2 diabetes mellitus with diabetic neuropathy, unspecified: Secondary | ICD-10-CM | POA: Diagnosis not present

## 2019-02-12 ENCOUNTER — Emergency Department (HOSPITAL_COMMUNITY)
Admission: EM | Admit: 2019-02-12 | Discharge: 2019-02-12 | Disposition: A | Discharge status: Home / Self Care | Payer: Medicare Other | Attending: Emergency Medicine | Admitting: Emergency Medicine

## 2019-02-12 ENCOUNTER — Encounter (HOSPITAL_COMMUNITY): Payer: Self-pay

## 2019-02-12 ENCOUNTER — Other Ambulatory Visit: Payer: Self-pay

## 2019-02-12 DIAGNOSIS — R609 Edema, unspecified: Secondary | ICD-10-CM | POA: Diagnosis not present

## 2019-02-12 DIAGNOSIS — I10 Essential (primary) hypertension: Secondary | ICD-10-CM | POA: Insufficient documentation

## 2019-02-12 DIAGNOSIS — Z794 Long term (current) use of insulin: Secondary | ICD-10-CM | POA: Diagnosis not present

## 2019-02-12 DIAGNOSIS — R739 Hyperglycemia, unspecified: Secondary | ICD-10-CM

## 2019-02-12 DIAGNOSIS — Z79899 Other long term (current) drug therapy: Secondary | ICD-10-CM | POA: Insufficient documentation

## 2019-02-12 DIAGNOSIS — R531 Weakness: Secondary | ICD-10-CM | POA: Diagnosis not present

## 2019-02-12 DIAGNOSIS — E1165 Type 2 diabetes mellitus with hyperglycemia: Secondary | ICD-10-CM | POA: Insufficient documentation

## 2019-02-12 LAB — CBG MONITORING, ED: Glucose-Capillary: 321 mg/dL — ABNORMAL HIGH (ref 70–99)

## 2019-02-12 LAB — BASIC METABOLIC PANEL WITH GFR
Anion gap: 10 (ref 5–15)
BUN: 32 mg/dL — ABNORMAL HIGH (ref 8–23)
CO2: 24 mmol/L (ref 22–32)
Calcium: 9.2 mg/dL (ref 8.9–10.3)
Chloride: 101 mmol/L (ref 98–111)
Creatinine, Ser: 1.57 mg/dL — ABNORMAL HIGH (ref 0.44–1.00)
GFR calc Af Amer: 34 mL/min — ABNORMAL LOW (ref >60)
GFR calc non Af Amer: 29 mL/min — ABNORMAL LOW (ref >60)
Glucose, Bld: 341 mg/dL — ABNORMAL HIGH (ref 70–99)
Potassium: 5 mmol/L (ref 3.5–5.1)
Sodium: 135 mmol/L (ref 135–145)

## 2019-02-12 LAB — CBC
HCT: 45.4 % (ref 36.0–46.0)
Hemoglobin: 14 g/dL (ref 12.0–15.0)
MCH: 28.7 pg (ref 26.0–34.0)
MCHC: 30.8 g/dL (ref 30.0–36.0)
MCV: 93.2 fL (ref 80.0–100.0)
Platelets: 202 10*3/uL (ref 150–400)
RBC: 4.87 MIL/uL (ref 3.87–5.11)
RDW: 13 % (ref 11.5–15.5)
WBC: 8.2 10*3/uL (ref 4.0–10.5)
nRBC: 0 % (ref 0.0–0.2)

## 2019-02-12 NOTE — ED Provider Notes (Signed)
[facility] EMERGENCY DEPARTMENT Provider Note   CSN: [id number] Arrival date & time: 02/12/19  1921    History   Chief Complaint Chief Complaint  Patient presents with  . Hyperglycemia    HPI [name] is a 85 y.o. female.     Patient was admitted to the hospital February 15 through February 18 part of her current concerns was for blood sugar control.  They made comments that they adjusted some of her blood pressure medicines and canceled her lisinopril hydrochlorothiazide this wanted her to continue her beta-blocker because her pressures were a little soft.  Sugar wise they felt that she did best in the hospital on Lantus 25 units twice daily along with her Januvia.  Patient was supposed to go to rehab but patient refused to do that.  Wanted to go home.  Patient was checking her blood pressure at home and was getting readings of 80.  EMS called out they got normal pressures patient in for evaluation states that she been feeling fatigued and weak.  No chest pain no shortness of breath no fevers no nausea vomiting or diarrhea not feeling like she is going to pass out.     Past Medical History:  Diagnosis Date  . Diabetes mellitus without complication ([place])   . Diabetic neuropathy ([place])   . Hypertension   . Stroke ([place])   . Urinary incontinence     Patient Active Problem List   Diagnosis Date Noted  . Hypotension 02/07/2019  . Slurred speech 11/17/2018  . Diabetic neuropathy ([place]) 11/17/2018  . AKI (acute kidney injury) ([place]) 11/17/2018  . Uncontrolled type 2 diabetes mellitus with hyperglycemia ([place]) 06/11/2018  . Hyperlipidemia 02/20/2016  . Essential hypertension, benign 10/10/2015  . Vitamin D deficiency 10/10/2015  . Class 1 obesity due to excess calories without serious comorbidity with body mass index (BMI) of 31.0 to 31.9 in adult 10/10/2015  . HIP PAIN 01/12/2009  . SPINAL STENOSIS 01/12/2009  . LOW BACK PAIN 01/12/2009  . SPONDYLOLYSIS 01/12/2009  .  SHOULDER PAIN 10/04/2008  . Uncontrolled type 2 diabetes mellitus with complication, without long-term current use of insulin ([place]) 10/01/2008    Past Surgical History:  Procedure Laterality Date  . ABDOMINAL HYSTERECTOMY    . APPENDECTOMY    . BREAST SURGERY     begnin tumor removed  . CATARACT EXTRACTION, BILATERAL       OB History   No obstetric history on file.      Home Medications    Prior to Admission medications   Medication Sig Start Date End Date Taking? Authorizing Provider  insulin glargine (LANTUS) 100 UNIT/ML injection Inject 0.25 mLs (25 Units total) into the skin 2 (two) times daily. Patient taking differently: Inject 24 Units into the skin 2 (two) times daily.  02/10/19  Yes Shah, Pratik D, DO  JANUVIA 50 MG tablet TAKE 1 TABLET BY MOUTH  DAILY Patient taking differently: Take 50 mg by mouth every evening.  05/01/18  Yes Cassandria Anger, MD  Cholecalciferol (VITAMIN D3) 5000 units CAPS Take 1 capsule (5,000 Units total) by mouth daily. Patient taking differently: Take 5,000 Units by mouth every evening.  04/15/17   Nida, Marella Chimes, MD  clonazePAM (KLONOPIN) 0.5 MG tablet Take 0.5 tablets (0.25 mg total) by mouth daily. Patient taking differently: Take 0.5 mg by mouth every evening.  10/07/18   Tat, Eustace Quail, DO  Cyanocobalamin (B-12 PO) Take 1 tablet by mouth every evening.    [provider]  furosemide (LASIX) 20 MG tablet Take 20 mg by mouth daily as needed (for fluid retention).     [provider]  gabapentin (NEURONTIN) 300 MG capsule Take 2 capsules (600 mg total) by mouth at bedtime for 30 days. 02/10/19 03/12/19  Manuella Ghazi, Pratik D, DO  metoprolol succinate (TOPROL-XL) 50 MG 24 hr tablet Take 50 mg by mouth at bedtime. Take with or immediately following a meal.     [provider]  MYRBETRIQ 50 MG TB24 tablet Take 50 mg by mouth every evening.  09/02/14   [provider]  nystatin (MYCOSTATIN/NYSTOP) powder Apply  topically 2 (two) times daily. Apply to affected skin for treatment of fungal infection. Please keep area clean and dry. 11/30/18   Barton Dubois, MD  simvastatin (ZOCOR) 20 MG tablet Take 20 mg by mouth every evening.    [provider]  tiZANidine (ZANAFLEX) 2 MG tablet Take 4 mg by mouth at bedtime.     [provider]    Family History Family History  Problem Relation Age of Onset  . CAD Mother   . Hypertension Mother   . Stroke Father   . Heart attack Brother   . Cancer Brother     Social History Social History   Tobacco Use  . Smoking status: Never Smoker  . Smokeless tobacco: Never Used  Substance Use Topics  . Alcohol use: No  . Drug use: No     Allergies   Codeine   Review of Systems Review of Systems  Constitutional: Positive for fatigue. Negative for chills and fever.  HENT: Negative for rhinorrhea and sore throat.   Eyes: Negative for visual disturbance.  Respiratory: Negative for cough and shortness of breath.   Cardiovascular: Negative for chest pain and leg swelling.  Gastrointestinal: Negative for abdominal pain, diarrhea, nausea and vomiting.  Genitourinary: Negative for dysuria.  Musculoskeletal: Negative for back pain and neck pain.  Skin: Negative for rash.  Neurological: Positive for weakness. Negative for dizziness, light-headedness and headaches.  Hematological: Does not bruise/bleed easily.  Psychiatric/Behavioral: Negative for confusion.     Physical Exam Updated Vital Signs BP 124/61   Pulse 60   Temp (!) 97.3 F (36.3 C) (Oral)   Resp 14   Ht 1.727 m (5\' 8" )   Wt 93.4 kg   SpO2 99%   BMI 31.32 kg/m   Physical Exam Vitals signs and nursing note reviewed.  Constitutional:      General: She is not in acute distress.    Appearance: She is well-developed.  HENT:     Head: Normocephalic and atraumatic.     Mouth/Throat:     Mouth: Mucous membranes are moist.  Eyes:     Extraocular Movements: Extraocular  movements intact.     Conjunctiva/sclera: Conjunctivae normal.     Pupils: Pupils are equal, round, and reactive to light.  Neck:     Musculoskeletal: Neck supple.  Cardiovascular:     Rate and Rhythm: Normal rate and regular rhythm.     Heart sounds: No murmur.  Pulmonary:     Effort: Pulmonary effort is normal. No respiratory distress.     Breath sounds: Normal breath sounds.  Abdominal:     Palpations: Abdomen is soft.     Tenderness: There is no abdominal tenderness.  Musculoskeletal: Normal range of motion.  Skin:    General: Skin is warm and dry.     Capillary Refill: Capillary refill takes less than 2 seconds.  Neurological:     General: No focal deficit present.     Mental Status: She is alert and oriented to person, place, and time.      ED Treatments / Results  Labs (all labs ordered are listed, but only abnormal results are displayed) Labs Reviewed  BASIC METABOLIC PANEL - Abnormal; Notable for the following components:      Result Value   Glucose, Bld 341 (*)    BUN 32 (*)    Creatinine, Ser 1.57 (*)    GFR calc non Af Amer 29 (*)    GFR calc Af Amer 34 (*)    All other components within normal limits  CBG MONITORING, ED - Abnormal; Notable for the following components:   Glucose-Capillary 321 (*)    All other components within normal limits  CBC    EKG None  Radiology No results found.  Procedures Procedures (including critical care time)  Medications Ordered in ED Medications - No data to display   Initial Impression / Assessment and Plan / ED Course  I have reviewed the triage vital signs and the nursing notes.  Pertinent labs & imaging results that were available during my care of the patient were reviewed by me and considered in my medical decision making (see chart for details).        Patient was concerned about low blood pressures and concerned about her blood sugars being high.  Patient was just discharged from the hospital on the  18th patient was admitted at that time for similar concerns that she had tonight.  She was getting low blood pressure readings at home but here blood pressures are fine.  Blood sugar is a little high at 341 but right now that is better than being too low she has follow-up with her primary care doctor tomorrow and they can make adjustments as appropriate.  Patient nontoxic no acute distress.    Final Clinical Impressions(s) / ED Diagnoses   Final diagnoses:  Hyperglycemia    ED Discharge Orders    None       Fredia Sorrow, MD 02/12/19 2209

## 2019-02-12 NOTE — Discharge Instructions (Addendum)
Follow-up with your primary care doctor as scheduled for tomorrow.  You will need to have your blood sugars followed closely.  Today the blood pressure is fine.  Return for any new or worse symptoms.  Continue medications as they recommended when you were recently discharged from the hospital.

## 2019-02-12 NOTE — ED Triage Notes (Signed)
Pt just dc'd from inpatient at [facility] yesterday, returns to day for high blood sugar (384 per ems) and that her blood pressure at home was low (per her home machine).Pt denies pain, states she "just don't feel good".  Pt is awake and alert, nad

## 2019-02-13 ENCOUNTER — Other Ambulatory Visit: Payer: Self-pay

## 2019-02-13 DIAGNOSIS — R739 Hyperglycemia, unspecified: Secondary | ICD-10-CM | POA: Diagnosis not present

## 2019-02-13 DIAGNOSIS — E1022 Type 1 diabetes mellitus with diabetic chronic kidney disease: Secondary | ICD-10-CM | POA: Diagnosis not present

## 2019-02-13 DIAGNOSIS — E6609 Other obesity due to excess calories: Secondary | ICD-10-CM | POA: Diagnosis not present

## 2019-02-13 DIAGNOSIS — Z6830 Body mass index (BMI) 30.0-30.9, adult: Secondary | ICD-10-CM | POA: Diagnosis not present

## 2019-02-13 NOTE — Patient Outreach (Signed)
[place] [facility]) Care Management  02/13/2019  [name] 01/04/1929 [id number]    EMMI-General Discharge RED ON EMMI ALERT Day #1 Date: 02/12/2019 Red Alert Reason: "Unfilled prescriptions? Yes"   Outreach attempt # 1 to patient. Spoke with patient who reports she is doing fairly well. She denies any acute issues or concerns at this time. Reviewed and addressed red alert with patient. She reports that she was waiting for some of her meds to come via mail order. She voices that they came in the mail today. Patient confirmed that she has all her meds in the home and denies any issues or concerns regarding them at this time. She states that she goes for PCP appt today and denies any issues with transportation. No further RN CM needs or concerns identified. Advised patient that they would get one more automated EMMI-GENERAL post discharge calls to assess how they are doing following recent hospitalization and will receive a call from a nurse if any of their responses were abnormal. Patient voiced understanding and was appreciative of f/u call.      Plan: RN CM will close case as no further interventions needed at this time.   Enzo Montgomery, RN,BSN,CCM [place] Management Telephonic Care Management Coordinator Direct Phone: [phone] Toll Free: [phone] Fax: [phone]

## 2019-02-14 DIAGNOSIS — I10 Essential (primary) hypertension: Secondary | ICD-10-CM | POA: Diagnosis not present

## 2019-02-14 DIAGNOSIS — L989 Disorder of the skin and subcutaneous tissue, unspecified: Secondary | ICD-10-CM | POA: Diagnosis not present

## 2019-02-14 DIAGNOSIS — E1165 Type 2 diabetes mellitus with hyperglycemia: Secondary | ICD-10-CM | POA: Diagnosis not present

## 2019-02-14 DIAGNOSIS — E6609 Other obesity due to excess calories: Secondary | ICD-10-CM | POA: Diagnosis not present

## 2019-02-14 DIAGNOSIS — I959 Hypotension, unspecified: Secondary | ICD-10-CM | POA: Diagnosis not present

## 2019-02-14 DIAGNOSIS — E1140 Type 2 diabetes mellitus with diabetic neuropathy, unspecified: Secondary | ICD-10-CM | POA: Diagnosis not present

## 2019-02-16 DIAGNOSIS — E1165 Type 2 diabetes mellitus with hyperglycemia: Secondary | ICD-10-CM | POA: Diagnosis not present

## 2019-02-16 DIAGNOSIS — E6609 Other obesity due to excess calories: Secondary | ICD-10-CM | POA: Diagnosis not present

## 2019-02-16 DIAGNOSIS — I959 Hypotension, unspecified: Secondary | ICD-10-CM | POA: Diagnosis not present

## 2019-02-16 DIAGNOSIS — I10 Essential (primary) hypertension: Secondary | ICD-10-CM | POA: Diagnosis not present

## 2019-02-16 DIAGNOSIS — E1140 Type 2 diabetes mellitus with diabetic neuropathy, unspecified: Secondary | ICD-10-CM | POA: Diagnosis not present

## 2019-02-16 DIAGNOSIS — L989 Disorder of the skin and subcutaneous tissue, unspecified: Secondary | ICD-10-CM | POA: Diagnosis not present

## 2019-02-17 DIAGNOSIS — I10 Essential (primary) hypertension: Secondary | ICD-10-CM | POA: Diagnosis not present

## 2019-02-17 DIAGNOSIS — E6609 Other obesity due to excess calories: Secondary | ICD-10-CM | POA: Diagnosis not present

## 2019-02-17 DIAGNOSIS — I959 Hypotension, unspecified: Secondary | ICD-10-CM | POA: Diagnosis not present

## 2019-02-17 DIAGNOSIS — E1140 Type 2 diabetes mellitus with diabetic neuropathy, unspecified: Secondary | ICD-10-CM | POA: Diagnosis not present

## 2019-02-17 DIAGNOSIS — L989 Disorder of the skin and subcutaneous tissue, unspecified: Secondary | ICD-10-CM | POA: Diagnosis not present

## 2019-02-17 DIAGNOSIS — E1165 Type 2 diabetes mellitus with hyperglycemia: Secondary | ICD-10-CM | POA: Diagnosis not present

## 2019-02-18 DIAGNOSIS — E6609 Other obesity due to excess calories: Secondary | ICD-10-CM | POA: Diagnosis not present

## 2019-02-18 DIAGNOSIS — E1140 Type 2 diabetes mellitus with diabetic neuropathy, unspecified: Secondary | ICD-10-CM | POA: Diagnosis not present

## 2019-02-18 DIAGNOSIS — L989 Disorder of the skin and subcutaneous tissue, unspecified: Secondary | ICD-10-CM | POA: Diagnosis not present

## 2019-02-18 DIAGNOSIS — I10 Essential (primary) hypertension: Secondary | ICD-10-CM | POA: Diagnosis not present

## 2019-02-18 DIAGNOSIS — I959 Hypotension, unspecified: Secondary | ICD-10-CM | POA: Diagnosis not present

## 2019-02-18 DIAGNOSIS — E1165 Type 2 diabetes mellitus with hyperglycemia: Secondary | ICD-10-CM | POA: Diagnosis not present

## 2019-02-19 DIAGNOSIS — E1165 Type 2 diabetes mellitus with hyperglycemia: Secondary | ICD-10-CM | POA: Diagnosis not present

## 2019-02-19 DIAGNOSIS — I959 Hypotension, unspecified: Secondary | ICD-10-CM | POA: Diagnosis not present

## 2019-02-19 DIAGNOSIS — L989 Disorder of the skin and subcutaneous tissue, unspecified: Secondary | ICD-10-CM | POA: Diagnosis not present

## 2019-02-19 DIAGNOSIS — E6609 Other obesity due to excess calories: Secondary | ICD-10-CM | POA: Diagnosis not present

## 2019-02-19 DIAGNOSIS — I10 Essential (primary) hypertension: Secondary | ICD-10-CM | POA: Diagnosis not present

## 2019-02-19 DIAGNOSIS — E1140 Type 2 diabetes mellitus with diabetic neuropathy, unspecified: Secondary | ICD-10-CM | POA: Diagnosis not present

## 2019-02-23 DIAGNOSIS — E1140 Type 2 diabetes mellitus with diabetic neuropathy, unspecified: Secondary | ICD-10-CM | POA: Diagnosis not present

## 2019-02-23 DIAGNOSIS — E1165 Type 2 diabetes mellitus with hyperglycemia: Secondary | ICD-10-CM | POA: Diagnosis not present

## 2019-02-23 DIAGNOSIS — I959 Hypotension, unspecified: Secondary | ICD-10-CM | POA: Diagnosis not present

## 2019-02-23 DIAGNOSIS — L989 Disorder of the skin and subcutaneous tissue, unspecified: Secondary | ICD-10-CM | POA: Diagnosis not present

## 2019-02-23 DIAGNOSIS — E6609 Other obesity due to excess calories: Secondary | ICD-10-CM | POA: Diagnosis not present

## 2019-02-23 DIAGNOSIS — I10 Essential (primary) hypertension: Secondary | ICD-10-CM | POA: Diagnosis not present

## 2019-02-24 DIAGNOSIS — E6609 Other obesity due to excess calories: Secondary | ICD-10-CM | POA: Diagnosis not present

## 2019-02-24 DIAGNOSIS — E1140 Type 2 diabetes mellitus with diabetic neuropathy, unspecified: Secondary | ICD-10-CM | POA: Diagnosis not present

## 2019-02-24 DIAGNOSIS — B351 Tinea unguium: Secondary | ICD-10-CM | POA: Diagnosis not present

## 2019-02-24 DIAGNOSIS — E1165 Type 2 diabetes mellitus with hyperglycemia: Secondary | ICD-10-CM | POA: Diagnosis not present

## 2019-02-24 DIAGNOSIS — I959 Hypotension, unspecified: Secondary | ICD-10-CM | POA: Diagnosis not present

## 2019-02-24 DIAGNOSIS — L989 Disorder of the skin and subcutaneous tissue, unspecified: Secondary | ICD-10-CM | POA: Diagnosis not present

## 2019-02-24 DIAGNOSIS — I10 Essential (primary) hypertension: Secondary | ICD-10-CM | POA: Diagnosis not present

## 2019-02-24 DIAGNOSIS — E1142 Type 2 diabetes mellitus with diabetic polyneuropathy: Secondary | ICD-10-CM | POA: Diagnosis not present

## 2019-02-25 DIAGNOSIS — L989 Disorder of the skin and subcutaneous tissue, unspecified: Secondary | ICD-10-CM | POA: Diagnosis not present

## 2019-02-25 DIAGNOSIS — E1140 Type 2 diabetes mellitus with diabetic neuropathy, unspecified: Secondary | ICD-10-CM | POA: Diagnosis not present

## 2019-02-25 DIAGNOSIS — I959 Hypotension, unspecified: Secondary | ICD-10-CM | POA: Diagnosis not present

## 2019-02-25 DIAGNOSIS — I10 Essential (primary) hypertension: Secondary | ICD-10-CM | POA: Diagnosis not present

## 2019-02-25 DIAGNOSIS — E1165 Type 2 diabetes mellitus with hyperglycemia: Secondary | ICD-10-CM | POA: Diagnosis not present

## 2019-02-25 DIAGNOSIS — E6609 Other obesity due to excess calories: Secondary | ICD-10-CM | POA: Diagnosis not present

## 2019-02-26 DIAGNOSIS — E1140 Type 2 diabetes mellitus with diabetic neuropathy, unspecified: Secondary | ICD-10-CM | POA: Diagnosis not present

## 2019-02-26 DIAGNOSIS — I959 Hypotension, unspecified: Secondary | ICD-10-CM | POA: Diagnosis not present

## 2019-02-26 DIAGNOSIS — I10 Essential (primary) hypertension: Secondary | ICD-10-CM | POA: Diagnosis not present

## 2019-02-26 DIAGNOSIS — E1165 Type 2 diabetes mellitus with hyperglycemia: Secondary | ICD-10-CM | POA: Diagnosis not present

## 2019-02-26 DIAGNOSIS — E6609 Other obesity due to excess calories: Secondary | ICD-10-CM | POA: Diagnosis not present

## 2019-02-26 DIAGNOSIS — L989 Disorder of the skin and subcutaneous tissue, unspecified: Secondary | ICD-10-CM | POA: Diagnosis not present

## 2019-03-03 DIAGNOSIS — E1140 Type 2 diabetes mellitus with diabetic neuropathy, unspecified: Secondary | ICD-10-CM | POA: Diagnosis not present

## 2019-03-03 DIAGNOSIS — L989 Disorder of the skin and subcutaneous tissue, unspecified: Secondary | ICD-10-CM | POA: Diagnosis not present

## 2019-03-03 DIAGNOSIS — I959 Hypotension, unspecified: Secondary | ICD-10-CM | POA: Diagnosis not present

## 2019-03-03 DIAGNOSIS — E6609 Other obesity due to excess calories: Secondary | ICD-10-CM | POA: Diagnosis not present

## 2019-03-03 DIAGNOSIS — I10 Essential (primary) hypertension: Secondary | ICD-10-CM | POA: Diagnosis not present

## 2019-03-03 DIAGNOSIS — E1165 Type 2 diabetes mellitus with hyperglycemia: Secondary | ICD-10-CM | POA: Diagnosis not present

## 2019-03-04 ENCOUNTER — Telehealth: Payer: Self-pay

## 2019-03-04 DIAGNOSIS — I10 Essential (primary) hypertension: Secondary | ICD-10-CM

## 2019-03-04 DIAGNOSIS — E6609 Other obesity due to excess calories: Secondary | ICD-10-CM | POA: Diagnosis not present

## 2019-03-04 DIAGNOSIS — E1165 Type 2 diabetes mellitus with hyperglycemia: Secondary | ICD-10-CM

## 2019-03-04 DIAGNOSIS — E1140 Type 2 diabetes mellitus with diabetic neuropathy, unspecified: Secondary | ICD-10-CM | POA: Diagnosis not present

## 2019-03-04 DIAGNOSIS — E782 Mixed hyperlipidemia: Secondary | ICD-10-CM

## 2019-03-04 DIAGNOSIS — I959 Hypotension, unspecified: Secondary | ICD-10-CM | POA: Diagnosis not present

## 2019-03-04 DIAGNOSIS — L989 Disorder of the skin and subcutaneous tissue, unspecified: Secondary | ICD-10-CM | POA: Diagnosis not present

## 2019-03-04 NOTE — Telephone Encounter (Signed)
LeighAnn Arley Garant, CMA  

## 2019-03-05 ENCOUNTER — Other Ambulatory Visit: Payer: Self-pay | Admitting: "Endocrinology

## 2019-03-05 DIAGNOSIS — I959 Hypotension, unspecified: Secondary | ICD-10-CM | POA: Diagnosis not present

## 2019-03-05 DIAGNOSIS — Z6835 Body mass index (BMI) 35.0-35.9, adult: Secondary | ICD-10-CM | POA: Diagnosis not present

## 2019-03-05 DIAGNOSIS — E6609 Other obesity due to excess calories: Secondary | ICD-10-CM | POA: Diagnosis not present

## 2019-03-05 DIAGNOSIS — E1140 Type 2 diabetes mellitus with diabetic neuropathy, unspecified: Secondary | ICD-10-CM | POA: Diagnosis not present

## 2019-03-05 DIAGNOSIS — Z1389 Encounter for screening for other disorder: Secondary | ICD-10-CM | POA: Diagnosis not present

## 2019-03-05 DIAGNOSIS — I10 Essential (primary) hypertension: Secondary | ICD-10-CM | POA: Diagnosis not present

## 2019-03-05 DIAGNOSIS — E1165 Type 2 diabetes mellitus with hyperglycemia: Secondary | ICD-10-CM | POA: Diagnosis not present

## 2019-03-05 DIAGNOSIS — L989 Disorder of the skin and subcutaneous tissue, unspecified: Secondary | ICD-10-CM | POA: Diagnosis not present

## 2019-03-11 DIAGNOSIS — I959 Hypotension, unspecified: Secondary | ICD-10-CM | POA: Diagnosis not present

## 2019-03-11 DIAGNOSIS — L989 Disorder of the skin and subcutaneous tissue, unspecified: Secondary | ICD-10-CM | POA: Diagnosis not present

## 2019-03-11 DIAGNOSIS — E1140 Type 2 diabetes mellitus with diabetic neuropathy, unspecified: Secondary | ICD-10-CM | POA: Diagnosis not present

## 2019-03-11 DIAGNOSIS — I10 Essential (primary) hypertension: Secondary | ICD-10-CM | POA: Diagnosis not present

## 2019-03-11 DIAGNOSIS — E1165 Type 2 diabetes mellitus with hyperglycemia: Secondary | ICD-10-CM | POA: Diagnosis not present

## 2019-03-11 DIAGNOSIS — E6609 Other obesity due to excess calories: Secondary | ICD-10-CM | POA: Diagnosis not present

## 2019-03-13 DIAGNOSIS — L989 Disorder of the skin and subcutaneous tissue, unspecified: Secondary | ICD-10-CM | POA: Diagnosis not present

## 2019-03-13 DIAGNOSIS — Z6831 Body mass index (BMI) 31.0-31.9, adult: Secondary | ICD-10-CM | POA: Diagnosis not present

## 2019-03-13 DIAGNOSIS — E1165 Type 2 diabetes mellitus with hyperglycemia: Secondary | ICD-10-CM | POA: Diagnosis not present

## 2019-03-13 DIAGNOSIS — Z79899 Other long term (current) drug therapy: Secondary | ICD-10-CM | POA: Diagnosis not present

## 2019-03-13 DIAGNOSIS — I959 Hypotension, unspecified: Secondary | ICD-10-CM | POA: Diagnosis not present

## 2019-03-13 DIAGNOSIS — E6609 Other obesity due to excess calories: Secondary | ICD-10-CM | POA: Diagnosis not present

## 2019-03-13 DIAGNOSIS — Z794 Long term (current) use of insulin: Secondary | ICD-10-CM | POA: Diagnosis not present

## 2019-03-13 DIAGNOSIS — E1140 Type 2 diabetes mellitus with diabetic neuropathy, unspecified: Secondary | ICD-10-CM | POA: Diagnosis not present

## 2019-03-13 DIAGNOSIS — I10 Essential (primary) hypertension: Secondary | ICD-10-CM | POA: Diagnosis not present

## 2019-03-13 DIAGNOSIS — Z8673 Personal history of transient ischemic attack (TIA), and cerebral infarction without residual deficits: Secondary | ICD-10-CM | POA: Diagnosis not present

## 2019-03-19 DIAGNOSIS — I10 Essential (primary) hypertension: Secondary | ICD-10-CM | POA: Diagnosis not present

## 2019-03-19 DIAGNOSIS — E1140 Type 2 diabetes mellitus with diabetic neuropathy, unspecified: Secondary | ICD-10-CM | POA: Diagnosis not present

## 2019-03-19 DIAGNOSIS — E6609 Other obesity due to excess calories: Secondary | ICD-10-CM | POA: Diagnosis not present

## 2019-03-19 DIAGNOSIS — I959 Hypotension, unspecified: Secondary | ICD-10-CM | POA: Diagnosis not present

## 2019-03-19 DIAGNOSIS — E1165 Type 2 diabetes mellitus with hyperglycemia: Secondary | ICD-10-CM | POA: Diagnosis not present

## 2019-03-19 DIAGNOSIS — L989 Disorder of the skin and subcutaneous tissue, unspecified: Secondary | ICD-10-CM | POA: Diagnosis not present

## 2019-03-24 ENCOUNTER — Emergency Department (HOSPITAL_COMMUNITY): Payer: Medicare Other

## 2019-03-24 ENCOUNTER — Inpatient Hospital Stay (HOSPITAL_COMMUNITY)
Admission: EM | Admit: 2019-03-24 | Discharge: 2019-03-26 | DRG: 917 | Disposition: A | Discharge status: Skilled Nursing Facility | Payer: Medicare Other | Attending: Internal Medicine | Admitting: Internal Medicine

## 2019-03-24 ENCOUNTER — Inpatient Hospital Stay (HOSPITAL_COMMUNITY): Payer: Medicare Other

## 2019-03-24 ENCOUNTER — Other Ambulatory Visit: Payer: Self-pay

## 2019-03-24 ENCOUNTER — Encounter (HOSPITAL_COMMUNITY): Payer: Self-pay | Admitting: Emergency Medicine

## 2019-03-24 DIAGNOSIS — Z7401 Bed confinement status: Secondary | ICD-10-CM | POA: Diagnosis not present

## 2019-03-24 DIAGNOSIS — R404 Transient alteration of awareness: Secondary | ICD-10-CM | POA: Diagnosis not present

## 2019-03-24 DIAGNOSIS — I10 Essential (primary) hypertension: Secondary | ICD-10-CM | POA: Diagnosis not present

## 2019-03-24 DIAGNOSIS — B372 Candidiasis of skin and nail: Secondary | ICD-10-CM | POA: Diagnosis not present

## 2019-03-24 DIAGNOSIS — R0689 Other abnormalities of breathing: Secondary | ICD-10-CM | POA: Diagnosis not present

## 2019-03-24 DIAGNOSIS — Z8673 Personal history of transient ischemic attack (TIA), and cerebral infarction without residual deficits: Secondary | ICD-10-CM | POA: Diagnosis not present

## 2019-03-24 DIAGNOSIS — E1165 Type 2 diabetes mellitus with hyperglycemia: Secondary | ICD-10-CM | POA: Diagnosis present

## 2019-03-24 DIAGNOSIS — Z823 Family history of stroke: Secondary | ICD-10-CM

## 2019-03-24 DIAGNOSIS — Z9114 Patient's other noncompliance with medication regimen: Secondary | ICD-10-CM

## 2019-03-24 DIAGNOSIS — G9340 Encephalopathy, unspecified: Secondary | ICD-10-CM | POA: Diagnosis present

## 2019-03-24 DIAGNOSIS — G311 Senile degeneration of brain, not elsewhere classified: Secondary | ICD-10-CM | POA: Diagnosis not present

## 2019-03-24 DIAGNOSIS — T50911A Poisoning by multiple unspecified drugs, medicaments and biological substances, accidental (unintentional), initial encounter: Secondary | ICD-10-CM | POA: Diagnosis present

## 2019-03-24 DIAGNOSIS — G9341 Metabolic encephalopathy: Secondary | ICD-10-CM | POA: Diagnosis not present

## 2019-03-24 DIAGNOSIS — Y92009 Unspecified place in unspecified non-institutional (private) residence as the place of occurrence of the external cause: Secondary | ICD-10-CM

## 2019-03-24 DIAGNOSIS — R410 Disorientation, unspecified: Secondary | ICD-10-CM

## 2019-03-24 DIAGNOSIS — Z794 Long term (current) use of insulin: Secondary | ICD-10-CM | POA: Diagnosis not present

## 2019-03-24 DIAGNOSIS — L819 Disorder of pigmentation, unspecified: Secondary | ICD-10-CM | POA: Diagnosis not present

## 2019-03-24 DIAGNOSIS — I69328 Other speech and language deficits following cerebral infarction: Secondary | ICD-10-CM | POA: Diagnosis not present

## 2019-03-24 DIAGNOSIS — E1140 Type 2 diabetes mellitus with diabetic neuropathy, unspecified: Secondary | ICD-10-CM | POA: Diagnosis present

## 2019-03-24 DIAGNOSIS — Z8249 Family history of ischemic heart disease and other diseases of the circulatory system: Secondary | ICD-10-CM | POA: Diagnosis not present

## 2019-03-24 DIAGNOSIS — I69391 Dysphagia following cerebral infarction: Secondary | ICD-10-CM | POA: Diagnosis not present

## 2019-03-24 DIAGNOSIS — M6281 Muscle weakness (generalized): Secondary | ICD-10-CM | POA: Diagnosis not present

## 2019-03-24 DIAGNOSIS — E785 Hyperlipidemia, unspecified: Secondary | ICD-10-CM | POA: Diagnosis present

## 2019-03-24 DIAGNOSIS — G92 Toxic encephalopathy: Secondary | ICD-10-CM | POA: Diagnosis present

## 2019-03-24 DIAGNOSIS — E876 Hypokalemia: Secondary | ICD-10-CM | POA: Diagnosis present

## 2019-03-24 DIAGNOSIS — I639 Cerebral infarction, unspecified: Secondary | ICD-10-CM | POA: Diagnosis not present

## 2019-03-24 DIAGNOSIS — R4182 Altered mental status, unspecified: Secondary | ICD-10-CM | POA: Diagnosis not present

## 2019-03-24 DIAGNOSIS — E860 Dehydration: Secondary | ICD-10-CM | POA: Diagnosis not present

## 2019-03-24 DIAGNOSIS — I6782 Cerebral ischemia: Secondary | ICD-10-CM | POA: Diagnosis not present

## 2019-03-24 DIAGNOSIS — E782 Mixed hyperlipidemia: Secondary | ICD-10-CM | POA: Diagnosis present

## 2019-03-24 LAB — GLUCOSE, CAPILLARY
Glucose-Capillary: 216 mg/dL — ABNORMAL HIGH (ref 70–99)
Glucose-Capillary: 175 mg/dL — ABNORMAL HIGH (ref 70–99)

## 2019-03-24 LAB — URINALYSIS, ROUTINE W REFLEX MICROSCOPIC
Bacteria, UA: NONE SEEN
Bilirubin Urine: NEGATIVE
Glucose, UA: >=500 mg/dL — AB
Ketones, ur: 80 mg/dL — AB
Leukocytes,Ua: NEGATIVE
Nitrite: NEGATIVE
Protein, ur: NEGATIVE mg/dL
Specific Gravity, Urine: 1.02 (ref 1.005–1.030)
pH: 5 (ref 5.0–8.0)

## 2019-03-24 LAB — COMPREHENSIVE METABOLIC PANEL
ALT: 14 U/L (ref 0–44)
AST: 18 U/L (ref 15–41)
Albumin: 3.3 g/dL — ABNORMAL LOW (ref 3.5–5.0)
Alkaline Phosphatase: 79 U/L (ref 38–126)
Anion gap: 16 — ABNORMAL HIGH (ref 5–15)
BUN: 6 mg/dL — ABNORMAL LOW (ref 8–23)
CO2: 17 mmol/L — ABNORMAL LOW (ref 22–32)
Calcium: 8.7 mg/dL — ABNORMAL LOW (ref 8.9–10.3)
Chloride: 104 mmol/L (ref 98–111)
Creatinine, Ser: 0.82 mg/dL (ref 0.44–1.00)
GFR calc Af Amer: >60 mL/min (ref >60)
GFR calc non Af Amer: >60 mL/min (ref >60)
Glucose, Bld: 232 mg/dL — ABNORMAL HIGH (ref 70–99)
Potassium: 3.3 mmol/L — ABNORMAL LOW (ref 3.5–5.1)
Sodium: 137 mmol/L (ref 135–145)
Total Bilirubin: 1.9 mg/dL — ABNORMAL HIGH (ref 0.3–1.2)
Total Protein: 6.7 g/dL (ref 6.5–8.1)

## 2019-03-24 LAB — CBC
HCT: 43 % (ref 36.0–46.0)
Hemoglobin: 14.2 g/dL (ref 12.0–15.0)
MCH: 29.2 pg (ref 26.0–34.0)
MCHC: 33 g/dL (ref 30.0–36.0)
MCV: 88.5 fL (ref 80.0–100.0)
Platelets: 233 10*3/uL (ref 150–400)
RBC: 4.86 MIL/uL (ref 3.87–5.11)
RDW: 13.1 % (ref 11.5–15.5)
WBC: 9.8 10*3/uL (ref 4.0–10.5)
nRBC: 0 % (ref 0.0–0.2)

## 2019-03-24 LAB — LACTIC ACID, PLASMA
Lactic Acid, Venous: 1.5 mmol/L (ref 0.5–1.9)
Lactic Acid, Venous: 1.6 mmol/L (ref 0.5–1.9)

## 2019-03-24 LAB — CBG MONITORING, ED
Glucose-Capillary: 241 mg/dL — ABNORMAL HIGH (ref 70–99)
Glucose-Capillary: 226 mg/dL — ABNORMAL HIGH (ref 70–99)

## 2019-03-24 LAB — CK: Total CK: 57 U/L (ref 38–234)

## 2019-03-24 LAB — TSH: TSH: 4.137 u[IU]/mL (ref 0.350–4.500)

## 2019-03-24 LAB — AMMONIA: Ammonia: <9 umol/L — ABNORMAL LOW (ref 9–35)

## 2019-03-24 MED ORDER — POTASSIUM CHLORIDE CRYS ER 20 MEQ PO TBCR
40.0000 meq | EXTENDED_RELEASE_TABLET | Freq: Once | ORAL | Status: AC
  Administered 2019-03-24: 40 meq via ORAL
  Filled 2019-03-24: qty 2

## 2019-03-24 MED ORDER — ONDANSETRON HCL 4 MG/2ML IJ SOLN: 4.0000 mg | Freq: Four times a day (QID) | INTRAMUSCULAR | Status: DC | PRN

## 2019-03-24 MED ORDER — ENOXAPARIN SODIUM 40 MG/0.4ML ~~LOC~~ SOLN
40.0000 mg | SUBCUTANEOUS | Status: DC
  Administered 2019-03-24 – 2019-03-25 (×2): 40 mg via SUBCUTANEOUS
  Filled 2019-03-24 (×2): qty 0.4

## 2019-03-24 MED ORDER — INSULIN GLARGINE 100 UNIT/ML ~~LOC~~ SOLN
25.0000 [IU] | Freq: Two times a day (BID) | SUBCUTANEOUS | Status: DC
  Administered 2019-03-24 – 2019-03-26 (×4): 25 [IU] via SUBCUTANEOUS
  Filled 2019-03-24 (×7): qty 0.25

## 2019-03-24 MED ORDER — INSULIN ASPART 100 UNIT/ML ~~LOC~~ SOLN
0.0000 [IU] | Freq: Three times a day (TID) | SUBCUTANEOUS | Status: DC
  Administered 2019-03-24: 5 [IU] via SUBCUTANEOUS
  Administered 2019-03-25: 3 [IU] via SUBCUTANEOUS
  Administered 2019-03-25 – 2019-03-26 (×4): 2 [IU] via SUBCUTANEOUS

## 2019-03-24 MED ORDER — MIRABEGRON ER 25 MG PO TB24
50.0000 mg | ORAL_TABLET | Freq: Every evening | ORAL | Status: DC
  Administered 2019-03-24 – 2019-03-25 (×2): 50 mg via ORAL
  Filled 2019-03-24 (×2): qty 2

## 2019-03-24 MED ORDER — METOPROLOL SUCCINATE ER 50 MG PO TB24
50.0000 mg | ORAL_TABLET | Freq: Every day | ORAL | Status: DC
  Administered 2019-03-24 – 2019-03-25 (×2): 50 mg via ORAL
  Filled 2019-03-24 (×2): qty 1

## 2019-03-24 MED ORDER — SODIUM CHLORIDE 0.9% FLUSH: 3.0000 mL | Freq: Once | INTRAVENOUS | Status: DC

## 2019-03-24 MED ORDER — ACETAMINOPHEN 650 MG RE SUPP: 650.0000 mg | Freq: Four times a day (QID) | RECTAL | Status: DC | PRN

## 2019-03-24 MED ORDER — ONDANSETRON HCL 4 MG PO TABS: 4.0000 mg | ORAL_TABLET | Freq: Four times a day (QID) | ORAL | Status: DC | PRN

## 2019-03-24 MED ORDER — LINAGLIPTIN 5 MG PO TABS
5.0000 mg | ORAL_TABLET | Freq: Every day | ORAL | Status: DC
  Administered 2019-03-24 – 2019-03-26 (×3): 5 mg via ORAL
  Filled 2019-03-24 (×4): qty 1

## 2019-03-24 MED ORDER — HYDRALAZINE HCL 20 MG/ML IJ SOLN
10.0000 mg | INTRAMUSCULAR | Status: DC | PRN
  Administered 2019-03-25 (×2): 10 mg via INTRAVENOUS
  Filled 2019-03-24 (×2): qty 1

## 2019-03-24 MED ORDER — VITAMIN D 25 MCG (1000 UNIT) PO TABS
5000.0000 [IU] | ORAL_TABLET | Freq: Every day | ORAL | Status: DC
  Administered 2019-03-24 – 2019-03-26 (×3): 5000 [IU] via ORAL
  Filled 2019-03-24 (×3): qty 5

## 2019-03-24 MED ORDER — LACTATED RINGERS IV SOLN
INTRAVENOUS | Status: AC
  Administered 2019-03-24: 17:00:00 via INTRAVENOUS

## 2019-03-24 MED ORDER — VITAMIN B-12 100 MCG PO TABS
250.0000 ug | ORAL_TABLET | Freq: Every evening | ORAL | Status: DC
  Administered 2019-03-24 – 2019-03-25 (×2): 250 ug via ORAL
  Filled 2019-03-24 (×2): qty 3

## 2019-03-24 MED ORDER — SODIUM CHLORIDE 0.9 % IV BOLUS
500.0000 mL | Freq: Once | INTRAVENOUS | Status: AC
  Administered 2019-03-24: 500 mL via INTRAVENOUS

## 2019-03-24 MED ORDER — NYSTATIN 100000 UNIT/GM EX POWD
Freq: Three times a day (TID) | CUTANEOUS | Status: DC
  Administered 2019-03-24 – 2019-03-26 (×7): via TOPICAL
  Filled 2019-03-24 (×3): qty 15

## 2019-03-24 MED ORDER — ACETAMINOPHEN 325 MG PO TABS: 650.0000 mg | ORAL_TABLET | Freq: Four times a day (QID) | ORAL | Status: DC | PRN

## 2019-03-24 MED ORDER — INSULIN ASPART 100 UNIT/ML ~~LOC~~ SOLN: 0.0000 [IU] | Freq: Every day | SUBCUTANEOUS | Status: DC

## 2019-03-24 NOTE — ED Provider Notes (Signed)
Emergency Department Provider Note   I have reviewed the triage vital signs and the nursing notes.   HISTORY  Chief Complaint Altered Mental Status   HPI INOCENCIA MURTAUGH is a 85 y.o. female with PMH of IDDM, HTN, and prior CVA  presents to the emergency department with altered mental status.  Patient last normal Sunday evening.  Patient cared for at home by her elderly husband.  She is typically alert, oriented, ambulatory.  She has been laying in bed all yesterday and most of this morning.  EMS is unsure regarding medication compliance.  They report that the patient's husband has some limited abilities at home to care for the patient and that she was found soaked in urine. CBG 209 per EMS.   Patient denies any pain but her mental status limits my full history and review of systems.  Level 5 caveat applies.   Past Medical History:  Diagnosis Date  . Diabetes mellitus without complication ([place])   . Diabetic neuropathy ([place])   . Hypertension   . Stroke ([place])   . Urinary incontinence     Patient Active Problem List   Diagnosis Date Noted  . Acute encephalopathy 03/24/2019  . Hypotension 02/07/2019  . Slurred speech 11/17/2018  . Diabetic neuropathy ([place]) 11/17/2018  . AKI (acute kidney injury) ([place]) 11/17/2018  . Uncontrolled type 2 diabetes mellitus with hyperglycemia ([place]) 06/11/2018  . Hyperlipidemia 02/20/2016  . Essential hypertension, benign 10/10/2015  . Vitamin D deficiency 10/10/2015  . Class 1 obesity due to excess calories without serious comorbidity with body mass index (BMI) of 31.0 to 31.9 in adult 10/10/2015  . HIP PAIN 01/12/2009  . SPINAL STENOSIS 01/12/2009  . LOW BACK PAIN 01/12/2009  . SPONDYLOLYSIS 01/12/2009  . SHOULDER PAIN 10/04/2008  . Uncontrolled type 2 diabetes mellitus with complication, without Ja[place]on Suppes-term current use of insulin ([place]) 10/01/2008    Past Surgical History:  Procedure Laterality Date  . ABDOMINAL HYSTERECTOMY    .  APPENDECTOMY    . BREAST SURGERY     begnin tumor removed  . CATARACT EXTRACTION, BILATERAL      Allergies Codeine  Family History  Problem Relation Age of Onset  . CAD Mother   . Hypertension Mother   . Stroke Father   . Heart attack Brother   . Cancer Brother     Social History Social History   Tobacco Use  . Smoking status: Never Smoker  . Smokeless tobacco: Never Used  Substance Use Topics  . Alcohol use: No  . Drug use: No    Review of Systems  Level 5 caveat: Confusion.   ____________________________________________   PHYSICAL EXAM:  VITAL SIGNS: Vitals:   03/24/19 1230 03/24/19 1330  BP: (!) 190/90 (!) 170/77  Pulse: 92 88  Resp: (!) 21 (!) 23  Temp:    SpO2: 97% 97%     Constitutional: Alert but confused. Following commands.  Eyes: Conjunctivae are normal. PERRL.  Head: Atraumatic. Nose: No congestion/rhinnorhea. Mouth/Throat: Mucous membranes are dry.  Neck: No stridor.   Cardiovascular: Normal rate, regular rhythm. Good peripheral circulation. Grossly normal heart sounds.   Respiratory: Normal respiratory effort.  No retractions. Lungs CTAB. Gastrointestinal: Soft and nontender. No distention.  Musculoskeletal: No gross deformities of extremities. Neurologic: Speech without obvious dysarthria but patient is confused which limits exam somewhat. No upper or lower extremity drift.  Skin:  Skin is warm and dry. Patient soaked in urine. Candidal type infection in the right inguinal crease. No  ulcerations. No abscess.   ____________________________________________   LABS (all labs ordered are listed, but only abnormal results are displayed)  Labs Reviewed  COMPREHENSIVE METABOLIC PANEL - Abnormal; Notable for the following components:      Result Value   Potassium 3.3 (*)    CO2 17 (*)    Glucose, Bld 232 (*)    BUN 6 (*)    Calcium 8.7 (*)    Albumin 3.3 (*)    Total Bilirubin 1.9 (*)    Anion gap 16 (*)    All other components within  normal limits  URINALYSIS, ROUTINE W REFLEX MICROSCOPIC - Abnormal; Notable for the following components:   Glucose, UA >=500 (*)    Hgb urine dipstick SMALL (*)    Ketones, ur 80 (*)    All other components within normal limits  CBG MONITORING, ED - Abnormal; Notable for the following components:   Glucose-Capillary 241 (*)    All other components within normal limits  URINE CULTURE  CBC  LACTIC ACID, PLASMA  LACTIC ACID, PLASMA  CK   ____________________________________________  RADIOLOGY  Dg Chest 2 View  Result Date: 03/24/2019 CLINICAL DATA:  Altered mental status EXAM: CHEST - 2 VIEW COMPARISON:  11/29/2018 FINDINGS: Heart and mediastinal contours are within normal limits. No focal opacities or effusions. No acute bony abnormality. IMPRESSION: No active cardiopulmonary disease. Electronically Signed   By: Rolm Baptise M.D.   On: 03/24/2019 12:22   Ct Head Wo Contrast  Result Date: 03/24/2019 CLINICAL DATA:  Slurred speech and LEFT facial droop. Altered mental status. EXAM: CT HEAD WITHOUT CONTRAST TECHNIQUE: Contiguous axial images were obtained from the base of the skull through the vertex without intravenous contrast. COMPARISON:  MR head 11/17/2018. FINDINGS: Brain: No evidence for acute infarction, hemorrhage, mass lesion, hydrocephalus, or extra-axial fluid. Advanced atrophy. Hypoattenuation of white matter, likely small vessel disease. Vascular: Calcification of the cavernous internal carotid arteries consistent with cerebrovascular atherosclerotic disease. No signs of intracranial large vessel occlusion. Skull: Normal. Negative for fracture or focal lesion. Sinuses/Orbits: No acute finding. Other: Compared with priors, similar appearance. IMPRESSION: Atrophy and small vessel disease similar to priors. No acute intracranial findings. Electronically Signed   By: Staci Righter M.D.   On: 03/24/2019 12:35    ____________________________________________   PROCEDURES   Procedure(s) performed:   Procedures  None  ____________________________________________   INITIAL IMPRESSION / ASSESSMENT AND PLAN / ED COURSE  Pertinent labs & imaging results that were available during my care of the patient were reviewed by me and considered in my medical decision making (see chart for details).   Patient presents to the emergency department with acute onset altered mental status.  She is following commands and has no apparent neurologic deficits.  She was found soaked in urine and has some candidal type yeast rash/skin breakdown mostly over the right inguinal crease.  No ulcerations or bedsores.  Lower suspicion for stroke or hemorrhage.  Abdomen is soft and nontender.  I do plan for CT imaging of the head along with chest x-ray.   01:30 PM  CT imaging reviewed with no acute findings.  Chest x-ray unremarkable.  Lab work is unremarkable.  Patient does have mild hyperglycemia and anion gap of 16 but doubt DKA.  Urinalysis with no evidence of infection.  Lactate normal.  No leukocytosis on CBC.  Plan for MRI and have added CK.  Will admit to the hospitalist with plan for stroke/AMS work-up.  Patient also on multiple medications  which can cause altered mental status including muscle relaxer and Klonopin.   Discussed patient's case with Hospitalist, Dr. Manuella Ghazi to request admission. Patient and family (if present) updated with plan. Care transferred to Hospitalist service.  I reviewed all nursing notes, vitals, pertinent old records, EKGs, labs, imaging (as available).  ____________________________________________  FINAL CLINICAL IMPRESSION(S) / ED DIAGNOSES  Final diagnoses:  Disorientation  Dehydration  Candidal skin infection    MEDICATIONS GIVEN DURING THIS VISIT:  Medications  sodium chloride flush (NS) 0.9 % injection 3 mL (has no administration in time range)  sodium chloride 0.9 % bolus 500 mL (0 mLs Intravenous Stopped 03/24/19 1310)   Note:  This document  was prepared using Dragon voice recognition software and may include unintentional dictation errors.  Nanda Quinton, MD Emergency Medicine    Briahnna Harries, [facility], MD 03/24/19 1341

## 2019-03-24 NOTE — ED Triage Notes (Signed)
Pt from home.  Family states pt has been in bed since Sunday evening and now altered. Pt is normally A/O  And walking.  CBG 209. VSS

## 2019-03-24 NOTE — H&P (Signed)
History and Physical    [name] TLX:[id number] DOB: 1929/09/20 DOA: 03/24/2019  PCP: Sharilyn Sites, MD   Patient coming from: Home  Chief Complaint: AMS  HPI: [name] is a 85 y.o. female with medical history significant for insulin-dependent diabetes mellitus, hypertension, prior CVA, dyslipidemia, and hypertension who was brought to the ED with altered mentation with last normal noted to be Sunday evening.  She is apparently cared at home by her elderly husband who also has memory issues and she is noted to typically be alert, conversational, and ambulatory.  She has apparently been laying in bed all day yesterday and most of this morning and was found soaked in urine.  CBG was noted to be 209 and there is uncertainty regarding medication compliance and patient is noted to be on a muscle relaxer and benzodiazepine which has been prescribed to her.   ED Course: Vital signs are stable with some blood pressure elevation noted and patient is afebrile.  Laboratory data with potassium of 3.3 lactic acid 1.5.  CT of the head with no acute findings and brain MRI with no signs of CVA.  Two-view chest x-ray with no acute findings.  Urine analysis with no signs of UTI noted.  Review of Systems: Cannot be obtained given patient condition  Past Medical History:  Diagnosis Date   Diabetes mellitus without complication ([place])    Diabetic neuropathy ([place])    Hypertension    Stroke [facility])    Urinary incontinence     Past Surgical History:  Procedure Laterality Date   ABDOMINAL HYSTERECTOMY     APPENDECTOMY     BREAST SURGERY     begnin tumor removed   CATARACT EXTRACTION, BILATERAL       reports that she has never smoked. She has never used smokeless tobacco. She reports that she does not drink alcohol or use drugs.  Allergies  Allergen Reactions   Codeine Nausea And Vomiting    Family History  Problem Relation Age of Onset   CAD Mother    Hypertension  Mother    Stroke Father    Heart attack Brother    Cancer Brother     Prior to Admission medications   Medication Sig Start Date End Date Taking? Authorizing Provider  Cholecalciferol (VITAMIN D3) 5000 units CAPS Take 1 capsule (5,000 Units total) by mouth daily. Patient taking differently: Take 5,000 Units by mouth every evening.  04/15/17  Yes Nida, Marella Chimes, MD  clonazePAM (KLONOPIN) 0.5 MG tablet Take 0.5 tablets (0.25 mg total) by mouth daily. Patient taking differently: Take 0.5 mg by mouth every evening.  10/07/18  Yes Tat, Eustace Quail, DO  Cyanocobalamin (B-12 PO) Take 1 tablet by mouth every evening.   Yes [provider]  furosemide (LASIX) 20 MG tablet Take 20 mg by mouth daily as needed (for fluid retention).    Yes [provider]  insulin glargine (LANTUS) 100 UNIT/ML injection Inject 0.25 mLs (25 Units total) into the skin 2 (two) times daily. Patient taking differently: Inject 24 Units into the skin 2 (two) times daily.  02/10/19  Yes Abbygael Curtiss D, DO  JANUVIA 50 MG tablet TAKE 1 TABLET BY MOUTH  DAILY Patient taking differently: Take 50 mg by mouth every evening.  05/01/18  Yes Nida, Marella Chimes, MD  metoprolol succinate (TOPROL-XL) 50 MG 24 hr tablet Take 50 mg by mouth at bedtime. Take with or immediately following a meal.    Yes [provider]  MYRBETRIQ 50 MG TB24 tablet Take 50 mg by mouth every evening.  09/02/14  Yes [provider]  nystatin (MYCOSTATIN/NYSTOP) powder Apply topically 2 (two) times daily. Apply to affected skin for treatment of fungal infection. Please keep area clean and dry. 11/30/18  Yes Barton Dubois, MD  simvastatin (ZOCOR) 20 MG tablet Take 20 mg by mouth every evening.   Yes [provider]  tiZANidine (ZANAFLEX) 2 MG tablet Take 4 mg by mouth at bedtime.    Yes [provider]  UNIFINE PENTIPS 31G X 8 MM MISC USE ONCE DAILY AS DIRECTED 03/05/19  Yes Cassandria Anger, MD     Physical Exam: Vitals:   03/24/19 1230 03/24/19 1330 03/24/19 1424 03/24/19 1454  BP: (!) 190/90 (!) 170/77 (!) 140/102 (!) 147/94  Pulse: 92 88 100 93  Resp: (!) 21 (!) 23  19  Temp:    98.4 F (36.9 C)  TempSrc:    Oral  SpO2: 97% 97% 97% 98%    Constitutional: NAD, calm, comfortable Vitals:   03/24/19 1230 03/24/19 1330 03/24/19 1424 03/24/19 1454  BP: (!) 190/90 (!) 170/77 (!) 140/102 (!) 147/94  Pulse: 92 88 100 93  Resp: (!) 21 (!) 23  19  Temp:    98.4 F (36.9 C)  TempSrc:    Oral  SpO2: 97% 97% 97% 98%   Eyes: lids and conjunctivae normal ENMT: Mucous membranes are moist.  Neck: normal, supple Respiratory: clear to auscultation bilaterally. Normal respiratory effort. No accessory muscle use.  Cardiovascular: Regular rate and rhythm, no murmurs. No extremity edema. Abdomen: no tenderness, no distention. Bowel sounds positive.  Musculoskeletal:  No joint deformity upper and lower extremities.   Skin: no rashes, lesions, ulcers.  Psychiatric: Cannot be performed given ams.  Labs on Admission: I have personally reviewed following labs and imaging studies  CBC: Recent Labs  Lab 03/24/19 1158  WBC 9.8  HGB 14.2  HCT 43.0  MCV 88.5  PLT 409   Basic Metabolic Panel: Recent Labs  Lab 03/24/19 1158  NA 137  K 3.3*  CL 104  CO2 17*  GLUCOSE 232*  BUN 6*  CREATININE 0.82  CALCIUM 8.7*   GFR: CrCl cannot be calculated (Unknown ideal weight.). Liver Function Tests: Recent Labs  Lab 03/24/19 1158  AST 18  ALT 14  ALKPHOS 79  BILITOT 1.9*  PROT 6.7  ALBUMIN 3.3*   No results for input(s): LIPASE, AMYLASE in the last 168 hours. No results for input(s): AMMONIA in the last 168 hours. Coagulation Profile: No results for input(s): INR, PROTIME in the last 168 hours. Cardiac Enzymes: No results for input(s): CKTOTAL, CKMB, CKMBINDEX, TROPONINI in the last 168 hours. BNP (last 3 results) No results for input(s): PROBNP in the last 8760  hours. HbA1C: No results for input(s): HGBA1C in the last 72 hours. CBG: Recent Labs  Lab 03/24/19 1139 03/24/19 1344  GLUCAP 241* 226*   Lipid Profile: No results for input(s): CHOL, HDL, LDLCALC, TRIG, CHOLHDL, LDLDIRECT in the last 72 hours. Thyroid Function Tests: No results for input(s): TSH, T4TOTAL, FREET4, T3FREE, THYROIDAB in the last 72 hours. Anemia Panel: No results for input(s): VITAMINB12, FOLATE, FERRITIN, TIBC, IRON, RETICCTPCT in the last 72 hours. Urine analysis:    Component Value Date/Time   COLORURINE YELLOW 03/24/2019 1136   APPEARANCEUR CLEAR 03/24/2019 1136   LABSPEC 1.020 03/24/2019 1136   PHURINE 5.0 03/24/2019 1136   GLUCOSEU >=500 (A) 03/24/2019 1136   HGBUR SMALL (A)  03/24/2019 [place] 03/24/2019 1136   KETONESUR 80 (A) 03/24/2019 1136   PROTEINUR NEGATIVE 03/24/2019 1136   UROBILINOGEN 0.2 09/06/2009 0135   NITRITE NEGATIVE 03/24/2019 1136   LEUKOCYTESUR NEGATIVE 03/24/2019 1136    Radiological Exams on Admission: Dg Chest 2 View  Result Date: 03/24/2019 CLINICAL DATA:  Altered mental status EXAM: CHEST - 2 VIEW COMPARISON:  11/29/2018 FINDINGS: Heart and mediastinal contours are within normal limits. No focal opacities or effusions. No acute bony abnormality. IMPRESSION: No active cardiopulmonary disease. Electronically Signed   By: Rolm Baptise M.D.   On: 03/24/2019 12:22   Ct Head Wo Contrast  Result Date: 03/24/2019 CLINICAL DATA:  Slurred speech and LEFT facial droop. Altered mental status. EXAM: CT HEAD WITHOUT CONTRAST TECHNIQUE: Contiguous axial images were obtained from the base of the skull through the vertex without intravenous contrast. COMPARISON:  MR head 11/17/2018. FINDINGS: Brain: No evidence for acute infarction, hemorrhage, mass lesion, hydrocephalus, or extra-axial fluid. Advanced atrophy. Hypoattenuation of white matter, likely small vessel disease. Vascular: Calcification of the cavernous internal carotid  arteries consistent with cerebrovascular atherosclerotic disease. No signs of intracranial large vessel occlusion. Skull: Normal. Negative for fracture or focal lesion. Sinuses/Orbits: No acute finding. Other: Compared with priors, similar appearance. IMPRESSION: Atrophy and small vessel disease similar to priors. No acute intracranial findings. Electronically Signed   By: Staci Righter M.D.   On: 03/24/2019 12:35   Mr Brain Wo Contrast  Result Date: 03/24/2019 CLINICAL DATA:  Altered mental status. EXAM: MRI HEAD WITHOUT CONTRAST TECHNIQUE: Multiplanar, multiecho pulse sequences of the brain and surrounding structures were obtained without intravenous contrast. COMPARISON:  Head CT 03/24/2019 and MRI 11/17/2018 FINDINGS: The study is motion degraded throughout with some sequences being moderately to severely degraded. Brain: Within limitations of motion, no acute infarct or intracranial hemorrhage is identified. There is no midline shift or extra-axial fluid collection. Cerebral atrophy is unchanged from the prior MRI. Periventricular white matter T2 hyperintensities are also unchanged and nonspecific but compatible with mild chronic small vessel ischemic disease. Vascular: Major intracranial vascular flow voids are preserved. Skull and upper cervical spine: Unremarkable bone marrow signal. Sinuses/Orbits: Bilateral cataract extraction. Mild scattered mucosal thickening in the paranasal sinuses. No significant mastoid fluid. Other: None. IMPRESSION: 1. Motion degraded examination without evidence of acute intracranial abnormality. 2. Mild chronic small vessel ischemic disease. Electronically Signed   By: Logan Bores M.D.   On: 03/24/2019 14:32    Assessment/Plan Principal Problem:   Acute encephalopathy Active Problems:   Essential hypertension, benign   Hyperlipidemia   Uncontrolled type 2 diabetes mellitus with hyperglycemia ([place])     1. Acute encephalopathy likely secondary to polypharmacy.   Medication compliance is uncertain especially since her caregiver who is her husband also appears to have memory issues.  Will withhold her Xanax as well as muscle relaxers and see if there is an improvement by a.m.  Otherwise, will consider other lab work with TSH and ammonia levels at that time. 2. Mild hypokalemia.  Replete and recheck levels as well as magnesium in a.m. 3. Candidiasis under pannus.  Start nystatin powder. 4. Essential hypertension-elevated.  Continue blood pressure agents with hydralazine as needed for severe elevations. 5. Dyslipidemia.  Hold statin for now check CK levels. 6. Hyperglycemia in the setting of type 2 diabetes.  Continue home insulin and add sliding scale and maintain on carb modified diet.   DVT prophylaxis: Lovenox Code Status: Full Family Communication: None at bedside Disposition  Plan:Admit for encephalopathy evaluation Consults called:None Admission status: Inpatient, Tele   [facility] Pager [phone]  If 7PM-7AM, please contact night-coverage [email or web address] Password [facility]  03/24/2019, 3:14 PM

## 2019-03-24 NOTE — ED Notes (Addendum)
ED TO INPATIENT HANDOFF REPORT  ED Nurse Name and Phone #: Clayton Bibles [phone]  S Name/Age/Gender Santa Lighter 85 y.o. female Room/Bed: APA14/APA14  Code Status   Code Status: Prior  Home/SNF/Other Home Patient oriented to: self Is this baseline? No   Triage Complete: Triage complete  Chief Complaint Altered Mental Status  Triage Note Pt from home.  Family states pt has been in bed since Sunday evening and now altered. Pt is normally A/O  And walking.  CBG 209. VSS   Allergies Allergies  Allergen Reactions  . Codeine Nausea And Vomiting    Level of Care/Admitting Diagnosis ED Disposition    ED Disposition Condition [facility] Area: [facility] [[id number]]  Level of Care: Telemetry [5]  Diagnosis: Acute encephalopathy [[id number]]  Admitting Physician: Rodena Goldmann [[id number]]  Attending Physician: Rodena Goldmann [[id number]]  Estimated length of stay: past midnight tomorrow  Certification:: I certify this patient will need inpatient services for at least 2 midnights  PT Class (Do Not Modify): Inpatient [101]  PT Acc Code (Do Not Modify): Private [1]       B Medical/Surgery History Past Medical History:  Diagnosis Date  . Diabetes mellitus without complication ([place])   . Diabetic neuropathy ([place])   . Hypertension   . Stroke ([place])   . Urinary incontinence    Past Surgical History:  Procedure Laterality Date  . ABDOMINAL HYSTERECTOMY    . APPENDECTOMY    . BREAST SURGERY     begnin tumor removed  . CATARACT EXTRACTION, BILATERAL       A IV Location/Drains/Wounds Patient Lines/Drains/Airways Status   Active Line/Drains/Airways    Name:   Placement date:   Placement time:   Site:   Days:   Peripheral IV 03/24/19 Left Forearm   03/24/19    1151    Forearm   less than 1   Peripheral IV 03/24/19 Right Hand   03/24/19    1151    Hand   less than 1   External Urinary Catheter   02/10/19    0845    -   42           Intake/Output Last 24 hours No intake or output data in the 24 hours ending 03/24/19 1339  Labs/Imaging Results for orders placed or performed during the hospital encounter of 03/24/19 (from the past 48 hour(s))  Urinalysis, Routine w reflex microscopic     Status: Abnormal   Collection Time: 03/24/19 11:36 AM  Result Value Ref Range   Color, Urine YELLOW YELLOW   APPearance CLEAR CLEAR   Specific Gravity, Urine 1.020 1.005 - 1.030   pH 5.0 5.0 - 8.0   Glucose, UA >=500 (A) NEGATIVE mg/dL   Hgb urine dipstick SMALL (A) NEGATIVE   Bilirubin Urine NEGATIVE NEGATIVE   Ketones, ur 80 (A) NEGATIVE mg/dL   Protein, ur NEGATIVE NEGATIVE mg/dL   Nitrite NEGATIVE NEGATIVE   Leukocytes,Ua NEGATIVE NEGATIVE   RBC / HPF 0-5 0 - 5 RBC/hpf   WBC, UA 0-5 0 - 5 WBC/hpf   Bacteria, UA NONE SEEN NONE SEEN   Squamous Epithelial / LPF 0-5 0 - 5    Comment: Performed at [facility], [address] [place], [place] [address]  CBG monitoring, ED     Status: Abnormal   Collection Time: 03/24/19 11:39 AM  Result Value Ref Range   Glucose-Capillary 241 (H) 70 - 99 mg/dL  CBC  Status: None   Collection Time: 03/24/19 11:58 AM  Result Value Ref Range   WBC 9.8 4.0 - 10.5 K/uL   RBC 4.86 3.87 - 5.11 MIL/uL   Hemoglobin 14.2 12.0 - 15.0 g/dL   HCT 43.0 36.0 - 46.0 %   MCV 88.5 80.0 - 100.0 fL   MCH 29.2 26.0 - 34.0 pg   MCHC 33.0 30.0 - 36.0 g/dL   RDW 13.1 11.5 - 15.5 %   Platelets 233 150 - 400 K/uL   nRBC 0.0 0.0 - 0.2 %    Comment: Performed at [facility], [address], [place], [place] [address]  Lactic acid, plasma     Status: None   Collection Time: 03/24/19 11:58 AM  Result Value Ref Range   Lactic Acid, Venous 1.5 0.5 - 1.9 mmol/L    Comment: Performed at [facility], [address] [place], [place] [address]  Comprehensive metabolic panel     Status: Abnormal   Collection Time: 03/24/19 11:58 AM  Result Value Ref Range   Sodium 137 135 - 145 mmol/L   Potassium 3.3 (L) 3.5  - 5.1 mmol/L   Chloride 104 98 - 111 mmol/L   CO2 17 (L) 22 - 32 mmol/L   Glucose, Bld 232 (H) 70 - 99 mg/dL   BUN 6 (L) 8 - 23 mg/dL   Creatinine, Ser 0.82 0.44 - 1.00 mg/dL   Calcium 8.7 (L) 8.9 - 10.3 mg/dL   Total Protein 6.7 6.5 - 8.1 g/dL   Albumin 3.3 (L) 3.5 - 5.0 g/dL   AST 18 15 - 41 U/L   ALT 14 0 - 44 U/L   Alkaline Phosphatase 79 38 - 126 U/L   Total Bilirubin 1.9 (H) 0.3 - 1.2 mg/dL   GFR calc non Af Amer >60 >60 mL/min   GFR calc Af Amer >60 >60 mL/min   Anion gap 16 (H) 5 - 15    Comment: Performed at [facility], [address], [place], [place] [address]   Dg Chest 2 View  Result Date: 03/24/2019 CLINICAL DATA:  Altered mental status EXAM: CHEST - 2 VIEW COMPARISON:  11/29/2018 FINDINGS: Heart and mediastinal contours are within normal limits. No focal opacities or effusions. No acute bony abnormality. IMPRESSION: No active cardiopulmonary disease. Electronically Signed   By: Rolm Baptise M.D.   On: 03/24/2019 12:22   Ct Head Wo Contrast  Result Date: 03/24/2019 CLINICAL DATA:  Slurred speech and LEFT facial droop. Altered mental status. EXAM: CT HEAD WITHOUT CONTRAST TECHNIQUE: Contiguous axial images were obtained from the base of the skull through the vertex without intravenous contrast. COMPARISON:  MR head 11/17/2018. FINDINGS: Brain: No evidence for acute infarction, hemorrhage, mass lesion, hydrocephalus, or extra-axial fluid. Advanced atrophy. Hypoattenuation of white matter, likely small vessel disease. Vascular: Calcification of the cavernous internal carotid arteries consistent with cerebrovascular atherosclerotic disease. No signs of intracranial large vessel occlusion. Skull: Normal. Negative for fracture or focal lesion. Sinuses/Orbits: No acute finding. Other: Compared with priors, similar appearance. IMPRESSION: Atrophy and small vessel disease similar to priors. No acute intracranial findings. Electronically Signed   By: Staci Righter M.D.   On: 03/24/2019  12:35    Pending Labs Unresulted Labs (From admission, onward)    Start     Ordered   03/24/19 1325  CK  ONCE - STAT,   STAT     03/24/19 1325   03/24/19 1136  Urine culture  ONCE - STAT,   STAT  Question:  Patient immune status  Answer:  Normal   03/24/19 1135   03/24/19 1131  Lactic acid, plasma  Now then every 2 hours,   STAT     03/24/19 1130          Vitals/Pain Today's Vitals   03/24/19 1145 03/24/19 1200 03/24/19 1230 03/24/19 1330  BP: (!) 184/85 (!) 178/89 (!) 190/90 (!) 170/77  Pulse: 98 98 92 88  Resp: 16 17 (!) 21 (!) 23  Temp:      TempSrc:      SpO2: 97% 96% 97% 97%    Isolation Precautions No active isolations  Medications Medications  sodium chloride flush (NS) 0.9 % injection 3 mL (has no administration in time range)  sodium chloride 0.9 % bolus 500 mL (0 mLs Intravenous Stopped 03/24/19 1310)    Mobility non-ambulatory High fall risk   Focused Assessments Neuro Assessment Handoff:  Swallow screen pass? n/a     Last date known well: 03/22/19   Neuro Assessment: Exceptions to WDL Neuro Checks:      Last Documented NIHSS Modified Score:   Has TPA been given? No If patient is a Neuro Trauma and patient is going to OR before floor call report to [place] nurse: [phone] or [phone]     R Recommendations: See Admitting Provider Note  Report given to:Autumn,RN   Additional Notes: lower abd yeast infection, purewick in place, diabetic

## 2019-03-25 LAB — CBC
HCT: 43.8 % (ref 36.0–46.0)
Hemoglobin: 14.3 g/dL (ref 12.0–15.0)
MCH: 29.4 pg (ref 26.0–34.0)
MCHC: 32.6 g/dL (ref 30.0–36.0)
MCV: 90.1 fL (ref 80.0–100.0)
Platelets: 222 10*3/uL (ref 150–400)
RBC: 4.86 MIL/uL (ref 3.87–5.11)
RDW: 13 % (ref 11.5–15.5)
WBC: 9.4 10*3/uL (ref 4.0–10.5)
nRBC: 0 % (ref 0.0–0.2)

## 2019-03-25 LAB — BASIC METABOLIC PANEL
Anion gap: 10 (ref 5–15)
BUN: 9 mg/dL (ref 8–23)
CO2: 22 mmol/L (ref 22–32)
Calcium: 9.1 mg/dL (ref 8.9–10.3)
Chloride: 107 mmol/L (ref 98–111)
Creatinine, Ser: 0.73 mg/dL (ref 0.44–1.00)
GFR calc Af Amer: >60 mL/min (ref >60)
GFR calc non Af Amer: >60 mL/min (ref >60)
Glucose, Bld: 160 mg/dL — ABNORMAL HIGH (ref 70–99)
Potassium: 3.6 mmol/L (ref 3.5–5.1)
Sodium: 139 mmol/L (ref 135–145)

## 2019-03-25 LAB — URINE CULTURE
Culture: NO GROWTH
Special Requests: NORMAL

## 2019-03-25 LAB — GLUCOSE, CAPILLARY
Glucose-Capillary: 154 mg/dL — ABNORMAL HIGH (ref 70–99)
Glucose-Capillary: 129 mg/dL — ABNORMAL HIGH (ref 70–99)
Glucose-Capillary: 137 mg/dL — ABNORMAL HIGH (ref 70–99)
Glucose-Capillary: 150 mg/dL — ABNORMAL HIGH (ref 70–99)

## 2019-03-25 LAB — MAGNESIUM: Magnesium: 1.2 mg/dL — ABNORMAL LOW (ref 1.7–2.4)

## 2019-03-25 MED ORDER — MAGNESIUM SULFATE 2 GM/50ML IV SOLN
2.0000 g | Freq: Once | INTRAVENOUS | Status: AC
  Administered 2019-03-25: 2 g via INTRAVENOUS
  Filled 2019-03-25: qty 50

## 2019-03-25 MED ORDER — ENSURE ENLIVE PO LIQD
237.0000 mL | Freq: Three times a day (TID) | ORAL | Status: DC
  Administered 2019-03-25 – 2019-03-26 (×4): 237 mL via ORAL

## 2019-03-25 MED ORDER — SODIUM CHLORIDE 0.9 % IV SOLN
INTRAVENOUS | Status: DC | PRN
  Administered 2019-03-25: 250 mL via INTRAVENOUS

## 2019-03-25 MED ORDER — SIMVASTATIN 20 MG PO TABS
20.0000 mg | ORAL_TABLET | Freq: Every evening | ORAL | Status: DC
  Administered 2019-03-25: 20 mg via ORAL
  Filled 2019-03-25: qty 1

## 2019-03-25 NOTE — Progress Notes (Signed)
PROGRESS NOTE    [name]  XQJ:[id number] DOB: 07/25/1929 DOA: 03/24/2019 PCP: Sharilyn Sites, MD   Brief Narrative:  Per HPI: [name] is a 85 y.o. female with medical history significant for insulin-dependent diabetes mellitus, hypertension, prior CVA, dyslipidemia, and hypertension who was brought to the ED with altered mentation with last normal noted to be Sunday evening.  She is apparently cared at home by her elderly husband who also has memory issues and she is noted to typically be alert, conversational, and ambulatory.  She has apparently been laying in bed all day yesterday and most of this morning and was found soaked in urine.  CBG was noted to be 209 and there is uncertainty regarding medication compliance and patient is noted to be on a muscle relaxer and benzodiazepine which has been prescribed to her.  Patient was admitted with acute encephalopathy suspected to be related to polypharmacy and poor medication compliance.  She continues to remain confused this morning.  Assessment & Plan:   Principal Problem:   Acute encephalopathy Active Problems:   Essential hypertension, benign   Hyperlipidemia   Uncontrolled type 2 diabetes mellitus with hyperglycemia ([place])  1. Acute encephalopathy likely secondary to polypharmacy.  Medication compliance is uncertain especially since her caregiver who is her husband also appears to have memory issues.  Will withhold her Xanax as well as muscle relaxers and see if there is an improvement by a.m. ammonia within normal limits.  TSH pending.  MRI negative.  PT evaluation today with likely need for placement. 2. Mild hypokalemia-resolved.    Recheck labs in a.m. 3. Hypomagnesemia.  Replete with magnesium sulfate fate and recheck in a.m. 4. Candidiasis under pannus.    Continue nystatin powder. 5. Essential hypertension-elevated.  Continue blood pressure agents with hydralazine as needed for severe  elevations. 6. Dyslipidemia.  CK within normal limits.  Restart statin. 7. Hyperglycemia in the setting of type 2 diabetes.  Better controlled on current regimen.  DVT prophylaxis:Lovenox Code Status: Full Family Communication: None at bedside; spoke with husband on phone on 3/31 Disposition Plan: Continue monitoring for improvement of AMS; replace Mg and hold CNS agents. PT eval pending with likely need for placement   Consultants:   None  Procedures:   None  Antimicrobials:   None   Subjective: Patient seen and evaluated today with no new acute complaints or concerns. No acute concerns or events noted overnight.  She continues to remain very confused and is not oriented in any way.  Objective: Vitals:   03/24/19 2230 03/25/19 0224 03/25/19 0225 03/25/19 0631  BP: (!) 177/83 (!) 180/96 (!) 155/87 (!) 154/84  Pulse: 89 78 78 70  Resp: 18 18 18 16   Temp:  97.8 F (36.6 C)  98.2 F (36.8 C)  TempSrc:  Oral  Oral  SpO2: 98% 97% 97% 97%  Weight:        Intake/Output Summary (Last 24 hours) at 03/25/2019 0746 Last data filed at 03/25/2019 0648 Gross per 24 hour  Intake 611.31 ml  Output 400 ml  Net 211.31 ml   Filed Weights   03/24/19 1500  Weight: 94.3 kg    Examination:  General exam: Appears calm and comfortable  Respiratory system: Clear to auscultation. Respiratory effort normal. Cardiovascular system: S1 & S2 heard, RRR. No JVD, murmurs, rubs, gallops or clicks. No pedal edema. Gastrointestinal system: Abdomen is nondistended, soft and nontender. No organomegaly or masses felt. Normal bowel sounds heard. Central nervous system: Alert  and not oriented.  Extremities: Symmetric 5 x 5 power. Skin: No rashes, lesions or ulcers Psychiatry: Cannot be assessed given patient's condition.    Data Reviewed: I have personally reviewed following labs and imaging studies  CBC: Recent Labs  Lab 03/24/19 1158 03/25/19 0438  WBC 9.8 9.4  HGB 14.2 14.3  HCT 43.0  43.8  MCV 88.5 90.1  PLT 233 229   Basic Metabolic Panel: Recent Labs  Lab 03/24/19 1158 03/25/19 0438  NA 137 139  K 3.3* 3.6  CL 104 107  CO2 17* 22  GLUCOSE 232* 160*  BUN 6* 9  CREATININE 0.82 0.73  CALCIUM 8.7* 9.1  MG  --  1.2*   GFR: Estimated Creatinine Clearance: 59.5 mL/min (by C-G formula based on SCr of 0.73 mg/dL). Liver Function Tests: Recent Labs  Lab 03/24/19 1158  AST 18  ALT 14  ALKPHOS 79  BILITOT 1.9*  PROT 6.7  ALBUMIN 3.3*   No results for input(s): LIPASE, AMYLASE in the last 168 hours. Recent Labs  Lab 03/24/19 1607  AMMONIA <9*   Coagulation Profile: No results for input(s): INR, PROTIME in the last 168 hours. Cardiac Enzymes: Recent Labs  Lab 03/24/19 1509  CKTOTAL 57   BNP (last 3 results) No results for input(s): PROBNP in the last 8760 hours. HbA1C: No results for input(s): HGBA1C in the last 72 hours. CBG: Recent Labs  Lab 03/24/19 1139 03/24/19 1344 03/24/19 1648 03/24/19 2109 03/25/19 0720  GLUCAP 241* 226* 216* 175* 154*   Lipid Profile: No results for input(s): CHOL, HDL, LDLCALC, TRIG, CHOLHDL, LDLDIRECT in the last 72 hours. Thyroid Function Tests: Recent Labs    03/24/19 1158  TSH 4.137   Anemia Panel: No results for input(s): VITAMINB12, FOLATE, FERRITIN, TIBC, IRON, RETICCTPCT in the last 72 hours. Sepsis Labs: Recent Labs  Lab 03/24/19 1158 03/24/19 1509  LATICACIDVEN 1.5 1.6    No results found for this or any previous visit (from the past 240 hour(s)).       Radiology Studies: Dg Chest 2 View  Result Date: 03/24/2019 CLINICAL DATA:  Altered mental status EXAM: CHEST - 2 VIEW COMPARISON:  11/29/2018 FINDINGS: Heart and mediastinal contours are within normal limits. No focal opacities or effusions. No acute bony abnormality. IMPRESSION: No active cardiopulmonary disease. Electronically Signed   By: Rolm Baptise M.D.   On: 03/24/2019 12:22   Ct Head Wo Contrast  Result Date:  03/24/2019 CLINICAL DATA:  Slurred speech and LEFT facial droop. Altered mental status. EXAM: CT HEAD WITHOUT CONTRAST TECHNIQUE: Contiguous axial images were obtained from the base of the skull through the vertex without intravenous contrast. COMPARISON:  MR head 11/17/2018. FINDINGS: Brain: No evidence for acute infarction, hemorrhage, mass lesion, hydrocephalus, or extra-axial fluid. Advanced atrophy. Hypoattenuation of white matter, likely small vessel disease. Vascular: Calcification of the cavernous internal carotid arteries consistent with cerebrovascular atherosclerotic disease. No signs of intracranial large vessel occlusion. Skull: Normal. Negative for fracture or focal lesion. Sinuses/Orbits: No acute finding. Other: Compared with priors, similar appearance. IMPRESSION: Atrophy and small vessel disease similar to priors. No acute intracranial findings. Electronically Signed   By: Staci Righter M.D.   On: 03/24/2019 12:35   Mr Brain Wo Contrast  Result Date: 03/24/2019 CLINICAL DATA:  Altered mental status. EXAM: MRI HEAD WITHOUT CONTRAST TECHNIQUE: Multiplanar, multiecho pulse sequences of the brain and surrounding structures were obtained without intravenous contrast. COMPARISON:  Head CT 03/24/2019 and MRI 11/17/2018 FINDINGS: The study is motion degraded  throughout with some sequences being moderately to severely degraded. Brain: Within limitations of motion, no acute infarct or intracranial hemorrhage is identified. There is no midline shift or extra-axial fluid collection. Cerebral atrophy is unchanged from the prior MRI. Periventricular white matter T2 hyperintensities are also unchanged and nonspecific but compatible with mild chronic small vessel ischemic disease. Vascular: Major intracranial vascular flow voids are preserved. Skull and upper cervical spine: Unremarkable bone marrow signal. Sinuses/Orbits: Bilateral cataract extraction. Mild scattered mucosal thickening in the paranasal  sinuses. No significant mastoid fluid. Other: None. IMPRESSION: 1. Motion degraded examination without evidence of acute intracranial abnormality. 2. Mild chronic small vessel ischemic disease. Electronically Signed   By: Logan Bores M.D.   On: 03/24/2019 14:32        Scheduled Meds:  cholecalciferol  5,000 Units Oral Daily   enoxaparin (LOVENOX) injection  40 mg Subcutaneous Q24H   insulin aspart  0-15 Units Subcutaneous TID WC   insulin aspart  0-5 Units Subcutaneous QHS   insulin glargine  25 Units Subcutaneous BID   linagliptin  5 mg Oral Daily   metoprolol succinate  50 mg Oral QHS   mirabegron ER  50 mg Oral QPM   nystatin   Topical TID   simvastatin  20 mg Oral QPM   vitamin B-12  250 mcg Oral QPM   Continuous Infusions:  magnesium sulfate 1 - 4 g bolus IVPB       LOS: 1 day    Time spent: 30 minutes    Coby Antrobus Darleen Crocker, DO [facility] Pager [phone]  If 7PM-7AM, please contact night-coverage [email or web address] Password [facility] 03/25/2019, 7:46 AM

## 2019-03-25 NOTE — Evaluation (Addendum)
Physical Therapy Evaluation Patient Details Name: [name] MRN: [id number] DOB: [birth year] Today's Date: 03/25/2019   History of Present Illness  [name] is a 85 y.o. female with medical history significant for insulin-dependent diabetes mellitus, hypertension, prior CVA, dyslipidemia, and hypertension who was brought to the ED with altered mentation with last normal noted to be Sunday evening.  She is apparently cared at home by her elderly husband who also has memory issues and she is noted to typically be alert, conversational, and ambulatory.  She has apparently been laying in bed all day yesterday and most of this morning and was found soaked in urine.  CBG was noted to be 209 and there is uncertainty regarding medication compliance and patient is noted to be on a muscle relaxer and benzodiazepine which has been prescribed to her.    Clinical Impression  Patient at severe risk of falls due to BLE weakness and fatigue, limited to a few steps at bedside and required 2 person assist during sit to stands/transfers mostly due to apprehension/possible confusion.  Patient tolerated sitting up in chair after therapy - RN aware.  Patient will benefit from continued physical therapy in hospital and recommended venue below to increase strength, balance, endurance for safe ADLs and gait.    Follow Up Recommendations SNF    Equipment Recommendations  None recommended by PT    Recommendations for Other Services       Precautions / Restrictions Precautions Precautions: Fall Restrictions Weight Bearing Restrictions: No      Mobility  Bed Mobility Overal bed mobility: Needs Assistance Bed Mobility: Supine to Sit     Supine to sit: Mod assist;Max assist     General bed mobility comments: slow labored movement  Transfers Overall transfer level: Needs assistance Equipment used: Rolling walker (2 wheeled) Transfers: Sit to/from [facility] Sit to  Stand: Mod assist;Max assist Stand pivot transfers: Max assist;+2 physical assistance       General transfer comment: apprehensive to stand requiring 2 person assist  Ambulation/Gait Ambulation/Gait assistance: Max assist Gait Distance (Feet): 2 Feet Assistive device: Rolling walker (2 wheeled);2 person hand held assist Gait Pattern/deviations: Decreased step length - right;Decreased step length - left;Decreased stride length Gait velocity: slow   General Gait Details: limited to 2-3 side steps at bedside due to weakness, poor standing balance  Stairs            Wheelchair Mobility    Modified Rankin (Stroke Patients Only)       Balance Overall balance assessment: Needs assistance Sitting-balance support: Feet supported;No upper extremity supported Sitting balance-Leahy Scale: Fair     Standing balance support: Bilateral upper extremity supported;During functional activity Standing balance-Leahy Scale: Poor Standing balance comment: using RW                             Pertinent Vitals/Pain Pain Assessment: No/denies pain    Home Living Family/patient expects to be discharged to:: Private residence Living Arrangements: Spouse/significant other   Type of Home: House Home Access: Level entry     Home Layout: Two level Home Equipment: Environmental consultant - 2 wheels;Cane - single point;Wheelchair - manual Additional Comments: Patient is poor historian at this time, most info taken from previous admission    Prior Function Level of Independence: Needs assistance   Gait / Transfers Assistance Needed: short household distances using RW  ADL's / Homemaking Assistance Needed: assisted by spouse  Hand Dominance   Dominant Hand: Right    Extremity/Trunk Assessment   Upper Extremity Assessment Upper Extremity Assessment: Generalized weakness    Lower Extremity Assessment Lower Extremity Assessment: Generalized weakness    Cervical / Trunk  Assessment Cervical / Trunk Assessment: Normal  Communication   Communication: Expressive difficulties;Other (comment)(slow to answer questions, only repsonds to 50% of questions)  Cognition Arousal/Alertness: Awake/alert Behavior During Therapy: Anxious Overall Cognitive Status: No family/caregiver present to determine baseline cognitive functioning Area of Impairment: Memory                               General Comments: very apprehensive to transfer to chair      General Comments      Exercises     Assessment/Plan    PT Assessment Patient needs continued PT services  PT Problem List Decreased strength;Decreased activity tolerance;Decreased balance;Decreased mobility       PT Treatment Interventions Therapeutic exercise;Gait training;Stair training;Functional mobility training;Therapeutic activities;Patient/family education    PT Goals (Current goals can be found in the Care Plan section)  Acute Rehab PT Goals Patient Stated Goal: return home PT Goal Formulation: With patient Time For Goal Achievement: 04/08/19 Potential to Achieve Goals: Good    Frequency Min 3X/week   Barriers to discharge        Co-evaluation               AM-PAC PT "6 Clicks" Mobility  Outcome Measure Help needed turning from your back to your side while in a flat bed without using bedrails?: A Lot Help needed moving from lying on your back to sitting on the side of a flat bed without using bedrails?: A Lot Help needed moving to and from a bed to a chair (including a wheelchair)?: A Lot Help needed standing up from a chair using your arms (e.g., wheelchair or bedside chair)?: A Lot Help needed to walk in hospital room?: Total Help needed climbing 3-5 steps with a railing? : Total 6 Click Score: 10    End of Session Equipment Utilized During Treatment: Gait belt Activity Tolerance: Patient limited by fatigue;Treatment limited secondary to agitation Patient left: in  chair;with call bell/phone within reach;with chair alarm set Nurse Communication: Mobility status PT Visit Diagnosis: Unsteadiness on feet (R26.81);Other abnormalities of gait and mobility (R26.89);Muscle weakness (generalized) (M62.81)    Time: 7026-3785 PT Time Calculation (min) (ACUTE ONLY): 32 min   Charges:   PT Evaluation $PT Eval Moderate Complexity: 1 Mod PT Treatments $Therapeutic Activity: 23-37 mins        10:39 AM, 03/25/19 Lonell Grandchild, MPT Physical Therapist with [facility] 336 [phone] office [phone] mobile phone

## 2019-03-25 NOTE — Care Management Important Message (Signed)
Important Message  Patient Details  Name: [name] MRN: [id number] Date of Birth: May 27, 1929   Medicare Important Message Given:  Yes    Tommy Medal 03/25/2019, 3:40 PM

## 2019-03-25 NOTE — Plan of Care (Signed)
  Problem: Acute Rehab PT Goals(only PT should resolve) Goal: Pt Will Go Supine/Side To Sit Outcome: Progressing Flowsheets (Taken 03/25/2019 1041) Pt will go Supine/Side to Sit: with minimal assist Goal: Patient Will Transfer Sit To/From Stand Outcome: Progressing Flowsheets (Taken 03/25/2019 1041) Patient will transfer sit to/from stand: with minimal assist Goal: Pt Will Transfer Bed To Chair/Chair To Bed Outcome: Progressing Flowsheets (Taken 03/25/2019 1041) Pt will Transfer Bed to Chair/Chair to Bed: with min assist Goal: Pt Will Ambulate Outcome: Progressing Flowsheets (Taken 03/25/2019 1041) Pt will Ambulate: 25 feet; with minimal assist; with moderate assist; with rolling walker   10:42 AM, 03/25/19 Lonell Grandchild, MPT Physical Therapist with [facility] 336 [phone] office [phone] mobile phone

## 2019-03-25 NOTE — Care Management (Signed)
Patient is active with [facility] for nursing services.

## 2019-03-25 NOTE — Progress Notes (Signed)
Patient's B/P 185/73. Hydralazine (Apresoline) injection 10 mg Intravenous administered per PRN order. Will continue to monitor patient.

## 2019-03-25 NOTE — Progress Notes (Signed)
Nutrition Brief Note  Patient identified on the Malnutrition Screening Tool (MST) Report. She is from home and lives with her husband.   SNF is being recommended by PT. Patient is alone at the time of RD visit. She is unable to provide usual weight or describe what she normally eats.   History includes DM-2, HTN, CVA and she presented to ED with altered mental status and documented concern for the husbands ability to care for her appropriately.  Body weight ranging between 93-98 kg for most of the past 2 years. Ambulates with a rolling walker per PT.  Body mass index is 31.61 kg/m. Patient meets criteria for obese based on current BMI.   Current diet order is Heart Healthy/CHO modifed, patient intake approximately 0-50% of meals at this time. May benefit from supervision/cueing/assistance with feeding due to altered mental status.   Labs and medications reviewed.  BMP Latest Ref Rng & Units 03/25/2019 03/24/2019 02/12/2019  Glucose 70 - 99 mg/dL 160(H) 232(H) 341(H)  BUN 8 - 23 mg/dL 9 6(L) 32(H)  Creatinine 0.44 - 1.00 mg/dL 0.73 0.82 1.57(H)  BUN/Creat Ratio 12 - 28 - - -  Sodium 135 - 145 mmol/L 139 137 135  Potassium 3.5 - 5.1 mmol/L 3.6 3.3(L) 5.0  Chloride 98 - 111 mmol/L 107 104 101  CO2 22 - 32 mmol/L 22 17(L) 24  Calcium 8.9 - 10.3 mg/dL 9.1 8.7(L) 9.2     Plan: Add Ensure Enlive TID to support adequate oral intake while hospitalized. RD will continue to follow peripherally.    Colman Cater MS,RD,CSG,LDN Office: [phone] Pager: [phone]

## 2019-03-25 NOTE — NC FL2 (Signed)
[place] LEVEL OF CARE SCREENING TOOL     IDENTIFICATION  Patient Name: [name] Birthdate: June 13, 1929 Sex: female Admission Date (Current Location): 03/24/2019  [facility] [place] and [place] Number:  [employer] and Address:  [place] [address], [place]      Provider Number: [id number]  Attending Physician Name and Address:  Rodena Goldmann, DO  Relative Name and Phone Number:       Current Level of Care:   Recommended Level of Care:   Prior Approval Number:    Date Approved/Denied:   PASRR Number: [id number]  Discharge Plan: SNF    Current Diagnoses: Patient Active Problem List   Diagnosis Date Noted  . Acute encephalopathy 03/24/2019  . Hypotension 02/07/2019  . Slurred speech 11/17/2018  . Diabetic neuropathy ([place]) 11/17/2018  . AKI (acute kidney injury) ([place]) 11/17/2018  . Uncontrolled type 2 diabetes mellitus with hyperglycemia ([place]) 06/11/2018  . Hyperlipidemia 02/20/2016  . Essential hypertension, benign 10/10/2015  . Vitamin D deficiency 10/10/2015  . Class 1 obesity due to excess calories without serious comorbidity with body mass index (BMI) of 31.0 to 31.9 in adult 10/10/2015  . HIP PAIN 01/12/2009  . SPINAL STENOSIS 01/12/2009  . LOW BACK PAIN 01/12/2009  . SPONDYLOLYSIS 01/12/2009  . SHOULDER PAIN 10/04/2008  . Uncontrolled type 2 diabetes mellitus with complication, without long-term current use of insulin ([place]) 10/01/2008    Orientation RESPIRATION BLADDER Height & Weight     Self, Time, Situation, Place  Normal Incontinent Weight: 207 lb 14.3 oz (94.3 kg) Height:     BEHAVIORAL SYMPTOMS/MOOD NEUROLOGICAL BOWEL NUTRITION STATUS      Incontinent (heart healthy/carb modified )  AMBULATORY STATUS COMMUNICATION OF NEEDS Skin   Extensive Assist Verbally Normal                       Personal Care Assistance Level of Assistance  Bathing, Feeding, Dressing Bathing Assistance:  Limited assistance Feeding assistance: Independent Dressing Assistance: Limited assistance     Functional Limitations Info  Sight, Hearing, Speech Sight Info: Adequate Hearing Info: Adequate Speech Info: Adequate    SPECIAL CARE FACTORS FREQUENCY  PT (By licensed PT)     PT Frequency: 5x/week              Contractures Contractures Info: Not present    Additional Factors Info  Code Status, Allergies, Psychotropic Code Status Info: Full Code Allergies Info: Codeine Psychotropic Info: Klonopin         Current Medications (03/25/2019):  This is the current hospital active medication list Current Facility-Administered Medications  Medication Dose Route Frequency Provider Last Rate Last Dose  . 0.9 %  sodium chloride infusion   Intravenous PRN Heath Lark D, DO 10 mL/hr at 03/25/19 0834 250 mL at 03/25/19 0834  . acetaminophen (TYLENOL) tablet 650 mg  650 mg Oral Q6H PRN Manuella Ghazi, Pratik D, DO       Or  . acetaminophen (TYLENOL) suppository 650 mg  650 mg Rectal Q6H PRN Manuella Ghazi, Pratik D, DO      . cholecalciferol (VITAMIN D3) tablet 5,000 Units  5,000 Units Oral Daily Heath Lark D, DO   5,000 Units at 03/25/19 5277  . enoxaparin (LOVENOX) injection 40 mg  40 mg Subcutaneous Q24H Manuella Ghazi, Pratik D, DO   40 mg at 03/24/19 2214  . feeding supplement (ENSURE ENLIVE) (ENSURE ENLIVE) liquid 237 mL  237 mL Oral TID BM Manuella Ghazi,  Pratik D, DO      . hydrALAZINE (APRESOLINE) injection 10 mg  10 mg Intravenous Q4H PRN Manuella Ghazi, Pratik D, DO      . insulin aspart (novoLOG) injection 0-15 Units  0-15 Units Subcutaneous TID WC Shah, Pratik D, DO   2 Units at 03/25/19 1135  . insulin aspart (novoLOG) injection 0-5 Units  0-5 Units Subcutaneous QHS Manuella Ghazi, Pratik D, DO      . insulin glargine (LANTUS) injection 25 Units  25 Units Subcutaneous BID Heath Lark D, DO   25 Units at 03/25/19 1135  . linagliptin (TRADJENTA) tablet 5 mg  5 mg Oral Daily Manuella Ghazi, Pratik D, DO   5 mg at 03/25/19 8891  . metoprolol  succinate (TOPROL-XL) 24 hr tablet 50 mg  50 mg Oral QHS Shah, Pratik D, DO   50 mg at 03/24/19 2214  . mirabegron ER (MYRBETRIQ) tablet 50 mg  50 mg Oral QPM Shah, Pratik D, DO   50 mg at 03/24/19 1701  . nystatin (MYCOSTATIN/NYSTOP) topical powder   Topical TID Manuella Ghazi, Pratik D, DO      . ondansetron [facility]) tablet 4 mg  4 mg Oral Q6H PRN Manuella Ghazi, Pratik D, DO       Or  . ondansetron (ZOFRAN) injection 4 mg  4 mg Intravenous Q6H PRN Manuella Ghazi, Pratik D, DO      . simvastatin (ZOCOR) tablet 20 mg  20 mg Oral QPM Shah, Pratik D, DO      . vitamin B-12 (CYANOCOBALAMIN) tablet 250 mcg  250 mcg Oral QPM Manuella Ghazi, Pratik D, DO   250 mcg at 03/24/19 1701     Discharge Medications: Please see discharge summary for a list of discharge medications.  Relevant Imaging Results:  Relevant Lab Results:   Additional Information SSN [id number] Clydene Pugh, LCSW

## 2019-03-25 NOTE — TOC Initial Note (Signed)
Transition of Care [facility]) - Initial/Assessment Note    Patient Details  Name: [name] MRN: [id number] Date of Birth: 1929-04-12  [facility]) CM/SW Contact:    Ihor Gully, LCSW Phone Number: 03/25/2019, 12:58 PM  Clinical Narrative:                  Patient's son, Doren Custard, provided history.  He stated that they have tried twice in the past to get patient in to rehab and patient declined.  Patient self propels in her wheelchair. She fell a couple years ago and has not been the same since the fall. Doren Custard stated that he also notices a cognitive decline.  He stated that patient cannot transfer from to her wheelchair independently.  Patient has a walker that she does not use and a shower chair.  Patient is active with [facility]. It was discussed that patient is currently only oriented to self and family would have to make decision until she cleared. Doren Custard stated that he and his sister feel that SNF is necessary at this time.  Referrals faxed out to area facilities with score of 3-5.   Expected Discharge Plan: Skilled Nursing Facility Barriers to Discharge: No Barriers Identified   Patient Goals and CMS Choice Patient states their goals for this hospitalization and ongoing recovery are:: Family wants patient to go to short term rehab.  CMS Medicare.gov Compare Post Acute Care list provided to:: Other (Comment Required)(Patient is oriented to self currently. List was reviewed with son, Doren Custard. Visitors are not currently allowed in the hospital. ) Choice offered to / list presented to : NA  Expected Discharge Plan and Services Expected Discharge Plan: [place] In-house Referral: Clinical Social Work Discharge Planning Services: CM Consult   Living arrangements for the past 2 months: Single Family Home Expected Discharge Date: 03/26/19                     [facility] Agency: (active with [facility] )  Prior Living Arrangements/Services Living  arrangements for the past 2 months: Single Family Home Lives with:: Spouse Patient language and need for interpreter reviewed:: No Do you feel safe going back to the place where you live?: Yes      Need for Family Participation in Patient Care: Yes (Comment) Care giver support system in place?: Yes (comment) Current home services: Home RN Criminal Activity/Legal Involvement Pertinent to Current Situation/Hospitalization: No - Comment as needed  Activities of Daily Living Home Assistive Devices/Equipment: Other (Comment) ADL Screening (condition at time of admission) Patient's cognitive ability adequate to safely complete daily activities?: No Is the patient deaf or have difficulty hearing?: No Does the patient have difficulty seeing, even when wearing glasses/contacts?: No Does the patient have difficulty concentrating, remembering, or making decisions?: Yes Patient able to express need for assistance with ADLs?: No Does the patient have difficulty dressing or bathing?: Yes Independently performs ADLs?: No Communication: Needs assistance Is this a change from baseline?: Change from baseline, expected to last >3 days Dressing (OT): Needs assistance Is this a change from baseline?: Change from baseline, expected to last >3 days Grooming: Needs assistance Is this a change from baseline?: Change from baseline, expected to last >3 days Feeding: Needs assistance Is this a change from baseline?: Change from baseline, expected to last >3 days Bathing: Needs assistance Is this a change from baseline?: Change from baseline, expected to last >3 days Toileting: Needs assistance Is this a change from baseline?: Change from  baseline, expected to last >3days In/Out Bed: Needs assistance Is this a change from baseline?: Change from baseline, expected to last >3 days Walks in Home: Needs assistance Is this a change from baseline?: Change from baseline, expected to last >3 days Does the patient have  difficulty walking or climbing stairs?: Yes Weakness of Legs: Both Weakness of Arms/Hands: None  Permission Sought/Granted         Permission granted to share info w AGENCY: [employer], [facility], [employer]   Permission granted to share info w Relationship: son, Doren Custard, listed on chart     Emotional Assessment Appearance:: Appears stated age Attitude/Demeanor/Rapport: Unable to Assess Affect (typically observed): Unable to Assess Orientation: : Oriented to Self Alcohol / Substance Use: Not Applicable Psych Involvement: No (comment)  Admission diagnosis:  Dehydration [E86.0] Disorientation [R41.0] Candidal skin infection [B37.2] Patient Active Problem List   Diagnosis Date Noted  . Acute encephalopathy 03/24/2019  . Hypotension 02/07/2019  . Slurred speech 11/17/2018  . Diabetic neuropathy ([place]) 11/17/2018  . AKI (acute kidney injury) ([place]) 11/17/2018  . Uncontrolled type 2 diabetes mellitus with hyperglycemia ([place]) 06/11/2018  . Hyperlipidemia 02/20/2016  . Essential hypertension, benign 10/10/2015  . Vitamin D deficiency 10/10/2015  . Class 1 obesity due to excess calories without serious comorbidity with body mass index (BMI) of 31.0 to 31.9 in adult 10/10/2015  . HIP PAIN 01/12/2009  . SPINAL STENOSIS 01/12/2009  . LOW BACK PAIN 01/12/2009  . SPONDYLOLYSIS 01/12/2009  . SHOULDER PAIN 10/04/2008  . Uncontrolled type 2 diabetes mellitus with complication, without long-term current use of insulin ([place]) 10/01/2008   PCP:  Sharilyn Sites, MD Pharmacy:   [facility] [address] [address] Phone: [phone] Fax: [place], [place] [facility] [address] [place] [address] [place] [address] Phone: [phone] Fax: [phone]     Social Determinants of Health (SDOH) Interventions    Readmission Risk Interventions No flowsheet data found.

## 2019-03-26 DIAGNOSIS — M19042 Primary osteoarthritis, left hand: Secondary | ICD-10-CM | POA: Diagnosis not present

## 2019-03-26 DIAGNOSIS — I10 Essential (primary) hypertension: Secondary | ICD-10-CM | POA: Diagnosis not present

## 2019-03-26 DIAGNOSIS — M19032 Primary osteoarthritis, left wrist: Secondary | ICD-10-CM | POA: Diagnosis not present

## 2019-03-26 DIAGNOSIS — E785 Hyperlipidemia, unspecified: Secondary | ICD-10-CM | POA: Diagnosis not present

## 2019-03-26 DIAGNOSIS — I639 Cerebral infarction, unspecified: Secondary | ICD-10-CM | POA: Diagnosis not present

## 2019-03-26 DIAGNOSIS — G9341 Metabolic encephalopathy: Secondary | ICD-10-CM | POA: Diagnosis not present

## 2019-03-26 DIAGNOSIS — I69311 Memory deficit following cerebral infarction: Secondary | ICD-10-CM | POA: Diagnosis not present

## 2019-03-26 DIAGNOSIS — I69391 Dysphagia following cerebral infarction: Secondary | ICD-10-CM | POA: Diagnosis not present

## 2019-03-26 DIAGNOSIS — D72829 Elevated white blood cell count, unspecified: Secondary | ICD-10-CM | POA: Diagnosis not present

## 2019-03-26 DIAGNOSIS — Z7401 Bed confinement status: Secondary | ICD-10-CM | POA: Diagnosis not present

## 2019-03-26 DIAGNOSIS — E1165 Type 2 diabetes mellitus with hyperglycemia: Secondary | ICD-10-CM | POA: Diagnosis not present

## 2019-03-26 DIAGNOSIS — M6281 Muscle weakness (generalized): Secondary | ICD-10-CM | POA: Diagnosis not present

## 2019-03-26 DIAGNOSIS — G9340 Encephalopathy, unspecified: Secondary | ICD-10-CM | POA: Diagnosis not present

## 2019-03-26 DIAGNOSIS — R4182 Altered mental status, unspecified: Secondary | ICD-10-CM | POA: Diagnosis not present

## 2019-03-26 DIAGNOSIS — I69328 Other speech and language deficits following cerebral infarction: Secondary | ICD-10-CM | POA: Diagnosis not present

## 2019-03-26 DIAGNOSIS — F5102 Adjustment insomnia: Secondary | ICD-10-CM | POA: Diagnosis not present

## 2019-03-26 LAB — CBC
HCT: 44.7 % (ref 36.0–46.0)
Hemoglobin: 14.8 g/dL (ref 12.0–15.0)
MCH: 29.4 pg (ref 26.0–34.0)
MCHC: 33.1 g/dL (ref 30.0–36.0)
MCV: 88.9 fL (ref 80.0–100.0)
Platelets: 261 10*3/uL (ref 150–400)
RBC: 5.03 MIL/uL (ref 3.87–5.11)
RDW: 13.1 % (ref 11.5–15.5)
WBC: 10.5 10*3/uL (ref 4.0–10.5)
nRBC: 0 % (ref 0.0–0.2)

## 2019-03-26 LAB — MAGNESIUM: Magnesium: 1.7 mg/dL (ref 1.7–2.4)

## 2019-03-26 LAB — GLUCOSE, CAPILLARY
Glucose-Capillary: 128 mg/dL — ABNORMAL HIGH (ref 70–99)
Glucose-Capillary: 123 mg/dL — ABNORMAL HIGH (ref 70–99)

## 2019-03-26 LAB — BASIC METABOLIC PANEL
Anion gap: 12 (ref 5–15)
BUN: 14 mg/dL (ref 8–23)
CO2: 20 mmol/L — ABNORMAL LOW (ref 22–32)
Calcium: 9.2 mg/dL (ref 8.9–10.3)
Chloride: 106 mmol/L (ref 98–111)
Creatinine, Ser: 0.7 mg/dL (ref 0.44–1.00)
GFR calc Af Amer: >60 mL/min (ref >60)
GFR calc non Af Amer: >60 mL/min (ref >60)
Glucose, Bld: 148 mg/dL — ABNORMAL HIGH (ref 70–99)
Potassium: 3.8 mmol/L (ref 3.5–5.1)
Sodium: 138 mmol/L (ref 135–145)

## 2019-03-26 MED ORDER — ENSURE ENLIVE PO LIQD: 237.0000 mL | Freq: Three times a day (TID) | ORAL | 12 refills | Status: DC

## 2019-03-26 MED ORDER — AMLODIPINE BESYLATE 5 MG PO TABS
5.0000 mg | ORAL_TABLET | Freq: Every day | ORAL | Status: DC
  Administered 2019-03-26: 12:00:00 5 mg via ORAL
  Filled 2019-03-26: qty 1

## 2019-03-26 MED ORDER — AMLODIPINE BESYLATE 5 MG PO TABS: 5.0000 mg | ORAL_TABLET | Freq: Every day | ORAL | 0 refills | Status: DC

## 2019-03-26 NOTE — TOC Transition Note (Signed)
Transition of Care [facility]) - CM/SW Discharge Note   Patient Details  Name: [name] MRN: [id number] Date of Birth: 1929/06/05  Transition of Care [facility]) CM/SW Contact:  Ihor Gully, LCSW Phone Number: 03/26/2019, 2:50 PM   Clinical Narrative:    Patient will discharge to [facility]. Son made aware. EMS contacted for transport. LCSW Signing off.      Barriers to Discharge: No Barriers Identified   Patient Goals and CMS Choice Patient states their goals for this hospitalization and ongoing recovery are:: Family wants patient to go to short term rehab.  CMS Medicare.gov Compare Post Acute Care list provided to:: Other (Comment Required)(Patient is oriented to self currently. List was reviewed with son, Doren Custard. Visitors are not currently allowed in the hospital. ) Choice offered to / list presented to : NA  Discharge Placement                       Discharge Plan and Services In-house Referral: Clinical Social Work Discharge Planning Services: CM Consult                  [place] Agency: (active with [facility] )   Social Determinants of Health (SDOH) Interventions     Readmission Risk Interventions No flowsheet data found.

## 2019-03-26 NOTE — Discharge Summary (Signed)
Physician Discharge Summary  [name]:[id number] DOB: 06-20-1929 DOA: 03/24/2019  PCP: Sharilyn Sites, MD  Admit date: 03/24/2019  Discharge date: 03/26/2019  Admitted From:Home  Disposition:  SNF  Recommendations for Outpatient Follow-up:  1. Follow up with PCP in 1-2 weeks 2. Follow blood pressures to ensure stability on amlodipine 3. Continue to withhold Xanax and tizanidine  Home Health: None  Equipment/Devices: None  Discharge Condition: Stable  CODE STATUS: Full  Diet recommendation: Heart Healthy/carb modified  Brief/Interim Summary: Per HPI: [name] Boyte Harrelsonis a 85 y.o.femalewith medical history significant forinsulin-dependent diabetes mellitus, hypertension, prior CVA, dyslipidemia, and hypertension who was brought to the ED with altered mentation with last normal noted to be Sunday evening. She is apparently cared at home by her elderly husband who also has memory issues and she is noted to typically be alert, conversational, and ambulatory. She has apparently been laying in bed all day yesterday and most of this morning and was found soaked in urine. CBG was noted to be 209 and there is uncertainty regarding medication compliance and patient is noted to be on a muscle relaxer and benzodiazepine which has been prescribed to her.  Patient was admitted with acute encephalopathy suspected to be related to polypharmacy and poor medication compliance.  She continues to remain confused and does not engage very much in conversation, but son on the phone admits to this generally being the case and has been progressively worsening over the last several years.  She is, however much more awake and alert and does make eye contact.  I believe that we have reached maximum medical improvement at this point in time.  She should remain off Xanax and tizanidine indefinitely.  Amlodipine has been added for improved blood pressure readings.  Continue on other medications as  prior.  No other acute events noted during the course of this admission.  She is currently stable for discharge to skilled nursing facility.  Discharge Diagnoses:  Principal Problem:   Acute encephalopathy Active Problems:   Essential hypertension, benign   Hyperlipidemia   Uncontrolled type 2 diabetes mellitus with hyperglycemia ([place])  Principal discharge diagnosis: Acute toxic encephalopathy likely secondary to polypharmacy and medication noncompliance.  Discharge Instructions  Discharge Instructions    Diet - low sodium heart healthy   Complete by:  As directed    Increase activity slowly   Complete by:  As directed      Allergies as of 03/26/2019      Reactions   Codeine Nausea And Vomiting      Medication List    STOP taking these medications   clonazePAM 0.5 MG tablet Commonly known as:  KLONOPIN   furosemide 20 MG tablet Commonly known as:  LASIX   tiZANidine 2 MG tablet Commonly known as:  ZANAFLEX     TAKE these medications   amLODipine 5 MG tablet Commonly known as:  NORVASC Take 1 tablet (5 mg total) by mouth daily for 30 days.   B-12 PO Take 1 tablet by mouth every evening.   feeding supplement (ENSURE ENLIVE) Liqd Take 237 mLs by mouth 3 (three) times daily between meals.   insulin glargine 100 UNIT/ML injection Commonly known as:  LANTUS Inject 0.25 mLs (25 Units total) into the skin 2 (two) times daily. What changed:  how much to take   Januvia 50 MG tablet Generic drug:  sitaGLIPtin TAKE 1 TABLET BY MOUTH  DAILY What changed:    how much to take  when to  take this   metoprolol succinate 50 MG 24 hr tablet Commonly known as:  TOPROL-XL Take 50 mg by mouth at bedtime. Take with or immediately following a meal.   Myrbetriq 50 MG Tb24 tablet Generic drug:  mirabegron ER Take 50 mg by mouth every evening.   nystatin powder Commonly known as:  MYCOSTATIN/NYSTOP Apply topically 2 (two) times daily. Apply to affected skin for treatment  of fungal infection. Please keep area clean and dry.   simvastatin 20 MG tablet Commonly known as:  ZOCOR Take 20 mg by mouth every evening.   Unifine Pentips 31G X 8 MM Misc Generic drug:  Insulin Pen Needle USE ONCE DAILY AS DIRECTED   Vitamin D3 125 MCG (5000 UT) Caps Take 1 capsule (5,000 Units total) by mouth daily. What changed:  when to take this      Follow-up Information    Sharilyn Sites, MD Follow up in 1 week(s).   Specialty:  Family Medicine Contact information: [address] [place] [address] [phone]          Allergies  Allergen Reactions  . Codeine Nausea And Vomiting    Consultations:  None   Procedures/Studies: Dg Chest 2 View  Result Date: 03/24/2019 CLINICAL DATA:  Altered mental status EXAM: CHEST - 2 VIEW COMPARISON:  11/29/2018 FINDINGS: Heart and mediastinal contours are within normal limits. No focal opacities or effusions. No acute bony abnormality. IMPRESSION: No active cardiopulmonary disease. Electronically Signed   By: Rolm Baptise M.D.   On: 03/24/2019 12:22   Ct Head Wo Contrast  Result Date: 03/24/2019 CLINICAL DATA:  Slurred speech and LEFT facial droop. Altered mental status. EXAM: CT HEAD WITHOUT CONTRAST TECHNIQUE: Contiguous axial images were obtained from the base of the skull through the vertex without intravenous contrast. COMPARISON:  MR head 11/17/2018. FINDINGS: Brain: No evidence for acute infarction, hemorrhage, mass lesion, hydrocephalus, or extra-axial fluid. Advanced atrophy. Hypoattenuation of white matter, likely small vessel disease. Vascular: Calcification of the cavernous internal carotid arteries consistent with cerebrovascular atherosclerotic disease. No signs of intracranial large vessel occlusion. Skull: Normal. Negative for fracture or focal lesion. Sinuses/Orbits: No acute finding. Other: Compared with priors, similar appearance. IMPRESSION: Atrophy and small vessel disease similar to priors. No  acute intracranial findings. Electronically Signed   By: Staci Righter M.D.   On: 03/24/2019 12:35   Mr Brain Wo Contrast  Result Date: 03/24/2019 CLINICAL DATA:  Altered mental status. EXAM: MRI HEAD WITHOUT CONTRAST TECHNIQUE: Multiplanar, multiecho pulse sequences of the brain and surrounding structures were obtained without intravenous contrast. COMPARISON:  Head CT 03/24/2019 and MRI 11/17/2018 FINDINGS: The study is motion degraded throughout with some sequences being moderately to severely degraded. Brain: Within limitations of motion, no acute infarct or intracranial hemorrhage is identified. There is no midline shift or extra-axial fluid collection. Cerebral atrophy is unchanged from the prior MRI. Periventricular white matter T2 hyperintensities are also unchanged and nonspecific but compatible with mild chronic small vessel ischemic disease. Vascular: Major intracranial vascular flow voids are preserved. Skull and upper cervical spine: Unremarkable bone marrow signal. Sinuses/Orbits: Bilateral cataract extraction. Mild scattered mucosal thickening in the paranasal sinuses. No significant mastoid fluid. Other: None. IMPRESSION: 1. Motion degraded examination without evidence of acute intracranial abnormality. 2. Mild chronic small vessel ischemic disease. Electronically Signed   By: Logan Bores M.D.   On: 03/24/2019 14:32     Discharge Exam: Vitals:   03/26/19 0509 03/26/19 0645  BP: (!) 171/88 (!) 183/74  Pulse:  79 80  Resp: 20 20  Temp: 98 F (36.7 C) 98.3 F (36.8 C)  SpO2:  98%   Vitals:   03/25/19 2248 03/26/19 0239 03/26/19 0509 03/26/19 0645  BP: (!) 184/98 (!) 154/65 (!) 171/88 (!) 183/74  Pulse: 80 82 79 80  Resp: 20 16 20 20   Temp: 98.5 F (36.9 C) 98.4 F (36.9 C) 98 F (36.7 C) 98.3 F (36.8 C)  TempSrc: Oral Oral Oral Oral  SpO2: 99% 97%  98%  Weight:        General: Pt is alert, awake, not in acute distress Cardiovascular: RRR, S1/S2 +, no rubs, no  gallops Respiratory: CTA bilaterally, no wheezing, no rhonchi Abdominal: Soft, NT, ND, bowel sounds + Extremities: no edema, no cyanosis    The results of significant diagnostics from this hospitalization (including imaging, microbiology, ancillary and laboratory) are listed below for reference.     Microbiology: Recent Results (from the past 240 hour(s))  Urine culture     Status: None   Collection Time: 03/24/19 11:36 AM  Result Value Ref Range Status   Specimen Description   Final    URINE, CATHETERIZED Performed at [facility], [address] [place], [place] [address]    Special Requests   Final    Normal Performed at [facility], [address], [place], [place] [address]    Culture   Final    NO GROWTH Performed at [facility], [place] [address], [place],  [address]    Report Status 03/25/2019 FINAL  Final     Labs: BNP (last 3 results) No results for input(s): BNP in the last 8760 hours. Basic Metabolic Panel: Recent Labs  Lab 03/24/19 1158 03/25/19 0438 03/26/19 0459  NA 137 139 138  K 3.3* 3.6 3.8  CL 104 107 106  CO2 17* 22 20*  GLUCOSE 232* 160* 148*  BUN 6* 9 14  CREATININE 0.82 0.73 0.70  CALCIUM 8.7* 9.1 9.2  MG  --  1.2* 1.7   Liver Function Tests: Recent Labs  Lab 03/24/19 1158  AST 18  ALT 14  ALKPHOS 79  BILITOT 1.9*  PROT 6.7  ALBUMIN 3.3*   No results for input(s): LIPASE, AMYLASE in the last 168 hours. Recent Labs  Lab 03/24/19 1607  AMMONIA <9*   CBC: Recent Labs  Lab 03/24/19 1158 03/25/19 0438 03/26/19 0459  WBC 9.8 9.4 10.5  HGB 14.2 14.3 14.8  HCT 43.0 43.8 44.7  MCV 88.5 90.1 88.9  PLT 233 222 261   Cardiac Enzymes: Recent Labs  Lab 03/24/19 1509  CKTOTAL 57   BNP: Invalid input(s): POCBNP CBG: Recent Labs  Lab 03/25/19 0720 03/25/19 1129 03/25/19 1716 03/25/19 2121 03/26/19 0748  GLUCAP 154* 129* 137* 150* 128*   D-Dimer No results for input(s): DDIMER in the last 72 hours. Hgb  A1c No results for input(s): HGBA1C in the last 72 hours. Lipid Profile No results for input(s): CHOL, HDL, LDLCALC, TRIG, CHOLHDL, LDLDIRECT in the last 72 hours. Thyroid function studies Recent Labs    03/24/19 1158  TSH 4.137   Anemia work up No results for input(s): VITAMINB12, FOLATE, FERRITIN, TIBC, IRON, RETICCTPCT in the last 72 hours. Urinalysis    Component Value Date/Time   COLORURINE YELLOW 03/24/2019 1136   APPEARANCEUR CLEAR 03/24/2019 1136   LABSPEC 1.020 03/24/2019 1136   PHURINE 5.0 03/24/2019 1136   GLUCOSEU >=500 (A) 03/24/2019 1136   HGBUR SMALL (A) 03/24/2019 1136   BILIRUBINUR  NEGATIVE 03/24/2019 1136   KETONESUR 80 (A) 03/24/2019 1136   PROTEINUR NEGATIVE 03/24/2019 1136   UROBILINOGEN 0.2 09/06/2009 0135   NITRITE NEGATIVE 03/24/2019 1136   LEUKOCYTESUR NEGATIVE 03/24/2019 1136   Sepsis Labs Invalid input(s): PROCALCITONIN,  WBC,  LACTICIDVEN Microbiology Recent Results (from the past 240 hour(s))  Urine culture     Status: None   Collection Time: 03/24/19 11:36 AM  Result Value Ref Range Status   Specimen Description   Final    URINE, CATHETERIZED Performed at [facility], [address] [place], [place] [address]    Special Requests   Final    Normal Performed at [facility], [address] [place], [place] [address]    Culture   Final    NO GROWTH Performed at [facility], [place] [address] [place], [place] [address]    Report Status 03/25/2019 FINAL  Final     Time coordinating discharge: 35 minutes  SIGNED:   Rodena Goldmann, DO [facility] 03/26/2019, 10:34 AM  If 7PM-7AM, please contact night-coverage [email or web address] Password [email or web address]

## 2019-03-26 NOTE — Progress Notes (Signed)
Physical Therapy Treatment Patient Details Name: [name] MRN: [id number] DOB: 1929-03-15 Today's Date: 03/26/2019    History of Present Illness [name] is a 85 y.o. female with medical history significant for insulin-dependent diabetes mellitus, hypertension, prior CVA, dyslipidemia, and hypertension who was brought to the ED with altered mentation with last normal noted to be Sunday evening.  She is apparently cared at home by her elderly husband who also has memory issues and she is noted to typically be alert, conversational, and ambulatory.  She has apparently been laying in bed all day yesterday and most of this morning and was found soaked in urine.  CBG was noted to be 209 and there is uncertainty regarding medication compliance and patient is noted to be on a muscle relaxer and benzodiazepine which has been prescribed to her.    PT Comments    Pt supine in bed following respiratory treatment, willing to participate with therapy today.  Pt with decreased memory, able to state her name but unable to recall DOB.  Pt very anxious through session, positive encouraging words through session to encourage pt to participate with bed mobility and attempted transfer.  Max cueing and assistance with bed mobility for hand placement on rail to assist with sidelying to sitting.  Pt with delayed response with cueing as well.  Attempted sit to stand 2x, pt unable to stand fully erect due to weakness with 2 person Max A.  Pt anxious and acted fearful with attempts to stand, pt explained all precautions used for safety with 2+, gait belt and RW as well as instructions for handplacement to assist with transfer and safety.  Following 2 attempts pt requested to stay in bed.  Pt left in bed with call bell within reach and bed alarm set.  No reports of pain through session.   Follow Up Recommendations  SNF     Equipment Recommendations  None recommended by PT    Recommendations for Other  Services       Precautions / Restrictions Precautions Precautions: Fall Restrictions Weight Bearing Restrictions: No    Mobility  Bed Mobility Overal bed mobility: Needs Assistance Bed Mobility: Supine to Sit     Supine to sit: Mod assist;Max assist     General bed mobility comments: slow labored movement, required cueing for hand placement to assist with supine to sitting  Transfers Overall transfer level: Needs assistance Equipment used: Rolling walker (2 wheeled) Transfers: Sit to/from [facility] Sit to Stand: Max assist;+2 physical assistance         General transfer comment: apprehensive to stand requiring 2 person assist, attempted 2x, unable to stand fully erect and pt requested not to stand today  Ambulation/Gait                 Stairs             Wheelchair Mobility    Modified Rankin (Stroke Patients Only)       Balance                                            Cognition Arousal/Alertness: Awake/alert Behavior During Therapy: Anxious Overall Cognitive Status: No family/caregiver present to determine baseline cognitive functioning(Able to state name, unable to recall DOB) Area of Impairment: Memory  General Comments: very apprehensive to transfer to chair      Exercises      General Comments        Pertinent Vitals/Pain Pain Assessment: No/denies pain    Home Living                      Prior Function            PT Goals (current goals can now be found in the care plan section)      Frequency    Min 3X/week      PT Plan      Co-evaluation              AM-PAC PT "6 Clicks" Mobility   Outcome Measure  Help needed turning from your back to your side while in a flat bed without using bedrails?: A Lot Help needed moving from lying on your back to sitting on the side of a flat bed without using bedrails?: A Lot Help  needed moving to and from a bed to a chair (including a wheelchair)?: A Lot Help needed standing up from a chair using your arms (e.g., wheelchair or bedside chair)?: A Lot Help needed to walk in hospital room?: Total Help needed climbing 3-5 steps with a railing? : Total 6 Click Score: 10    End of Session Equipment Utilized During Treatment: Gait belt Activity Tolerance: Patient limited by fatigue;Treatment limited secondary to agitation Patient left: in bed;with call bell/phone within reach;with bed alarm set Nurse Communication: Mobility status PT Visit Diagnosis: Unsteadiness on feet (R26.81);Other abnormalities of gait and mobility (R26.89);Muscle weakness (generalized) (M62.81)     Time: 1610-9604 PT Time Calculation (min) (ACUTE ONLY): 22 min  Charges:  $Therapeutic Activity: 8-22 mins                     [address] LPTA; CBIS [phone]  [name] 03/26/2019, 10:04 AM

## 2019-03-26 NOTE — Progress Notes (Signed)
IV's removed and report called to Andee Poles, nurse at [facility] in [place].

## 2019-03-26 NOTE — Progress Notes (Signed)
Responsive to voice and can state name.  When asked if wants to talk to son, states "that will be alright." Otherwise does not talk or make requests.  Obeyed commands to squeeze fingers and raise arms etc this morning and weakly attempted this afternoon.  To be discharged to [facility] in [place] this afternoon.  Spoke with daughter , Butch Penny, by phone this morning

## 2019-03-30 DIAGNOSIS — G9341 Metabolic encephalopathy: Secondary | ICD-10-CM | POA: Diagnosis not present

## 2019-03-30 DIAGNOSIS — E1165 Type 2 diabetes mellitus with hyperglycemia: Secondary | ICD-10-CM | POA: Diagnosis not present

## 2019-03-30 DIAGNOSIS — I10 Essential (primary) hypertension: Secondary | ICD-10-CM | POA: Diagnosis not present

## 2019-03-30 DIAGNOSIS — E785 Hyperlipidemia, unspecified: Secondary | ICD-10-CM | POA: Diagnosis not present

## 2019-04-06 DIAGNOSIS — I10 Essential (primary) hypertension: Secondary | ICD-10-CM | POA: Diagnosis not present

## 2019-04-06 DIAGNOSIS — G9341 Metabolic encephalopathy: Secondary | ICD-10-CM | POA: Diagnosis not present

## 2019-04-06 DIAGNOSIS — E785 Hyperlipidemia, unspecified: Secondary | ICD-10-CM | POA: Diagnosis not present

## 2019-04-06 DIAGNOSIS — E1165 Type 2 diabetes mellitus with hyperglycemia: Secondary | ICD-10-CM | POA: Diagnosis not present

## 2019-04-08 ENCOUNTER — Ambulatory Visit: Payer: Medicare Other | Admitting: Orthopedic Surgery

## 2019-04-13 DIAGNOSIS — D72829 Elevated white blood cell count, unspecified: Secondary | ICD-10-CM | POA: Diagnosis not present

## 2019-04-13 DIAGNOSIS — E785 Hyperlipidemia, unspecified: Secondary | ICD-10-CM | POA: Diagnosis not present

## 2019-04-13 DIAGNOSIS — E1165 Type 2 diabetes mellitus with hyperglycemia: Secondary | ICD-10-CM | POA: Diagnosis not present

## 2019-04-13 DIAGNOSIS — I10 Essential (primary) hypertension: Secondary | ICD-10-CM | POA: Diagnosis not present

## 2019-04-15 DIAGNOSIS — E1165 Type 2 diabetes mellitus with hyperglycemia: Secondary | ICD-10-CM | POA: Diagnosis not present

## 2019-04-15 DIAGNOSIS — G9341 Metabolic encephalopathy: Secondary | ICD-10-CM | POA: Diagnosis not present

## 2019-04-15 DIAGNOSIS — E785 Hyperlipidemia, unspecified: Secondary | ICD-10-CM | POA: Diagnosis not present

## 2019-04-15 DIAGNOSIS — I10 Essential (primary) hypertension: Secondary | ICD-10-CM | POA: Diagnosis not present

## 2019-04-19 ENCOUNTER — Emergency Department (HOSPITAL_COMMUNITY)
Admission: EM | Admit: 2019-04-19 | Discharge: 2019-04-20 | Disposition: A | Discharge status: Home / Self Care | Payer: Medicare Other | Attending: Emergency Medicine | Admitting: Emergency Medicine

## 2019-04-19 ENCOUNTER — Other Ambulatory Visit: Payer: Self-pay

## 2019-04-19 ENCOUNTER — Encounter: Payer: Self-pay | Admitting: Orthopedic Surgery

## 2019-04-19 ENCOUNTER — Encounter (HOSPITAL_COMMUNITY): Payer: Self-pay | Admitting: *Deleted

## 2019-04-19 ENCOUNTER — Emergency Department (HOSPITAL_COMMUNITY): Payer: Medicare Other

## 2019-04-19 DIAGNOSIS — E119 Type 2 diabetes mellitus without complications: Secondary | ICD-10-CM | POA: Insufficient documentation

## 2019-04-19 DIAGNOSIS — Z79899 Other long term (current) drug therapy: Secondary | ICD-10-CM | POA: Insufficient documentation

## 2019-04-19 DIAGNOSIS — I4510 Unspecified right bundle-branch block: Secondary | ICD-10-CM | POA: Diagnosis not present

## 2019-04-19 DIAGNOSIS — R51 Headache: Secondary | ICD-10-CM | POA: Insufficient documentation

## 2019-04-19 DIAGNOSIS — Z794 Long term (current) use of insulin: Secondary | ICD-10-CM | POA: Diagnosis not present

## 2019-04-19 DIAGNOSIS — I10 Essential (primary) hypertension: Secondary | ICD-10-CM | POA: Insufficient documentation

## 2019-04-19 DIAGNOSIS — R519 Headache, unspecified: Secondary | ICD-10-CM

## 2019-04-19 MED ORDER — TETRACAINE HCL 0.5 % OP SOLN
2.0000 [drp] | Freq: Once | OPHTHALMIC | Status: AC
  Administered 2019-04-20: 2 [drp] via OPHTHALMIC
  Filled 2019-04-19: qty 4

## 2019-04-19 MED ORDER — FENTANYL CITRATE (PF) 100 MCG/2ML IJ SOLN
25.0000 ug | Freq: Once | INTRAMUSCULAR | Status: AC
  Administered 2019-04-20: 25 ug via INTRAVENOUS
  Filled 2019-04-19: qty 2

## 2019-04-19 MED ORDER — FLUORESCEIN SODIUM 1 MG OP STRP
1.0000 | ORAL_STRIP | Freq: Once | OPHTHALMIC | Status: AC
  Administered 2019-04-20: 1 via OPHTHALMIC
  Filled 2019-04-19: qty 1

## 2019-04-19 NOTE — ED Triage Notes (Signed)
Pt c/o headache that started yesterday and eased up some but started back this am; pt states she was just released from the hospital but doesn't know why she was in the hospital

## 2019-04-20 DIAGNOSIS — R51 Headache: Secondary | ICD-10-CM | POA: Diagnosis not present

## 2019-04-20 DIAGNOSIS — E1165 Type 2 diabetes mellitus with hyperglycemia: Secondary | ICD-10-CM | POA: Diagnosis not present

## 2019-04-20 DIAGNOSIS — G9341 Metabolic encephalopathy: Secondary | ICD-10-CM | POA: Diagnosis not present

## 2019-04-20 DIAGNOSIS — Z9114 Patient's other noncompliance with medication regimen: Secondary | ICD-10-CM | POA: Diagnosis not present

## 2019-04-20 DIAGNOSIS — E785 Hyperlipidemia, unspecified: Secondary | ICD-10-CM | POA: Diagnosis not present

## 2019-04-20 DIAGNOSIS — E1140 Type 2 diabetes mellitus with diabetic neuropathy, unspecified: Secondary | ICD-10-CM | POA: Diagnosis not present

## 2019-04-20 DIAGNOSIS — Z794 Long term (current) use of insulin: Secondary | ICD-10-CM | POA: Diagnosis not present

## 2019-04-20 DIAGNOSIS — M6281 Muscle weakness (generalized): Secondary | ICD-10-CM | POA: Diagnosis not present

## 2019-04-20 DIAGNOSIS — I69398 Other sequelae of cerebral infarction: Secondary | ICD-10-CM | POA: Diagnosis not present

## 2019-04-20 DIAGNOSIS — M1990 Unspecified osteoarthritis, unspecified site: Secondary | ICD-10-CM | POA: Diagnosis not present

## 2019-04-20 DIAGNOSIS — I10 Essential (primary) hypertension: Secondary | ICD-10-CM | POA: Diagnosis not present

## 2019-04-20 DIAGNOSIS — Z8673 Personal history of transient ischemic attack (TIA), and cerebral infarction without residual deficits: Secondary | ICD-10-CM | POA: Diagnosis not present

## 2019-04-20 LAB — TROPONIN I: Troponin I: <0.03 ng/mL (ref <0.03)

## 2019-04-20 LAB — CBC WITH DIFFERENTIAL/PLATELET
Abs Immature Granulocytes: 0.03 10*3/uL (ref 0.00–0.07)
Basophils Absolute: 0.1 10*3/uL (ref 0.0–0.1)
Basophils Relative: 1 %
Eosinophils Absolute: 0.2 10*3/uL (ref 0.0–0.5)
Eosinophils Relative: 2 %
HCT: 40.7 % (ref 36.0–46.0)
Hemoglobin: 13.5 g/dL (ref 12.0–15.0)
Immature Granulocytes: 0 %
Lymphocytes Relative: 23 %
Lymphs Abs: 2.3 10*3/uL (ref 0.7–4.0)
MCH: 30.3 pg (ref 26.0–34.0)
MCHC: 33.2 g/dL (ref 30.0–36.0)
MCV: 91.5 fL (ref 80.0–100.0)
Monocytes Absolute: 1 10*3/uL (ref 0.1–1.0)
Monocytes Relative: 10 %
Neutro Abs: 6.3 10*3/uL (ref 1.7–7.7)
Neutrophils Relative %: 64 %
Platelets: 264 10*3/uL (ref 150–400)
RBC: 4.45 MIL/uL (ref 3.87–5.11)
RDW: 13.2 % (ref 11.5–15.5)
WBC: 9.9 10*3/uL (ref 4.0–10.5)
nRBC: 0 % (ref 0.0–0.2)

## 2019-04-20 LAB — BASIC METABOLIC PANEL
Anion gap: 10 (ref 5–15)
BUN: 16 mg/dL (ref 8–23)
CO2: 24 mmol/L (ref 22–32)
Calcium: 9.2 mg/dL (ref 8.9–10.3)
Chloride: 103 mmol/L (ref 98–111)
Creatinine, Ser: 0.84 mg/dL (ref 0.44–1.00)
GFR calc Af Amer: >60 mL/min (ref >60)
GFR calc non Af Amer: >60 mL/min (ref >60)
Glucose, Bld: 208 mg/dL — ABNORMAL HIGH (ref 70–99)
Potassium: 4.1 mmol/L (ref 3.5–5.1)
Sodium: 137 mmol/L (ref 135–145)

## 2019-04-20 LAB — SEDIMENTATION RATE: Sed Rate: 12 mm/hr (ref 0–22)

## 2019-04-20 MED ORDER — PROCHLORPERAZINE EDISYLATE 10 MG/2ML IJ SOLN: 5.0000 mg | Freq: Once | INTRAMUSCULAR | Status: DC

## 2019-04-20 MED ORDER — KETOROLAC TROMETHAMINE 30 MG/ML IJ SOLN
15.0000 mg | Freq: Once | INTRAMUSCULAR | Status: AC
  Administered 2019-04-20: 15 mg via INTRAVENOUS
  Filled 2019-04-20: qty 1

## 2019-04-20 MED ORDER — FENTANYL CITRATE (PF) 100 MCG/2ML IJ SOLN
25.0000 ug | Freq: Once | INTRAMUSCULAR | Status: AC
  Administered 2019-04-20: 25 ug via INTRAVENOUS
  Filled 2019-04-20: qty 2

## 2019-04-20 MED ORDER — PROCHLORPERAZINE EDISYLATE 10 MG/2ML IJ SOLN
5.0000 mg | Freq: Once | INTRAMUSCULAR | Status: AC
  Administered 2019-04-20: 5 mg via INTRAVENOUS
  Filled 2019-04-20: qty 2

## 2019-04-20 NOTE — Discharge Instructions (Signed)
Your testing is negative for serious cause of your headache.  Your eye pressures are normal.  Follow-up with your doctor for a recheck this week.  Return to the ED if develop worsening pain, focal weakness, difficulty speaking, difficulty swallowing, change in behavior or any other concerns.

## 2019-04-20 NOTE — ED Notes (Signed)
Pt ambulatory to waiting room. Pt verbalized understanding of discharge instructions.   

## 2019-04-20 NOTE — ED Provider Notes (Addendum)
[facility] EMERGENCY DEPARTMENT Provider Note   CSN: [id number] Arrival date & time: 04/19/19  2318    History   Chief Complaint Chief Complaint  Patient presents with  . Headache    HPI [name] is a 85 y.o. female.     Patient with history of diabetes and hypertension presenting with gradual onset headache over the past 1 hour.  States his headache came on while she was at rest and is progressively worsening.  She denies any thunderclap onset.  She took Tylenol at home without relief.  She reports a similar headache earlier in the day that got better with Tylenol.  She has not had a headache like this prior to today.  She denies any fevers, chills, nausea, vomiting.  No photophobia.  Denies any eye pain or vision change.  No history of glaucoma.  Denies any falls or head trauma.  Does not take any blood thinners.  She reports she is here for relief of her headache and is very uncomfortable.  No chest pain, shortness of breath, abdominal pain.  No focal weakness, numbness or tingling.  Discussed with patient's daughter and husband who are waiting for her outside.  He reports she just got out of [facility] rehabilitation yesterday after 3-week stay after being discharged from the hospital on April 2.  They report she has been lying in bed all day complaining of a headache on and off.  There is been no trauma.  There is been no fever or vomiting.  They deny any history of headaches in the past or history of migraines.  Patient did have an ED visit in 2015 for a "migraine headache".  The history is provided by the patient.  Headache  Associated symptoms: no abdominal pain, no congestion, no cough, no dizziness, no fever, no myalgias, no nausea, no photophobia, no vomiting and no weakness     Past Medical History:  Diagnosis Date  . Diabetes mellitus without complication ([place])   . Diabetic neuropathy ([place])   . Hypertension   . Stroke ([place])   . Urinary incontinence      Patient Active Problem List   Diagnosis Date Noted  . Acute encephalopathy 03/24/2019  . Hypotension 02/07/2019  . Slurred speech 11/17/2018  . Diabetic neuropathy ([place]) 11/17/2018  . AKI (acute kidney injury) ([place]) 11/17/2018  . Uncontrolled type 2 diabetes mellitus with hyperglycemia ([place]) 06/11/2018  . Hyperlipidemia 02/20/2016  . Essential hypertension, benign 10/10/2015  . Vitamin D deficiency 10/10/2015  . Class 1 obesity due to excess calories without serious comorbidity with body mass index (BMI) of 31.0 to 31.9 in adult 10/10/2015  . HIP PAIN 01/12/2009  . SPINAL STENOSIS 01/12/2009  . LOW BACK PAIN 01/12/2009  . SPONDYLOLYSIS 01/12/2009  . SHOULDER PAIN 10/04/2008  . Uncontrolled type 2 diabetes mellitus with complication, without long-term current use of insulin ([place]) 10/01/2008    Past Surgical History:  Procedure Laterality Date  . ABDOMINAL HYSTERECTOMY    . APPENDECTOMY    . BREAST SURGERY     begnin tumor removed  . CATARACT EXTRACTION, BILATERAL       OB History   No obstetric history on file.      Home Medications    Prior to Admission medications   Medication Sig Start Date End Date Taking? Authorizing Provider  amLODipine (NORVASC) 5 MG tablet Take 1 tablet (5 mg total) by mouth daily for 30 days. 03/26/19 04/25/19  Manuella Ghazi, Pratik D, DO  Cholecalciferol (VITAMIN  D3) 5000 units CAPS Take 1 capsule (5,000 Units total) by mouth daily. Patient taking differently: Take 5,000 Units by mouth every evening.  04/15/17   Nida, Marella Chimes, MD  Cyanocobalamin (B-12 PO) Take 1 tablet by mouth every evening.    [provider]  feeding supplement, ENSURE ENLIVE, (ENSURE ENLIVE) LIQD Take 237 mLs by mouth 3 (three) times daily between meals. 03/26/19   Manuella Ghazi, Pratik D, DO  insulin glargine (LANTUS) 100 UNIT/ML injection Inject 0.25 mLs (25 Units total) into the skin 2 (two) times daily. Patient taking differently: Inject 24 Units into the skin 2 (two) times  daily.  02/10/19   Manuella Ghazi, Pratik D, DO  JANUVIA 50 MG tablet TAKE 1 TABLET BY MOUTH  DAILY Patient taking differently: Take 50 mg by mouth every evening.  05/01/18   Cassandria Anger, MD  metoprolol succinate (TOPROL-XL) 50 MG 24 hr tablet Take 50 mg by mouth at bedtime. Take with or immediately following a meal.     [provider]  MYRBETRIQ 50 MG TB24 tablet Take 50 mg by mouth every evening.  09/02/14   [provider]  nystatin (MYCOSTATIN/NYSTOP) powder Apply topically 2 (two) times daily. Apply to affected skin for treatment of fungal infection. Please keep area clean and dry. 11/30/18   Barton Dubois, MD  simvastatin (ZOCOR) 20 MG tablet Take 20 mg by mouth every evening.    [provider]  UNIFINE PENTIPS 31G X 8 MM MISC USE ONCE DAILY AS DIRECTED 03/05/19   Cassandria Anger, MD    Family History Family History  Problem Relation Age of Onset  . CAD Mother   . Hypertension Mother   . Stroke Father   . Heart attack Brother   . Cancer Brother     Social History Social History   Tobacco Use  . Smoking status: Never Smoker  . Smokeless tobacco: Never Used  Substance Use Topics  . Alcohol use: No  . Drug use: No     Allergies   Codeine   Review of Systems Review of Systems  Constitutional: Negative for activity change, appetite change and fever.  HENT: Negative for congestion and rhinorrhea.   Eyes: Negative for photophobia and visual disturbance.  Respiratory: Negative for cough and shortness of breath.   Gastrointestinal: Negative for abdominal pain, nausea and vomiting.  Genitourinary: Negative for dysuria and hematuria.  Musculoskeletal: Negative for arthralgias and myalgias.  Neurological: Positive for headaches. Negative for dizziness and weakness.   all other systems are negative except as noted in the HPI and PMH.     Physical Exam Updated Vital Signs BP (!) 150/39 (BP Location: Left Arm)   Pulse 69   Temp 98.2 F (36.8  C) (Oral)   Resp 18   Ht 5' 9" (1.753 m)   Wt 91.6 kg   SpO2 97%   BMI 29.83 kg/m   Physical Exam Vitals signs and nursing note reviewed.  Constitutional:      General: She is not in acute distress.    Appearance: She is well-developed.     Comments: Appears uncomfortable  HENT:     Head: Normocephalic and atraumatic.     Comments: No temporal artery tenderness    Mouth/Throat:     Pharynx: No oropharyngeal exudate.  Eyes:     Conjunctiva/sclera: Conjunctivae normal.     Pupils: Pupils are equal, round, and reactive to light.     Comments: No photophobia IOP OD 19-23 mmHg.  Neck:  Musculoskeletal: Normal range of motion and neck supple.     Comments: No meningismus. Cardiovascular:     Rate and Rhythm: Normal rate and regular rhythm.     Heart sounds: Normal heart sounds. No murmur.  Pulmonary:     Effort: Pulmonary effort is normal. No respiratory distress.     Breath sounds: Normal breath sounds.  Abdominal:     Palpations: Abdomen is soft.     Tenderness: There is no abdominal tenderness. There is no guarding or rebound.  Musculoskeletal: Normal range of motion.        General: No tenderness.  Skin:    General: Skin is warm.     Capillary Refill: Capillary refill takes less than 2 seconds.  Neurological:     General: No focal deficit present.     Mental Status: She is alert and oriented to person, place, and time. Mental status is at baseline.     Cranial Nerves: No cranial nerve deficit.     Motor: No abnormal muscle tone.     Coordination: Coordination normal.     Comments: CN 2-12 intact, no ataxia on finger to nose, no nystagmus, 5/5 strength throughout, no pronator drift,  She is able to transfer from wheelchair to bed but apparently does not walk at baseline.  Psychiatric:        Behavior: Behavior normal.      ED Treatments / Results  Labs (all labs ordered are listed, but only abnormal results are displayed) Labs Reviewed  BASIC METABOLIC  PANEL - Abnormal; Notable for the following components:      Result Value   Glucose, Bld 208 (*)    All other components within normal limits  CBC WITH DIFFERENTIAL/PLATELET  SEDIMENTATION RATE  TROPONIN I    EKG EKG Interpretation  Date/Time:  Monday April 20 2019 00:13:07 EDT Ventricular Rate:  80 PR Interval:    QRS Duration: 127 QT Interval:  399 QTC Calculation: 461 R Axis:   71 Text Interpretation:  Sinus rhythm Right bundle branch block Baseline wander in lead(s) I II aVR V6 No significant change was found Confirmed by Ezequiel Essex [id number]) on 04/20/2019 12:27:32 AM   Radiology Ct Head Wo Contrast  Result Date: 04/20/2019 CLINICAL DATA:  85 year old female with severe headache, worst of life. EXAM: CT HEAD WITHOUT CONTRAST TECHNIQUE: Contiguous axial images were obtained from the base of the skull through the vertex without intravenous contrast. COMPARISON:  Brain MRI and head CT 03/24/2019 and earlier. FINDINGS: Brain: Stable cerebral volume. Stable gray-white matter differentiation throughout the brain. No midline shift, mass effect, or evidence of intracranial mass lesion. No ventriculomegaly. No acute intracranial hemorrhage identified. No cortically based acute infarct identified. No cortical encephalomalacia identified. Vascular: Calcified atherosclerosis at the skull base. No suspicious intracranial vascular hyperdensity. Skull: Negative. Sinuses/Orbits: Visualized paranasal sinuses and mastoids are stable and well pneumatized. Other: No acute orbit or scalp soft tissue findings. IMPRESSION: No acute intracranial abnormality. Stable noncontrast CT appearance of the brain. Electronically Signed   By: Genevie Ann M.D.   On: 04/20/2019 01:19    Procedures Procedures (including critical care time)  Medications Ordered in ED Medications  fluorescein ophthalmic strip 1 strip (has no administration in time range)  tetracaine (PONTOCAINE) 0.5 % ophthalmic solution 2 drop (has no  administration in time range)  fentaNYL (SUBLIMAZE) injection 25 mcg (has no administration in time range)     Initial Impression / Assessment and Plan / ED Course  I have reviewed  the triage vital signs and the nursing notes.  Pertinent labs & imaging results that were available during my care of the patient were reviewed by me and considered in my medical decision making (see chart for details).       Gradual onset headache over the past 1 hour.  No trauma.  No appreciable deficits. Intraocular pressures are normal.  Low suspicion for glaucoma.  CT head negative for hemorrhage or other acute pathology. MRA in November 2019 was negative for aneurysm. ESR normal.  Low suspicion for temporal arteritis. No fever. Nonfocal neurological exam. Her mental status is at baseline per family.   Headache is improved with treatment in the ED including Compazine, Toradol, fentanyl.  There is no evidence of fever or meningismus.  Low suspicion for meningitis, subarachnoid hemorrhage, temporal arteritis, glaucoma.  Patient improved with treatment in the ED and is tolerating p.o. Her blood pressure is well controlled. CT scan does not show any acute pathology and she has no new neurological deficits on exam.  She appears stable for outpatient follow-up.  Return precautions discussed.  Final Clinical Impressions(s) / ED Diagnoses   Final diagnoses:  Headache, unspecified headache type    ED Discharge Orders    None       , Annie Main, MD 04/20/19 3500    Ezequiel Essex, MD 04/20/19 [phone]

## 2019-04-21 ENCOUNTER — Encounter: Payer: Self-pay | Admitting: Neurology

## 2019-04-22 DIAGNOSIS — I10 Essential (primary) hypertension: Secondary | ICD-10-CM | POA: Diagnosis not present

## 2019-04-22 DIAGNOSIS — G9341 Metabolic encephalopathy: Secondary | ICD-10-CM | POA: Diagnosis not present

## 2019-04-22 DIAGNOSIS — I69398 Other sequelae of cerebral infarction: Secondary | ICD-10-CM | POA: Diagnosis not present

## 2019-04-22 DIAGNOSIS — M6281 Muscle weakness (generalized): Secondary | ICD-10-CM | POA: Diagnosis not present

## 2019-04-22 DIAGNOSIS — E1140 Type 2 diabetes mellitus with diabetic neuropathy, unspecified: Secondary | ICD-10-CM | POA: Diagnosis not present

## 2019-04-22 DIAGNOSIS — E1165 Type 2 diabetes mellitus with hyperglycemia: Secondary | ICD-10-CM | POA: Diagnosis not present

## 2019-04-23 DIAGNOSIS — E1165 Type 2 diabetes mellitus with hyperglycemia: Secondary | ICD-10-CM | POA: Diagnosis not present

## 2019-04-23 DIAGNOSIS — M6281 Muscle weakness (generalized): Secondary | ICD-10-CM | POA: Diagnosis not present

## 2019-04-23 DIAGNOSIS — G9341 Metabolic encephalopathy: Secondary | ICD-10-CM | POA: Diagnosis not present

## 2019-04-23 DIAGNOSIS — E1140 Type 2 diabetes mellitus with diabetic neuropathy, unspecified: Secondary | ICD-10-CM | POA: Diagnosis not present

## 2019-04-23 DIAGNOSIS — I10 Essential (primary) hypertension: Secondary | ICD-10-CM | POA: Diagnosis not present

## 2019-04-23 DIAGNOSIS — I69398 Other sequelae of cerebral infarction: Secondary | ICD-10-CM | POA: Diagnosis not present

## 2019-04-23 DIAGNOSIS — F5102 Adjustment insomnia: Secondary | ICD-10-CM | POA: Diagnosis not present

## 2019-04-24 ENCOUNTER — Ambulatory Visit: Payer: Medicare Other | Admitting: "Endocrinology

## 2019-04-24 DIAGNOSIS — M6281 Muscle weakness (generalized): Secondary | ICD-10-CM | POA: Diagnosis not present

## 2019-04-24 DIAGNOSIS — E1140 Type 2 diabetes mellitus with diabetic neuropathy, unspecified: Secondary | ICD-10-CM | POA: Diagnosis not present

## 2019-04-24 DIAGNOSIS — E1165 Type 2 diabetes mellitus with hyperglycemia: Secondary | ICD-10-CM | POA: Diagnosis not present

## 2019-04-24 DIAGNOSIS — I69398 Other sequelae of cerebral infarction: Secondary | ICD-10-CM | POA: Diagnosis not present

## 2019-04-24 DIAGNOSIS — G9341 Metabolic encephalopathy: Secondary | ICD-10-CM | POA: Diagnosis not present

## 2019-04-24 DIAGNOSIS — I10 Essential (primary) hypertension: Secondary | ICD-10-CM | POA: Diagnosis not present

## 2019-04-27 DIAGNOSIS — I10 Essential (primary) hypertension: Secondary | ICD-10-CM | POA: Diagnosis not present

## 2019-04-27 DIAGNOSIS — M6281 Muscle weakness (generalized): Secondary | ICD-10-CM | POA: Diagnosis not present

## 2019-04-27 DIAGNOSIS — G9341 Metabolic encephalopathy: Secondary | ICD-10-CM | POA: Diagnosis not present

## 2019-04-27 DIAGNOSIS — I69398 Other sequelae of cerebral infarction: Secondary | ICD-10-CM | POA: Diagnosis not present

## 2019-04-27 DIAGNOSIS — E1165 Type 2 diabetes mellitus with hyperglycemia: Secondary | ICD-10-CM | POA: Diagnosis not present

## 2019-04-27 DIAGNOSIS — E1140 Type 2 diabetes mellitus with diabetic neuropathy, unspecified: Secondary | ICD-10-CM | POA: Diagnosis not present

## 2019-04-29 DIAGNOSIS — I10 Essential (primary) hypertension: Secondary | ICD-10-CM | POA: Diagnosis not present

## 2019-04-29 DIAGNOSIS — G9341 Metabolic encephalopathy: Secondary | ICD-10-CM | POA: Diagnosis not present

## 2019-04-29 DIAGNOSIS — M6281 Muscle weakness (generalized): Secondary | ICD-10-CM | POA: Diagnosis not present

## 2019-04-29 DIAGNOSIS — E1140 Type 2 diabetes mellitus with diabetic neuropathy, unspecified: Secondary | ICD-10-CM | POA: Diagnosis not present

## 2019-04-29 DIAGNOSIS — I69398 Other sequelae of cerebral infarction: Secondary | ICD-10-CM | POA: Diagnosis not present

## 2019-04-29 DIAGNOSIS — E1165 Type 2 diabetes mellitus with hyperglycemia: Secondary | ICD-10-CM | POA: Diagnosis not present

## 2019-04-29 DIAGNOSIS — Z6833 Body mass index (BMI) 33.0-33.9, adult: Secondary | ICD-10-CM | POA: Diagnosis not present

## 2019-04-29 DIAGNOSIS — E6609 Other obesity due to excess calories: Secondary | ICD-10-CM | POA: Diagnosis not present

## 2019-04-30 ENCOUNTER — Ambulatory Visit: Payer: PRIVATE HEALTH INSURANCE | Admitting: Neurology

## 2019-04-30 ENCOUNTER — Telehealth: Payer: Self-pay

## 2019-04-30 DIAGNOSIS — E1165 Type 2 diabetes mellitus with hyperglycemia: Secondary | ICD-10-CM

## 2019-04-30 DIAGNOSIS — I10 Essential (primary) hypertension: Secondary | ICD-10-CM | POA: Diagnosis not present

## 2019-04-30 DIAGNOSIS — G9341 Metabolic encephalopathy: Secondary | ICD-10-CM | POA: Diagnosis not present

## 2019-04-30 DIAGNOSIS — E1140 Type 2 diabetes mellitus with diabetic neuropathy, unspecified: Secondary | ICD-10-CM | POA: Diagnosis not present

## 2019-04-30 DIAGNOSIS — M6281 Muscle weakness (generalized): Secondary | ICD-10-CM | POA: Diagnosis not present

## 2019-04-30 DIAGNOSIS — I69398 Other sequelae of cerebral infarction: Secondary | ICD-10-CM | POA: Diagnosis not present

## 2019-04-30 NOTE — Telephone Encounter (Signed)
Opened in Error.

## 2019-05-04 ENCOUNTER — Telehealth: Payer: Self-pay | Admitting: Neurology

## 2019-05-04 DIAGNOSIS — I69398 Other sequelae of cerebral infarction: Secondary | ICD-10-CM | POA: Diagnosis not present

## 2019-05-04 DIAGNOSIS — G9341 Metabolic encephalopathy: Secondary | ICD-10-CM | POA: Diagnosis not present

## 2019-05-04 DIAGNOSIS — E1165 Type 2 diabetes mellitus with hyperglycemia: Secondary | ICD-10-CM | POA: Diagnosis not present

## 2019-05-04 DIAGNOSIS — E1140 Type 2 diabetes mellitus with diabetic neuropathy, unspecified: Secondary | ICD-10-CM | POA: Diagnosis not present

## 2019-05-04 DIAGNOSIS — I10 Essential (primary) hypertension: Secondary | ICD-10-CM | POA: Diagnosis not present

## 2019-05-04 DIAGNOSIS — M6281 Muscle weakness (generalized): Secondary | ICD-10-CM | POA: Diagnosis not present

## 2019-05-04 NOTE — Telephone Encounter (Addendum)
Hard fax from [place]  Rx request CLONAZEPAM 0.5 MG Rx not on current medication list Rx was D/C by Dr. Heath Lark, DO on 03/26/19 reason Stop taking at Discharge   Please advise Ok with sending New Rx

## 2019-05-04 NOTE — Telephone Encounter (Signed)
Denied.  Pt hasn't been seen since last July.  Hospitalizations since that time and was d/c.

## 2019-05-06 ENCOUNTER — Ambulatory Visit: Payer: Medicare Other | Admitting: "Endocrinology

## 2019-05-06 NOTE — Telephone Encounter (Signed)
Noted Rx DENIED

## 2019-05-07 ENCOUNTER — Telehealth: Payer: Self-pay | Admitting: Neurology

## 2019-05-07 ENCOUNTER — Other Ambulatory Visit: Payer: Self-pay | Admitting: Neurology

## 2019-05-07 DIAGNOSIS — I69398 Other sequelae of cerebral infarction: Secondary | ICD-10-CM | POA: Diagnosis not present

## 2019-05-07 DIAGNOSIS — M6281 Muscle weakness (generalized): Secondary | ICD-10-CM | POA: Diagnosis not present

## 2019-05-07 DIAGNOSIS — E1165 Type 2 diabetes mellitus with hyperglycemia: Secondary | ICD-10-CM | POA: Diagnosis not present

## 2019-05-07 DIAGNOSIS — I10 Essential (primary) hypertension: Secondary | ICD-10-CM | POA: Diagnosis not present

## 2019-05-07 DIAGNOSIS — G9341 Metabolic encephalopathy: Secondary | ICD-10-CM | POA: Diagnosis not present

## 2019-05-07 DIAGNOSIS — E1140 Type 2 diabetes mellitus with diabetic neuropathy, unspecified: Secondary | ICD-10-CM | POA: Diagnosis not present

## 2019-05-07 NOTE — Telephone Encounter (Signed)
Patient needs an appointment before this can be filled.  Patient notified and has an appointment for next week.

## 2019-05-07 NOTE — Telephone Encounter (Signed)
Jonni Sanger (Pharmacist) from [facility] called regarding this patient and her needing a refill on her Clonazepam medication 0.5 MG. She has been having it filled through mail order. She has not had [facility] pharmacy fill in since 05/14/17. They have faxed over a request to have it filled. Thanks

## 2019-05-08 DIAGNOSIS — I10 Essential (primary) hypertension: Secondary | ICD-10-CM | POA: Diagnosis not present

## 2019-05-08 DIAGNOSIS — I69398 Other sequelae of cerebral infarction: Secondary | ICD-10-CM | POA: Diagnosis not present

## 2019-05-08 DIAGNOSIS — G9341 Metabolic encephalopathy: Secondary | ICD-10-CM | POA: Diagnosis not present

## 2019-05-08 DIAGNOSIS — M6281 Muscle weakness (generalized): Secondary | ICD-10-CM | POA: Diagnosis not present

## 2019-05-08 DIAGNOSIS — E1140 Type 2 diabetes mellitus with diabetic neuropathy, unspecified: Secondary | ICD-10-CM | POA: Diagnosis not present

## 2019-05-08 DIAGNOSIS — E1165 Type 2 diabetes mellitus with hyperglycemia: Secondary | ICD-10-CM | POA: Diagnosis not present

## 2019-05-11 ENCOUNTER — Other Ambulatory Visit: Payer: Self-pay

## 2019-05-11 ENCOUNTER — Ambulatory Visit: Payer: Medicare Other | Admitting: Psychology

## 2019-05-11 DIAGNOSIS — I10 Essential (primary) hypertension: Secondary | ICD-10-CM | POA: Diagnosis not present

## 2019-05-11 DIAGNOSIS — E1165 Type 2 diabetes mellitus with hyperglycemia: Secondary | ICD-10-CM | POA: Diagnosis not present

## 2019-05-11 DIAGNOSIS — G9341 Metabolic encephalopathy: Secondary | ICD-10-CM | POA: Diagnosis not present

## 2019-05-11 DIAGNOSIS — E1140 Type 2 diabetes mellitus with diabetic neuropathy, unspecified: Secondary | ICD-10-CM | POA: Diagnosis not present

## 2019-05-11 DIAGNOSIS — I69398 Other sequelae of cerebral infarction: Secondary | ICD-10-CM | POA: Diagnosis not present

## 2019-05-11 DIAGNOSIS — M6281 Muscle weakness (generalized): Secondary | ICD-10-CM | POA: Diagnosis not present

## 2019-05-11 MED ORDER — INSULIN GLARGINE 100 UNIT/ML ~~LOC~~ SOLN: 25.0000 [IU] | Freq: Two times a day (BID) | SUBCUTANEOUS | 2 refills | Status: DC

## 2019-05-11 NOTE — Progress Notes (Signed)
Virtual Visit via Telephone Note The purpose of this virtual visit is to provide medical care while limiting exposure to the novel coronavirus.    Consent was obtained for phone visit:  Yes.   Answered questions that patient had about telehealth interaction:  Yes.   I discussed the limitations, risks, security and privacy concerns of performing an evaluation and management service by telephone. I also discussed with the patient that there may be a patient responsible charge related to this service. The patient expressed understanding and agreed to proceed.  Pt location: Home Physician Location: office Name of referring provider:  Sharilyn Sites, MD I connected with .[name] at patients initiation/request on 05/13/2019 at  9:15 AM EDT by telephone and verified that I am speaking with the correct person using two identifiers.  Pt MRN:  [id number] Pt DOB:  Jul 07, 1929   History of Present Illness:  Patient seen today in follow-up for orthostatic tremor.  I last saw her in July, 2019.  Much has happened since our last visit and I have reviewed records.  When I last saw her, her orthostatic tremor was well controlled and I decreased her from clonazepam, 0.5 mg to clonazepam, 0.25 mg in the morning.  Reports that she did that but she increased all the way to 2 tablets.  Patient was then in the hospital in April, 2020.  Her husband (apparently also with memory issues) found her laying in the bed for a lengthy amount of time and she was soaked in urine and was admitted to the hospital with mental status change.  This was ultimately felt due to polypharmacy.  Her son stated that the patient was chronically confused, but usually awake.  Hospitalists ended up discontinuing her clonazepam and tizanidine.   She states that "no one told me" and she went back on the medication.   Apparently, this somehow got restarted and I just got a refill request for the medication, which was denied.  This is why  she is following up. She has been out of the medication for 2 months.  She reports that she is in a wheelchair and is unable to walk and PT comes to the home to help her.  They help with standing and transfers.  She is able to do better with that without her legs shaking.  She can stand about 5 min now.   Observations/Objective:   [facility] BLIND completed and patient scored a 14/22.  Scores month, day, year correctly bu not the date.  She had trouble with delayed recall.    Chemistry      Component Value Date/Time   NA 137 04/20/2019 0002   NA 142 09/25/2018 1126   K 4.1 04/20/2019 0002   CL 103 04/20/2019 0002   CO2 24 04/20/2019 0002   BUN 16 04/20/2019 0002   BUN 24 09/25/2018 1126   CREATININE 0.84 04/20/2019 0002      Component Value Date/Time   CALCIUM 9.2 04/20/2019 0002   ALKPHOS 79 03/24/2019 1158   AST 18 03/24/2019 1158   ALT 14 03/24/2019 1158   BILITOT 1.9 (H) 03/24/2019 1158   BILITOT 0.6 09/25/2018 1126     Lab Results  Component Value Date   TSH 4.137 03/24/2019   No results found for: VITAMINB12   Assessment and Plan:   1.  Orthostatic tremor  -Last visit, I decreased her clonazepam to 0.25 mg because she was doing so well.  the hospital discontinued it when she  went in with mental status change.  Turns out that the patient had actually gone up to 1 mg on her own before the hospital stay, which is likely the source of mental status change.  For some reason, she restarted it after the hospitalization, but she did run out of it about 2 months ago.  I do not recommend we restart it.  She is not even walking anymore, and has been wheelchair-bound, with the exception of when physical therapy helped her stand.  She really is not tremoring that much when she stands, and I told her that the risks of the medicines outweigh the benefits.  She asked me about other medications, and I really do not recommend that now that she is not walking.  She agrees.  2.  Memory change,  likely dementia  -Recommended caregiving and sounds like she has that.  She was actually quite appropriate in the visit today and was by herself for the visit (not in the home by herself, but just for a phone visit).  If primary care has not already done so, would recommend checking her B12.  Primary care physician is not in our system.  Patient is not in the office today because of the pandemic and we cannot check that today.  Recommend that someone else assist with finances and cooking and monitor medications/distribution of medications.  Patient states that is the case.  Daughter is getting groceries for her.  Follow Up Instructions:  Follow-up as needed  -I discussed the assessment and treatment plan with the patient. The patient was provided an opportunity to ask questions and all were answered. The patient agreed with the plan and demonstrated an understanding of the instructions.   The patient was advised to call back or seek an in-person evaluation if the symptoms worsen or if the condition fails to improve as anticipated.    Total Time spent in visit with the patient was: 11 minutes (which did not include the multiple attempts to get her on the telephone - cell phone had apparently run out of battery), of which 100% of the time was spent in counseling and/or coordinating care on safety.   Pt understands and agrees with the plan of care outlined.     Alonza Bogus, DO

## 2019-05-12 DIAGNOSIS — I10 Essential (primary) hypertension: Secondary | ICD-10-CM | POA: Diagnosis not present

## 2019-05-12 DIAGNOSIS — I69398 Other sequelae of cerebral infarction: Secondary | ICD-10-CM | POA: Diagnosis not present

## 2019-05-12 DIAGNOSIS — E1165 Type 2 diabetes mellitus with hyperglycemia: Secondary | ICD-10-CM | POA: Diagnosis not present

## 2019-05-12 DIAGNOSIS — M6281 Muscle weakness (generalized): Secondary | ICD-10-CM | POA: Diagnosis not present

## 2019-05-12 DIAGNOSIS — G9341 Metabolic encephalopathy: Secondary | ICD-10-CM | POA: Diagnosis not present

## 2019-05-12 DIAGNOSIS — E1140 Type 2 diabetes mellitus with diabetic neuropathy, unspecified: Secondary | ICD-10-CM | POA: Diagnosis not present

## 2019-05-13 ENCOUNTER — Encounter: Payer: Self-pay | Admitting: Neurology

## 2019-05-13 ENCOUNTER — Other Ambulatory Visit: Payer: Self-pay

## 2019-05-13 ENCOUNTER — Telehealth (INDEPENDENT_AMBULATORY_CARE_PROVIDER_SITE_OTHER): Payer: Medicare Other | Admitting: Neurology

## 2019-05-13 ENCOUNTER — Telehealth: Payer: Self-pay | Admitting: Orthopedic Surgery

## 2019-05-13 DIAGNOSIS — E1165 Type 2 diabetes mellitus with hyperglycemia: Secondary | ICD-10-CM | POA: Diagnosis not present

## 2019-05-13 DIAGNOSIS — M6281 Muscle weakness (generalized): Secondary | ICD-10-CM | POA: Diagnosis not present

## 2019-05-13 DIAGNOSIS — I10 Essential (primary) hypertension: Secondary | ICD-10-CM | POA: Diagnosis not present

## 2019-05-13 DIAGNOSIS — G9341 Metabolic encephalopathy: Secondary | ICD-10-CM | POA: Diagnosis not present

## 2019-05-13 DIAGNOSIS — I69398 Other sequelae of cerebral infarction: Secondary | ICD-10-CM | POA: Diagnosis not present

## 2019-05-13 DIAGNOSIS — E1140 Type 2 diabetes mellitus with diabetic neuropathy, unspecified: Secondary | ICD-10-CM | POA: Diagnosis not present

## 2019-05-13 DIAGNOSIS — G252 Other specified forms of tremor: Secondary | ICD-10-CM | POA: Diagnosis not present

## 2019-05-13 NOTE — Telephone Encounter (Signed)
Patient has called and has left voice messages as follows: initially 05/12/19, however, no phone number or date of birth was left on voicemail.  A second voice message was received today, at 12:00 noon.  I have attempted to call back to patient 12:09pm-12:12pm, and number [phone] was ringing busy. Tried at approximately 12:15pm, and no answer; and after several rings, number stopped dialing. Patient's message mentioned appointment and also question about a bill received.

## 2019-05-14 DIAGNOSIS — M6281 Muscle weakness (generalized): Secondary | ICD-10-CM | POA: Diagnosis not present

## 2019-05-14 DIAGNOSIS — E669 Obesity, unspecified: Secondary | ICD-10-CM | POA: Diagnosis not present

## 2019-05-14 DIAGNOSIS — E1165 Type 2 diabetes mellitus with hyperglycemia: Secondary | ICD-10-CM | POA: Diagnosis not present

## 2019-05-14 DIAGNOSIS — E1140 Type 2 diabetes mellitus with diabetic neuropathy, unspecified: Secondary | ICD-10-CM | POA: Diagnosis not present

## 2019-05-14 DIAGNOSIS — G9341 Metabolic encephalopathy: Secondary | ICD-10-CM | POA: Diagnosis not present

## 2019-05-14 DIAGNOSIS — E782 Mixed hyperlipidemia: Secondary | ICD-10-CM | POA: Diagnosis not present

## 2019-05-14 DIAGNOSIS — I69398 Other sequelae of cerebral infarction: Secondary | ICD-10-CM | POA: Diagnosis not present

## 2019-05-14 DIAGNOSIS — I10 Essential (primary) hypertension: Secondary | ICD-10-CM | POA: Diagnosis not present

## 2019-05-15 DIAGNOSIS — G9341 Metabolic encephalopathy: Secondary | ICD-10-CM | POA: Diagnosis not present

## 2019-05-15 DIAGNOSIS — M6281 Muscle weakness (generalized): Secondary | ICD-10-CM | POA: Diagnosis not present

## 2019-05-15 DIAGNOSIS — I69398 Other sequelae of cerebral infarction: Secondary | ICD-10-CM | POA: Diagnosis not present

## 2019-05-15 DIAGNOSIS — E1165 Type 2 diabetes mellitus with hyperglycemia: Secondary | ICD-10-CM | POA: Diagnosis not present

## 2019-05-15 DIAGNOSIS — I10 Essential (primary) hypertension: Secondary | ICD-10-CM | POA: Diagnosis not present

## 2019-05-15 DIAGNOSIS — E1140 Type 2 diabetes mellitus with diabetic neuropathy, unspecified: Secondary | ICD-10-CM | POA: Diagnosis not present

## 2019-05-19 DIAGNOSIS — E1165 Type 2 diabetes mellitus with hyperglycemia: Secondary | ICD-10-CM | POA: Diagnosis not present

## 2019-05-19 DIAGNOSIS — G9341 Metabolic encephalopathy: Secondary | ICD-10-CM | POA: Diagnosis not present

## 2019-05-19 DIAGNOSIS — M1991 Primary osteoarthritis, unspecified site: Secondary | ICD-10-CM | POA: Diagnosis not present

## 2019-05-19 DIAGNOSIS — I69398 Other sequelae of cerebral infarction: Secondary | ICD-10-CM | POA: Diagnosis not present

## 2019-05-19 DIAGNOSIS — M79642 Pain in left hand: Secondary | ICD-10-CM | POA: Diagnosis not present

## 2019-05-19 DIAGNOSIS — M6281 Muscle weakness (generalized): Secondary | ICD-10-CM | POA: Diagnosis not present

## 2019-05-19 DIAGNOSIS — Z6833 Body mass index (BMI) 33.0-33.9, adult: Secondary | ICD-10-CM | POA: Diagnosis not present

## 2019-05-19 DIAGNOSIS — E1140 Type 2 diabetes mellitus with diabetic neuropathy, unspecified: Secondary | ICD-10-CM | POA: Diagnosis not present

## 2019-05-19 DIAGNOSIS — I10 Essential (primary) hypertension: Secondary | ICD-10-CM | POA: Diagnosis not present

## 2019-05-19 DIAGNOSIS — R2232 Localized swelling, mass and lump, left upper limb: Secondary | ICD-10-CM | POA: Diagnosis not present

## 2019-05-19 NOTE — Telephone Encounter (Signed)
I called back to patient to follow up; also to remind patient of appointment tomorrow, 05/20/19, with Dr Aline Brochure. Reached, and patient opted to re-schedule to next week. Also said will bring the dates of services regarding bills in question so that we may provide her with the appropriate billing party's contact information.

## 2019-05-20 ENCOUNTER — Ambulatory Visit: Payer: Self-pay | Admitting: Orthopedic Surgery

## 2019-05-20 DIAGNOSIS — E785 Hyperlipidemia, unspecified: Secondary | ICD-10-CM | POA: Diagnosis not present

## 2019-05-20 DIAGNOSIS — M1990 Unspecified osteoarthritis, unspecified site: Secondary | ICD-10-CM | POA: Diagnosis not present

## 2019-05-20 DIAGNOSIS — E1165 Type 2 diabetes mellitus with hyperglycemia: Secondary | ICD-10-CM | POA: Diagnosis not present

## 2019-05-20 DIAGNOSIS — Z8673 Personal history of transient ischemic attack (TIA), and cerebral infarction without residual deficits: Secondary | ICD-10-CM | POA: Diagnosis not present

## 2019-05-20 DIAGNOSIS — E1140 Type 2 diabetes mellitus with diabetic neuropathy, unspecified: Secondary | ICD-10-CM | POA: Diagnosis not present

## 2019-05-20 DIAGNOSIS — G9341 Metabolic encephalopathy: Secondary | ICD-10-CM | POA: Diagnosis not present

## 2019-05-20 DIAGNOSIS — I10 Essential (primary) hypertension: Secondary | ICD-10-CM | POA: Diagnosis not present

## 2019-05-20 DIAGNOSIS — Z9114 Patient's other noncompliance with medication regimen: Secondary | ICD-10-CM | POA: Diagnosis not present

## 2019-05-20 DIAGNOSIS — Z794 Long term (current) use of insulin: Secondary | ICD-10-CM | POA: Diagnosis not present

## 2019-05-20 DIAGNOSIS — M6281 Muscle weakness (generalized): Secondary | ICD-10-CM | POA: Diagnosis not present

## 2019-05-20 DIAGNOSIS — I69398 Other sequelae of cerebral infarction: Secondary | ICD-10-CM | POA: Diagnosis not present

## 2019-05-21 DIAGNOSIS — M6281 Muscle weakness (generalized): Secondary | ICD-10-CM | POA: Diagnosis not present

## 2019-05-21 DIAGNOSIS — I10 Essential (primary) hypertension: Secondary | ICD-10-CM | POA: Diagnosis not present

## 2019-05-21 DIAGNOSIS — E1140 Type 2 diabetes mellitus with diabetic neuropathy, unspecified: Secondary | ICD-10-CM | POA: Diagnosis not present

## 2019-05-21 DIAGNOSIS — G9341 Metabolic encephalopathy: Secondary | ICD-10-CM | POA: Diagnosis not present

## 2019-05-21 DIAGNOSIS — E1165 Type 2 diabetes mellitus with hyperglycemia: Secondary | ICD-10-CM | POA: Diagnosis not present

## 2019-05-21 DIAGNOSIS — I69398 Other sequelae of cerebral infarction: Secondary | ICD-10-CM | POA: Diagnosis not present

## 2019-05-25 DIAGNOSIS — I10 Essential (primary) hypertension: Secondary | ICD-10-CM | POA: Diagnosis not present

## 2019-05-25 DIAGNOSIS — I69398 Other sequelae of cerebral infarction: Secondary | ICD-10-CM | POA: Diagnosis not present

## 2019-05-25 DIAGNOSIS — M6281 Muscle weakness (generalized): Secondary | ICD-10-CM | POA: Diagnosis not present

## 2019-05-25 DIAGNOSIS — E1165 Type 2 diabetes mellitus with hyperglycemia: Secondary | ICD-10-CM | POA: Diagnosis not present

## 2019-05-25 DIAGNOSIS — G9341 Metabolic encephalopathy: Secondary | ICD-10-CM | POA: Diagnosis not present

## 2019-05-25 DIAGNOSIS — E1140 Type 2 diabetes mellitus with diabetic neuropathy, unspecified: Secondary | ICD-10-CM | POA: Diagnosis not present

## 2019-05-26 DIAGNOSIS — G9341 Metabolic encephalopathy: Secondary | ICD-10-CM | POA: Diagnosis not present

## 2019-05-26 DIAGNOSIS — E1140 Type 2 diabetes mellitus with diabetic neuropathy, unspecified: Secondary | ICD-10-CM | POA: Diagnosis not present

## 2019-05-26 DIAGNOSIS — I69398 Other sequelae of cerebral infarction: Secondary | ICD-10-CM | POA: Diagnosis not present

## 2019-05-26 DIAGNOSIS — I10 Essential (primary) hypertension: Secondary | ICD-10-CM | POA: Diagnosis not present

## 2019-05-26 DIAGNOSIS — E1165 Type 2 diabetes mellitus with hyperglycemia: Secondary | ICD-10-CM | POA: Diagnosis not present

## 2019-05-26 DIAGNOSIS — M6281 Muscle weakness (generalized): Secondary | ICD-10-CM | POA: Diagnosis not present

## 2019-05-27 ENCOUNTER — Ambulatory Visit: Payer: Medicare Other | Admitting: Orthopedic Surgery

## 2019-05-27 DIAGNOSIS — I69398 Other sequelae of cerebral infarction: Secondary | ICD-10-CM | POA: Diagnosis not present

## 2019-05-27 DIAGNOSIS — G9341 Metabolic encephalopathy: Secondary | ICD-10-CM | POA: Diagnosis not present

## 2019-05-27 DIAGNOSIS — E1165 Type 2 diabetes mellitus with hyperglycemia: Secondary | ICD-10-CM | POA: Diagnosis not present

## 2019-05-27 DIAGNOSIS — E1140 Type 2 diabetes mellitus with diabetic neuropathy, unspecified: Secondary | ICD-10-CM | POA: Diagnosis not present

## 2019-05-27 DIAGNOSIS — M6281 Muscle weakness (generalized): Secondary | ICD-10-CM | POA: Diagnosis not present

## 2019-05-27 DIAGNOSIS — I10 Essential (primary) hypertension: Secondary | ICD-10-CM | POA: Diagnosis not present

## 2019-06-01 ENCOUNTER — Other Ambulatory Visit: Payer: Self-pay

## 2019-06-01 ENCOUNTER — Encounter: Payer: Self-pay | Admitting: Orthopedic Surgery

## 2019-06-01 ENCOUNTER — Ambulatory Visit (INDEPENDENT_AMBULATORY_CARE_PROVIDER_SITE_OTHER): Payer: Medicare Other | Admitting: Orthopedic Surgery

## 2019-06-01 VITALS — BP 161/71 | HR 53 | Temp 99.1°F | Ht 69.0 in | Wt 200.0 lb

## 2019-06-01 DIAGNOSIS — M1711 Unilateral primary osteoarthritis, right knee: Secondary | ICD-10-CM

## 2019-06-01 DIAGNOSIS — M1712 Unilateral primary osteoarthritis, left knee: Secondary | ICD-10-CM | POA: Diagnosis not present

## 2019-06-01 NOTE — Progress Notes (Signed)
Chief Complaint  Patient presents with  . Knee Pain    bilateral wants injections, states injections did not help last time   85 yo female with history of spondylosis spinal stenosis and has been not referred for surgery because of her age presents for bilateral knee injections  Past Medical History:  Diagnosis Date  . Diabetes mellitus without complication ([place])   . Diabetic neuropathy ([place])   . Hypertension   . Stroke ([place])   . Urinary incontinence    Encounter Diagnoses  Name Primary?  . Primary osteoarthritis of right knee Yes  . Primary osteoarthritis of left knee     Procedure note for bilateral knee injections  Procedure note left knee injection verbal consent was obtained to inject left knee joint  Timeout was completed to confirm the site of injection  The medications used were 40 mg of Depo-Medrol and 1% lidocaine 3 cc  Anesthesia was provided by ethyl chloride and the skin was prepped with alcohol.  After cleaning the skin with alcohol a 20-gauge needle was used to inject the left knee joint. There were no complications. A sterile bandage was applied.   Procedure note right knee injection verbal consent was obtained to inject right knee joint  Timeout was completed to confirm the site of injection  The medications used were 40 mg of Depo-Medrol and 1% lidocaine 3 cc  Anesthesia was provided by ethyl chloride and the skin was prepped with alcohol.  After cleaning the skin with alcohol a 20-gauge needle was used to inject the right knee joint. There were no complications. A sterile bandage was applied.  3 MONTHS FU

## 2019-06-04 DIAGNOSIS — M6281 Muscle weakness (generalized): Secondary | ICD-10-CM | POA: Diagnosis not present

## 2019-06-04 DIAGNOSIS — E1165 Type 2 diabetes mellitus with hyperglycemia: Secondary | ICD-10-CM | POA: Diagnosis not present

## 2019-06-04 DIAGNOSIS — E1140 Type 2 diabetes mellitus with diabetic neuropathy, unspecified: Secondary | ICD-10-CM | POA: Diagnosis not present

## 2019-06-04 DIAGNOSIS — I69398 Other sequelae of cerebral infarction: Secondary | ICD-10-CM | POA: Diagnosis not present

## 2019-06-04 DIAGNOSIS — I10 Essential (primary) hypertension: Secondary | ICD-10-CM | POA: Diagnosis not present

## 2019-06-04 DIAGNOSIS — G9341 Metabolic encephalopathy: Secondary | ICD-10-CM | POA: Diagnosis not present

## 2019-06-09 DIAGNOSIS — Z6833 Body mass index (BMI) 33.0-33.9, adult: Secondary | ICD-10-CM | POA: Diagnosis not present

## 2019-06-09 DIAGNOSIS — I10 Essential (primary) hypertension: Secondary | ICD-10-CM | POA: Diagnosis not present

## 2019-06-09 DIAGNOSIS — E1165 Type 2 diabetes mellitus with hyperglycemia: Secondary | ICD-10-CM | POA: Diagnosis not present

## 2019-06-09 DIAGNOSIS — Z1389 Encounter for screening for other disorder: Secondary | ICD-10-CM | POA: Diagnosis not present

## 2019-06-09 DIAGNOSIS — E7849 Other hyperlipidemia: Secondary | ICD-10-CM | POA: Diagnosis not present

## 2019-06-10 DIAGNOSIS — M6281 Muscle weakness (generalized): Secondary | ICD-10-CM | POA: Diagnosis not present

## 2019-06-10 DIAGNOSIS — G9341 Metabolic encephalopathy: Secondary | ICD-10-CM | POA: Diagnosis not present

## 2019-06-10 DIAGNOSIS — I10 Essential (primary) hypertension: Secondary | ICD-10-CM | POA: Diagnosis not present

## 2019-06-10 DIAGNOSIS — E1165 Type 2 diabetes mellitus with hyperglycemia: Secondary | ICD-10-CM | POA: Diagnosis not present

## 2019-06-10 DIAGNOSIS — E1140 Type 2 diabetes mellitus with diabetic neuropathy, unspecified: Secondary | ICD-10-CM | POA: Diagnosis not present

## 2019-06-10 DIAGNOSIS — I69398 Other sequelae of cerebral infarction: Secondary | ICD-10-CM | POA: Diagnosis not present

## 2019-06-16 ENCOUNTER — Ambulatory Visit: Payer: Medicare Other | Admitting: "Endocrinology

## 2019-06-16 DIAGNOSIS — M6281 Muscle weakness (generalized): Secondary | ICD-10-CM | POA: Diagnosis not present

## 2019-06-16 DIAGNOSIS — I10 Essential (primary) hypertension: Secondary | ICD-10-CM | POA: Diagnosis not present

## 2019-06-16 DIAGNOSIS — E1165 Type 2 diabetes mellitus with hyperglycemia: Secondary | ICD-10-CM | POA: Diagnosis not present

## 2019-06-16 DIAGNOSIS — G9341 Metabolic encephalopathy: Secondary | ICD-10-CM | POA: Diagnosis not present

## 2019-06-16 DIAGNOSIS — I69398 Other sequelae of cerebral infarction: Secondary | ICD-10-CM | POA: Diagnosis not present

## 2019-06-16 DIAGNOSIS — E1140 Type 2 diabetes mellitus with diabetic neuropathy, unspecified: Secondary | ICD-10-CM | POA: Diagnosis not present

## 2019-06-17 ENCOUNTER — Ambulatory Visit: Payer: Medicare Other | Admitting: Neurology

## 2019-07-07 DIAGNOSIS — E1142 Type 2 diabetes mellitus with diabetic polyneuropathy: Secondary | ICD-10-CM | POA: Diagnosis not present

## 2019-07-07 DIAGNOSIS — B351 Tinea unguium: Secondary | ICD-10-CM | POA: Diagnosis not present

## 2019-07-13 ENCOUNTER — Inpatient Hospital Stay (HOSPITAL_COMMUNITY)
Admission: EM | Admit: 2019-07-13 | Discharge: 2019-07-15 | DRG: 637 | Disposition: A | Discharge status: Home Health | Payer: Medicare Other | Attending: Internal Medicine | Admitting: Internal Medicine

## 2019-07-13 ENCOUNTER — Encounter (HOSPITAL_COMMUNITY): Payer: Self-pay | Admitting: Emergency Medicine

## 2019-07-13 ENCOUNTER — Other Ambulatory Visit: Payer: Self-pay

## 2019-07-13 ENCOUNTER — Emergency Department (HOSPITAL_COMMUNITY): Payer: Medicare Other

## 2019-07-13 DIAGNOSIS — E785 Hyperlipidemia, unspecified: Secondary | ICD-10-CM | POA: Diagnosis present

## 2019-07-13 DIAGNOSIS — R739 Hyperglycemia, unspecified: Secondary | ICD-10-CM

## 2019-07-13 DIAGNOSIS — R509 Fever, unspecified: Secondary | ICD-10-CM | POA: Diagnosis not present

## 2019-07-13 DIAGNOSIS — Z9842 Cataract extraction status, left eye: Secondary | ICD-10-CM

## 2019-07-13 DIAGNOSIS — Z823 Family history of stroke: Secondary | ICD-10-CM

## 2019-07-13 DIAGNOSIS — L03119 Cellulitis of unspecified part of limb: Secondary | ICD-10-CM

## 2019-07-13 DIAGNOSIS — E876 Hypokalemia: Secondary | ICD-10-CM | POA: Diagnosis present

## 2019-07-13 DIAGNOSIS — Z8673 Personal history of transient ischemic attack (TIA), and cerebral infarction without residual deficits: Secondary | ICD-10-CM

## 2019-07-13 DIAGNOSIS — R4182 Altered mental status, unspecified: Secondary | ICD-10-CM | POA: Diagnosis not present

## 2019-07-13 DIAGNOSIS — L03116 Cellulitis of left lower limb: Secondary | ICD-10-CM | POA: Diagnosis not present

## 2019-07-13 DIAGNOSIS — Z90710 Acquired absence of both cervix and uterus: Secondary | ICD-10-CM

## 2019-07-13 DIAGNOSIS — G9341 Metabolic encephalopathy: Secondary | ICD-10-CM

## 2019-07-13 DIAGNOSIS — E1165 Type 2 diabetes mellitus with hyperglycemia: Secondary | ICD-10-CM | POA: Diagnosis not present

## 2019-07-13 DIAGNOSIS — L03317 Cellulitis of buttock: Secondary | ICD-10-CM

## 2019-07-13 DIAGNOSIS — I959 Hypotension, unspecified: Secondary | ICD-10-CM | POA: Diagnosis not present

## 2019-07-13 DIAGNOSIS — E1140 Type 2 diabetes mellitus with diabetic neuropathy, unspecified: Secondary | ICD-10-CM | POA: Diagnosis present

## 2019-07-13 DIAGNOSIS — Z20828 Contact with and (suspected) exposure to other viral communicable diseases: Secondary | ICD-10-CM | POA: Diagnosis not present

## 2019-07-13 DIAGNOSIS — E1110 Type 2 diabetes mellitus with ketoacidosis without coma: Secondary | ICD-10-CM | POA: Diagnosis not present

## 2019-07-13 DIAGNOSIS — I10 Essential (primary) hypertension: Secondary | ICD-10-CM | POA: Diagnosis present

## 2019-07-13 DIAGNOSIS — I4510 Unspecified right bundle-branch block: Secondary | ICD-10-CM | POA: Diagnosis present

## 2019-07-13 DIAGNOSIS — Z9841 Cataract extraction status, right eye: Secondary | ICD-10-CM

## 2019-07-13 DIAGNOSIS — R404 Transient alteration of awareness: Secondary | ICD-10-CM | POA: Diagnosis not present

## 2019-07-13 DIAGNOSIS — Z9109 Other allergy status, other than to drugs and biological substances: Secondary | ICD-10-CM

## 2019-07-13 DIAGNOSIS — R0902 Hypoxemia: Secondary | ICD-10-CM | POA: Diagnosis not present

## 2019-07-13 DIAGNOSIS — Z8249 Family history of ischemic heart disease and other diseases of the circulatory system: Secondary | ICD-10-CM

## 2019-07-13 DIAGNOSIS — Z9114 Patient's other noncompliance with medication regimen: Secondary | ICD-10-CM

## 2019-07-13 DIAGNOSIS — R2689 Other abnormalities of gait and mobility: Secondary | ICD-10-CM | POA: Diagnosis present

## 2019-07-13 DIAGNOSIS — Z9181 History of falling: Secondary | ICD-10-CM

## 2019-07-13 DIAGNOSIS — F0390 Unspecified dementia without behavioral disturbance: Secondary | ICD-10-CM | POA: Diagnosis not present

## 2019-07-13 DIAGNOSIS — Z794 Long term (current) use of insulin: Secondary | ICD-10-CM

## 2019-07-13 DIAGNOSIS — R32 Unspecified urinary incontinence: Secondary | ICD-10-CM | POA: Diagnosis present

## 2019-07-13 DIAGNOSIS — Z79899 Other long term (current) drug therapy: Secondary | ICD-10-CM

## 2019-07-13 LAB — BASIC METABOLIC PANEL
Anion gap: 12 (ref 5–15)
BUN: 12 mg/dL (ref 8–23)
CO2: 21 mmol/L — ABNORMAL LOW (ref 22–32)
Calcium: 9.3 mg/dL (ref 8.9–10.3)
Chloride: 99 mmol/L (ref 98–111)
Creatinine, Ser: 0.8 mg/dL (ref 0.44–1.00)
GFR calc Af Amer: >60 mL/min (ref >60)
GFR calc non Af Amer: >60 mL/min (ref >60)
Glucose, Bld: 438 mg/dL — ABNORMAL HIGH (ref 70–99)
Potassium: 5.2 mmol/L — ABNORMAL HIGH (ref 3.5–5.1)
Sodium: 132 mmol/L — ABNORMAL LOW (ref 135–145)

## 2019-07-13 LAB — SARS CORONAVIRUS 2 BY RT PCR (HOSPITAL ORDER, PERFORMED IN ~~LOC~~ HOSPITAL LAB): SARS Coronavirus 2: NEGATIVE

## 2019-07-13 LAB — CBC
HCT: 40 % (ref 36.0–46.0)
Hemoglobin: 14.2 g/dL (ref 12.0–15.0)
MCH: 29.6 pg (ref 26.0–34.0)
MCHC: 35.5 g/dL (ref 30.0–36.0)
MCV: 83.5 fL (ref 80.0–100.0)
Platelets: 169 10*3/uL (ref 150–400)
RBC: 4.79 MIL/uL (ref 3.87–5.11)
RDW: 12.4 % (ref 11.5–15.5)
WBC: 10.7 10*3/uL — ABNORMAL HIGH (ref 4.0–10.5)
nRBC: 0 % (ref 0.0–0.2)

## 2019-07-13 LAB — CBG MONITORING, ED
Glucose-Capillary: 332 mg/dL — ABNORMAL HIGH (ref 70–99)
Glucose-Capillary: 433 mg/dL — ABNORMAL HIGH (ref 70–99)

## 2019-07-13 MED ORDER — INSULIN REGULAR(HUMAN) IN NACL 100-0.9 UT/100ML-% IV SOLN
INTRAVENOUS | Status: DC
  Administered 2019-07-14: 2.8 [IU]/h via INTRAVENOUS
  Filled 2019-07-13: qty 100

## 2019-07-13 MED ORDER — SODIUM CHLORIDE 0.9 % IV SOLN
INTRAVENOUS | Status: DC
  Administered 2019-07-13: 23:00:00 via INTRAVENOUS

## 2019-07-13 MED ORDER — ENOXAPARIN SODIUM 40 MG/0.4ML ~~LOC~~ SOLN
40.0000 mg | SUBCUTANEOUS | Status: DC
  Administered 2019-07-13 – 2019-07-14 (×2): 40 mg via SUBCUTANEOUS
  Filled 2019-07-13 (×2): qty 0.4

## 2019-07-13 MED ORDER — INSULIN REGULAR BOLUS VIA INFUSION
0.0000 [IU] | Freq: Three times a day (TID) | INTRAVENOUS | Status: DC
  Filled 2019-07-13: qty 10

## 2019-07-13 MED ORDER — SODIUM CHLORIDE 0.9 % IV SOLN
1.0000 g | Freq: Once | INTRAVENOUS | Status: AC
  Administered 2019-07-13: 1 g via INTRAVENOUS
  Filled 2019-07-13: qty 10

## 2019-07-13 MED ORDER — SODIUM CHLORIDE 0.9 % IV BOLUS
1000.0000 mL | Freq: Once | INTRAVENOUS | Status: AC
  Administered 2019-07-13: 21:00:00 1000 mL via INTRAVENOUS

## 2019-07-13 MED ORDER — DEXTROSE-NACL 5-0.45 % IV SOLN
INTRAVENOUS | Status: DC
  Administered 2019-07-14: 03:00:00 via INTRAVENOUS

## 2019-07-13 MED ORDER — DEXTROSE 50 % IV SOLN: 25.0000 mL | INTRAVENOUS | Status: DC | PRN

## 2019-07-13 MED ORDER — ACETAMINOPHEN 650 MG RE SUPP: 650.0000 mg | Freq: Four times a day (QID) | RECTAL | Status: DC | PRN

## 2019-07-13 MED ORDER — ACETAMINOPHEN 325 MG PO TABS: 650.0000 mg | ORAL_TABLET | Freq: Four times a day (QID) | ORAL | Status: DC | PRN

## 2019-07-13 NOTE — H&P (Signed)
TRH H&P    Patient Demographics:    [name], is a 85 y.o. female  MRN: [id number]  DOB - 1929/06/10  Admit Date - 07/13/2019  Referring MD/NP/PA:  Nat Christen  Outpatient Primary MD for the patient is Sharilyn Sites, MD  Patient coming from:  home  Chief complaint- altered mental status   HPI:    [name]  is a 85 y.o. female, w Hypertension, Dm2, with neuropathy, h/o stroke, apparently presents with altered mental status.    In ED,  T 100.8,  P 81  R 18  Bp 134/96  Pox 94% on RA Wt 94.2 kg  CT brain IMPRESSION: 1. No acute intracranial hemorrhage. 2. Moderate age-related atrophy and chronic microvascular ischemic changes.  CXR IMPRESSION: No active disease.  Wbc 10.7  Hgb 14.2, Plt 169 Na 132, K 5.2, Bun 12, Creatinine 0.80 Hco3 21  covid negative  Pt given insulin iv in the ED.  Pt received 1 dose rocephin iv.  Awaiting urinalysis.  Pt will be admitted for AMS.    Review of systems:    In addition to the HPI above, unable to obtain clearly due to somnolence of patient.   No Fever-chills, No Headache, No changes with Vision or hearing, No problems swallowing food or Liquids, No Chest pain, Cough or Shortness of Breath, No Abdominal pain, No Nausea or Vomiting, bowel movements are regular, No Blood in stool or Urine, No dysuria, No new skin rashes or bruises, No new joints pains-aches,  No new weakness, tingling, numbness in any extremity, No recent weight gain or loss, No polyuria, polydypsia or polyphagia, No significant Mental Stressors.  All other systems reviewed and are negative.    Past History of the following :    Past Medical History:  Diagnosis Date  . Diabetes mellitus without complication ([place])   . Diabetic neuropathy ([place])   . Hypertension   . Stroke ([place])   . Urinary incontinence       Past Surgical History:  Procedure Laterality  Date  . ABDOMINAL HYSTERECTOMY    . APPENDECTOMY    . BREAST SURGERY     begnin tumor removed  . CATARACT EXTRACTION, BILATERAL        Social History:      Social History   Tobacco Use  . Smoking status: Never Smoker  . Smokeless tobacco: Never Used  Substance Use Topics  . Alcohol use: No       Family History :     Family History  Problem Relation Age of Onset  . CAD Mother   . Hypertension Mother   . Stroke Father   . Heart attack Brother   . Cancer Brother        Home Medications:   Prior to Admission medications   Medication Sig Start Date End Date Taking? Authorizing Provider  amLODipine (NORVASC) 5 MG tablet Take 1 tablet (5 mg total) by mouth daily for 30 days. 03/26/19 06/01/19  Manuella Ghazi, Pratik D, DO  Cholecalciferol (VITAMIN D3) 5000 units CAPS Take  1 capsule (5,000 Units total) by mouth daily. Patient taking differently: Take 5,000 Units by mouth every evening.  04/15/17   Nida, Marella Chimes, MD  Cyanocobalamin (B-12 PO) Take 1 tablet by mouth every evening.    [provider]  insulin glargine (LANTUS) 100 UNIT/ML injection Inject 0.25 mLs (25 Units total) into the skin 2 (two) times daily. 05/11/19   Cassandria Anger, MD  metoprolol succinate (TOPROL-XL) 50 MG 24 hr tablet Take 50 mg by mouth at bedtime. Take with or immediately following a meal.     [provider]  MYRBETRIQ 50 MG TB24 tablet Take 50 mg by mouth every evening.  09/02/14   [provider]  nystatin (MYCOSTATIN/NYSTOP) powder Apply topically 2 (two) times daily. Apply to affected skin for treatment of fungal infection. Please keep area clean and dry. 11/30/18   Barton Dubois, MD  simvastatin (ZOCOR) 20 MG tablet Take 20 mg by mouth every evening.    [provider]  UNIFINE PENTIPS 31G X 8 MM MISC USE ONCE DAILY AS DIRECTED 03/05/19   Cassandria Anger, MD     Allergies:     Allergies  Allergen Reactions  . Codeine Nausea And Vomiting      Physical Exam:   Vitals  Blood pressure (!) 126/103, pulse 80, temperature 97.7 F (36.5 C), temperature source Oral, resp. rate 15, height _0  (1.676 m), weight 94.2 kg, SpO2 93 %.  1.  General: axox3 (person, place, and year)  2. Psychiatric: euthymic  3. Neurologic: cn2-12 intact, reflexes 2+ symmetric, diffuse with no clonus, motor 5/5 in all 4 ext  4. HEENMT:  Anicteric, pupils 1.23m symmetric, direct, consensual, near intact Neck : no jvd, no bruit  5. Respiratory : CTAB  6. Cardiovascular : rrr s1, s2  7. Gastrointestinal:  Abd: obese, (morbid), soft, nt, nd, +bs  8. Skin:  Ext: no c/c/e,   No rash  9.Musculoskeletal:  Good ROM    No adenopathy    Data Review:    CBC Recent Labs  Lab 07/13/19 2035  WBC 10.7*  HGB 14.2  HCT 40.0  PLT 169  MCV 83.5  MCH 29.6  MCHC 35.5  RDW 12.4   ------------------------------------------------------------------------------------------------------------------  Results for orders placed or performed during the hospital encounter of 07/13/19 (from the past 48 hour(s))  CBG monitoring, ED     Status: Abnormal   Collection Time: 07/13/19  7:35 PM  Result Value Ref Range   Glucose-Capillary 433 (H) 70 - 99 mg/dL  CBC     Status: Abnormal   Collection Time: 07/13/19  8:35 PM  Result Value Ref Range   WBC 10.7 (H) 4.0 - 10.5 K/uL   RBC 4.79 3.87 - 5.11 MIL/uL   Hemoglobin 14.2 12.0 - 15.0 g/dL   HCT 40.0 36.0 - 46.0 %   MCV 83.5 80.0 - 100.0 fL   MCH 29.6 26.0 - 34.0 pg   MCHC 35.5 30.0 - 36.0 g/dL   RDW 12.4 11.5 - 15.5 %   Platelets 169 150 - 400 K/uL    Comment: PLATELET COUNT CONFIRMED BY SMEAR   nRBC 0.0 0.0 - 0.2 %    Comment: Performed at [facility] [address] [place] [id number] Basic metabolic panel     Status: Abnormal   Collection Time: 07/13/19  8:35 PM  Result Value Ref Range   Sodium 132 (L) 135 - 145 mmol/L   Potassium 5.2 (H) 3.5 - 5.1 mmol/L  Chloride 99 98 - 111 mmol/L    CO2 21 (L) 22 - 32 mmol/L   Glucose, Bld 438 (H) 70 - 99 mg/dL   BUN 12 8 - 23 mg/dL   Creatinine, Ser 0.80 0.44 - 1.00 mg/dL   Calcium 9.3 8.9 - 10.3 mg/dL   GFR calc non Af Amer >60 >60 mL/min   GFR calc Af Amer >60 >60 mL/min   Anion gap 12 5 - 15    Comment: Performed at [facility], [address], [place], [place] [address]  SARS Coronavirus 2 (CEPHEID- Performed in [place] hospital lab), Hosp Order     Status: None   Collection Time: 07/13/19  9:31 PM   Specimen: Nasopharyngeal Swab  Result Value Ref Range   SARS Coronavirus 2 NEGATIVE NEGATIVE    Comment: (NOTE) If result is NEGATIVE SARS-CoV-2 target nucleic acids are NOT DETECTED. The SARS-CoV-2 RNA is generally detectable in upper and lower  respiratory specimens during the acute phase of infection. The lowest  concentration of SARS-CoV-2 viral copies this assay can detect is 250  copies / mL. A negative result does not preclude SARS-CoV-2 infection  and should not be used as the sole basis for treatment or other  patient management decisions.  A negative result may occur with  improper specimen collection / handling, submission of specimen other  than nasopharyngeal swab, presence of viral mutation(s) within the  areas targeted by this assay, and inadequate number of viral copies  (<250 copies / mL). A negative result must be combined with clinical  observations, patient history, and epidemiological information. If result is POSITIVE SARS-CoV-2 target nucleic acids are DETECTED. The SARS-CoV-2 RNA is generally detectable in upper and lower  respiratory specimens dur ing the acute phase of infection.  Positive  results are indicative of active infection with SARS-CoV-2.  Clinical  correlation with patient history and other diagnostic information is  necessary to determine patient infection status.  Positive results do  not rule out bacterial infection or co-infection with other viruses. If result is PRESUMPTIVE  POSTIVE SARS-CoV-2 nucleic acids MAY BE PRESENT.   A presumptive positive result was obtained on the submitted specimen  and confirmed on repeat testing.  While 2019 novel coronavirus  (SARS-CoV-2) nucleic acids may be present in the submitted sample  additional confirmatory testing may be necessary for epidemiological  and / or clinical management purposes  to differentiate between  SARS-CoV-2 and other Sarbecovirus currently known to infect humans.  If clinically indicated additional testing with an alternate test  methodology [phone]) is advised. The SARS-CoV-2 RNA is generally  detectable in upper and lower respiratory sp ecimens during the acute  phase of infection. The expected result is Negative. Fact Sheet for Patients:  StrictlyIdeas.no Fact Sheet for Healthcare Providers: BankingDealers.co.za This test is not yet approved or cleared by the [place] FDA and has been authorized for detection and/or diagnosis of SARS-CoV-2 by FDA under an Emergency Use Authorization (EUA).  This EUA will remain in effect (meaning this test can be used) for the duration of the COVID-19 declaration under Section 564(b)(1) of the Act, 21 U.S.C. section 360bbb-3(b)(1), unless the authorization is terminated or revoked sooner. Performed at [facility], [address] [place], [place] [address]     Chemistries  Recent Labs  Lab 07/13/19 2035  NA 132*  K 5.2*  CL 99  CO2 21*  GLUCOSE 438*  BUN 12  CREATININE 0.80  CALCIUM 9.3   ------------------------------------------------------------------------------------------------------------------  ------------------------------------------------------------------------------------------------------------------  GFR: Estimated Creatinine Clearance: 57.3 mL/min (by C-G formula based on SCr of 0.8 mg/dL). Liver Function Tests: No results for input(s): AST, ALT, ALKPHOS, BILITOT, PROT, ALBUMIN  in the last 168 hours. No results for input(s): LIPASE, AMYLASE in the last 168 hours. No results for input(s): AMMONIA in the last 168 hours. Coagulation Profile: No results for input(s): INR, PROTIME in the last 168 hours. Cardiac Enzymes: No results for input(s): CKTOTAL, CKMB, CKMBINDEX, TROPONINI in the last 168 hours. BNP (last 3 results) No results for input(s): PROBNP in the last 8760 hours. HbA1C: No results for input(s): HGBA1C in the last 72 hours. CBG: Recent Labs  Lab 07/13/19 1935  GLUCAP 433*   Lipid Profile: No results for input(s): CHOL, HDL, LDLCALC, TRIG, CHOLHDL, LDLDIRECT in the last 72 hours. Thyroid Function Tests: No results for input(s): TSH, T4TOTAL, FREET4, T3FREE, THYROIDAB in the last 72 hours. Anemia Panel: No results for input(s): VITAMINB12, FOLATE, FERRITIN, TIBC, IRON, RETICCTPCT in the last 72 hours.  --------------------------------------------------------------------------------------------------------------- Urine analysis:    Component Value Date/Time   COLORURINE YELLOW 03/24/2019 1136   APPEARANCEUR CLEAR 03/24/2019 1136   LABSPEC 1.020 03/24/2019 1136   PHURINE 5.0 03/24/2019 1136   GLUCOSEU >=500 (A) 03/24/2019 1136   HGBUR SMALL (A) 03/24/2019 1136   BILIRUBINUR NEGATIVE 03/24/2019 1136   KETONESUR 80 (A) 03/24/2019 1136   PROTEINUR NEGATIVE 03/24/2019 1136   UROBILINOGEN 0.2 09/06/2009 0135   NITRITE NEGATIVE 03/24/2019 1136   LEUKOCYTESUR NEGATIVE 03/24/2019 1136      Imaging Results:    Ct Head Wo Contrast  Result Date: 07/13/2019 CLINICAL DATA:  85 year old female with hyperglycemia. Altered mental status. EXAM: CT HEAD WITHOUT CONTRAST TECHNIQUE: Contiguous axial images were obtained from the base of the skull through the vertex without intravenous contrast. COMPARISON:  Head CT dated 04/20/2019 FINDINGS: Brain: There is moderate age-related atrophy and chronic microvascular ischemic changes. There is no acute  intracranial hemorrhage. No mass effect or midline shift. No extra-axial fluid collection. Vascular: No hyperdense vessel or unexpected calcification. Skull: Normal. Negative for fracture or focal lesion. Sinuses/Orbits: No acute finding. Other: None IMPRESSION: 1. No acute intracranial hemorrhage. 2. Moderate age-related atrophy and chronic microvascular ischemic changes. Electronically Signed   By: Anner Crete M.D.   On: 07/13/2019 23:23   Dg Chest Port 1 View  Result Date: 07/13/2019 CLINICAL DATA:  85 year old female with altered mental status. EXAM: PORTABLE CHEST 1 VIEW COMPARISON:  Chest radiograph dated 03/24/2019 FINDINGS: Mild chronic interstitial coarsening. No focal consolidation, pleural effusion, or pneumothorax. Stable cardiac silhouette. There is apparent widening of the mediastinum, likely related to patient position and rotation. No acute osseous pathology. IMPRESSION: No active disease. Electronically Signed   By: Anner Crete M.D.   On: 07/13/2019 21:27       Assessment & Plan:    Principal Problem:   Altered mental status Active Problems:   Essential hypertension, benign   Uncontrolled type 2 diabetes mellitus with hyperglycemia (HCC)   Diabetic neuropathy (HCC)  Altered mental status Awaiting urinalysis, results might not be correct due to rocephin given in ED Check b12, esr, ana, rpr, tsh, ammonia, urine drug screen Please consider neurology consult if urinalysis not positive.   Hyperglycemia, borderline mild DKA Iv insulin Iv normal saline Check bmp at 0030, and then cmp at 0500  Hypertension Cont Amlodipine 57m po qday Cont Toprol XL 539mpo qday  Hyperlipidemia Cont Simvastatin 2049mo qday  Dm2 Hold Lantus, can restart when stopping iv insulin.  Urinary incontinence Cont Myrbetriq 84m po qday  DVT Prophylaxis-   Lovenox - SCDs  AM Labs Ordered, also please review Full Orders  Family Communication: Admission, patients condition and plan  of care including tests being ordered have been discussed with the patient  who indicate understanding and agree with the plan and Code Status.  Code Status:  FULL CODE,  Left message for husband that patient would be admitted for AMS to [facility] Admission status: Observation: Based on patients clinical presentation and evaluation of above clinical data, I have made determination that patient meets observation criteria at this time.   Time spent in minutes : 70 minutes  JJani GravelM.D on 07/13/2019 at 11:42 PM

## 2019-07-13 NOTE — ED Provider Notes (Signed)
[facility] EMERGENCY DEPARTMENT Provider Note   CSN: [id number] Arrival date & time: 07/13/19  1931    History   Chief Complaint Chief Complaint  Patient presents with  . Hyperglycemia    HPI [name] is a 85 y.o. female.     Level 5 caveat for acuity of condition.  Patient presents with altered mental status.  Glucose greater than 500 and no insulin today per family report.  Review of systems positive for low-grade fever.  No other history available at this time.     Past Medical History:  Diagnosis Date  . Diabetes mellitus without complication ([place])   . Diabetic neuropathy ([place])   . Hypertension   . Stroke ([place])   . Urinary incontinence     Patient Active Problem List   Diagnosis Date Noted  . Acute encephalopathy 03/24/2019  . Hypotension 02/07/2019  . Slurred speech 11/17/2018  . Diabetic neuropathy ([place]) 11/17/2018  . AKI (acute kidney injury) ([place]) 11/17/2018  . Uncontrolled type 2 diabetes mellitus with hyperglycemia ([place]) 06/11/2018  . Hyperlipidemia 02/20/2016  . Essential hypertension, benign 10/10/2015  . Vitamin D deficiency 10/10/2015  . Class 1 obesity due to excess calories without serious comorbidity with body mass index (BMI) of 31.0 to 31.9 in adult 10/10/2015  . HIP PAIN 01/12/2009  . SPINAL STENOSIS 01/12/2009  . LOW BACK PAIN 01/12/2009  . SPONDYLOLYSIS 01/12/2009  . SHOULDER PAIN 10/04/2008  . Uncontrolled type 2 diabetes mellitus with complication, without long-term current use of insulin ([place]) 10/01/2008    Past Surgical History:  Procedure Laterality Date  . ABDOMINAL HYSTERECTOMY    . APPENDECTOMY    . BREAST SURGERY     begnin tumor removed  . CATARACT EXTRACTION, BILATERAL       OB History   No obstetric history on file.      Home Medications    Prior to Admission medications   Medication Sig Start Date End Date Taking? Authorizing Provider  amLODipine (NORVASC) 5 MG tablet Take 1 tablet (5 mg total) by  mouth daily for 30 days. 03/26/19 06/01/19  Manuella Ghazi, Pratik D, DO  Cholecalciferol (VITAMIN D3) 5000 units CAPS Take 1 capsule (5,000 Units total) by mouth daily. Patient taking differently: Take 5,000 Units by mouth every evening.  04/15/17   Nida, Marella Chimes, MD  Cyanocobalamin (B-12 PO) Take 1 tablet by mouth every evening.    [provider]  insulin glargine (LANTUS) 100 UNIT/ML injection Inject 0.25 mLs (25 Units total) into the skin 2 (two) times daily. 05/11/19   Cassandria Anger, MD  metoprolol succinate (TOPROL-XL) 50 MG 24 hr tablet Take 50 mg by mouth at bedtime. Take with or immediately following a meal.     [provider]  MYRBETRIQ 50 MG TB24 tablet Take 50 mg by mouth every evening.  09/02/14   [provider]  nystatin (MYCOSTATIN/NYSTOP) powder Apply topically 2 (two) times daily. Apply to affected skin for treatment of fungal infection. Please keep area clean and dry. 11/30/18   Barton Dubois, MD  simvastatin (ZOCOR) 20 MG tablet Take 20 mg by mouth every evening.    [provider]  UNIFINE PENTIPS 31G X 8 MM MISC USE ONCE DAILY AS DIRECTED 03/05/19   Cassandria Anger, MD    Family History Family History  Problem Relation Age of Onset  . CAD Mother   . Hypertension Mother   . Stroke Father   . Heart attack Brother   .  Cancer Brother     Social History Social History   Tobacco Use  . Smoking status: Never Smoker  . Smokeless tobacco: Never Used  Substance Use Topics  . Alcohol use: No  . Drug use: No     Allergies   Codeine   Review of Systems Review of Systems  Unable to perform ROS: Acuity of condition     Physical Exam Updated Vital Signs BP (!) 126/103   Pulse 80   Temp 97.7 F (36.5 C) (Oral)   Resp 15   Ht 5\' 6"  (1.676 m)   Wt 94.2 kg   SpO2 93%   BMI 33.52 kg/m   Physical Exam Vitals signs and nursing note reviewed.  Constitutional:      Appearance: She is well-developed.     Comments:  Confused  HENT:     Head: Normocephalic and atraumatic.  Eyes:     Conjunctiva/sclera: Conjunctivae normal.  Neck:     Musculoskeletal: Neck supple.  Cardiovascular:     Rate and Rhythm: Normal rate and regular rhythm.  Pulmonary:     Effort: Pulmonary effort is normal.     Breath sounds: Normal breath sounds.  Abdominal:     General: Bowel sounds are normal.     Palpations: Abdomen is soft.  Musculoskeletal:     Comments: Unable  Skin:    General: Skin is warm and dry.  Neurological:     Comments: Unable  Psychiatric:     Comments: Confused      ED Treatments / Results  Labs (all labs ordered are listed, but only abnormal results are displayed) Labs Reviewed  CBC - Abnormal; Notable for the following components:      Result Value   WBC 10.7 (*)    All other components within normal limits  BASIC METABOLIC PANEL - Abnormal; Notable for the following components:   Sodium 132 (*)    Potassium 5.2 (*)    CO2 21 (*)    Glucose, Bld 438 (*)    All other components within normal limits  CBG MONITORING, ED - Abnormal; Notable for the following components:   Glucose-Capillary 433 (*)    All other components within normal limits  SARS CORONAVIRUS 2 (HOSPITAL ORDER, [place] [facility])  URINALYSIS, ROUTINE W REFLEX MICROSCOPIC    EKG EKG Interpretation  Date/Time:  Monday July 13 2019 19:43:46 EDT Ventricular Rate:  82 PR Interval:    QRS Duration: 129 QT Interval:  402 QTC Calculation: 470 R Axis:   175 Text Interpretation:  Sinus rhythm Right bundle branch block Baseline wander in lead(s) I Confirmed by Nat Christen [phone]) on 07/13/2019 9:29:59 PM   Radiology Dg Chest Port 1 View  Result Date: 07/13/2019 CLINICAL DATA:  85 year old female with altered mental status. EXAM: PORTABLE CHEST 1 VIEW COMPARISON:  Chest radiograph dated 03/24/2019 FINDINGS: Mild chronic interstitial coarsening. No focal consolidation, pleural effusion, or  pneumothorax. Stable cardiac silhouette. There is apparent widening of the mediastinum, likely related to patient position and rotation. No acute osseous pathology. IMPRESSION: No active disease. Electronically Signed   By: Anner Crete M.D.   On: 07/13/2019 21:27    Procedures Procedures (including critical care time)  Medications Ordered in ED Medications  sodium chloride 0.9 % bolus 1,000 mL (1,000 mLs Intravenous New Bag/Given 07/13/19 2106)     Initial Impression / Assessment and Plan / ED Course  I have reviewed the triage vital signs and the nursing notes.  Pertinent labs & imaging results that were available during my care of the patient were reviewed by me and considered in my medical decision making (see chart for details).        Patient presents with fever and hyperglycemia.  Chest x-ray negative for pneumonia.  Mild DKA per labs.  Will hydrate.  Urine sample pending.  IV Rocephin for presumptive UTI.  Admit to general medicine.  CRITICAL CARE Performed by: Nat Christen Total critical care time: 30 minutes Critical care time was exclusive of separately billable procedures and treating other patients. Critical care was necessary to treat or prevent imminent or life-threatening deterioration. Critical care was time spent personally by me on the following activities: development of treatment plan with patient and/or surrogate as well as nursing, discussions with consultants, evaluation of patient's response to treatment, examination of patient, obtaining history from patient or surrogate, ordering and performing treatments and interventions, ordering and review of laboratory studies, ordering and review of radiographic studies, pulse oximetry and re-evaluation of patient's condition.  Final Clinical Impressions(s) / ED Diagnoses   Final diagnoses:  Altered mental status, unspecified altered mental status type  Fever, unspecified fever cause  Hyperglycemia    ED  Discharge Orders    None       Nat Christen, MD 07/13/19 2231

## 2019-07-13 NOTE — ED Triage Notes (Signed)
Patient brought in by EMS for hyperglycemia. EMS reports a CBG reading of HI. Family report a reading of 552. Family states that patient has not had her insulin today. Patient is alert but confused.

## 2019-07-13 NOTE — ED Notes (Signed)
Patient transported to CT 

## 2019-07-14 ENCOUNTER — Encounter (HOSPITAL_COMMUNITY): Payer: Self-pay | Admitting: *Deleted

## 2019-07-14 ENCOUNTER — Other Ambulatory Visit: Payer: Self-pay

## 2019-07-14 DIAGNOSIS — R739 Hyperglycemia, unspecified: Secondary | ICD-10-CM | POA: Diagnosis not present

## 2019-07-14 DIAGNOSIS — E1110 Type 2 diabetes mellitus with ketoacidosis without coma: Secondary | ICD-10-CM | POA: Diagnosis not present

## 2019-07-14 DIAGNOSIS — Z8673 Personal history of transient ischemic attack (TIA), and cerebral infarction without residual deficits: Secondary | ICD-10-CM | POA: Diagnosis not present

## 2019-07-14 DIAGNOSIS — Z9842 Cataract extraction status, left eye: Secondary | ICD-10-CM | POA: Diagnosis not present

## 2019-07-14 DIAGNOSIS — E1165 Type 2 diabetes mellitus with hyperglycemia: Secondary | ICD-10-CM | POA: Diagnosis not present

## 2019-07-14 DIAGNOSIS — Z9841 Cataract extraction status, right eye: Secondary | ICD-10-CM | POA: Diagnosis not present

## 2019-07-14 DIAGNOSIS — Z9181 History of falling: Secondary | ICD-10-CM | POA: Diagnosis not present

## 2019-07-14 DIAGNOSIS — I4510 Unspecified right bundle-branch block: Secondary | ICD-10-CM | POA: Diagnosis present

## 2019-07-14 DIAGNOSIS — Z794 Long term (current) use of insulin: Secondary | ICD-10-CM | POA: Diagnosis not present

## 2019-07-14 DIAGNOSIS — G9341 Metabolic encephalopathy: Secondary | ICD-10-CM | POA: Diagnosis not present

## 2019-07-14 DIAGNOSIS — R4182 Altered mental status, unspecified: Secondary | ICD-10-CM | POA: Diagnosis present

## 2019-07-14 DIAGNOSIS — I10 Essential (primary) hypertension: Secondary | ICD-10-CM | POA: Diagnosis not present

## 2019-07-14 DIAGNOSIS — E1140 Type 2 diabetes mellitus with diabetic neuropathy, unspecified: Secondary | ICD-10-CM | POA: Diagnosis present

## 2019-07-14 DIAGNOSIS — Z8249 Family history of ischemic heart disease and other diseases of the circulatory system: Secondary | ICD-10-CM | POA: Diagnosis not present

## 2019-07-14 DIAGNOSIS — Z9114 Patient's other noncompliance with medication regimen: Secondary | ICD-10-CM | POA: Diagnosis not present

## 2019-07-14 DIAGNOSIS — L03119 Cellulitis of unspecified part of limb: Secondary | ICD-10-CM

## 2019-07-14 DIAGNOSIS — R32 Unspecified urinary incontinence: Secondary | ICD-10-CM | POA: Diagnosis present

## 2019-07-14 DIAGNOSIS — L03317 Cellulitis of buttock: Secondary | ICD-10-CM

## 2019-07-14 DIAGNOSIS — F0390 Unspecified dementia without behavioral disturbance: Secondary | ICD-10-CM | POA: Diagnosis present

## 2019-07-14 DIAGNOSIS — Z823 Family history of stroke: Secondary | ICD-10-CM | POA: Diagnosis not present

## 2019-07-14 DIAGNOSIS — L03116 Cellulitis of left lower limb: Secondary | ICD-10-CM | POA: Diagnosis present

## 2019-07-14 DIAGNOSIS — Z90710 Acquired absence of both cervix and uterus: Secondary | ICD-10-CM | POA: Diagnosis not present

## 2019-07-14 DIAGNOSIS — Z9109 Other allergy status, other than to drugs and biological substances: Secondary | ICD-10-CM | POA: Diagnosis not present

## 2019-07-14 DIAGNOSIS — Z20828 Contact with and (suspected) exposure to other viral communicable diseases: Secondary | ICD-10-CM | POA: Diagnosis present

## 2019-07-14 DIAGNOSIS — E785 Hyperlipidemia, unspecified: Secondary | ICD-10-CM | POA: Diagnosis present

## 2019-07-14 DIAGNOSIS — R2689 Other abnormalities of gait and mobility: Secondary | ICD-10-CM | POA: Diagnosis present

## 2019-07-14 DIAGNOSIS — E876 Hypokalemia: Secondary | ICD-10-CM | POA: Diagnosis present

## 2019-07-14 DIAGNOSIS — Z79899 Other long term (current) drug therapy: Secondary | ICD-10-CM | POA: Diagnosis not present

## 2019-07-14 LAB — TSH: TSH: 2.057 u[IU]/mL (ref 0.350–4.500)

## 2019-07-14 LAB — URINALYSIS, ROUTINE W REFLEX MICROSCOPIC
Bacteria, UA: NONE SEEN
Bilirubin Urine: NEGATIVE
Glucose, UA: >=500 mg/dL — AB
Hgb urine dipstick: NEGATIVE
Ketones, ur: 5 mg/dL — AB
Leukocytes,Ua: NEGATIVE
Nitrite: NEGATIVE
Protein, ur: NEGATIVE mg/dL
Specific Gravity, Urine: 1.026 (ref 1.005–1.030)
pH: 7 (ref 5.0–8.0)

## 2019-07-14 LAB — CBC
HCT: 39.6 % (ref 36.0–46.0)
Hemoglobin: 13.4 g/dL (ref 12.0–15.0)
MCH: 29.8 pg (ref 26.0–34.0)
MCHC: 33.8 g/dL (ref 30.0–36.0)
MCV: 88.2 fL (ref 80.0–100.0)
Platelets: 251 10*3/uL (ref 150–400)
RBC: 4.49 MIL/uL (ref 3.87–5.11)
RDW: 12.8 % (ref 11.5–15.5)
WBC: 10 10*3/uL (ref 4.0–10.5)
nRBC: 0 % (ref 0.0–0.2)

## 2019-07-14 LAB — COMPREHENSIVE METABOLIC PANEL
ALT: 15 U/L (ref 0–44)
AST: 16 U/L (ref 15–41)
Albumin: 3.1 g/dL — ABNORMAL LOW (ref 3.5–5.0)
Alkaline Phosphatase: 89 U/L (ref 38–126)
Anion gap: 10 (ref 5–15)
BUN: 11 mg/dL (ref 8–23)
CO2: 25 mmol/L (ref 22–32)
Calcium: 9 mg/dL (ref 8.9–10.3)
Chloride: 105 mmol/L (ref 98–111)
Creatinine, Ser: 0.78 mg/dL (ref 0.44–1.00)
GFR calc Af Amer: >60 mL/min (ref >60)
GFR calc non Af Amer: >60 mL/min (ref >60)
Glucose, Bld: 144 mg/dL — ABNORMAL HIGH (ref 70–99)
Potassium: 3.2 mmol/L — ABNORMAL LOW (ref 3.5–5.1)
Sodium: 140 mmol/L (ref 135–145)
Total Bilirubin: 1 mg/dL (ref 0.3–1.2)
Total Protein: 6 g/dL — ABNORMAL LOW (ref 6.5–8.1)

## 2019-07-14 LAB — RAPID URINE DRUG SCREEN, HOSP PERFORMED
Amphetamines: NOT DETECTED
Barbiturates: NOT DETECTED
Benzodiazepines: NOT DETECTED
Cocaine: NOT DETECTED
Opiates: NOT DETECTED
Tetrahydrocannabinol: NOT DETECTED

## 2019-07-14 LAB — CBG MONITORING, ED
Glucose-Capillary: 266 mg/dL — ABNORMAL HIGH (ref 70–99)
Glucose-Capillary: 272 mg/dL — ABNORMAL HIGH (ref 70–99)
Glucose-Capillary: 154 mg/dL — ABNORMAL HIGH (ref 70–99)
Glucose-Capillary: 124 mg/dL — ABNORMAL HIGH (ref 70–99)
Glucose-Capillary: 100 mg/dL — ABNORMAL HIGH (ref 70–99)

## 2019-07-14 LAB — BASIC METABOLIC PANEL
Anion gap: 9 (ref 5–15)
BUN: 11 mg/dL (ref 8–23)
CO2: 25 mmol/L (ref 22–32)
Calcium: 9.1 mg/dL (ref 8.9–10.3)
Chloride: 101 mmol/L (ref 98–111)
Creatinine, Ser: 0.81 mg/dL (ref 0.44–1.00)
GFR calc Af Amer: >60 mL/min (ref >60)
GFR calc non Af Amer: >60 mL/min (ref >60)
Glucose, Bld: 331 mg/dL — ABNORMAL HIGH (ref 70–99)
Potassium: 3.5 mmol/L (ref 3.5–5.1)
Sodium: 135 mmol/L (ref 135–145)

## 2019-07-14 LAB — GLUCOSE, CAPILLARY
Glucose-Capillary: 199 mg/dL — ABNORMAL HIGH (ref 70–99)
Glucose-Capillary: 369 mg/dL — ABNORMAL HIGH (ref 70–99)
Glucose-Capillary: 321 mg/dL — ABNORMAL HIGH (ref 70–99)
Glucose-Capillary: 253 mg/dL — ABNORMAL HIGH (ref 70–99)

## 2019-07-14 LAB — AMMONIA: Ammonia: 18 umol/L (ref 9–35)

## 2019-07-14 LAB — HEMOGLOBIN A1C
Hgb A1c MFr Bld: 12.3 % — ABNORMAL HIGH (ref 4.8–5.6)
Mean Plasma Glucose: 306.31 mg/dL

## 2019-07-14 LAB — VITAMIN B12: Vitamin B-12: 2018 pg/mL — ABNORMAL HIGH (ref 180–914)

## 2019-07-14 LAB — SEDIMENTATION RATE: Sed Rate: 19 mm/hr (ref 0–22)

## 2019-07-14 MED ORDER — METOPROLOL SUCCINATE ER 50 MG PO TB24
50.0000 mg | ORAL_TABLET | Freq: Every day | ORAL | Status: DC
  Administered 2019-07-14: 50 mg via ORAL
  Filled 2019-07-14: qty 1

## 2019-07-14 MED ORDER — INSULIN ASPART 100 UNIT/ML ~~LOC~~ SOLN
0.0000 [IU] | Freq: Three times a day (TID) | SUBCUTANEOUS | Status: DC
  Administered 2019-07-14: 7 [IU] via SUBCUTANEOUS
  Administered 2019-07-14: 2 [IU] via SUBCUTANEOUS
  Administered 2019-07-14: 9 [IU] via SUBCUTANEOUS
  Administered 2019-07-15: 3 [IU] via SUBCUTANEOUS
  Administered 2019-07-15: 5 [IU] via SUBCUTANEOUS
  Administered 2019-07-15: 2 [IU] via SUBCUTANEOUS

## 2019-07-14 MED ORDER — VITAMIN B-12 1000 MCG PO TABS
2000.0000 ug | ORAL_TABLET | Freq: Every evening | ORAL | Status: DC
  Administered 2019-07-14: 2000 ug via ORAL
  Filled 2019-07-14: qty 2

## 2019-07-14 MED ORDER — AMLODIPINE BESYLATE 5 MG PO TABS
5.0000 mg | ORAL_TABLET | Freq: Every day | ORAL | Status: DC
  Administered 2019-07-14 – 2019-07-15 (×2): 5 mg via ORAL
  Filled 2019-07-14 (×2): qty 1

## 2019-07-14 MED ORDER — INSULIN ASPART 100 UNIT/ML ~~LOC~~ SOLN
0.0000 [IU] | Freq: Every day | SUBCUTANEOUS | Status: DC
  Administered 2019-07-14: 2 [IU] via SUBCUTANEOUS

## 2019-07-14 MED ORDER — SIMVASTATIN 20 MG PO TABS
20.0000 mg | ORAL_TABLET | Freq: Every evening | ORAL | Status: DC
  Administered 2019-07-14: 20 mg via ORAL
  Filled 2019-07-14: qty 1

## 2019-07-14 MED ORDER — SODIUM CHLORIDE 0.9 % IV SOLN
1.0000 g | INTRAVENOUS | Status: DC
  Filled 2019-07-14: qty 10

## 2019-07-14 MED ORDER — CEFAZOLIN SODIUM-DEXTROSE 1-4 GM/50ML-% IV SOLN
1.0000 g | Freq: Three times a day (TID) | INTRAVENOUS | Status: DC
  Administered 2019-07-14 – 2019-07-15 (×4): 1 g via INTRAVENOUS
  Filled 2019-07-14 (×7): qty 50

## 2019-07-14 MED ORDER — NYSTATIN 100000 UNIT/GM EX POWD
Freq: Two times a day (BID) | CUTANEOUS | Status: DC
  Administered 2019-07-14 – 2019-07-15 (×3): via TOPICAL
  Filled 2019-07-14: qty 15

## 2019-07-14 MED ORDER — INSULIN GLARGINE 100 UNIT/ML ~~LOC~~ SOLN
10.0000 [IU] | Freq: Every day | SUBCUTANEOUS | Status: DC
  Administered 2019-07-14: 10 [IU] via SUBCUTANEOUS
  Filled 2019-07-14 (×3): qty 0.1

## 2019-07-14 MED ORDER — MIRABEGRON ER 25 MG PO TB24
50.0000 mg | ORAL_TABLET | Freq: Every evening | ORAL | Status: DC
  Administered 2019-07-14: 50 mg via ORAL
  Filled 2019-07-14: qty 2

## 2019-07-14 MED ORDER — VITAMIN D 25 MCG (1000 UNIT) PO TABS
5000.0000 [IU] | ORAL_TABLET | Freq: Every evening | ORAL | Status: DC
  Administered 2019-07-14: 5000 [IU] via ORAL
  Filled 2019-07-14: qty 5

## 2019-07-14 MED ORDER — POTASSIUM CHLORIDE IN NACL 20-0.9 MEQ/L-% IV SOLN
INTRAVENOUS | Status: AC
  Administered 2019-07-14 – 2019-07-15 (×2): via INTRAVENOUS

## 2019-07-14 NOTE — Progress Notes (Signed)
Rocephin given by ED, prior to urinalysis being collected Will continue rocephin iv

## 2019-07-14 NOTE — ED Notes (Signed)
Report called patient to go upstairs to floor after 07:30.

## 2019-07-14 NOTE — ED Notes (Signed)
Dr. Maudie Mercury advised of blood glucose and advised he to stop insulin drip.

## 2019-07-14 NOTE — ED Notes (Signed)
Dr. Maudie Mercury advised he would change patient bed to tele.

## 2019-07-14 NOTE — Progress Notes (Signed)
Inpatient Diabetes Program Recommendations  AACE/ADA: New Consensus Statement on Inpatient Glycemic Control   Target Ranges:  Prepandial:   less than 140 mg/dL      Peak postprandial:   less than 180 mg/dL (1-2 hours)      Critically ill patients:  140 - 180 mg/dL   Results for [name], [name] (MRN [id number]) as of 07/14/2019 07:55  Ref. Range 07/13/2019 19:35 07/13/2019 23:58 07/14/2019 01:04 07/14/2019 02:01 07/14/2019 03:11 07/14/2019 04:17 07/14/2019 05:28  Glucose-Capillary Latest Ref Range: 70 - 99 mg/dL 433 (H) 332 (H) 266 (H) 272 (H) 154 (H) 124 (H) 100 (H)   Review of Glycemic Control  Diabetes history: DM2 Outpatient Diabetes medications: Lantus 25 units BID Current orders for Inpatient glycemic control: Novolog 0-9 units TID with meals, Novolog 0-5 units QHS  Inpatient Diabetes Program Recommendations:   Insulin - Basal: Noted IV insulin stopped around 5:57 am today and NO basal insulin was given. Please consider ordering Lantus 10 units Q24H starting now.  Correction (SSI): If patient remains NPO, please consider changing frequency of CBGs and Novolog correction to Q4H.  Thanks, Barnie Alderman, RN, MSN, CDE Diabetes Coordinator Inpatient Diabetes Program [phone] (Team Pager from 8am to 5pm)

## 2019-07-14 NOTE — ED Notes (Signed)
Patient had full linen change.

## 2019-07-14 NOTE — ED Notes (Signed)
D 5 stopped due to having 3 blood sugars consecutively under 250.

## 2019-07-14 NOTE — Progress Notes (Signed)
PROGRESS NOTE  [name] GEZ:[id number] DOB: 04/09/1929 DOA: 07/13/2019 PCP: Sharilyn Sites, MD  Brief History:  85 year old female with a history of tremor, dementia, hypertension, diabetes mellitus type 2, stroke presenting with altered mental status and elevated CBGs.  Unfortunately, the patient is unable to provide any significant history secondary to cognitive impairment.  This history is obtained from review the medical record and speaking with the patient spouse on the telephone.  Apparently, the patient manages her own medications, but her daughter lays out her medications out a week at a time for the patient to take.  The patient spouse does note that occasionally the patient misses her medications.  He stated that he does not manage, and he does not check vigilantly whether the patient takes her medications as indicated.  Nevertheless, he states that she has not had any fevers, chills, chest pain, shortness breath, headache, nausea, vomiting, diarrhea, domino pain.  He does note that she has had gait instability with falling twice in the past 2 weeks.  He noted that the patient was increasingly confused and somnolent on 07/13/2019.  As result he checked her CBG and it was 552.  As result, EMS was activated.  Notably, the patient had an admission to the hospital from 03/24/2019 to 03/26/2019 secondary to metabolic encephalopathy secondary to polypharmacy and poor compliance with medications.  In the emergency department, the patient had temperature up to 100.8 F.  She was hemodynamically stable saturating 100% room air.  Chest x-ray was negative for any acute findings.  CT of the brain was negative.  WBC was 10.7.  BMP showed a serum glucose of 438 with anion gap of 12.  Urinalysis showed ketonuria.  Patient was started on IV fluids and IV insulin.  Assessment/Plan: DKA type II -patient started on IV insulin with q 1 hour CBG check and q 4 hour BMPs -pt started on aggressive  fluid resuscitation -Electrolytes were monitored and repleted -transitioned to [place] insulin once anion gap closed -diet was advanced once anion gap closed -02/08/19 HbA1C--14.0 -continue IVF  Cellulitis left lower extremity -The patient fell 2 weeks prior to this admission causing abrasion on her left pretibial area -Start cefazolin -See pictures below -venous duplex  Acute metabolic encephalopathy -Secondary to DKA and infectious process -Improving with treatment of DKA and antibiotics -UA neg for pyuria  Uncontrolled diabetes mellitus type 2 with hyperglycemia -Check hemoglobin A1c -02/08/2019 hemoglobin A1c 40.0 -Start Lantus 10 units -NovoLog sliding scale  Essential hypertension -Continue amlodipine and metoprolol succinate  Hyperlipidemia -Continue Zocor  Dementia without behavioral disturbance -Discussed with the patient's spouse that family members should be managing the patient's medications closely and dispensing medications to her  Gait instability -PT evaluation -Patient has had frequent falls  Hypokalemia -replete -check mag     Disposition Plan:   Home in 1-2 days  Family Communication:   Spouse updated on phone  Consultants:  none  Code Status:  FULL   DVT Prophylaxis:  [place] Lovenox   Procedures: As Listed in Progress Note Above  Antibiotics: Ceftriaxone 7/20 Cefazolin 7/21       Subjective: Patient denies fevers, chills, headache, chest pain, dyspnea, nausea, vomiting, diarrhea, abdominal pain, dysuria, hematuria, hematochezia, and melena.   Objective: Vitals:   07/14/19 0600 07/14/19 0620 07/14/19 0630 07/14/19 0801  BP: 130/61  122/60 131/87  Pulse:  66 65 70  Resp: 18 13 15 16   Temp:    98  F (36.7 C)  TempSrc:    Oral  SpO2: 97% 96% 96% 98%  Weight:      Height:        Intake/Output Summary (Last 24 hours) at 07/14/2019 0851 Last data filed at 07/14/2019 0557 Gross per 24 hour  Intake 208.04 ml  Output -  Net 208.04  ml   Weight change:  Exam:   General:  Pt is alert, follows commands appropriately, not in acute distress  HEENT: No icterus, No thrush, No neck mass, [place]/AT  Cardiovascular: RRR, S1/S2, no rubs, no gallops  Respiratory: CTA bilaterally, no wheezing, no crackles, no rhonchi  Abdomen: Soft/+BS, non tender, non distended, no guarding  Extremities: 1 + LE edema, No lymphangitis, No petechiae, No rashes, no synovitis  LEFT LEG      Data Reviewed: I have personally reviewed following labs and imaging studies Basic Metabolic Panel: Recent Labs  Lab 07/13/19 2035 07/14/19 0058 07/14/19 0648  NA 132* 135 140  K 5.2* 3.5 3.2*  CL 99 101 105  CO2 21* 25 25  GLUCOSE 438* 331* 144*  BUN 12 11 11   CREATININE 0.80 0.81 0.78  CALCIUM 9.3 9.1 9.0   Liver Function Tests: Recent Labs  Lab 07/14/19 0648  AST 16  ALT 15  ALKPHOS 89  BILITOT 1.0  PROT 6.0*  ALBUMIN 3.1*   No results for input(s): LIPASE, AMYLASE in the last 168 hours. Recent Labs  Lab 07/14/19 0058  AMMONIA 18   Coagulation Profile: No results for input(s): INR, PROTIME in the last 168 hours. CBC: Recent Labs  Lab 07/13/19 2035 07/14/19 0648  WBC 10.7* 10.0  HGB 14.2 13.4  HCT 40.0 39.6  MCV 83.5 88.2  PLT 169 251   Cardiac Enzymes: No results for input(s): CKTOTAL, CKMB, CKMBINDEX, TROPONINI in the last 168 hours. BNP: Invalid input(s): POCBNP CBG: Recent Labs  Lab 07/14/19 0201 07/14/19 0311 07/14/19 0417 07/14/19 0528 07/14/19 0830  GLUCAP 272* 154* 124* 100* 199*   HbA1C: No results for input(s): HGBA1C in the last 72 hours. Urine analysis:    Component Value Date/Time   COLORURINE STRAW (A) 07/14/2019 0038   APPEARANCEUR CLEAR 07/14/2019 0038   LABSPEC 1.026 07/14/2019 0038   PHURINE 7.0 07/14/2019 0038   GLUCOSEU >=500 (A) 07/14/2019 0038   HGBUR NEGATIVE 07/14/2019 0038   BILIRUBINUR NEGATIVE 07/14/2019 0038   KETONESUR 5 (A) 07/14/2019 0038   PROTEINUR NEGATIVE  07/14/2019 0038   UROBILINOGEN 0.2 09/06/2009 0135   NITRITE NEGATIVE 07/14/2019 0038   LEUKOCYTESUR NEGATIVE 07/14/2019 0038   Sepsis Labs: @LABRCNTIP (procalcitonin:4,lacticidven:4) ) Recent Results (from the past 240 hour(s))  SARS Coronavirus 2 (CEPHEID- Performed in [place] hospital lab), Hosp Order     Status: None   Collection Time: 07/13/19  9:31 PM   Specimen: Nasopharyngeal Swab  Result Value Ref Range Status   SARS Coronavirus 2 NEGATIVE NEGATIVE Final    Comment: (NOTE) If result is NEGATIVE SARS-CoV-2 target nucleic acids are NOT DETECTED. The SARS-CoV-2 RNA is generally detectable in upper and lower  respiratory specimens during the acute phase of infection. The lowest  concentration of SARS-CoV-2 viral copies this assay can detect is 250  copies / mL. A negative result does not preclude SARS-CoV-2 infection  and should not be used as the sole basis for treatment or other  patient management decisions.  A negative result may occur with  improper specimen collection / handling, submission of specimen other  than nasopharyngeal swab, presence of viral  mutation(s) within the  areas targeted by this assay, and inadequate number of viral copies  (<250 copies / mL). A negative result must be combined with clinical  observations, patient history, and epidemiological information. If result is POSITIVE SARS-CoV-2 target nucleic acids are DETECTED. The SARS-CoV-2 RNA is generally detectable in upper and lower  respiratory specimens dur ing the acute phase of infection.  Positive  results are indicative of active infection with SARS-CoV-2.  Clinical  correlation with patient history and other diagnostic information is  necessary to determine patient infection status.  Positive results do  not rule out bacterial infection or co-infection with other viruses. If result is PRESUMPTIVE POSTIVE SARS-CoV-2 nucleic acids MAY BE PRESENT.   A presumptive positive result was  obtained on the submitted specimen  and confirmed on repeat testing.  While 2019 novel coronavirus  (SARS-CoV-2) nucleic acids may be present in the submitted sample  additional confirmatory testing may be necessary for epidemiological  and / or clinical management purposes  to differentiate between  SARS-CoV-2 and other Sarbecovirus currently known to infect humans.  If clinically indicated additional testing with an alternate test  methodology [id number]) is advised. The SARS-CoV-2 RNA is generally  detectable in upper and lower respiratory sp ecimens during the acute  phase of infection. The expected result is Negative. Fact Sheet for Patients:  StrictlyIdeas.no Fact Sheet for Healthcare Providers: BankingDealers.co.za This test is not yet approved or cleared by the [place] FDA and has been authorized for detection and/or diagnosis of SARS-CoV-2 by FDA under an Emergency Use Authorization (EUA).  This EUA will remain in effect (meaning this test can be used) for the duration of the COVID-19 declaration under Section 564(b)(1) of the Act, 21 U.S.C. section 360bbb-3(b)(1), unless the authorization is terminated or revoked sooner. Performed at [facility], [address], [place], [place] [address]      Scheduled Meds: . amLODipine  5 mg Oral Daily  . B-12   Oral QPM  . enoxaparin (LOVENOX) injection  40 mg Subcutaneous Q24H  . insulin aspart  0-5 Units Subcutaneous QHS  . insulin aspart  0-9 Units Subcutaneous TID WC  . insulin glargine  10 Units Subcutaneous Daily  . metoprolol succinate  50 mg Oral QHS  . mirabegron ER  50 mg Oral QPM  . nystatin   Topical BID  . simvastatin  20 mg Oral QPM  . Vitamin D3  5,000 Units Oral QPM   Continuous Infusions: . sodium chloride 100 mL/hr at 07/13/19 2319  . cefTRIAXone (ROCEPHIN)  IV    . dextrose 5 % and 0.45% NaCl Stopped (07/14/19 0534)    Procedures/Studies: Ct Head Wo  Contrast  Result Date: 07/13/2019 CLINICAL DATA:  85 year old female with hyperglycemia. Altered mental status. EXAM: CT HEAD WITHOUT CONTRAST TECHNIQUE: Contiguous axial images were obtained from the base of the skull through the vertex without intravenous contrast. COMPARISON:  Head CT dated 04/20/2019 FINDINGS: Brain: There is moderate age-related atrophy and chronic microvascular ischemic changes. There is no acute intracranial hemorrhage. No mass effect or midline shift. No extra-axial fluid collection. Vascular: No hyperdense vessel or unexpected calcification. Skull: Normal. Negative for fracture or focal lesion. Sinuses/Orbits: No acute finding. Other: None IMPRESSION: 1. No acute intracranial hemorrhage. 2. Moderate age-related atrophy and chronic microvascular ischemic changes. Electronically Signed   By: Anner Crete M.D.   On: 07/13/2019 23:23   Dg Chest Port 1 View  Result Date: 07/13/2019 CLINICAL DATA:  85 year old female with altered mental  status. EXAM: PORTABLE CHEST 1 VIEW COMPARISON:  Chest radiograph dated 03/24/2019 FINDINGS: Mild chronic interstitial coarsening. No focal consolidation, pleural effusion, or pneumothorax. Stable cardiac silhouette. There is apparent widening of the mediastinum, likely related to patient position and rotation. No acute osseous pathology. IMPRESSION: No active disease. Electronically Signed   By: Anner Crete M.D.   On: 07/13/2019 21:27    Orson Eva, DO  [facility] Pager [phone]  If 7PM-7AM, please contact night-coverage [email or web address] Password [email or web address] 07/14/2019, 8:51 AM   LOS: 0 days

## 2019-07-15 LAB — RPR: RPR Ser Ql: NONREACTIVE

## 2019-07-15 LAB — GLUCOSE, CAPILLARY
Glucose-Capillary: 189 mg/dL — ABNORMAL HIGH (ref 70–99)
Glucose-Capillary: 270 mg/dL — ABNORMAL HIGH (ref 70–99)
Glucose-Capillary: 222 mg/dL — ABNORMAL HIGH (ref 70–99)

## 2019-07-15 LAB — CBC
HCT: 39.6 % (ref 36.0–46.0)
Hemoglobin: 12.9 g/dL (ref 12.0–15.0)
MCH: 29.5 pg (ref 26.0–34.0)
MCHC: 32.6 g/dL (ref 30.0–36.0)
MCV: 90.6 fL (ref 80.0–100.0)
Platelets: 189 10*3/uL (ref 150–400)
RBC: 4.37 MIL/uL (ref 3.87–5.11)
RDW: 12.8 % (ref 11.5–15.5)
WBC: 7.8 10*3/uL (ref 4.0–10.5)
nRBC: 0 % (ref 0.0–0.2)

## 2019-07-15 LAB — BASIC METABOLIC PANEL
Anion gap: 11 (ref 5–15)
BUN: 11 mg/dL (ref 8–23)
CO2: 24 mmol/L (ref 22–32)
Calcium: 8.7 mg/dL — ABNORMAL LOW (ref 8.9–10.3)
Chloride: 105 mmol/L (ref 98–111)
Creatinine, Ser: 0.73 mg/dL (ref 0.44–1.00)
GFR calc Af Amer: >60 mL/min (ref >60)
GFR calc non Af Amer: >60 mL/min (ref >60)
Glucose, Bld: 206 mg/dL — ABNORMAL HIGH (ref 70–99)
Potassium: 3.2 mmol/L — ABNORMAL LOW (ref 3.5–5.1)
Sodium: 140 mmol/L (ref 135–145)

## 2019-07-15 LAB — MAGNESIUM: Magnesium: 1.4 mg/dL — ABNORMAL LOW (ref 1.7–2.4)

## 2019-07-15 MED ORDER — INSULIN GLARGINE 100 UNIT/ML ~~LOC~~ SOLN: 40.0000 [IU] | Freq: Every day | SUBCUTANEOUS | Status: DC

## 2019-07-15 MED ORDER — INSULIN GLARGINE 100 UNIT/ML ~~LOC~~ SOLN
14.0000 [IU] | Freq: Every day | SUBCUTANEOUS | Status: DC
  Administered 2019-07-15: 14 [IU] via SUBCUTANEOUS
  Filled 2019-07-15 (×2): qty 0.14

## 2019-07-15 MED ORDER — POTASSIUM CHLORIDE CRYS ER 20 MEQ PO TBCR
40.0000 meq | EXTENDED_RELEASE_TABLET | Freq: Once | ORAL | Status: AC
  Administered 2019-07-15: 40 meq via ORAL
  Filled 2019-07-15: qty 2

## 2019-07-15 MED ORDER — CEPHALEXIN 500 MG PO CAPS: 500.0000 mg | ORAL_CAPSULE | Freq: Three times a day (TID) | ORAL | 0 refills | Status: AC

## 2019-07-15 NOTE — Progress Notes (Signed)
Inpatient Diabetes Program Recommendations  AACE/ADA: New Consensus Statement on Inpatient Glycemic Control   Target Ranges:  Prepandial:   less than 140 mg/dL      Peak postprandial:   less than 180 mg/dL (1-2 hours)      Critically ill patients:  140 - 180 mg/dL   Results for [name], [name] (MRN [id number]) as of 07/15/2019 07:39  Ref. Range 07/14/2019 08:30 07/14/2019 11:28 07/14/2019 16:26 07/14/2019 22:21 07/15/2019 07:16  Glucose-Capillary Latest Ref Range: 70 - 99 mg/dL 199 (H) 369 (H) 321 (H) 253 (H) 189 (H)  Results for [name], [name] (MRN [id number]) as of 07/15/2019 07:39  Ref. Range 02/08/2019 04:19 07/14/2019 09:52  Hemoglobin A1C Latest Ref Range: 4.8 - 5.6 % 14.0 (H) 12.3 (H)   Review of Glycemic Control  Diabetes history: DM2 Outpatient Diabetes medications: Lantus 25 units BID Current orders for Inpatient glycemic control: Lantus 10 units daily, Novolog 0-9 units TID with meals, Novolog 0-5 units QHS  Inpatient Diabetes Program Recommendations:   Insulin - Basal: Please consider increasing Lantus to 14 units daily.  Insulin-Meal Coverage: Please consider ordering Novolog 3 units TID with meals for meal coverage if patient eats at least 50% of meals.  A1C: A1C 12.3% on 07/14/19 indicating an average glucose of 306 mg/dl. Patient with noted dementia.  Anticipate patient is not taking insulin consistently as an outpatient and will likely need assistance from family to ensure she is taking insulin.  Thanks, Barnie Alderman, RN, MSN, CDE Diabetes Coordinator Inpatient Diabetes Program [phone] (Team Pager from 8am to 5pm)

## 2019-07-15 NOTE — TOC Transition Note (Signed)
Transition of Care [facility]) - CM/SW Discharge Note   Patient Details  Name: [name] MRN: [id number] Date of Birth: February 22, 1929  [facility] CM/SW Contact:  Boneta Lucks, RN Phone Number: 07/15/2019, 12:00 PM   Clinical Narrative:   Patient admitted with altered mental status. MD states patient needs more family support and he will look at medication changes to help. Patient has Dementia and not taking medication properly. Patient lives at home with her husband. CM with Son, Doren Custard, he state his father is wore out with his own health issues, he has spoken with his sister and they agree they will have to give more support.  His mother has refused letting them help, but they will get involved. They had used [facility] in the past and he wishes to have a RN to help the family transition into helping more in the home.  CM spoke with patient, she does not feel she needs more help, but she agreed to [facility].  [facility] with [facility] to give referral and plan for discharge today.   Expected Discharge Plan: [place] Barriers to Discharge: No Barriers Identified   Patient Goals and CMS Choice     Choice offered to / list presented to : Patient  Expected Discharge Plan and Services Expected Discharge Plan: [place] Acute Care Choice: [place] arrangements for the past 2 months: Single Family Home                           HH Arranged: RN [place] Agency: [place] ([place]) Date [place]: 07/15/19 Time [place]: 1139 Representative spoke with at [place]: [place] Arrangements/Services Living arrangements for the past 2 months: [place] with:: Spouse Patient language and need for interpreter reviewed:: Yes Do you feel safe going back to the place where you live?: Yes      Need for Family Participation in Patient Care: Yes  (Comment) Care giver support system in place?: Yes (comment) Current home services: Home RN Criminal Activity/Legal Involvement Pertinent to Current Situation/Hospitalization: No - Comment as needed  Activities of Daily Living Home Assistive Devices/Equipment: Wheelchair ADL Screening (condition at time of admission) Patient's cognitive ability adequate to safely complete daily activities?: Yes Is the patient deaf or have difficulty hearing?: No Does the patient have difficulty seeing, even when wearing glasses/contacts?: No Does the patient have difficulty concentrating, remembering, or making decisions?: No Patient able to express need for assistance with ADLs?: Yes Does the patient have difficulty dressing or bathing?: Yes Independently performs ADLs?: Yes (appropriate for developmental age) Does the patient have difficulty walking or climbing stairs?: Yes Weakness of Legs: Both Weakness of Arms/Hands: None  Permission Sought/Granted Permission sought to share information with : Case Manager, Family Supports(Son- Field seismologist) Permission granted to share information with : Yes, Verbal Permission Granted  Share Information with NAME: [place] granted to share info w AGENCY: [place] granted to share info w Relationship: SOn     Emotional Assessment     Affect (typically observed): Accepting Orientation: : Oriented to Self, Oriented to Place Alcohol / Substance Use: Not Applicable Psych Involvement: No (comment)  Admission diagnosis:  Hyperglycemia [R73.9] Fever, unspecified fever cause [R50.9] Altered mental status, unspecified altered mental status type [R41.82] Altered mental status [R41.82] Patient Active Problem List  Diagnosis Date Noted  . DKA, type 2 ([place]) 07/14/2019  . Uncontrolled type 2 diabetes mellitus with hyperglycemia, with long-term current use of insulin ([place]) 07/14/2019  . Acute metabolic encephalopathy 85/27/7824  .  Cellulitis, leg 07/14/2019  . AMS (altered mental status) 07/14/2019  . Altered mental status 07/13/2019  . Acute encephalopathy 03/24/2019  . Hypotension 02/07/2019  . Slurred speech 11/17/2018  . Diabetic neuropathy ([place]) 11/17/2018  . AKI (acute kidney injury) ([place]) 11/17/2018  . Uncontrolled type 2 diabetes mellitus with hyperglycemia ([place]) 06/11/2018  . Hyperlipidemia 02/20/2016  . Essential hypertension, benign 10/10/2015  . Vitamin D deficiency 10/10/2015  . Class 1 obesity due to excess calories without serious comorbidity with body mass index (BMI) of 31.0 to 31.9 in adult 10/10/2015  . HIP PAIN 01/12/2009  . SPINAL STENOSIS 01/12/2009  . LOW BACK PAIN 01/12/2009  . SPONDYLOLYSIS 01/12/2009  . SHOULDER PAIN 10/04/2008  . Uncontrolled type 2 diabetes mellitus with complication, without long-term current use of insulin ([place]) 10/01/2008   PCP:  Sharilyn Sites, MD Pharmacy:   [place], [place] [address] Phone: [phone] Fax: [place], [place] [facility] [address] [place] [address] [place] [address] Phone: [phone] Fax: [phone]       Post Acute Care Choice: Home Health                HH Arranged: RN [facility] Agency: [place] ([facility]) Date [facility] Agency Contacted: 07/15/19 Time [place]: 5053 Representative spoke with at [place]

## 2019-07-15 NOTE — Discharge Summary (Signed)
Physician Discharge Summary  [name] IBB:[id number] DOB: [birth year] DOA: 07/13/2019  PCP: Sharilyn Sites, MD  Admit date: 07/13/2019 Discharge date: 07/15/2019  Time spent: 35 minutes  Recommendations for Outpatient Follow-up:  1. Repeat basic metabolic panel to follow electrolytes and renal function 2. Close follow-up with the patient CBGs/A1c with further adjustment to hypoglycemic regimen as needed.   Discharge Diagnoses:  Altered mental status due to acute metabolic encephalopathy in the setting of DKA. Essential hypertension, benign Uncontrolled type 2 diabetes mellitus with hyperglycemia (HCC) DKA, type 2 (HCC) Cellulitis, leg Hyperlipidemia Dementia  Discharge Condition: Stable and improved.  Discharged home with instruction to follow-up with PCP in 10 days.  Diet recommendation: Heart healthy and modified carbohydrate diet.  Filed Weights   07/13/19 1937 07/15/19 0527  Weight: 94.2 kg 93.4 kg    History of present illness:  As per H&P written by Dr. Jani Gravel on 07/13/2019 85 year old female with a history of tremor, dementia, hypertension, diabetes mellitus type 2, stroke presenting with altered mental status and elevated CBGs.  Unfortunately, the patient is unable to provide any significant history secondary to cognitive impairment.  This history is obtained from review the medical record and speaking with the patient spouse on the telephone.  Apparently, the patient manages her own medications, but her daughter lays out her medications out a week at a time for the patient to take.  The patient spouse does note that occasionally the patient misses her medications.  He stated that he does not manage, and he does not check vigilantly whether the patient takes her medications as indicated.  Nevertheless, he states that she has not had any fevers, chills, chest pain, shortness breath, headache, nausea, vomiting, diarrhea, domino pain.  He does note that she has had gait  instability with falling twice in the past 2 weeks.  He noted that the patient was increasingly confused and somnolent on 07/13/2019.  As result he checked her CBG and it was 552.  As result, EMS was activated.  Notably, the patient had an admission to the hospital from 03/24/2019 to 03/26/2019 secondary to metabolic encephalopathy secondary to polypharmacy and poor compliance with medications.  In the emergency department, the patient had temperature up to 100.8 F.  She was hemodynamically stable saturating 100% room air.  Chest x-ray was negative for any acute findings.  CT of the brain was negative.  WBC was 10.7.  BMP showed a serum glucose of 438 with anion gap of 12.  Urinalysis showed ketonuria.  Patient was started on IV fluids and IV insulin.  Hospital Course:  1-Uncontrolled diabetes with hyperglycemia and type II DKA on presentation -Appropriately treated with fluid resuscitation and IV insulin -Electrolytes were monitored and repleted appropriately -Once DKA resolved patient was transitioned to long-acting insulin and a sliding scale insulin -Patient discharged on daily Lantus and resumption of oral CTAB Glickstein -I5W 38.8 in February 2020 (demonstrating poor control) -We will orchestrate for home health nurses -Instructed to follow modified carbohydrate diet -Will need close follow-up of her diabetes with further adjustment to hypoglycemic regimen as needed.  2-left lower extremity cellulitis -Patient fell 2 weeks prior to this admission causing abrasion on her left pretibial area -No open wounds currently -No active drainage -Patient is afebrile, complete resolution of elevated WBCs -Treated with cefazolin initially and subsequently transitioned to Keflex orally to complete antibiotic therapy.  3-essential hypertension -Continue amlodipine and metoprolol -Heart healthy diet has been recommended. -Blood pressure stable at discharge.  4-hyperlipidemia -Continue  Zocor.  5-dementia: With acute metabolic encephalopathy in the setting of DKA -Mentation back to patient's baseline at discharge -Family will get more involved in patient's care -Home health nurse has been arranged.  6-hypokalemia -Magnesium within normal limits -Electrolytes repleted -Repeat Basic metabolic panel at follow-up visits.  Procedures:  See below for x-ray reports  Consultations:  None  Discharge Exam: Vitals:   07/15/19 0527 07/15/19 1446  BP: (!) 154/66 (!) 150/59  Pulse: 94 77  Resp: 20 16  Temp:  98.4 F (36.9 C)  SpO2: (!) 85% 97%    General: Afebrile, no chest pain, no nausea, no shortness of breath or orthopnea. Confused and demonstrating poor insight on examination; but was able to follow commands appropriately. Cardiovascular: S1 and S2, no rubs, no gallops, no murmurs. Respiratory: Good air movement bilaterally; normal respiratory effort.  No using accessory muscles Abdomen: Soft, nontender, nondistended, positive bowel sounds Extremities: Significant improvement in patient's erythematous changes, no drainage on exam, trace-1+ edema bilaterally.  Discharge Instructions   Discharge Instructions    Diet - low sodium heart healthy   Complete by: As directed    Diet Carb Modified   Complete by: As directed    Discharge instructions   Complete by: As directed    Take medications as prescribed Follow modified carbohydrate diet Complete antibiotics as instructed Arrange follow-up with PCP in 10 days Maintain adequate hydration.     Allergies as of 07/15/2019      Reactions   Codeine Nausea And Vomiting      Medication List    STOP taking these medications   atorvastatin 10 MG tablet Commonly known as: LIPITOR   Tradjenta 5 MG Tabs tablet Generic drug: linagliptin     TAKE these medications   amLODipine 5 MG tablet Commonly known as: NORVASC Take 1 tablet (5 mg total) by mouth daily for 30 days.   B-12 PO Take 1 tablet by mouth  every evening.   cephALEXin 500 MG capsule Commonly known as: KEFLEX Take 1 capsule (500 mg total) by mouth 3 (three) times daily for 7 days.   insulin glargine 100 UNIT/ML injection Commonly known as: LANTUS Inject 0.4 mLs (40 Units total) into the skin daily. What changed:   how much to take  when to take this   metoprolol succinate 50 MG 24 hr tablet Commonly known as: TOPROL-XL Take 50 mg by mouth at bedtime. Take with or immediately following a meal.   Myrbetriq 50 MG Tb24 tablet Generic drug: mirabegron ER Take 50 mg by mouth every evening.   nystatin powder Commonly known as: MYCOSTATIN/NYSTOP Apply topically 2 (two) times daily. Apply to affected skin for treatment of fungal infection. Please keep area clean and dry.   simvastatin 20 MG tablet Commonly known as: ZOCOR Take 20 mg by mouth every evening.   sitaGLIPtin 50 MG tablet Commonly known as: JANUVIA Take 50 mg by mouth daily.   Vitamin D3 125 MCG (5000 UT) Caps Take 1 capsule (5,000 Units total) by mouth daily. What changed: when to take this      Allergies  Allergen Reactions  . Codeine Nausea And Vomiting   Follow-up Information    Sharilyn Sites, MD. Schedule an appointment as soon as possible for a visit in 10 day(s).   Specialty: Family Medicine Contact information: [address] [place] [address] [phone]           The results of significant diagnostics from this hospitalization (including imaging, microbiology, ancillary and  laboratory) are listed below for reference.    Significant Diagnostic Studies: Ct Head Wo Contrast  Result Date: 07/13/2019 CLINICAL DATA:  85 year old female with hyperglycemia. Altered mental status. EXAM: CT HEAD WITHOUT CONTRAST TECHNIQUE: Contiguous axial images were obtained from the base of the skull through the vertex without intravenous contrast. COMPARISON:  Head CT dated 04/20/2019 FINDINGS: Brain: There is moderate age-related atrophy  and chronic microvascular ischemic changes. There is no acute intracranial hemorrhage. No mass effect or midline shift. No extra-axial fluid collection. Vascular: No hyperdense vessel or unexpected calcification. Skull: Normal. Negative for fracture or focal lesion. Sinuses/Orbits: No acute finding. Other: None IMPRESSION: 1. No acute intracranial hemorrhage. 2. Moderate age-related atrophy and chronic microvascular ischemic changes. Electronically Signed   By: Anner Crete M.D.   On: 07/13/2019 23:23   Dg Chest Port 1 View  Result Date: 07/13/2019 CLINICAL DATA:  85 year old female with altered mental status. EXAM: PORTABLE CHEST 1 VIEW COMPARISON:  Chest radiograph dated 03/24/2019 FINDINGS: Mild chronic interstitial coarsening. No focal consolidation, pleural effusion, or pneumothorax. Stable cardiac silhouette. There is apparent widening of the mediastinum, likely related to patient position and rotation. No acute osseous pathology. IMPRESSION: No active disease. Electronically Signed   By: Anner Crete M.D.   On: 07/13/2019 21:27    Microbiology: Recent Results (from the past 240 hour(s))  SARS Coronavirus 2 (CEPHEID- Performed in [facility] hospital lab), Hosp Order     Status: None   Collection Time: 07/13/19  9:31 PM   Specimen: Nasopharyngeal Swab  Result Value Ref Range Status   SARS Coronavirus 2 NEGATIVE NEGATIVE Final    Comment: (NOTE) If result is NEGATIVE SARS-CoV-2 target nucleic acids are NOT DETECTED. The SARS-CoV-2 RNA is generally detectable in upper and lower  respiratory specimens during the acute phase of infection. The lowest  concentration of SARS-CoV-2 viral copies this assay can detect is 250  copies / mL. A negative result does not preclude SARS-CoV-2 infection  and should not be used as the sole basis for treatment or other  patient management decisions.  A negative result may occur with  improper specimen collection / handling, submission of specimen  other  than nasopharyngeal swab, presence of viral mutation(s) within the  areas targeted by this assay, and inadequate number of viral copies  (<250 copies / mL). A negative result must be combined with clinical  observations, patient history, and epidemiological information. If result is POSITIVE SARS-CoV-2 target nucleic acids are DETECTED. The SARS-CoV-2 RNA is generally detectable in upper and lower  respiratory specimens dur ing the acute phase of infection.  Positive  results are indicative of active infection with SARS-CoV-2.  Clinical  correlation with patient history and other diagnostic information is  necessary to determine patient infection status.  Positive results do  not rule out bacterial infection or co-infection with other viruses. If result is PRESUMPTIVE POSTIVE SARS-CoV-2 nucleic acids MAY BE PRESENT.   A presumptive positive result was obtained on the submitted specimen  and confirmed on repeat testing.  While 2019 novel coronavirus  (SARS-CoV-2) nucleic acids may be present in the submitted sample  additional confirmatory testing may be necessary for epidemiological  and / or clinical management purposes  to differentiate between  SARS-CoV-2 and other Sarbecovirus currently known to infect humans.  If clinically indicated additional testing with an alternate test  methodology [phone]) is advised. The SARS-CoV-2 RNA is generally  detectable in upper and lower respiratory sp ecimens during the acute  phase  of infection. The expected result is Negative. Fact Sheet for Patients:  StrictlyIdeas.no Fact Sheet for Healthcare Providers: BankingDealers.co.za This test is not yet approved or cleared by the [place] FDA and has been authorized for detection and/or diagnosis of SARS-CoV-2 by FDA under an Emergency Use Authorization (EUA).  This EUA will remain in effect (meaning this test can be used) for the duration  of the COVID-19 declaration under Section 564(b)(1) of the Act, 21 U.S.C. section 360bbb-3(b)(1), unless the authorization is terminated or revoked sooner. Performed at [facility], [address] [place], [place] [address]   Culture, blood (Routine X 2) w Reflex to ID Panel     Status: None (Preliminary result)   Collection Time: 07/14/19  9:52 AM   Specimen: BLOOD LEFT FOREARM  Result Value Ref Range Status   Specimen Description BLOOD LEFT FOREARM  Final   Special Requests   Final    BOTTLES DRAWN AEROBIC AND ANAEROBIC Blood Culture adequate volume   Culture   Final    NO GROWTH < 24 HOURS Performed at [facility], [address], [place], [place] [address]    Report Status PENDING  Incomplete  Culture, blood (Routine X 2) w Reflex to ID Panel     Status: None (Preliminary result)   Collection Time: 07/14/19  9:52 AM   Specimen: Left Antecubital; Blood  Result Value Ref Range Status   Specimen Description LEFT ANTECUBITAL  Final   Special Requests   Final    BOTTLES DRAWN AEROBIC AND ANAEROBIC Blood Culture adequate volume   Culture   Final    NO GROWTH < 24 HOURS Performed at [facility], [address], [place], [place] [address]    Report Status PENDING  Incomplete     Labs: Basic Metabolic Panel: Recent Labs  Lab 07/13/19 2035 07/14/19 0058 07/14/19 0648 07/15/19 0621  NA 132* 135 140 140  K 5.2* 3.5 3.2* 3.2*  CL 99 101 105 105  CO2 21* 25 25 24   GLUCOSE 438* 331* 144* 206*  BUN 12 11 11 11   CREATININE 0.80 0.81 0.78 0.73  CALCIUM 9.3 9.1 9.0 8.7*  MG  --   --   --  1.4*   Liver Function Tests: Recent Labs  Lab 07/14/19 0648  AST 16  ALT 15  ALKPHOS 89  BILITOT 1.0  PROT 6.0*  ALBUMIN 3.1*    Recent Labs  Lab 07/14/19 0058  AMMONIA 18   CBC: Recent Labs  Lab 07/13/19 2035 07/14/19 0648 07/15/19 0621  WBC 10.7* 10.0 7.8  HGB 14.2 13.4 12.9  HCT 40.0 39.6 39.6  MCV 83.5 88.2 90.6  PLT 169 251 189    CBG: Recent Labs  Lab  07/14/19 1626 07/14/19 2221 07/15/19 0716 07/15/19 1126 07/15/19 1611  GLUCAP 321* 253* 189* 270* 222*    Signed:  Barton Dubois MD.  [facility] 07/15/2019, 4:42 PM

## 2019-07-15 NOTE — Plan of Care (Signed)

## 2019-07-15 NOTE — Care Management Important Message (Signed)
Important Message  Patient Details  Name: ALETTE KATAOKA MRN: [id number] Date of Birth: 1929/08/14   Medicare Important Message Given:  Yes     Tommy Medal 07/15/2019, 1:43 PM

## 2019-07-16 DIAGNOSIS — S81802D Unspecified open wound, left lower leg, subsequent encounter: Secondary | ICD-10-CM | POA: Diagnosis not present

## 2019-07-16 DIAGNOSIS — E1310 Other specified diabetes mellitus with ketoacidosis without coma: Secondary | ICD-10-CM | POA: Diagnosis not present

## 2019-07-16 DIAGNOSIS — I10 Essential (primary) hypertension: Secondary | ICD-10-CM | POA: Diagnosis not present

## 2019-07-16 DIAGNOSIS — E1165 Type 2 diabetes mellitus with hyperglycemia: Secondary | ICD-10-CM | POA: Diagnosis not present

## 2019-07-16 DIAGNOSIS — F0390 Unspecified dementia without behavioral disturbance: Secondary | ICD-10-CM | POA: Diagnosis not present

## 2019-07-16 DIAGNOSIS — Z9181 History of falling: Secondary | ICD-10-CM | POA: Diagnosis not present

## 2019-07-16 DIAGNOSIS — E1142 Type 2 diabetes mellitus with diabetic polyneuropathy: Secondary | ICD-10-CM | POA: Diagnosis not present

## 2019-07-16 DIAGNOSIS — Z4800 Encounter for change or removal of nonsurgical wound dressing: Secondary | ICD-10-CM | POA: Diagnosis not present

## 2019-07-16 DIAGNOSIS — L03116 Cellulitis of left lower limb: Secondary | ICD-10-CM | POA: Diagnosis not present

## 2019-07-16 DIAGNOSIS — Z8673 Personal history of transient ischemic attack (TIA), and cerebral infarction without residual deficits: Secondary | ICD-10-CM | POA: Diagnosis not present

## 2019-07-17 DIAGNOSIS — Z1389 Encounter for screening for other disorder: Secondary | ICD-10-CM | POA: Diagnosis not present

## 2019-07-17 DIAGNOSIS — I10 Essential (primary) hypertension: Secondary | ICD-10-CM | POA: Diagnosis not present

## 2019-07-17 DIAGNOSIS — E7849 Other hyperlipidemia: Secondary | ICD-10-CM | POA: Diagnosis not present

## 2019-07-17 DIAGNOSIS — E1165 Type 2 diabetes mellitus with hyperglycemia: Secondary | ICD-10-CM | POA: Diagnosis not present

## 2019-07-19 LAB — CULTURE, BLOOD (ROUTINE X 2)
Culture: NO GROWTH
Culture: NO GROWTH
Special Requests: ADEQUATE
Special Requests: ADEQUATE

## 2019-07-21 DIAGNOSIS — Z6834 Body mass index (BMI) 34.0-34.9, adult: Secondary | ICD-10-CM | POA: Diagnosis not present

## 2019-07-21 DIAGNOSIS — E6609 Other obesity due to excess calories: Secondary | ICD-10-CM | POA: Diagnosis not present

## 2019-07-21 DIAGNOSIS — E1165 Type 2 diabetes mellitus with hyperglycemia: Secondary | ICD-10-CM | POA: Diagnosis not present

## 2019-07-23 DIAGNOSIS — E1142 Type 2 diabetes mellitus with diabetic polyneuropathy: Secondary | ICD-10-CM | POA: Diagnosis not present

## 2019-07-23 DIAGNOSIS — E1165 Type 2 diabetes mellitus with hyperglycemia: Secondary | ICD-10-CM | POA: Diagnosis not present

## 2019-07-23 DIAGNOSIS — E1310 Other specified diabetes mellitus with ketoacidosis without coma: Secondary | ICD-10-CM | POA: Diagnosis not present

## 2019-07-23 DIAGNOSIS — I10 Essential (primary) hypertension: Secondary | ICD-10-CM | POA: Diagnosis not present

## 2019-07-23 DIAGNOSIS — L03116 Cellulitis of left lower limb: Secondary | ICD-10-CM | POA: Diagnosis not present

## 2019-07-23 DIAGNOSIS — F0390 Unspecified dementia without behavioral disturbance: Secondary | ICD-10-CM | POA: Diagnosis not present

## 2019-07-24 DIAGNOSIS — F0390 Unspecified dementia without behavioral disturbance: Secondary | ICD-10-CM | POA: Diagnosis not present

## 2019-07-24 DIAGNOSIS — E1165 Type 2 diabetes mellitus with hyperglycemia: Secondary | ICD-10-CM | POA: Diagnosis not present

## 2019-07-24 DIAGNOSIS — E1310 Other specified diabetes mellitus with ketoacidosis without coma: Secondary | ICD-10-CM | POA: Diagnosis not present

## 2019-07-24 DIAGNOSIS — L03116 Cellulitis of left lower limb: Secondary | ICD-10-CM | POA: Diagnosis not present

## 2019-07-24 DIAGNOSIS — I10 Essential (primary) hypertension: Secondary | ICD-10-CM | POA: Diagnosis not present

## 2019-07-24 DIAGNOSIS — E1142 Type 2 diabetes mellitus with diabetic polyneuropathy: Secondary | ICD-10-CM | POA: Diagnosis not present

## 2019-07-24 NOTE — Progress Notes (Signed)
MOCA BLIND TEST PERFORMED ON 05/11/19 BY DANA CHAMERLAIN  CHARTED TODAY

## 2019-07-27 ENCOUNTER — Ambulatory Visit (INDEPENDENT_AMBULATORY_CARE_PROVIDER_SITE_OTHER): Payer: Medicare Other | Admitting: "Endocrinology

## 2019-07-27 ENCOUNTER — Other Ambulatory Visit: Payer: Self-pay

## 2019-07-27 ENCOUNTER — Encounter: Payer: Self-pay | Admitting: "Endocrinology

## 2019-07-27 VITALS — BP 136/74 | HR 60 | Temp 97.3°F | Ht 69.0 in | Wt 206.0 lb

## 2019-07-27 DIAGNOSIS — E782 Mixed hyperlipidemia: Secondary | ICD-10-CM | POA: Diagnosis not present

## 2019-07-27 DIAGNOSIS — E1165 Type 2 diabetes mellitus with hyperglycemia: Secondary | ICD-10-CM | POA: Diagnosis not present

## 2019-07-27 DIAGNOSIS — I10 Essential (primary) hypertension: Secondary | ICD-10-CM | POA: Diagnosis not present

## 2019-07-27 MED ORDER — GLIPIZIDE ER 5 MG PO TB24: 5.0000 mg | ORAL_TABLET | Freq: Every day | ORAL | 3 refills | Status: DC

## 2019-07-27 MED ORDER — INSULIN GLARGINE 100 UNIT/ML ~~LOC~~ SOLN: 60.0000 [IU] | Freq: Every day | SUBCUTANEOUS | 2 refills | Status: DC

## 2019-07-27 NOTE — Progress Notes (Signed)
07/27/2019  Endocrinology follow-up note  Subjective:    Patient ID: [name], female    DOB: [birth year],    Past Medical History:  Diagnosis Date  . Diabetes mellitus without complication ([place])   . Diabetic neuropathy ([place])   . Hypertension   . Stroke ([place])   . Urinary incontinence    Past Surgical History:  Procedure Laterality Date  . ABDOMINAL HYSTERECTOMY    . APPENDECTOMY    . BREAST SURGERY     begnin tumor removed  . CATARACT EXTRACTION, BILATERAL     Social History   Socioeconomic History  . Marital status: Married    Spouse name: Beverlyann Broxterman  . Number of children: 2  . Years of education: Not on file  . Highest education level: Not on file  Occupational History  . Occupation: retired    Comment: [employer]  . Financial resource strain: Not on file  . Food insecurity    Worry: Never true    Inability: Never true  . Transportation needs    Medical: No    Non-medical: No  Tobacco Use  . Smoking status: Never Smoker  . Smokeless tobacco: Never Used  Substance and Sexual Activity  . Alcohol use: No  . Drug use: No  . Sexual activity: Not on file  Lifestyle  . Physical activity    Days per week: 0 days    Minutes per session: 0 min  . Stress: Not at all  Relationships  . Social connections    Talks on phone: More than three times a week    Gets together: More than three times a week    Attends religious service: More than 4 times per year    Active member of club or organization: Not on file    Attends meetings of clubs or organizations: Not on file    Relationship status: Married  Other Topics Concern  . Not on file  Social History Narrative  . Not on file   Outpatient Encounter Medications as of 07/27/2019  Medication Sig  . amLODipine (NORVASC) 5 MG tablet Take 1 tablet (5 mg total) by mouth daily for 30 days.  . Cholecalciferol (VITAMIN D3) 5000 units CAPS Take 1 capsule (5,000 Units total) by mouth  daily. (Patient taking differently: Take 5,000 Units by mouth every evening. )  . Cyanocobalamin (B-12 PO) Take 1 tablet by mouth every evening.  [name] glipiZIDE (GLUCOTROL XL) 5 MG 24 hr tablet Take 1 tablet (5 mg total) by mouth daily with breakfast.  . insulin glargine (LANTUS) 100 UNIT/ML injection Inject 0.6 mLs (60 Units total) into the skin daily.  . metoprolol succinate (TOPROL-XL) 50 MG 24 hr tablet Take 50 mg by mouth at bedtime. Take with or immediately following a meal.   . MYRBETRIQ 50 MG TB24 tablet Take 50 mg by mouth every evening.   . nystatin (MYCOSTATIN/NYSTOP) powder Apply topically 2 (two) times daily. Apply to affected skin for treatment of fungal infection. Please keep area clean and dry.  . simvastatin (ZOCOR) 20 MG tablet Take 20 mg by mouth every evening.  . sitaGLIPtin (JANUVIA) 50 MG tablet Take 50 mg by mouth daily.  . [DISCONTINUED] insulin glargine (LANTUS) 100 UNIT/ML injection Inject 0.4 mLs (40 Units total) into the skin daily.   No facility-administered encounter medications on file as of 07/27/2019.    ALLERGIES: Allergies  Allergen Reactions  . Codeine Nausea And Vomiting   VACCINATION STATUS: There is  no immunization history for the selected administration types on file for this patient.  Diabetes She presents for her follow-up diabetic visit. She has type 2 diabetes mellitus. Onset time: She was diagnosed at approximate age of 69 years. Her disease course has been worsening. There are no hypoglycemic associated symptoms. Pertinent negatives for hypoglycemia include no confusion, headaches, pallor or seizures. Associated symptoms include polydipsia and polyuria. Pertinent negatives for diabetes include no chest pain, no fatigue and no polyphagia. There are no hypoglycemic complications. Symptoms are worsening. There are no diabetic complications. Risk factors for coronary artery disease include dyslipidemia, diabetes mellitus and hypertension. Current diabetic  treatment includes oral agent (dual therapy). Her weight is fluctuating minimally. She is following a generally unhealthy diet. She has had a previous visit with a dietitian. Her home blood glucose trend is increasing steadily. Her breakfast blood glucose range is generally >200 mg/dl. Her overall blood glucose range is >200 mg/dl. An ACE inhibitor/angiotensin II receptor blocker is being taken.  Hyperlipidemia This is a chronic problem. The current episode started more than 1 year ago. Exacerbating diseases include diabetes and obesity. Pertinent negatives include no chest pain, myalgias or shortness of breath. Current antihyperlipidemic treatment includes statins. Risk factors for coronary artery disease include diabetes mellitus, dyslipidemia, hypertension, a sedentary lifestyle, post-menopausal and obesity.  Hypertension This is a chronic problem. The current episode started more than 1 year ago. The problem is controlled. Pertinent negatives include no chest pain, headaches, palpitations or shortness of breath. Risk factors for coronary artery disease include dyslipidemia, diabetes mellitus, family history, obesity, sedentary lifestyle and post-menopausal state. Past treatments include ACE inhibitors. The current treatment provides moderate improvement.    Review of Systems  Constitutional: Negative for fatigue and unexpected weight change.  HENT: Negative for trouble swallowing and voice change.   Eyes: Negative for visual disturbance.  Respiratory: Negative for cough, shortness of breath and wheezing.   Cardiovascular: Negative for chest pain, palpitations and leg swelling.  Gastrointestinal: Negative for diarrhea, nausea and vomiting.  Endocrine: Positive for polydipsia and polyuria. Negative for cold intolerance, heat intolerance and polyphagia.  Musculoskeletal: Positive for gait problem. Negative for arthralgias and myalgias.  Skin: Negative for color change, pallor, rash and wound.   Neurological: Negative for seizures and headaches.  Psychiatric/Behavioral: Negative for confusion and suicidal ideas.    Objective:    BP 136/74 (BP Location: Left Arm, Patient Position: Sitting, Cuff Size: Normal)   Pulse 60   Temp (!) 97.3 F (36.3 C) (Oral)   Ht 5\' 9"  (1.753 m)   Wt 206 lb (93.4 kg)   SpO2 97%   BMI 30.42 kg/m   Wt Readings from Last 3 Encounters:  07/27/19 206 lb (93.4 kg)  07/15/19 205 lb 14.6 oz (93.4 kg)  06/01/19 200 lb (90.7 kg)    Physical Exam  Constitutional: She is oriented to person, place, and time. She appears well-developed.  Walks with a cane.  HENT:  Head: Normocephalic and atraumatic.  Eyes: EOM are normal.  Neck: Normal range of motion. Neck supple. No tracheal deviation present. No thyromegaly present.  Cardiovascular: Normal rate.  Pulmonary/Chest: Effort normal.  Abdominal: Bowel sounds are normal. There is no abdominal tenderness. There is no guarding.  Musculoskeletal:        General: No edema.     Comments: Walks with a cane.  Neurological: She is alert and oriented to person, place, and time. She has normal reflexes. No cranial nerve deficit. Coordination normal.  Skin: Skin  is warm and dry. No rash noted. No erythema. No pallor.  Psychiatric: She has a normal mood and affect. Judgment normal.    Diabetic Labs (most recent): Lab Results  Component Value Date   HGBA1C 12.3 (H) 07/14/2019   HGBA1C 14.0 (H) 02/08/2019   HGBA1C 9.0 (H) 11/17/2018   CMP     Component Value Date/Time   NA 140 07/15/2019 0621   NA 142 09/25/2018 1126   K 3.2 (L) 07/15/2019 0621   CL 105 07/15/2019 0621   CO2 24 07/15/2019 0621   GLUCOSE 206 (H) 07/15/2019 0621   BUN 11 07/15/2019 0621   BUN 24 09/25/2018 1126   CREATININE 0.73 07/15/2019 0621   CALCIUM 8.7 (L) 07/15/2019 0621   PROT 6.0 (L) 07/14/2019 0648   PROT 6.2 09/25/2018 1126   ALBUMIN 3.1 (L) 07/14/2019 0648   ALBUMIN 3.7 09/25/2018 1126   AST 16 07/14/2019 0648   ALT 15  07/14/2019 0648   ALKPHOS 89 07/14/2019 0648   BILITOT 1.0 07/14/2019 0648   BILITOT 0.6 09/25/2018 1126   GFRNONAA >60 07/15/2019 0621   GFRAA >60 07/15/2019 0621     Assessment & Plan:   1. Uncontrolled type 2 diabetes mellitus with CKD as a complication, without long-term current use of insulin ([place])  -She returns with significant loss of control in her glycemia with average blood glucose greater than 30 mg per DL, and A1c of 12.3% increasing from 8.8%.   She missed at least one appointment since her last visit. -She has no reported nor documented hypoglycemia.    - Patient remains at a high risk for more acute and chronic complications of diabetes which include CAD, CVA, CKD, retinopathy, and neuropathy. These are all discussed in detail with the patient.  - I have re-counseled the patient on diet management and weight loss  by adopting a carbohydrate restricted / protein rich  Diet. - Patient is advised to stick to a routine mealtimes to eat 3 meals  a day and avoid unnecessary snacks ( to snack only to correct hypoglycemia).  - she  admits there is a room for improvement in her diet and drink choices. -  Suggestion is made for her to avoid simple carbohydrates  from her diet including Cakes, Sweet Desserts / Pastries, Ice Cream, Soda (diet and regular), Sweet Tea, Candies, Chips, Cookies, Sweet Pastries,  Store Bought Juices, Alcohol in Excess of  1-2 drinks a day, Artificial Sweeteners, Coffee Creamer, and "Sugar-free" Products. This will help patient to have stable blood glucose profile and potentially avoid unintended weight gain.   - I have approached patient with the following individualized plan to manage diabetes and patient agrees.   -Due to her presentation with significantly above target glycemic profile, she may require intensive treatment with basal/bolus insulin in order for her to achieve reasonable control of glycemia towards target.  -She is advised to continue  Januvia 50 mg p.o. daily at breakfast, I discussed and added glipizide 5 mg XL p.o. daily at breakfast as well.  -Patient is encouraged to call clinic for blood glucose levels less than 70 or above 300 mg /dlx3.  - Patient specific target  for A1c; LDL, HDL, Triglycerides, and  Waist Circumference were discussed in detail.  2) BP/HTN: Her blood pressure is controlled to target.  She is advised to continue her current blood pressure medications including lisinopril 20 mg p.o. twice daily.  3) Lipids/HPL: Her LDL is controlled at 48, has been taking simvastatin  20 mg p.o. daily for several years.   4)  Weight/Diet: CDE consult in progress, exercise, and carbohydrates information provided.  5)  Vitamin D deficiency - She is  s/p 12 weeks of  vitamin D therapy, 50,000 units weekly x 12 weeks.  5) Chronic Care/Health Maintenance:  -Patien is on ACEI and Statin medications and encouraged to continue to follow up with Ophthalmology, Podiatrist at least yearly or according to recommendations, and advised to stay away from smoking. I have recommended yearly flu vaccine and pneumonia vaccination at least every 5 years; and  sleep for at least 7 hours a day.  I advised patient to maintain close follow up with her PCP for primary care needs.   - Patient Care Time Today:  25 min, of which >50% was spent in  counseling and the rest reviewing her  current and  previous labs/studies, previous treatments, her blood glucose readings, and medications' doses and developing a plan for long-term care based on the latest recommendations for standards of care.   [name] participated in the discussions, expressed understanding, and voiced agreement with the above plans.  All questions were answered to her satisfaction. she is encouraged to contact clinic should she have any questions or concerns prior to her return visit.  Follow up plan: Return in about 2 weeks (around 08/10/2019) for Follow up with  Meter and Logs Only - no Labs.  Glade Lloyd, MD Phone: [phone]  Fax: [phone]  -  This note was partially dictated with voice recognition software. Similar sounding words can be transcribed inadequately or may not  be corrected upon review.  07/27/2019, 4:27 PM

## 2019-07-28 ENCOUNTER — Telehealth: Payer: Self-pay | Admitting: "Endocrinology

## 2019-07-28 DIAGNOSIS — E1310 Other specified diabetes mellitus with ketoacidosis without coma: Secondary | ICD-10-CM | POA: Diagnosis not present

## 2019-07-28 DIAGNOSIS — L03116 Cellulitis of left lower limb: Secondary | ICD-10-CM | POA: Diagnosis not present

## 2019-07-28 DIAGNOSIS — E1165 Type 2 diabetes mellitus with hyperglycemia: Secondary | ICD-10-CM | POA: Diagnosis not present

## 2019-07-28 DIAGNOSIS — E1142 Type 2 diabetes mellitus with diabetic polyneuropathy: Secondary | ICD-10-CM | POA: Diagnosis not present

## 2019-07-28 DIAGNOSIS — I10 Essential (primary) hypertension: Secondary | ICD-10-CM | POA: Diagnosis not present

## 2019-07-28 DIAGNOSIS — F0390 Unspecified dementia without behavioral disturbance: Secondary | ICD-10-CM | POA: Diagnosis not present

## 2019-07-28 NOTE — Telephone Encounter (Signed)
Routing to Dr Dorris Fetch for Advice?

## 2019-07-28 NOTE — Telephone Encounter (Signed)
Spoke with Janett Billow and increased Kelsay's lantus to 70 units qhs.

## 2019-07-28 NOTE — Telephone Encounter (Signed)
Noted  

## 2019-07-28 NOTE — Telephone Encounter (Signed)
[name] CALLED AND SAID AT 9:18AM THIS MORNING HER SUGAR WAS 527 AND SHE RE CHECKED HER SUGAR AT 2PM AND IT WAS 461. SHE WOULD LIKE YOU TO CALL HER. [phone]

## 2019-07-29 DIAGNOSIS — Z6834 Body mass index (BMI) 34.0-34.9, adult: Secondary | ICD-10-CM | POA: Diagnosis not present

## 2019-07-29 DIAGNOSIS — E1022 Type 1 diabetes mellitus with diabetic chronic kidney disease: Secondary | ICD-10-CM | POA: Diagnosis not present

## 2019-07-30 DIAGNOSIS — N311 Reflex neuropathic bladder, not elsewhere classified: Secondary | ICD-10-CM | POA: Diagnosis not present

## 2019-08-04 DIAGNOSIS — F0390 Unspecified dementia without behavioral disturbance: Secondary | ICD-10-CM | POA: Diagnosis not present

## 2019-08-04 DIAGNOSIS — E1310 Other specified diabetes mellitus with ketoacidosis without coma: Secondary | ICD-10-CM | POA: Diagnosis not present

## 2019-08-04 DIAGNOSIS — L03116 Cellulitis of left lower limb: Secondary | ICD-10-CM | POA: Diagnosis not present

## 2019-08-04 DIAGNOSIS — I10 Essential (primary) hypertension: Secondary | ICD-10-CM | POA: Diagnosis not present

## 2019-08-04 DIAGNOSIS — E1142 Type 2 diabetes mellitus with diabetic polyneuropathy: Secondary | ICD-10-CM | POA: Diagnosis not present

## 2019-08-04 DIAGNOSIS — E1165 Type 2 diabetes mellitus with hyperglycemia: Secondary | ICD-10-CM | POA: Diagnosis not present

## 2019-08-04 NOTE — Telephone Encounter (Signed)
Pt.notified

## 2019-08-04 NOTE — Telephone Encounter (Signed)
[name] with [facility] called and said she is with the patient and her readings are still elevated.    Today it was 345 @ 4:30am Aug 10 462 @ 4:42pm Aug 10 429 @ 7:58pm Aug 10 272 @ 4:14am Aug 9 451 @ 6:31pm Aug 9 325 @ 5:09am Aug 8 499 @ 12:34pm Aug 7 491 @ 6:17pm Aug 7 271 @ 4:42am Aug 6 488 @ 5:41pm Aug 6 533 @ 2:11pm Aug 6 266 @ 4:55am Aug 5 362 @ 6:41pm Aug 5 313 @ 3:01pm Aug 5 321 @ 5:41am  Dr Hilma Favors increased her insulin last Wednesday to 74 units. You can reach [name] @ [phone]

## 2019-08-04 NOTE — Telephone Encounter (Signed)
She can further increase Lantus to 80 units, keep apt.

## 2019-08-10 ENCOUNTER — Telehealth: Payer: Self-pay

## 2019-08-10 DIAGNOSIS — L03116 Cellulitis of left lower limb: Secondary | ICD-10-CM | POA: Diagnosis not present

## 2019-08-10 DIAGNOSIS — E1165 Type 2 diabetes mellitus with hyperglycemia: Secondary | ICD-10-CM | POA: Diagnosis not present

## 2019-08-10 DIAGNOSIS — E1310 Other specified diabetes mellitus with ketoacidosis without coma: Secondary | ICD-10-CM | POA: Diagnosis not present

## 2019-08-10 DIAGNOSIS — E1142 Type 2 diabetes mellitus with diabetic polyneuropathy: Secondary | ICD-10-CM | POA: Diagnosis not present

## 2019-08-10 DIAGNOSIS — I10 Essential (primary) hypertension: Secondary | ICD-10-CM | POA: Diagnosis not present

## 2019-08-10 DIAGNOSIS — F0390 Unspecified dementia without behavioral disturbance: Secondary | ICD-10-CM | POA: Diagnosis not present

## 2019-08-10 NOTE — Telephone Encounter (Signed)
Spoke with pt. She states that her aide is going to call us. She could not give [place] anymore information than that. Did not have phone # available for aide.

## 2019-08-10 NOTE — Telephone Encounter (Signed)
Please call regarding medication

## 2019-08-11 ENCOUNTER — Telehealth: Payer: Self-pay | Admitting: "Endocrinology

## 2019-08-11 NOTE — Telephone Encounter (Signed)
Pt aide states she has had high BG readings.   Date Before breakfast Before lunch Before supper Bedtime  8/15 341     8/16 317     8/17 464     8/18 271       Pt taking:  Lantus 80 units, glipizide XL 5mg  qd, januvia 50mg , (MTF 500mg  at suppertime which was prescribed by Dr Hilma Favors)   [phone]

## 2019-08-11 NOTE — Telephone Encounter (Signed)
[name] called and left a VM that the patient just got her Metformin in the mail and will start taking it tonight. She said that her sugar is 271 and that is probably why. If you need to call her you can @ [phone]

## 2019-08-11 NOTE — Telephone Encounter (Signed)
Note sent to dr Dorris Fetch

## 2019-08-12 NOTE — Telephone Encounter (Signed)
She can take glipizide 2 times a day, with breakfast and supper. She needs to monitor more, ac and hs and call back if still >200 x 3.

## 2019-08-12 NOTE — Telephone Encounter (Signed)
Pts aide notified. Pt also has a phone visit with Dr Dorris Fetch tomorrow. Any instructions will be faxed to [place] at [phone]

## 2019-08-13 ENCOUNTER — Ambulatory Visit (INDEPENDENT_AMBULATORY_CARE_PROVIDER_SITE_OTHER): Payer: Medicare Other | Admitting: "Endocrinology

## 2019-08-13 ENCOUNTER — Other Ambulatory Visit: Payer: Self-pay

## 2019-08-13 ENCOUNTER — Encounter: Payer: Self-pay | Admitting: "Endocrinology

## 2019-08-13 DIAGNOSIS — E782 Mixed hyperlipidemia: Secondary | ICD-10-CM | POA: Diagnosis not present

## 2019-08-13 DIAGNOSIS — E1165 Type 2 diabetes mellitus with hyperglycemia: Secondary | ICD-10-CM

## 2019-08-13 DIAGNOSIS — I10 Essential (primary) hypertension: Secondary | ICD-10-CM

## 2019-08-13 MED ORDER — GLIPIZIDE ER 5 MG PO TB24: 5.0000 mg | ORAL_TABLET | Freq: Two times a day (BID) | ORAL | 3 refills | Status: DC

## 2019-08-13 MED ORDER — MAGNESIUM 200 MG PO TABS: 200.0000 mg | ORAL_TABLET | Freq: Every day | ORAL | 0 refills | Status: DC

## 2019-08-13 MED ORDER — LANTUS SOLOSTAR 100 UNIT/ML ~~LOC~~ SOPN: 60.0000 [IU] | PEN_INJECTOR | Freq: Every day | SUBCUTANEOUS | 2 refills | Status: DC

## 2019-08-13 NOTE — Progress Notes (Signed)
08/13/2019                                                     Endocrinology Telehealth Visit Follow up Note -During COVID -19 Pandemic  This visit type was conducted due to national recommendations for restrictions regarding the COVID-19 Pandemic  in an effort to limit this patient's exposure and mitigate transmission of the corona virus.  Due to her co-morbid illnesses, [name] is at  moderate to high risk for complications without adequate follow up.  This format is felt to be most appropriate for her at this time.  I connected with this patient on 08/13/2019   by telephone and verified that I am speaking with the correct person using two identifiers. [name], Jan 05, 1929. she has verbally consented to this visit. All issues noted in this document were discussed and addressed. The format was not optimal for physical exam.    Subjective:    Patient ID: [name], female    DOB: 1929-09-09,    Past Medical History:  Diagnosis Date  . Diabetes mellitus without complication ([place])   . Diabetic neuropathy ([place])   . Hypertension   . Stroke ([place])   . Urinary incontinence    Past Surgical History:  Procedure Laterality Date  . ABDOMINAL HYSTERECTOMY    . APPENDECTOMY    . BREAST SURGERY     begnin tumor removed  . CATARACT EXTRACTION, BILATERAL     Social History   Socioeconomic History  . Marital status: Married    Spouse name: Ervin Rothbauer  . Number of children: 2  . Years of education: Not on file  . Highest education level: Not on file  Occupational History  . Occupation: retired    Comment: [employer]  . Financial resource strain: Not on file  . Food insecurity    Worry: Never true    Inability: Never true  . Transportation needs    Medical: No    Non-medical: No  Tobacco Use  . Smoking status: Never Smoker  . Smokeless tobacco: Never Used  Substance and Sexual Activity  . Alcohol use: No  . Drug use: No   . Sexual activity: Not on file  Lifestyle  . Physical activity    Days per week: 0 days    Minutes per session: 0 min  . Stress: Not at all  Relationships  . Social connections    Talks on phone: More than three times a week    Gets together: More than three times a week    Attends religious service: More than 4 times per year    Active member of club or organization: Not on file    Attends meetings of clubs or organizations: Not on file    Relationship status: Married  Other Topics Concern  . Not on file  Social History Narrative  . Not on file   Outpatient Encounter Medications as of 08/13/2019  Medication Sig  . amLODipine (NORVASC) 5 MG tablet Take 1 tablet (5 mg total) by mouth daily for 30 days.  . Cholecalciferol (VITAMIN D3) 5000 units CAPS Take 1 capsule (5,000 Units total) by mouth daily. (Patient taking differently: Take 5,000 Units by mouth every evening. )  . Cyanocobalamin (B-12 PO) Take 1 tablet by mouth every evening.  [name]  glipiZIDE (GLUCOTROL XL) 5 MG 24 hr tablet Take 1 tablet (5 mg total) by mouth 2 (two) times daily with a meal.  . Insulin Glargine (LANTUS SOLOSTAR) 100 UNIT/ML Solostar Pen Inject 60 Units into the skin daily.  . Magnesium 200 MG TABS Take 1 tablet (200 mg total) by mouth daily.  . metFORMIN (GLUCOPHAGE) 500 MG tablet Take 500 mg by mouth 2 (two) times daily after a meal.  . metoprolol succinate (TOPROL-XL) 50 MG 24 hr tablet Take 50 mg by mouth at bedtime. Take with or immediately following a meal.   . MYRBETRIQ 50 MG TB24 tablet Take 50 mg by mouth every evening.   . nystatin (MYCOSTATIN/NYSTOP) powder Apply topically 2 (two) times daily. Apply to affected skin for treatment of fungal infection. Please keep area clean and dry.  . simvastatin (ZOCOR) 20 MG tablet Take 20 mg by mouth every evening.  . sitaGLIPtin (JANUVIA) 50 MG tablet Take 50 mg by mouth daily.  . [DISCONTINUED] glipiZIDE (GLUCOTROL XL) 5 MG 24 hr tablet Take 1 tablet (5 mg total)  by mouth daily with breakfast.  . [DISCONTINUED] insulin glargine (LANTUS) 100 UNIT/ML injection Inject 0.6 mLs (60 Units total) into the skin daily.   No facility-administered encounter medications on file as of 08/13/2019.    ALLERGIES: Allergies  Allergen Reactions  . Codeine Nausea And Vomiting   VACCINATION STATUS: There is no immunization history for the selected administration types on file for this patient.  Diabetes She presents for her follow-up diabetic visit. She has type 2 diabetes mellitus. Onset time: She was diagnosed at approximate age of 55 years. Her disease course has been worsening. There are no hypoglycemic associated symptoms. Pertinent negatives for hypoglycemia include no confusion, headaches, pallor or seizures. Associated symptoms include polydipsia and polyuria. Pertinent negatives for diabetes include no chest pain, no fatigue and no polyphagia. There are no hypoglycemic complications. Symptoms are worsening. There are no diabetic complications. Risk factors for coronary artery disease include dyslipidemia, diabetes mellitus and hypertension. Current diabetic treatment includes oral agent (dual therapy). Her weight is fluctuating minimally. She is following a generally unhealthy diet. She has had a previous visit with a dietitian. Her home blood glucose trend is increasing steadily. Her breakfast blood glucose range is generally >200 mg/dl. Her lunch blood glucose range is generally >200 mg/dl. Her dinner blood glucose range is generally >200 mg/dl. Her bedtime blood glucose range is generally >200 mg/dl. Her overall blood glucose range is >200 mg/dl. An ACE inhibitor/angiotensin II receptor blocker is being taken.  Hyperlipidemia This is a chronic problem. The current episode started more than 1 year ago. Exacerbating diseases include diabetes and obesity. Pertinent negatives include no chest pain, myalgias or shortness of breath. Current antihyperlipidemic treatment  includes statins. Risk factors for coronary artery disease include diabetes mellitus, dyslipidemia, hypertension, a sedentary lifestyle, post-menopausal and obesity.  Hypertension This is a chronic problem. The current episode started more than 1 year ago. The problem is controlled. Pertinent negatives include no chest pain, headaches, palpitations or shortness of breath. Risk factors for coronary artery disease include dyslipidemia, diabetes mellitus, family history, obesity, sedentary lifestyle and post-menopausal state. Past treatments include ACE inhibitors. The current treatment provides moderate improvement.    Review of Systems  Constitutional: Negative for fatigue and unexpected weight change.  HENT: Negative for trouble swallowing and voice change.   Eyes: Negative for visual disturbance.  Respiratory: Negative for cough, shortness of breath and wheezing.   Cardiovascular: Negative for chest  pain, palpitations and leg swelling.  Gastrointestinal: Negative for diarrhea, nausea and vomiting.  Endocrine: Positive for polydipsia and polyuria. Negative for cold intolerance, heat intolerance and polyphagia.  Musculoskeletal: Positive for gait problem. Negative for arthralgias and myalgias.  Skin: Negative for color change, pallor, rash and wound.  Neurological: Negative for seizures and headaches.  Psychiatric/Behavioral: Negative for confusion and suicidal ideas.    Objective:    There were no vitals taken for this visit.  Wt Readings from Last 3 Encounters:  07/27/19 206 lb (93.4 kg)  07/15/19 205 lb 14.6 oz (93.4 kg)  06/01/19 200 lb (90.7 kg)     Diabetic Labs (most recent): Lab Results  Component Value Date   HGBA1C 12.3 (H) 07/14/2019   HGBA1C 14.0 (H) 02/08/2019   HGBA1C 9.0 (H) 11/17/2018   CMP     Component Value Date/Time   NA 140 07/15/2019 0621   NA 142 09/25/2018 1126   K 3.2 (L) 07/15/2019 0621   CL 105 07/15/2019 0621   CO2 24 07/15/2019 0621   GLUCOSE 206  (H) 07/15/2019 0621   BUN 11 07/15/2019 0621   BUN 24 09/25/2018 1126   CREATININE 0.73 07/15/2019 0621   CALCIUM 8.7 (L) 07/15/2019 0621   PROT 6.0 (L) 07/14/2019 0648   PROT 6.2 09/25/2018 1126   ALBUMIN 3.1 (L) 07/14/2019 0648   ALBUMIN 3.7 09/25/2018 1126   AST 16 07/14/2019 0648   ALT 15 07/14/2019 0648   ALKPHOS 89 07/14/2019 0648   BILITOT 1.0 07/14/2019 0648   BILITOT 0.6 09/25/2018 1126   GFRNONAA >60 07/15/2019 0621   GFRAA >60 07/15/2019 0621     Assessment & Plan:   1. Uncontrolled type 2 diabetes mellitus with CKD as a complication, without long-term current use of insulin ([place])  -She reports significantly above target glycemic profile, recent A1c of 12.3%.  This is despite increasing her basal insulin to Lantus 80 units nightly.    -She has no reported nor documented hypoglycemia.    - Patient remains at a high risk for more acute and chronic complications of diabetes which include CAD, CVA, CKD, retinopathy, and neuropathy. These are all discussed in detail with the patient.  - I have re-counseled the patient on diet management and weight loss  by adopting a carbohydrate restricted / protein rich  Diet. - Patient is advised to stick to a routine mealtimes to eat 3 meals  a day and avoid unnecessary snacks ( to snack only to correct hypoglycemia).  - she  admits there is a room for improvement in her diet and drink choices. -  Suggestion is made for her to avoid simple carbohydrates  from her diet including Cakes, Sweet Desserts / Pastries, Ice Cream, Soda (diet and regular), Sweet Tea, Candies, Chips, Cookies, Sweet Pastries,  Store Bought Juices, Alcohol in Excess of  1-2 drinks a day, Artificial Sweeteners, Coffee Creamer, and "Sugar-free" Products. This will help patient to have stable blood glucose profile and potentially avoid unintended weight gain.  - I have approached patient with the following individualized plan to manage diabetes and patient  agrees.   -She is advised to increase her Lantus to 60 units  BID, associated with strict monitoring of blood glucose 4 times a day-before meals and at bedtime.    -She is advised to continue Januvia 50 mg p.o. daily at breakfast, increase her metformin to 500 mg p.o. twice daily, continue glipizide 5 mg p.o. twice daily with breakfast and supper.   -  She may very soon require prandial insulin in order for her to achieve control of diabetes.  Due to her advanced age, this could be a complicated regimen unless she has a regular assistance from her aide.  -Patient is encouraged to call clinic for blood glucose levels less than 70 or above 300 mg /dlx3.  - Patient specific target  for A1c; LDL, HDL, Triglycerides, and  Waist Circumference were discussed in detail.  2) BP/HTN: she is advised to home monitor blood pressure and report if > 140/90 on 2 separate readings.  She is advised to continue her current blood pressure medications including lisinopril 20 mg p.o. twice daily.  3) Lipids/HPL: Her LDL is controlled at 48, has been taking simvastatin 20 mg p.o. daily for several years.   4)  Weight/Diet: CDE consult in progress, exercise, and carbohydrates information provided.  5)  Vitamin D deficiency,  - She is  s/p 12 weeks of  vitamin D therapy, 50,000 units weekly x 12 weeks.  6) hypomagnesemia: -Etiology unclear, she denies diarrhea. -She will benefit from magnesium supplement.  I discussed and added magnesium oxide 200 mg daily for 30 days.  6 Chronic Care/Health Maintenance:  -Patien is on ACEI and Statin medications and encouraged to continue to follow up with Ophthalmology, Podiatrist at least yearly or according to recommendations, and advised to stay away from smoking. I have recommended yearly flu vaccine and pneumonia vaccination at least every 5 years; and  sleep for at least 7 hours a day.  I advised patient to maintain close follow up with her PCP for primary care  needs.   - Patient Care Time Today:  25 min, of which >50% was spent in  counseling and the rest reviewing her  current and  previous labs/studies, previous treatments, her blood glucose readings, and medications' doses and developing a plan for long-term care based on the latest recommendations for standards of care.   [name] participated in the discussions, expressed understanding, and voiced agreement with the above plans.  All questions were answered to her satisfaction. she is encouraged to contact clinic should she have any questions or concerns prior to her return visit.   Follow up plan: Return in about 1 week (around 08/20/2019), or Phone, for Follow up with [facility] Only - no Labs.  Glade Lloyd, MD Phone: [phone]  Fax: [phone]  -  This note was partially dictated with voice recognition software. Similar sounding words can be transcribed inadequately or may not  be corrected upon review.  08/13/2019, 5:05 PM

## 2019-08-15 DIAGNOSIS — F0390 Unspecified dementia without behavioral disturbance: Secondary | ICD-10-CM | POA: Diagnosis not present

## 2019-08-15 DIAGNOSIS — E1142 Type 2 diabetes mellitus with diabetic polyneuropathy: Secondary | ICD-10-CM | POA: Diagnosis not present

## 2019-08-15 DIAGNOSIS — Z4800 Encounter for change or removal of nonsurgical wound dressing: Secondary | ICD-10-CM | POA: Diagnosis not present

## 2019-08-15 DIAGNOSIS — Z8673 Personal history of transient ischemic attack (TIA), and cerebral infarction without residual deficits: Secondary | ICD-10-CM | POA: Diagnosis not present

## 2019-08-15 DIAGNOSIS — S81802D Unspecified open wound, left lower leg, subsequent encounter: Secondary | ICD-10-CM | POA: Diagnosis not present

## 2019-08-15 DIAGNOSIS — L03116 Cellulitis of left lower limb: Secondary | ICD-10-CM | POA: Diagnosis not present

## 2019-08-15 DIAGNOSIS — E1165 Type 2 diabetes mellitus with hyperglycemia: Secondary | ICD-10-CM | POA: Diagnosis not present

## 2019-08-15 DIAGNOSIS — Z9181 History of falling: Secondary | ICD-10-CM | POA: Diagnosis not present

## 2019-08-15 DIAGNOSIS — E1310 Other specified diabetes mellitus with ketoacidosis without coma: Secondary | ICD-10-CM | POA: Diagnosis not present

## 2019-08-15 DIAGNOSIS — I10 Essential (primary) hypertension: Secondary | ICD-10-CM | POA: Diagnosis not present

## 2019-08-18 DIAGNOSIS — E1310 Other specified diabetes mellitus with ketoacidosis without coma: Secondary | ICD-10-CM | POA: Diagnosis not present

## 2019-08-18 DIAGNOSIS — E1165 Type 2 diabetes mellitus with hyperglycemia: Secondary | ICD-10-CM | POA: Diagnosis not present

## 2019-08-18 DIAGNOSIS — F0390 Unspecified dementia without behavioral disturbance: Secondary | ICD-10-CM | POA: Diagnosis not present

## 2019-08-18 DIAGNOSIS — I10 Essential (primary) hypertension: Secondary | ICD-10-CM | POA: Diagnosis not present

## 2019-08-18 DIAGNOSIS — E1142 Type 2 diabetes mellitus with diabetic polyneuropathy: Secondary | ICD-10-CM | POA: Diagnosis not present

## 2019-08-18 DIAGNOSIS — L03116 Cellulitis of left lower limb: Secondary | ICD-10-CM | POA: Diagnosis not present

## 2019-08-24 ENCOUNTER — Encounter: Payer: Self-pay | Admitting: "Endocrinology

## 2019-08-24 ENCOUNTER — Other Ambulatory Visit: Payer: Self-pay

## 2019-08-24 ENCOUNTER — Ambulatory Visit (INDEPENDENT_AMBULATORY_CARE_PROVIDER_SITE_OTHER): Payer: Medicare Other | Admitting: "Endocrinology

## 2019-08-24 DIAGNOSIS — E1129 Type 2 diabetes mellitus with other diabetic kidney complication: Secondary | ICD-10-CM | POA: Diagnosis not present

## 2019-08-24 DIAGNOSIS — E1165 Type 2 diabetes mellitus with hyperglycemia: Secondary | ICD-10-CM

## 2019-08-24 DIAGNOSIS — E785 Hyperlipidemia, unspecified: Secondary | ICD-10-CM | POA: Diagnosis not present

## 2019-08-24 DIAGNOSIS — M1991 Primary osteoarthritis, unspecified site: Secondary | ICD-10-CM | POA: Diagnosis not present

## 2019-08-24 DIAGNOSIS — N182 Chronic kidney disease, stage 2 (mild): Secondary | ICD-10-CM | POA: Diagnosis not present

## 2019-08-24 NOTE — Progress Notes (Signed)
08/24/2019                                                     Endocrinology Telehealth Visit Follow up Note -During COVID -19 Pandemic  This visit type was conducted due to national recommendations for restrictions regarding the COVID-19 Pandemic  in an effort to limit this patient's exposure and mitigate transmission of the corona virus.  Due to her co-morbid illnesses, [name] is at  moderate to high risk for complications without adequate follow up.  This format is felt to be most appropriate for her at this time.  I connected with this patient on 08/24/2019   by telephone and verified that I am speaking with the correct person using two identifiers. [place], Sep 13, 1929. she has verbally consented to this visit. All issues noted in this document were discussed and addressed. The format was not optimal for physical exam.    Subjective:    Patient ID: [name], female    DOB: [birth year],    Past Medical History:  Diagnosis Date  . Diabetes mellitus without complication ([place])   . Diabetic neuropathy ([place])   . Hypertension   . Stroke ([place])   . Urinary incontinence    Past Surgical History:  Procedure Laterality Date  . ABDOMINAL HYSTERECTOMY    . APPENDECTOMY    . BREAST SURGERY     begnin tumor removed  . CATARACT EXTRACTION, BILATERAL     Social History   Socioeconomic History  . Marital status: Married    Spouse name: Oluwatoyin Artuso  . Number of children: 2  . Years of education: Not on file  . Highest education level: Not on file  Occupational History  . Occupation: retired    Comment: [employer]  . Financial resource strain: Not on file  . Food insecurity    Worry: Never true    Inability: Never true  . Transportation needs    Medical: No    Non-medical: No  Tobacco Use  . Smoking status: Never Smoker  . Smokeless tobacco: Never Used  Substance and Sexual Activity  . Alcohol use: No  . Drug use: No   . Sexual activity: Not on file  Lifestyle  . Physical activity    Days per week: 0 days    Minutes per session: 0 min  . Stress: Not at all  Relationships  . Social connections    Talks on phone: More than three times a week    Gets together: More than three times a week    Attends religious service: More than 4 times per year    Active member of club or organization: Not on file    Attends meetings of clubs or organizations: Not on file    Relationship status: Married  Other Topics Concern  . Not on file  Social History Narrative  . Not on file   Outpatient Encounter Medications as of 08/24/2019  Medication Sig  . Insulin Glargine (LANTUS) 100 UNIT/ML Solostar Pen Inject 70 Units into the skin 2 (two) times daily at 8 am and 10 pm.  . amLODipine (NORVASC) 5 MG tablet Take 1 tablet (5 mg total) by mouth daily for 30 days.  . Cholecalciferol (VITAMIN D3) 5000 units CAPS Take 1 capsule (5,000 Units total) by  mouth daily. (Patient taking differently: Take 5,000 Units by mouth every evening. )  . Cyanocobalamin (B-12 PO) Take 1 tablet by mouth every evening.  [name] glipiZIDE (GLUCOTROL XL) 5 MG 24 hr tablet Take 1 tablet (5 mg total) by mouth 2 (two) times daily with a meal.  . Magnesium 200 MG TABS Take 1 tablet (200 mg total) by mouth daily.  . metFORMIN (GLUCOPHAGE) 500 MG tablet Take 500 mg by mouth 2 (two) times daily after a meal.  . metoprolol succinate (TOPROL-XL) 50 MG 24 hr tablet Take 50 mg by mouth at bedtime. Take with or immediately following a meal.   . MYRBETRIQ 50 MG TB24 tablet Take 50 mg by mouth every evening.   . nystatin (MYCOSTATIN/NYSTOP) powder Apply topically 2 (two) times daily. Apply to affected skin for treatment of fungal infection. Please keep area clean and dry.  . simvastatin (ZOCOR) 20 MG tablet Take 20 mg by mouth every evening.  . sitaGLIPtin (JANUVIA) 50 MG tablet Take 50 mg by mouth daily.  . [DISCONTINUED] Insulin Glargine (LANTUS SOLOSTAR) 100 UNIT/ML  Solostar Pen Inject 60 Units into the skin daily.   No facility-administered encounter medications on file as of 08/24/2019.    ALLERGIES: Allergies  Allergen Reactions  . Codeine Nausea And Vomiting   VACCINATION STATUS: There is no immunization history for the selected administration types on file for this patient.  Diabetes She presents for her follow-up diabetic visit. She has type 2 diabetes mellitus. Onset time: She was diagnosed at approximate age of 89 years. Her disease course has been worsening. There are no hypoglycemic associated symptoms. Pertinent negatives for hypoglycemia include no confusion, headaches, pallor or seizures. Associated symptoms include polydipsia and polyuria. Pertinent negatives for diabetes include no chest pain, no fatigue and no polyphagia. There are no hypoglycemic complications. Symptoms are improving. There are no diabetic complications. Risk factors for coronary artery disease include dyslipidemia, diabetes mellitus and hypertension. Current diabetic treatment includes oral agent (dual therapy). Her weight is fluctuating minimally. She is following a generally unhealthy diet. She has had a previous visit with a dietitian. Her home blood glucose trend is increasing steadily. Her breakfast blood glucose range is generally >200 mg/dl. Her lunch blood glucose range is generally >200 mg/dl. Her dinner blood glucose range is generally >200 mg/dl. Her bedtime blood glucose range is generally >200 mg/dl. Her overall blood glucose range is >200 mg/dl. An ACE inhibitor/angiotensin II receptor blocker is being taken.  Hyperlipidemia This is a chronic problem. The current episode started more than 1 year ago. Exacerbating diseases include diabetes and obesity. Pertinent negatives include no chest pain, myalgias or shortness of breath. Current antihyperlipidemic treatment includes statins. Risk factors for coronary artery disease include diabetes mellitus, dyslipidemia,  hypertension, a sedentary lifestyle, post-menopausal and obesity.  Hypertension This is a chronic problem. The current episode started more than 1 year ago. The problem is controlled. Pertinent negatives include no chest pain, headaches, palpitations or shortness of breath. Risk factors for coronary artery disease include dyslipidemia, diabetes mellitus, family history, obesity, sedentary lifestyle and post-menopausal state. Past treatments include ACE inhibitors. The current treatment provides moderate improvement.    Review of Systems  Constitutional: Negative for fatigue and unexpected weight change.  HENT: Negative for trouble swallowing and voice change.   Eyes: Negative for visual disturbance.  Respiratory: Negative for cough, shortness of breath and wheezing.   Cardiovascular: Negative for chest pain, palpitations and leg swelling.  Gastrointestinal: Negative for diarrhea, nausea and vomiting.  Endocrine: Positive for polydipsia and polyuria. Negative for cold intolerance, heat intolerance and polyphagia.  Musculoskeletal: Positive for gait problem. Negative for arthralgias and myalgias.  Skin: Negative for color change, pallor, rash and wound.  Neurological: Negative for seizures and headaches.  Psychiatric/Behavioral: Negative for confusion and suicidal ideas.    Objective:    There were no vitals taken for this visit.  Wt Readings from Last 3 Encounters:  07/27/19 206 lb (93.4 kg)  07/15/19 205 lb 14.6 oz (93.4 kg)  06/01/19 200 lb (90.7 kg)     Diabetic Labs (most recent): Lab Results  Component Value Date   HGBA1C 12.3 (H) 07/14/2019   HGBA1C 14.0 (H) 02/08/2019   HGBA1C 9.0 (H) 11/17/2018   CMP     Component Value Date/Time   NA 140 07/15/2019 0621   NA 142 09/25/2018 1126   K 3.2 (L) 07/15/2019 0621   CL 105 07/15/2019 0621   CO2 24 07/15/2019 0621   GLUCOSE 206 (H) 07/15/2019 0621   BUN 11 07/15/2019 0621   BUN 24 09/25/2018 1126   CREATININE 0.73  07/15/2019 0621   CALCIUM 8.7 (L) 07/15/2019 0621   PROT 6.0 (L) 07/14/2019 0648   PROT 6.2 09/25/2018 1126   ALBUMIN 3.1 (L) 07/14/2019 0648   ALBUMIN 3.7 09/25/2018 1126   AST 16 07/14/2019 0648   ALT 15 07/14/2019 0648   ALKPHOS 89 07/14/2019 0648   BILITOT 1.0 07/14/2019 0648   BILITOT 0.6 09/25/2018 1126   GFRNONAA >60 07/15/2019 0621   GFRAA >60 07/15/2019 0621     Assessment & Plan:   1. Uncontrolled type 2 diabetes mellitus with CKD as a complication, without long-term current use of insulin ([place])  -She reports slightly improving glycemic profile, still significantly above target.   No hypoglycemic reports are reported or documented.   - Patient remains at a high risk for more acute and chronic complications of diabetes which include CAD, CVA, CKD, retinopathy, and neuropathy. These are all discussed in detail with the patient.  - I have re-counseled the patient on diet management and weight loss  by adopting a carbohydrate restricted / protein rich  Diet. - Patient is advised to stick to a routine mealtimes to eat 3 meals  a day and avoid unnecessary snacks ( to snack only to correct hypoglycemia).  - she  admits there is a room for improvement in her diet and drink choices. -  Suggestion is made for her to avoid simple carbohydrates  from her diet including Cakes, Sweet Desserts / Pastries, Ice Cream, Soda (diet and regular), Sweet Tea, Candies, Chips, Cookies, Sweet Pastries,  Store Bought Juices, Alcohol in Excess of  1-2 drinks a day, Artificial Sweeteners, Coffee Creamer, and "Sugar-free" Products. This will help patient to have stable blood glucose profile and potentially avoid unintended weight gain.   - I have approached patient with the following individualized plan to manage diabetes and patient agrees.   -She is advised to increase her Lantus to 70 units twice daily, associated with strict monitoring of blood glucose 4 times a day-before meals and at bedtime.     -She is advised to continue Januvia 50 mg p.o. daily at breakfast, increase her metformin to 500 mg p.o. twice daily, continue glipizide 5 mg p.o. twice daily with breakfast and supper.   -She may very soon require prandial insulin in order for her to achieve control of diabetes.  Due to her advanced age, this could be a complicated regimen unless  she has a regular assistance from her aide.  -Patient is encouraged to call clinic for blood glucose levels less than 70 or above 300 mg /dlx3.  - Patient specific target  for A1c; LDL, HDL, Triglycerides, and  Waist Circumference were discussed in detail.  2) BP/HTN: she is advised to home monitor blood pressure and report if > 140/90 on 2 separate readings.  She is advised to continue her current blood pressure medications including lisinopril 20 mg p.o. twice daily.  3) Lipids/HPL: Her LDL is controlled at 48, has been taking simvastatin 20 mg p.o. daily for several years.   4)  Weight/Diet: CDE consult in progress, exercise, and carbohydrates information provided.  5)  Vitamin D deficiency,  - She is  s/p 12 weeks of  vitamin D therapy, 50,000 units weekly x 12 weeks.  6) hypomagnesemia: -Etiology unclear, she denies diarrhea. -She was recently initiated on magnesium supplement.  She is advised to continue magnesium oxide 200 mg daily for 30 days.  6 Chronic Care/Health Maintenance:  -Patien is on ACEI and Statin medications and encouraged to continue to follow up with Ophthalmology, Podiatrist at least yearly or according to recommendations, and advised to stay away from smoking. I have recommended yearly flu vaccine and pneumonia vaccination at least every 5 years; and  sleep for at least 7 hours a day.  I advised patient to maintain close follow up with her PCP for primary care needs.   - Patient Care Time Today:  25 min, of which >50% was spent in  counseling and the rest reviewing her  current and  previous labs/studies, previous  treatments, her blood glucose readings, and medications' doses and developing a plan for long-term care based on the latest recommendations for standards of care.   [name] participated in the discussions, expressed understanding, and voiced agreement with the above plans.  All questions were answered to her satisfaction. she is encouraged to contact clinic should she have any questions or concerns prior to her return visit.  Follow up plan: Return in about 2 weeks (around 09/07/2019) for Follow up with [facility] Only - no Labs.  Glade Lloyd, MD Phone: [phone]  Fax: [phone]  -  This note was partially dictated with voice recognition software. Similar sounding words can be transcribed inadequately or may not  be corrected upon review.  08/24/2019, 5:28 PM

## 2019-08-28 DIAGNOSIS — E1165 Type 2 diabetes mellitus with hyperglycemia: Secondary | ICD-10-CM | POA: Diagnosis not present

## 2019-08-28 DIAGNOSIS — E1142 Type 2 diabetes mellitus with diabetic polyneuropathy: Secondary | ICD-10-CM | POA: Diagnosis not present

## 2019-08-28 DIAGNOSIS — F0390 Unspecified dementia without behavioral disturbance: Secondary | ICD-10-CM | POA: Diagnosis not present

## 2019-08-28 DIAGNOSIS — E1310 Other specified diabetes mellitus with ketoacidosis without coma: Secondary | ICD-10-CM | POA: Diagnosis not present

## 2019-08-28 DIAGNOSIS — L03116 Cellulitis of left lower limb: Secondary | ICD-10-CM | POA: Diagnosis not present

## 2019-08-28 DIAGNOSIS — I10 Essential (primary) hypertension: Secondary | ICD-10-CM | POA: Diagnosis not present

## 2019-09-01 DIAGNOSIS — E1165 Type 2 diabetes mellitus with hyperglycemia: Secondary | ICD-10-CM | POA: Diagnosis not present

## 2019-09-01 DIAGNOSIS — E1142 Type 2 diabetes mellitus with diabetic polyneuropathy: Secondary | ICD-10-CM | POA: Diagnosis not present

## 2019-09-01 DIAGNOSIS — L03116 Cellulitis of left lower limb: Secondary | ICD-10-CM | POA: Diagnosis not present

## 2019-09-01 DIAGNOSIS — E1310 Other specified diabetes mellitus with ketoacidosis without coma: Secondary | ICD-10-CM | POA: Diagnosis not present

## 2019-09-01 DIAGNOSIS — F0390 Unspecified dementia without behavioral disturbance: Secondary | ICD-10-CM | POA: Diagnosis not present

## 2019-09-01 DIAGNOSIS — I10 Essential (primary) hypertension: Secondary | ICD-10-CM | POA: Diagnosis not present

## 2019-09-02 ENCOUNTER — Ambulatory Visit: Payer: Medicare Other | Admitting: Orthopedic Surgery

## 2019-09-07 ENCOUNTER — Other Ambulatory Visit: Payer: Self-pay

## 2019-09-07 ENCOUNTER — Ambulatory Visit (INDEPENDENT_AMBULATORY_CARE_PROVIDER_SITE_OTHER): Payer: Medicare Other | Admitting: "Endocrinology

## 2019-09-07 ENCOUNTER — Encounter: Payer: Self-pay | Admitting: "Endocrinology

## 2019-09-07 DIAGNOSIS — E1165 Type 2 diabetes mellitus with hyperglycemia: Secondary | ICD-10-CM | POA: Diagnosis not present

## 2019-09-07 DIAGNOSIS — Z6830 Body mass index (BMI) 30.0-30.9, adult: Secondary | ICD-10-CM | POA: Diagnosis not present

## 2019-09-07 DIAGNOSIS — I10 Essential (primary) hypertension: Secondary | ICD-10-CM | POA: Diagnosis not present

## 2019-09-07 DIAGNOSIS — E782 Mixed hyperlipidemia: Secondary | ICD-10-CM

## 2019-09-07 DIAGNOSIS — E6609 Other obesity due to excess calories: Secondary | ICD-10-CM

## 2019-09-07 NOTE — Progress Notes (Signed)
09/07/2019                                                     Endocrinology Telehealth Visit Follow up Note -During COVID -19 Pandemic  This visit type was conducted due to national recommendations for restrictions regarding the COVID-19 Pandemic  in an effort to limit this patient's exposure and mitigate transmission of the corona virus.  Due to her co-morbid illnesses, [name] is at  moderate to high risk for complications without adequate follow up.  This format is felt to be most appropriate for her at this time.  I connected with this patient on 09/07/2019   by telephone and verified that I am speaking with the correct person using two identifiers. Colt, 03/20/1929. she has verbally consented to this visit. All issues noted in this document were discussed and addressed. The format was not optimal for physical exam.    Subjective:    Patient ID: [name], female    DOB: 1929-06-20,    Past Medical History:  Diagnosis Date  . Diabetes mellitus without complication ([place])   . Diabetic neuropathy ([place])   . Hypertension   . Stroke ([place])   . Urinary incontinence    Past Surgical History:  Procedure Laterality Date  . ABDOMINAL HYSTERECTOMY    . APPENDECTOMY    . BREAST SURGERY     begnin tumor removed  . CATARACT EXTRACTION, BILATERAL     Social History   Socioeconomic History  . Marital status: Married    Spouse name: [name]  . Number of children: 2  . Years of education: Not on file  . Highest education level: Not on file  Occupational History  . Occupation: retired    Comment: [employer]  . Financial resource strain: Not on file  . Food insecurity    Worry: Never true    Inability: Never true  . Transportation needs    Medical: No    Non-medical: No  Tobacco Use  . Smoking status: Never Smoker  . Smokeless tobacco: Never Used  Substance and Sexual Activity  . Alcohol use: No  . Drug use: No   . Sexual activity: Not on file  Lifestyle  . Physical activity    Days per week: 0 days    Minutes per session: 0 min  . Stress: Not at all  Relationships  . Social connections    Talks on phone: More than three times a week    Gets together: More than three times a week    Attends religious service: More than 4 times per year    Active member of club or organization: Not on file    Attends meetings of clubs or organizations: Not on file    Relationship status: Married  Other Topics Concern  . Not on file  Social History Narrative  . Not on file   Outpatient Encounter Medications as of 09/07/2019  Medication Sig  . amLODipine (NORVASC) 5 MG tablet Take 1 tablet (5 mg total) by mouth daily for 30 days.  . Cholecalciferol (VITAMIN D3) 5000 units CAPS Take 1 capsule (5,000 Units total) by mouth daily. (Patient taking differently: Take 5,000 Units by mouth every evening. )  . Cyanocobalamin (B-12 PO) Take 1 tablet by mouth every evening.  [name]  glipiZIDE (GLUCOTROL XL) 5 MG 24 hr tablet Take 1 tablet (5 mg total) by mouth 2 (two) times daily with a meal.  . Insulin Glargine (LANTUS) 100 UNIT/ML Solostar Pen Inject 80 Units into the skin 2 (two) times daily at 8 am and 10 pm.  . Magnesium 200 MG TABS Take 1 tablet (200 mg total) by mouth daily.  . metFORMIN (GLUCOPHAGE) 500 MG tablet Take 500 mg by mouth 2 (two) times daily after a meal.  . metoprolol succinate (TOPROL-XL) 50 MG 24 hr tablet Take 50 mg by mouth at bedtime. Take with or immediately following a meal.   . MYRBETRIQ 50 MG TB24 tablet Take 50 mg by mouth every evening.   . nystatin (MYCOSTATIN/NYSTOP) powder Apply topically 2 (two) times daily. Apply to affected skin for treatment of fungal infection. Please keep area clean and dry.  . simvastatin (ZOCOR) 20 MG tablet Take 20 mg by mouth every evening.  . sitaGLIPtin (JANUVIA) 50 MG tablet Take 50 mg by mouth daily.   No facility-administered encounter medications on file as of  09/07/2019.    ALLERGIES: Allergies  Allergen Reactions  . Codeine Nausea And Vomiting   VACCINATION STATUS: There is no immunization history for the selected administration types on file for this patient.  Diabetes She presents for her follow-up diabetic visit. She has type 2 diabetes mellitus. Onset time: She was diagnosed at approximate age of 80 years. Her disease course has been worsening. There are no hypoglycemic associated symptoms. Pertinent negatives for hypoglycemia include no confusion, headaches, pallor or seizures. Associated symptoms include polydipsia and polyuria. Pertinent negatives for diabetes include no chest pain, no fatigue and no polyphagia. There are no hypoglycemic complications. Symptoms are worsening. There are no diabetic complications. Risk factors for coronary artery disease include dyslipidemia, diabetes mellitus and hypertension. Current diabetic treatment includes oral agent (dual therapy). Her weight is fluctuating minimally. She is following a generally unhealthy diet. She has had a previous visit with a dietitian. Her home blood glucose trend is increasing steadily. Her breakfast blood glucose range is generally >200 mg/dl. Her lunch blood glucose range is generally >200 mg/dl. Her dinner blood glucose range is generally >200 mg/dl. Her bedtime blood glucose range is generally >200 mg/dl. Her overall blood glucose range is >200 mg/dl. An ACE inhibitor/angiotensin II receptor blocker is being taken.  Hyperlipidemia This is a chronic problem. The current episode started more than 1 year ago. Exacerbating diseases include diabetes and obesity. Pertinent negatives include no chest pain, myalgias or shortness of breath. Current antihyperlipidemic treatment includes statins. Risk factors for coronary artery disease include diabetes mellitus, dyslipidemia, hypertension, a sedentary lifestyle, post-menopausal and obesity.  Hypertension This is a chronic problem. The current  episode started more than 1 year ago. The problem is controlled. Pertinent negatives include no chest pain, headaches, palpitations or shortness of breath. Risk factors for coronary artery disease include dyslipidemia, diabetes mellitus, family history, obesity, sedentary lifestyle and post-menopausal state. Past treatments include ACE inhibitors. The current treatment provides moderate improvement.   Review of systems: Limited as above.   Objective:    There were no vitals taken for this visit.  Wt Readings from Last 3 Encounters:  07/27/19 206 lb (93.4 kg)  07/15/19 205 lb 14.6 oz (93.4 kg)  06/01/19 200 lb (90.7 kg)     Diabetic Labs (most recent): Lab Results  Component Value Date   HGBA1C 12.3 (H) 07/14/2019   HGBA1C 14.0 (H) 02/08/2019   HGBA1C 9.0 (H)  11/17/2018   CMP     Component Value Date/Time   NA 140 07/15/2019 0621   NA 142 09/25/2018 1126   K 3.2 (L) 07/15/2019 0621   CL 105 07/15/2019 0621   CO2 24 07/15/2019 0621   GLUCOSE 206 (H) 07/15/2019 0621   BUN 11 07/15/2019 0621   BUN 24 09/25/2018 1126   CREATININE 0.73 07/15/2019 0621   CALCIUM 8.7 (L) 07/15/2019 0621   PROT 6.0 (L) 07/14/2019 0648   PROT 6.2 09/25/2018 1126   ALBUMIN 3.1 (L) 07/14/2019 0648   ALBUMIN 3.7 09/25/2018 1126   AST 16 07/14/2019 0648   ALT 15 07/14/2019 0648   ALKPHOS 89 07/14/2019 0648   BILITOT 1.0 07/14/2019 0648   BILITOT 0.6 09/25/2018 1126   GFRNONAA >60 07/15/2019 0621   GFRAA >60 07/15/2019 0621     Assessment & Plan:   1. Uncontrolled type 2 diabetes mellitus with CKD as a complication, without long-term current use of insulin ([place])  -She reports persistently above target glycemic profile , fasting between 181-247, prelunch between 268- 342, presupper between 246-390, bedtime between 302-379.  No hypoglycemic reports are reported or documented.   - Patient remains at a high risk for more acute and chronic complications of diabetes which include CAD, CVA, CKD,  retinopathy, and neuropathy. These are all discussed in detail with the patient.  - I have re-counseled the patient on diet management and weight loss  by adopting a carbohydrate restricted / protein rich  Diet. - Patient is advised to stick to a routine mealtimes to eat 3 meals  a day and avoid unnecessary snacks ( to snack only to correct hypoglycemia).  - she  admits there is a room for improvement in her diet and drink choices. -  Suggestion is made for her to avoid simple carbohydrates  from her diet including Cakes, Sweet Desserts / Pastries, Ice Cream, Soda (diet and regular), Sweet Tea, Candies, Chips, Cookies, Sweet Pastries,  Store Bought Juices, Alcohol in Excess of  1-2 drinks a day, Artificial Sweeteners, Coffee Creamer, and "Sugar-free" Products. This will help patient to have stable blood glucose profile and potentially avoid unintended weight gain.  - I have approached patient with the following individualized plan to manage diabetes and patient agrees.   -She will probably require intensive treatment with basal/bolus insulin in order for her to achieve control of diabetes to target. -However, patient with limited social support, will benefit from simplified treatment regimen.  For now, she is advised to increase Lantus to 80 units twice a day,  associated with strict monitoring of blood glucose 4 times a day-before meals and at bedtime.    -She is advised to continue Januvia 50 mg p.o. daily at breakfast, increase her metformin to 500 mg p.o. twice daily, continue glipizide 5 mg p.o. twice daily with breakfast and supper.   -She may very soon require prandial insulin in order for her to achieve control of diabetes.  Due to her advanced age, this could be a complicated regimen unless she has a regular assistance from her aide.  -Patient is encouraged to call clinic for blood glucose levels less than 70 or above 300 mg /dlx3.  - Patient specific target  for A1c; LDL, HDL,  Triglycerides, and  Waist Circumference were discussed in detail.  2) BP/HTN: she is advised to home monitor blood pressure and report if > 140/90 on 2 separate readings.  She is advised to continue her current blood pressure medications including  lisinopril 20 mg p.o. twice daily.  3) Lipids/HPL: Her LDL is controlled at 48, has been taking simvastatin 20 mg p.o. daily for several years.   4)  Weight/Diet: CDE consult in progress, exercise, and carbohydrates information provided.  5)  Vitamin D deficiency,  - She is  s/p 12 weeks of  vitamin D therapy, 50,000 units weekly x 12 weeks.  6) hypomagnesemia: -Etiology unclear, she denies diarrhea. -She was recently initiated on magnesium supplement.  She is advised to continue magnesium oxide 200 mg daily for 30 days.  6 Chronic Care/Health Maintenance:  -Patien is on ACEI and Statin medications and encouraged to continue to follow up with Ophthalmology, Podiatrist at least yearly or according to recommendations, and advised to stay away from smoking. I have recommended yearly flu vaccine and pneumonia vaccination at least every 5 years; and  sleep for at least 7 hours a day.  I advised patient to maintain close follow up with her PCP for primary care needs.   - Patient Care Time Today:  25 min, of which >50% was spent in  counseling and the rest reviewing her  current and  previous labs/studies, previous treatments, her blood glucose readings, and medications' doses and developing a plan for long-term care based on the latest recommendations for standards of care.   [name] participated in the discussions, expressed understanding, and voiced agreement with the above plans.  All questions were answered to her satisfaction. she is encouraged to contact clinic should she have any questions or concerns prior to her return visit.   Follow up plan: Return in about 2 weeks (around 09/21/2019) for Follow up with [facility] Only - no  Labs.  Glade Lloyd, MD Phone: [phone]  Fax: [phone]  -  This note was partially dictated with voice recognition software. Similar sounding words can be transcribed inadequately or may not  be corrected upon review.  09/07/2019, 2:25 PM

## 2019-09-09 ENCOUNTER — Ambulatory Visit (INDEPENDENT_AMBULATORY_CARE_PROVIDER_SITE_OTHER): Payer: Medicare Other | Admitting: Orthopedic Surgery

## 2019-09-09 ENCOUNTER — Other Ambulatory Visit: Payer: Self-pay

## 2019-09-09 DIAGNOSIS — F0390 Unspecified dementia without behavioral disturbance: Secondary | ICD-10-CM | POA: Diagnosis not present

## 2019-09-09 DIAGNOSIS — M25561 Pain in right knee: Secondary | ICD-10-CM

## 2019-09-09 DIAGNOSIS — M25562 Pain in left knee: Secondary | ICD-10-CM

## 2019-09-09 DIAGNOSIS — G8929 Other chronic pain: Secondary | ICD-10-CM

## 2019-09-09 DIAGNOSIS — I10 Essential (primary) hypertension: Secondary | ICD-10-CM | POA: Diagnosis not present

## 2019-09-09 DIAGNOSIS — E1310 Other specified diabetes mellitus with ketoacidosis without coma: Secondary | ICD-10-CM | POA: Diagnosis not present

## 2019-09-09 DIAGNOSIS — L03116 Cellulitis of left lower limb: Secondary | ICD-10-CM | POA: Diagnosis not present

## 2019-09-09 DIAGNOSIS — E1165 Type 2 diabetes mellitus with hyperglycemia: Secondary | ICD-10-CM | POA: Diagnosis not present

## 2019-09-09 DIAGNOSIS — E1142 Type 2 diabetes mellitus with diabetic polyneuropathy: Secondary | ICD-10-CM | POA: Diagnosis not present

## 2019-09-09 NOTE — Progress Notes (Signed)
REQ INJ BOTH KNEES   Procedure note for bilateral knee injections  Procedure note left knee injection verbal consent was obtained to inject left knee joint  Timeout was completed to confirm the site of injection  The medications used were 40 mg of Depo-Medrol and 1% lidocaine 3 cc  Anesthesia was provided by ethyl chloride and the skin was prepped with alcohol.  After cleaning the skin with alcohol a 20-gauge needle was used to inject the left knee joint. There were no complications. A sterile bandage was applied.   Procedure note right knee injection verbal consent was obtained to inject right knee joint  Timeout was completed to confirm the site of injection  The medications used were 40 mg of Depo-Medrol and 1% lidocaine 3 cc  Anesthesia was provided by ethyl chloride and the skin was prepped with alcohol.  After cleaning the skin with alcohol a 20-gauge needle was used to inject the right knee joint. There were no complications. A sterile bandage was applied.  Encounter Diagnosis  Name Primary?  . Chronic pain of both knees Yes

## 2019-09-15 ENCOUNTER — Other Ambulatory Visit: Payer: Self-pay | Admitting: "Endocrinology

## 2019-09-15 ENCOUNTER — Telehealth: Payer: Self-pay | Admitting: "Endocrinology

## 2019-09-15 ENCOUNTER — Telehealth: Payer: Self-pay

## 2019-09-15 MED ORDER — GABAPENTIN 300 MG PO CAPS: 300.0000 mg | ORAL_CAPSULE | Freq: Three times a day (TID) | ORAL | 2 refills | Status: DC

## 2019-09-15 MED ORDER — METFORMIN HCL 500 MG PO TABS: 500.0000 mg | ORAL_TABLET | Freq: Two times a day (BID) | ORAL | 2 refills | Status: DC

## 2019-09-15 NOTE — Telephone Encounter (Signed)
Yes , I will

## 2019-09-15 NOTE — Telephone Encounter (Signed)
Opened in error

## 2019-09-15 NOTE — Telephone Encounter (Addendum)
Pt needs refill on metFORMIN (GLUCOPHAGE) 500 MG tablet & her gabapentin (NEURONTIN) 300  and sent to [facility]

## 2019-09-15 NOTE — Telephone Encounter (Signed)
Dr Dorris Fetch do you want to refill Mrs Harrelsons Gabapentin, Please advise?

## 2019-09-16 NOTE — Telephone Encounter (Signed)
Patient is aware 

## 2019-09-23 ENCOUNTER — Ambulatory Visit: Payer: Medicare Other | Admitting: "Endocrinology

## 2019-09-23 DIAGNOSIS — E1129 Type 2 diabetes mellitus with other diabetic kidney complication: Secondary | ICD-10-CM | POA: Diagnosis not present

## 2019-09-23 DIAGNOSIS — N182 Chronic kidney disease, stage 2 (mild): Secondary | ICD-10-CM | POA: Diagnosis not present

## 2019-09-23 DIAGNOSIS — I10 Essential (primary) hypertension: Secondary | ICD-10-CM | POA: Diagnosis not present

## 2019-09-23 DIAGNOSIS — M1991 Primary osteoarthritis, unspecified site: Secondary | ICD-10-CM | POA: Diagnosis not present

## 2019-09-29 DIAGNOSIS — B351 Tinea unguium: Secondary | ICD-10-CM | POA: Diagnosis not present

## 2019-09-29 DIAGNOSIS — E1142 Type 2 diabetes mellitus with diabetic polyneuropathy: Secondary | ICD-10-CM | POA: Diagnosis not present

## 2019-09-30 ENCOUNTER — Other Ambulatory Visit: Payer: Self-pay

## 2019-09-30 ENCOUNTER — Ambulatory Visit (INDEPENDENT_AMBULATORY_CARE_PROVIDER_SITE_OTHER): Payer: Medicare Other | Admitting: "Endocrinology

## 2019-09-30 ENCOUNTER — Encounter: Payer: Self-pay | Admitting: "Endocrinology

## 2019-09-30 DIAGNOSIS — I10 Essential (primary) hypertension: Secondary | ICD-10-CM | POA: Diagnosis not present

## 2019-09-30 DIAGNOSIS — E1165 Type 2 diabetes mellitus with hyperglycemia: Secondary | ICD-10-CM

## 2019-09-30 DIAGNOSIS — E782 Mixed hyperlipidemia: Secondary | ICD-10-CM | POA: Diagnosis not present

## 2019-09-30 NOTE — Progress Notes (Signed)
09/30/2019                                                     Endocrinology Telehealth Visit Follow up Note -During COVID -19 Pandemic  This visit type was conducted due to national recommendations for restrictions regarding the COVID-19 Pandemic  in an effort to limit this patient's exposure and mitigate transmission of the corona virus.  Due to her co-morbid illnesses, [name] is at  moderate to high risk for complications without adequate follow up.  This format is felt to be most appropriate for her at this time.  I connected with this patient on 09/30/2019   by telephone and verified that I am speaking with the correct person using two identifiers. [place], Jun 15, 1929. she has verbally consented to this visit. All issues noted in this document were discussed and addressed. The format was not optimal for physical exam.    Subjective:    Patient ID: [name], female    DOB: 11-25-1929,    Past Medical History:  Diagnosis Date  . Diabetes mellitus without complication ([place])   . Diabetic neuropathy ([place])   . Hypertension   . Stroke ([place])   . Urinary incontinence    Past Surgical History:  Procedure Laterality Date  . ABDOMINAL HYSTERECTOMY    . APPENDECTOMY    . BREAST SURGERY     begnin tumor removed  . CATARACT EXTRACTION, BILATERAL     Social History   Socioeconomic History  . Marital status: Married    Spouse name: Giani Schaben  . Number of children: 2  . Years of education: Not on file  . Highest education level: Not on file  Occupational History  . Occupation: retired    Comment: [employer]  . Financial resource strain: Not on file  . Food insecurity    Worry: Never true    Inability: Never true  . Transportation needs    Medical: No    Non-medical: No  Tobacco Use  . Smoking status: Never Smoker  . Smokeless tobacco: Never Used  Substance and Sexual Activity  . Alcohol use: No  . Drug use: No   . Sexual activity: Not on file  Lifestyle  . Physical activity    Days per week: 0 days    Minutes per session: 0 min  . Stress: Not at all  Relationships  . Social connections    Talks on phone: More than three times a week    Gets together: More than three times a week    Attends religious service: More than 4 times per year    Active member of club or organization: Not on file    Attends meetings of clubs or organizations: Not on file    Relationship status: Married  Other Topics Concern  . Not on file  Social History Narrative  . Not on file   Outpatient Encounter Medications as of 09/30/2019  Medication Sig  . amLODipine (NORVASC) 5 MG tablet Take 1 tablet (5 mg total) by mouth daily for 30 days.  . Cholecalciferol (VITAMIN D3) 5000 units CAPS Take 1 capsule (5,000 Units total) by mouth daily. (Patient taking differently: Take 5,000 Units by mouth every evening. )  . Cyanocobalamin (B-12 PO) Take 1 tablet by mouth every evening.  [name]  gabapentin (NEURONTIN) 300 MG capsule Take 1 capsule (300 mg total) by mouth 3 (three) times daily.  [name] glipiZIDE (GLUCOTROL XL) 5 MG 24 hr tablet Take 1 tablet (5 mg total) by mouth 2 (two) times daily with a meal.  . Insulin Glargine (LANTUS) 100 UNIT/ML Solostar Pen Inject 80-100 Units into the skin 2 (two) times daily at 8 am and 10 pm.  . Magnesium 200 MG TABS Take 1 tablet (200 mg total) by mouth daily.  . metFORMIN (GLUCOPHAGE) 500 MG tablet Take 1 tablet (500 mg total) by mouth 2 (two) times daily after a meal.  . metoprolol succinate (TOPROL-XL) 50 MG 24 hr tablet Take 50 mg by mouth at bedtime. Take with or immediately following a meal.   . MYRBETRIQ 50 MG TB24 tablet Take 50 mg by mouth every evening.   . nystatin (MYCOSTATIN/NYSTOP) powder Apply topically 2 (two) times daily. Apply to affected skin for treatment of fungal infection. Please keep area clean and dry.  . simvastatin (ZOCOR) 20 MG tablet Take 20 mg by mouth every evening.  .  sitaGLIPtin (JANUVIA) 50 MG tablet Take 50 mg by mouth daily.   No facility-administered encounter medications on file as of 09/30/2019.    ALLERGIES: Allergies  Allergen Reactions  . Codeine Nausea And Vomiting   VACCINATION STATUS: There is no immunization history for the selected administration types on file for this patient.  Diabetes She presents for her follow-up diabetic visit. She has type 2 diabetes mellitus. Onset time: She was diagnosed at approximate age of 26 years. Her disease course has been worsening. There are no hypoglycemic associated symptoms. Pertinent negatives for hypoglycemia include no confusion, headaches, pallor or seizures. Associated symptoms include polydipsia and polyuria. Pertinent negatives for diabetes include no chest pain, no fatigue and no polyphagia. There are no hypoglycemic complications. Symptoms are improving. There are no diabetic complications. Risk factors for coronary artery disease include dyslipidemia, diabetes mellitus and hypertension. Current diabetic treatment includes oral agent (dual therapy). Her weight is fluctuating minimally. She is following a generally unhealthy diet. She has had a previous visit with a dietitian. Her home blood glucose trend is increasing steadily. Her breakfast blood glucose range is generally >200 mg/dl. Her lunch blood glucose range is generally >200 mg/dl. Her dinner blood glucose range is generally >200 mg/dl. Her bedtime blood glucose range is generally >200 mg/dl. Her overall blood glucose range is >200 mg/dl. (Her fasting glucose readings range from 180-220, however her postprandial glycemic profile average is still high at 250-350.  ) An ACE inhibitor/angiotensin II receptor blocker is being taken.  Hyperlipidemia This is a chronic problem. The current episode started more than 1 year ago. Exacerbating diseases include diabetes and obesity. Pertinent negatives include no chest pain, myalgias or shortness of breath.  Current antihyperlipidemic treatment includes statins. Risk factors for coronary artery disease include diabetes mellitus, dyslipidemia, hypertension, a sedentary lifestyle, post-menopausal and obesity.  Hypertension This is a chronic problem. The current episode started more than 1 year ago. The problem is controlled. Pertinent negatives include no chest pain, headaches, palpitations or shortness of breath. Risk factors for coronary artery disease include dyslipidemia, diabetes mellitus, family history, obesity, sedentary lifestyle and post-menopausal state. Past treatments include ACE inhibitors. The current treatment provides moderate improvement.   Review of systems: Limited as above.   Objective:    There were no vitals taken for this visit.  Wt Readings from Last 3 Encounters:  07/27/19 206 lb (93.4 kg)  07/15/19 205  lb 14.6 oz (93.4 kg)  06/01/19 200 lb (90.7 kg)     Diabetic Labs (most recent): Lab Results  Component Value Date   HGBA1C 12.3 (H) 07/14/2019   HGBA1C 14.0 (H) 02/08/2019   HGBA1C 9.0 (H) 11/17/2018   CMP     Component Value Date/Time   NA 140 07/15/2019 0621   NA 142 09/25/2018 1126   K 3.2 (L) 07/15/2019 0621   CL 105 07/15/2019 0621   CO2 24 07/15/2019 0621   GLUCOSE 206 (H) 07/15/2019 0621   BUN 11 07/15/2019 0621   BUN 24 09/25/2018 1126   CREATININE 0.73 07/15/2019 0621   CALCIUM 8.7 (L) 07/15/2019 0621   PROT 6.0 (L) 07/14/2019 0648   PROT 6.2 09/25/2018 1126   ALBUMIN 3.1 (L) 07/14/2019 0648   ALBUMIN 3.7 09/25/2018 1126   AST 16 07/14/2019 0648   ALT 15 07/14/2019 0648   ALKPHOS 89 07/14/2019 0648   BILITOT 1.0 07/14/2019 0648   BILITOT 0.6 09/25/2018 1126   GFRNONAA >60 07/15/2019 0621   GFRAA >60 07/15/2019 0621     Assessment & Plan:   1. Uncontrolled type 2 diabetes mellitus with CKD as a complication, without long-term current use of insulin ([place])  -She reports improving fasting, however persistently above target postprandial  glycemic profile , fasting between 181-220, prelunch between 229-327, presupper between 250-370, bedtime between 302-379.  No hypoglycemic reports are reported or documented.   - Patient remains at a high risk for more acute and chronic complications of diabetes which include CAD, CVA, CKD, retinopathy, and neuropathy. These are all discussed in detail with the patient.  - I have re-counseled the patient on diet management and weight loss  by adopting a carbohydrate restricted / protein rich  Diet. - Patient is advised to stick to a routine mealtimes to eat 3 meals  a day and avoid unnecessary snacks ( to snack only to correct hypoglycemia).  - she  admits there is a room for improvement in her diet and drink choices. -  Suggestion is made for her to avoid simple carbohydrates  from her diet including Cakes, Sweet Desserts / Pastries, Ice Cream, Soda (diet and regular), Sweet Tea, Candies, Chips, Cookies, Sweet Pastries,  Store Bought Juices, Alcohol in Excess of  1-2 drinks a day, Artificial Sweeteners, Coffee Creamer, and "Sugar-free" Products. This will help patient to have stable blood glucose profile and potentially avoid unintended weight gain.   - I have approached patient with the following individualized plan to manage diabetes and patient agrees.   -She will probably require intensive treatment with basal/bolus insulin in order for her to achieve control of diabetes to target. -For simplicity reasons, she is considered for premixed insulin such as Novolin 70/30 to take twice a day.  She has a large supply of Lantus at this time and she wishes to finish what she has.  She is advised to increase her Lantus to 100 units at breakfast, 80 units at bedtime. -However, patient with limited social support, will benefit from simplified treatment regimen.   -She is advised to continue strict monitoring of blood glucose 4 times a day-before meals and at bedtime.    -She is advised to continue  Januvia 50 mg p.o. daily at breakfast, increase her metformin to 500 mg p.o. twice daily, continue glipizide 5 mg p.o. twice daily with breakfast and supper.   -She may very soon require prandial insulin in order for her to achieve control of diabetes.  Due  to her advanced age, this could be a complicated regimen unless she has a regular assistance from her aide.  -Patient is encouraged to call clinic for blood glucose levels less than 70 or above 300 mg /dlx3.  - Patient specific target  for A1c; LDL, HDL, Triglycerides, and  Waist Circumference were discussed in detail.  2) BP/HTN: she is advised to home monitor blood pressure and report if > 140/90 on 2 separate readings.   She is advised to continue her current blood pressure medications including lisinopril 20 mg p.o. twice daily.   3) Lipids/HPL: Her LDL is controlled at 48, has been taking simvastatin 20 mg p.o. daily at bedtime.     4)  Weight/Diet: CDE consult in progress, exercise, and carbohydrates information provided.  5)  Vitamin D deficiency,  - She is status post 12 weeks of  vitamin D therapy, 50,000 units weekly x 12 weeks.   6 ) Chronic Care/Health Maintenance:  -Patien is on ACEI and Statin medications and encouraged to continue to follow up with Ophthalmology, Podiatrist at least yearly or according to recommendations, and advised to stay away from smoking. I have recommended yearly flu vaccine and pneumonia vaccination at least every 5 years; and  sleep for at least 7 hours a day.  I advised patient to maintain close follow up with her PCP for primary care needs.   - Patient Care Time Today:  25 min, of which >50% was spent in  counseling and the rest reviewing her  current and  previous labs/studies, previous treatments, her blood glucose readings, and medications' doses and developing a plan for long-term care based on the latest recommendations for standards of care.   [name] participated in the  discussions, expressed understanding, and voiced agreement with the above plans.  All questions were answered to her satisfaction. she is encouraged to contact clinic should she have any questions or concerns prior to her return visit.   Follow up plan: Return in about 5 weeks (around 11/04/2019), or office, for Bring Meter and Logs- A1c in Office.  Glade Lloyd, MD Phone: [phone]  Fax: [phone]  -  This note was partially dictated with voice recognition software. Similar sounding words can be transcribed inadequately or may not  be corrected upon review.  09/30/2019, 10:59 AM

## 2019-11-04 ENCOUNTER — Ambulatory Visit: Payer: Medicare Other | Admitting: "Endocrinology

## 2019-11-11 ENCOUNTER — Ambulatory Visit: Payer: Medicare Other | Admitting: "Endocrinology

## 2019-11-13 ENCOUNTER — Telehealth: Payer: Self-pay

## 2019-11-13 DIAGNOSIS — E1165 Type 2 diabetes mellitus with hyperglycemia: Secondary | ICD-10-CM

## 2019-11-13 MED ORDER — GLUCOSE BLOOD VI STRP: 1.0000 | ORAL_STRIP | Freq: Four times a day (QID) | Status: DC

## 2019-11-13 NOTE — Telephone Encounter (Signed)
LeighAnn Zacharie Portner, CMA  

## 2019-11-16 ENCOUNTER — Other Ambulatory Visit: Payer: Self-pay

## 2019-11-16 MED ORDER — ONETOUCH VERIO VI STRP: ORAL_STRIP | 5 refills | Status: DC

## 2019-11-17 ENCOUNTER — Other Ambulatory Visit: Payer: Self-pay

## 2019-11-17 MED ORDER — ONETOUCH VERIO VI STRP: ORAL_STRIP | 5 refills | Status: DC

## 2019-11-18 ENCOUNTER — Other Ambulatory Visit: Payer: Self-pay

## 2019-11-18 MED ORDER — ONETOUCH VERIO VI STRP: ORAL_STRIP | 5 refills | Status: DC

## 2019-11-30 ENCOUNTER — Encounter: Payer: Self-pay | Admitting: "Endocrinology

## 2019-11-30 ENCOUNTER — Ambulatory Visit (INDEPENDENT_AMBULATORY_CARE_PROVIDER_SITE_OTHER): Payer: Medicare Other | Admitting: "Endocrinology

## 2019-11-30 ENCOUNTER — Other Ambulatory Visit: Payer: Self-pay

## 2019-11-30 VITALS — BP 133/81 | HR 76 | Ht 69.0 in

## 2019-11-30 DIAGNOSIS — E1165 Type 2 diabetes mellitus with hyperglycemia: Secondary | ICD-10-CM | POA: Diagnosis not present

## 2019-11-30 DIAGNOSIS — E782 Mixed hyperlipidemia: Secondary | ICD-10-CM

## 2019-11-30 DIAGNOSIS — I10 Essential (primary) hypertension: Secondary | ICD-10-CM

## 2019-11-30 LAB — HEMOGLOBIN A1C: Hemoglobin A1C: 9.2

## 2019-11-30 MED ORDER — MAGNESIUM 200 MG PO TABS: 200.0000 mg | ORAL_TABLET | Freq: Every day | ORAL | 0 refills | Status: DC

## 2019-11-30 NOTE — Progress Notes (Signed)
12/01/2019   Endocrinology follow-up note    Subjective:    Patient ID: [name], female    DOB: 1929/12/04,    Past Medical History:  Diagnosis Date  . Diabetes mellitus without complication ([place])   . Diabetic neuropathy ([name])   . Hypertension   . Stroke ([place])   . Urinary incontinence    Past Surgical History:  Procedure Laterality Date  . ABDOMINAL HYSTERECTOMY    . APPENDECTOMY    . BREAST SURGERY     begnin tumor removed  . CATARACT EXTRACTION, BILATERAL     Social History   Socioeconomic History  . Marital status: Married    Spouse name: Hilda Elem  . Number of children: 2  . Years of education: Not on file  . Highest education level: Not on file  Occupational History  . Occupation: retired    Comment: [employer]  . Financial resource strain: Not on file  . Food insecurity    Worry: Never true    Inability: Never true  . Transportation needs    Medical: No    Non-medical: No  Tobacco Use  . Smoking status: Never Smoker  . Smokeless tobacco: Never Used  Substance and Sexual Activity  . Alcohol use: No  . Drug use: No  . Sexual activity: Not on file  Lifestyle  . Physical activity    Days per week: 0 days    Minutes per session: 0 min  . Stress: Not at all  Relationships  . Social connections    Talks on phone: More than three times a week    Gets together: More than three times a week    Attends religious service: More than 4 times per year    Active member of club or organization: Not on file    Attends meetings of clubs or organizations: Not on file    Relationship status: Married  Other Topics Concern  . Not on file  Social History Narrative  . Not on file   Outpatient Encounter Medications as of 11/30/2019  Medication Sig  . amLODipine (NORVASC) 5 MG tablet Take 1 tablet (5 mg total) by mouth daily for 30 days.  . Cholecalciferol (VITAMIN D3) 5000 units CAPS Take 1 capsule (5,000 Units total) by  mouth daily. (Patient taking differently: Take 5,000 Units by mouth every evening. )  . Cyanocobalamin (B-12 PO) Take 1 tablet by mouth every evening.  . gabapentin (NEURONTIN) 300 MG capsule Take 1 capsule (300 mg total) by mouth 3 (three) times daily.  [name] glipiZIDE (GLUCOTROL XL) 5 MG 24 hr tablet Take 1 tablet (5 mg total) by mouth 2 (two) times daily with a meal.  . Insulin Glargine (LANTUS) 100 UNIT/ML Solostar Pen Inject 100 Units into the skin 2 (two) times daily at 8 am and 10 pm.  . Magnesium 200 MG TABS Take 1 tablet (200 mg total) by mouth daily.  . metFORMIN (GLUCOPHAGE) 500 MG tablet Take 1 tablet (500 mg total) by mouth 2 (two) times daily after a meal.  . metoprolol succinate (TOPROL-XL) 50 MG 24 hr tablet Take 50 mg by mouth at bedtime. Take with or immediately following a meal.   . MYRBETRIQ 50 MG TB24 tablet Take 50 mg by mouth every evening.   . nystatin (MYCOSTATIN/NYSTOP) powder Apply topically 2 (two) times daily. Apply to affected skin for treatment of fungal infection. Please keep area clean and dry.  Glory Rosebush VERIO test strip TEST  3 TIMES DAILY  . simvastatin (ZOCOR) 20 MG tablet Take 20 mg by mouth every evening.  . sitaGLIPtin (JANUVIA) 50 MG tablet Take 50 mg by mouth daily.  . [DISCONTINUED] Magnesium 200 MG TABS Take 1 tablet (200 mg total) by mouth daily.   Facility-Administered Encounter Medications as of 11/30/2019  Medication  . glucose blood test strip STRP 1 each   ALLERGIES: Allergies  Allergen Reactions  . Codeine Nausea And Vomiting   VACCINATION STATUS: There is no immunization history for the selected administration types on file for this patient.  Diabetes She presents for her follow-up diabetic visit. She has type 2 diabetes mellitus. Onset time: She was diagnosed at approximate age of 83 years. Her disease course has been improving. There are no hypoglycemic associated symptoms. Pertinent negatives for hypoglycemia include no confusion,  headaches, pallor or seizures. Associated symptoms include polydipsia and polyuria. Pertinent negatives for diabetes include no chest pain, no fatigue and no polyphagia. There are no hypoglycemic complications. Symptoms are improving. There are no diabetic complications. Risk factors for coronary artery disease include dyslipidemia, diabetes mellitus and hypertension. Current diabetic treatment includes oral agent (dual therapy). Her weight is fluctuating minimally. She is following a generally unhealthy diet. She has had a previous visit with a dietitian. Her home blood glucose trend is increasing steadily. Her breakfast blood glucose range is generally >200 mg/dl. Her lunch blood glucose range is generally >200 mg/dl. Her dinner blood glucose range is generally >200 mg/dl. Her bedtime blood glucose range is generally >200 mg/dl. Her overall blood glucose range is >200 mg/dl. An ACE inhibitor/angiotensin II receptor blocker is being taken.  Hyperlipidemia This is a chronic problem. The current episode started more than 1 year ago. Exacerbating diseases include diabetes and obesity. Pertinent negatives include no chest pain, myalgias or shortness of breath. Current antihyperlipidemic treatment includes statins. Risk factors for coronary artery disease include diabetes mellitus, dyslipidemia, hypertension, a sedentary lifestyle, post-menopausal and obesity.  Hypertension This is a chronic problem. The current episode started more than 1 year ago. The problem is controlled. Pertinent negatives include no chest pain, headaches, palpitations or shortness of breath. Risk factors for coronary artery disease include dyslipidemia, diabetes mellitus, family history, obesity, sedentary lifestyle and post-menopausal state. Past treatments include ACE inhibitors. The current treatment provides moderate improvement.   Review of systems: Limited as above.   Objective:    BP 133/81   Pulse 76   Ht 5\' 9"  (1.753 m)    BMI 30.42 kg/m   Wt Readings from Last 3 Encounters:  07/27/19 206 lb (93.4 kg)  07/15/19 205 lb 14.6 oz (93.4 kg)  06/01/19 200 lb (90.7 kg)     Diabetic Labs (most recent): Lab Results  Component Value Date   HGBA1C 9.2 11/30/2019   HGBA1C 12.3 (H) 07/14/2019   HGBA1C 14.0 (H) 02/08/2019   CMP     Component Value Date/Time   NA 140 07/15/2019 0621   NA 142 09/25/2018 1126   K 3.2 (L) 07/15/2019 0621   CL 105 07/15/2019 0621   CO2 24 07/15/2019 0621   GLUCOSE 206 (H) 07/15/2019 0621   BUN 11 07/15/2019 0621   BUN 24 09/25/2018 1126   CREATININE 0.73 07/15/2019 0621   CALCIUM 8.7 (L) 07/15/2019 0621   PROT 6.0 (L) 07/14/2019 0648   PROT 6.2 09/25/2018 1126   ALBUMIN 3.1 (L) 07/14/2019 0648   ALBUMIN 3.7 09/25/2018 1126   AST 16 07/14/2019 0648   ALT 15 07/14/2019 [id number]  ALKPHOS 89 07/14/2019 0648   BILITOT 1.0 07/14/2019 0648   BILITOT 0.6 09/25/2018 1126   GFRNONAA >60 07/15/2019 0621   GFRAA >60 07/15/2019 [id number]     Assessment & Plan:   1. Uncontrolled type 2 diabetes mellitus with CKD as a complication, without long-term current use of insulin ([place])  -She presents with improved however still persistently above target glycemic profile.  No hypoglycemia.  Her point-of-care A1c was 9.2% improving from 12.3%.     - Patient remains at a high risk for more acute and chronic complications of diabetes which include CAD, CVA, CKD, retinopathy, and neuropathy. These are all discussed in detail with the patient.  - I have re-counseled the patient on diet management and weight loss  by adopting a carbohydrate restricted / protein rich  Diet. - Patient is advised to stick to a routine mealtimes to eat 3 meals  a day and avoid unnecessary snacks ( to snack only to correct hypoglycemia).  - she  admits there is a room for improvement in her diet and drink choices. -  Suggestion is made for her to avoid simple carbohydrates  from her diet including Cakes, Sweet Desserts /  Pastries, Ice Cream, Soda (diet and regular), Sweet Tea, Candies, Chips, Cookies, Sweet Pastries,  Store Bought Juices, Alcohol in Excess of  1-2 drinks a day, Artificial Sweeteners, Coffee Creamer, and "Sugar-free" Products. This will help patient to have stable blood glucose profile and potentially avoid unintended weight gain.  - I have approached patient with the following individualized plan to manage diabetes and patient agrees.   -She will  require intensive treatment with basal/bolus insulin in order for her to achieve control of diabetes to target. -For simplicity reasons, she will be considered for premixed insulin such as Novolin 70/30 to take twice a day, or insulin U500 to take 3 times a day with meals.  However, she has a large supply of Lantus at this time and she wishes to finish that supply before making the switch.  She is advised to increase her Lantus to 100 units twice daily associated with monitoring of blood glucose 4 times a day-before meals and at bedtime.     -She is advised to continue Januvia 50 mg p.o. daily at breakfast, increase her metformin to 500 mg p.o. twice daily, continue glipizide 5 mg p.o. twice daily with breakfast and supper.    -Patient is encouraged to call clinic for blood glucose levels less than 70 or above 300 mg /dlx3. -She will benefit from simplified treatment regimen due to her advanced age.  - Patient specific target  for A1c; LDL, HDL, Triglycerides, and  Waist Circumference were discussed in detail.  2) BP/HTN: Her blood pressure is controlled to target.   She is advised to continue her current blood pressure medications including lisinopril 20 mg p.o. twice daily.   3) Lipids/HPL: Her LDL is controlled at 48, has been taking simvastatin 20 mg p.o. nightly.      4)  Weight/Diet: CDE consult in progress, exercise, and carbohydrates information provided.  5)  Vitamin D deficiency,  - She is status post 12 weeks of  vitamin D therapy,  50,000 units weekly x 12 weeks.   6 ) Chronic Care/Health Maintenance:  -Patien is on ACEI and Statin medications and encouraged to continue to follow up with Ophthalmology, Podiatrist at least yearly or according to recommendations, and advised to stay away from smoking. I have recommended yearly flu vaccine and pneumonia vaccination  at least every 5 years; and  sleep for at least 7 hours a day.  I advised patient to maintain close follow up with her PCP for primary care needs.   - Patient Care Time Today:  25 min, of which >50% was spent in  counseling and the rest reviewing her  current and  previous labs/studies, previous treatments, her blood glucose readings, and medications' doses and developing a plan for long-term care based on the latest recommendations for standards of care.   [name] participated in the discussions, expressed understanding, and voiced agreement with the above plans.  All questions were answered to her satisfaction. she is encouraged to contact clinic should she have any questions or concerns prior to her return visit.   Follow up plan: Return in about 9 weeks (around 02/01/2020), or office, for Bring Meter and Logs- A1c in Office.  Glade Lloyd, MD Phone: [phone]  Fax: [phone]  -  This note was partially dictated with voice recognition software. Similar sounding words can be transcribed inadequately or may not  be corrected upon review.  12/01/2019, 8:27 AM

## 2019-12-01 DIAGNOSIS — E119 Type 2 diabetes mellitus without complications: Secondary | ICD-10-CM | POA: Diagnosis not present

## 2019-12-01 LAB — POCT GLYCOSYLATED HEMOGLOBIN (HGB A1C): Hemoglobin A1C: 9.2 % — AB (ref 4.0–5.6)

## 2019-12-03 LAB — HM DIABETES EYE EXAM

## 2019-12-07 ENCOUNTER — Telehealth: Payer: Self-pay | Admitting: "Endocrinology

## 2019-12-07 ENCOUNTER — Other Ambulatory Visit: Payer: Self-pay

## 2019-12-07 MED ORDER — GABAPENTIN 300 MG PO CAPS: 300.0000 mg | ORAL_CAPSULE | Freq: Three times a day (TID) | ORAL | 1 refills | Status: DC

## 2019-12-07 NOTE — Telephone Encounter (Signed)
Please call pt

## 2019-12-07 NOTE — Telephone Encounter (Signed)
Left msg for pt to call back

## 2019-12-09 ENCOUNTER — Ambulatory Visit: Payer: Medicare Other | Admitting: Orthopedic Surgery

## 2019-12-10 NOTE — Telephone Encounter (Signed)
Went over medications with pt.

## 2019-12-14 ENCOUNTER — Other Ambulatory Visit: Payer: Self-pay | Admitting: "Endocrinology

## 2020-01-04 ENCOUNTER — Ambulatory Visit: Payer: Medicare Other | Admitting: Orthopedic Surgery

## 2020-01-07 DIAGNOSIS — Z23 Encounter for immunization: Secondary | ICD-10-CM | POA: Diagnosis not present

## 2020-01-08 ENCOUNTER — Telehealth: Payer: Self-pay | Admitting: "Endocrinology

## 2020-01-08 ENCOUNTER — Telehealth: Payer: Self-pay

## 2020-01-08 MED ORDER — ONETOUCH VERIO VI STRP: ORAL_STRIP | 5 refills | Status: DC

## 2020-01-08 NOTE — Telephone Encounter (Signed)
Pt requesting   glucose blood test strip STRP 1 each   [facility] [place]

## 2020-01-08 NOTE — Telephone Encounter (Signed)
Rx refill sent for one touch verio test strip to test 4x/day to [facility].

## 2020-01-11 ENCOUNTER — Other Ambulatory Visit: Payer: Self-pay | Admitting: "Endocrinology

## 2020-01-11 MED ORDER — ONETOUCH VERIO VI STRP: ORAL_STRIP | 1 refills | Status: DC

## 2020-01-11 NOTE — Telephone Encounter (Signed)
[place] [facility] called and said that medicare part B will only cover 300 test strips per every 3 months. Would you be able to change her RX to testing 3x a day so that it is covered?

## 2020-01-11 NOTE — Telephone Encounter (Signed)
Done

## 2020-01-22 DIAGNOSIS — E785 Hyperlipidemia, unspecified: Secondary | ICD-10-CM | POA: Diagnosis not present

## 2020-01-22 DIAGNOSIS — Z6834 Body mass index (BMI) 34.0-34.9, adult: Secondary | ICD-10-CM | POA: Diagnosis not present

## 2020-01-22 DIAGNOSIS — E1165 Type 2 diabetes mellitus with hyperglycemia: Secondary | ICD-10-CM | POA: Diagnosis not present

## 2020-01-22 DIAGNOSIS — Z0000 Encounter for general adult medical examination without abnormal findings: Secondary | ICD-10-CM | POA: Diagnosis not present

## 2020-01-22 DIAGNOSIS — E663 Overweight: Secondary | ICD-10-CM | POA: Diagnosis not present

## 2020-01-22 DIAGNOSIS — E6609 Other obesity due to excess calories: Secondary | ICD-10-CM | POA: Diagnosis not present

## 2020-01-22 DIAGNOSIS — Z1389 Encounter for screening for other disorder: Secondary | ICD-10-CM | POA: Diagnosis not present

## 2020-01-22 DIAGNOSIS — E039 Hypothyroidism, unspecified: Secondary | ICD-10-CM | POA: Diagnosis not present

## 2020-01-22 DIAGNOSIS — I10 Essential (primary) hypertension: Secondary | ICD-10-CM | POA: Diagnosis not present

## 2020-01-25 ENCOUNTER — Ambulatory Visit: Payer: Medicare Other | Admitting: Orthopedic Surgery

## 2020-02-01 ENCOUNTER — Other Ambulatory Visit: Payer: Self-pay | Admitting: "Endocrinology

## 2020-02-01 NOTE — Telephone Encounter (Signed)
pt requesting ONETOUCH VERIO test strip [facility] [place]

## 2020-02-03 ENCOUNTER — Ambulatory Visit: Payer: Medicare Other | Admitting: "Endocrinology

## 2020-02-03 ENCOUNTER — Ambulatory Visit: Payer: Medicare Other | Admitting: Orthopedic Surgery

## 2020-02-09 ENCOUNTER — Telehealth: Payer: Self-pay | Admitting: "Endocrinology

## 2020-02-09 ENCOUNTER — Other Ambulatory Visit: Payer: Self-pay

## 2020-02-09 MED ORDER — ONETOUCH VERIO VI STRP: ORAL_STRIP | 1 refills | Status: DC

## 2020-02-09 NOTE — Telephone Encounter (Signed)
Pt said she still does not have test strips. They were called in back in January. Can you look into this and call pt

## 2020-02-09 NOTE — Telephone Encounter (Signed)
Rx refill for test strips sent to [facility].

## 2020-02-10 ENCOUNTER — Ambulatory Visit: Payer: Medicare Other | Admitting: "Endocrinology

## 2020-02-17 ENCOUNTER — Ambulatory Visit: Payer: Medicare Other | Admitting: "Endocrinology

## 2020-02-29 ENCOUNTER — Other Ambulatory Visit: Payer: Self-pay

## 2020-02-29 ENCOUNTER — Encounter (HOSPITAL_COMMUNITY): Payer: Self-pay | Admitting: *Deleted

## 2020-02-29 ENCOUNTER — Ambulatory Visit: Payer: Medicare Other | Admitting: "Endocrinology

## 2020-02-29 ENCOUNTER — Observation Stay (HOSPITAL_COMMUNITY): Payer: Medicare Other

## 2020-02-29 ENCOUNTER — Emergency Department (HOSPITAL_COMMUNITY): Payer: Medicare Other

## 2020-02-29 ENCOUNTER — Other Ambulatory Visit (HOSPITAL_COMMUNITY): Payer: Medicare Other

## 2020-02-29 ENCOUNTER — Observation Stay (HOSPITAL_COMMUNITY)
Admission: EM | Admit: 2020-02-29 | Discharge: 2020-03-01 | Disposition: A | Discharge status: Home / Self Care | Payer: Medicare Other | Attending: Internal Medicine | Admitting: Internal Medicine

## 2020-02-29 DIAGNOSIS — R29810 Facial weakness: Secondary | ICD-10-CM | POA: Diagnosis not present

## 2020-02-29 DIAGNOSIS — G459 Transient cerebral ischemic attack, unspecified: Principal | ICD-10-CM | POA: Diagnosis present

## 2020-02-29 DIAGNOSIS — M6281 Muscle weakness (generalized): Secondary | ICD-10-CM | POA: Diagnosis not present

## 2020-02-29 DIAGNOSIS — E785 Hyperlipidemia, unspecified: Secondary | ICD-10-CM

## 2020-02-29 DIAGNOSIS — Z03818 Encounter for observation for suspected exposure to other biological agents ruled out: Secondary | ICD-10-CM | POA: Diagnosis not present

## 2020-02-29 DIAGNOSIS — R4701 Aphasia: Secondary | ICD-10-CM | POA: Diagnosis present

## 2020-02-29 DIAGNOSIS — R001 Bradycardia, unspecified: Secondary | ICD-10-CM | POA: Diagnosis not present

## 2020-02-29 DIAGNOSIS — R29818 Other symptoms and signs involving the nervous system: Secondary | ICD-10-CM | POA: Diagnosis not present

## 2020-02-29 DIAGNOSIS — R0902 Hypoxemia: Secondary | ICD-10-CM | POA: Diagnosis not present

## 2020-02-29 DIAGNOSIS — E119 Type 2 diabetes mellitus without complications: Secondary | ICD-10-CM | POA: Insufficient documentation

## 2020-02-29 DIAGNOSIS — Z79899 Other long term (current) drug therapy: Secondary | ICD-10-CM | POA: Diagnosis not present

## 2020-02-29 DIAGNOSIS — Z7984 Long term (current) use of oral hypoglycemic drugs: Secondary | ICD-10-CM | POA: Diagnosis not present

## 2020-02-29 DIAGNOSIS — I10 Essential (primary) hypertension: Secondary | ICD-10-CM

## 2020-02-29 DIAGNOSIS — I63211 Cerebral infarction due to unspecified occlusion or stenosis of right vertebral arteries: Secondary | ICD-10-CM

## 2020-02-29 DIAGNOSIS — R4781 Slurred speech: Secondary | ICD-10-CM | POA: Diagnosis not present

## 2020-02-29 DIAGNOSIS — I6523 Occlusion and stenosis of bilateral carotid arteries: Secondary | ICD-10-CM | POA: Diagnosis not present

## 2020-02-29 DIAGNOSIS — Z20822 Contact with and (suspected) exposure to covid-19: Secondary | ICD-10-CM | POA: Insufficient documentation

## 2020-02-29 DIAGNOSIS — R531 Weakness: Secondary | ICD-10-CM | POA: Diagnosis not present

## 2020-02-29 DIAGNOSIS — I6322 Cerebral infarction due to unspecified occlusion or stenosis of basilar arteries: Secondary | ICD-10-CM

## 2020-02-29 DIAGNOSIS — I639 Cerebral infarction, unspecified: Secondary | ICD-10-CM | POA: Insufficient documentation

## 2020-02-29 DIAGNOSIS — I6381 Other cerebral infarction due to occlusion or stenosis of small artery: Secondary | ICD-10-CM

## 2020-02-29 DIAGNOSIS — R42 Dizziness and giddiness: Secondary | ICD-10-CM | POA: Diagnosis not present

## 2020-02-29 LAB — DIFFERENTIAL
Abs Immature Granulocytes: 0.02 10*3/uL (ref 0.00–0.07)
Basophils Absolute: 0.1 10*3/uL (ref 0.0–0.1)
Basophils Relative: 1 %
Eosinophils Absolute: 0.4 10*3/uL (ref 0.0–0.5)
Eosinophils Relative: 5 %
Immature Granulocytes: 0 %
Lymphocytes Relative: 26 %
Lymphs Abs: 2.1 10*3/uL (ref 0.7–4.0)
Monocytes Absolute: 0.8 10*3/uL (ref 0.1–1.0)
Monocytes Relative: 10 %
Neutro Abs: 4.7 10*3/uL (ref 1.7–7.7)
Neutrophils Relative %: 58 %

## 2020-02-29 LAB — CBC
HCT: 38.3 % (ref 36.0–46.0)
HCT: 39.9 % (ref 36.0–46.0)
Hemoglobin: 12.4 g/dL (ref 12.0–15.0)
Hemoglobin: 13 g/dL (ref 12.0–15.0)
MCH: 29 pg (ref 26.0–34.0)
MCH: 29 pg (ref 26.0–34.0)
MCHC: 32.4 g/dL (ref 30.0–36.0)
MCHC: 32.6 g/dL (ref 30.0–36.0)
MCV: 89.1 fL (ref 80.0–100.0)
MCV: 89.5 fL (ref 80.0–100.0)
Platelets: 241 10*3/uL (ref 150–400)
Platelets: 247 10*3/uL (ref 150–400)
RBC: 4.28 MIL/uL (ref 3.87–5.11)
RBC: 4.48 MIL/uL (ref 3.87–5.11)
RDW: 12.4 % (ref 11.5–15.5)
RDW: 12.5 % (ref 11.5–15.5)
WBC: 7 10*3/uL (ref 4.0–10.5)
WBC: 8.1 10*3/uL (ref 4.0–10.5)
nRBC: 0 % (ref 0.0–0.2)
nRBC: 0 % (ref 0.0–0.2)

## 2020-02-29 LAB — COMPREHENSIVE METABOLIC PANEL
ALT: 13 U/L (ref 0–44)
AST: 16 U/L (ref 15–41)
Albumin: 3.3 g/dL — ABNORMAL LOW (ref 3.5–5.0)
Alkaline Phosphatase: 85 U/L (ref 38–126)
Anion gap: 9 (ref 5–15)
BUN: 17 mg/dL (ref 8–23)
CO2: 25 mmol/L (ref 22–32)
Calcium: 8.8 mg/dL — ABNORMAL LOW (ref 8.9–10.3)
Chloride: 101 mmol/L (ref 98–111)
Creatinine, Ser: 0.95 mg/dL (ref 0.44–1.00)
GFR calc Af Amer: >60 mL/min (ref >60)
GFR calc non Af Amer: 53 mL/min — ABNORMAL LOW (ref >60)
Glucose, Bld: 329 mg/dL — ABNORMAL HIGH (ref 70–99)
Potassium: 3.8 mmol/L (ref 3.5–5.1)
Sodium: 135 mmol/L (ref 135–145)
Total Bilirubin: 0.5 mg/dL (ref 0.3–1.2)
Total Protein: 6.6 g/dL (ref 6.5–8.1)

## 2020-02-29 LAB — CBG MONITORING, ED: Glucose-Capillary: 190 mg/dL — ABNORMAL HIGH (ref 70–99)

## 2020-02-29 LAB — PROTIME-INR
INR: 0.9 (ref 0.8–1.2)
Prothrombin Time: 12.4 seconds (ref 11.4–15.2)

## 2020-02-29 LAB — CREATININE, SERUM
Creatinine, Ser: 0.82 mg/dL (ref 0.44–1.00)
GFR calc Af Amer: >60 mL/min (ref >60)
GFR calc non Af Amer: >60 mL/min (ref >60)

## 2020-02-29 LAB — ETHANOL: Alcohol, Ethyl (B): <10 mg/dL (ref <10)

## 2020-02-29 LAB — GLUCOSE, CAPILLARY
Glucose-Capillary: 164 mg/dL — ABNORMAL HIGH (ref 70–99)
Glucose-Capillary: 185 mg/dL — ABNORMAL HIGH (ref 70–99)

## 2020-02-29 LAB — APTT: aPTT: 27 seconds (ref 24–36)

## 2020-02-29 LAB — SARS CORONAVIRUS 2 (TAT 6-24 HRS): SARS Coronavirus 2: NEGATIVE

## 2020-02-29 MED ORDER — ACETAMINOPHEN 650 MG RE SUPP: 650.0000 mg | RECTAL | Status: DC | PRN

## 2020-02-29 MED ORDER — ACETAMINOPHEN 325 MG PO TABS: 650.0000 mg | ORAL_TABLET | ORAL | Status: DC | PRN

## 2020-02-29 MED ORDER — STROKE: EARLY STAGES OF RECOVERY BOOK: Freq: Once | Status: AC

## 2020-02-29 MED ORDER — GABAPENTIN 300 MG PO CAPS
300.0000 mg | ORAL_CAPSULE | Freq: Three times a day (TID) | ORAL | Status: DC
  Administered 2020-02-29 – 2020-03-01 (×5): 300 mg via ORAL
  Filled 2020-02-29 (×5): qty 1

## 2020-02-29 MED ORDER — ACETAMINOPHEN 160 MG/5ML PO SOLN: 650.0000 mg | ORAL | Status: DC | PRN

## 2020-02-29 MED ORDER — ASPIRIN 325 MG PO TABS
325.0000 mg | ORAL_TABLET | Freq: Every day | ORAL | Status: DC
  Administered 2020-03-01: 325 mg via ORAL
  Filled 2020-02-29: qty 1

## 2020-02-29 MED ORDER — ASPIRIN 300 MG RE SUPP: 300.0000 mg | Freq: Every day | RECTAL | Status: DC

## 2020-02-29 MED ORDER — MIRABEGRON ER 25 MG PO TB24
50.0000 mg | ORAL_TABLET | Freq: Every evening | ORAL | Status: DC
  Administered 2020-02-29: 50 mg via ORAL
  Filled 2020-02-29 (×3): qty 2

## 2020-02-29 MED ORDER — MELATONIN 3 MG PO TABS
6.0000 mg | ORAL_TABLET | Freq: Every evening | ORAL | Status: DC | PRN
  Administered 2020-02-29: 6 mg via ORAL
  Filled 2020-02-29: qty 2

## 2020-02-29 MED ORDER — INSULIN ASPART 100 UNIT/ML ~~LOC~~ SOLN
0.0000 [IU] | SUBCUTANEOUS | Status: DC
  Administered 2020-02-29 (×3): 2 [IU] via SUBCUTANEOUS
  Administered 2020-03-01: 3 [IU] via SUBCUTANEOUS
  Administered 2020-03-01: 1 [IU] via SUBCUTANEOUS

## 2020-02-29 MED ORDER — ENOXAPARIN SODIUM 40 MG/0.4ML ~~LOC~~ SOLN
40.0000 mg | SUBCUTANEOUS | Status: DC
  Administered 2020-02-29 – 2020-03-01 (×2): 40 mg via SUBCUTANEOUS
  Filled 2020-02-29 (×2): qty 0.4

## 2020-02-29 MED ORDER — INSULIN GLARGINE 100 UNIT/ML ~~LOC~~ SOLN
100.0000 [IU] | Freq: Two times a day (BID) | SUBCUTANEOUS | Status: DC
  Filled 2020-02-29 (×3): qty 1

## 2020-02-29 MED ORDER — SIMVASTATIN 20 MG PO TABS
20.0000 mg | ORAL_TABLET | Freq: Every evening | ORAL | Status: DC
  Administered 2020-02-29: 20 mg via ORAL
  Filled 2020-02-29: qty 1

## 2020-02-29 MED ORDER — ASPIRIN 325 MG PO TABS
325.0000 mg | ORAL_TABLET | Freq: Once | ORAL | Status: AC
  Administered 2020-02-29: 325 mg via ORAL
  Filled 2020-02-29: qty 1

## 2020-02-29 MED ORDER — NYSTATIN 100000 UNIT/GM EX CREA
TOPICAL_CREAM | Freq: Two times a day (BID) | CUTANEOUS | Status: DC
  Administered 2020-03-01: 1 via TOPICAL
  Filled 2020-02-29 (×2): qty 15

## 2020-02-29 MED ORDER — INSULIN GLARGINE 100 UNIT/ML ~~LOC~~ SOLN
70.0000 [IU] | Freq: Two times a day (BID) | SUBCUTANEOUS | Status: DC
  Administered 2020-02-29 – 2020-03-01 (×2): 70 [IU] via SUBCUTANEOUS
  Filled 2020-02-29 (×4): qty 0.7

## 2020-02-29 MED ORDER — SODIUM CHLORIDE 0.9 % IV SOLN: INTRAVENOUS | Status: DC

## 2020-02-29 NOTE — Consult Note (Signed)
Gackle A. Merlene Laughter, MD     [email or web address]          [name] is an 86 y.o. female.   ASSESSMENT/PLAN: 1. Acute dysarthria and dizziness: The patient most likely have had a small subcortical infarct that is nonlocalizing. Risk factors include age, hypertension diabetes. Aspirin 325 is recommended for now. The typical workup has been initiated. MRI is not available today but should be available tomorrow. 2.  Possible cellulitis of the lower extremities: Deferred to primary team 3. History of although slight hypertension. 4. History of mild cognitive impairment    This is an 86 year old white female who developed the acute onset of dysarthria and dizziness. She has a complaint of heaviness and swelling of the tongue although this was not noted on initial presentation. She reports that the symptom lasted for about 30 minutes before she decided to seek medical attention. She presented to the hospital and had a workup which was mostly unrevealing. She reports that by the time of the test were done in the emergency room she was sent to the floor, her symptoms resolved. It appears that her symptoms probably lasted for few hours. She denies any palpitation, chest pain or shortness of breath. She denies any focal weakness or numbness of the upper extremities. There are no reports of headaches, loss of consciousness or convulsions. The review of systems otherwise negative.     GENERAL: This is a very pleasant obese female who is in no acute distress at this time.  HEENT:  The neck is supple no trauma appreciated.  ABDOMEN: soft  EXTREMITIES: No edema; there is. He may involving the anterior legs /shin bilaterally associated with significant scaly appearance.  There is rather pronounced arthritic changes of the knees bilaterally.  BACK: The neck is supple no trauma appreciated.  SKIN: Normal by inspection.    MENTAL STATUS: Alert and oriented. Speech,  language and cognition are generally intact. Judgment and insight normal.   CRANIAL NERVES: Pupils are equal, round and reactive to light and accomodation; extra ocular movements are full, there is no significant nystagmus; visual fields are full; upper and lower facial muscles are normal in strength and symmetric, there is no flattening of the nasolabial folds; tongue is midline; uvula is midline; shoulder elevation is normal.  MOTOR: Normal tone, bulk and strength; no pronator drift.  There is no drift the upper lower extremities.  COORDINATION: Left finger to nose is normal, right finger to nose is normal, No rest tremor; no intention tremor; no postural tremor; no bradykinesia.  REFLEXES: Deep tendon reflexes are symmetrical and normal. Plantar reflexes are flexor on the left and equivocal on the right.   SENSATION: Normal to light touch, temperature, and pain.     NIH stroke scale 0.       PRIMARY NEUROLOGY Assessment and Plan:   1.  Orthostatic tremor             -Last visit, I decreased her clonazepam to 0.25 mg because she was doing so well.  the hospital discontinued it when she went in with mental status change.  Turns out that the patient had actually gone up to 1 mg on her own before the hospital stay, which is likely the source of mental status change.  For some reason, she restarted it after the hospitalization, but she did run out of it about 2 months ago.  I do not recommend we restart it.  She is not even walking anymore,  and has been wheelchair-bound, with the exception of when physical therapy helped her stand.  She really is not tremoring that much when she stands, and I told her that the risks of the medicines outweigh the benefits.  She asked me about other medications, and I really do not recommend that now that she is not walking.  She agrees.  2.  Memory change, likely dementia             -Recommended caregiving and sounds like she has that.  She was actually  quite appropriate in the visit today and was by herself for the visit (not in the home by herself, but just for a phone visit).  If primary care has not already done so, would recommend checking her B12.  Primary care physician is not in our system.  Patient is not in the office today because of the pandemic and we cannot check that today.  Recommend that someone else assist with finances and cooking and monitor medications/distribution of medications.  Patient states that is the case.  Daughter is getting groceries for her.     Blood pressure (!) 146/78, pulse 70, temperature 98.4 F (36.9 C), temperature source Oral, resp. rate 18, height 5\' 9"  (1.753 m), weight 91.9 kg, SpO2 97 %.  Past Medical History:  Diagnosis Date  . Diabetes mellitus without complication ([place])   . Diabetic neuropathy ([place])   . Hypertension   . Stroke ([place])   . Urinary incontinence     Past Surgical History:  Procedure Laterality Date  . ABDOMINAL HYSTERECTOMY    . APPENDECTOMY    . BREAST SURGERY     begnin tumor removed  . CATARACT EXTRACTION, BILATERAL      Family History  Problem Relation Age of Onset  . CAD Mother   . Hypertension Mother   . Stroke Father   . Heart attack Brother   . Cancer Brother     Social History:  reports that she has never smoked. She has never used smokeless tobacco. She reports that she does not drink alcohol or use drugs.  Allergies:  Allergies  Allergen Reactions  . Codeine Nausea And Vomiting    Medications: Prior to Admission medications   Medication Sig Start Date End Date Taking? Authorizing Provider  amLODipine (NORVASC) 5 MG tablet Take 1 tablet (5 mg total) by mouth daily for 30 days. 03/26/19 07/14/19  Manuella Ghazi, Pratik D, DO  Cholecalciferol (VITAMIN D3) 5000 units CAPS Take 1 capsule (5,000 Units total) by mouth daily. Patient taking differently: Take 5,000 Units by mouth every evening.  04/15/17   Nida, Marella Chimes, MD  Cyanocobalamin (B-12 PO) Take 1  tablet by mouth every evening.    [provider]  gabapentin (NEURONTIN) 300 MG capsule Take 1 capsule (300 mg total) by mouth 3 (three) times daily. 12/07/19   Cassandria Anger, MD  glipiZIDE (GLUCOTROL XL) 5 MG 24 hr tablet TAKE ONE TABLET (5 MG TOTAL) BY MOUTH TWO TIMES DAILY WITH A MEAL. 12/15/19   Cassandria Anger, MD  Insulin Glargine (LANTUS) 100 UNIT/ML Solostar Pen Inject 100 Units into the skin 2 (two) times daily at 8 am and 10 pm.    [provider]  JANUVIA 50 MG tablet TAKE 1 TABLET BY MOUTH  DAILY 02/02/20   Cassandria Anger, MD  Magnesium 200 MG TABS Take 1 tablet (200 mg total) by mouth daily. 11/30/19   Cassandria Anger, MD  metFORMIN (GLUCOPHAGE) 500 MG tablet  Take 1 tablet (500 mg total) by mouth 2 (two) times daily after a meal. 09/15/19   Nida, Marella Chimes, MD  metoprolol succinate (TOPROL-XL) 50 MG 24 hr tablet Take 50 mg by mouth at bedtime. Take with or immediately following a meal.     [provider]  MYRBETRIQ 50 MG TB24 tablet Take 50 mg by mouth every evening.  09/02/14   [provider]  nystatin (MYCOSTATIN/NYSTOP) powder Apply topically 2 (two) times daily. Apply to affected skin for treatment of fungal infection. Please keep area clean and dry. 11/30/18   Barton Dubois, MD  [facility] VERIO test strip TEST  3 TIMES DAILY 02/09/20   Cassandria Anger, MD  simvastatin (ZOCOR) 20 MG tablet Take 20 mg by mouth every evening.    [provider]    Scheduled Meds: . [START ON 03/01/2020] aspirin  300 mg Rectal Daily   Or  . [START ON 03/01/2020] aspirin  325 mg Oral Daily  . enoxaparin (LOVENOX) injection  40 mg Subcutaneous Q24H  . gabapentin  300 mg Oral TID  . insulin aspart  0-9 Units Subcutaneous Q4H  . insulin glargine  70 Units Subcutaneous BID AC & HS  . mirabegron ER  50 mg Oral QPM  . simvastatin  20 mg Oral QPM   Continuous Infusions: . sodium chloride     PRN Meds:.acetaminophen **OR**  acetaminophen (TYLENOL) oral liquid 160 mg/5 mL **OR** acetaminophen     Results for orders placed or performed during the hospital encounter of 02/29/20 (from the past 48 hour(s))  Ethanol     Status: None   Collection Time: 02/29/20 12:39 AM  Result Value Ref Range   Alcohol, Ethyl (B) <10 <10 mg/dL    Comment: (NOTE) Lowest detectable limit for serum alcohol is 10 mg/dL. For medical purposes only. Performed at [facility], [address] [place], [place] [address]   Protime-INR     Status: None   Collection Time: 02/29/20 12:39 AM  Result Value Ref Range   Prothrombin Time 12.4 11.4 - 15.2 seconds   INR 0.9 0.8 - 1.2    Comment: (NOTE) INR goal varies based on device and disease states. Performed at [facility], [address] [place] [address], [place], [place] [address]   APTT     Status: None   Collection Time: 02/29/20 12:39 AM  Result Value Ref Range   aPTT 27 24 - 36 seconds    Comment: Performed at [facility], [address] [place], [place] [address]  CBC     Status: None   Collection Time: 02/29/20 12:39 AM  Result Value Ref Range   WBC 8.1 4.0 - 10.5 K/uL   RBC 4.28 3.87 - 5.11 MIL/uL   Hemoglobin 12.4 12.0 - 15.0 g/dL   HCT 38.3 36.0 - 46.0 %   MCV 89.5 80.0 - 100.0 fL   MCH 29.0 26.0 - 34.0 pg   MCHC 32.4 30.0 - 36.0 g/dL   RDW 12.4 11.5 - 15.5 %   Platelets 247 150 - 400 K/uL   nRBC 0.0 0.0 - 0.2 %    Comment: Performed at [facility], [address] [place], [place] [address]  Differential     Status: None   Collection Time: 02/29/20 12:39 AM  Result Value Ref Range   Neutrophils Relative % 58 %   Neutro Abs 4.7 1.7 - 7.7 K/uL   Lymphocytes Relative 26 %   Lymphs Abs 2.1 0.7 - 4.0 K/uL  Monocytes Relative 10 %   Monocytes Absolute 0.8 0.1 - 1.0 K/uL   Eosinophils Relative 5 %   Eosinophils Absolute 0.4 0.0 - 0.5 K/uL   Basophils Relative 1 %   Basophils Absolute 0.1 0.0 - 0.1 K/uL   Immature Granulocytes 0 %   Abs Immature Granulocytes 0.02 0.00 - 0.07  K/uL    Comment: Performed at [facility], [address] [place], [place] [address]  Comprehensive metabolic panel     Status: Abnormal   Collection Time: 02/29/20 12:39 AM  Result Value Ref Range   Sodium 135 135 - 145 mmol/L   Potassium 3.8 3.5 - 5.1 mmol/L   Chloride 101 98 - 111 mmol/L   CO2 25 22 - 32 mmol/L   Glucose, Bld 329 (H) 70 - 99 mg/dL    Comment: Glucose reference range applies only to samples taken after fasting for at least 8 hours.   BUN 17 8 - 23 mg/dL   Creatinine, Ser 0.95 0.44 - 1.00 mg/dL   Calcium 8.8 (L) 8.9 - 10.3 mg/dL   Total Protein 6.6 6.5 - 8.1 g/dL   Albumin 3.3 (L) 3.5 - 5.0 g/dL   AST 16 15 - 41 U/L   ALT 13 0 - 44 U/L   Alkaline Phosphatase 85 38 - 126 U/L   Total Bilirubin 0.5 0.3 - 1.2 mg/dL   GFR calc non Af Amer 53 (L) >60 mL/min   GFR calc Af Amer >60 >60 mL/min   Anion gap 9 5 - 15    Comment: Performed at [facility], [address] [place], [place] [address]  SARS CORONAVIRUS 2 (TAT 6-24 HRS) Nasopharyngeal Nasopharyngeal Swab     Status: None   Collection Time: 02/29/20  2:03 AM   Specimen: Nasopharyngeal Swab  Result Value Ref Range   SARS Coronavirus 2 NEGATIVE NEGATIVE    Comment: (NOTE) SARS-CoV-2 target nucleic acids are NOT DETECTED. The SARS-CoV-2 RNA is generally detectable in upper and lower respiratory specimens during the acute phase of infection. Negative results do not preclude SARS-CoV-2 infection, do not rule out co-infections with other pathogens, and should not be used as the sole basis for treatment or other patient management decisions. Negative results must be combined with clinical observations, patient history, and epidemiological information. The expected result is Negative. Fact Sheet for Patients: SugarRoll.be Fact Sheet for Healthcare Providers: https://www.woods-mathews.com/ This test is not yet approved or cleared by the [place] FDA and  has been  authorized for detection and/or diagnosis of SARS-CoV-2 by FDA under an Emergency Use Authorization (EUA). This EUA will remain  in effect (meaning this test can be used) for the duration of the COVID-19 declaration under Section 56 4(b)(1) of the Act, 21 U.S.C. section 360bbb-3(b)(1), unless the authorization is terminated or revoked sooner. Performed at [facility], [name] [address] [place] [address]   CBC     Status: None   Collection Time: 02/29/20  9:47 AM  Result Value Ref Range   WBC 7.0 4.0 - 10.5 K/uL   RBC 4.48 3.87 - 5.11 MIL/uL   Hemoglobin 13.0 12.0 - 15.0 g/dL   HCT 39.9 36.0 - 46.0 %   MCV 89.1 80.0 - 100.0 fL   MCH 29.0 26.0 - 34.0 pg   MCHC 32.6 30.0 - 36.0 g/dL   RDW 12.5 11.5 - 15.5 %   Platelets 241 150 - 400 K/uL   nRBC 0.0 0.0 - 0.2 %  Comment: Performed at [facility], [address] [place], [place] [address]  Creatinine, serum     Status: None   Collection Time: 02/29/20  9:47 AM  Result Value Ref Range   Creatinine, Ser 0.82 0.44 - 1.00 mg/dL   GFR calc non Af Amer >60 >60 mL/min   GFR calc Af Amer >60 >60 mL/min    Comment: Performed at [facility], [address], [place], [place] [address]  CBG monitoring, ED     Status: Abnormal   Collection Time: 02/29/20 10:41 AM  Result Value Ref Range   Glucose-Capillary 190 (H) 70 - 99 mg/dL    Comment: Glucose reference range applies only to samples taken after fasting for at least 8 hours.  Glucose, capillary     Status: Abnormal   Collection Time: 02/29/20 11:28 AM  Result Value Ref Range   Glucose-Capillary 164 (H) 70 - 99 mg/dL    Comment: Glucose reference range applies only to samples taken after fasting for at least 8 hours.    Studies/Results:  CAROTID DOPPLERS IMPRESSION: Color duplex indicates minimal heterogeneous and calcified plaque, with no hemodynamically significant stenosis by duplex criteria in the extracranial cerebrovascular circulation     HEAD  CT FINDINGS: Brain: Stable cerebral volume. No midline shift, mass effect, or evidence of intracranial mass lesion. No acute intracranial hemorrhage identified. Stable gray-white matter differentiation throughout the brain. Mild for age white matter hypodensity. No cortically based acute infarct identified.  Vascular: Calcified atherosclerosis at the skull base. No suspicious intracranial vascular hyperdensity.  Skull: No acute osseous abnormality identified.  Sinuses/Orbits: Visualized paranasal sinuses and mastoids are stable and well pneumatized.  Other: No acute orbit or scalp soft tissue findings. There is some scalp vessel calcified atherosclerosis.  ASPECTS [facility] Stroke Program Early CT Score)  Total score (0-10 with 10 being normal): 10  IMPRESSION: Stable non contrast CT appearance of the brain since 2020. ASPECTS 10.         Evertt Chouinard A. Merlene Laughter, M.D.  Diplomate, Tax adviser of Psychiatry and Neurology ( Neurology). 02/29/2020, 5:48 PM

## 2020-02-29 NOTE — H&P (Signed)
TRH H&P    Patient Demographics:    [name], is a 86 y.o. female  MRN: [id number]  DOB - 06-17-1929  Admit Date - 02/29/2020  Referring MD/NP/PA: Dr. Tomi Bamberger  Outpatient Primary MD for the patient is Sharilyn Sites, MD  Patient coming from: Home  Chief complaint-dizziness   HPI:    [name]  is a 86 y.o. female, with history of diabetes mellitus type 2, hypertension, stroke, wheelchair-bound, hyperlipidemia, hypertension came to hospital with chief complaint of feeling weak and dizzy.  Patient also felt difficulty speaking as she felt her tongue was swollen.  At this time patient is talking fine denies any discomfort in her tongue.  Denies focal weakness of extremities, no numbness.  She was brought to hospital as EMS felt that she may have had mild left facial droop when she pulled her lips back to show her teeth.  She denies chest pain or shortness of breath. Denies fever or chills. No nausea vomiting or diarrhea. Denies abdominal pain or dysuria.  In the ED, CT head was unremarkable.  Patient was evaluated by tele neurology, and TIA work-up was recommended.    Review of systems:    In addition to the HPI above,    All other systems reviewed and are negative.    Past History of the following :    Past Medical History:  Diagnosis Date  . Diabetes mellitus without complication ([place])   . Diabetic neuropathy ([place])   . Hypertension   . Stroke ([place])   . Urinary incontinence       Past Surgical History:  Procedure Laterality Date  . ABDOMINAL HYSTERECTOMY    . APPENDECTOMY    . BREAST SURGERY     begnin tumor removed  . CATARACT EXTRACTION, BILATERAL        Social History:      Social History   Tobacco Use  . Smoking status: Never Smoker  . Smokeless tobacco: Never Used  Substance Use Topics  . Alcohol use: No       Family History :     Family History    Problem Relation Age of Onset  . CAD Mother   . Hypertension Mother   . Stroke Father   . Heart attack Brother   . Cancer Brother       Home Medications:   Prior to Admission medications   Medication Sig Start Date End Date Taking? Authorizing Provider  amLODipine (NORVASC) 5 MG tablet Take 1 tablet (5 mg total) by mouth daily for 30 days. 03/26/19 07/14/19  Manuella Ghazi, Pratik D, DO  Cholecalciferol (VITAMIN D3) 5000 units CAPS Take 1 capsule (5,000 Units total) by mouth daily. Patient taking differently: Take 5,000 Units by mouth every evening.  04/15/17   Nida, Marella Chimes, MD  Cyanocobalamin (B-12 PO) Take 1 tablet by mouth every evening.    [provider]  gabapentin (NEURONTIN) 300 MG capsule Take 1 capsule (300 mg total) by mouth 3 (three) times daily. 12/07/19   Cassandria Anger, MD  glipiZIDE (GLUCOTROL  XL) 5 MG 24 hr tablet TAKE ONE TABLET (5 MG TOTAL) BY MOUTH TWO TIMES DAILY WITH A MEAL. 12/15/19   Cassandria Anger, MD  Insulin Glargine (LANTUS) 100 UNIT/ML Solostar Pen Inject 100 Units into the skin 2 (two) times daily at 8 am and 10 pm.    [provider]  JANUVIA 50 MG tablet TAKE 1 TABLET BY MOUTH  DAILY 02/02/20   Cassandria Anger, MD  Magnesium 200 MG TABS Take 1 tablet (200 mg total) by mouth daily. 11/30/19   Cassandria Anger, MD  metFORMIN (GLUCOPHAGE) 500 MG tablet Take 1 tablet (500 mg total) by mouth 2 (two) times daily after a meal. 09/15/19   Nida, Marella Chimes, MD  metoprolol succinate (TOPROL-XL) 50 MG 24 hr tablet Take 50 mg by mouth at bedtime. Take with or immediately following a meal.     [provider]  MYRBETRIQ 50 MG TB24 tablet Take 50 mg by mouth every evening.  09/02/14   [provider]  nystatin (MYCOSTATIN/NYSTOP) powder Apply topically 2 (two) times daily. Apply to affected skin for treatment of fungal infection. Please keep area clean and dry. 11/30/18   Barton Dubois, MD  [facility] VERIO test strip  TEST  3 TIMES DAILY 02/09/20   Cassandria Anger, MD  simvastatin (ZOCOR) 20 MG tablet Take 20 mg by mouth every evening.    [provider]     Allergies:     Allergies  Allergen Reactions  . Codeine Nausea And Vomiting     Physical Exam:   Vitals  Blood pressure 128/66, pulse (!) 56, temperature 97.7 F (36.5 C), temperature source Oral, resp. rate (!) 21, height 5\' 9"  (1.753 m), weight 94.8 kg, SpO2 96 %.  1.  General: Appears in no acute distress  2. Psychiatric: Alert, oriented x3, intact insight and judgment  3. Neurologic: Cranial nerves II through XII grossly intact, motor strength 5/5 in all extremities, no focal deficit, sensation is intact bilaterally  4. HEENMT:  Atraumatic normocephalic, extraocular muscles are intact  5. Respiratory : Clear to auscultation bilaterally, no wheezing or crackles auscultated  6. Cardiovascular : S1-S2, regular, no murmur auscultated  7. Gastrointestinal:  Abdomen is soft, nontender, no organomegaly     Data Review:    CBC Recent Labs  Lab 02/29/20 0039  WBC 8.1  HGB 12.4  HCT 38.3  PLT 247  MCV 89.5  MCH 29.0  MCHC 32.4  RDW 12.4  LYMPHSABS 2.1  MONOABS 0.8  EOSABS 0.4  BASOSABS 0.1   ------------------------------------------------------------------------------------------------------------------  Results for orders placed or performed during the hospital encounter of 02/29/20 (from the past 48 hour(s))  Ethanol     Status: None   Collection Time: 02/29/20 12:39 AM  Result Value Ref Range   Alcohol, Ethyl (B) <10 <10 mg/dL    Comment: (NOTE) Lowest detectable limit for serum alcohol is 10 mg/dL. For medical purposes only. Performed at [facility], [address], [place], [place] [address]   Protime-INR     Status: None   Collection Time: 02/29/20 12:39 AM  Result Value Ref Range   Prothrombin Time 12.4 11.4 - 15.2 seconds   INR 0.9 0.8 - 1.2    Comment: (NOTE) INR goal varies based  on device and disease states. Performed at [facility], [address] [place], [place] [address]   APTT     Status: None   Collection Time: 02/29/20 12:39 AM  Result Value Ref Range  aPTT 27 24 - 36 seconds    Comment: Performed at [facility], [address], [place], [place] [address]  CBC     Status: None   Collection Time: 02/29/20 12:39 AM  Result Value Ref Range   WBC 8.1 4.0 - 10.5 K/uL   RBC 4.28 3.87 - 5.11 MIL/uL   Hemoglobin 12.4 12.0 - 15.0 g/dL   HCT 38.3 36.0 - 46.0 %   MCV 89.5 80.0 - 100.0 fL   MCH 29.0 26.0 - 34.0 pg   MCHC 32.4 30.0 - 36.0 g/dL   RDW 12.4 11.5 - 15.5 %   Platelets 247 150 - 400 K/uL   nRBC 0.0 0.0 - 0.2 %    Comment: Performed at [facility], [address] [place], [place] [address]  Differential     Status: None   Collection Time: 02/29/20 12:39 AM  Result Value Ref Range   Neutrophils Relative % 58 %   Neutro Abs 4.7 1.7 - 7.7 K/uL   Lymphocytes Relative 26 %   Lymphs Abs 2.1 0.7 - 4.0 K/uL   Monocytes Relative 10 %   Monocytes Absolute 0.8 0.1 - 1.0 K/uL   Eosinophils Relative 5 %   Eosinophils Absolute 0.4 0.0 - 0.5 K/uL   Basophils Relative 1 %   Basophils Absolute 0.1 0.0 - 0.1 K/uL   Immature Granulocytes 0 %   Abs Immature Granulocytes 0.02 0.00 - 0.07 K/uL    Comment: Performed at [facility], [address] [place], [place] [address]  Comprehensive metabolic panel     Status: Abnormal   Collection Time: 02/29/20 12:39 AM  Result Value Ref Range   Sodium 135 135 - 145 mmol/L   Potassium 3.8 3.5 - 5.1 mmol/L   Chloride 101 98 - 111 mmol/L   CO2 25 22 - 32 mmol/L   Glucose, Bld 329 (H) 70 - 99 mg/dL    Comment: Glucose reference range applies only to samples taken after fasting for at least 8 hours.   BUN 17 8 - 23 mg/dL   Creatinine, Ser 0.95 0.44 - 1.00 mg/dL   Calcium 8.8 (L) 8.9 - 10.3 mg/dL   Total Protein 6.6 6.5 - 8.1 g/dL   Albumin 3.3 (L) 3.5 - 5.0 g/dL   AST 16 15 - 41 U/L   ALT 13 0 - 44 U/L   Alkaline  Phosphatase 85 38 - 126 U/L   Total Bilirubin 0.5 0.3 - 1.2 mg/dL   GFR calc non Af Amer 53 (L) >60 mL/min   GFR calc Af Amer >60 >60 mL/min   Anion gap 9 5 - 15    Comment: Performed at [facility], [address], [place],  [address]    Chemistries  Recent Labs  Lab 02/29/20 0039  NA 135  K 3.8  CL 101  CO2 25  GLUCOSE 329*  BUN 17  CREATININE 0.95  CALCIUM 8.8*  AST 16  ALT 13  ALKPHOS 85  BILITOT 0.5   ------------------------------------------------------------------------------------------------------------------  ------------------------------------------------------------------------------------------------------------------ GFR: Estimated Creatinine Clearance: 50.1 mL/min (by C-G formula based on SCr of 0.95 mg/dL). Liver Function Tests: Recent Labs  Lab 02/29/20 0039  AST 16  ALT 13  ALKPHOS 85  BILITOT 0.5  PROT 6.6  ALBUMIN 3.3*   No results for input(s): LIPASE, AMYLASE in the last 168 hours. No results for input(s): AMMONIA in the last 168 hours. Coagulation Profile: Recent Labs  Lab 02/29/20 0039  INR 0.9   Cardiac Enzymes:  No results for input(s): CKTOTAL, CKMB, CKMBINDEX, TROPONINI in the last 168 hours. BNP (last 3 results) No results for input(s): PROBNP in the last 8760 hours. HbA1C: No results for input(s): HGBA1C in the last 72 hours. CBG: No results for input(s): GLUCAP in the last 168 hours. Lipid Profile: No results for input(s): CHOL, HDL, LDLCALC, TRIG, CHOLHDL, LDLDIRECT in the last 72 hours. Thyroid Function Tests: No results for input(s): TSH, T4TOTAL, FREET4, T3FREE, THYROIDAB in the last 72 hours. Anemia Panel: No results for input(s): VITAMINB12, FOLATE, FERRITIN, TIBC, IRON, RETICCTPCT in the last 72 hours.  --------------------------------------------------------------------------------------------------------------- Urine analysis:    Component Value Date/Time   COLORURINE STRAW (A) 07/14/2019 0038    APPEARANCEUR CLEAR 07/14/2019 0038   LABSPEC 1.026 07/14/2019 0038   PHURINE 7.0 07/14/2019 0038   GLUCOSEU >=500 (A) 07/14/2019 0038   HGBUR NEGATIVE 07/14/2019 0038   BILIRUBINUR NEGATIVE 07/14/2019 0038   KETONESUR 5 (A) 07/14/2019 0038   PROTEINUR NEGATIVE 07/14/2019 0038   UROBILINOGEN 0.2 09/06/2009 0135   NITRITE NEGATIVE 07/14/2019 0038   LEUKOCYTESUR NEGATIVE 07/14/2019 0038      Imaging Results:    CT HEAD CODE STROKE WO CONTRAST  Result Date: 02/29/2020 CLINICAL DATA:  Code stroke. 86 year old female with left facial droop and slurred speech onset 2330 hours. EXAM: CT HEAD WITHOUT CONTRAST TECHNIQUE: Contiguous axial images were obtained from the base of the skull through the vertex without intravenous contrast. COMPARISON:  Head CT 07/13/2019. Brain MRI 03/24/2019. FINDINGS: Brain: Stable cerebral volume. No midline shift, mass effect, or evidence of intracranial mass lesion. No acute intracranial hemorrhage identified. Stable gray-white matter differentiation throughout the brain. Mild for age white matter hypodensity. No cortically based acute infarct identified. Vascular: Calcified atherosclerosis at the skull base. No suspicious intracranial vascular hyperdensity. Skull: No acute osseous abnormality identified. Sinuses/Orbits: Visualized paranasal sinuses and mastoids are stable and well pneumatized. Other: No acute orbit or scalp soft tissue findings. There is some scalp vessel calcified atherosclerosis. ASPECTS [facility] Stroke Program Early CT Score) Total score (0-10 with 10 being normal): 10 IMPRESSION: Stable non contrast CT appearance of the brain since 2020. ASPECTS 10. Study discussed by telephone with Dr. Rolland Porter on 02/29/2020 at 00:35 . Electronically Signed   By: Genevie Ann M.D.   On: 02/29/2020 00:36     Assessment & Plan:    Active Problems:   TIA (transient ischemic attack)   1. TIA-patient presented with symptoms of dizziness,?  Aphasia.  She has previous history  of stroke.  CT head is negative.  Tele neurology evaluated the patient, recommends TIA work-up.  Will initiate TIA/stroke protocol.  Obtain MRI brain, echocardiogram, hemoglobin A1c, lipid profile.  Bedside swallow evaluation, PT/OT.  Aspirin 325 mg daily. 2. Diabetes mellitus type 2-continue Lantus 100 units subcu twice daily, initiate sliding scale insulin with NovoLog.  Check CBG every 4 hours. 3. Hypertension-hold metoprolol, amlodipine for permissive hypertension. 4. Hyperlipidemia-continue Zocor   DVT Prophylaxis-   Lovenox   AM Labs Ordered, also please review Full Orders  Family Communication: Admission, patients condition and plan of care including tests being ordered have been discussed with the patient who indicate understanding and agree with the plan and Code Status.  Code Status: Full code  Admission status: Observation  Time spent in minutes : 60 minutes   Cherell Colvin S Jamahl Lemmons M.D

## 2020-02-29 NOTE — ED Notes (Signed)
Called code stroke pt in ct

## 2020-02-29 NOTE — Care Management Obs Status (Signed)
[place] NOTIFICATION   Patient Details  Name: [name] MRN: [id number] Date of Birth: 1929/08/20   Medicare Observation Status Notification Given:  Yes    Tommy Medal 02/29/2020, 4:02 PM

## 2020-02-29 NOTE — ED Provider Notes (Signed)
[facility] EMERGENCY DEPARTMENT Provider Note   CSN: [id number] Arrival date & time: 02/29/20  0025  An emergency department physician performed an initial assessment on this suspected stroke patient at 0026.Time seen on arrival at 12:24 AM  History Chief Complaint  Patient presents with  . Aphasia    [name] is a 86 y.o. female.  HPI   Patient presents via EMS.  They report patient and her husband noted about 11:30 PM tonight her speech became slurred.  She states she feels like her tongue is swollen or heavy.  She denies any headache, numbness or weakness of her extremities.  She denies eating anything differently or taking any new medications.  EMS felt like she may have had a mild left facial droop when she pulled her lips back to show her teeth, she did not have any asymmetry on smiling.  PCP Sharilyn Sites, MD   Past Medical History:  Diagnosis Date  . Diabetes mellitus without complication ([place])   . Diabetic neuropathy ([place])   . Hypertension   . Stroke ([place])   . Urinary incontinence     Patient Active Problem List   Diagnosis Date Noted  . DKA, type 2 ([place]) 07/14/2019  . Uncontrolled type 2 diabetes mellitus with hyperglycemia, with long-term current use of insulin ([place]) 07/14/2019  . Acute metabolic encephalopathy 99991111  . Cellulitis, leg 07/14/2019  . AMS (altered mental status) 07/14/2019  . Altered mental status 07/13/2019  . Acute encephalopathy 03/24/2019  . Hypotension 02/07/2019  . Slurred speech 11/17/2018  . Diabetic neuropathy ([place]) 11/17/2018  . AKI (acute kidney injury) ([place]) 11/17/2018  . Uncontrolled type 2 diabetes mellitus with hyperglycemia ([place]) 06/11/2018  . Mixed hyperlipidemia 02/20/2016  . Essential hypertension, benign 10/10/2015  . Vitamin D deficiency 10/10/2015  . Class 1 obesity due to excess calories without serious comorbidity with body mass index (BMI) of 31.0 to 31.9 in adult 10/10/2015  . HIP PAIN 01/12/2009  .  SPINAL STENOSIS 01/12/2009  . LOW BACK PAIN 01/12/2009  . SPONDYLOLYSIS 01/12/2009  . SHOULDER PAIN 10/04/2008  . Uncontrolled type 2 diabetes mellitus with complication, without long-term current use of insulin ([place]) 10/01/2008    Past Surgical History:  Procedure Laterality Date  . ABDOMINAL HYSTERECTOMY    . APPENDECTOMY    . BREAST SURGERY     begnin tumor removed  . CATARACT EXTRACTION, BILATERAL       OB History   No obstetric history on file.     Family History  Problem Relation Age of Onset  . CAD Mother   . Hypertension Mother   . Stroke Father   . Heart attack Brother   . Cancer Brother     Social History   Tobacco Use  . Smoking status: Never Smoker  . Smokeless tobacco: Never Used  Substance Use Topics  . Alcohol use: No  . Drug use: No  lives at home Lives with spouse  Home Medications Prior to Admission medications   Medication Sig Start Date End Date Taking? Authorizing Provider  amLODipine (NORVASC) 5 MG tablet Take 1 tablet (5 mg total) by mouth daily for 30 days. 03/26/19 07/14/19  Manuella Ghazi, Pratik D, DO  Cholecalciferol (VITAMIN D3) 5000 units CAPS Take 1 capsule (5,000 Units total) by mouth daily. Patient taking differently: Take 5,000 Units by mouth every evening.  04/15/17   Nida, Marella Chimes, MD  Cyanocobalamin (B-12 PO) Take 1 tablet by mouth every evening.    [provider]  gabapentin (NEURONTIN) 300 MG capsule Take 1 capsule (300 mg total) by mouth 3 (three) times daily. 12/07/19   Cassandria Anger, MD  glipiZIDE (GLUCOTROL XL) 5 MG 24 hr tablet TAKE ONE TABLET (5 MG TOTAL) BY MOUTH TWO TIMES DAILY WITH A MEAL. 12/15/19   Cassandria Anger, MD  Insulin Glargine (LANTUS) 100 UNIT/ML Solostar Pen Inject 100 Units into the skin 2 (two) times daily at 8 am and 10 pm.    [provider]  JANUVIA 50 MG tablet TAKE 1 TABLET BY MOUTH  DAILY 02/02/20   Cassandria Anger, MD  Magnesium 200 MG TABS Take 1 tablet (200 mg  total) by mouth daily. 11/30/19   Cassandria Anger, MD  metFORMIN (GLUCOPHAGE) 500 MG tablet Take 1 tablet (500 mg total) by mouth 2 (two) times daily after a meal. 09/15/19   Nida, Marella Chimes, MD  metoprolol succinate (TOPROL-XL) 50 MG 24 hr tablet Take 50 mg by mouth at bedtime. Take with or immediately following a meal.     [provider]  MYRBETRIQ 50 MG TB24 tablet Take 50 mg by mouth every evening.  09/02/14   [provider]  nystatin (MYCOSTATIN/NYSTOP) powder Apply topically 2 (two) times daily. Apply to affected skin for treatment of fungal infection. Please keep area clean and dry. 11/30/18   Barton Dubois, MD  [facility] VERIO test strip TEST  3 TIMES DAILY 02/09/20   Cassandria Anger, MD  simvastatin (ZOCOR) 20 MG tablet Take 20 mg by mouth every evening.    [provider]    Allergies    Codeine  Review of Systems   Review of Systems  All other systems reviewed and are negative.   Physical Exam Updated Vital Signs BP (!) 110/56   Pulse (!) 56   Temp 97.7 F (36.5 C) (Oral)   Resp 14   Ht 5\' 9"  (1.753 m)   Wt 94.8 kg   SpO2 96%   BMI 30.85 kg/m   Physical Exam Vitals and nursing note reviewed.  Constitutional:      General: She is not in acute distress.    Appearance: Normal appearance. She is well-developed. She is not ill-appearing or toxic-appearing.  HENT:     Head: Normocephalic and atraumatic.     Right Ear: External ear normal.     Left Ear: External ear normal.     Nose: Nose normal. No mucosal edema or rhinorrhea.     Mouth/Throat:     Dentition: No dental abscesses.     Pharynx: No uvula swelling.     Comments: There may be some swelling of the proximal portion of the tongue, the end of the tongue appears normal. Eyes:     Extraocular Movements: Extraocular movements intact.     Conjunctiva/sclera: Conjunctivae normal.     Pupils: Pupils are equal, round, and reactive to light.  Cardiovascular:     Rate and  Rhythm: Normal rate and regular rhythm.     Heart sounds: Normal heart sounds. No murmur. No friction rub. No gallop.   Pulmonary:     Effort: Pulmonary effort is normal. No respiratory distress.     Breath sounds: Normal breath sounds. No wheezing, rhonchi or rales.  Chest:     Chest wall: No tenderness or crepitus.  Abdominal:     General: Bowel sounds are normal. There is no distension.     Palpations: Abdomen is soft.     Tenderness: There is no abdominal  tenderness. There is no guarding or rebound.  Musculoskeletal:        General: No tenderness. Normal range of motion.     Cervical back: Full passive range of motion without pain, normal range of motion and neck supple.     Comments: Moves all extremities well.   Skin:    General: Skin is warm and dry.     Coloration: Skin is not pale.     Findings: No erythema or rash.  Neurological:     General: No focal deficit present.     Mental Status: She is alert and oriented to person, place, and time.     Cranial Nerves: No cranial nerve deficit.     Comments: Patient speech appears normal to me although she states she feels like it still slurred.  She is able to use all extremities normally.  Psychiatric:        Mood and Affect: Mood normal. Mood is not anxious.        Speech: Speech normal.        Behavior: Behavior normal.        Thought Content: Thought content normal.         ED Results / Procedures / Treatments   Labs (all labs ordered are listed, but only abnormal results are displayed) Results for orders placed or performed during the hospital encounter of 02/29/20  Ethanol  Result Value Ref Range   Alcohol, Ethyl (B) <10 <10 mg/dL  Protime-INR  Result Value Ref Range   Prothrombin Time 12.4 11.4 - 15.2 seconds   INR 0.9 0.8 - 1.2  APTT  Result Value Ref Range   aPTT 27 24 - 36 seconds  CBC  Result Value Ref Range   WBC 8.1 4.0 - 10.5 K/uL   RBC 4.28 3.87 - 5.11 MIL/uL   Hemoglobin 12.4 12.0 - 15.0 g/dL    HCT 38.3 36.0 - 46.0 %   MCV 89.5 80.0 - 100.0 fL   MCH 29.0 26.0 - 34.0 pg   MCHC 32.4 30.0 - 36.0 g/dL   RDW 12.4 11.5 - 15.5 %   Platelets 247 150 - 400 K/uL   nRBC 0.0 0.0 - 0.2 %  Differential  Result Value Ref Range   Neutrophils Relative % 58 %   Neutro Abs 4.7 1.7 - 7.7 K/uL   Lymphocytes Relative 26 %   Lymphs Abs 2.1 0.7 - 4.0 K/uL   Monocytes Relative 10 %   Monocytes Absolute 0.8 0.1 - 1.0 K/uL   Eosinophils Relative 5 %   Eosinophils Absolute 0.4 0.0 - 0.5 K/uL   Basophils Relative 1 %   Basophils Absolute 0.1 0.0 - 0.1 K/uL   Immature Granulocytes 0 %   Abs Immature Granulocytes 0.02 0.00 - 0.07 K/uL  Comprehensive metabolic panel  Result Value Ref Range   Sodium 135 135 - 145 mmol/L   Potassium 3.8 3.5 - 5.1 mmol/L   Chloride 101 98 - 111 mmol/L   CO2 25 22 - 32 mmol/L   Glucose, Bld 329 (H) 70 - 99 mg/dL   BUN 17 8 - 23 mg/dL   Creatinine, Ser 0.95 0.44 - 1.00 mg/dL   Calcium 8.8 (L) 8.9 - 10.3 mg/dL   Total Protein 6.6 6.5 - 8.1 g/dL   Albumin 3.3 (L) 3.5 - 5.0 g/dL   AST 16 15 - 41 U/L   ALT 13 0 - 44 U/L   Alkaline Phosphatase 85 38 - 126 U/L  Total Bilirubin 0.5 0.3 - 1.2 mg/dL   GFR calc non Af Amer 53 (L) >60 mL/min   GFR calc Af Amer >60 >60 mL/min   Anion gap 9 5 - 15   Laboratory interpretation all normal except hyperglycemia    EKG EKG Interpretation  Date/Time:  Monday February 29 2020 00:44:25 EST Ventricular Rate:  55 PR Interval:    QRS Duration: 136 QT Interval:  467 QTC Calculation: 447 R Axis:   70 Text Interpretation: Sinus bradycardia Right bundle branch block Minimal ST elevation, inferior leads Since last tracing rate slower 13 Jul 2019 Confirmed by Rolland Porter [phone]) on 02/29/2020 12:55:18 AM   Radiology CT HEAD CODE STROKE WO CONTRAST  Result Date: 02/29/2020 CLINICAL DATA:  Code stroke. 86 year old female with left facial droop and slurred speech onset 2330 hours. EXAM: CT HEAD WITHOUT CONTRAST TECHNIQUE: Contiguous axial  images were obtained from the base of the skull through the vertex without intravenous contrast. COMPARISON:  Head CT 07/13/2019. Brain MRI 03/24/2019. FINDINGS: Brain: Stable cerebral volume. No midline shift, mass effect, or evidence of intracranial mass lesion. No acute intracranial hemorrhage identified. Stable gray-white matter differentiation throughout the brain. Mild for age white matter hypodensity. No cortically based acute infarct identified. Vascular: Calcified atherosclerosis at the skull base. No suspicious intracranial vascular hyperdensity. Skull: No acute osseous abnormality identified. Sinuses/Orbits: Visualized paranasal sinuses and mastoids are stable and well pneumatized. Other: No acute orbit or scalp soft tissue findings. There is some scalp vessel calcified atherosclerosis. ASPECTS [facility] Stroke Program Early CT Score) Total score (0-10 with 10 being normal): 10 IMPRESSION: Stable non contrast CT appearance of the brain since 2020. ASPECTS 10. Study discussed by telephone with Dr. Rolland Porter on 02/29/2020 at 00:35 . Electronically Signed   By: Genevie Ann M.D.   On: 02/29/2020 00:36      Procedures .Critical Care Performed by: Rolland Porter, MD Authorized by: Rolland Porter, MD   Critical care provider statement:    Critical care time (minutes):  34   Critical care was necessary to treat or prevent imminent or life-threatening deterioration of the following conditions:  CNS failure or compromise   Critical care was time spent personally by me on the following activities:  Discussions with consultants, examination of patient, obtaining history from patient or surrogate, ordering and review of radiographic studies and re-evaluation of patient's condition   (including critical care time)  Medications Ordered in ED Medications  aspirin tablet 325 mg (has no administration in time range)    ED Course  I have reviewed the triage vital signs and the nursing notes.  Pertinent labs &  imaging results that were available during my care of the patient were reviewed by me and considered in my medical decision making (see chart for details).    MDM Rules/Calculators/A&P                      Patient was seen on EMS stretcher and was sent directly to CT to get a CT of her head done.  12:35 AM radiology called me her CT report.  1:20 AM patient states she still feels like her tongue is swollen.  Her speech seems normal to me now.  I looked at her tongue and there may be some swelling but it is atypical in that it is the proximal portion of the tongue with the end of the tongue being normal.  Please see pictures.  When I check her medication list  she is not on any ACE inhibitors.  1:22 AM Dr. Sandi Mealy, teleneurology called, he states the patient should be admitted to get a MRI and echocardiogram.  He wants her to get aspirin now.  I 1:58 AM Dr. Darrick Meigs, hospitalist will admit  Final Clinical Impression(s) / ED Diagnoses Final diagnoses:  TIA (transient ischemic attack)    Rx / DC Orders   Plan admission  Rolland Porter, MD, Barbette Or, MD 02/29/20 [phone]

## 2020-02-29 NOTE — TOC Initial Note (Signed)
Transition of Care [facility]) - Initial/Assessment Note    Patient Details  Name: [name] MRN: [id number] Date of Birth: 09/14/1929  Transition of Care [facility]) CM/SW Contact:    Sherie Don, LCSW Phone Number: 02/29/2020, 10:13 AM  Clinical Narrative: Spoke with patient's son, Solash Guist, regarding patient's ED visit and pending admission to the hospital. Discussed family's expectations for patient following discharge and son reported the family would prefer patient be discharged home with [facility] rather than be transferred to a SNF. CSW asked about current DME in the home and son reported patient has a wheelchair and a walker, but prefers to use the wheelchair as patient had a fall several years ago and has poor lower body strength.   CSW spoke with patient about being admitted to the hospital and discussed patient's preferences for post-discharge care. Patient stated she wants to return home with [facility], if possible. Provided choice list to patient over phone and patient reported she believed she previously had [facility] and wants to resume services with that provider. Patient provided verbal permission for [facility] to contact family and [facility] regarding her care and discharge services.                 Expected Discharge Plan: [place] Barriers to Discharge: Continued Medical Work up  Patient Goals and CMS Choice Patient states their goals for this hospitalization and ongoing recovery are:: Discharge home with [facility] CMS Medicare.gov Compare Post Acute Care list provided to:: Patient Choice offered to / list presented to : Patient  Expected Discharge Plan and Services Expected Discharge Plan: [place] Choice: [place] arrangements for the past 2 months: [place]: [place] ([place])    Prior Living Arrangements/Services Living arrangements for the past 2  months: [place] with:: Spouse Patient language and need for interpreter reviewed:: Yes Do you feel safe going back to the place where you live?: Yes      Need for Family Participation in Patient Care: No (Comment) Care giver support system in place?: Yes (comment)   Criminal Activity/Legal Involvement Pertinent to Current Situation/Hospitalization: No - Comment as needed  Activities of Daily Living Home Assistive Devices/Equipment: Wheelchair, Environmental consultant (specify type), Eyeglasses ADL Screening (condition at time of admission) Patient's cognitive ability adequate to safely complete daily activities?: Yes Is the patient deaf or have difficulty hearing?: No Does the patient have difficulty seeing, even when wearing glasses/contacts?: No Does the patient have difficulty concentrating, remembering, or making decisions?: No Patient able to express need for assistance with ADLs?: Yes Does the patient have difficulty dressing or bathing?: No Independently performs ADLs?: Yes (appropriate for developmental age) Does the patient have difficulty walking or climbing stairs?: Yes Weakness of Legs: Both Weakness of Arms/Hands: Both  Permission Sought/Granted Permission sought to share information with : Family Supports, Chartered certified accountant granted to share information with : Yes, Verbal Permission Granted     Permission granted to share info w AGENCY: [place] granted to share info w Relationship: Ezrie Sindelar (husband) [place]: [phone]; [place] Spizzirri (son) [facility]: [phone]     Emotional Assessment   Attitude/Demeanor/Rapport: Engaged Affect (typically observed): Accepting Orientation: : Oriented to Self, Oriented to Place, Oriented to  Time, Oriented to Situation  Psych Involvement: No (comment)  Admission diagnosis:  TIA (transient ischemic attack) [G45.9] Patient Active Problem List   Diagnosis Date Noted  . TIA  (transient ischemic attack) 02/29/2020  . DKA, type 2 ([place]) 07/14/2019  . Uncontrolled type 2 diabetes mellitus with hyperglycemia, with long-term current use of insulin ([place]) 07/14/2019  . Acute metabolic encephalopathy 99991111  . Cellulitis, leg 07/14/2019  . AMS (altered mental status) 07/14/2019  . Altered mental status 07/13/2019  . Acute encephalopathy 03/24/2019  . Hypotension 02/07/2019  . Slurred speech 11/17/2018  . Diabetic neuropathy ([place]) 11/17/2018  . AKI (acute kidney injury) ([place]) 11/17/2018  . Uncontrolled type 2 diabetes mellitus with hyperglycemia ([place]) 06/11/2018  . Mixed hyperlipidemia 02/20/2016  . Essential hypertension, benign 10/10/2015  . Vitamin D deficiency 10/10/2015  . Class 1 obesity due to excess calories without serious comorbidity with body mass index (BMI) of 31.0 to 31.9 in adult 10/10/2015  . HIP PAIN 01/12/2009  . SPINAL STENOSIS 01/12/2009  . LOW BACK PAIN 01/12/2009  . SPONDYLOLYSIS 01/12/2009  . SHOULDER PAIN 10/04/2008  . Uncontrolled type 2 diabetes mellitus with complication, without long-term current use of insulin ([place]) 10/01/2008   PCP:  Sharilyn Sites, MD Pharmacy:   [place], [place] [address] Phone: [phone] Fax: [place], [place] [address] [place] [address] Phone: [phone] Fax: [place] [address] [place] [address] [place] [address] [place] [address] [place] [address] Phone: [phone] Fax: [phone]   Readmission Risk Interventions No flowsheet data found.

## 2020-02-29 NOTE — Consult Note (Signed)
[facility] TeleNeurology Consult Services   Date of Service:   02/29/2020 00:43:19  Impression:     .  G45.9 - Transient cerebral ischemic attack, unspecified  Comments/Sign-Out: p/w slurred speech, L facial droop, noted at 1130 tonight; wheelchair bound at baseline, h/o stroke, DM, HTN; symptoms seem to have resolved at this point; pt remembers feeling "weird" during the episode; CT head NEG for acute lesions, NIHSS 0 at this time  Metrics: Last Known Well: 02/28/2020 23:30:00 [facility] Notification Time: 02/29/2020 00:43:19 Arrival Time: 02/29/2020 00:25:00 Stamp Time: 02/29/2020 00:43:19 Time First Login Attempt: 02/29/2020 00:49:15 Symptoms: slurred speech, L facial droop NIHSS Start Assessment Time: 02/29/2020 00:50:36 Patient is not a candidate for Alteplase/Activase. Patient was not deemed candidate for Alteplase/Activase thrombolytics because of Resolved symptoms (no residual disabling symptoms).  CT head showed no acute hemorrhage or acute core infarct.  Clinical Presentation is not Suggestive of Large Vessel Occlusive Disease  ED Physician notified of diagnostic impression and management plan on 02/29/2020 00:57:32  Our recommendations are outlined below.  Recommendations:     .  Activate Stroke Protocol Admission/Order Set     .  Stroke/Telemetry Floor     .  Neuro Checks     .  Bedside Swallow Eval     .  DVT Prophylaxis     .  IV Fluids, Normal Saline     .  Head of Bed 30 Degrees     .  Euglycemia and Avoid Hyperthermia (PRN Acetaminophen)     .  Antiplatelet Therapy Recommended     .  admit for TIA/stroke evaluation; check swallow; give ASA 325 now; IV fluids with NS; permissive HTN SBP 140-200, dvt prophy is ok; check cbc, bmp, tsh, lipid panel, hga1c, u/a, CXR  Routine Consultation with [facility] for Follow up Care    ------------------------------------------------------------------------------  History of Present  Illness: Patient is a 86 year old Female.  Patient was brought by EMS for symptoms of slurred speech, L facial droop  p/w slurred speech, L facial droop, noted at 1130 tonight; wheelchair bound at baseline, h/o stroke, DM, HTN; symptoms seem to have resolved at this point; pt remembers feeling "weird" during the episode; CT head NEG for acute lesions, NIHSS 0 at this time           Examination: BP(SBP 140-200), Pulse(<120), Blood Glucose(<110) 1A: Level of Consciousness - Alert; keenly responsive + 0 1B: Ask Month and Age - Both Questions Right + 0 1C: Blink Eyes & Squeeze Hands - Performs Both Tasks + 0 2: Test Horizontal Extraocular Movements - Normal + 0 3: Test Visual Fields - No Visual Loss + 0 4: Test Facial Palsy (Use Grimace if Obtunded) - Normal symmetry + 0 5A: Test Left Arm Motor Drift - No Drift for 10 Seconds + 0 5B: Test Right Arm Motor Drift - No Drift for 10 Seconds + 0 6A: Test Left Leg Motor Drift - No Drift for 5 Seconds + 0 6B: Test Right Leg Motor Drift - No Drift for 5 Seconds + 0 7: Test Limb Ataxia (FNF/Heel-Shin) - No Ataxia + 0 8: Test Sensation - Normal; No sensory loss + 0 9: Test Language/Aphasia - Normal; No aphasia + 0 10: Test Dysarthria - Normal + 0 11: Test Extinction/Inattention - No abnormality + 0  NIHSS Score: 0  Pre-Morbid Modified Ranking Scale: 1 Points = No significant disability despite symptoms; able to carry out all usual duties and activities   Patient/Family was informed  the Neurology Consult would occur via TeleHealth consult by way of interactive audio and video telecommunications and consented to receiving care in this manner.   Due to the immediate potential for life-threatening deterioration due to underlying acute neurologic illness, I spent 30 minutes providing critical care. This time includes time for face to face visit via telemedicine, review of medical records, imaging studies and discussion of findings with providers,  the patient and/or family.   Dr Agustin Cree   [facility] [phone]  Case IO:[id number]

## 2020-02-29 NOTE — Progress Notes (Signed)
SLP Cancellation Note  Patient Details Name: SKYLAAR GEISSINGER MRN: [id number] DOB: [birth year]   Cancelled treatment:       Reason Eval/Treat Not Completed: SLP screened, all reported speech changes and other symptoms have resolved. no needs identified, ST will sign off. Thank you for this referral,  Kelyn Ponciano H. Roddie Mc, CCC-SLP Speech Language Pathologist    Wende Bushy 02/29/2020, 3:50 PM

## 2020-02-29 NOTE — Progress Notes (Signed)
ED phone Secretary Nelwyn Salisbury called 00:09  Patient on arrival per ems eta 10-15 mins.   Exam begun: 00:18 Exam ended:  00:23 Radiologist called: 00:23  [name] ed Dr. Rolland Porter  [phone]

## 2020-02-29 NOTE — Progress Notes (Signed)
Patient seen and examined. Admitted after midnight secondary to dizziness and aphasia; symptoms has essentially resolved by the time of evaluation; tele-neurology was consulted and recommended TIA work up. Of note, patient with prior history stroke and multiple risk factors. Hemodynamically stable currently and in no distress. CT head neg for acute intracranial abnormalities, please refer to H&P written by Dr. Darrick Meigs for further info/details on admission.   Plan: -complete TIA work up -follow PT/speech therapy evaluation -continue full dose aspirin for secondary prevention, base on results will discussed with neurology service for any changes needed in her prevention therapy -continue risk factors modifications, allow permissive HTN and monitor on telemetry.  Barton Dubois MD [phone]

## 2020-02-29 NOTE — ED Triage Notes (Addendum)
Pt arrived to er by [facility] with c/o slurred speech that started about 45 minutes prior to arrival in er. bsl with ems was 330

## 2020-02-29 NOTE — ED Notes (Signed)
Ultrasound at bedside at this time completing carotid scans.

## 2020-02-29 NOTE — Progress Notes (Signed)
Inpatient Diabetes Program Recommendations  AACE/ADA: New Consensus Statement on Inpatient Glycemic Control (2015)  Target Ranges:  Prepandial:   less than 140 mg/dL      Peak postprandial:   less than 180 mg/dL (1-2 hours)      Critically ill patients:  140 - 180 mg/dL   Lab Results  Component Value Date   GLUCAP 190 (H) 02/29/2020   HGBA1C 9.2 (A) 12/01/2019    Review of Glycemic Control Results for [name], [name] (MRN [id number]) as of 02/29/2020 10:50  Ref. Range 02/29/2020 10:41  Glucose-Capillary Latest Ref Range: 70 - 99 mg/dL 190 (H)   Diabetes history: DM 2 Outpatient Diabetes medications: Lantus 100 units bid, Januvia 50 mg Daily, Glipizide 5 mg bid, Metformin 500 mg Daily Current orders for Inpatient glycemic control:  Lantus 100 units bid Novolog 0-9 units Q4 hours   Inpatient Diabetes Program Recommendations:    Glucose 190 this am. Consider reducing Lantus dose to 20 units bid to start.  Thanks,  Tama Headings RN, MSN, BC-ADM Inpatient Diabetes Coordinator Team Pager [phone] (8a-5p)

## 2020-03-01 ENCOUNTER — Observation Stay (HOSPITAL_COMMUNITY): Payer: Medicare Other

## 2020-03-01 ENCOUNTER — Observation Stay (HOSPITAL_BASED_OUTPATIENT_CLINIC_OR_DEPARTMENT_OTHER): Payer: Medicare Other

## 2020-03-01 DIAGNOSIS — I6322 Cerebral infarction due to unspecified occlusion or stenosis of basilar arteries: Secondary | ICD-10-CM | POA: Diagnosis not present

## 2020-03-01 DIAGNOSIS — R29818 Other symptoms and signs involving the nervous system: Secondary | ICD-10-CM | POA: Diagnosis not present

## 2020-03-01 DIAGNOSIS — E1165 Type 2 diabetes mellitus with hyperglycemia: Secondary | ICD-10-CM

## 2020-03-01 DIAGNOSIS — I10 Essential (primary) hypertension: Secondary | ICD-10-CM | POA: Diagnosis not present

## 2020-03-01 DIAGNOSIS — I6381 Other cerebral infarction due to occlusion or stenosis of small artery: Secondary | ICD-10-CM

## 2020-03-01 DIAGNOSIS — G459 Transient cerebral ischemic attack, unspecified: Secondary | ICD-10-CM | POA: Diagnosis not present

## 2020-03-01 DIAGNOSIS — E785 Hyperlipidemia, unspecified: Secondary | ICD-10-CM

## 2020-03-01 DIAGNOSIS — I63211 Cerebral infarction due to unspecified occlusion or stenosis of right vertebral arteries: Secondary | ICD-10-CM

## 2020-03-01 LAB — HEMOGLOBIN A1C
Hgb A1c MFr Bld: 8.7 % — ABNORMAL HIGH (ref 4.8–5.6)
Mean Plasma Glucose: 202.99 mg/dL

## 2020-03-01 LAB — GLUCOSE, CAPILLARY
Glucose-Capillary: 128 mg/dL — ABNORMAL HIGH (ref 70–99)
Glucose-Capillary: 205 mg/dL — ABNORMAL HIGH (ref 70–99)
Glucose-Capillary: 88 mg/dL (ref 70–99)
Glucose-Capillary: 94 mg/dL (ref 70–99)

## 2020-03-01 LAB — ECHOCARDIOGRAM COMPLETE
Height: 69 in
Weight: 3241.64 oz

## 2020-03-01 LAB — LIPID PANEL
Cholesterol: 106 mg/dL (ref 0–200)
HDL: 39 mg/dL — ABNORMAL LOW (ref >40)
LDL Cholesterol: 40 mg/dL (ref 0–99)
Total CHOL/HDL Ratio: 2.7 RATIO
Triglycerides: 137 mg/dL (ref <150)
VLDL: 27 mg/dL (ref 0–40)

## 2020-03-01 LAB — C-PEPTIDE: C-Peptide: 3.8 ng/mL (ref 1.1–4.4)

## 2020-03-01 MED ORDER — ASPIRIN 325 MG PO TABS: 325.0000 mg | ORAL_TABLET | Freq: Every day | ORAL | 2 refills | Status: DC

## 2020-03-01 MED ORDER — AMLODIPINE BESYLATE 5 MG PO TABS: 5.0000 mg | ORAL_TABLET | Freq: Every day | ORAL | 1 refills | Status: DC

## 2020-03-01 NOTE — Evaluation (Addendum)
Physical Therapy Evaluation Patient Details Name: [name] MRN: [id number] DOB: 1929-06-20 Today's Date: 03/01/2020   History of Present Illness  [name]  is a 86 y.o. female, with history of diabetes mellitus type 2, hypertension, stroke, wheelchair-bound, hyperlipidemia, hypertension came to hospital with chief complaint of feeling weak and dizzy.  Patient also felt difficulty speaking as she felt her tongue was swollen.  At this time patient is talking fine denies any discomfort in her tongue.  Denies focal weakness of extremities, no numbness.  She was brought to hospital as EMS felt that she may have had mild left facial droop when she pulled her lips back to show her teeth.  She denies chest pain or shortness of breath.    Clinical Impression  Patient functioning at baseline for functional mobility and gait.  Plan:  Patient discharged from physical therapy to care of nursing for out of bed daily as tolerated for length of stay.     Follow Up Recommendations No PT follow up;Supervision for mobility/OOB;Supervision - Intermittent    Equipment Recommendations  None recommended by PT    Recommendations for Other Services       Precautions / Restrictions Precautions Precautions: Fall Restrictions Weight Bearing Restrictions: No      Mobility  Bed Mobility Overal bed mobility: Modified Independent             General bed mobility comments: increased time  Transfers Overall transfer level: Needs assistance Equipment used: None Transfers: Stand Pivot Transfers   Stand pivot transfers: Supervision Squat pivot transfers: Supervision     General transfer comment: increased time, labored movement  Ambulation/Gait Ambulation/Gait assistance: Supervision Gait Distance (Feet): 2 Feet Assistive device: None(leaning on armrests of chair) Gait Pattern/deviations: Decreased step length - right;Decreased step length - left;Decreased stride length Gait  velocity: slow   General Gait Details: limited to 2 steps during stand pivot transfer to chair due baseline weakness and poor standing balance  Stairs            Wheelchair Mobility    Modified Rankin (Stroke Patients Only)       Balance Overall balance assessment: Needs assistance Sitting-balance support: Feet supported;No upper extremity supported Sitting balance-Leahy Scale: Good Sitting balance - Comments: seated at EOB   Standing balance support: During functional activity;Bilateral upper extremity supported Standing balance-Leahy Scale: Fair Standing balance comment: leaning on armrest of chair                             Pertinent Vitals/Pain Pain Assessment: Faces Faces Pain Scale: Hurts little more Pain Location: left lower leg due to possible cellulitis when given pressure to leg Pain Descriptors / Indicators: Sore;Grimacing Pain Intervention(s): Limited activity within patient's tolerance;Monitored during session;Repositioned    Home Living Family/patient expects to be discharged to:: Private residence Living Arrangements: Spouse/significant other Available Help at Discharge: Family;Friend(s);Available 24 hours/day Type of Home: House Home Access: Ramped entrance     Home Layout: Two level;Able to live on main level with bedroom/bathroom;Other (Comment)(Patient states she does not go upstairs) Home Equipment: Walker - 2 wheels;Cane - single point;Wheelchair - manual;Shower seat      Prior Function Level of Independence: Needs assistance   Gait / Transfers Assistance Needed: Mod Independent stand pivot transfers bed/chair/commode  ADL's / Homemaking Assistance Needed: assisted by spouse        Hand Dominance   Dominant Hand: Right    Extremity/Trunk Assessment  Upper Extremity Assessment Upper Extremity Assessment: Defer to OT evaluation    Lower Extremity Assessment Lower Extremity Assessment: Generalized weakness     Cervical / Trunk Assessment Cervical / Trunk Assessment: Normal  Communication   Communication: No difficulties  Cognition Arousal/Alertness: Awake/alert Behavior During Therapy: WFL for tasks assessed/performed Overall Cognitive Status: Within Functional Limits for tasks assessed                                        General Comments      Exercises     Assessment/Plan    PT Assessment Patent does not need any further PT services  PT Problem List         PT Treatment Interventions      PT Goals (Current goals can be found in the Care Plan section)  Acute Rehab PT Goals Patient Stated Goal: return home PT Goal Formulation: With patient Time For Goal Achievement: 03/01/20 Potential to Achieve Goals: Good    Frequency     Barriers to discharge        Co-evaluation PT/OT/SLP Co-Evaluation/Treatment: Yes Reason for Co-Treatment: Complexity of the patient's impairments (multi-system involvement);For patient/therapist safety PT goals addressed during session: Mobility/safety with mobility;Balance OT goals addressed during session: ADL's and self-care       AM-PAC PT "6 Clicks" Mobility  Outcome Measure Help needed turning from your back to your side while in a flat bed without using bedrails?: None Help needed moving from lying on your back to sitting on the side of a flat bed without using bedrails?: A Little(had to use bed rail) Help needed moving to and from a bed to a chair (including a wheelchair)?: None Help needed standing up from a chair using your arms (e.g., wheelchair or bedside chair)?: None Help needed to walk in hospital room?: Total Help needed climbing 3-5 steps with a railing? : Total 6 Click Score: 17    End of Session   Activity Tolerance: Patient tolerated treatment well;Patient limited by fatigue Patient left: in chair;with call bell/phone within reach Nurse Communication: Mobility status PT Visit Diagnosis: Unsteadiness  on feet (R26.81);Other abnormalities of gait and mobility (R26.89);Muscle weakness (generalized) (M62.81)    Time: [id number] PT Time Calculation (min) (ACUTE ONLY): 22 min   Charges:   PT Evaluation $PT Eval Moderate Complexity: 1 Mod PT Treatments $Therapeutic Activity: 8-22 mins        9:15 AM, 03/01/20 Lonell Grandchild, MPT Physical Therapist with [facility] [phone] office [phone] mobile phone

## 2020-03-01 NOTE — Evaluation (Signed)
Occupational Therapy Evaluation Patient Details Name: [name] MRN: [id number] DOB: 1929/06/08 Today's Date: 03/01/2020    History of Present Illness [name]  is a 86 y.o. female, with history of diabetes mellitus type 2, hypertension, stroke, wheelchair-bound, hyperlipidemia, hypertension came to hospital with chief complaint of feeling weak and dizzy.  Patient also felt difficulty speaking as she felt her tongue was swollen.  At this time patient is talking fine denies any discomfort in her tongue.  Denies focal weakness of extremities, no numbness.  She was brought to hospital as EMS felt that she may have had mild left facial droop when she pulled her lips back to show her teeth.  She denies chest pain or shortness of breath.   Clinical Impression   Pt agreeable to OT/PT co-evaluation this am. Pt reports symptoms have improved and she is ready to go home. Pt demonstrating independence in ADLs, performs at wheelchair level at baseline. BUE strength is WFL, coordination and sensation are intact. No further OT services required at this time.     Follow Up Recommendations  No OT follow up    Equipment Recommendations  None recommended by OT       Precautions / Restrictions Precautions Precautions: Fall Restrictions Weight Bearing Restrictions: No      Mobility Bed Mobility Overal bed mobility: Modified Independent                Transfers Overall transfer level: Needs assistance Equipment used: None Transfers: Squat Pivot Transfers     Squat pivot transfers: Supervision              ADL either performed or assessed with clinical judgement   ADL Overall ADL's : Needs assistance/impaired Eating/Feeding: Modified independent;Sitting   Grooming: Wash/dry hands;Modified independent;Sitting               Lower Body Dressing: Modified independent;Sitting/lateral leans Lower Body Dressing Details (indicate cue type and reason): pt donned  socks while seated at EOB Toilet Transfer: Supervision/safety;Squat-pivot Toilet Transfer Details (indicate cue type and reason): simlulated with bed to chair transfer                 Vision Baseline Vision/History: No visual deficits Patient Visual Report: No change from baseline Vision Assessment?: No apparent visual deficits            Pertinent Vitals/Pain Pain Assessment: No/denies pain     Hand Dominance Right   Extremity/Trunk Assessment Upper Extremity Assessment Upper Extremity Assessment: Overall WFL for tasks assessed   Lower Extremity Assessment Lower Extremity Assessment: Defer to PT evaluation   Cervical / Trunk Assessment Cervical / Trunk Assessment: Normal   Communication Communication Communication: No difficulties   Cognition Arousal/Alertness: Awake/alert Behavior During Therapy: WFL for tasks assessed/performed Overall Cognitive Status: Within Functional Limits for tasks assessed                                                Home Living Family/patient expects to be discharged to:: Private residence Living Arrangements: Spouse/significant other Available Help at Discharge: Family;Friend(s);Available 24 hours/day Type of Home: House Home Access: Ramped entrance     Home Layout: Two level;Able to live on main level with bedroom/bathroom;Other (Comment)(Patient states she does not go upstairs)     Bathroom Shower/Tub: Teacher, early years/pre: Standard Bathroom Accessibility: Yes  Home Equipment: [place] - 2 wheels;Cane - single point;Wheelchair - manual;Shower seat          Prior Functioning/Environment Level of Independence: Needs assistance  Gait / Transfers Assistance Needed: Mod Independent stand pivot transfers bed/chair/commode ADL's / Homemaking Assistance Needed: assisted by spouse            OT Problem List: Impaired balance (sitting and/or standing);Decreased activity tolerance                        Co-evaluation PT/OT/SLP Co-Evaluation/Treatment: Yes Reason for Co-Treatment: Complexity of the patient's impairments (multi-system involvement);For patient/therapist safety PT goals addressed during session: Mobility/safety with mobility;Balance OT goals addressed during session: ADL's and self-care      AM-PAC OT "6 Clicks" Daily Activity     Outcome Measure Help from another person eating meals?: None Help from another person taking care of personal grooming?: None Help from another person toileting, which includes using toliet, bedpan, or urinal?: None Help from another person bathing (including washing, rinsing, drying)?: None Help from another person to put on and taking off regular upper body clothing?: None Help from another person to put on and taking off regular lower body clothing?: None 6 Click Score: 24   End of Session    Activity Tolerance: Patient tolerated treatment well Patient left: in chair;with call bell/phone within reach;with chair alarm set  OT Visit Diagnosis: Muscle weakness (generalized) (M62.81)                Time: [id number] OT Time Calculation (min): 14 min Charges:  OT General Charges $OT Visit: 1 Visit OT Evaluation $OT Eval Low Complexity: [place], OTR/L  [phone] 03/01/2020, 9:07 AM

## 2020-03-01 NOTE — Progress Notes (Signed)
*  PRELIMINARY RESULTS* Echocardiogram 2D Echocardiogram has been performed.  [name] 03/01/2020, 10:42 AM

## 2020-03-01 NOTE — TOC Transition Note (Signed)
Transition of Care [facility]) - CM/SW Discharge Note   Patient Details  Name: AMAN NOURSE MRN: [id number] Date of Birth: January 09, 1929  [facility]) CM/SW Contact:  Boneta Lucks, RN Phone Number: 03/01/2020, 2:44 PM   Clinical Narrative:   Patient discharging home today with spouse. Patient requested [place]. Romualdo Bolk accepted the referral for HHPT.    Final next level of care: [place] Barriers to Discharge: Barriers Resolved   Patient Goals and CMS Choice Patient states their goals for this hospitalization and ongoing recovery are:: Discharge home with [facility] CMS Medicare.gov Compare Post Acute Care list provided to:: Patient Choice offered to / list presented to : Patient  Discharge Placement                Patient to be transferred to facility by: family   Patient and family notified of of transfer: 03/01/20  Discharge Plan and Services     Post Acute Care Choice: [place]: PT [place]: [place] ([place]) Date [facility] Agency Contacted: 03/01/20 Time [place]: 1030 Representative spoke with at [place]: Romualdo Bolk

## 2020-03-01 NOTE — Discharge Summary (Signed)
Physician Discharge Summary  [name] [id number] DOB: 20-Sep-1929 DOA: 02/29/2020  PCP: Sharilyn Sites, MD  Admit date: 02/29/2020 Discharge date: 03/01/2020  Time spent: 35 minutes  Recommendations for Outpatient Follow-up:  1. Repeat basic metabolic panel to follow electrolytes and renal function. 2. Reassess blood pressure and further adjust antihypertensive regimen as required. 3. Continue patient follow-up with endocrinologist for further adjustment to hypoglycemic regimen as needed.   Discharge Diagnoses:  Active Problems:   Essential hypertension   Hyperlipidemia   TIA (transient ischemic attack)   Acute arterial ischemic stroke, vertebrobasilar, thalamic, right ([place])   Discharge Condition: Stable and improved.  Patient discharged home with instruction to follow-up with PCP in 10 days and with her neurologist in 4-6 weeks.  CODE STATUS: Full code.  Diet recommendation: Heart healthy and modify carbohydrate diet.  Filed Weights   02/29/20 0045 02/29/20 1107  Weight: 94.8 kg 91.9 kg    History of present illness:  As per H&P written by Dr. Darrick Meigs on 02/29/2020 86 y.o. female, with history of diabetes mellitus type 2, hypertension, stroke, wheelchair-bound, hyperlipidemia, hypertension came to hospital with chief complaint of feeling weak and dizzy.  Patient also felt difficulty speaking as she felt her tongue was swollen.  At this time patient is talking fine denies any discomfort in her tongue.  Denies focal weakness of extremities, no numbness.  She was brought to hospital as EMS felt that she may have had mild left facial droop when she pulled her lips back to show her teeth.  She denies chest pain or shortness of breath. Denies fever or chills. No nausea vomiting or diarrhea. Denies abdominal pain or dysuria.  In the ED, CT head was unremarkable.  Patient was evaluated by tele neurology, and TIA work-up was recommended.  Hospital Course:  1-punctate acute  infarction near right caudate head: -Patient seen by neurology service -Work-up completed and no other source or abnormalities found. -Continue risk factor modifications and will initiate a full dose aspirin for secondary prevention. -Advised to follow heart healthy diet. -Home health PT arranged at discharge.  2-uncontrolled type 2 diabetes -Hemoglobin A1c 8.7 -Continue current hypoglycemic regimen and close follow-up with endocrinologist.  3-hyperlipidemia -LDL 40 -Continue current statins.  4-essential hypertension -Permissive high blood pressure allowed in the acute setting of ischemia -At discharge will resume home antihypertensive regimen and follow her vital signs for further adjustment in the outpatient setting. -Heart healthy diet has been recommended.  5-diabetic neuropathy -Continue Neurontin.   Procedures:  Carotid Dopplers: Demonstrating no hemodynamically significant stenosis by Doppler criteria in the extracranial cerebrovascular circulation. Antegrade vertebral flow appreciated bilaterally.    2D echo: Preserved ejection fraction, grade 1 diastolic dysfunction; no wall motion and normalities.  No cardioembolic source for stroke appreciated.  See below for x-ray reports.  Consultations:  Neurology service  Discharge Exam: Vitals:   03/01/20 1030 03/01/20 1230  BP: (!) 165/67 (!) 155/67  Pulse: 75 74  Resp:  17  Temp:    SpO2: 97% 99%    General: Afebrile, no chest pain, no nausea, no vomiting.  Reports feeling back to her baseline and denies any dysarthria or worsening weakness. Cardiovascular: S1 and S2, no rubs, no gallops, no JVD. Respiratory: Good air movement bilaterally, no wheezing, no crackles; no using accessory muscle. Abdomen: Soft, nontender, nondistended, positive bowel sounds Extremities: No cyanosis, no clubbing.  Discharge Instructions   Discharge Instructions    Diet - low sodium heart healthy   Complete by: As directed  Discharge instructions   Complete by: As directed    Take medications as prescribed Maintain adequate hydration Follow heart healthy/modified carbohydrate diet Arrange follow-up with neurologist in the next 4-6 weeks.     Allergies as of 03/01/2020      Reactions   Codeine Nausea And Vomiting      Medication List    STOP taking these medications   tiZANidine 2 MG tablet Commonly known as: ZANAFLEX   zolpidem 10 MG tablet Commonly known as: AMBIEN     TAKE these medications   amLODipine 5 MG tablet Commonly known as: NORVASC Take 1 tablet (5 mg total) by mouth daily. What changed: Another medication with the same name was removed. Continue taking this medication, and follow the directions you see here.   aspirin 325 MG tablet Take 1 tablet (325 mg total) by mouth daily. Start taking on: March 02, 2020   furosemide 40 MG tablet Commonly known as: LASIX Take 40 mg by mouth daily.   gabapentin 300 MG capsule Commonly known as: Neurontin Take 1 capsule (300 mg total) by mouth 3 (three) times daily.   glipiZIDE 5 MG 24 hr tablet Commonly known as: GLUCOTROL XL TAKE ONE TABLET (5 MG TOTAL) BY MOUTH TWO TIMES DAILY WITH A MEAL.   insulin glargine 100 UNIT/ML Solostar Pen Commonly known as: LANTUS Inject 100 Units into the skin 2 (two) times daily at 8 am and 10 pm.   Januvia 50 MG tablet Generic drug: sitaGLIPtin TAKE 1 TABLET BY MOUTH  DAILY   Magnesium 200 MG Tabs Take 1 tablet (200 mg total) by mouth daily.   metFORMIN 500 MG tablet Commonly known as: GLUCOPHAGE Take 1 tablet (500 mg total) by mouth 2 (two) times daily after a meal.   metoprolol succinate 50 MG 24 hr tablet Commonly known as: TOPROL-XL Take 50 mg by mouth at bedtime. Take with or immediately following a meal.   Myrbetriq 50 MG Tb24 tablet Generic drug: mirabegron ER Take 50 mg by mouth every evening.   nystatin powder Commonly known as: MYCOSTATIN/NYSTOP Apply topically 2 (two) times  daily. Apply to affected skin for treatment of fungal infection. Please keep area clean and dry.   OneTouch Verio test strip Generic drug: glucose blood TEST  3 TIMES DAILY   simvastatin 20 MG tablet Commonly known as: ZOCOR Take 20 mg by mouth every evening.   Vitamin D3 125 MCG (5000 UT) Caps Take 1 capsule (5,000 Units total) by mouth daily. What changed: when to take this      Allergies  Allergen Reactions  . Codeine Nausea And Vomiting   Follow-up Information    Sharilyn Sites, MD. Schedule an appointment as soon as possible for a visit in 10 day(s).   Specialty: Family Medicine Contact information: [address] [place] [id number] [phone]           The results of significant diagnostics from this hospitalization (including imaging, microbiology, ancillary and laboratory) are listed below for reference.    Significant Diagnostic Studies: DG Chest 2 View  Result Date: 02/29/2020 CLINICAL DATA:  Generalized weakness, dizziness. EXAM: CHEST - 2 VIEW COMPARISON:  07/13/2019 FINDINGS: Borderline cardiomegaly. No confluent opacities, effusions or edema. No acute bony abnormality. IMPRESSION: Borderline heart size.  No acute cardiopulmonary disease. Electronically Signed   By: Rolm Baptise M.D.   On: 02/29/2020 08:58   MR ANGIO HEAD WO CONTRAST  Result Date: 03/01/2020 CLINICAL DATA:  Altered mental status, code stroke follow-up EXAM:  MRI HEAD WITHOUT CONTRAST MRA HEAD WITHOUT CONTRAST TECHNIQUE: Multiplanar, multiecho pulse sequences of the brain and surrounding structures were obtained without intravenous contrast. Angiographic images of the head were obtained using MRA technique without contrast. COMPARISON:  MRI brain 03/24/2019, MRA head 2019 FINDINGS: MRI HEAD Brain: There is a punctate focus mildly reduced diffusion near the right caudate head. There is no intracranial hemorrhage. There is no intracranial mass, mass effect, or edema. There is no  hydrocephalus or extra-axial fluid collection. Prominence of the ventricles and sulci reflects generalized parenchymal volume loss similar to the prior study. Patchy and confluent areas of T2 hyperintensity in the supratentorial white matter are nonspecific but likely reflect similar mild to moderate chronic microvascular ischemic changes. Vascular: Major vessel flow voids at the skull base are preserved. Skull and upper cervical spine: Normal marrow signal is preserved. Sinuses/Orbits: Paranasal sinuses are aerated. Orbits are unremarkable. Other: Sella is unremarkable.  Mastoid air cells are clear. MRA HEAD Intracranial internal carotid arteries are patent. Middle and anterior cerebral arteries are patent. Intracranial vertebral arteries, basilar artery, posterior cerebral arteries are patent. Right posterior communicating artery is present. There is no significant stenosis or aneurysm. IMPRESSION: Punctate acute infarction near the right caudate head. Stable chronic microvascular ischemic changes. No proximal intracranial vessel occlusion or significant stenosis. Electronically Signed   By: Macy Mis M.D.   On: 03/01/2020 09:56   MR BRAIN WO CONTRAST  Result Date: 03/01/2020 CLINICAL DATA:  Altered mental status, code stroke follow-up EXAM: MRI HEAD WITHOUT CONTRAST MRA HEAD WITHOUT CONTRAST TECHNIQUE: Multiplanar, multiecho pulse sequences of the brain and surrounding structures were obtained without intravenous contrast. Angiographic images of the head were obtained using MRA technique without contrast. COMPARISON:  MRI brain 03/24/2019, MRA head 2019 FINDINGS: MRI HEAD Brain: There is a punctate focus mildly reduced diffusion near the right caudate head. There is no intracranial hemorrhage. There is no intracranial mass, mass effect, or edema. There is no hydrocephalus or extra-axial fluid collection. Prominence of the ventricles and sulci reflects generalized parenchymal volume loss similar to the  prior study. Patchy and confluent areas of T2 hyperintensity in the supratentorial white matter are nonspecific but likely reflect similar mild to moderate chronic microvascular ischemic changes. Vascular: Major vessel flow voids at the skull base are preserved. Skull and upper cervical spine: Normal marrow signal is preserved. Sinuses/Orbits: Paranasal sinuses are aerated. Orbits are unremarkable. Other: Sella is unremarkable.  Mastoid air cells are clear. MRA HEAD Intracranial internal carotid arteries are patent. Middle and anterior cerebral arteries are patent. Intracranial vertebral arteries, basilar artery, posterior cerebral arteries are patent. Right posterior communicating artery is present. There is no significant stenosis or aneurysm. IMPRESSION: Punctate acute infarction near the right caudate head. Stable chronic microvascular ischemic changes. No proximal intracranial vessel occlusion or significant stenosis. Electronically Signed   By: Macy Mis M.D.   On: 03/01/2020 09:56   US Carotid Bilateral (at [facility] and [facility] only)  Result Date: 02/29/2020 CLINICAL DATA:  86 year old female with a history of slurred speech and left facial droop EXAM: BILATERAL CAROTID DUPLEX ULTRASOUND TECHNIQUE: Pearline Cables scale imaging, color Doppler and duplex ultrasound were performed of bilateral carotid and vertebral arteries in the neck. COMPARISON:  None. FINDINGS: Criteria: Quantification of carotid stenosis is based on velocity parameters that correlate the residual internal carotid diameter with NASCET-based stenosis levels, using the diameter of the distal internal carotid lumen as the denominator for stenosis measurement. The following velocity measurements were obtained: RIGHT ICA:  Systolic  92 cm/sec, Diastolic 14 cm/sec CCA:  89 cm/sec SYSTOLIC ICA/CCA RATIO:  1.0 ECA:  116 cm/sec LEFT ICA:  Systolic 123XX123 cm/sec, Diastolic 20 cm/sec CCA:  [id number] cm/sec SYSTOLIC ICA/CCA RATIO:  1.1 ECA:  109 cm/sec Right Brachial SBP:  Not acquired Left Brachial SBP: Not acquired RIGHT CAROTID ARTERY: No significant calcifications of the right common carotid artery. Intermediate waveform maintained. Heterogeneous and partially calcified plaque at the right carotid bifurcation. No significant lumen shadowing. Low resistance waveform of the right ICA. No significant tortuosity. RIGHT VERTEBRAL ARTERY: Antegrade flow with low resistance waveform. LEFT CAROTID ARTERY: No significant calcifications of the left common carotid artery. Intermediate waveform maintained. Heterogeneous and partially calcified plaque at the left carotid bifurcation without significant lumen shadowing. Low resistance waveform of the left ICA. No significant tortuosity. LEFT VERTEBRAL ARTERY:  Antegrade flow with low resistance waveform. IMPRESSION: Color duplex indicates minimal heterogeneous and calcified plaque, with no hemodynamically significant stenosis by duplex criteria in the extracranial cerebrovascular circulation. Signed, Dulcy Fanny. Dellia Nims, RPVI [facility] Radiology Electronically Signed   By: Corrie Mckusick D.O.   On: 02/29/2020 12:49   ECHOCARDIOGRAM COMPLETE  Result Date: 03/01/2020    ECHOCARDIOGRAM REPORT   Patient Name:   DONAH BIERNAT Date of Exam: 03/01/2020 Medical Rec #:  [id number]           Height:       69.0 in Accession #:    [id number]          Weight:       202.6 lb Date of Birth:  23-Mar-1929          BSA:          2.077 m Patient Age:    86 years            BP:           173/69 mmHg Patient Gender: F                   HR:           79 bpm. Exam Location:  [facility] Procedure: 2D Echo, Cardiac Doppler and Color Doppler Indications:    TIA 435.9 / G45.9  History:        Patient has prior history of Echocardiogram examinations, most                 recent 11/17/2018. Risk Factors:Hypertension, Diabetes and                 Dyslipidemia.  Sonographer:    Alvino Chapel RCS Referring Phys: [place]  1. Left ventricular ejection fraction, by estimation, is 65 to 70%. The left ventricle has normal function. The left ventricle has no regional wall motion abnormalities. There is mild left ventricular hypertrophy. Left ventricular diastolic parameters are consistent with Grade I diastolic dysfunction (impaired relaxation).  2. Right ventricular systolic function is normal. The right ventricular size is normal. Tricuspid regurgitation signal is inadequate for assessing PA pressure.  3. The mitral valve is grossly normal. Trivial mitral valve regurgitation.  4. The aortic valve is tricuspid. Aortic valve regurgitation is not visualized. Mild aortic valve sclerosis is present, with no evidence of aortic valve stenosis.  5. The inferior vena cava is normal in size with greater than 50% respiratory variability, suggesting right atrial pressure of 3 mmHg. FINDINGS  Left Ventricle: Left ventricular ejection fraction, by estimation, is 65 to 70%. The  left ventricle has normal function. The left ventricle has no regional wall motion abnormalities. The left ventricular internal cavity size was normal in size. There is  mild left ventricular hypertrophy. Left ventricular diastolic parameters are consistent with Grade I diastolic dysfunction (impaired relaxation). Right Ventricle: The right ventricular size is normal. No increase in right ventricular wall thickness. Right ventricular systolic function is normal. Tricuspid regurgitation signal is inadequate for assessing PA pressure. Left Atrium: Left atrial size was normal in size. Right Atrium: Right atrial size was normal in size. Pericardium: There is no evidence of pericardial effusion. Presence of pericardial fat pad. Mitral Valve: The mitral valve is grossly normal. Mild mitral annular calcification. Trivial mitral valve regurgitation. Tricuspid Valve: The tricuspid valve is grossly normal. Tricuspid valve regurgitation is trivial. Aortic Valve:  The aortic valve is tricuspid. Aortic valve regurgitation is not visualized. Mild aortic valve sclerosis is present, with no evidence of aortic valve stenosis. Mild aortic valve annular calcification. Pulmonic Valve: The pulmonic valve was grossly normal. Pulmonic valve regurgitation is not visualized. Aorta: The aortic root is normal in size and structure. Venous: The inferior vena cava is normal in size with greater than 50% respiratory variability, suggesting right atrial pressure of 3 mmHg. IAS/Shunts: The interatrial septum appears to be lipomatous. No atrial level shunt detected by color flow Doppler.  LEFT VENTRICLE PLAX 2D LVIDd:         4.56 cm  Diastology LVIDs:         2.53 cm  LV e' lateral:   7.07 cm/s LV PW:         1.00 cm  LV E/e' lateral: 13.0 LV IVS:        1.13 cm  LV e' medial:    5.77 cm/s LVOT diam:     2.00 cm  LV E/e' medial:  15.9 LV SV:         98 LV SV Index:   47 LVOT Area:     3.14 cm  RIGHT VENTRICLE RV S prime:     14.50 cm/s TAPSE (M-mode): 2.0 cm LEFT ATRIUM             Index       RIGHT ATRIUM           Index LA diam:        3.50 cm 1.68 cm/m  RA Area:     12.70 cm LA Vol (A2C):   41.8 ml 20.12 ml/m RA Volume:   27.20 ml  13.09 ml/m LA Vol (A4C):   44.6 ml 21.47 ml/m LA Biplane Vol: 44.2 ml 21.28 ml/m  AORTIC VALVE LVOT Vmax:   143.00 cm/s LVOT Vmean:  105.000 cm/s LVOT VTI:    0.312 m  AORTA Ao Root diam: 2.70 cm MITRAL VALVE MV Area (PHT): 1.98 cm     SHUNTS MV Decel Time: 384 msec     Systemic VTI:  0.31 m MV E velocity: 91.70 cm/s   Systemic Diam: 2.00 cm MV A velocity: 139.00 cm/s MV E/A ratio:  0.66 Rozann Lesches MD Electronically signed by Rozann Lesches MD Signature Date/Time: 03/01/2020/12:26:29 PM    Final    CT HEAD CODE STROKE WO CONTRAST  Result Date: 02/29/2020 CLINICAL DATA:  Code stroke. 86 year old female with left facial droop and slurred speech onset 2330 hours. EXAM: CT HEAD WITHOUT CONTRAST TECHNIQUE: Contiguous axial images were obtained from the  base of the skull through the vertex without intravenous contrast. COMPARISON:  Head CT 07/13/2019. Brain  MRI 03/24/2019. FINDINGS: Brain: Stable cerebral volume. No midline shift, mass effect, or evidence of intracranial mass lesion. No acute intracranial hemorrhage identified. Stable gray-white matter differentiation throughout the brain. Mild for age white matter hypodensity. No cortically based acute infarct identified. Vascular: Calcified atherosclerosis at the skull base. No suspicious intracranial vascular hyperdensity. Skull: No acute osseous abnormality identified. Sinuses/Orbits: Visualized paranasal sinuses and mastoids are stable and well pneumatized. Other: No acute orbit or scalp soft tissue findings. There is some scalp vessel calcified atherosclerosis. ASPECTS [facility] Stroke Program Early CT Score) Total score (0-10 with 10 being normal): 10 IMPRESSION: Stable non contrast CT appearance of the brain since 2020. ASPECTS 10. Study discussed by telephone with Dr. Rolland Porter on 02/29/2020 at 00:35 . Electronically Signed   By: Genevie Ann M.D.   On: 02/29/2020 00:36    Microbiology: Recent Results (from the past 240 hour(s))  SARS CORONAVIRUS 2 (TAT 6-24 HRS) Nasopharyngeal Nasopharyngeal Swab     Status: None   Collection Time: 02/29/20  2:03 AM   Specimen: Nasopharyngeal Swab  Result Value Ref Range Status   SARS Coronavirus 2 NEGATIVE NEGATIVE Final    Comment: (NOTE) SARS-CoV-2 target nucleic acids are NOT DETECTED. The SARS-CoV-2 RNA is generally detectable in upper and lower respiratory specimens during the acute phase of infection. Negative results do not preclude SARS-CoV-2 infection, do not rule out co-infections with other pathogens, and should not be used as the sole basis for treatment or other patient management decisions. Negative results must be combined with clinical observations, patient history, and epidemiological information. The expected result is Negative. Fact Sheet  for Patients: SugarRoll.be Fact Sheet for Healthcare Providers: https://www.woods-mathews.com/ This test is not yet approved or cleared by the [place] FDA and  has been authorized for detection and/or diagnosis of SARS-CoV-2 by FDA under an Emergency Use Authorization (EUA). This EUA will remain  in effect (meaning this test can be used) for the duration of the COVID-19 declaration under Section 56 4(b)(1) of the Act, 21 U.S.C. section 360bbb-3(b)(1), unless the authorization is terminated or revoked sooner. Performed at [facility], [place] [address], [place], [place] [address]      Labs: Basic Metabolic Panel: Recent Labs  Lab 02/29/20 0039 02/29/20 0947  NA 135  --   K 3.8  --   CL 101  --   CO2 25  --   GLUCOSE 329*  --   BUN 17  --   CREATININE 0.95 0.82  CALCIUM 8.8*  --    Liver Function Tests: Recent Labs  Lab 02/29/20 0039  AST 16  ALT 13  ALKPHOS 85  BILITOT 0.5  PROT 6.6  ALBUMIN 3.3*   CBC: Recent Labs  Lab 02/29/20 0039 02/29/20 0947  WBC 8.1 7.0  NEUTROABS 4.7  --   HGB 12.4 13.0  HCT 38.3 39.9  MCV 89.5 89.1  PLT 247 241    CBG: Recent Labs  Lab 02/29/20 2101 03/01/20 0056 03/01/20 0405 03/01/20 0724 03/01/20 1102  GLUCAP 185* 128* 94 88 205*    Signed:  Barton Dubois MD.  [facility] 03/01/2020, 2:32 PM

## 2020-03-02 DIAGNOSIS — G459 Transient cerebral ischemic attack, unspecified: Secondary | ICD-10-CM | POA: Diagnosis not present

## 2020-03-02 DIAGNOSIS — Z8673 Personal history of transient ischemic attack (TIA), and cerebral infarction without residual deficits: Secondary | ICD-10-CM | POA: Diagnosis not present

## 2020-03-02 DIAGNOSIS — E785 Hyperlipidemia, unspecified: Secondary | ICD-10-CM | POA: Diagnosis not present

## 2020-03-02 DIAGNOSIS — Z7982 Long term (current) use of aspirin: Secondary | ICD-10-CM | POA: Diagnosis not present

## 2020-03-02 DIAGNOSIS — E1140 Type 2 diabetes mellitus with diabetic neuropathy, unspecified: Secondary | ICD-10-CM | POA: Diagnosis not present

## 2020-03-02 DIAGNOSIS — Z794 Long term (current) use of insulin: Secondary | ICD-10-CM | POA: Diagnosis not present

## 2020-03-02 DIAGNOSIS — R32 Unspecified urinary incontinence: Secondary | ICD-10-CM | POA: Diagnosis not present

## 2020-03-02 DIAGNOSIS — E1165 Type 2 diabetes mellitus with hyperglycemia: Secondary | ICD-10-CM | POA: Diagnosis not present

## 2020-03-02 DIAGNOSIS — I10 Essential (primary) hypertension: Secondary | ICD-10-CM | POA: Diagnosis not present

## 2020-03-07 ENCOUNTER — Ambulatory Visit (HOSPITAL_COMMUNITY)
Admission: RE | Admit: 2020-03-07 | Discharge: 2020-03-07 | Disposition: A | Discharge status: Home / Self Care | Payer: Medicare Other | Source: Ambulatory Visit | Attending: Family Medicine | Admitting: Family Medicine

## 2020-03-07 ENCOUNTER — Other Ambulatory Visit: Payer: Self-pay

## 2020-03-07 ENCOUNTER — Other Ambulatory Visit (HOSPITAL_COMMUNITY): Payer: Self-pay | Admitting: Family Medicine

## 2020-03-07 DIAGNOSIS — M7989 Other specified soft tissue disorders: Secondary | ICD-10-CM | POA: Diagnosis not present

## 2020-03-07 DIAGNOSIS — M25552 Pain in left hip: Secondary | ICD-10-CM | POA: Diagnosis not present

## 2020-03-07 DIAGNOSIS — I10 Essential (primary) hypertension: Secondary | ICD-10-CM | POA: Diagnosis not present

## 2020-03-07 DIAGNOSIS — Z6834 Body mass index (BMI) 34.0-34.9, adult: Secondary | ICD-10-CM | POA: Diagnosis not present

## 2020-03-07 DIAGNOSIS — I639 Cerebral infarction, unspecified: Secondary | ICD-10-CM | POA: Diagnosis not present

## 2020-03-07 DIAGNOSIS — R600 Localized edema: Secondary | ICD-10-CM

## 2020-03-09 ENCOUNTER — Ambulatory Visit: Payer: Medicare Other | Admitting: Orthopedic Surgery

## 2020-03-14 DIAGNOSIS — I10 Essential (primary) hypertension: Secondary | ICD-10-CM | POA: Diagnosis not present

## 2020-03-14 DIAGNOSIS — E1165 Type 2 diabetes mellitus with hyperglycemia: Secondary | ICD-10-CM | POA: Diagnosis not present

## 2020-03-14 DIAGNOSIS — G459 Transient cerebral ischemic attack, unspecified: Secondary | ICD-10-CM | POA: Diagnosis not present

## 2020-03-14 DIAGNOSIS — E785 Hyperlipidemia, unspecified: Secondary | ICD-10-CM | POA: Diagnosis not present

## 2020-03-14 DIAGNOSIS — R32 Unspecified urinary incontinence: Secondary | ICD-10-CM | POA: Diagnosis not present

## 2020-03-14 DIAGNOSIS — E1140 Type 2 diabetes mellitus with diabetic neuropathy, unspecified: Secondary | ICD-10-CM | POA: Diagnosis not present

## 2020-03-17 DIAGNOSIS — I10 Essential (primary) hypertension: Secondary | ICD-10-CM | POA: Diagnosis not present

## 2020-03-17 DIAGNOSIS — E1165 Type 2 diabetes mellitus with hyperglycemia: Secondary | ICD-10-CM | POA: Diagnosis not present

## 2020-03-17 DIAGNOSIS — E1140 Type 2 diabetes mellitus with diabetic neuropathy, unspecified: Secondary | ICD-10-CM | POA: Diagnosis not present

## 2020-03-17 DIAGNOSIS — E785 Hyperlipidemia, unspecified: Secondary | ICD-10-CM | POA: Diagnosis not present

## 2020-03-17 DIAGNOSIS — R32 Unspecified urinary incontinence: Secondary | ICD-10-CM | POA: Diagnosis not present

## 2020-03-17 DIAGNOSIS — G459 Transient cerebral ischemic attack, unspecified: Secondary | ICD-10-CM | POA: Diagnosis not present

## 2020-03-21 ENCOUNTER — Other Ambulatory Visit: Payer: Self-pay | Admitting: "Endocrinology

## 2020-03-21 DIAGNOSIS — I10 Essential (primary) hypertension: Secondary | ICD-10-CM | POA: Diagnosis not present

## 2020-03-21 DIAGNOSIS — R32 Unspecified urinary incontinence: Secondary | ICD-10-CM | POA: Diagnosis not present

## 2020-03-21 DIAGNOSIS — E1165 Type 2 diabetes mellitus with hyperglycemia: Secondary | ICD-10-CM | POA: Diagnosis not present

## 2020-03-21 DIAGNOSIS — G459 Transient cerebral ischemic attack, unspecified: Secondary | ICD-10-CM | POA: Diagnosis not present

## 2020-03-21 DIAGNOSIS — E1140 Type 2 diabetes mellitus with diabetic neuropathy, unspecified: Secondary | ICD-10-CM | POA: Diagnosis not present

## 2020-03-21 DIAGNOSIS — E785 Hyperlipidemia, unspecified: Secondary | ICD-10-CM | POA: Diagnosis not present

## 2020-03-23 DIAGNOSIS — R32 Unspecified urinary incontinence: Secondary | ICD-10-CM | POA: Diagnosis not present

## 2020-03-23 DIAGNOSIS — E1140 Type 2 diabetes mellitus with diabetic neuropathy, unspecified: Secondary | ICD-10-CM | POA: Diagnosis not present

## 2020-03-23 DIAGNOSIS — G459 Transient cerebral ischemic attack, unspecified: Secondary | ICD-10-CM | POA: Diagnosis not present

## 2020-03-23 DIAGNOSIS — E785 Hyperlipidemia, unspecified: Secondary | ICD-10-CM | POA: Diagnosis not present

## 2020-03-23 DIAGNOSIS — E1165 Type 2 diabetes mellitus with hyperglycemia: Secondary | ICD-10-CM | POA: Diagnosis not present

## 2020-03-23 DIAGNOSIS — I10 Essential (primary) hypertension: Secondary | ICD-10-CM | POA: Diagnosis not present

## 2020-03-28 DIAGNOSIS — E785 Hyperlipidemia, unspecified: Secondary | ICD-10-CM | POA: Diagnosis not present

## 2020-03-28 DIAGNOSIS — R32 Unspecified urinary incontinence: Secondary | ICD-10-CM | POA: Diagnosis not present

## 2020-03-28 DIAGNOSIS — I10 Essential (primary) hypertension: Secondary | ICD-10-CM | POA: Diagnosis not present

## 2020-03-28 DIAGNOSIS — G459 Transient cerebral ischemic attack, unspecified: Secondary | ICD-10-CM | POA: Diagnosis not present

## 2020-03-28 DIAGNOSIS — E1140 Type 2 diabetes mellitus with diabetic neuropathy, unspecified: Secondary | ICD-10-CM | POA: Diagnosis not present

## 2020-03-28 DIAGNOSIS — E1165 Type 2 diabetes mellitus with hyperglycemia: Secondary | ICD-10-CM | POA: Diagnosis not present

## 2020-03-29 DIAGNOSIS — R32 Unspecified urinary incontinence: Secondary | ICD-10-CM | POA: Diagnosis not present

## 2020-03-29 DIAGNOSIS — E1165 Type 2 diabetes mellitus with hyperglycemia: Secondary | ICD-10-CM | POA: Diagnosis not present

## 2020-03-29 DIAGNOSIS — E785 Hyperlipidemia, unspecified: Secondary | ICD-10-CM | POA: Diagnosis not present

## 2020-03-29 DIAGNOSIS — E1140 Type 2 diabetes mellitus with diabetic neuropathy, unspecified: Secondary | ICD-10-CM | POA: Diagnosis not present

## 2020-03-29 DIAGNOSIS — G459 Transient cerebral ischemic attack, unspecified: Secondary | ICD-10-CM | POA: Diagnosis not present

## 2020-03-29 DIAGNOSIS — I10 Essential (primary) hypertension: Secondary | ICD-10-CM | POA: Diagnosis not present

## 2020-03-30 DIAGNOSIS — G459 Transient cerebral ischemic attack, unspecified: Secondary | ICD-10-CM | POA: Diagnosis not present

## 2020-03-30 DIAGNOSIS — I10 Essential (primary) hypertension: Secondary | ICD-10-CM | POA: Diagnosis not present

## 2020-03-30 DIAGNOSIS — R32 Unspecified urinary incontinence: Secondary | ICD-10-CM | POA: Diagnosis not present

## 2020-03-30 DIAGNOSIS — E1140 Type 2 diabetes mellitus with diabetic neuropathy, unspecified: Secondary | ICD-10-CM | POA: Diagnosis not present

## 2020-03-30 DIAGNOSIS — E1165 Type 2 diabetes mellitus with hyperglycemia: Secondary | ICD-10-CM | POA: Diagnosis not present

## 2020-03-30 DIAGNOSIS — E785 Hyperlipidemia, unspecified: Secondary | ICD-10-CM | POA: Diagnosis not present

## 2020-04-01 DIAGNOSIS — I10 Essential (primary) hypertension: Secondary | ICD-10-CM | POA: Diagnosis not present

## 2020-04-01 DIAGNOSIS — G459 Transient cerebral ischemic attack, unspecified: Secondary | ICD-10-CM | POA: Diagnosis not present

## 2020-04-01 DIAGNOSIS — E785 Hyperlipidemia, unspecified: Secondary | ICD-10-CM | POA: Diagnosis not present

## 2020-04-01 DIAGNOSIS — Z794 Long term (current) use of insulin: Secondary | ICD-10-CM | POA: Diagnosis not present

## 2020-04-01 DIAGNOSIS — R32 Unspecified urinary incontinence: Secondary | ICD-10-CM | POA: Diagnosis not present

## 2020-04-01 DIAGNOSIS — E1165 Type 2 diabetes mellitus with hyperglycemia: Secondary | ICD-10-CM | POA: Diagnosis not present

## 2020-04-01 DIAGNOSIS — E1140 Type 2 diabetes mellitus with diabetic neuropathy, unspecified: Secondary | ICD-10-CM | POA: Diagnosis not present

## 2020-04-01 DIAGNOSIS — Z8673 Personal history of transient ischemic attack (TIA), and cerebral infarction without residual deficits: Secondary | ICD-10-CM | POA: Diagnosis not present

## 2020-04-01 DIAGNOSIS — Z7982 Long term (current) use of aspirin: Secondary | ICD-10-CM | POA: Diagnosis not present

## 2020-04-04 ENCOUNTER — Other Ambulatory Visit: Payer: Self-pay | Admitting: "Endocrinology

## 2020-04-05 ENCOUNTER — Other Ambulatory Visit: Payer: Self-pay

## 2020-04-05 DIAGNOSIS — E785 Hyperlipidemia, unspecified: Secondary | ICD-10-CM | POA: Diagnosis not present

## 2020-04-05 DIAGNOSIS — I10 Essential (primary) hypertension: Secondary | ICD-10-CM | POA: Diagnosis not present

## 2020-04-05 DIAGNOSIS — E1165 Type 2 diabetes mellitus with hyperglycemia: Secondary | ICD-10-CM | POA: Diagnosis not present

## 2020-04-05 DIAGNOSIS — R32 Unspecified urinary incontinence: Secondary | ICD-10-CM | POA: Diagnosis not present

## 2020-04-05 DIAGNOSIS — G459 Transient cerebral ischemic attack, unspecified: Secondary | ICD-10-CM | POA: Diagnosis not present

## 2020-04-05 DIAGNOSIS — E1140 Type 2 diabetes mellitus with diabetic neuropathy, unspecified: Secondary | ICD-10-CM | POA: Diagnosis not present

## 2020-04-05 MED ORDER — SITAGLIPTIN PHOSPHATE 50 MG PO TABS: 50.0000 mg | ORAL_TABLET | Freq: Every day | ORAL | 0 refills | Status: DC

## 2020-04-09 ENCOUNTER — Other Ambulatory Visit: Payer: Self-pay | Admitting: "Endocrinology

## 2020-04-11 ENCOUNTER — Other Ambulatory Visit: Payer: Self-pay | Admitting: "Endocrinology

## 2020-04-12 DIAGNOSIS — E1165 Type 2 diabetes mellitus with hyperglycemia: Secondary | ICD-10-CM | POA: Diagnosis not present

## 2020-04-12 DIAGNOSIS — G459 Transient cerebral ischemic attack, unspecified: Secondary | ICD-10-CM | POA: Diagnosis not present

## 2020-04-12 DIAGNOSIS — E785 Hyperlipidemia, unspecified: Secondary | ICD-10-CM | POA: Diagnosis not present

## 2020-04-12 DIAGNOSIS — E1140 Type 2 diabetes mellitus with diabetic neuropathy, unspecified: Secondary | ICD-10-CM | POA: Diagnosis not present

## 2020-04-12 DIAGNOSIS — R32 Unspecified urinary incontinence: Secondary | ICD-10-CM | POA: Diagnosis not present

## 2020-04-12 DIAGNOSIS — I10 Essential (primary) hypertension: Secondary | ICD-10-CM | POA: Diagnosis not present

## 2020-04-13 DIAGNOSIS — E785 Hyperlipidemia, unspecified: Secondary | ICD-10-CM | POA: Diagnosis not present

## 2020-04-13 DIAGNOSIS — R32 Unspecified urinary incontinence: Secondary | ICD-10-CM | POA: Diagnosis not present

## 2020-04-13 DIAGNOSIS — E1140 Type 2 diabetes mellitus with diabetic neuropathy, unspecified: Secondary | ICD-10-CM | POA: Diagnosis not present

## 2020-04-13 DIAGNOSIS — I10 Essential (primary) hypertension: Secondary | ICD-10-CM | POA: Diagnosis not present

## 2020-04-13 DIAGNOSIS — E1165 Type 2 diabetes mellitus with hyperglycemia: Secondary | ICD-10-CM | POA: Diagnosis not present

## 2020-04-13 DIAGNOSIS — G459 Transient cerebral ischemic attack, unspecified: Secondary | ICD-10-CM | POA: Diagnosis not present

## 2020-04-19 DIAGNOSIS — R32 Unspecified urinary incontinence: Secondary | ICD-10-CM | POA: Diagnosis not present

## 2020-04-19 DIAGNOSIS — E1165 Type 2 diabetes mellitus with hyperglycemia: Secondary | ICD-10-CM | POA: Diagnosis not present

## 2020-04-19 DIAGNOSIS — E785 Hyperlipidemia, unspecified: Secondary | ICD-10-CM | POA: Diagnosis not present

## 2020-04-19 DIAGNOSIS — E1140 Type 2 diabetes mellitus with diabetic neuropathy, unspecified: Secondary | ICD-10-CM | POA: Diagnosis not present

## 2020-04-19 DIAGNOSIS — G459 Transient cerebral ischemic attack, unspecified: Secondary | ICD-10-CM | POA: Diagnosis not present

## 2020-04-19 DIAGNOSIS — I10 Essential (primary) hypertension: Secondary | ICD-10-CM | POA: Diagnosis not present

## 2020-04-20 ENCOUNTER — Ambulatory Visit: Payer: Medicare Other | Admitting: Orthopedic Surgery

## 2020-04-20 NOTE — Progress Notes (Deleted)
86 year old female not a surgical candidate comes in for injections requested both knees  Procedure note for bilateral knee injections  Procedure note left knee injection verbal consent was obtained to inject left knee joint  Timeout was completed to confirm the site of injection  The medications used were 40 mg of Depo-Medrol and 1% lidocaine 3 cc  Anesthesia was provided by ethyl chloride and the skin was prepped with alcohol.  After cleaning the skin with alcohol a 20-gauge needle was used to inject the left knee joint. There were no complications. A sterile bandage was applied.   Procedure note right knee injection verbal consent was obtained to inject right knee joint  Timeout was completed to confirm the site of injection  The medications used were 40 mg of Depo-Medrol and 1% lidocaine 3 cc  Anesthesia was provided by ethyl chloride and the skin was prepped with alcohol.  After cleaning the skin with alcohol a 20-gauge needle was used to inject the right knee joint. There were no complications. A sterile bandage was applied.

## 2020-04-26 DIAGNOSIS — G459 Transient cerebral ischemic attack, unspecified: Secondary | ICD-10-CM | POA: Diagnosis not present

## 2020-04-26 DIAGNOSIS — E785 Hyperlipidemia, unspecified: Secondary | ICD-10-CM | POA: Diagnosis not present

## 2020-04-26 DIAGNOSIS — I10 Essential (primary) hypertension: Secondary | ICD-10-CM | POA: Diagnosis not present

## 2020-04-26 DIAGNOSIS — R32 Unspecified urinary incontinence: Secondary | ICD-10-CM | POA: Diagnosis not present

## 2020-04-26 DIAGNOSIS — E1140 Type 2 diabetes mellitus with diabetic neuropathy, unspecified: Secondary | ICD-10-CM | POA: Diagnosis not present

## 2020-04-26 DIAGNOSIS — E1165 Type 2 diabetes mellitus with hyperglycemia: Secondary | ICD-10-CM | POA: Diagnosis not present

## 2020-04-27 DIAGNOSIS — R32 Unspecified urinary incontinence: Secondary | ICD-10-CM | POA: Diagnosis not present

## 2020-04-27 DIAGNOSIS — E1165 Type 2 diabetes mellitus with hyperglycemia: Secondary | ICD-10-CM | POA: Diagnosis not present

## 2020-04-27 DIAGNOSIS — G459 Transient cerebral ischemic attack, unspecified: Secondary | ICD-10-CM | POA: Diagnosis not present

## 2020-04-27 DIAGNOSIS — E785 Hyperlipidemia, unspecified: Secondary | ICD-10-CM | POA: Diagnosis not present

## 2020-04-28 DIAGNOSIS — I10 Essential (primary) hypertension: Secondary | ICD-10-CM | POA: Diagnosis not present

## 2020-04-28 DIAGNOSIS — E1140 Type 2 diabetes mellitus with diabetic neuropathy, unspecified: Secondary | ICD-10-CM | POA: Diagnosis not present

## 2020-04-28 DIAGNOSIS — E1165 Type 2 diabetes mellitus with hyperglycemia: Secondary | ICD-10-CM | POA: Diagnosis not present

## 2020-04-28 DIAGNOSIS — G459 Transient cerebral ischemic attack, unspecified: Secondary | ICD-10-CM | POA: Diagnosis not present

## 2020-04-28 DIAGNOSIS — E785 Hyperlipidemia, unspecified: Secondary | ICD-10-CM | POA: Diagnosis not present

## 2020-04-28 DIAGNOSIS — R32 Unspecified urinary incontinence: Secondary | ICD-10-CM | POA: Diagnosis not present

## 2020-05-12 ENCOUNTER — Encounter: Payer: Self-pay | Admitting: "Endocrinology

## 2020-05-12 ENCOUNTER — Ambulatory Visit (INDEPENDENT_AMBULATORY_CARE_PROVIDER_SITE_OTHER): Payer: Medicare Other | Admitting: Orthopedic Surgery

## 2020-05-12 ENCOUNTER — Other Ambulatory Visit: Payer: Self-pay

## 2020-05-12 ENCOUNTER — Encounter: Payer: Self-pay | Admitting: Orthopedic Surgery

## 2020-05-12 ENCOUNTER — Ambulatory Visit (INDEPENDENT_AMBULATORY_CARE_PROVIDER_SITE_OTHER): Payer: Medicare Other | Admitting: "Endocrinology

## 2020-05-12 VITALS — BP 165/63 | HR 80 | Ht 69.0 in

## 2020-05-12 VITALS — BP 140/60 | HR 68 | Ht 69.0 in | Wt 199.0 lb

## 2020-05-12 DIAGNOSIS — M25562 Pain in left knee: Secondary | ICD-10-CM | POA: Diagnosis not present

## 2020-05-12 DIAGNOSIS — I10 Essential (primary) hypertension: Secondary | ICD-10-CM | POA: Diagnosis not present

## 2020-05-12 DIAGNOSIS — G8929 Other chronic pain: Secondary | ICD-10-CM | POA: Diagnosis not present

## 2020-05-12 DIAGNOSIS — M25561 Pain in right knee: Secondary | ICD-10-CM

## 2020-05-12 DIAGNOSIS — E1165 Type 2 diabetes mellitus with hyperglycemia: Secondary | ICD-10-CM | POA: Diagnosis not present

## 2020-05-12 DIAGNOSIS — E782 Mixed hyperlipidemia: Secondary | ICD-10-CM

## 2020-05-12 MED ORDER — TOUJEO MAX SOLOSTAR 300 UNIT/ML ~~LOC~~ SOPN: 100.0000 [IU] | PEN_INJECTOR | Freq: Every day | SUBCUTANEOUS | 6 refills | Status: DC

## 2020-05-12 NOTE — Patient Instructions (Signed)

## 2020-05-12 NOTE — Patient Instructions (Signed)

## 2020-05-12 NOTE — Progress Notes (Signed)
05/12/2020  Endocrinology follow-up note   Subjective:    Patient ID: [name], female    DOB: 1929-05-30,    Past Medical History:  Diagnosis Date  . Diabetes mellitus without complication ([place])   . Diabetic neuropathy ([place])   . Hypertension   . Stroke ([place])   . Urinary incontinence    Past Surgical History:  Procedure Laterality Date  . ABDOMINAL HYSTERECTOMY    . APPENDECTOMY    . BREAST SURGERY     begnin tumor removed  . CATARACT EXTRACTION, BILATERAL     Social History   Socioeconomic History  . Marital status: Married    Spouse name: Abiha Im  . Number of children: 2  . Years of education: Not on file  . Highest education level: Not on file  Occupational History  . 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 Smoking status: Never Smoker  . Smokeless tobacco: Never Used  Substance and Sexual Activity  . Alcohol use: No  . Drug use: No  . Sexual activity: Not on file  Other Topics Concern  . Not on file  Social History Narrative  . Not on file   Social Determinants of Health   Financial Resource Strain:   . Difficulty of Paying Living Expenses:   Food Insecurity:   . Worried About Charity fundraiser in the Last Year:   . Arboriculturist in the Last Year:   Transportation Needs:   . Film/video editor (Medical):   [name] Lack of Transportation (Non-Medical):   Physical Activity:   . Days of Exercise per Week:   . Minutes of Exercise per Session:   Stress:   . Feeling of Stress :   Social Connections:   . Frequency of Communication with Friends and Family:   . Frequency of Social Gatherings with Friends and Family:   . Attends Religious Services:   . Active Member of Clubs or Organizations:   . Attends Archivist Meetings:   [name] Marital Status:    Outpatient Encounter Medications as of 05/12/2020  Medication Sig  . amLODipine (NORVASC) 5 MG tablet Take 1 tablet (5 mg total) by mouth daily.   [name] aspirin 325 MG tablet Take 1 tablet (325 mg total) by mouth daily.  . Cholecalciferol (VITAMIN D3) 5000 units CAPS Take 1 capsule (5,000 Units total) by mouth daily. (Patient taking differently: Take 5,000 Units by mouth every evening. )  . furosemide (LASIX) 40 MG tablet Take 40 mg by mouth daily.  [name] gabapentin (NEURONTIN) 300 MG capsule TAKE ONE CAPSULE (300MG  TOTAL) BY MOUTH THREE TIMES DAILY  . glipiZIDE (GLUCOTROL XL) 5 MG 24 hr tablet TAKE ONE TABLET (5 MG TOTAL) BY MOUTH TWO TIMES DAILY WITH A MEAL.  [name] insulin glargine, 2 Unit Dial, (TOUJEO MAX SOLOSTAR) 300 UNIT/ML Solostar Pen Inject 100 Units into the skin at bedtime.  . Magnesium 200 MG TABS Take 1 tablet (200 mg total) by mouth daily.  . metFORMIN (GLUCOPHAGE) 500 MG tablet Take 1 tablet (500 mg total) by mouth 2 (two) times daily after a meal.  . metoprolol succinate (TOPROL-XL) 50 MG 24 hr tablet Take 50 mg by mouth at bedtime. Take with or immediately following a meal.   . MYRBETRIQ 50 MG TB24 tablet Take 50 mg by mouth every evening.   . nystatin (MYCOSTATIN/NYSTOP) powder Apply topically 2 (two) times daily. Apply to affected skin for treatment of fungal  infection. Please keep area clean and dry.  Glory Rosebush VERIO test strip TEST  3 TIMES DAILY  . simvastatin (ZOCOR) 20 MG tablet Take 20 mg by mouth every evening.  . [DISCONTINUED] Insulin Glargine (LANTUS) 100 UNIT/ML Solostar Pen Inject 100 Units into the skin 2 (two) times daily at 8 am and 10 pm.  . [DISCONTINUED] sitaGLIPtin (JANUVIA) 50 MG tablet Take 1 tablet (50 mg total) by mouth daily.   No facility-administered encounter medications on file as of 05/12/2020.   ALLERGIES: Allergies  Allergen Reactions  . Codeine Nausea And Vomiting   VACCINATION STATUS: Immunization History  Administered Date(s) Administered  . Influenza-Unspecified 10/24/2018    Diabetes She presents for her follow-up diabetic visit. She has type 2 diabetes mellitus. Onset time: She was  diagnosed at approximate age of 39 years. Her disease course has been improving. There are no hypoglycemic associated symptoms. Pertinent negatives for hypoglycemia include no confusion, headaches, pallor or seizures. Associated symptoms include polydipsia and polyuria. Pertinent negatives for diabetes include no chest pain, no fatigue and no polyphagia. There are no hypoglycemic complications. Symptoms are improving. There are no diabetic complications. Risk factors for coronary artery disease include dyslipidemia, diabetes mellitus and hypertension. Current diabetic treatment includes oral agent (dual therapy). Her weight is fluctuating minimally. She is following a generally unhealthy diet. She has had a previous visit with a dietitian. Her home blood glucose trend is decreasing steadily. Her breakfast blood glucose range is generally 180-200 mg/dl. Her bedtime blood glucose range is generally 180-200 mg/dl. Her overall blood glucose range is 180-200 mg/dl. (She presents with improved, still significantly above target glycemic profile.  Her previsit labs show A1c of 8.7%, overall improved from 12.3%.) An ACE inhibitor/angiotensin II receptor blocker is being taken.  Hyperlipidemia This is a chronic problem. The current episode started more than 1 year ago. Exacerbating diseases include diabetes and obesity. Pertinent negatives include no chest pain, myalgias or shortness of breath. Current antihyperlipidemic treatment includes statins. Risk factors for coronary artery disease include diabetes mellitus, dyslipidemia, hypertension, a sedentary lifestyle, post-menopausal and obesity.  Hypertension This is a chronic problem. The current episode started more than 1 year ago. The problem is controlled. Pertinent negatives include no chest pain, headaches, palpitations or shortness of breath. Risk factors for coronary artery disease include dyslipidemia, diabetes mellitus, family history, obesity, sedentary  lifestyle and post-menopausal state. Past treatments include ACE inhibitors. The current treatment provides moderate improvement.    Review of systems  Constitutional: + Minimally fluctuating body weight,  current  Body mass index is 29.39 kg/m. , no fatigue, no subjective hyperthermia, no subjective hypothermia Eyes: no blurry vision, no xerophthalmia ENT: no sore throat, no nodules palpated in throat, no dysphagia/odynophagia, no hoarseness Cardiovascular: no Chest Pain, no Shortness of Breath, no palpitations, no leg swelling Respiratory: no cough, no shortness of breath Gastrointestinal: no Nausea/Vomiting/Diarhhea Musculoskeletal: + Wheelchair-bound due to recurrent TIA, disequilibrium.   Skin: no rashes, no hyperemia Neurological: no tremors, no numbness, no tingling, no dizziness Psychiatric: no depression, no anxiety    Objective:    BP 140/60   Pulse 68   Ht 5\' 9"  (1.753 m)   Wt 199 lb (90.3 kg)   BMI 29.39 kg/m   Wt Readings from Last 3 Encounters:  05/12/20 199 lb (90.3 kg)  02/29/20 202 lb 9.6 oz (91.9 kg)  07/27/19 206 lb (93.4 kg)     Physical Exam- Limited  Constitutional:  Body mass index is 29.39 kg/m. , not  in acute distress, normal state of mind Eyes:  EOMI, no exophthalmos Neck: Supple Thyroid: No gross goiter Respiratory: Adequate breathing efforts Musculoskeletal: + Wheelchair-bound due to disequilibrium, recent TIA with no focal deficit , strength intact in all four extremities, no gross restriction of joint movements Skin:  no rashes, no hyperemia Neurological: no tremor with outstretched hands,   Diabetic Labs (most recent): Lab Results  Component Value Date   HGBA1C 8.7 (H) 03/01/2020   HGBA1C 9.2 (A) 12/01/2019   HGBA1C 9.2 11/30/2019   CMP     Component Value Date/Time   NA 135 02/29/2020 0039   NA 142 09/25/2018 1126   K 3.8 02/29/2020 0039   CL 101 02/29/2020 0039   CO2 25 02/29/2020 0039   GLUCOSE 329 (H) 02/29/2020 0039    BUN 17 02/29/2020 0039   BUN 24 09/25/2018 1126   CREATININE 0.82 02/29/2020 0947   CALCIUM 8.8 (L) 02/29/2020 0039   PROT 6.6 02/29/2020 0039   PROT 6.2 09/25/2018 1126   ALBUMIN 3.3 (L) 02/29/2020 0039   ALBUMIN 3.7 09/25/2018 1126   AST 16 02/29/2020 0039   ALT 13 02/29/2020 0039   ALKPHOS 85 02/29/2020 0039   BILITOT 0.5 02/29/2020 0039   BILITOT 0.6 09/25/2018 1126   GFRNONAA >60 02/29/2020 0947   GFRAA >60 02/29/2020 0947   Lipid Panel     Component Value Date/Time   CHOL 106 03/01/2020 0523   CHOL 120 11/13/2017 1037   TRIG 137 03/01/2020 0523   HDL 39 (L) 03/01/2020 0523   HDL 45 11/13/2017 1037   CHOLHDL 2.7 03/01/2020 0523   VLDL 27 03/01/2020 0523   LDLCALC 40 03/01/2020 0523   LDLCALC 48 11/13/2017 1037   LABVLDL 27 11/13/2017 1037     Assessment & Plan:   1. Uncontrolled type 2 diabetes mellitus with CKD as a complication, without long-term current use of insulin ([place])  She presents with improved, still significantly above target glycemic profile.  Her previsit labs show A1c of 8.7%, overall improved from 12.3%.   - Patient remains at a high risk for more acute and chronic complications of diabetes which include CAD, CVA, CKD, retinopathy, and neuropathy. These are all discussed in detail with the patient.  - I have re-counseled the patient on diet management and weight loss  by adopting a carbohydrate restricted / protein rich  Diet. - Patient is advised to stick to a routine mealtimes to eat 3 meals  a day and avoid unnecessary snacks ( to snack only to correct hypoglycemia).  - she  admits there is a room for improvement in her diet and drink choices. -  Suggestion is made for her to avoid simple carbohydrates  from her diet including Cakes, Sweet Desserts / Pastries, Ice Cream, Soda (diet and regular), Sweet Tea, Candies, Chips, Cookies, Sweet Pastries,  Store Bought Juices, Alcohol in Excess of  1-2 drinks a day, Artificial Sweeteners, Coffee Creamer,  and "Sugar-free" Products. This will help patient to have stable blood glucose profile and potentially avoid unintended weight gain.   - I have approached patient with the following individualized plan to manage diabetes and patient agrees.   -She will continue to require  insulin in order for her to maintain control of diabetes to target.   -For simplicity reasons, she will be kept on optimal dose of basal insulin, Toujeo max 100 units subcutaneously nightly, associated with strict monitoring of blood glucose twice a day-daily before breakfast and at bedtime.   -  She is advised to finish her current supply to Januvia 50 mg p.o. daily at breakfast, and discontinued due to cost.    -She is advised to continue Metformin 500 mg p.o. twice daily, continue glipizide 5 mg p.o. twice daily with breakfast and supper.    -Patient is encouraged to call clinic for blood glucose levels less than 70 or above 300 mg /dlx3.  - Patient specific target  for A1c; LDL, HDL, Triglycerides,  were discussed in detail.  2) BP/HTN:  -Her blood pressure is controlled to target.  She is advised to continue her current blood pressure medications including lisinopril 20 mg p.o. twice daily.   3) Lipids/HPL: Her LDL is controlled at 48.  She is tolerating and advised to continue simvastatin 20 mg p.o. nightly.  Side effects and precautions discussed with her.   4)  Weight/Diet: Her BMI is 29.3.  She is a candidate for some weight loss.  CDE consult in progress, exercise, and carbohydrates information provided.  5)  Vitamin D deficiency,  - She is status post 12 weeks of  vitamin D therapy, 50,000 units weekly x 12 weeks.   6 ) Chronic Care/Health Maintenance:  -Patien is on ACEI and Statin medications and encouraged to continue to follow up with Ophthalmology, Podiatrist at least yearly or according to recommendations, and advised to stay away from smoking. I have recommended yearly flu vaccine and pneumonia  vaccination at least every 5 years; and  sleep for at least 7 hours a day.  I advised patient to maintain close follow up with her PCP for primary care needs.   - Time spent on this patient care encounter:  35 min, of which > 50% was spent in  counseling and the rest reviewing her blood glucose logs , discussing her hypoglycemia and hyperglycemia episodes, reviewing her current and  previous labs / studies  ( including abstraction from other facilities) and medications  doses and developing a  long term treatment plan and documenting her care.   Please refer to Patient Instructions for Blood Glucose Monitoring and Insulin/Medications Dosing Guide"  in media tab for additional information. Please  also refer to " Patient Self Inventory" in the Media  tab for reviewed elements of pertinent patient history.  [name] participated in the discussions, expressed understanding, and voiced agreement with the above plans.  All questions were answered to her satisfaction. she is encouraged to contact clinic should she have any questions or concerns prior to her return visit.   Follow up plan: Return in about 6 months (around 11/12/2020) for Bring Meter and Logs- A1c in Office.  Glade Lloyd, MD Phone: [phone]  Fax: [phone]  -  This note was partially dictated with voice recognition software. Similar sounding words can be transcribed inadequately or may not  be corrected upon review.  05/12/2020, 5:50 PM

## 2020-05-12 NOTE — Progress Notes (Signed)
Chief Complaint  Patient presents with  . Injections    bilateral knees     REQ INJ BOTH KNEES    Procedure note for bilateral knee injections   Procedure note left knee injection verbal consent was obtained to inject left knee joint   Timeout was completed to confirm the site of injection   The medications used were 40 mg of Depo-Medrol and 1% lidocaine 3 cc   Anesthesia was provided by ethyl chloride and the skin was prepped with alcohol.   After cleaning the skin with alcohol a 20-gauge needle was used to inject the left knee joint. There were no complications. A sterile bandage was applied.     Procedure note right knee injection verbal consent was obtained to inject right knee joint   Timeout was completed to confirm the site of injection   The medications used were 40 mg of Depo-Medrol and 1% lidocaine 3 cc   Anesthesia was provided by ethyl chloride and the skin was prepped with alcohol.   After cleaning the skin with alcohol a 20-gauge needle was used to inject the right knee joint. There were no complications. A sterile bandage was applied.       Encounter Diagnosis  Name Primary?  . Chronic pain of both knees Yes   Encounter Diagnosis  Name Primary?  . Chronic pain of both knees Yes

## 2020-05-20 DIAGNOSIS — I10 Essential (primary) hypertension: Secondary | ICD-10-CM | POA: Diagnosis not present

## 2020-05-20 DIAGNOSIS — E782 Mixed hyperlipidemia: Secondary | ICD-10-CM | POA: Diagnosis not present

## 2020-05-20 DIAGNOSIS — E1129 Type 2 diabetes mellitus with other diabetic kidney complication: Secondary | ICD-10-CM | POA: Diagnosis not present

## 2020-06-07 DIAGNOSIS — E1142 Type 2 diabetes mellitus with diabetic polyneuropathy: Secondary | ICD-10-CM | POA: Diagnosis not present

## 2020-06-07 DIAGNOSIS — L851 Acquired keratosis [keratoderma] palmaris et plantaris: Secondary | ICD-10-CM | POA: Diagnosis not present

## 2020-06-07 DIAGNOSIS — B351 Tinea unguium: Secondary | ICD-10-CM | POA: Diagnosis not present

## 2020-06-20 ENCOUNTER — Other Ambulatory Visit: Payer: Self-pay | Admitting: "Endocrinology

## 2020-06-27 ENCOUNTER — Other Ambulatory Visit: Payer: Self-pay | Admitting: "Endocrinology

## 2020-08-03 ENCOUNTER — Other Ambulatory Visit: Payer: Self-pay

## 2020-08-03 MED ORDER — GABAPENTIN 300 MG PO CAPS: ORAL_CAPSULE | ORAL | 0 refills | Status: DC

## 2020-08-15 ENCOUNTER — Other Ambulatory Visit: Payer: Self-pay

## 2020-08-15 ENCOUNTER — Ambulatory Visit: Payer: Medicare Other | Admitting: Orthopedic Surgery

## 2020-08-15 DIAGNOSIS — E1165 Type 2 diabetes mellitus with hyperglycemia: Secondary | ICD-10-CM

## 2020-08-15 MED ORDER — PEN NEEDLES 31G X 8 MM MISC: 1 refills | Status: DC

## 2020-08-17 ENCOUNTER — Other Ambulatory Visit: Payer: Self-pay | Admitting: "Endocrinology

## 2020-08-17 DIAGNOSIS — E1165 Type 2 diabetes mellitus with hyperglycemia: Secondary | ICD-10-CM

## 2020-08-23 ENCOUNTER — Other Ambulatory Visit: Payer: Self-pay

## 2020-08-23 DIAGNOSIS — E1165 Type 2 diabetes mellitus with hyperglycemia: Secondary | ICD-10-CM

## 2020-08-23 MED ORDER — METFORMIN HCL 500 MG PO TABS: ORAL_TABLET | ORAL | 1 refills | Status: DC

## 2020-08-30 ENCOUNTER — Other Ambulatory Visit: Payer: Self-pay

## 2020-08-30 DIAGNOSIS — E1165 Type 2 diabetes mellitus with hyperglycemia: Secondary | ICD-10-CM

## 2020-08-30 DIAGNOSIS — E782 Mixed hyperlipidemia: Secondary | ICD-10-CM

## 2020-08-30 MED ORDER — SIMVASTATIN 20 MG PO TABS: 20.0000 mg | ORAL_TABLET | Freq: Every evening | ORAL | 1 refills | Status: DC

## 2020-08-30 MED ORDER — GLIPIZIDE ER 5 MG PO TB24: ORAL_TABLET | ORAL | 1 refills | Status: DC

## 2020-09-15 ENCOUNTER — Other Ambulatory Visit: Payer: Self-pay

## 2020-09-15 ENCOUNTER — Ambulatory Visit (INDEPENDENT_AMBULATORY_CARE_PROVIDER_SITE_OTHER): Payer: Medicare Other | Admitting: Orthopedic Surgery

## 2020-09-15 ENCOUNTER — Encounter: Payer: Self-pay | Admitting: Orthopedic Surgery

## 2020-09-15 VITALS — Ht 69.0 in | Wt 199.0 lb

## 2020-09-15 DIAGNOSIS — G8929 Other chronic pain: Secondary | ICD-10-CM | POA: Diagnosis not present

## 2020-09-15 DIAGNOSIS — M1711 Unilateral primary osteoarthritis, right knee: Secondary | ICD-10-CM

## 2020-09-15 DIAGNOSIS — M25561 Pain in right knee: Secondary | ICD-10-CM

## 2020-09-15 DIAGNOSIS — M1712 Unilateral primary osteoarthritis, left knee: Secondary | ICD-10-CM

## 2020-09-15 NOTE — Progress Notes (Signed)
Chief Complaint  Patient presents with  . Injections    Encounter Diagnoses  Name Primary?  . Chronic pain of both knees Yes  . Primary osteoarthritis of right knee   . Primary osteoarthritis of left knee    [name] was seen today for injections.  Diagnoses and all orders for this visit:  Chronic pain of both knees  Primary osteoarthritis of right knee  Primary osteoarthritis of left knee   Procedure note for bilateral knee injections  Procedure note left knee injection verbal consent was obtained to inject left knee joint  Timeout was completed to confirm the site of injection  The medications used were 40 mg of Depo-Medrol and 1% lidocaine 3 cc  Anesthesia was provided by ethyl chloride and the skin was prepped with alcohol.  After cleaning the skin with alcohol a 20-gauge needle was used to inject the left knee joint. There were no complications. A sterile bandage was applied.   Procedure note right knee injection verbal consent was obtained to inject right knee joint  Timeout was completed to confirm the site of injection  The medications used were 40 mg of Depo-Medrol and 1% lidocaine 3 cc  Anesthesia was provided by ethyl chloride and the skin was prepped with alcohol.  After cleaning the skin with alcohol a 20-gauge needle was used to inject the right knee joint. There were no complications. A sterile bandage was applied.

## 2020-09-27 DIAGNOSIS — B351 Tinea unguium: Secondary | ICD-10-CM | POA: Diagnosis not present

## 2020-09-27 DIAGNOSIS — L851 Acquired keratosis [keratoderma] palmaris et plantaris: Secondary | ICD-10-CM | POA: Diagnosis not present

## 2020-09-27 DIAGNOSIS — E1142 Type 2 diabetes mellitus with diabetic polyneuropathy: Secondary | ICD-10-CM | POA: Diagnosis not present

## 2020-10-03 ENCOUNTER — Ambulatory Visit (INDEPENDENT_AMBULATORY_CARE_PROVIDER_SITE_OTHER): Payer: Medicare Other | Admitting: Urology

## 2020-10-03 ENCOUNTER — Encounter: Payer: Self-pay | Admitting: Urology

## 2020-10-03 ENCOUNTER — Other Ambulatory Visit: Payer: Self-pay

## 2020-10-03 DIAGNOSIS — N3001 Acute cystitis with hematuria: Secondary | ICD-10-CM | POA: Diagnosis not present

## 2020-10-03 DIAGNOSIS — N3281 Overactive bladder: Secondary | ICD-10-CM | POA: Insufficient documentation

## 2020-10-03 LAB — MICROSCOPIC EXAMINATION: Renal Epithel, UA: NONE SEEN /hpf

## 2020-10-03 LAB — URINALYSIS, ROUTINE W REFLEX MICROSCOPIC
Bilirubin, UA: NEGATIVE
Ketones, UA: NEGATIVE
Nitrite, UA: NEGATIVE
Specific Gravity, UA: 1.025 (ref 1.005–1.030)
Urobilinogen, Ur: 0.2 mg/dL (ref 0.2–1.0)
pH, UA: 5 (ref 5.0–7.5)

## 2020-10-03 MED ORDER — SULFAMETHOXAZOLE-TRIMETHOPRIM 800-160 MG PO TABS: 1.0000 | ORAL_TABLET | Freq: Two times a day (BID) | ORAL | 0 refills | Status: DC

## 2020-10-03 MED ORDER — MIRABEGRON ER 50 MG PO TB24
50.0000 mg | ORAL_TABLET | Freq: Every evening | ORAL | 11 refills | Status: DC
Start: 2020-10-03 — End: 2021-08-29

## 2020-10-03 NOTE — Progress Notes (Signed)
10/03/2020 2:54 PM   [name] Apr 07, 1929 [id number]  Referring provider: Sharilyn Sites, MD [address] [place],  [place] [address]  Overactive bladder  HPI: [name] is a 86yo here for overactive bladder. She was previously seen by Dr. Karsten Ro. She takes mirabegron 50mg  which works well for OAB. UA today is concerning for infection. She has been having dysuria for 2 weeks. She has worsening urinary urgency, frequency, nocturia.  Here records from [facility] are as follows: I have an overactive bladder.  HPI: [name] is a 43 year-old female established patient who is here for evaluation of overactive bladder.  She does not have pain with urination. She does not have hematuria. She does have frequency. She does have urgency. She does have problems getting to the bathroom in time after she has the urge to urinate. She is having problems emptying her bladder.   She is taking myrbetriq for her over active bladder. This condition would be considered of mild to moderate severity with no modifying factors or associated signs or symptoms other than as noted above.   Bladder overactivity: This has been confirmed with urodynamics and managed with Myrbetriq 50 mg which works well for her.   07/30/19: She returns for yearly follow-up of her bladder overactivity. It is been managed with Myrbetriq 50 mg. Her only complaint is she has noted some slight increased frequency because her sugars a benign to control and she has had to go to the hospital for that a more than 1 occasion. Otherwise she is without complaints. She has not had any difficulty with infections.      PMH: Past Medical History:  Diagnosis Date  . Diabetes mellitus without complication ([place])   . Diabetic neuropathy ([place])   . Hypertension   . Stroke ([place])   . Urinary incontinence     Surgical History: Past Surgical History:  Procedure Laterality Date  . ABDOMINAL HYSTERECTOMY    . APPENDECTOMY    .  BREAST SURGERY     begnin tumor removed  . CATARACT EXTRACTION, BILATERAL      Home Medications:  Allergies as of 10/03/2020      Reactions   Codeine Nausea And Vomiting      Medication List       Accurate as of October 03, 2020  2:54 PM. If you have any questions, ask your nurse or doctor.        amLODipine 5 MG tablet Commonly known as: NORVASC Take 1 tablet (5 mg total) by mouth daily.   aspirin 325 MG tablet Take 1 tablet (325 mg total) by mouth daily.   furosemide 40 MG tablet Commonly known as: LASIX Take 40 mg by mouth daily.   gabapentin 300 MG capsule Commonly known as: NEURONTIN TAKE 1 CAPSULE BY MOUTH 3  TIMES DAILY   glipiZIDE 5 MG 24 hr tablet Commonly known as: GLUCOTROL XL TAKE ONE TABLET (5 MG TOTAL) BY MOUTH TWO TIMES DAILY WITH A MEAL.   Magnesium 200 MG Tabs Take 1 tablet (200 mg total) by mouth daily.   metFORMIN 500 MG tablet Commonly known as: GLUCOPHAGE TAKE ONE TABLET (500MG  TOTAL) BY MOUTH TWO TIMES DAILY AFTER A MEAL   metoprolol succinate 50 MG 24 hr tablet Commonly known as: TOPROL-XL Take 50 mg by mouth at bedtime. Take with or immediately following a meal.   Myrbetriq 50 MG Tb24 tablet Generic drug: mirabegron ER Take 50 mg by mouth every evening.   nystatin powder Commonly  known as: MYCOSTATIN/NYSTOP Apply topically 2 (two) times daily. Apply to affected skin for treatment of fungal infection. Please keep area clean and dry.   OneTouch Verio test strip Generic drug: glucose blood TEST  3 TIMES DAILY   Pen Needles 31G X 8 MM Misc Use as directed to inject insulin once daily   simvastatin 20 MG tablet Commonly known as: ZOCOR Take 1 tablet (20 mg total) by mouth every evening.   Toujeo Max SoloStar 300 UNIT/ML Solostar Pen Generic drug: insulin glargine (2 Unit Dial) Inject 100 Units into the skin at bedtime.   Vitamin D3 125 MCG (5000 UT) Caps Take 1 capsule (5,000 Units total) by mouth daily. What changed: when to  take this   zolpidem 10 MG tablet Commonly known as: AMBIEN       Allergies:  Allergies  Allergen Reactions  . Codeine Nausea And Vomiting    Family History: Family History  Problem Relation Age of Onset  . CAD Mother   . Hypertension Mother   . Stroke Father   . Heart attack Brother   . Cancer Brother     Social History:  reports that she has never smoked. She has never used smokeless tobacco. She reports that she does not drink alcohol and does not use drugs.  ROS: All other review of systems were reviewed and are negative except what is noted above in HPI  Physical Exam: There were no vitals taken for this visit.  Constitutional:  Alert and oriented, No acute distress. HEENT: [place] AT, moist mucus membranes.  Trachea midline, no masses. Cardiovascular: No clubbing, cyanosis, or edema. Respiratory: Normal respiratory effort, no increased work of breathing. GI: Abdomen is soft, nontender, nondistended, no abdominal masses GU: No CVA tenderness.  Lymph: No cervical or inguinal lymphadenopathy. Skin: No rashes, bruises or suspicious lesions. Neurologic: Grossly intact, no focal deficits, moving all 4 extremities. Psychiatric: Normal mood and affect.  Laboratory Data: Lab Results  Component Value Date   WBC 7.0 02/29/2020   HGB 13.0 02/29/2020   HCT 39.9 02/29/2020   MCV 89.1 02/29/2020   PLT 241 02/29/2020    Lab Results  Component Value Date   CREATININE 0.82 02/29/2020    No results found for: PSA  No results found for: TESTOSTERONE  Lab Results  Component Value Date   HGBA1C 8.7 (H) 03/01/2020    Urinalysis    Component Value Date/Time   COLORURINE STRAW (A) 07/14/2019 0038   APPEARANCEUR CLEAR 07/14/2019 0038   LABSPEC 1.026 07/14/2019 0038   PHURINE 7.0 07/14/2019 0038   GLUCOSEU >=500 (A) 07/14/2019 0038   HGBUR NEGATIVE 07/14/2019 0038   BILIRUBINUR NEGATIVE 07/14/2019 0038   KETONESUR 5 (A) 07/14/2019 0038   PROTEINUR NEGATIVE  07/14/2019 0038   UROBILINOGEN 0.2 09/06/2009 0135   NITRITE NEGATIVE 07/14/2019 0038   LEUKOCYTESUR NEGATIVE 07/14/2019 0038    Lab Results  Component Value Date   BACTERIA NONE SEEN 07/14/2019    Pertinent Imaging:  No results found for this or any previous visit.  No results found for this or any previous visit.  No results found for this or any previous visit.  No results found for this or any previous visit.  No results found for this or any previous visit.  No results found for this or any previous visit.  No results found for this or any previous visit.  No results found for this or any previous visit.   Assessment & Plan:    1.  Overactive bladder -refill mirabegron 50mg  - Urinalysis, Routine w reflex microscopic  2. Acute cystitis -urine culture -bactrim DS BID for 7 days   No follow-ups on file.  Nicolette Bang, MD  [facility] [place]

## 2020-10-03 NOTE — Patient Instructions (Signed)

## 2020-10-03 NOTE — Progress Notes (Signed)
Urological Symptom Review  Patient is experiencing the following symptoms: Frequent urination Get up at night to urinate   Review of Systems  Gastrointestinal (upper)  : Negative for upper GI symptoms  Gastrointestinal (lower) : Negative for lower GI symptoms  Constitutional : Negative for symptoms  Skin: Negative for skin symptoms  Eyes: Negative for eye symptoms  Ear/Nose/Throat : Negative for Ear/Nose/Throat symptoms  Hematologic/Lymphatic: Negative for Hematologic/Lymphatic symptoms  Cardiovascular : Leg swelling  Respiratory : Negative for respiratory symptoms  Endocrine: Negative for endocrine symptoms  Musculoskeletal: Negative for musculoskeletal symptoms  Neurological: Negative for neurological symptoms  Psychologic: Negative for psychiatric symptoms

## 2020-10-09 LAB — URINE CULTURE

## 2020-10-11 ENCOUNTER — Telehealth: Payer: Self-pay

## 2020-10-11 NOTE — Telephone Encounter (Signed)
Lft msg to call back. Tried both numbers.

## 2020-10-11 NOTE — Telephone Encounter (Signed)
-----   Message from Cleon Gustin, MD sent at 10/11/2020  9:28 AM EDT ----- Culture posiitve and sensitive to bactrim ----- Message ----- From: Valentina Lucks, LPN Sent: [id number]  10:10 AM EDT To: Cleon Gustin, MD  Pls review. You gave her bactrim DS BID for 7 days.

## 2020-10-12 NOTE — Telephone Encounter (Signed)
-----   Message from Cleon Gustin, MD sent at 10/11/2020  9:28 AM EDT ----- Culture posiitve and sensitive to bactrim ----- Message ----- From: Valentina Lucks, LPN Sent: 03/70/4888  10:10 AM EDT To: Cleon Gustin, MD  Pls review. You gave her bactrim DS BID for 7 days.

## 2020-10-12 NOTE — Telephone Encounter (Signed)
Pt notified urine culture positive and to take all her med.

## 2020-11-22 ENCOUNTER — Ambulatory Visit: Payer: Medicare Other | Admitting: "Endocrinology

## 2020-11-29 ENCOUNTER — Telehealth: Payer: Self-pay | Admitting: "Endocrinology

## 2020-11-29 ENCOUNTER — Other Ambulatory Visit: Payer: Self-pay | Admitting: "Endocrinology

## 2020-11-29 MED ORDER — SITAGLIPTIN PHOSPHATE 50 MG PO TABS: 50.0000 mg | ORAL_TABLET | Freq: Every day | ORAL | 0 refills | Status: DC

## 2020-11-29 NOTE — Telephone Encounter (Signed)
Patient called back & said [facility] said it was canceled and she was not sure why. She said she has been getting samples from her PMD and now can afford the RX

## 2020-11-29 NOTE — Telephone Encounter (Signed)
Will do a refill for her.

## 2020-11-29 NOTE — Telephone Encounter (Signed)
Pt requesting refill on [place] , she said she is out of the doughnut hole and now can afford it. She wants it sent to [facility]

## 2020-11-29 NOTE — Telephone Encounter (Signed)
Please advise if this is okay as her last appt was in May

## 2020-12-01 ENCOUNTER — Other Ambulatory Visit: Payer: Self-pay | Admitting: "Endocrinology

## 2020-12-02 DIAGNOSIS — Z23 Encounter for immunization: Secondary | ICD-10-CM | POA: Diagnosis not present

## 2020-12-05 ENCOUNTER — Other Ambulatory Visit: Payer: Self-pay | Admitting: "Endocrinology

## 2020-12-05 DIAGNOSIS — E1165 Type 2 diabetes mellitus with hyperglycemia: Secondary | ICD-10-CM

## 2020-12-05 NOTE — Telephone Encounter (Signed)
Please advise if this is ok to send in, last appt was in May, next appt is in Jan.

## 2020-12-05 NOTE — Telephone Encounter (Signed)
She needs to have office visit for her diabetes before refilling her gabapentin

## 2020-12-06 DIAGNOSIS — B351 Tinea unguium: Secondary | ICD-10-CM | POA: Diagnosis not present

## 2020-12-06 DIAGNOSIS — E1142 Type 2 diabetes mellitus with diabetic polyneuropathy: Secondary | ICD-10-CM | POA: Diagnosis not present

## 2020-12-06 DIAGNOSIS — L851 Acquired keratosis [keratoderma] palmaris et plantaris: Secondary | ICD-10-CM | POA: Diagnosis not present

## 2020-12-19 ENCOUNTER — Ambulatory Visit: Payer: Medicare Other | Admitting: Orthopedic Surgery

## 2020-12-22 ENCOUNTER — Ambulatory Visit (INDEPENDENT_AMBULATORY_CARE_PROVIDER_SITE_OTHER): Payer: Medicare Other | Admitting: Orthopedic Surgery

## 2020-12-22 ENCOUNTER — Other Ambulatory Visit: Payer: Self-pay

## 2020-12-22 VITALS — Ht 69.0 in | Wt 199.0 lb

## 2020-12-22 DIAGNOSIS — M1711 Unilateral primary osteoarthritis, right knee: Secondary | ICD-10-CM

## 2020-12-22 DIAGNOSIS — G8929 Other chronic pain: Secondary | ICD-10-CM

## 2020-12-22 DIAGNOSIS — M1712 Unilateral primary osteoarthritis, left knee: Secondary | ICD-10-CM | POA: Diagnosis not present

## 2020-12-22 DIAGNOSIS — M25561 Pain in right knee: Secondary | ICD-10-CM

## 2020-12-22 NOTE — Progress Notes (Signed)
No chief complaint on file.  Encounter Diagnoses  Name Primary?  . Primary osteoarthritis of right knee Yes  . Primary osteoarthritis of left knee   . Chronic pain of both knees      Procedure note for bilateral knee injections  Procedure note left knee injection verbal consent was obtained to inject left knee joint  Timeout was completed to confirm the site of injection  The medications used were 40 mg of Depo-Medrol and 1% lidocaine 3 cc  Anesthesia was provided by ethyl chloride and the skin was prepped with alcohol.  After cleaning the skin with alcohol a 20-gauge needle was used to inject the left knee joint. There were no complications. A sterile bandage was applied.   Procedure note right knee injection verbal consent was obtained to inject right knee joint  Timeout was completed to confirm the site of injection  The medications used were 40 mg of Depo-Medrol and 1% lidocaine 3 cc  Anesthesia was provided by ethyl chloride and the skin was prepped with alcohol.  After cleaning the skin with alcohol a 20-gauge needle was used to inject the right knee joint. There were no complications. A sterile bandage was applied.

## 2020-12-26 ENCOUNTER — Ambulatory Visit: Payer: Medicare Other | Admitting: "Endocrinology

## 2020-12-31 ENCOUNTER — Other Ambulatory Visit: Payer: Self-pay | Admitting: "Endocrinology

## 2020-12-31 DIAGNOSIS — E1165 Type 2 diabetes mellitus with hyperglycemia: Secondary | ICD-10-CM

## 2021-01-02 ENCOUNTER — Telehealth: Payer: Self-pay

## 2021-01-02 ENCOUNTER — Ambulatory Visit: Payer: Medicare Other | Admitting: "Endocrinology

## 2021-01-02 NOTE — Telephone Encounter (Signed)
Dr Dorris Fetch, Patient keeps making appointments and canceling the day of. Please advise. Last office visit 05/12/2020

## 2021-01-03 NOTE — Telephone Encounter (Signed)
See if she can do a phone visit with her logs.

## 2021-01-03 NOTE — Telephone Encounter (Signed)
Phone visit scheduled for Thursday. She said she can not get her sugar under 200, I advised her it is very important to keep this appt.

## 2021-01-05 ENCOUNTER — Telehealth (INDEPENDENT_AMBULATORY_CARE_PROVIDER_SITE_OTHER): Payer: Medicare HMO | Admitting: "Endocrinology

## 2021-01-05 ENCOUNTER — Other Ambulatory Visit: Payer: Self-pay

## 2021-01-05 ENCOUNTER — Encounter: Payer: Self-pay | Admitting: "Endocrinology

## 2021-01-05 VITALS — Ht 69.0 in | Wt 199.0 lb

## 2021-01-05 DIAGNOSIS — I10 Essential (primary) hypertension: Secondary | ICD-10-CM

## 2021-01-05 DIAGNOSIS — E782 Mixed hyperlipidemia: Secondary | ICD-10-CM | POA: Diagnosis not present

## 2021-01-05 DIAGNOSIS — E1165 Type 2 diabetes mellitus with hyperglycemia: Secondary | ICD-10-CM | POA: Diagnosis not present

## 2021-01-05 MED ORDER — TOUJEO MAX SOLOSTAR 300 UNIT/ML ~~LOC~~ SOPN
120.0000 [IU] | PEN_INJECTOR | Freq: Every day | SUBCUTANEOUS | 2 refills | Status: DC
Start: 2021-01-05 — End: 2021-02-09

## 2021-01-05 MED ORDER — SITAGLIPTIN PHOSPHATE 50 MG PO TABS
50.0000 mg | ORAL_TABLET | Freq: Every day | ORAL | 1 refills | Status: DC
Start: 2021-01-05 — End: 2021-02-09

## 2021-01-05 NOTE — Patient Instructions (Signed)

## 2021-01-05 NOTE — Progress Notes (Signed)
01/05/2021  Endocrinology follow-up note   Subjective:    Patient ID: [name], female    DOB: 1929-07-14,    Past Medical History:  Diagnosis Date  . Diabetes mellitus without complication ([place])   . Diabetic neuropathy ([place])   . Hypertension   . Stroke ([place])   . Urinary incontinence    Past Surgical History:  Procedure Laterality Date  . ABDOMINAL HYSTERECTOMY    . APPENDECTOMY    . BREAST SURGERY     begnin tumor removed  . CATARACT EXTRACTION, BILATERAL     Social History   Socioeconomic History  . Marital status: Married    Spouse name: Bellanie Assmann  . Number of children: 2  . Years of education: Not on file  . Highest education level: Not on file  Occupational History  . 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 Smoking status: Never Smoker  . Smokeless tobacco: Never Used  Vaping Use  . Vaping Use: Never used  Substance and Sexual Activity  . Alcohol use: No  . Drug use: No  . Sexual activity: Not on file  Other Topics Concern  . Not on file  Social History Narrative  . Not on file   Social Determinants of Health   Financial Resource Strain: Not on file  Food Insecurity: Not on file  Transportation Needs: Not on file  Physical Activity: Not on file  Stress: Not on file  Social Connections: Not on file   Outpatient Encounter Medications as of 01/05/2021  Medication Sig  . amLODipine (NORVASC) 5 MG tablet Take 1 tablet (5 mg total) by mouth daily.  [name] aspirin 325 MG tablet Take 1 tablet (325 mg total) by mouth daily.  . Cholecalciferol (VITAMIN D3) 5000 units CAPS Take 1 capsule (5,000 Units total) by mouth daily. (Patient taking differently: Take 5,000 Units by mouth every evening.)  . furosemide (LASIX) 40 MG tablet Take 40 mg by mouth daily.  [name] gabapentin (NEURONTIN) 300 MG capsule TAKE 1 CAPSULE BY MOUTH 3  TIMES DAILY  . glipiZIDE (GLUCOTROL XL) 5 MG 24 hr tablet TAKE ONE TABLET (5 MG TOTAL) BY MOUTH  TWO TIMES DAILY WITH A MEAL.  [name] insulin glargine, 2 Unit Dial, (TOUJEO MAX SOLOSTAR) 300 UNIT/ML Solostar Pen Inject 120 Units into the skin at bedtime.  . Insulin Pen Needle (PEN NEEDLES) 31G X 8 MM MISC Use as directed to inject insulin once daily  . Magnesium 200 MG TABS Take 1 tablet (200 mg total) by mouth daily.  . metFORMIN (GLUCOPHAGE) 500 MG tablet TAKE ONE TABLET (500MG  TOTAL) BY MOUTH TWO TIMES DAILY AFTER A MEAL  . metoprolol succinate (TOPROL-XL) 50 MG 24 hr tablet Take 50 mg by mouth at bedtime. Take with or immediately following a meal.  . mirabegron ER (MYRBETRIQ) 50 MG TB24 tablet Take 1 tablet (50 mg total) by mouth every evening.  . nystatin (MYCOSTATIN/NYSTOP) powder Apply topically 2 (two) times daily. Apply to affected skin for treatment of fungal infection. Please keep area clean and dry.  Glory Rosebush VERIO test strip TEST  3 TIMES DAILY  . simvastatin (ZOCOR) 20 MG tablet Take 1 tablet (20 mg total) by mouth every evening.  . sitaGLIPtin (JANUVIA) 50 MG tablet Take 1 tablet (50 mg total) by mouth daily.  [name] sulfamethoxazole-trimethoprim (BACTRIM DS) 800-160 MG tablet Take 1 tablet by mouth every 12 (twelve) hours.  [name] zolpidem (AMBIEN) 10 MG tablet   . [  DISCONTINUED] insulin glargine, 2 Unit Dial, (TOUJEO MAX SOLOSTAR) 300 UNIT/ML Solostar Pen Inject 100 Units into the skin at bedtime.  . [DISCONTINUED] JANUVIA 50 MG tablet TAKE 1 TABLET BY MOUTH  DAILY (Patient not taking: Reported on 01/05/2021)   No facility-administered encounter medications on file as of 01/05/2021.   ALLERGIES: Allergies  Allergen Reactions  . Codeine Nausea And Vomiting   VACCINATION STATUS: Immunization History  Administered Date(s) Administered  . Influenza-Unspecified 10/24/2018    Diabetes She presents for her follow-up diabetic visit. She has type 2 diabetes mellitus. Onset time: She was diagnosed at approximate age of 67 years. Her disease course has been worsening. There are no  hypoglycemic associated symptoms. Pertinent negatives for hypoglycemia include no confusion, headaches, pallor or seizures. Associated symptoms include polydipsia and polyuria. Pertinent negatives for diabetes include no chest pain, no fatigue and no polyphagia. There are no hypoglycemic complications. Symptoms are worsening. There are no diabetic complications. Risk factors for coronary artery disease include dyslipidemia, diabetes mellitus and hypertension. Current diabetic treatment includes oral agent (dual therapy). Her weight is fluctuating minimally. She is following a generally unhealthy diet. She has had a previous visit with a dietitian. Her home blood glucose trend is increasing steadily. Her breakfast blood glucose range is generally >200 mg/dl. Her dinner blood glucose range is generally >200 mg/dl. Her bedtime blood glucose range is generally >200 mg/dl. Her overall blood glucose range is >200 mg/dl. (After missing her appointment since May 2021, she has progressively lost control of her glycemia.  She reports her glycemic readings for the last 7 days: Fasting: 243, 236, 265, 223, 220, 221 Prelunch 237, 324, 334, 324, 325, 364 Bedtime 352, 364, 358, 352, 316, 352, 363  He denies hypoglycemia.  Her last A1c of 8.7%, overall improving from 12.3%.   ) An ACE inhibitor/angiotensin II receptor blocker is being taken.  Hyperlipidemia This is a chronic problem. The current episode started more than 1 year ago. Exacerbating diseases include diabetes and obesity. Pertinent negatives include no chest pain, myalgias or shortness of breath. Current antihyperlipidemic treatment includes statins. Risk factors for coronary artery disease include diabetes mellitus, dyslipidemia, hypertension, a sedentary lifestyle, post-menopausal and obesity.  Hypertension This is a chronic problem. The current episode started more than 1 year ago. The problem is controlled. Pertinent negatives include no chest pain,  headaches, palpitations or shortness of breath. Risk factors for coronary artery disease include dyslipidemia, diabetes mellitus, family history, obesity, sedentary lifestyle and post-menopausal state. Past treatments include ACE inhibitors. The current treatment provides moderate improvement.    Review of systems Limited as above.   Objective:    Ht 5\' 9"  (1.753 m)   Wt 199 lb (90.3 kg)   BMI 29.39 kg/m   Wt Readings from Last 3 Encounters:  01/05/21 199 lb (90.3 kg)  12/22/20 199 lb (90.3 kg)  09/15/20 199 lb (90.3 kg)     Physical Exam- Limited   Diabetic Labs (most recent): Lab Results  Component Value Date   HGBA1C 8.7 (H) 03/01/2020   HGBA1C 9.2 (A) 12/01/2019   HGBA1C 9.2 11/30/2019   CMP     Component Value Date/Time   NA 135 02/29/2020 0039   NA 142 09/25/2018 1126   K 3.8 02/29/2020 0039   CL 101 02/29/2020 0039   CO2 25 02/29/2020 0039   GLUCOSE 329 (H) 02/29/2020 0039   BUN 17 02/29/2020 0039   BUN 24 09/25/2018 1126   CREATININE 0.82 02/29/2020 0947   CALCIUM  8.8 (L) 02/29/2020 0039   PROT 6.6 02/29/2020 0039   PROT 6.2 09/25/2018 1126   ALBUMIN 3.3 (L) 02/29/2020 0039   ALBUMIN 3.7 09/25/2018 1126   AST 16 02/29/2020 0039   ALT 13 02/29/2020 0039   ALKPHOS 85 02/29/2020 0039   BILITOT 0.5 02/29/2020 0039   BILITOT 0.6 09/25/2018 1126   GFRNONAA >60 02/29/2020 0947   GFRAA >60 02/29/2020 0947   Lipid Panel     Component Value Date/Time   CHOL 106 03/01/2020 0523   CHOL 120 11/13/2017 1037   TRIG 137 03/01/2020 0523   HDL 39 (L) 03/01/2020 0523   HDL 45 11/13/2017 1037   CHOLHDL 2.7 03/01/2020 0523   VLDL 27 03/01/2020 0523   LDLCALC 40 03/01/2020 0523   LDLCALC 48 11/13/2017 1037   LABVLDL 27 11/13/2017 1037     Assessment & Plan:   1. Uncontrolled type 2 diabetes mellitus with CKD as a complication, without long-term current use of insulin ([place])  After missing her appointment since May 2021, she has progressively lost control of  her glycemia.  She reports her glycemic readings for the last 7 days: Fasting: 243, 236, 265, 223, 220, 221 Prelunch 237, 324, 334, 324, 325, 364 Bedtime 352, 364, 358, 352, 316, 352, 363  He denies hypoglycemia.  Her last A1c of 8.7% in March 2021, overall improving from 12.3%.   - Patient remains at a high risk for more acute and chronic complications of diabetes which include CAD, CVA, CKD, retinopathy, and neuropathy. These are all discussed in detail with the patient.  - I have re-counseled the patient on diet management and weight loss  by adopting a carbohydrate restricted / protein rich  Diet. - Patient is advised to stick to a routine mealtimes to eat 3 meals  a day and avoid unnecessary snacks ( to snack only to correct hypoglycemia).  - she acknowledges that there is a room for improvement in her food and drink choices. - Suggestion is made for her to avoid simple carbohydrates  from her diet including Cakes, Sweet Desserts, Ice Cream, Soda (diet and regular), Sweet Tea, Candies, Chips, Cookies, Store Bought Juices, Alcohol in Excess of  1-2 drinks a day, Artificial Sweeteners,  Coffee Creamer, and "Sugar-free" Products, Lemonade. This will help patient to have more stable blood glucose profile and potentially avoid unintended weight gain.   - I have approached patient with the following individualized plan to manage diabetes and patient agrees.   -She will continue to require  insulin in order for her to maintain control of diabetes to target.   -She would benefit from simplified treatment regimen.  She is advised to increase her Toujeo to 120 units subcutaneously nightly, associated with monitoring of blood glucose 4 times a day-before meals and at bedtime and will be on a phone visit in 2 weeks.    -She is advised to continue metformin 500 mg p.o. twice daily, glipizide 5 mg p.o. twice a day with breakfast and supper.   -She reports that she is out of the donut hole and is  requesting a prescription for Januvia.  I discussed and prescribed Januvia 50 mg p.o. daily at breakfast.    -Patient is encouraged to call clinic for blood glucose levels less than 70 or above 300 mg /dlx3.  - Patient specific target  for A1c; LDL, HDL, Triglycerides,  were discussed in detail.  2) BP/HTN:  she is advised to home monitor blood pressure and report if >  140/90 on 2 separate readings.   She is advised to continue her current blood pressure medications including lisinopril 20 mg p.o. twice daily.   3) Lipids/HPL: Her LDL is controlled at 48.  She is tolerating and advised to continue statin 20 mg p.o. nightly.  Side effects and precautions discussed with her.   4)  Weight/Diet: Her BMI is 29.3.  She is a candidate for some weight loss.  CDE consult in progress, exercise, and carbohydrates information provided.  5)  Vitamin D deficiency,  - She is status post 12 weeks of  vitamin D therapy, 50,000 units weekly x 12 weeks.   6 ) Chronic Care/Health Maintenance:  -Patien is on ACEI and Statin medications and encouraged to continue to follow up with Ophthalmology, Podiatrist at least yearly or according to recommendations, and advised to stay away from smoking. I have recommended yearly flu vaccine and pneumonia vaccination at least every 5 years; and  sleep for at least 7 hours a day.  I advised patient to maintain close follow up with her PCP for primary care needs.   - Time spent on this patient care encounter:  35 min, of which > 50% was spent in  counseling and the rest reviewing her blood glucose logs , discussing her hypoglycemia and hyperglycemia episodes, reviewing her current and  previous labs / studies  ( including abstraction from other facilities) and medications  doses and developing a  long term treatment plan and documenting her care.   Please refer to Patient Instructions for Blood Glucose Monitoring and Insulin/Medications Dosing Guide"  in media tab for  additional information. Please  also refer to " Patient Self Inventory" in the Media  tab for reviewed elements of pertinent patient history.  [name] participated in the discussions, expressed understanding, and voiced agreement with the above plans.  All questions were answered to her satisfaction. she is encouraged to contact clinic should she have any questions or concerns prior to her return visit.   [name] participated in the discussions, expressed understanding, and voiced agreement with the above plans.  All questions were answered to her satisfaction. she is encouraged to contact clinic should she have any questions or concerns prior to her return visit.   Follow up plan: Return in about 2 weeks (around 01/19/2021), or phone visit, for F/U with Meter and Logs Only - no Labs.  Glade Lloyd, MD Phone: [phone]  Fax: [phone]  -  This note was partially dictated with voice recognition software. Similar sounding words can be transcribed inadequately or may not  be corrected upon review.  01/05/2021, 5:32 PM

## 2021-01-06 ENCOUNTER — Other Ambulatory Visit: Payer: Self-pay | Admitting: "Endocrinology

## 2021-01-06 DIAGNOSIS — G4700 Insomnia, unspecified: Secondary | ICD-10-CM | POA: Diagnosis not present

## 2021-01-06 DIAGNOSIS — E782 Mixed hyperlipidemia: Secondary | ICD-10-CM

## 2021-01-06 DIAGNOSIS — E1165 Type 2 diabetes mellitus with hyperglycemia: Secondary | ICD-10-CM

## 2021-01-16 ENCOUNTER — Other Ambulatory Visit: Payer: Self-pay | Admitting: "Endocrinology

## 2021-01-16 DIAGNOSIS — E1165 Type 2 diabetes mellitus with hyperglycemia: Secondary | ICD-10-CM

## 2021-01-19 ENCOUNTER — Telehealth: Payer: Medicare HMO | Admitting: "Endocrinology

## 2021-01-20 DIAGNOSIS — E1129 Type 2 diabetes mellitus with other diabetic kidney complication: Secondary | ICD-10-CM | POA: Diagnosis not present

## 2021-01-20 DIAGNOSIS — I10 Essential (primary) hypertension: Secondary | ICD-10-CM | POA: Diagnosis not present

## 2021-01-21 IMAGING — CT CT HEAD WITHOUT CONTRAST
3 series · 16 of 47 positions shown, 19 images · non-contrast
Comparison: MR head 11/17/2018.

CLINICAL DATA: Slurred speech and LEFT facial droop. Altered mental
status.

EXAM:
CT HEAD WITHOUT CONTRAST
TECHNIQUE: Contiguous axial images were obtained from the base of the skull
through the vertex without intravenous contrast.

[Series 2: head trauma wo · axial · 0.44mm/px · z∈[-13,+112]mm · 10 of 31 slices shown, 13 images]
[im 3/31  brain]
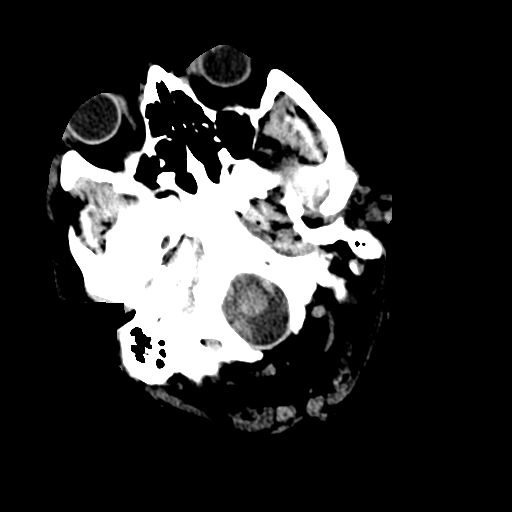
[im 3/31  bone]
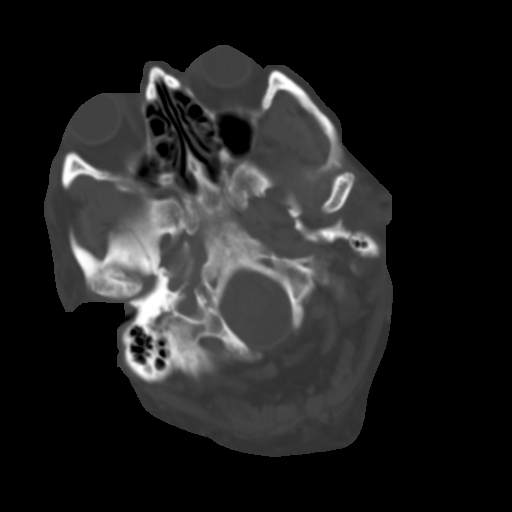
[im 6/31  brain]
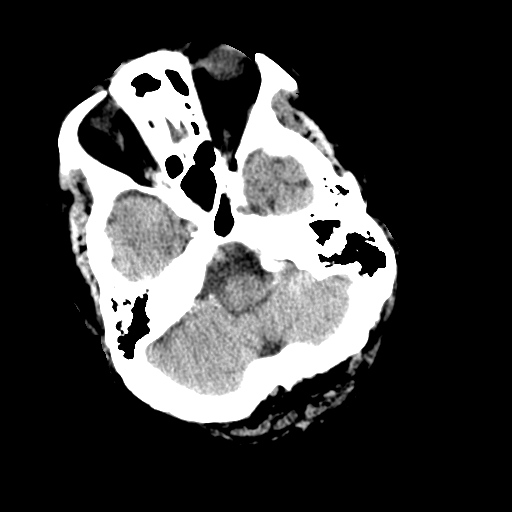
[im 9/31  brain]
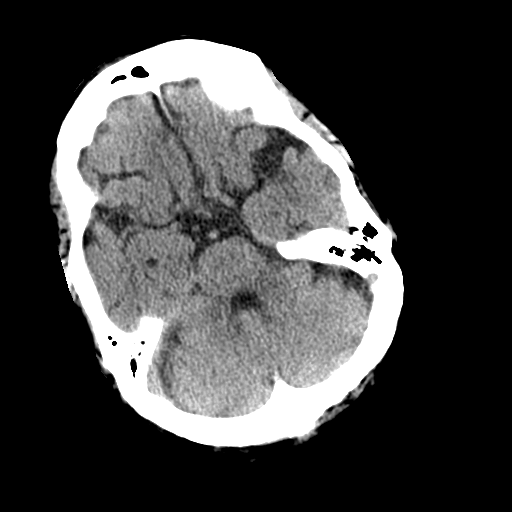
[im 11/31  brain]
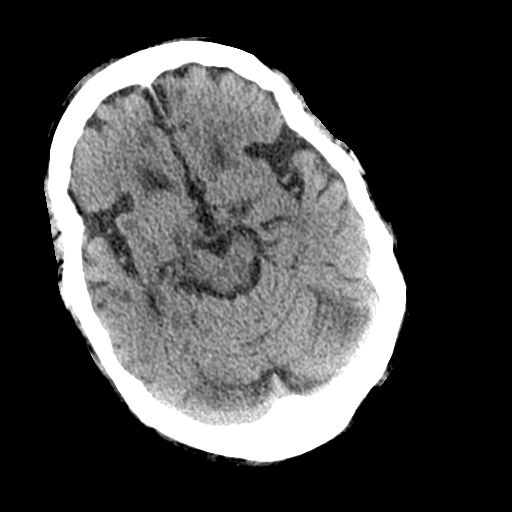
[im 14/31  brain]
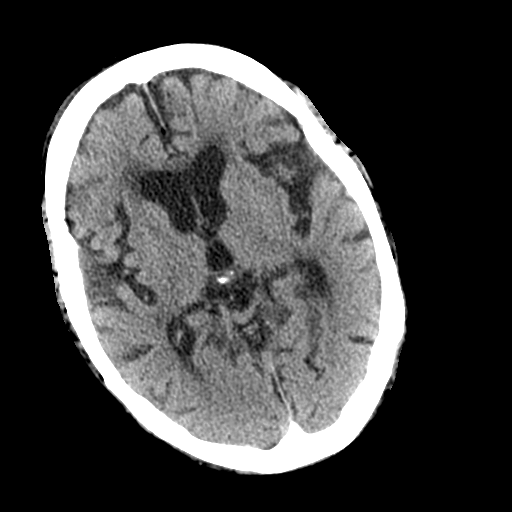
[im 14/31  bone]
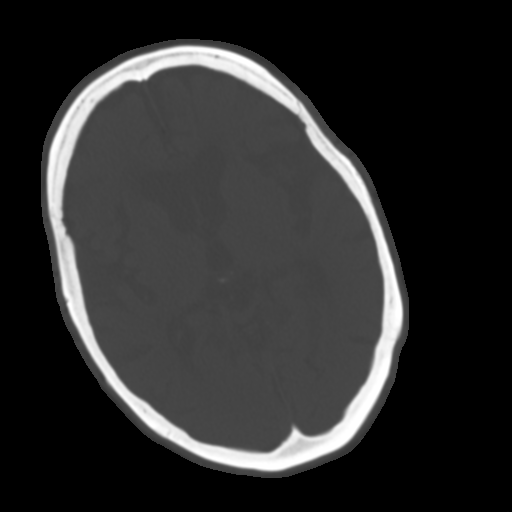
[im 17/31  brain]
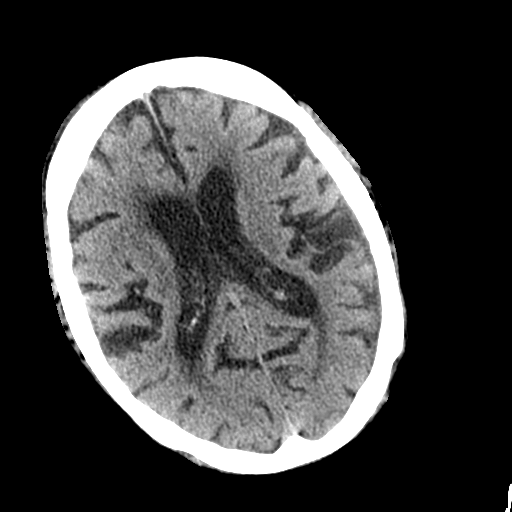
[im 20/31  brain]
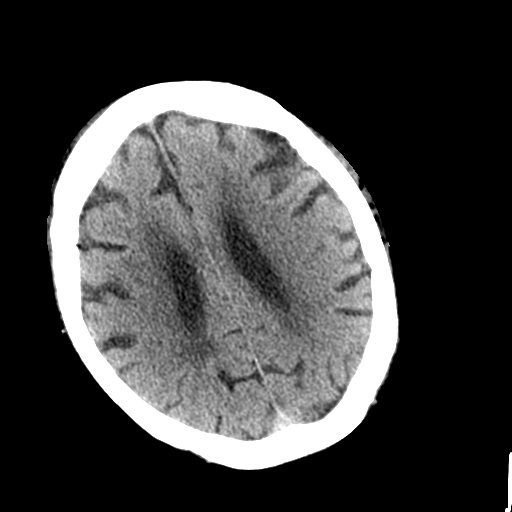
[im 23/31  brain]
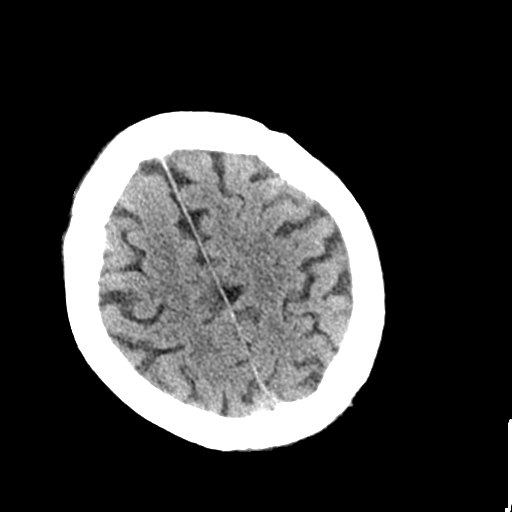
[im 25/31  brain]
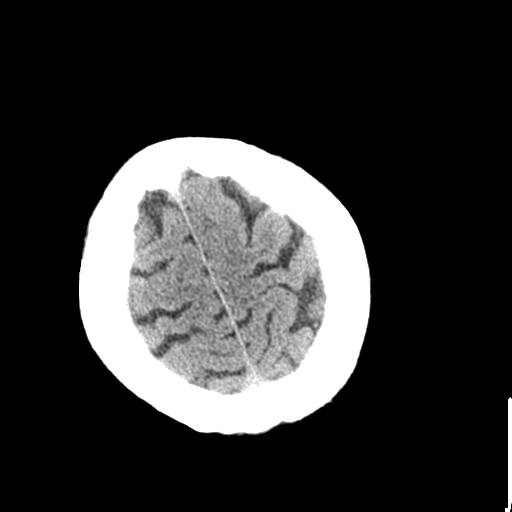
[im 25/31  bone]
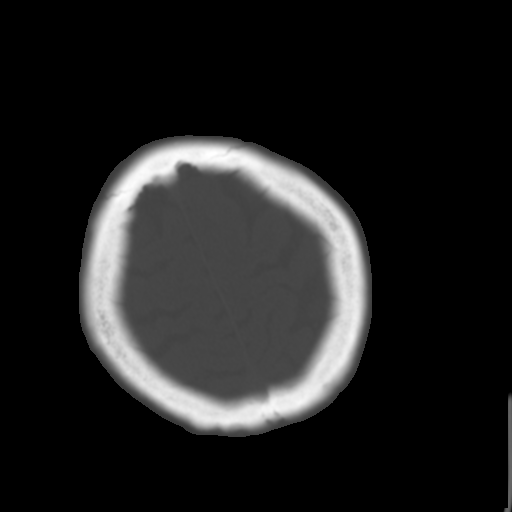
[im 28/31  brain]
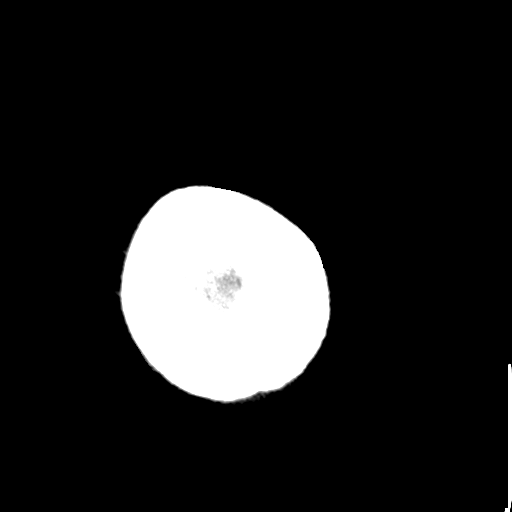

[Series 4: coronal soft tissue · coronal · 0.32mm/px · 3 of 74 slices shown]
[im 25/74  brain]
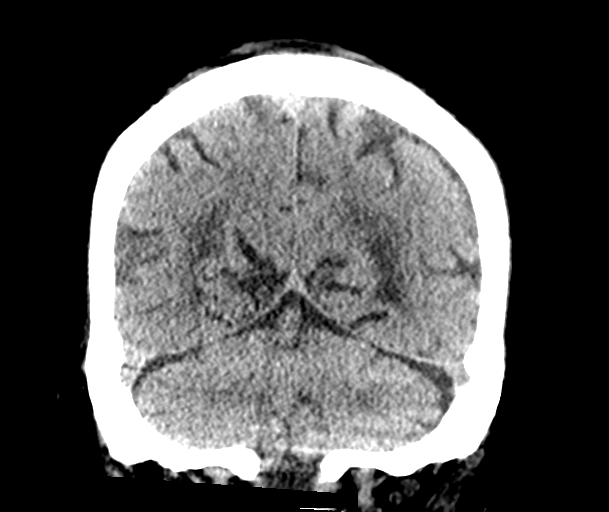
[im 33/74  brain]
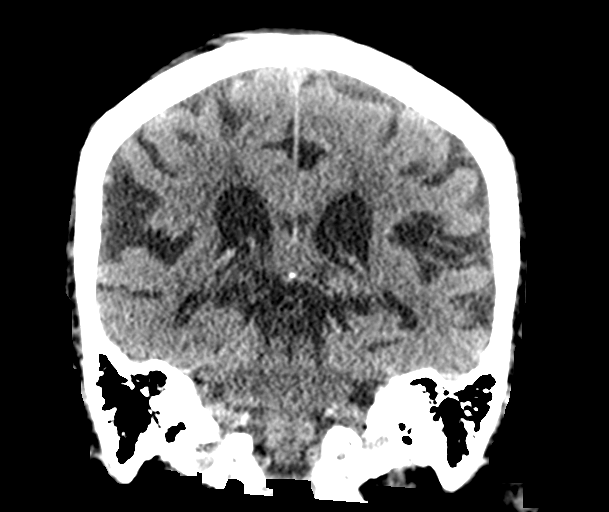
[im 41/74  brain]
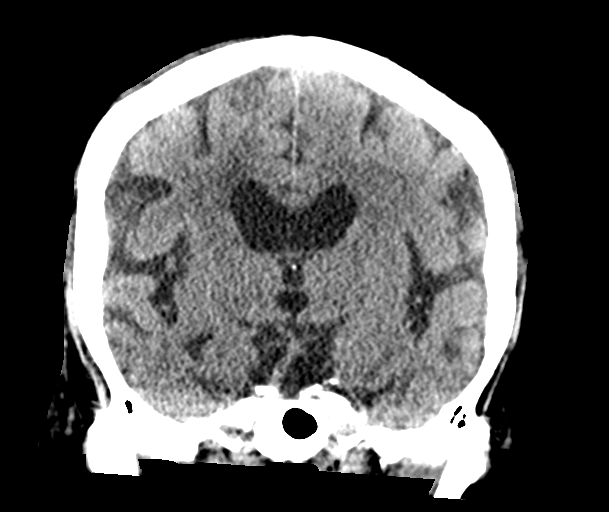

[Series 5: sagittal soft tissue · sagittal · 0.34mm/px · 3 of 60 slices shown]
[im 20/60  brain]
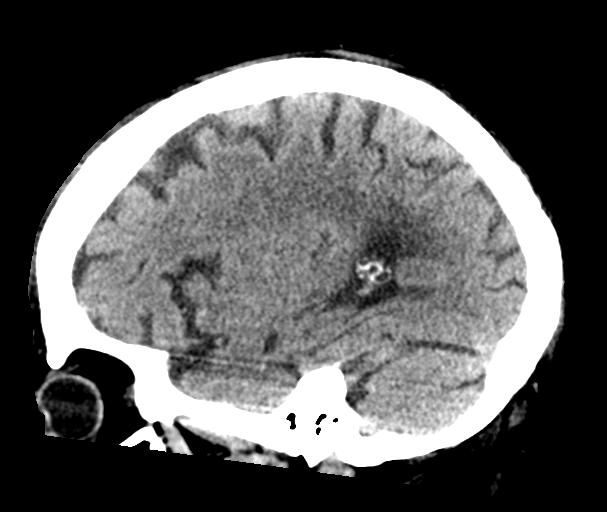
[im 30/60  brain]
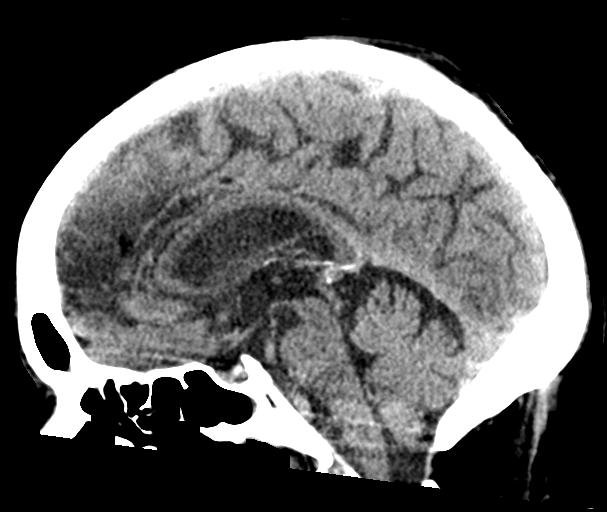
[im 40/60  brain]
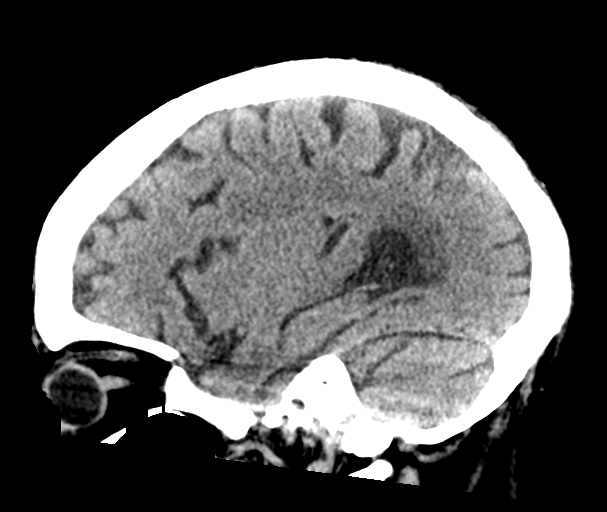

[16 of 47 positions shown; findings below may reference images not displayed]

FINDINGS: Brain: No evidence for acute infarction, hemorrhage, mass lesion,
hydrocephalus, or extra-axial fluid. Advanced atrophy.
Hypoattenuation of white matter, likely small vessel disease.

Vascular: Calcification of the cavernous internal carotid arteries
consistent with cerebrovascular atherosclerotic disease. No signs of
intracranial large vessel occlusion.

Skull: Normal. Negative for fracture or focal lesion.

Sinuses/Orbits: No acute finding.

Other: Compared with priors, similar appearance.
IMPRESSION: Atrophy and small vessel disease similar to priors. No acute
intracranial findings.

## 2021-01-21 IMAGING — MR MRI HEAD WITHOUT CONTRAST
8 of 10 series · 42 of 48 positions shown · non-contrast
Comparison: Head CT 03/24/2019 and MRI 11/17/2018

CLINICAL DATA: Altered mental status.

EXAM:
MRI HEAD WITHOUT CONTRAST
TECHNIQUE: Multiplanar, multiecho pulse sequences of the brain and surrounding
structures were obtained without intravenous contrast.

[Series 3: DWI · axial · 3.0mm · 0.71mm/px · z∈[-119,+43]mm · 7 of 55 slices shown (1 of 4)]
[im 1/55]
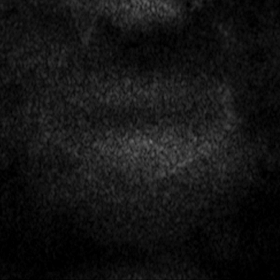
[im 10/55]
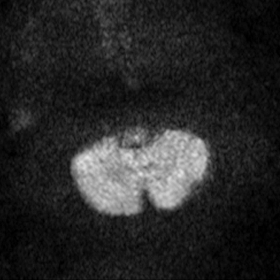
[im 19/55]
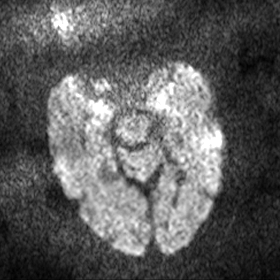
[im 28/55]
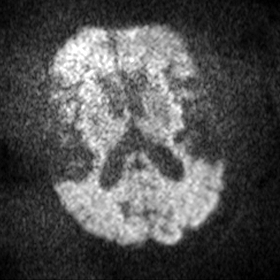
[im 37/55]
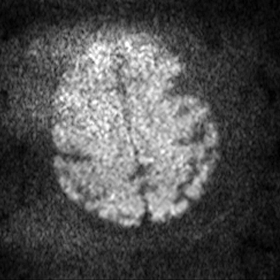
[im 46/55]
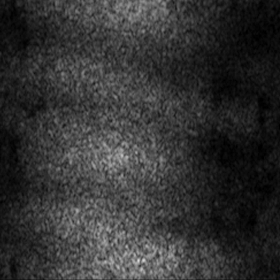
[im 55/55]
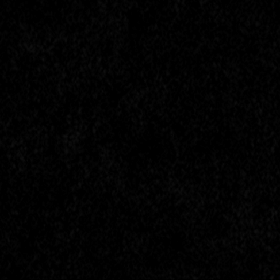

[Series 4: DWI · axial · 3.0mm · 0.77mm/px · z∈[-120,+38]mm · 7 of 54 slices shown (2 of 4)]
[im 1/54]
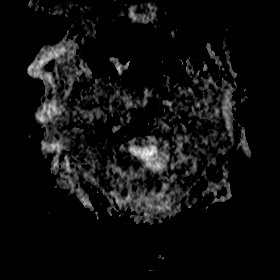
[im 9/54]
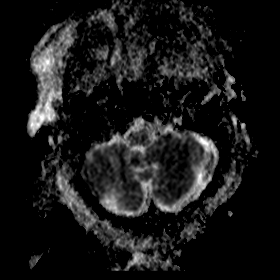
[im 18/54]
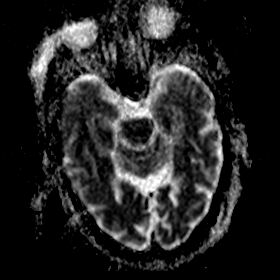
[im 27/54]
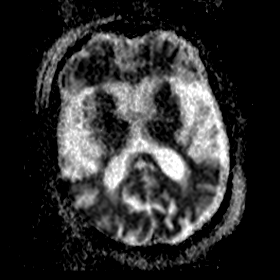
[im 36/54]
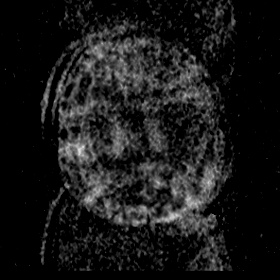
[im 45/54]
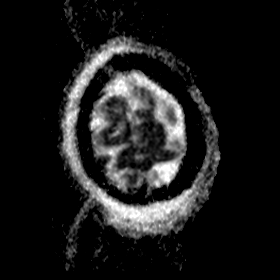
[im 54/54]
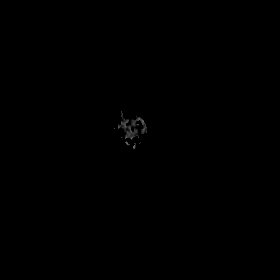

[Series 5: DWI · coronal · 5.0mm · 0.43mm/px · 4 of 34 slices shown (3 of 4)]
[im 1/34]
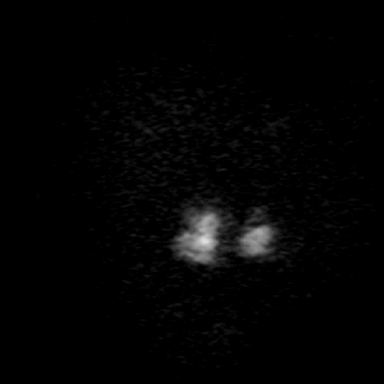
[im 12/34]
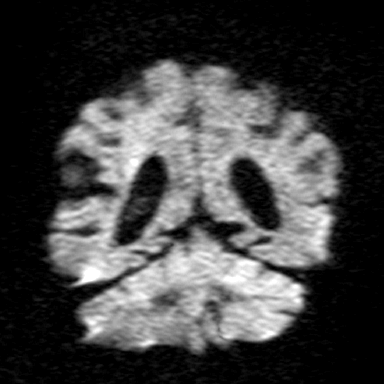
[im 23/34]
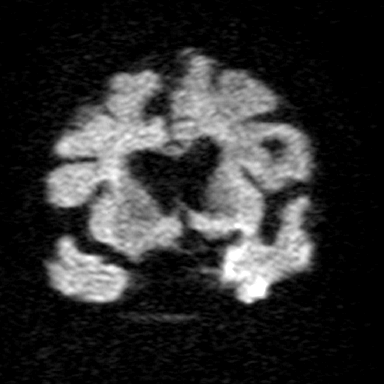
[im 34/34]
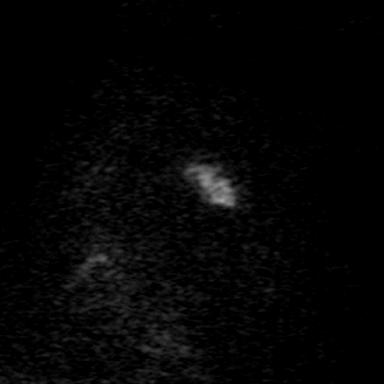

[Series 6: DWI · coronal · 5.0mm · 0.51mm/px · 4 of 34 slices shown (4 of 4)]
[im 1/34]
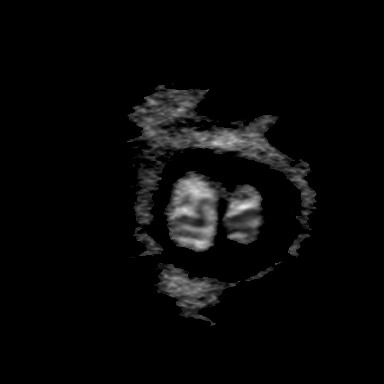
[im 12/34]
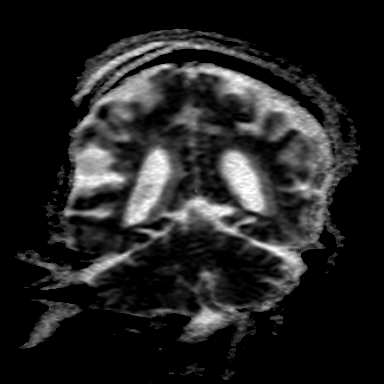
[im 23/34]
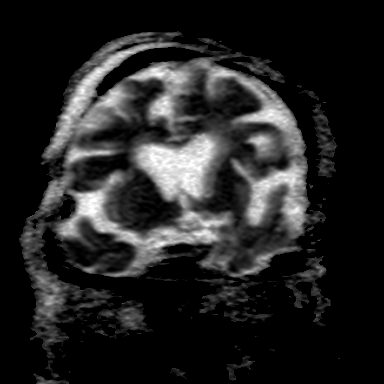
[im 34/34]
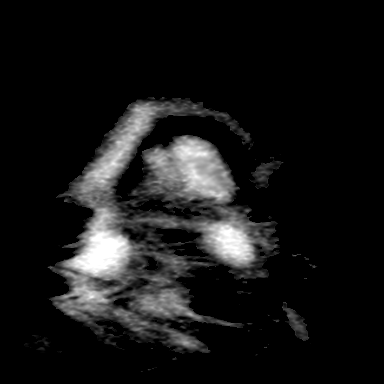

[Series 7: T2 · axial · 5.0mm · 0.69mm/px · z∈[-111,+31]mm · 3 of 23 slices shown (1 of 2)]
[im 1/23]
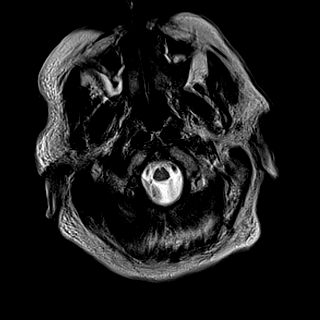
[im 12/23]
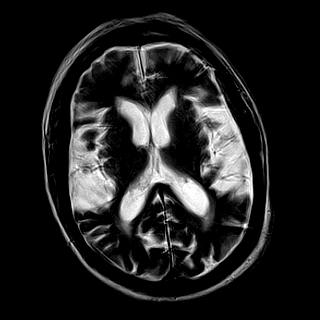
[im 23/23]
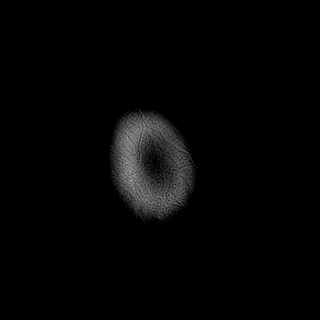

[Series 8: FLAIR · axial · 3.0mm · 0.86mm/px · z∈[-109,+29]mm · 6 of 47 slices shown]
[im 1/47]
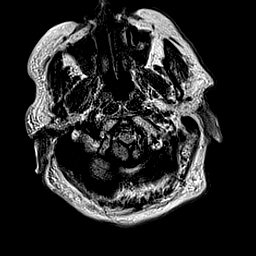
[im 10/47]
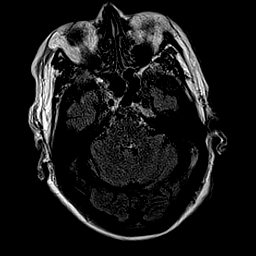
[im 19/47]
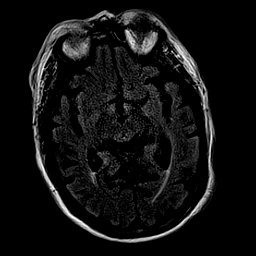
[im 28/47]
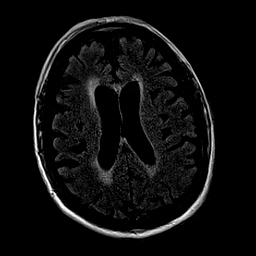
[im 37/47]
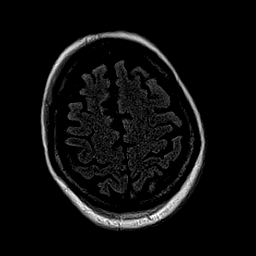
[im 47/47]
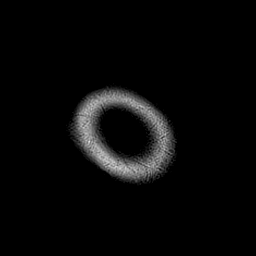

[Series 9: T1 · axial · 2.0mm · 0.44mm/px · z∈[-127,+34]mm · 8 of 82 slices shown]
[im 1/82]
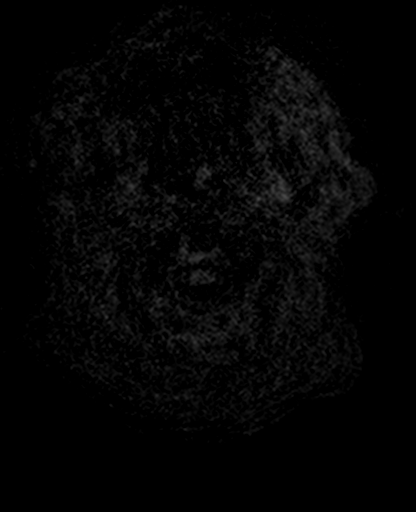
[im 10/82]
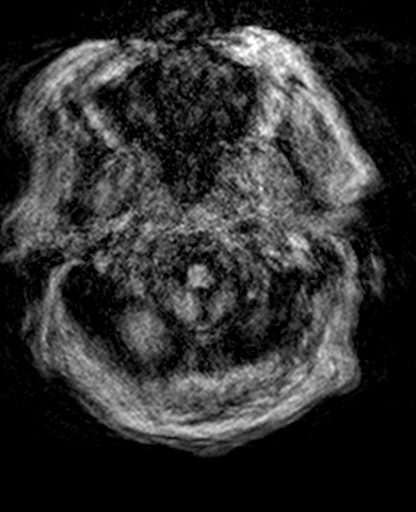
[im 28/82]
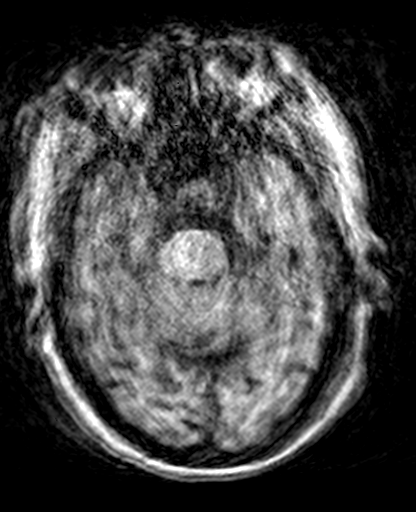
[im 37/82]
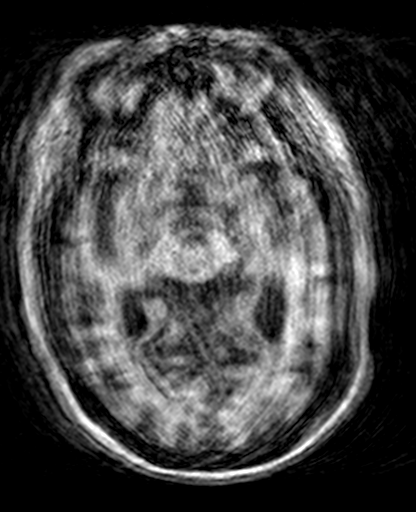
[im 46/82]
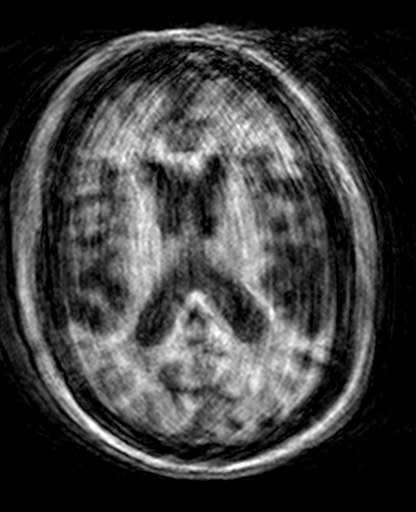
[im 55/82]
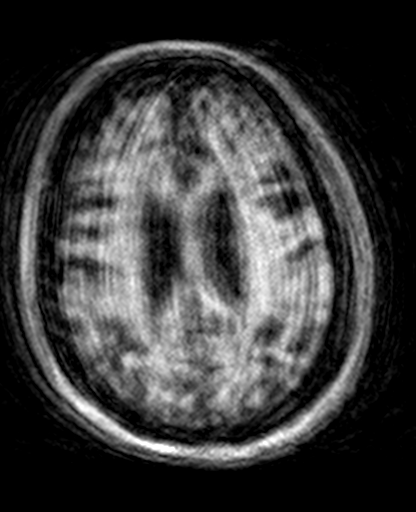
[im 73/82]
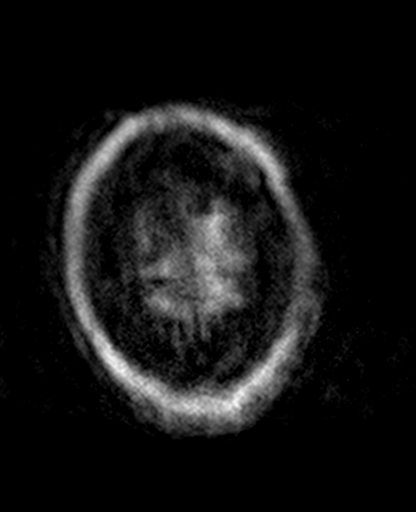
[im 82/82]
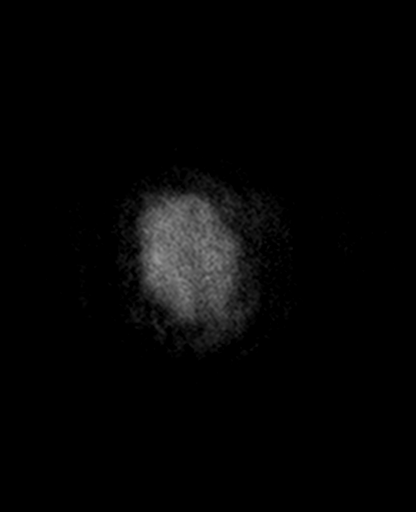

[Series 11: T2 · coronal · 5.0mm · 0.64mm/px · 3 of 28 slices shown (2 of 2)]
[im 1/28]
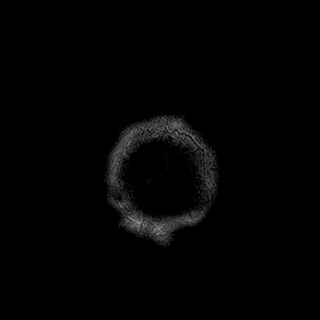
[im 14/28]
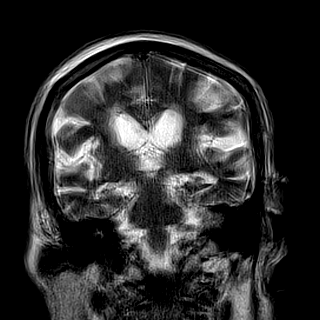
[im 28/28]
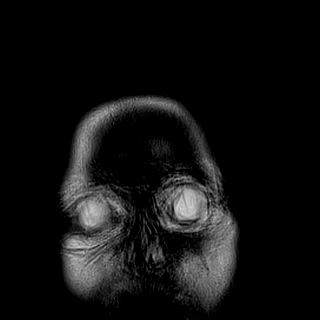

[42 of 48 positions shown; findings below may reference images not displayed]

FINDINGS: The study is motion degraded throughout with some sequences being
moderately to severely degraded.

Brain: Within limitations of motion, no acute infarct or
intracranial hemorrhage is identified. There is no midline shift or
extra-axial fluid collection. Cerebral atrophy is unchanged from the
prior MRI. Periventricular white matter T2 hyperintensities are also
unchanged and nonspecific but compatible with mild chronic small
vessel ischemic disease.

Vascular: Major intracranial vascular flow voids are preserved.

Skull and upper cervical spine: Unremarkable bone marrow signal.

Sinuses/Orbits: Bilateral cataract extraction. Mild scattered
mucosal thickening in the paranasal sinuses. No significant mastoid
fluid.

Other: None.
IMPRESSION: 1. Motion degraded examination without evidence of acute
intracranial abnormality.
2. Mild chronic small vessel ischemic disease.

## 2021-01-26 ENCOUNTER — Telehealth: Payer: Medicare HMO | Admitting: "Endocrinology

## 2021-02-06 ENCOUNTER — Other Ambulatory Visit: Payer: Self-pay | Admitting: "Endocrinology

## 2021-02-06 DIAGNOSIS — E1165 Type 2 diabetes mellitus with hyperglycemia: Secondary | ICD-10-CM

## 2021-02-06 NOTE — Telephone Encounter (Signed)
Please advise 

## 2021-02-09 ENCOUNTER — Encounter: Payer: Self-pay | Admitting: "Endocrinology

## 2021-02-09 ENCOUNTER — Telehealth (INDEPENDENT_AMBULATORY_CARE_PROVIDER_SITE_OTHER): Payer: Medicare HMO | Admitting: "Endocrinology

## 2021-02-09 DIAGNOSIS — E1165 Type 2 diabetes mellitus with hyperglycemia: Secondary | ICD-10-CM | POA: Diagnosis not present

## 2021-02-09 MED ORDER — GLIPIZIDE ER 5 MG PO TB24
ORAL_TABLET | ORAL | 1 refills | Status: DC
Start: 2021-02-09 — End: 2022-07-11

## 2021-02-09 MED ORDER — SITAGLIPTIN PHOSPHATE 50 MG PO TABS: 50.0000 mg | ORAL_TABLET | Freq: Every day | ORAL | 1 refills | Status: DC

## 2021-02-09 MED ORDER — ONETOUCH VERIO VI STRP: ORAL_STRIP | 1 refills | Status: DC

## 2021-02-09 MED ORDER — METFORMIN HCL 500 MG PO TABS: ORAL_TABLET | ORAL | 1 refills | Status: DC

## 2021-02-09 MED ORDER — NOVOLOG MIX 70/30 FLEXPEN (70-30) 100 UNIT/ML ~~LOC~~ SUPN: 60.0000 [IU] | PEN_INJECTOR | Freq: Two times a day (BID) | SUBCUTANEOUS | 1 refills | Status: DC

## 2021-02-09 NOTE — Progress Notes (Signed)
02/09/2021                                                    Endocrinology Telehealth Visit Follow up Note -During COVID -19 Pandemic  This visit type was conducted  via telephone due to national recommendations for restrictions regarding the COVID-19 Pandemic  in an effort to limit this patient's exposure and mitigate transmission of the corona virus.  Due to her co-morbid illnesses, [name] is at  moderate to high risk for complications without adequate follow up.  This format is felt to be most appropriate for her at this time.  I connected with this patient on 02/09/2021   by telephone and verified that I am speaking with the correct person using two identifiers. [place], 26-Dec-1928. she has verbally consented to this visit. I, Glade Lloyd, MD was in my office and patient , [name] , was in her residence. No other persons were with me during the encounter. All issues noted in this document were discussed and addressed. The format was not optimal for physical exam.     Subjective:    Patient ID: [name], female    DOB: 01/04/1929,    Past Medical History:  Diagnosis Date  . Diabetes mellitus without complication ([place])   . Diabetic neuropathy ([place])   . Hypertension   . Stroke ([place])   . Urinary incontinence    Past Surgical History:  Procedure Laterality Date  . ABDOMINAL HYSTERECTOMY    . APPENDECTOMY    . BREAST SURGERY     begnin tumor removed  . CATARACT EXTRACTION, BILATERAL     Social History   Socioeconomic History  . Marital status: Married    Spouse name: Charita Lindenberger  . Number of children: 2  . Years of education: Not on file  . Highest education level: Not on file  Occupational History  . 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 Smoking status: Never Smoker  . Smokeless tobacco: Never Used  Vaping Use  . Vaping Use: Never used  Substance and Sexual Activity  . Alcohol use: No  .  Drug use: No  . Sexual activity: Not on file  Other Topics Concern  . Not on file  Social History Narrative  . Not on file   Social Determinants of Health   Financial Resource Strain: Not on file  Food Insecurity: Not on file  Transportation Needs: Not on file  Physical Activity: Not on file  Stress: Not on file  Social Connections: Not on file   Outpatient Encounter Medications as of 02/09/2021  Medication Sig  . amLODipine (NORVASC) 5 MG tablet Take 1 tablet (5 mg total) by mouth daily.  [name] aspirin 325 MG tablet Take 1 tablet (325 mg total) by mouth daily.  . Cholecalciferol (VITAMIN D3) 5000 units CAPS Take 1 capsule (5,000 Units total) by mouth daily. (Patient taking differently: Take 5,000 Units by mouth every evening.)  . furosemide (LASIX) 40 MG tablet Take 40 mg by mouth daily as needed.  . gabapentin (NEURONTIN) 300 MG capsule TAKE ONE CAPSULE (300MG  TOTAL) BY MOUTH THREE TIMES DAILY  . glipiZIDE (GLUCOTROL XL) 5 MG 24 hr tablet TAKE 1 TABLET BY MOUTH  TWICE DAILY WITH A MEAL  . insulin glargine, 2  Unit Dial, (TOUJEO MAX SOLOSTAR) 300 UNIT/ML Solostar Pen Inject 120 Units into the skin at bedtime.  . Insulin Pen Needle (PEN NEEDLES) 31G X 8 MM MISC Use as directed to inject insulin once daily  . Magnesium 200 MG TABS Take 1 tablet (200 mg total) by mouth daily.  . metFORMIN (GLUCOPHAGE) 500 MG tablet TAKE ONE TABLET (500MG  TOTAL) BY MOUTH TWO TIMES DAILY AFTER A MEAL  . metoprolol succinate (TOPROL-XL) 50 MG 24 hr tablet Take 50 mg by mouth at bedtime. Take with or immediately following a meal.  . mirabegron ER (MYRBETRIQ) 50 MG TB24 tablet Take 1 tablet (50 mg total) by mouth every evening.  . nystatin (MYCOSTATIN/NYSTOP) powder Apply topically 2 (two) times daily. Apply to affected skin for treatment of fungal infection. Please keep area clean and dry.  Glory Rosebush VERIO test strip TEST  3 TIMES DAILY  . simvastatin (ZOCOR) 20 MG tablet TAKE 1 TABLET BY MOUTH IN  THE EVENING   . sitaGLIPtin (JANUVIA) 50 MG tablet Take 1 tablet (50 mg total) by mouth daily.  [name] zolpidem (AMBIEN) 10 MG tablet 10 mg at bedtime as needed.  . [DISCONTINUED] sulfamethoxazole-trimethoprim (BACTRIM DS) 800-160 MG tablet Take 1 tablet by mouth every 12 (twelve) hours.   No facility-administered encounter medications on file as of 02/09/2021.   ALLERGIES: Allergies  Allergen Reactions  . Codeine Nausea And Vomiting   VACCINATION STATUS: Immunization History  Administered Date(s) Administered  . Influenza-Unspecified 10/24/2018    Diabetes She presents for her follow-up diabetic visit. She has type 2 diabetes mellitus. Onset time: She was diagnosed at approximate age of 32 years. Her disease course has been worsening. There are no hypoglycemic associated symptoms. Pertinent negatives for hypoglycemia include no confusion, headaches, pallor or seizures. Associated symptoms include polydipsia and polyuria. Pertinent negatives for diabetes include no chest pain, no fatigue and no polyphagia. There are no hypoglycemic complications. Symptoms are worsening. There are no diabetic complications. Risk factors for coronary artery disease include dyslipidemia, diabetes mellitus and hypertension. Current diabetic treatment includes oral agent (dual therapy). Her weight is fluctuating minimally. She is following a generally unhealthy diet. She has had a previous visit with a dietitian. Her home blood glucose trend is increasing steadily. Her breakfast blood glucose range is generally >200 mg/dl. Her dinner blood glucose range is generally >200 mg/dl. Her bedtime blood glucose range is generally >200 mg/dl. Her overall blood glucose range is >200 mg/dl. (  She reports her glycemic readings for the last 7 days:  Fasting: 207, 178,207,188,185,210, 224 Prelunch  232, 226, 190, 220, 210, 230, 237  Supper 220, 260,240,220,230, 225, 230, 240 Bedtime  She denies hypoglycemia.  Her last A1c of 8.7% in March  2021, overall improving from 12.3%.  He denies hypoglycemia.  Her last A1c of 8.7%, overall improving from 12.3%.   ) An ACE inhibitor/angiotensin II receptor blocker is being taken.  Hyperlipidemia This is a chronic problem. The current episode started more than 1 year ago. Exacerbating diseases include diabetes and obesity. Pertinent negatives include no chest pain, myalgias or shortness of breath. Current antihyperlipidemic treatment includes statins. Risk factors for coronary artery disease include diabetes mellitus, dyslipidemia, hypertension, a sedentary lifestyle, post-menopausal and obesity.  Hypertension This is a chronic problem. The current episode started more than 1 year ago. The problem is controlled. Pertinent negatives include no chest pain, headaches, palpitations or shortness of breath. Risk factors for coronary artery disease include dyslipidemia, diabetes mellitus, family history, obesity, sedentary  lifestyle and post-menopausal state. Past treatments include ACE inhibitors. The current treatment provides moderate improvement.    Review of systems Limited as above.   Objective:    Ht 5\' 9"  (1.753 m)   Wt 199 lb (90.3 kg)   BMI 29.39 kg/m   Wt Readings from Last 3 Encounters:  02/09/21 199 lb (90.3 kg)  01/05/21 199 lb (90.3 kg)  12/22/20 199 lb (90.3 kg)     Physical Exam- Limited   Diabetic Labs (most recent): Lab Results  Component Value Date   HGBA1C 8.7 (H) 03/01/2020   HGBA1C 9.2 (A) 12/01/2019   HGBA1C 9.2 11/30/2019   CMP     Component Value Date/Time   NA 135 02/29/2020 0039   NA 142 09/25/2018 1126   K 3.8 02/29/2020 0039   CL 101 02/29/2020 0039   CO2 25 02/29/2020 0039   GLUCOSE 329 (H) 02/29/2020 0039   BUN 17 02/29/2020 0039   BUN 24 09/25/2018 1126   CREATININE 0.82 02/29/2020 0947   CALCIUM 8.8 (L) 02/29/2020 0039   PROT 6.6 02/29/2020 0039   PROT 6.2 09/25/2018 1126   ALBUMIN 3.3 (L) 02/29/2020 0039   ALBUMIN 3.7 09/25/2018 1126    AST 16 02/29/2020 0039   ALT 13 02/29/2020 0039   ALKPHOS 85 02/29/2020 0039   BILITOT 0.5 02/29/2020 0039   BILITOT 0.6 09/25/2018 1126   GFRNONAA >60 02/29/2020 0947   GFRAA >60 02/29/2020 0947   Lipid Panel     Component Value Date/Time   CHOL 106 03/01/2020 0523   CHOL 120 11/13/2017 1037   TRIG 137 03/01/2020 0523   HDL 39 (L) 03/01/2020 0523   HDL 45 11/13/2017 1037   CHOLHDL 2.7 03/01/2020 0523   VLDL 27 03/01/2020 0523   LDLCALC 40 03/01/2020 0523   LDLCALC 48 11/13/2017 1037   LABVLDL 27 11/13/2017 1037     Assessment & Plan:   1. Uncontrolled type 2 diabetes mellitus with CKD as a complication, without long-term current use of insulin ([place])  After missing her appointment since May 2021, she has progressively lost control of her glycemia.  She reports her glycemic readings for the last 7 days:  Fasting: 207, 178,207,188,185,210, 224 Prelunch  232, 226, 190, 220, 210, 230, 237  Supper 220, 260,240,220,230, 225, 230, 240 Bedtime  She denies hypoglycemia.  Her last A1c of 8.7% in March 2021, overall improving from 12.3%.   - Patient remains at a high risk for more acute and chronic complications of diabetes which include CAD, CVA, CKD, retinopathy, and neuropathy. These are all discussed in detail with the patient.  - I have re-counseled the patient on diet management and weight loss  by adopting a carbohydrate restricted / protein rich  Diet. - Patient is advised to stick to a routine mealtimes to eat 3 meals  a day and avoid unnecessary snacks ( to snack only to correct hypoglycemia).  - she acknowledges that there is a room for improvement in her food and drink choices. - Suggestion is made for her to avoid simple carbohydrates  from her diet including Cakes, Sweet Desserts, Ice Cream, Soda (diet and regular), Sweet Tea, Candies, Chips, Cookies, Store Bought Juices, Alcohol in Excess of  1-2 drinks a day, Artificial Sweeteners,  Coffee Creamer, and "Sugar-free"  Products, Lemonade. This will help patient to have more stable blood glucose profile and potentially avoid unintended weight gain.  - I have approached patient with the following individualized plan to manage diabetes and  patient agrees.   -Given her persistent hyperglycemia despite high-dose basal insulin, she would be considered for premixed insulin twice a day after she uses up her current available Toujeo supplies.   -She is advised to split her Toujeo to 70 units twice daily until she finishes her current supply.   -After that she will be switched to NovoLog 70/30 60 units with breakfast and 60 units with supper for premeal blood glucose readings above 90 mg per DL.    -She is advised to continue monitor blood glucose strictly 4 times a day before meals and at bedtime.    -She is advised to continue metformin 500 mg p.o. twice daily, glipizide 5 mg p.o. twice a day with breakfast and supper.   -She is also on Januvia 50 mg p.o. daily at breakfast.   -Patient is encouraged to call clinic for blood glucose levels less than 70 or above 300 mg /dlx3.  - Patient specific target  for A1c; LDL, HDL, Triglycerides,  were discussed in detail.  2) BP/HTN:  she is advised to home monitor blood pressure and report if > 140/90 on 2 separate readings.   She is advised to continue her current blood pressure medications including lisinopril 20 mg p.o. twice daily.   3) Lipids/HPL: Her LDL is controlled at 48.  She is tolerating and advised to continue simvastatin 20  mg p.o. nightly.  Side effects and precautions discussed with her.   4)  Weight/Diet: Her BMI is 29.3.  She is a candidate for some weight loss.  CDE consult in progress, exercise, and carbohydrates information provided.  5)  Vitamin D deficiency,  - She is status post 12 weeks of  vitamin D therapy, 50,000 units weekly x 12 weeks.   6 ) Chronic Care/Health Maintenance:  -Patien is on ACEI and Statin medications and encouraged to  continue to follow up with Ophthalmology, Podiatrist at least yearly or according to recommendations, and advised to stay away from smoking. I have recommended yearly flu vaccine and pneumonia vaccination at least every 5 years; and  sleep for at least 7 hours a day.  I advised patient to maintain close follow up with her PCP for primary care needs.   - Time spent on this patient care encounter:  40 min, of which > 50% was spent in  counseling and the rest reviewing her blood glucose logs , discussing her hypoglycemia and hyperglycemia episodes, reviewing her current and  previous labs / studies  ( including abstraction from other facilities) and medications  doses and developing a  long term treatment plan and documenting her care.   Please refer to Patient Instructions for Blood Glucose Monitoring and Insulin/Medications Dosing Guide"  in media tab for additional information. Please  also refer to " Patient Self Inventory" in the Media  tab for reviewed elements of pertinent patient history.  [name] participated in the discussions, expressed understanding, and voiced agreement with the above plans.  All questions were answered to her satisfaction. she is encouraged to contact clinic should she have any questions or concerns prior to her return visit.   [name] participated in the discussions, expressed understanding, and voiced agreement with the above plans.  All questions were answered to her satisfaction. she is encouraged to contact clinic should she have any questions or concerns prior to her return visit.   Follow up plan: No follow-ups on file.  Glade Lloyd, MD Phone: [phone]  Fax: [phone]  -  This note was partially dictated with voice recognition software. Similar sounding words can be transcribed inadequately or may not  be corrected upon review.  02/09/2021, 1:34 PM

## 2021-02-09 NOTE — Patient Instructions (Signed)
                                     Advice for Weight Management  -For most of us the best way to lose weight is by diet management. Generally speaking, diet management means consuming less calories intentionally which over time brings about progressive weight loss.  This can be achieved more effectively by restricting carbohydrate consumption to the minimum possible.  So, it is critically important to know your numbers: how much calorie you are consuming and how much calorie you need. More importantly, our carbohydrates sources should be unprocessed or minimally processed complex starch food items.   Sometimes, it is important to balance nutrition by increasing protein intake (animal or plant source), fruits, and vegetables.  -Sticking to a routine mealtime to eat 3 meals a day and avoiding unnecessary snacks is shown to have a big role in weight control. Under normal circumstances, the only time we lose real weight is when we are hungry, so allow hunger to take place- hunger means no food between meal times, only water.  It is not advisable to starve.   -It is better to avoid simple carbohydrates including: Cakes, Sweet Desserts, Ice Cream, Soda (diet and regular), Sweet Tea, Candies, Chips, Cookies, Store Bought Juices, Alcohol in Excess of  1-2 drinks a day, Lemonade,  Artificial Sweeteners, Doughnuts, Coffee Creamers, "Sugar-free" Products, etc, etc.  This is not a complete list.....    -Consulting with certified diabetes educators is proven to provide you with the most accurate and current information on diet.  Also, you may be  interested in discussing diet options/exchanges , we can schedule a visit with [name], RDN, CDE for individualized nutrition education.  -Exercise: If you are able: 30 -60 minutes a day ,4 days a week, or 150 minutes a week.  The longer the better.  Combine stretch, strength, and aerobic activities.  If you were told in the  past that you have high risk for cardiovascular diseases, you may seek evaluation by your heart doctor prior to initiating moderate to intense exercise programs.                                  Additional Care Considerations for Diabetes   -Diabetes  is a chronic disease.  The most important care consideration is regular follow-up with your diabetes care provider with the goal being avoiding or delaying its complications and to take advantage of advances in medications and technology.    -Type 2 diabetes is known to coexist with other important comorbidities such as high blood pressure and high cholesterol.  It is critical to control not only the diabetes but also the high blood pressure and high cholesterol to minimize and delay the risk of complications including coronary artery disease, stroke, amputations, blindness, etc.    - Studies showed that people with diabetes will benefit from a class of medications known as ACE inhibitors and statins.  Unless there are specific reasons not to be on these medications, the standard of care is to consider getting one from these groups of medications at an optimal doses.  These medications are generally considered safe and proven to help protect the heart and the kidneys.    - People with diabetes are encouraged to initiate and maintain regular follow-up with eye doctors, foot   doctors, dentists , and if necessary heart and kidney doctors.     - It is highly recommended that people with diabetes quit smoking or stay away from smoking, and get yearly  flu vaccine and pneumonia vaccine at least every 5 years.  One other important lifestyle recommendation is to ensure adequate sleep - at least 6-7 hours of uninterrupted sleep at night.  -Exercise: If you are able: 30 -60 minutes a day, 4 days a week, or 150 minutes a week.  The longer the better.  Combine stretch, strength, and aerobic activities.  If you were told in the past that you have high risk for  cardiovascular diseases, you may seek evaluation by your heart doctor prior to initiating moderate to intense exercise programs.          

## 2021-02-10 ENCOUNTER — Other Ambulatory Visit: Payer: Self-pay

## 2021-02-10 DIAGNOSIS — E1165 Type 2 diabetes mellitus with hyperglycemia: Secondary | ICD-10-CM

## 2021-02-10 MED ORDER — BLOOD GLUCOSE MONITOR KIT: PACK | 0 refills | Status: DC

## 2021-03-06 ENCOUNTER — Other Ambulatory Visit: Payer: Self-pay | Admitting: "Endocrinology

## 2021-03-06 DIAGNOSIS — E1165 Type 2 diabetes mellitus with hyperglycemia: Secondary | ICD-10-CM

## 2021-03-06 NOTE — Telephone Encounter (Signed)
Please advise 

## 2021-03-20 ENCOUNTER — Ambulatory Visit: Payer: Medicare Other | Admitting: Orthopedic Surgery

## 2021-04-03 ENCOUNTER — Other Ambulatory Visit: Payer: Self-pay | Admitting: "Endocrinology

## 2021-04-03 DIAGNOSIS — E1165 Type 2 diabetes mellitus with hyperglycemia: Secondary | ICD-10-CM

## 2021-04-13 LAB — BASIC METABOLIC PANEL
BUN: 13 (ref 4–21)
CO2: 22 (ref 13–22)
Chloride: 99 (ref 99–108)
Creatinine: 0.7 (ref 0.5–1.1)
Glucose: 188
Potassium: 4 (ref 3.4–5.3)
Sodium: 139 (ref 137–147)

## 2021-04-13 LAB — COMPREHENSIVE METABOLIC PANEL
Albumin: 4.2 (ref 3.5–5.0)
Calcium: 9.8 (ref 8.7–10.7)
Globulin: 2.8

## 2021-04-13 LAB — HEPATIC FUNCTION PANEL
ALT: 12 (ref 7–35)
AST: 17 (ref 13–35)
Alkaline Phosphatase: 90 (ref 25–125)
Bilirubin, Total: 0.6

## 2021-04-13 LAB — VITAMIN D 25 HYDROXY (VIT D DEFICIENCY, FRACTURES): Vit D, 25-Hydroxy: 61

## 2021-04-13 LAB — TSH: TSH: 4.04 (ref 0.41–5.90)

## 2021-04-14 LAB — LIPID PANEL
Cholesterol: 141 (ref 0–200)
HDL: 52 (ref 35–70)
LDL Cholesterol: 64
Triglycerides: 144 (ref 40–160)

## 2021-04-20 ENCOUNTER — Ambulatory Visit: Payer: Medicare HMO | Admitting: "Endocrinology

## 2021-04-24 ENCOUNTER — Ambulatory Visit (INDEPENDENT_AMBULATORY_CARE_PROVIDER_SITE_OTHER): Payer: Medicare HMO | Admitting: Orthopedic Surgery

## 2021-04-24 ENCOUNTER — Other Ambulatory Visit: Payer: Self-pay

## 2021-04-24 ENCOUNTER — Encounter: Payer: Self-pay | Admitting: Orthopedic Surgery

## 2021-04-24 VITALS — BP 153/70 | HR 64 | Ht 69.0 in | Wt 199.0 lb

## 2021-04-24 DIAGNOSIS — M1712 Unilateral primary osteoarthritis, left knee: Secondary | ICD-10-CM

## 2021-04-24 DIAGNOSIS — G8929 Other chronic pain: Secondary | ICD-10-CM

## 2021-04-24 DIAGNOSIS — M1711 Unilateral primary osteoarthritis, right knee: Secondary | ICD-10-CM | POA: Diagnosis not present

## 2021-04-24 NOTE — Progress Notes (Signed)
Chief Complaint  Patient presents with  . Injections    Bilateral knees    Patient presents for bilateral knee injections at her request  Encounter Diagnoses  Name Primary?  . Primary osteoarthritis of left knee Yes  . Primary osteoarthritis of right knee   . Chronic pain of both knees     Procedure note for bilateral knee injections  Procedure note left knee injection verbal consent was obtained to inject left knee joint  Timeout was completed to confirm the site of injection  The medications used were 6 mg Celestone with Sensorcaine Anesthesia was provided by ethyl chloride and the skin was prepped with alcohol.  After cleaning the skin with alcohol a 20-gauge needle was used to inject the left knee joint. There were no complications. A sterile bandage was applied.   Procedure note right knee injection verbal consent was obtained to inject right knee joint  Timeout was completed to confirm the site of injection  The medications used were 6 mg Celestone with Sensorcaine Anesthesia was provided by ethyl chloride and the skin was prepped with alcohol.  After cleaning the skin with alcohol a 20-gauge needle was used to inject the right knee joint. There were no complications. A sterile bandage was applied.

## 2021-04-27 ENCOUNTER — Ambulatory Visit: Payer: Medicare Other | Admitting: "Endocrinology

## 2021-05-02 ENCOUNTER — Ambulatory Visit: Payer: Medicare Other | Admitting: "Endocrinology

## 2021-05-08 ENCOUNTER — Other Ambulatory Visit: Payer: Self-pay | Admitting: "Endocrinology

## 2021-05-08 ENCOUNTER — Telehealth: Payer: Self-pay

## 2021-05-08 DIAGNOSIS — E1165 Type 2 diabetes mellitus with hyperglycemia: Secondary | ICD-10-CM

## 2021-05-08 NOTE — Telephone Encounter (Signed)
Spoke with patient - she said she will find a new dr then & patient disconnected phone.

## 2021-05-08 NOTE — Telephone Encounter (Signed)
Left a message requesting a return call to the office. 

## 2021-05-08 NOTE — Telephone Encounter (Signed)
This medication was refilled on April 11 for 90 days. She needs to come for f/u for further refills.

## 2021-05-08 NOTE — Telephone Encounter (Signed)
Patient is requesting refill on gabapentin (NEURONTIN) 300 MG capsule. [place], [place]

## 2021-05-10 DIAGNOSIS — L821 Other seborrheic keratosis: Secondary | ICD-10-CM | POA: Diagnosis not present

## 2021-05-10 DIAGNOSIS — L570 Actinic keratosis: Secondary | ICD-10-CM | POA: Diagnosis not present

## 2021-07-10 DIAGNOSIS — L570 Actinic keratosis: Secondary | ICD-10-CM | POA: Diagnosis not present

## 2021-07-10 DIAGNOSIS — L259 Unspecified contact dermatitis, unspecified cause: Secondary | ICD-10-CM | POA: Diagnosis not present

## 2021-07-10 DIAGNOSIS — L821 Other seborrheic keratosis: Secondary | ICD-10-CM | POA: Diagnosis not present

## 2021-07-19 ENCOUNTER — Telehealth: Payer: Self-pay

## 2021-07-19 NOTE — Telephone Encounter (Signed)
Patient needing samples of mirabegron ER (MYRBETRIQ) 50 MG TB24 tablet  Please call pt back at [phone]  Thanks, Helene Kelp

## 2021-07-19 NOTE — Telephone Encounter (Signed)
Patient notified that the samples will be up front for her to pick up.

## 2021-07-24 ENCOUNTER — Ambulatory Visit: Payer: Medicare Other | Admitting: Orthopedic Surgery

## 2021-07-26 DIAGNOSIS — Z794 Long term (current) use of insulin: Secondary | ICD-10-CM | POA: Diagnosis not present

## 2021-07-26 DIAGNOSIS — R2689 Other abnormalities of gait and mobility: Secondary | ICD-10-CM | POA: Diagnosis not present

## 2021-07-26 DIAGNOSIS — I10 Essential (primary) hypertension: Secondary | ICD-10-CM | POA: Diagnosis not present

## 2021-07-26 DIAGNOSIS — Z6830 Body mass index (BMI) 30.0-30.9, adult: Secondary | ICD-10-CM | POA: Diagnosis not present

## 2021-07-26 DIAGNOSIS — G629 Polyneuropathy, unspecified: Secondary | ICD-10-CM | POA: Diagnosis not present

## 2021-07-26 DIAGNOSIS — I6930 Unspecified sequelae of cerebral infarction: Secondary | ICD-10-CM | POA: Diagnosis not present

## 2021-07-26 DIAGNOSIS — E1169 Type 2 diabetes mellitus with other specified complication: Secondary | ICD-10-CM | POA: Diagnosis not present

## 2021-08-12 ENCOUNTER — Other Ambulatory Visit: Payer: Self-pay | Admitting: "Endocrinology

## 2021-08-18 ENCOUNTER — Encounter: Payer: Self-pay | Admitting: Orthopedic Surgery

## 2021-08-22 DIAGNOSIS — Z681 Body mass index (BMI) 19 or less, adult: Secondary | ICD-10-CM | POA: Diagnosis not present

## 2021-08-22 DIAGNOSIS — G4700 Insomnia, unspecified: Secondary | ICD-10-CM | POA: Diagnosis not present

## 2021-08-22 DIAGNOSIS — Z1331 Encounter for screening for depression: Secondary | ICD-10-CM | POA: Diagnosis not present

## 2021-08-24 ENCOUNTER — Telehealth: Payer: Self-pay | Admitting: Urology

## 2021-08-24 ENCOUNTER — Ambulatory Visit: Payer: Medicare HMO | Admitting: Orthopedic Surgery

## 2021-08-24 NOTE — Telephone Encounter (Signed)
Patient needing samples of mirabegron ER (MYRBETRIQ) 50 MG TB24 tablet  Please call pt when you can.

## 2021-08-24 NOTE — Telephone Encounter (Signed)
Called patient. No answer. Mailbox full. Samples not available in office at this time.

## 2021-08-29 ENCOUNTER — Other Ambulatory Visit: Payer: Self-pay

## 2021-08-29 DIAGNOSIS — N3281 Overactive bladder: Secondary | ICD-10-CM

## 2021-08-29 MED ORDER — MIRABEGRON ER 50 MG PO TB24: 50.0000 mg | ORAL_TABLET | Freq: Every evening | ORAL | 2 refills | Status: DC

## 2021-08-29 NOTE — Telephone Encounter (Signed)
Patient calling back to check on free samples of MYRBETRIQ) 50 MG TB24 tablet.  She also needs the Rx called into her pharmacy.  Thanks, [name]

## 2021-08-29 NOTE — Telephone Encounter (Signed)
Spoke with patient. Patient scheduled for 1 yr f/u and medication refilled until then.

## 2021-08-30 DIAGNOSIS — E1169 Type 2 diabetes mellitus with other specified complication: Secondary | ICD-10-CM | POA: Diagnosis not present

## 2021-08-30 DIAGNOSIS — Z6830 Body mass index (BMI) 30.0-30.9, adult: Secondary | ICD-10-CM | POA: Diagnosis not present

## 2021-08-30 DIAGNOSIS — Z794 Long term (current) use of insulin: Secondary | ICD-10-CM | POA: Diagnosis not present

## 2021-08-30 DIAGNOSIS — G629 Polyneuropathy, unspecified: Secondary | ICD-10-CM | POA: Diagnosis not present

## 2021-08-30 DIAGNOSIS — R2689 Other abnormalities of gait and mobility: Secondary | ICD-10-CM | POA: Diagnosis not present

## 2021-08-30 DIAGNOSIS — I6930 Unspecified sequelae of cerebral infarction: Secondary | ICD-10-CM | POA: Diagnosis not present

## 2021-08-30 DIAGNOSIS — I10 Essential (primary) hypertension: Secondary | ICD-10-CM | POA: Diagnosis not present

## 2021-09-14 ENCOUNTER — Ambulatory Visit: Payer: Medicare HMO | Admitting: Orthopedic Surgery

## 2021-09-21 DIAGNOSIS — Z23 Encounter for immunization: Secondary | ICD-10-CM | POA: Diagnosis not present

## 2021-09-25 ENCOUNTER — Other Ambulatory Visit: Payer: Self-pay

## 2021-09-25 ENCOUNTER — Ambulatory Visit (INDEPENDENT_AMBULATORY_CARE_PROVIDER_SITE_OTHER): Payer: Medicare HMO | Admitting: Orthopedic Surgery

## 2021-09-25 ENCOUNTER — Encounter: Payer: Self-pay | Admitting: Orthopedic Surgery

## 2021-09-25 DIAGNOSIS — M25562 Pain in left knee: Secondary | ICD-10-CM

## 2021-09-25 DIAGNOSIS — Z794 Long term (current) use of insulin: Secondary | ICD-10-CM | POA: Insufficient documentation

## 2021-09-25 DIAGNOSIS — M170 Bilateral primary osteoarthritis of knee: Secondary | ICD-10-CM | POA: Diagnosis not present

## 2021-09-25 DIAGNOSIS — G8929 Other chronic pain: Secondary | ICD-10-CM

## 2021-09-25 DIAGNOSIS — I6990 Unspecified sequelae of unspecified cerebrovascular disease: Secondary | ICD-10-CM | POA: Insufficient documentation

## 2021-09-25 DIAGNOSIS — M1711 Unilateral primary osteoarthritis, right knee: Secondary | ICD-10-CM

## 2021-09-25 DIAGNOSIS — M1712 Unilateral primary osteoarthritis, left knee: Secondary | ICD-10-CM

## 2021-09-25 DIAGNOSIS — R2689 Other abnormalities of gait and mobility: Secondary | ICD-10-CM | POA: Insufficient documentation

## 2021-09-25 NOTE — Progress Notes (Signed)
Chief Complaint  Patient presents with   Injections    Bilateral knees    Encounter Diagnoses  Name Primary?   Primary osteoarthritis of left knee Yes   Primary osteoarthritis of right knee    Chronic pain of both knees      [name] request bilateral knee injections  Right knee Patient gave consent for injection of both knees Medication Celestone 6 mg lidocaine 1% Technique: Knee in flexion lateral approach 20-gauge needle alcohol plus ethyl chloride injection given no complications  Left knee procedure same procedure as above for left knee

## 2021-10-03 DIAGNOSIS — L03031 Cellulitis of right toe: Secondary | ICD-10-CM | POA: Diagnosis not present

## 2021-10-03 DIAGNOSIS — L97512 Non-pressure chronic ulcer of other part of right foot with fat layer exposed: Secondary | ICD-10-CM | POA: Diagnosis not present

## 2021-10-10 ENCOUNTER — Other Ambulatory Visit: Payer: Self-pay | Admitting: "Endocrinology

## 2021-10-23 DIAGNOSIS — Z6830 Body mass index (BMI) 30.0-30.9, adult: Secondary | ICD-10-CM | POA: Diagnosis not present

## 2021-10-23 DIAGNOSIS — Z794 Long term (current) use of insulin: Secondary | ICD-10-CM | POA: Diagnosis not present

## 2021-10-23 DIAGNOSIS — I6930 Unspecified sequelae of cerebral infarction: Secondary | ICD-10-CM | POA: Diagnosis not present

## 2021-10-23 DIAGNOSIS — I10 Essential (primary) hypertension: Secondary | ICD-10-CM | POA: Diagnosis not present

## 2021-10-23 DIAGNOSIS — E1169 Type 2 diabetes mellitus with other specified complication: Secondary | ICD-10-CM | POA: Diagnosis not present

## 2021-10-23 DIAGNOSIS — G629 Polyneuropathy, unspecified: Secondary | ICD-10-CM | POA: Diagnosis not present

## 2021-10-23 DIAGNOSIS — R2689 Other abnormalities of gait and mobility: Secondary | ICD-10-CM | POA: Diagnosis not present

## 2021-10-25 ENCOUNTER — Ambulatory Visit (INDEPENDENT_AMBULATORY_CARE_PROVIDER_SITE_OTHER): Payer: Medicare HMO | Admitting: Urology

## 2021-10-25 ENCOUNTER — Other Ambulatory Visit: Payer: Self-pay

## 2021-10-25 ENCOUNTER — Encounter: Payer: Self-pay | Admitting: Urology

## 2021-10-25 VITALS — BP 175/75 | HR 65 | Temp 98.6°F

## 2021-10-25 DIAGNOSIS — N3281 Overactive bladder: Secondary | ICD-10-CM | POA: Diagnosis not present

## 2021-10-25 LAB — URINALYSIS, ROUTINE W REFLEX MICROSCOPIC
Bilirubin, UA: NEGATIVE
Ketones, UA: NEGATIVE
Leukocytes,UA: NEGATIVE
Nitrite, UA: NEGATIVE
Specific Gravity, UA: 1.025 (ref 1.005–1.030)
Urobilinogen, Ur: 1 mg/dL (ref 0.2–1.0)
pH, UA: 5 (ref 5.0–7.5)

## 2021-10-25 LAB — MICROSCOPIC EXAMINATION: Renal Epithel, UA: NONE SEEN /hpf

## 2021-10-25 NOTE — Patient Instructions (Signed)

## 2021-10-25 NOTE — Progress Notes (Signed)
post void residual = 0 ml

## 2021-10-25 NOTE — Progress Notes (Signed)
10/25/2021 2:50 PM   [name] 1929-04-22 [id number]  Referring provider: Sharilyn Sites, MD [address] [place],  [place] [address]  Followup OAB   HPI: [name] is a 87yo her for followup for OAB. She is doing well on mirabegron 38m daily. She has rare incontinent episodes. Nocturia 1x. She has rare urinary frequency and urgency. No other complaints today.    PMH: Past Medical History:  Diagnosis Date   Di[place] ([place]    Diabetic neuropathy ([place]    Hypertension    Stroke ([facility]    Urinary incontinence     Surgical History: Past Surgical History:  Procedure Laterality Date   ABDOMINAL HYSTERECTOMY     APPENDECTOMY     BREAST SURGERY     begnin tumor removed   CATARACT EXTRACTION, BILATERAL      Home Medications:  Allergies as of 10/25/2021       Reactions   Codeine Nausea And Vomiting        Medication List        Accurate as of October 25, 2021  2:50 PM. If you have any questions, ask your nurse or doctor.          amLODipine 5 MG tablet Commonly known as: NORVASC Take 1 tablet (5 mg total) by mouth daily.   aspirin 325 MG tablet Take 1 tablet (325 mg total) by mouth daily.   blood glucose meter kit and supplies Kit Dispense based on patient and insurance preference. Use to TEST BLOOD GLUCOSE THREE times daily as directed. DX E11.65   furosemide 40 MG tablet Commonly known as: LASIX Take 40 mg by mouth daily as needed.   gabapentin 300 MG capsule Commonly known as: NEURONTIN TAKE ONE CAPSULE (300MG TOTAL) BY MOUTH THREE TIMES DAILY   glipiZIDE 5 MG 24 hr tablet Commonly known as: GLUCOTROL XL TAKE 1 TABLET BY MOUTH  TWICE DAILY WITH A MEAL   Magnesium 200 MG Tabs Take 1 tablet (200 mg total) by mouth daily.   metFORMIN 500 MG tablet Commonly known as: GLUCOPHAGE TAKE ONE TABLET (500MG TOTAL) BY MOUTH TWO TIMES DAILY AFTER A MEAL   metoprolol succinate 50 MG 24 hr tablet Commonly  known as: TOPROL-XL Take 50 mg by mouth at bedtime. Take with or immediately following a meal.   mirabegron ER 50 MG Tb24 tablet Commonly known as: Myrbetriq Take 1 tablet (50 mg total) by mouth every evening.   NovoLOG Mix 70/30 FlexPen (70-30) 100 UNIT/ML FlexPen Generic drug: insulin aspart protamine - aspart Inject 0.6 mLs (60 Units total) into the skin 2 (two) times daily before a meal.   nystatin powder Commonly known as: MYCOSTATIN/NYSTOP Apply topically 2 (two) times daily. Apply to affected skin for treatment of fungal infection. Please keep area clean and dry.   Pen Needles 31G X 8 MM Misc Use as directed to inject insulin once daily   simvastatin 20 MG tablet Commonly known as: ZOCOR TAKE 1 TABLET BY MOUTH IN  THE EVENING   sitaGLIPtin 50 MG tablet Commonly known as: Januvia Take 1 tablet (50 mg total) by mouth daily.   Vitamin D3 125 MCG (5000 UT) Caps Take 1 capsule (5,000 Units total) by mouth daily. What changed: when to take this   zolpidem 10 MG tablet Commonly known as: AMBIEN 10 mg at bedtime as needed.        Allergies:  Allergies  Allergen Reactions   Codeine Nausea And Vomiting  Family History: Family History  Problem Relation Age of Onset   CAD Mother    Hypertension Mother    Stroke Father    Heart attack Brother    Cancer Brother     Social History:  reports that she has never smoked. She has never used smokeless tobacco. She reports that she does not drink alcohol and does not use drugs.  ROS: All other review of systems were reviewed and are negative except what is noted above in HPI  Physical Exam: BP (!) 175/75   Pulse 65   Temp 98.6 F (37 C)   Constitutional:  Alert and oriented, No acute distress. HEENT: [place] AT, moist mucus membranes.  Trachea midline, no masses. Cardiovascular: No clubbing, cyanosis, or edema. Respiratory: Normal respiratory effort, no increased work of breathing. GI: Abdomen is soft, nontender,  nondistended, no abdominal masses GU: No CVA tenderness.  Lymph: No cervical or inguinal lymphadenopathy. Skin: No rashes, bruises or suspicious lesions. Neurologic: Grossly intact, no focal deficits, moving all 4 extremities. Psychiatric: Normal mood and affect.  Laboratory Data: Lab Results  Component Value Date   WBC 7.0 02/29/2020   HGB 13.0 02/29/2020   HCT 39.9 02/29/2020   MCV 89.1 02/29/2020   PLT 241 02/29/2020    Lab Results  Component Value Date   CREATININE 0.7 04/13/2021    No results found for: PSA  No results found for: TESTOSTERONE  Lab Results  Component Value Date   HGBA1C 8.7 (H) 03/01/2020    Urinalysis    Component Value Date/Time   COLORURINE STRAW (A) 07/14/2019 0038   APPEARANCEUR Cloudy (A) 10/03/2020 1439   LABSPEC 1.026 07/14/2019 0038   PHURINE 7.0 07/14/2019 0038   GLUCOSEU 3+ (A) 10/03/2020 1439   HGBUR NEGATIVE 07/14/2019 0038   BILIRUBINUR Negative 10/03/2020 1439   KETONESUR 5 (A) 07/14/2019 0038   PROTEINUR 2+ (A) 10/03/2020 1439   PROTEINUR NEGATIVE 07/14/2019 0038   UROBILINOGEN 0.2 09/06/2009 0135   NITRITE Negative 10/03/2020 1439   NITRITE NEGATIVE 07/14/2019 0038   LEUKOCYTESUR 1+ (A) 10/03/2020 1439   LEUKOCYTESUR NEGATIVE 07/14/2019 0038    Lab Results  Component Value Date   LABMICR See below: 10/03/2020   WBCUA 11-30 (A) 10/03/2020   LABEPIT 0-10 10/03/2020   BACTERIA Many (A) 10/03/2020    Pertinent Imaging:  No results found for this or any previous visit.  No results found for this or any previous visit.  No results found for this or any previous visit.  No results found for this or any previous visit.  No results found for this or any previous visit.  No results found for this or any previous visit.  No results found for this or any previous visit.  No results found for this or any previous visit.   Assessment & Plan:    1. Overactive bladder -refill mirabegron 65m daily -RTC 1 year -  Urinalysis, Routine w reflex microscopic - BLADDER SCAN AMB NON-IMAGING   No follow-ups on file.  PNicolette Bang MD  [facility]

## 2021-10-25 NOTE — Progress Notes (Signed)
Urological Symptom Review  Patient is experiencing the following symptoms: Get up at night to urinate   Review of Systems  Gastrointestinal (upper)  : Negative for upper GI symptoms  Gastrointestinal (lower) : Negative for lower GI symptoms  Constitutional : Negative for symptoms  Skin: Negative for skin symptoms  Eyes: Negative for eye symptoms  Ear/Nose/Throat : Negative for Ear/Nose/Throat symptoms  Hematologic/Lymphatic: Negative for Hematologic/Lymphatic symptoms  Cardiovascular : Negative for cardiovascular symptoms  Respiratory : Negative for respiratory symptoms  Endocrine: Negative for endocrine symptoms  Musculoskeletal: Negative for musculoskeletal symptoms  Neurological: Negative for neurological symptoms  Psychologic: Negative for psychiatric symptoms  

## 2021-12-28 ENCOUNTER — Ambulatory Visit: Payer: Medicare HMO | Admitting: Orthopedic Surgery

## 2022-01-01 ENCOUNTER — Ambulatory Visit: Payer: Medicare HMO | Admitting: Orthopedic Surgery

## 2022-01-09 DIAGNOSIS — L821 Other seborrheic keratosis: Secondary | ICD-10-CM | POA: Diagnosis not present

## 2022-01-09 DIAGNOSIS — D692 Other nonthrombocytopenic purpura: Secondary | ICD-10-CM | POA: Diagnosis not present

## 2022-01-09 DIAGNOSIS — L919 Hypertrophic disorder of the skin, unspecified: Secondary | ICD-10-CM | POA: Diagnosis not present

## 2022-01-09 DIAGNOSIS — L820 Inflamed seborrheic keratosis: Secondary | ICD-10-CM | POA: Diagnosis not present

## 2022-01-10 DIAGNOSIS — I639 Cerebral infarction, unspecified: Secondary | ICD-10-CM | POA: Diagnosis not present

## 2022-01-10 DIAGNOSIS — M179 Osteoarthritis of knee, unspecified: Secondary | ICD-10-CM | POA: Diagnosis not present

## 2022-01-10 DIAGNOSIS — R531 Weakness: Secondary | ICD-10-CM | POA: Diagnosis not present

## 2022-01-10 DIAGNOSIS — M5030 Other cervical disc degeneration, unspecified cervical region: Secondary | ICD-10-CM | POA: Diagnosis not present

## 2022-01-10 DIAGNOSIS — R262 Difficulty in walking, not elsewhere classified: Secondary | ICD-10-CM | POA: Diagnosis not present

## 2022-01-18 ENCOUNTER — Encounter: Payer: Self-pay | Admitting: Orthopedic Surgery

## 2022-01-18 ENCOUNTER — Other Ambulatory Visit: Payer: Self-pay

## 2022-01-18 ENCOUNTER — Ambulatory Visit (INDEPENDENT_AMBULATORY_CARE_PROVIDER_SITE_OTHER): Payer: Medicare Other | Admitting: Orthopedic Surgery

## 2022-01-18 DIAGNOSIS — M1711 Unilateral primary osteoarthritis, right knee: Secondary | ICD-10-CM

## 2022-01-18 DIAGNOSIS — M1712 Unilateral primary osteoarthritis, left knee: Secondary | ICD-10-CM

## 2022-01-18 NOTE — Progress Notes (Signed)
Chief Complaint  Patient presents with   Injections    Bilateral knees     Encounter Diagnoses  Name Primary?   Primary osteoarthritis of left knee Yes   Primary osteoarthritis of right knee     Procedure note for bilateral knee injections  Procedure note left knee injection verbal consent was obtained to inject left knee joint  Timeout was completed to confirm the site of injection  The medications used were 40 mg depomedrol and 3 cc of 1% lidocaine  Anesthesia was provided by ethyl chloride and the skin was prepped with alcohol.  After cleaning the skin with alcohol a 20-gauge needle was used to inject the left knee joint. There were no complications. A sterile bandage was applied.   Procedure note right knee injection verbal consent was obtained to inject right knee joint  Timeout was completed to confirm the site of injection  The medications used were 40 mg depomedrol and 3 cc of 1% lidocaine  Anesthesia was provided by ethyl chloride and the skin was prepped with alcohol.  After cleaning the skin with alcohol a 20-gauge needle was used to inject the right knee joint. There were no complications. A sterile bandage was applied.

## 2022-01-22 DIAGNOSIS — R2689 Other abnormalities of gait and mobility: Secondary | ICD-10-CM | POA: Diagnosis not present

## 2022-01-22 DIAGNOSIS — G629 Polyneuropathy, unspecified: Secondary | ICD-10-CM | POA: Diagnosis not present

## 2022-01-22 DIAGNOSIS — I10 Essential (primary) hypertension: Secondary | ICD-10-CM | POA: Diagnosis not present

## 2022-01-22 DIAGNOSIS — E1169 Type 2 diabetes mellitus with other specified complication: Secondary | ICD-10-CM | POA: Diagnosis not present

## 2022-01-22 DIAGNOSIS — I6930 Unspecified sequelae of cerebral infarction: Secondary | ICD-10-CM | POA: Diagnosis not present

## 2022-01-22 DIAGNOSIS — Z794 Long term (current) use of insulin: Secondary | ICD-10-CM | POA: Diagnosis not present

## 2022-01-30 ENCOUNTER — Other Ambulatory Visit: Payer: Self-pay

## 2022-01-30 ENCOUNTER — Other Ambulatory Visit: Payer: Self-pay | Admitting: Urology

## 2022-01-30 DIAGNOSIS — N3281 Overactive bladder: Secondary | ICD-10-CM

## 2022-01-30 MED ORDER — MIRABEGRON ER 50 MG PO TB24: 50.0000 mg | ORAL_TABLET | Freq: Every evening | ORAL | 2 refills | Status: DC

## 2022-02-16 ENCOUNTER — Other Ambulatory Visit: Payer: Self-pay

## 2022-02-16 ENCOUNTER — Ambulatory Visit
Admission: EM | Admit: 2022-02-16 | Discharge: 2022-02-16 | Disposition: A | Discharge status: Home / Self Care | Payer: Medicare Other | Attending: Family Medicine | Admitting: Family Medicine

## 2022-02-16 DIAGNOSIS — M79605 Pain in left leg: Secondary | ICD-10-CM | POA: Diagnosis not present

## 2022-02-16 MED ORDER — IBUPROFEN 800 MG PO TABS: 800.0000 mg | ORAL_TABLET | Freq: Three times a day (TID) | ORAL | 0 refills | Status: DC

## 2022-02-16 MED ORDER — DICLOFENAC SODIUM 1 % EX GEL: 2.0000 g | Freq: Four times a day (QID) | CUTANEOUS | 0 refills | Status: DC

## 2022-02-16 NOTE — ED Provider Notes (Signed)
°[facility] ° ° ° °CSN: [id number] °Arrival date & time: 02/16/22  1401 ° ° °  ° °History   °Chief Complaint °Chief Complaint  °Patient presents with  ° Leg Pain  °  Left leg pain  ° ° °HPI °[name] is a 88 y.o. female.  ° °Presenting today with 2-day history of worsening right anterior upper leg pain.  States the pain is not present at rest but significant when she tries to extend her leg.  She states she is typically in a wheelchair but usually able to move her leg without pain until now.  No known injury prior to onset of symptoms.  No swelling, redness, bruising, numbness, tingling, weakness.  Tried 2 Tylenol this morning with no relief.  States she has ibuprofen at home but has not tried any yet because she thought the Tylenol would help.  No past similar issues. ° ° °Past Medical History:  °Diagnosis Date  ° Diabetes mellitus without complication (HCC)   ° Diabetic neuropathy (HCC)   ° Hypertension   ° Stroke (HCC)   ° Urinary incontinence   ° ° °Patient Active Problem List  ° Diagnosis Date Noted  ° Functional gait abnormality 09/25/2021  ° Late effects of cerebrovascular disease 09/25/2021  ° Long term (current) use of insulin (HCC) 09/25/2021  ° Overactive bladder 10/03/2020  ° Acute arterial ischemic stroke, vertebrobasilar, thalamic, right (HCC)   ° TIA (transient ischemic attack) 02/29/2020  ° DKA, type 2 (HCC) 07/14/2019  ° Uncontrolled type 2 diabetes mellitus with hyperglycemia, with long-term current use of insulin (HCC) 07/14/2019  ° Acute metabolic encephalopathy 07/14/2019  ° Cellulitis, leg 07/14/2019  ° AMS (altered mental status) 07/14/2019  ° Altered mental status 07/13/2019  ° Acute encephalopathy 03/24/2019  ° Hypotension 02/07/2019  ° Slurred speech 11/17/2018  ° Diabetic neuropathy (HCC) 11/17/2018  ° AKI (acute kidney injury) (HCC) 11/17/2018  ° Uncontrolled type 2 diabetes mellitus with hyperglycemia (HCC) 06/11/2018  ° Hyperlipidemia 02/20/2016  ° Essential  hypertension 10/10/2015  ° Vitamin D deficiency 10/10/2015  ° Class 1 obesity due to excess calories without serious comorbidity with body mass index (BMI) of 31.0 to 31.9 in adult 10/10/2015  ° HIP PAIN 01/12/2009  ° SPINAL STENOSIS 01/12/2009  ° LOW BACK PAIN 01/12/2009  ° SPONDYLOLYSIS 01/12/2009  ° SHOULDER PAIN 10/04/2008  ° Uncontrolled type 2 diabetes mellitus with complication, without long-term current use of insulin 10/01/2008  ° ° °Past Surgical History:  °Procedure Laterality Date  ° ABDOMINAL HYSTERECTOMY    ° APPENDECTOMY    ° BREAST SURGERY    ° begnin tumor removed  ° CATARACT EXTRACTION, BILATERAL    ° ° °OB History   °No obstetric history on file. °  ° ° ° °Home Medications   ° °Prior to Admission medications   °Medication Sig Start Date End Date Taking? Authorizing Provider  °diclofenac Sodium (VOLTAREN) 1 % GEL Apply 2 g topically 4 (four) times daily. 02/16/22  Yes ,  Elizabeth, PA-C  °ibuprofen (ADVIL) 800 MG tablet Take 1 tablet (800 mg total) by mouth 3 (three) times daily. 02/16/22  Yes ,  Elizabeth, PA-C  °amLODipine (NORVASC) 10 MG tablet Take 10 mg by mouth daily. 01/02/22   [provider]  °aspirin 325 MG tablet Take 1 tablet (325 mg total) by mouth daily. 03/02/20   Madera, Carlos, MD  °blood glucose meter kit and supplies KIT Dispense based on patient and insurance preference. Use to TEST BLOOD GLUCOSE   GLUCOSE THREE times daily as directed. DX E11.65 02/10/21   Cassandria Anger, MD  Cholecalciferol (VITAMIN D3) 5000 units CAPS Take 1 capsule (5,000 Units total) by mouth daily. Patient taking differently: Take 5,000 Units by mouth every evening. 04/15/17   Nida, Marella Chimes, MD  furosemide (LASIX) 40 MG tablet Take 40 mg by mouth daily as needed. 12/15/19   [provider]  gabapentin (NEURONTIN) 300 MG capsule TAKE ONE CAPSULE (300MG TOTAL) BY MOUTH THREE TIMES DAILY 04/03/21   Cassandria Anger, MD  glipiZIDE (GLUCOTROL XL) 5 MG 24 hr tablet  TAKE 1 TABLET BY MOUTH  TWICE DAILY WITH A MEAL 02/09/21   Nida, Marella Chimes, MD  insulin aspart protamine - aspart (NOVOLOG MIX 70/30 FLEXPEN) (70-30) 100 UNIT/ML FlexPen Inject 0.6 mLs (60 Units total) into the skin 2 (two) times daily before a meal. 02/09/21   Nida, Marella Chimes, MD  Insulin Pen Needle (PEN NEEDLES) 31G X 8 MM MISC Use as directed to inject insulin once daily 08/15/20   Cassandria Anger, MD  Magnesium 200 MG TABS Take 1 tablet (200 mg total) by mouth daily. 11/30/19   Cassandria Anger, MD  metFORMIN (GLUCOPHAGE) 500 MG tablet TAKE ONE TABLET (500MG TOTAL) BY MOUTH TWO TIMES DAILY AFTER A MEAL 02/09/21   Cassandria Anger, MD  metoprolol succinate (TOPROL-XL) 50 MG 24 hr tablet Take 50 mg by mouth at bedtime. Take with or immediately following a meal.    [provider]  MYRBETRIQ 50 MG TB24 tablet TAKE ONE TABLET (50 MG TOTAL) BY MOUTH EVERY EVENING. 01/31/22   McKenzie, Candee Furbish, MD  nystatin (MYCOSTATIN/NYSTOP) powder Apply topically 2 (two) times daily. Apply to affected skin for treatment of fungal infection. Please keep area clean and dry. 11/30/18   Barton Dubois, MD  simvastatin (ZOCOR) 20 MG tablet TAKE 1 TABLET BY MOUTH IN  THE EVENING 01/09/21   Cassandria Anger, MD  zolpidem (AMBIEN) 10 MG tablet 10 mg at bedtime as needed. 05/11/20   [provider]    Family History Family History  Problem Relation Age of Onset   CAD Mother    Hypertension Mother    Stroke Father    Heart attack Brother    Cancer Brother     Social History Social History   Tobacco Use   Smoking status: Never   Smokeless tobacco: Never  Vaping Use   Vaping Use: Never used  Substance Use Topics   Alcohol use: No   Drug use: No     Allergies   Codeine   Review of Systems Review of Systems Per HPI  Physical Exam Triage Vital Signs ED Triage Vitals  Enc Vitals Group     BP 02/16/22 1433 (!) 158/86     Pulse Rate 02/16/22 1433 72     Resp  02/16/22 1433 16     Temp 02/16/22 1433 98 F (36.7 C)     Temp Source 02/16/22 1433 Oral     SpO2 02/16/22 1433 93 %     Weight --      Height --      Head Circumference --      Peak Flow --      Pain Score 02/16/22 1434 10     Pain Loc --      Pain Edu? --      Excl. in [place]? --    No data found.  Updated Vital Signs BP (!) 158/86 (BP Location:  Right Arm)    Pulse 72    Temp 98 F (36.7 C) (Oral)    Resp 16    SpO2 93%   Visual Acuity Right Eye Distance:   Left Eye Distance:   Bilateral Distance:    Right Eye Near:   Left Eye Near:    Bilateral Near:     Physical Exam Vitals and nursing note reviewed.  Constitutional:      Appearance: Normal appearance. She is not ill-appearing.  HENT:     Head: Atraumatic.  Eyes:     Extraocular Movements: Extraocular movements intact.     Conjunctiva/sclera: Conjunctivae normal.  Cardiovascular:     Rate and Rhythm: Normal rate and regular rhythm.     Heart sounds: Normal heart sounds.  Pulmonary:     Effort: Pulmonary effort is normal.     Breath sounds: Normal breath sounds.  Musculoskeletal:     Cervical back: Normal range of motion and neck supple.     Comments: In wheelchair which is patient's baseline reportedly.  Tender to palpation mildly left quadricep muscles, no palpable masses, deformity.  Pain exacerbated with extension of the leg.  No tenderness to palpation at the hip or knee on the left.  Skin:    General: Skin is warm and dry.     Findings: No bruising or erythema.  Neurological:     Mental Status: She is alert. Mental status is at baseline.     Comments: Left lower extremity neurovascularly intact  Psychiatric:        Mood and Affect: Mood normal.     UC Treatments / Results  Labs (all labs ordered are listed, but only abnormal results are displayed) Labs Reviewed - No data to display  EKG   Radiology No results found.  Procedures Procedures (including critical care time)  Medications Ordered  in UC Medications - No data to display  Initial Impression / Assessment and Plan / UC Course  I have reviewed the triage vital signs and the nursing notes.  Pertinent labs & imaging results that were available during my care of the patient were reviewed by me and considered in my medical decision making (see chart for details).     Likely muscular, will treat with Voltaren gel and patient request several tablets of ibuprofen to be sent.  Discussed heat, massage, rest.  PCP follow-up recommended, return for worsening symptoms at any time.  Final Clinical Impressions(s) / UC Diagnoses   Final diagnoses:  Left leg pain   Discharge Instructions   None    ED Prescriptions     Medication Sig Dispense Auth. Provider   diclofenac Sodium (VOLTAREN) 1 % GEL Apply 2 g topically 4 (four) times daily. 150 g [employer]ney [employer], [place]   ibuprofen (ADVIL) 800 MG tablet Take 1 tablet (800 mg total) by mouth 3 (three) times daily. 21 tablet [employer], [place]      PDMP not reviewed this encounter.   Volney [employer], [place] 02/16/22 1642

## 2022-02-16 NOTE — ED Triage Notes (Signed)
Patient states that her left leg started hurting about 2 to 3 days ago. She states it is getting worse by the day  Patient states she took 2 tylenol extra strength this morning but it didn't help  Denies Injury

## 2022-04-19 ENCOUNTER — Ambulatory Visit: Payer: Medicare Other | Admitting: Orthopedic Surgery

## 2022-04-23 ENCOUNTER — Telehealth: Payer: Self-pay

## 2022-04-23 NOTE — Telephone Encounter (Signed)
Please see below. ?Patient states myrbetriq 50 is no longer working and request at alternative. ?

## 2022-04-23 NOTE — Telephone Encounter (Signed)
Patient called advising that she wanted to know if there was an alternative to [facility] '50mg'$ . Patient advised she "thinks it stopped working" patient advised she is out of prescription and  needed a refill on MYRBETRIQ 50 MG TB24 tablet to continue taking if there was no alternative. ? ? ? ?Pharmacy: ?[facility] [place], [place] ?

## 2022-04-25 NOTE — Telephone Encounter (Signed)
Patient notified and samples left at front desk for patient.  ?

## 2022-04-30 ENCOUNTER — Ambulatory Visit: Payer: Medicare Other | Admitting: Orthopedic Surgery

## 2022-05-03 DIAGNOSIS — R262 Difficulty in walking, not elsewhere classified: Secondary | ICD-10-CM | POA: Diagnosis not present

## 2022-05-03 DIAGNOSIS — M179 Osteoarthritis of knee, unspecified: Secondary | ICD-10-CM | POA: Diagnosis not present

## 2022-05-03 DIAGNOSIS — R531 Weakness: Secondary | ICD-10-CM | POA: Diagnosis not present

## 2022-05-03 DIAGNOSIS — M5030 Other cervical disc degeneration, unspecified cervical region: Secondary | ICD-10-CM | POA: Diagnosis not present

## 2022-05-03 DIAGNOSIS — I639 Cerebral infarction, unspecified: Secondary | ICD-10-CM | POA: Diagnosis not present

## 2022-05-03 DIAGNOSIS — E782 Mixed hyperlipidemia: Secondary | ICD-10-CM | POA: Diagnosis not present

## 2022-05-03 DIAGNOSIS — G4700 Insomnia, unspecified: Secondary | ICD-10-CM | POA: Diagnosis not present

## 2022-05-03 DIAGNOSIS — Z23 Encounter for immunization: Secondary | ICD-10-CM | POA: Diagnosis not present

## 2022-05-03 DIAGNOSIS — E1022 Type 1 diabetes mellitus with diabetic chronic kidney disease: Secondary | ICD-10-CM | POA: Diagnosis not present

## 2022-05-03 DIAGNOSIS — I10 Essential (primary) hypertension: Secondary | ICD-10-CM | POA: Diagnosis not present

## 2022-05-07 NOTE — Telephone Encounter (Signed)
Patient called and left message ? ?Tried myrbetriq with no success ?[name] with no success. ? ?Patient asking if there is in alternative- she repots waking up with her bed sheets soaked.  ?

## 2022-05-10 ENCOUNTER — Telehealth: Payer: Self-pay

## 2022-05-10 DIAGNOSIS — R52 Pain, unspecified: Secondary | ICD-10-CM | POA: Diagnosis not present

## 2022-05-10 DIAGNOSIS — R5381 Other malaise: Secondary | ICD-10-CM | POA: Diagnosis not present

## 2022-05-10 DIAGNOSIS — N3281 Overactive bladder: Secondary | ICD-10-CM

## 2022-05-10 MED ORDER — TROSPIUM CHLORIDE ER 60 MG PO CP24: 60.0000 mg | ORAL_CAPSULE | Freq: Every day | ORAL | 5 refills | Status: DC

## 2022-05-10 NOTE — Telephone Encounter (Signed)
Patients Pharmacy called and advised that they were out of stock of medication and it was on backorder. She also advised patients insurance would not cover this medication,   Medication: Trospium Chloride 60 MG CP24

## 2022-05-10 NOTE — Telephone Encounter (Signed)
Needing new medication Dr. Alyson Ingles wants pt to try  to be called into  [place], [place] Phone:  [phone]  Fax:  [phone]     Thanks, Helene Kelp

## 2022-05-10 NOTE — Telephone Encounter (Signed)
Patient Left a voice message 05-09-22:  calling back to check on medication samples to try.  Please call pt back at:  [phone] (M)   Thanks, Helene Kelp

## 2022-05-10 NOTE — Telephone Encounter (Signed)
Patient notified of sanctura '60mg'$  sent to pharmacy.

## 2022-05-10 NOTE — Addendum Note (Signed)
Addended by: Dorisann Frames on: 05/10/2022 04:23 PM   Modules accepted: Orders

## 2022-05-15 ENCOUNTER — Telehealth: Payer: Self-pay

## 2022-05-15 MED ORDER — TROSPIUM CHLORIDE 20 MG PO TABS: 20.0000 mg | ORAL_TABLET | Freq: Two times a day (BID) | ORAL | 11 refills | Status: DC

## 2022-05-15 NOTE — Telephone Encounter (Signed)
Patient called today to see what was going on with her rx, I informed her a new rx had been sent in that her insurance should cover.  She states that she was having chest pain up until lunch time today and she assumed it was from the trospium '60mg'$ . I clarified that she had the right rx because the pharmacy sates that it was on backorder, pt verified and read off rx name and dose.  I urged her to be checked out by her PCP or be evaluated based on her symptoms.  Patient refused.  Patient advised to hold rx until discussion with MD.   Patient is asking if there is any samples in office she can be given?  Please advise.

## 2022-05-15 NOTE — Telephone Encounter (Signed)
Rx sent 

## 2022-05-16 MED ORDER — SOLIFENACIN SUCCINATE 5 MG PO TABS
5.0000 mg | ORAL_TABLET | Freq: Every day | ORAL | 11 refills | Status: DC
Start: 2022-05-16 — End: 2022-06-18

## 2022-05-16 NOTE — Telephone Encounter (Signed)
Called patient to inform her to discontinue the rx if it is causing her chest pain.  She is asking if there is an alternative she can try.  She has a hard time sleeping at night due to her OAB.  Please advise.

## 2022-05-16 NOTE — Telephone Encounter (Signed)
Verbal orders from Dr. Alyson Ingles to send in [place] '5mg'$  to pharmacy.  Informed the patient that per Dr. Alyson Ingles inform her caregiver to watch for signs of confusion in the patient as this can be a side effect of the rx and to discontinue taking if this happens.   Patient states she will notify her husband of side effects.

## 2022-05-16 NOTE — Telephone Encounter (Signed)
Patient left a voice message on-05-15-2022   Patient is calling back to discuss medications.  Please advise.  Thanks, Helene Kelp

## 2022-05-29 ENCOUNTER — Telehealth: Payer: Self-pay | Admitting: Orthopedic Surgery

## 2022-05-29 NOTE — Telephone Encounter (Signed)
Patient called to request order for a wheelchair which she states [facility] has in [place]. States her primary care at [facility] is out this week and was advised by [employer] that if she can get an order sent, they can fill it. Patient phone # [phone] [facility] Phone) [phone] Phone)

## 2022-05-29 NOTE — Telephone Encounter (Signed)
Notified patient; states will wait for her primary care provider to return to take care of it.

## 2022-06-07 DIAGNOSIS — R69 Illness, unspecified: Secondary | ICD-10-CM | POA: Diagnosis not present

## 2022-06-07 DIAGNOSIS — R5381 Other malaise: Secondary | ICD-10-CM | POA: Diagnosis not present

## 2022-06-13 ENCOUNTER — Other Ambulatory Visit: Payer: Self-pay | Admitting: Urology

## 2022-06-21 DIAGNOSIS — R262 Difficulty in walking, not elsewhere classified: Secondary | ICD-10-CM | POA: Diagnosis not present

## 2022-06-21 DIAGNOSIS — M179 Osteoarthritis of knee, unspecified: Secondary | ICD-10-CM | POA: Diagnosis not present

## 2022-06-21 DIAGNOSIS — R531 Weakness: Secondary | ICD-10-CM | POA: Diagnosis not present

## 2022-06-28 ENCOUNTER — Ambulatory Visit: Payer: Medicare Other | Admitting: Orthopedic Surgery

## 2022-07-07 ENCOUNTER — Other Ambulatory Visit: Payer: Self-pay

## 2022-07-07 ENCOUNTER — Emergency Department (HOSPITAL_COMMUNITY): Payer: Medicare Other

## 2022-07-07 ENCOUNTER — Emergency Department (HOSPITAL_COMMUNITY)
Admission: EM | Admit: 2022-07-07 | Discharge: 2022-07-07 | Disposition: A | Discharge status: Home / Self Care | Payer: Medicare Other | Source: Home / Self Care | Attending: Emergency Medicine | Admitting: Emergency Medicine

## 2022-07-07 ENCOUNTER — Encounter (HOSPITAL_COMMUNITY): Payer: Self-pay | Admitting: Emergency Medicine

## 2022-07-07 DIAGNOSIS — Z741 Need for assistance with personal care: Secondary | ICD-10-CM | POA: Diagnosis not present

## 2022-07-07 DIAGNOSIS — W19XXXD Unspecified fall, subsequent encounter: Secondary | ICD-10-CM | POA: Diagnosis not present

## 2022-07-07 DIAGNOSIS — Z743 Need for continuous supervision: Secondary | ICD-10-CM | POA: Diagnosis not present

## 2022-07-07 DIAGNOSIS — Z7984 Long term (current) use of oral hypoglycemic drugs: Secondary | ICD-10-CM | POA: Insufficient documentation

## 2022-07-07 DIAGNOSIS — R079 Chest pain, unspecified: Secondary | ICD-10-CM | POA: Diagnosis not present

## 2022-07-07 DIAGNOSIS — G629 Polyneuropathy, unspecified: Secondary | ICD-10-CM | POA: Diagnosis not present

## 2022-07-07 DIAGNOSIS — Z794 Long term (current) use of insulin: Secondary | ICD-10-CM | POA: Insufficient documentation

## 2022-07-07 DIAGNOSIS — N3000 Acute cystitis without hematuria: Secondary | ICD-10-CM | POA: Diagnosis not present

## 2022-07-07 DIAGNOSIS — Z79899 Other long term (current) drug therapy: Secondary | ICD-10-CM | POA: Insufficient documentation

## 2022-07-07 DIAGNOSIS — R404 Transient alteration of awareness: Secondary | ICD-10-CM | POA: Diagnosis not present

## 2022-07-07 DIAGNOSIS — Z823 Family history of stroke: Secondary | ICD-10-CM | POA: Diagnosis not present

## 2022-07-07 DIAGNOSIS — I10 Essential (primary) hypertension: Secondary | ICD-10-CM | POA: Insufficient documentation

## 2022-07-07 DIAGNOSIS — W1830XA Fall on same level, unspecified, initial encounter: Secondary | ICD-10-CM | POA: Insufficient documentation

## 2022-07-07 DIAGNOSIS — W19XXXA Unspecified fall, initial encounter: Secondary | ICD-10-CM

## 2022-07-07 DIAGNOSIS — R220 Localized swelling, mass and lump, head: Secondary | ICD-10-CM | POA: Diagnosis not present

## 2022-07-07 DIAGNOSIS — Z981 Arthrodesis status: Secondary | ICD-10-CM | POA: Diagnosis not present

## 2022-07-07 DIAGNOSIS — M25552 Pain in left hip: Secondary | ICD-10-CM | POA: Diagnosis not present

## 2022-07-07 DIAGNOSIS — J9600 Acute respiratory failure, unspecified whether with hypoxia or hypercapnia: Secondary | ICD-10-CM | POA: Diagnosis not present

## 2022-07-07 DIAGNOSIS — I447 Left bundle-branch block, unspecified: Secondary | ICD-10-CM | POA: Diagnosis not present

## 2022-07-07 DIAGNOSIS — S7012XA Contusion of left thigh, initial encounter: Secondary | ICD-10-CM | POA: Insufficient documentation

## 2022-07-07 DIAGNOSIS — I499 Cardiac arrhythmia, unspecified: Secondary | ICD-10-CM | POA: Diagnosis not present

## 2022-07-07 DIAGNOSIS — N3281 Overactive bladder: Secondary | ICD-10-CM | POA: Diagnosis not present

## 2022-07-07 DIAGNOSIS — E860 Dehydration: Secondary | ICD-10-CM | POA: Diagnosis not present

## 2022-07-07 DIAGNOSIS — S0012XA Contusion of left eyelid and periocular area, initial encounter: Secondary | ICD-10-CM | POA: Diagnosis not present

## 2022-07-07 DIAGNOSIS — Z7401 Bed confinement status: Secondary | ICD-10-CM | POA: Diagnosis not present

## 2022-07-07 DIAGNOSIS — R531 Weakness: Secondary | ICD-10-CM | POA: Diagnosis not present

## 2022-07-07 DIAGNOSIS — E559 Vitamin D deficiency, unspecified: Secondary | ICD-10-CM | POA: Diagnosis not present

## 2022-07-07 DIAGNOSIS — E1143 Type 2 diabetes mellitus with diabetic autonomic (poly)neuropathy: Secondary | ICD-10-CM | POA: Diagnosis not present

## 2022-07-07 DIAGNOSIS — N3001 Acute cystitis with hematuria: Secondary | ICD-10-CM | POA: Diagnosis not present

## 2022-07-07 DIAGNOSIS — E1159 Type 2 diabetes mellitus with other circulatory complications: Secondary | ICD-10-CM | POA: Diagnosis not present

## 2022-07-07 DIAGNOSIS — I152 Hypertension secondary to endocrine disorders: Secondary | ICD-10-CM | POA: Diagnosis not present

## 2022-07-07 DIAGNOSIS — Z885 Allergy status to narcotic agent status: Secondary | ICD-10-CM | POA: Diagnosis not present

## 2022-07-07 DIAGNOSIS — W06XXXA Fall from bed, initial encounter: Secondary | ICD-10-CM | POA: Diagnosis present

## 2022-07-07 DIAGNOSIS — Z9181 History of falling: Secondary | ICD-10-CM | POA: Diagnosis not present

## 2022-07-07 DIAGNOSIS — R6889 Other general symptoms and signs: Secondary | ICD-10-CM | POA: Diagnosis not present

## 2022-07-07 DIAGNOSIS — J9620 Acute and chronic respiratory failure, unspecified whether with hypoxia or hypercapnia: Secondary | ICD-10-CM | POA: Diagnosis not present

## 2022-07-07 DIAGNOSIS — E119 Type 2 diabetes mellitus without complications: Secondary | ICD-10-CM | POA: Insufficient documentation

## 2022-07-07 DIAGNOSIS — I639 Cerebral infarction, unspecified: Secondary | ICD-10-CM | POA: Diagnosis not present

## 2022-07-07 DIAGNOSIS — E1140 Type 2 diabetes mellitus with diabetic neuropathy, unspecified: Secondary | ICD-10-CM | POA: Diagnosis not present

## 2022-07-07 DIAGNOSIS — Z7985 Long-term (current) use of injectable non-insulin antidiabetic drugs: Secondary | ICD-10-CM | POA: Diagnosis not present

## 2022-07-07 DIAGNOSIS — Z8673 Personal history of transient ischemic attack (TIA), and cerebral infarction without residual deficits: Secondary | ICD-10-CM | POA: Diagnosis not present

## 2022-07-07 DIAGNOSIS — R278 Other lack of coordination: Secondary | ICD-10-CM | POA: Diagnosis not present

## 2022-07-07 DIAGNOSIS — E11649 Type 2 diabetes mellitus with hypoglycemia without coma: Secondary | ICD-10-CM | POA: Diagnosis not present

## 2022-07-07 DIAGNOSIS — Z7982 Long term (current) use of aspirin: Secondary | ICD-10-CM | POA: Diagnosis not present

## 2022-07-07 DIAGNOSIS — S0990XA Unspecified injury of head, initial encounter: Secondary | ICD-10-CM | POA: Diagnosis not present

## 2022-07-07 DIAGNOSIS — N390 Urinary tract infection, site not specified: Secondary | ICD-10-CM | POA: Diagnosis not present

## 2022-07-07 DIAGNOSIS — Y92003 Bedroom of unspecified non-institutional (private) residence as the place of occurrence of the external cause: Secondary | ICD-10-CM | POA: Diagnosis not present

## 2022-07-07 DIAGNOSIS — E785 Hyperlipidemia, unspecified: Secondary | ICD-10-CM | POA: Diagnosis not present

## 2022-07-07 DIAGNOSIS — M6281 Muscle weakness (generalized): Secondary | ICD-10-CM | POA: Diagnosis not present

## 2022-07-07 DIAGNOSIS — B961 Klebsiella pneumoniae [K. pneumoniae] as the cause of diseases classified elsewhere: Secondary | ICD-10-CM | POA: Diagnosis present

## 2022-07-07 DIAGNOSIS — Z043 Encounter for examination and observation following other accident: Secondary | ICD-10-CM | POA: Diagnosis not present

## 2022-07-07 DIAGNOSIS — Z8249 Family history of ischemic heart disease and other diseases of the circulatory system: Secondary | ICD-10-CM | POA: Diagnosis not present

## 2022-07-07 NOTE — ED Triage Notes (Signed)
PT to ER via EMS from home with c/o fall last night.  Pt missed bed and landed in foor.  Pt denies pain, but was unable to get herself up last night.  Pt also has black eye from probable fall several days ago.  Pt states she does not remember either fall.

## 2022-07-07 NOTE — ED Notes (Signed)
Pt's daughter called regarding pt's status and asked if EMS was going to bring pt home or if daughter needed to come get her. Daughter was told that it was in the best interest of the pt if daughter came to pick up pt as it is unknown when EMS would be able to pick up pt

## 2022-07-07 NOTE — Discharge Instructions (Signed)
Follow-up with your family doctor if any problems

## 2022-07-07 NOTE — ED Provider Notes (Signed)
[facility] Provider Note   CSN: [id number] Arrival date & time: 07/07/22  1037     History {Add pertinent medical, surgical, social history, OB history to HPI:1} Chief Complaint  Patient presents with   [name] is a 88 y.o. female.  Patient with a history of hypertension and diabetes.  She missed the bed when she was trying to sit down and fell onto the floor.  She was unable to get up.  Patient has no complaints.  Patient has a history of a previous fall that has caused a black eye to the left eye   Fall       Home Medications Prior to Admission medications   Medication Sig Start Date End Date Taking? Authorizing Provider  amLODipine (NORVASC) 10 MG tablet Take 10 mg by mouth daily. 01/02/22   [provider]  aspirin 325 MG tablet Take 1 tablet (325 mg total) by mouth daily. 03/02/20   Barton Dubois, MD  blood glucose meter kit and supplies KIT Dispense based on patient and insurance preference. Use to TEST BLOOD GLUCOSE THREE times daily as directed. DX E11.65 02/10/21   Cassandria Anger, MD  Cholecalciferol (VITAMIN D3) 5000 units CAPS Take 1 capsule (5,000 Units total) by mouth daily. Patient taking differently: Take 5,000 Units by mouth every evening. 04/15/17   Nida, Marella Chimes, MD  diclofenac Sodium (VOLTAREN) 1 % GEL Apply 2 g topically 4 (four) times daily. 02/16/22   Volney American, PA-C  furosemide (LASIX) 40 MG tablet Take 40 mg by mouth daily as needed. 12/15/19   [provider]  gabapentin (NEURONTIN) 300 MG capsule TAKE ONE CAPSULE (300MG TOTAL) BY MOUTH THREE TIMES DAILY 04/03/21   Cassandria Anger, MD  glipiZIDE (GLUCOTROL XL) 5 MG 24 hr tablet TAKE 1 TABLET BY MOUTH  TWICE DAILY WITH A MEAL 02/09/21   Cassandria Anger, MD  ibuprofen (ADVIL) 800 MG tablet Take 1 tablet (800 mg total) by mouth 3 (three) times daily. 02/16/22   Volney American, PA-C  insulin aspart protamine  - aspart (NOVOLOG MIX 70/30 FLEXPEN) (70-30) 100 UNIT/ML FlexPen Inject 0.6 mLs (60 Units total) into the skin 2 (two) times daily before a meal. 02/09/21   Nida, Marella Chimes, MD  Insulin Pen Needle (PEN NEEDLES) 31G X 8 MM MISC Use as directed to inject insulin once daily 08/15/20   Cassandria Anger, MD  Magnesium 200 MG TABS Take 1 tablet (200 mg total) by mouth daily. 11/30/19   Cassandria Anger, MD  metFORMIN (GLUCOPHAGE) 500 MG tablet TAKE ONE TABLET (500MG TOTAL) BY MOUTH TWO TIMES DAILY AFTER A MEAL 02/09/21   Cassandria Anger, MD  metoprolol succinate (TOPROL-XL) 50 MG 24 hr tablet Take 50 mg by mouth at bedtime. Take with or immediately following a meal.    [provider]  nystatin (MYCOSTATIN/NYSTOP) powder Apply topically 2 (two) times daily. Apply to affected skin for treatment of fungal infection. Please keep area clean and dry. 11/30/18   Barton Dubois, MD  simvastatin (ZOCOR) 20 MG tablet TAKE 1 TABLET BY MOUTH IN  THE EVENING 01/09/21   Cassandria Anger, MD  solifenacin (VESICARE) 5 MG tablet TAKE ONE TABLT (5 MG TOTAL) BY MOUTH DAILY. 06/18/22   McKenzie, Candee Furbish, MD  zolpidem (AMBIEN) 10 MG tablet 10 mg at bedtime as needed. 05/11/20   [provider]      Allergies    Codeine  Review of Systems   Review of Systems  Physical Exam Updated Vital Signs BP (!) 166/148   Pulse 75   Temp 98 F (36.7 C) (Oral)   Resp 16   Ht 5' 9" (1.753 m)   Wt 90.3 kg   SpO2 95%   BMI 29.39 kg/m  Physical Exam  ED Results / Procedures / Treatments   Labs (all labs ordered are listed, but only abnormal results are displayed) Labs Reviewed - No data to display  EKG None  Radiology CT Maxillofacial Wo Contrast  Result Date: 07/07/2022 CLINICAL DATA:  Head trauma. Fall getting into bed last night. Was unable to get herself up. Additional fall several days ago with black eye. EXAM: CT MAXILLOFACIAL WITHOUT CONTRAST TECHNIQUE: Multidetector CT  imaging of the maxillofacial structures was performed. Multiplanar CT image reconstructions were also generated. RADIATION DOSE REDUCTION: This exam was performed according to the departmental dose-optimization program which includes automated exposure control, adjustment of the mA and/or kV according to patient size and/or use of iterative reconstruction technique. COMPARISON:  CT head 02/29/2020 FINDINGS: Osseous: No fracture or mandibular dislocation. No destructive process. Advanced degenerative changes are present at the TMJ bilaterally. No residual maxillary teeth are present. Orbits: Bilateral lens replacements are noted. Globes and orbits are otherwise unremarkable. Sinuses: The paranasal sinuses and mastoid air cells are clear. Soft tissues: Soft tissues the face are unremarkable. No acute soft tissue injury. IMPRESSION: 1. No acute trauma to the face. 2. Advanced degenerative changes at the TMJ bilaterally. Electronically Signed   By: San Morelle M.D.   On: 07/07/2022 13:26   CT Cervical Spine Wo Contrast  Result Date: 07/07/2022 CLINICAL DATA:  Fall getting into bed last night. Patient was unable to get up after the fall. EXAM: CT CERVICAL SPINE WITHOUT CONTRAST TECHNIQUE: Multidetector CT imaging of the cervical spine was performed without intravenous contrast. Multiplanar CT image reconstructions were also generated. RADIATION DOSE REDUCTION: This exam was performed according to the departmental dose-optimization program which includes automated exposure control, adjustment of the mA and/or kV according to patient size and/or use of iterative reconstruction technique. COMPARISON:  MRI of the cervical spine 03/18/2015 FINDINGS: Alignment: No significant listhesis present. Straightening of the normal cervical lordosis is similar the prior exam. Skull base and vertebrae: Craniocervical junction is within normal limits. Prominent pannus again seen. No acute fractures are present. Soft tissues  and spinal canal: No prevertebral fluid or swelling. No visible canal hematoma. Disc levels: Multilevel degenerative changes are present throughout the cervical spine. Foraminal narrowing is greatest on the left at C4-5 and on the right at C6-7 due to uncovertebral spurring. Upper chest: Lung apices are clear. Thoracic inlet is within normal limits. Advanced degenerative changes are present at the right sternoclavicular joint. IMPRESSION: 1. No acute fracture or traumatic subluxation. 2. Multilevel degenerative changes as described. Electronically Signed   By: San Morelle M.D.   On: 07/07/2022 13:24   CT Head Wo Contrast  Result Date: 07/07/2022 CLINICAL DATA:  Head trauma. Fall getting into bed last night. Was unable to get herself up. Additional fall several days ago with black eye. EXAM: CT HEAD WITHOUT CONTRAST TECHNIQUE: Contiguous axial images were obtained from the base of the skull through the vertex without intravenous contrast. RADIATION DOSE REDUCTION: This exam was performed according to the departmental dose-optimization program which includes automated exposure control, adjustment of the mA and/or kV according to patient size and/or use of iterative reconstruction technique. COMPARISON:  MR head without  contrast 03/01/2020. CT head without contrast 02/29/2020. FINDINGS: Brain: Moderate atrophy and white matter disease is stable. No acute infarct, hemorrhage, or mass lesion is present. The ventricles are proportionate to the degree of atrophy. No significant extraaxial fluid collection is present. The brainstem and cerebellum are within normal limits. Vascular: Atherosclerotic calcifications are present within the cavernous internal carotid arteries. No hyperdense vessel is present. Skull: Scalp soft tissue swelling is present near the vertex, asymmetric to the left. No underlying fracture or foreign body is present. Sinuses/Orbits: The paranasal sinuses and mastoid air cells are clear. The  globes and orbits are within normal limits. IMPRESSION: 1. Scalp soft tissue swelling near the vertex, asymmetric to the left. No underlying fracture or foreign body. 2. Stable atrophy and white matter disease. This likely reflects the sequela of chronic microvascular ischemia. 3. Atherosclerosis. Electronically Signed   By: San Morelle M.D.   On: 07/07/2022 13:21   DG Pelvis 1-2 Views  Result Date: 07/07/2022 CLINICAL DATA:  Fall last night EXAM: PELVIS - 1-2 VIEW COMPARISON:  None Available. FINDINGS: No pelvic fracture or diastasis. No hip dislocation on this single frontal view. Right hip is positioned in internal rotation. Right L4-5 spinal fusion hardware with no evidence of hardware fracture or loosening. Degenerative changes in the visualized lower lumbar spine. Mild bilateral hip osteoarthritis. No suspicious focal osseous lesions. Surgical clips in the right deep pelvis. IMPRESSION: No pelvic fracture. No hip dislocation on this single frontal view. Mild bilateral hip osteoarthritis. Electronically Signed   By: Ilona Sorrel M.D.   On: 07/07/2022 12:57   DG Lumbar Spine Complete  Result Date: 07/07/2022 CLINICAL DATA:  Fall last night EXAM: LUMBAR SPINE - COMPLETE 4+ VIEW COMPARISON:  06/13/2011 lumbar spine radiographs FINDINGS: This report assumes 5 non rib-bearing lumbar vertebrae. Status post right lumbar fusion at L4-5 with no evidence of hardware fracture or loosening. Bone cage in place in the L4-5 disc space. Lumbar vertebral body heights are preserved, with no fracture. Marked multilevel lumbar degenerative disc disease, most prominent at L2-3. Minimal 2 mm retrolisthesis at L1-2, L2-3 and L3-4. Mild bilateral lower lumbar facet arthropathy. No aggressive appearing focal osseous lesions. Abdominal aortic atherosclerosis. Surgical clips in the right deep pelvis. IMPRESSION: 1. No lumbar spine fracture or acute malalignment. 2. Status post right lumbar fusion at L4-5 without evidence  of hardware complication. 3. Marked multilevel lumbar degenerative disc disease with minimal multilevel lumbar retrolisthesis as detailed. Electronically Signed   By: Ilona Sorrel M.D.   On: 07/07/2022 12:55    Procedures Procedures  {Document cardiac monitor, telemetry assessment procedure when appropriate:1}  Medications Ordered in ED Medications - No data to display  ED Course/ Medical Decision Making/ A&P                           Medical Decision Making Amount and/or Complexity of Data Reviewed Radiology: ordered.   Patient with a fall and no significant injuries except for contusion to face.  She will follow-up with her PCP as needed  {Document critical care time when appropriate:1} {Document review of labs and clinical decision tools ie heart score, Chads2Vasc2 etc:1}  {Document your independent review of radiology images, and any outside records:1} {Document your discussion with family members, caretakers, and with consultants:1} {Document social determinants of health affecting pt's care:1} {Document your decision making why or why not admission, treatments were needed:1} Final Clinical Impression(s) / ED Diagnoses Final diagnoses:  Fall, initial  encounter    Rx / DC Orders ED Discharge Orders     None

## 2022-07-07 NOTE — ED Notes (Signed)
Pt states needs PTAR to return her home.

## 2022-07-09 ENCOUNTER — Other Ambulatory Visit: Payer: Self-pay

## 2022-07-09 ENCOUNTER — Encounter (HOSPITAL_COMMUNITY): Payer: Self-pay

## 2022-07-09 ENCOUNTER — Inpatient Hospital Stay (HOSPITAL_COMMUNITY)
Admission: EM | Admit: 2022-07-09 | Discharge: 2022-07-11 | DRG: 690 | Disposition: A | Discharge status: Skilled Nursing Facility | Payer: Medicare Other | Attending: Family Medicine | Admitting: Family Medicine

## 2022-07-09 ENCOUNTER — Emergency Department (HOSPITAL_COMMUNITY): Payer: Medicare Other

## 2022-07-09 DIAGNOSIS — W06XXXA Fall from bed, initial encounter: Secondary | ICD-10-CM | POA: Diagnosis present

## 2022-07-09 DIAGNOSIS — Z7982 Long term (current) use of aspirin: Secondary | ICD-10-CM

## 2022-07-09 DIAGNOSIS — E1165 Type 2 diabetes mellitus with hyperglycemia: Secondary | ICD-10-CM

## 2022-07-09 DIAGNOSIS — S0012XA Contusion of left eyelid and periocular area, initial encounter: Secondary | ICD-10-CM | POA: Diagnosis present

## 2022-07-09 DIAGNOSIS — E1140 Type 2 diabetes mellitus with diabetic neuropathy, unspecified: Secondary | ICD-10-CM | POA: Diagnosis present

## 2022-07-09 DIAGNOSIS — Z8673 Personal history of transient ischemic attack (TIA), and cerebral infarction without residual deficits: Secondary | ICD-10-CM | POA: Diagnosis not present

## 2022-07-09 DIAGNOSIS — Z823 Family history of stroke: Secondary | ICD-10-CM | POA: Diagnosis not present

## 2022-07-09 DIAGNOSIS — Z79899 Other long term (current) drug therapy: Secondary | ICD-10-CM | POA: Diagnosis not present

## 2022-07-09 DIAGNOSIS — Z7401 Bed confinement status: Secondary | ICD-10-CM | POA: Diagnosis not present

## 2022-07-09 DIAGNOSIS — E860 Dehydration: Secondary | ICD-10-CM | POA: Diagnosis present

## 2022-07-09 DIAGNOSIS — N390 Urinary tract infection, site not specified: Secondary | ICD-10-CM | POA: Diagnosis present

## 2022-07-09 DIAGNOSIS — E119 Type 2 diabetes mellitus without complications: Secondary | ICD-10-CM

## 2022-07-09 DIAGNOSIS — W19XXXD Unspecified fall, subsequent encounter: Secondary | ICD-10-CM | POA: Diagnosis not present

## 2022-07-09 DIAGNOSIS — Z7985 Long-term (current) use of injectable non-insulin antidiabetic drugs: Secondary | ICD-10-CM | POA: Diagnosis not present

## 2022-07-09 DIAGNOSIS — Z885 Allergy status to narcotic agent status: Secondary | ICD-10-CM

## 2022-07-09 DIAGNOSIS — E11649 Type 2 diabetes mellitus with hypoglycemia without coma: Secondary | ICD-10-CM | POA: Diagnosis present

## 2022-07-09 DIAGNOSIS — Y92003 Bedroom of unspecified non-institutional (private) residence as the place of occurrence of the external cause: Secondary | ICD-10-CM

## 2022-07-09 DIAGNOSIS — B961 Klebsiella pneumoniae [K. pneumoniae] as the cause of diseases classified elsewhere: Secondary | ICD-10-CM | POA: Diagnosis present

## 2022-07-09 DIAGNOSIS — Z794 Long term (current) use of insulin: Secondary | ICD-10-CM

## 2022-07-09 DIAGNOSIS — Z7984 Long term (current) use of oral hypoglycemic drugs: Secondary | ICD-10-CM | POA: Diagnosis not present

## 2022-07-09 DIAGNOSIS — I10 Essential (primary) hypertension: Secondary | ICD-10-CM | POA: Diagnosis present

## 2022-07-09 DIAGNOSIS — N3000 Acute cystitis without hematuria: Secondary | ICD-10-CM | POA: Diagnosis not present

## 2022-07-09 DIAGNOSIS — J9600 Acute respiratory failure, unspecified whether with hypoxia or hypercapnia: Secondary | ICD-10-CM | POA: Diagnosis present

## 2022-07-09 DIAGNOSIS — Z8249 Family history of ischemic heart disease and other diseases of the circulatory system: Secondary | ICD-10-CM

## 2022-07-09 DIAGNOSIS — N3001 Acute cystitis with hematuria: Secondary | ICD-10-CM | POA: Diagnosis present

## 2022-07-09 DIAGNOSIS — G629 Polyneuropathy, unspecified: Secondary | ICD-10-CM | POA: Diagnosis not present

## 2022-07-09 LAB — CBC
HCT: 42.8 % (ref 36.0–46.0)
Hemoglobin: 13.8 g/dL (ref 12.0–15.0)
MCH: 30.1 pg (ref 26.0–34.0)
MCHC: 32.2 g/dL (ref 30.0–36.0)
MCV: 93.2 fL (ref 80.0–100.0)
Platelets: 298 10*3/uL (ref 150–400)
RBC: 4.59 MIL/uL (ref 3.87–5.11)
RDW: 12.7 % (ref 11.5–15.5)
WBC: 15.4 10*3/uL — ABNORMAL HIGH (ref 4.0–10.5)
nRBC: 0 % (ref 0.0–0.2)

## 2022-07-09 LAB — COMPREHENSIVE METABOLIC PANEL
ALT: 19 U/L (ref 0–44)
AST: 25 U/L (ref 15–41)
Albumin: 3.3 g/dL — ABNORMAL LOW (ref 3.5–5.0)
Alkaline Phosphatase: 100 U/L (ref 38–126)
Anion gap: 12 (ref 5–15)
BUN: 22 mg/dL (ref 8–23)
CO2: 23 mmol/L (ref 22–32)
Calcium: 9.7 mg/dL (ref 8.9–10.3)
Chloride: 104 mmol/L (ref 98–111)
Creatinine, Ser: 0.86 mg/dL (ref 0.44–1.00)
GFR, Estimated: >60 mL/min (ref >60)
Glucose, Bld: 53 mg/dL — ABNORMAL LOW (ref 70–99)
Potassium: 4 mmol/L (ref 3.5–5.1)
Sodium: 139 mmol/L (ref 135–145)
Total Bilirubin: 1.6 mg/dL — ABNORMAL HIGH (ref 0.3–1.2)
Total Protein: 7.6 g/dL (ref 6.5–8.1)

## 2022-07-09 LAB — URINALYSIS, ROUTINE W REFLEX MICROSCOPIC
Bilirubin Urine: NEGATIVE
Glucose, UA: NEGATIVE mg/dL
Ketones, ur: 20 mg/dL — AB
Nitrite: POSITIVE — AB
Protein, ur: 100 mg/dL — AB
Specific Gravity, Urine: 1.023 (ref 1.005–1.030)
WBC, UA: >50 WBC/hpf — ABNORMAL HIGH (ref 0–5)
pH: 5 (ref 5.0–8.0)

## 2022-07-09 LAB — CBC WITH DIFFERENTIAL/PLATELET
Abs Immature Granulocytes: 0.1 10*3/uL — ABNORMAL HIGH (ref 0.00–0.07)
Basophils Absolute: 0.1 10*3/uL (ref 0.0–0.1)
Basophils Relative: 1 %
Eosinophils Absolute: 0 10*3/uL (ref 0.0–0.5)
Eosinophils Relative: 0 %
HCT: 47.7 % — ABNORMAL HIGH (ref 36.0–46.0)
Hemoglobin: 15.6 g/dL — ABNORMAL HIGH (ref 12.0–15.0)
Immature Granulocytes: 1 %
Lymphocytes Relative: 7 %
Lymphs Abs: 1.3 10*3/uL (ref 0.7–4.0)
MCH: 29.9 pg (ref 26.0–34.0)
MCHC: 32.7 g/dL (ref 30.0–36.0)
MCV: 91.4 fL (ref 80.0–100.0)
Monocytes Absolute: 2 10*3/uL — ABNORMAL HIGH (ref 0.1–1.0)
Monocytes Relative: 11 %
Neutro Abs: 14.6 10*3/uL — ABNORMAL HIGH (ref 1.7–7.7)
Neutrophils Relative %: 80 %
Platelets: 303 10*3/uL (ref 150–400)
RBC: 5.22 MIL/uL — ABNORMAL HIGH (ref 3.87–5.11)
RDW: 12.7 % (ref 11.5–15.5)
WBC: 18.1 10*3/uL — ABNORMAL HIGH (ref 4.0–10.5)
nRBC: 0 % (ref 0.0–0.2)

## 2022-07-09 LAB — CREATININE, SERUM
Creatinine, Ser: 0.74 mg/dL (ref 0.44–1.00)
GFR, Estimated: >60 mL/min (ref >60)

## 2022-07-09 LAB — CBG MONITORING, ED: Glucose-Capillary: 73 mg/dL (ref 70–99)

## 2022-07-09 LAB — CK: Total CK: 44 U/L (ref 38–234)

## 2022-07-09 MED ORDER — SIMVASTATIN 20 MG PO TABS
20.0000 mg | ORAL_TABLET | Freq: Every evening | ORAL | Status: DC
  Administered 2022-07-09 – 2022-07-10 (×2): 20 mg via ORAL
  Filled 2022-07-09 (×2): qty 1

## 2022-07-09 MED ORDER — ZOLPIDEM TARTRATE 5 MG PO TABS: 5.0000 mg | ORAL_TABLET | Freq: Every evening | ORAL | Status: DC | PRN

## 2022-07-09 MED ORDER — HEPARIN SODIUM (PORCINE) 5000 UNIT/ML IJ SOLN
5000.0000 [IU] | Freq: Three times a day (TID) | INTRAMUSCULAR | Status: DC
  Administered 2022-07-09 – 2022-07-11 (×5): 5000 [IU] via SUBCUTANEOUS
  Filled 2022-07-09 (×6): qty 1

## 2022-07-09 MED ORDER — ACETAMINOPHEN 650 MG RE SUPP: 650.0000 mg | Freq: Four times a day (QID) | RECTAL | Status: DC | PRN

## 2022-07-09 MED ORDER — FUROSEMIDE 40 MG PO TABS: 40.0000 mg | ORAL_TABLET | Freq: Every day | ORAL | Status: DC | PRN

## 2022-07-09 MED ORDER — METOPROLOL SUCCINATE ER 50 MG PO TB24
50.0000 mg | ORAL_TABLET | Freq: Every day | ORAL | Status: DC
Start: 2022-07-09 — End: 2022-07-11
  Administered 2022-07-09 – 2022-07-10 (×2): 50 mg via ORAL
  Filled 2022-07-09 (×2): qty 1

## 2022-07-09 MED ORDER — SODIUM CHLORIDE 0.9 % IV SOLN: INTRAVENOUS | Status: DC

## 2022-07-09 MED ORDER — GABAPENTIN 300 MG PO CAPS
300.0000 mg | ORAL_CAPSULE | Freq: Three times a day (TID) | ORAL | Status: DC
  Administered 2022-07-09 – 2022-07-11 (×5): 300 mg via ORAL
  Filled 2022-07-09 (×5): qty 1

## 2022-07-09 MED ORDER — SODIUM CHLORIDE 0.9 % IV SOLN
2.0000 g | Freq: Once | INTRAVENOUS | Status: AC
  Administered 2022-07-09: 2 g via INTRAVENOUS
  Filled 2022-07-09: qty 20

## 2022-07-09 MED ORDER — KETOROLAC TROMETHAMINE 15 MG/ML IJ SOLN
15.0000 mg | Freq: Four times a day (QID) | INTRAMUSCULAR | Status: DC | PRN
  Administered 2022-07-10: 15 mg via INTRAVENOUS
  Filled 2022-07-09: qty 1

## 2022-07-09 MED ORDER — GLUCOSE 40 % PO GEL
1.0000 | Freq: Once | ORAL | Status: AC
  Administered 2022-07-09: 31 g via ORAL
  Filled 2022-07-09: qty 1.21

## 2022-07-09 MED ORDER — DARIFENACIN HYDROBROMIDE ER 7.5 MG PO TB24
7.5000 mg | ORAL_TABLET | Freq: Every day | ORAL | Status: DC
  Administered 2022-07-09 – 2022-07-11 (×3): 7.5 mg via ORAL
  Filled 2022-07-09 (×3): qty 1

## 2022-07-09 MED ORDER — INSULIN ASPART 100 UNIT/ML IJ SOLN
0.0000 [IU] | Freq: Three times a day (TID) | INTRAMUSCULAR | Status: DC
  Administered 2022-07-10: 8 [IU] via SUBCUTANEOUS
  Administered 2022-07-10: 3 [IU] via SUBCUTANEOUS
  Administered 2022-07-10 – 2022-07-11 (×2): 2 [IU] via SUBCUTANEOUS
  Administered 2022-07-11: 5 [IU] via SUBCUTANEOUS

## 2022-07-09 MED ORDER — ASPIRIN 325 MG PO TABS
325.0000 mg | ORAL_TABLET | Freq: Every day | ORAL | Status: DC
  Administered 2022-07-09 – 2022-07-11 (×3): 325 mg via ORAL
  Filled 2022-07-09 (×3): qty 1

## 2022-07-09 MED ORDER — SODIUM CHLORIDE 0.9 % IV BOLUS
1000.0000 mL | Freq: Once | INTRAVENOUS | Status: AC
  Administered 2022-07-09: 1000 mL via INTRAVENOUS

## 2022-07-09 MED ORDER — METHOCARBAMOL 1000 MG/10ML IJ SOLN: 500.0000 mg | Freq: Four times a day (QID) | INTRAVENOUS | Status: DC | PRN

## 2022-07-09 MED ORDER — AMLODIPINE BESYLATE 5 MG PO TABS
5.0000 mg | ORAL_TABLET | Freq: Every day | ORAL | Status: DC
Start: 2022-07-09 — End: 2022-07-11
  Administered 2022-07-09 – 2022-07-11 (×3): 5 mg via ORAL
  Filled 2022-07-09 (×3): qty 1

## 2022-07-09 MED ORDER — HYDRALAZINE HCL 20 MG/ML IJ SOLN: 5.0000 mg | INTRAMUSCULAR | Status: DC | PRN

## 2022-07-09 MED ORDER — SODIUM CHLORIDE 0.9 % IV SOLN
1.0000 g | INTRAVENOUS | Status: DC
  Administered 2022-07-10 – 2022-07-11 (×2): 1 g via INTRAVENOUS
  Filled 2022-07-09 (×2): qty 10

## 2022-07-09 MED ORDER — ACETAMINOPHEN 325 MG PO TABS: 650.0000 mg | ORAL_TABLET | Freq: Four times a day (QID) | ORAL | Status: DC | PRN

## 2022-07-09 MED ORDER — ONDANSETRON HCL 4 MG PO TABS: 4.0000 mg | ORAL_TABLET | Freq: Four times a day (QID) | ORAL | Status: DC | PRN

## 2022-07-09 MED ORDER — ONDANSETRON HCL 4 MG/2ML IJ SOLN: 4.0000 mg | Freq: Four times a day (QID) | INTRAMUSCULAR | Status: DC | PRN

## 2022-07-09 MED ORDER — ZOLPIDEM TARTRATE 5 MG PO TABS: 10.0000 mg | ORAL_TABLET | Freq: Every evening | ORAL | Status: DC | PRN

## 2022-07-09 NOTE — H&P (Signed)
History and Physical    Patient: [name] PPI:[id number] DOB: 1929/10/07 DOA: 07/09/2022 DOS: the patient was seen and examined on 07/09/2022 PCP: Sharilyn Sites, MD  Patient coming from: Home  Chief Complaint:  Chief Complaint  Patient presents with   Weakness   HPI: [name] is a 88 y.o. female with medical history significant of DM, HTN, Neuropathy, CVA, Urinary incontinence. Pt presenting w/ several day h/o progressive weakenss and fall. Fall occurred w/o syncope. She did strike her head/eye. Fall occurred when she missed sitting on her bed. Pt seen in the ED on day of fall (7/15) and sent home. Pt has been unable to get out of bed since the fall and developed further weakness since that time. No CP, SOB, LE swelling above baseline, HA, focal neurological deficit.    Review of Systems: As mentioned in the history of present illness. All other systems reviewed and are negative. Past Medical History:  Diagnosis Date   Diabetes mellitus without complication ([place])    Diabetic neuropathy ([place])    Hypertension    Stroke [facility])    Urinary incontinence    Past Surgical History:  Procedure Laterality Date   ABDOMINAL HYSTERECTOMY     APPENDECTOMY     BREAST SURGERY     begnin tumor removed   CATARACT EXTRACTION, BILATERAL     Social History:  reports that she has never smoked. She has never used smokeless tobacco. She reports that she does not drink alcohol and does not use drugs.  Allergies  Allergen Reactions   Codeine Nausea And Vomiting    Family History  Problem Relation Age of Onset   CAD Mother    Hypertension Mother    Stroke Father    Heart attack Brother    Cancer Brother     Prior to Admission medications   Medication Sig Start Date End Date Taking? Authorizing Provider  amLODipine (NORVASC) 10 MG tablet Take 5 mg by mouth daily. 01/02/22  Yes [provider]  aspirin 325 MG tablet Take 1 tablet (325 mg total) by mouth daily.  03/02/20  Yes Barton Dubois, MD  Cholecalciferol (VITAMIN D3) 5000 units CAPS Take 1 capsule (5,000 Units total) by mouth daily. Patient taking differently: Take 5,000 Units by mouth every evening. 04/15/17  Yes Nida, Marella Chimes, MD  furosemide (LASIX) 40 MG tablet Take 40 mg by mouth daily as needed for fluid or edema. 12/15/19  Yes [provider]  gabapentin (NEURONTIN) 300 MG capsule TAKE ONE CAPSULE (300MG TOTAL) BY MOUTH THREE TIMES DAILY Patient taking differently: Take 300 mg by mouth 3 (three) times daily. 04/03/21  Yes Nida, Marella Chimes, MD  glipiZIDE (GLUCOTROL XL) 5 MG 24 hr tablet TAKE 1 TABLET BY MOUTH  TWICE DAILY WITH A MEAL Patient taking differently: Take 5 mg by mouth 2 (two) times daily with a meal. 02/09/21  Yes Nida, Marella Chimes, MD  ibuprofen (ADVIL) 800 MG tablet Take 1 tablet (800 mg total) by mouth 3 (three) times daily. Patient taking differently: Take 800 mg by mouth daily as needed for mild pain. 02/16/22  Yes Volney American, PA-C  insulin aspart protamine - aspart (NOVOLOG MIX 70/30 FLEXPEN) (70-30) 100 UNIT/ML FlexPen Inject 0.6 mLs (60 Units total) into the skin 2 (two) times daily before a meal. 02/09/21  Yes Nida, Marella Chimes, MD  Magnesium 200 MG TABS Take 1 tablet (200 mg total) by mouth daily. 11/30/19  Yes Cassandria Anger, MD  metFORMIN (  GLUCOPHAGE) 500 MG tablet TAKE ONE TABLET (500MG TOTAL) BY MOUTH TWO TIMES DAILY AFTER A MEAL Patient taking differently: Take 500 mg by mouth 2 (two) times daily with a meal. 02/09/21  Yes Nida, Marella Chimes, MD  metoprolol succinate (TOPROL-XL) 50 MG 24 hr tablet Take 50 mg by mouth at bedtime. Take with or immediately following a meal.   Yes [provider]  OZEMPIC, 0.25 OR 0.5 MG/DOSE, 2 MG/3ML SOPN Inject 0.5 mg into the skin once a week. 04/27/22  Yes [provider]  simvastatin (ZOCOR) 20 MG tablet TAKE 1 TABLET BY MOUTH IN  THE EVENING Patient taking differently: Take  20 mg by mouth every evening. 01/09/21  Yes Nida, Marella Chimes, MD  solifenacin (VESICARE) 5 MG tablet TAKE ONE TABLT (5 MG TOTAL) BY MOUTH DAILY. Patient taking differently: Take 5 mg by mouth daily. 06/18/22  Yes McKenzie, Candee Furbish, MD  zolpidem (AMBIEN) 10 MG tablet Take 10 mg by mouth at bedtime as needed for sleep. 05/11/20  Yes [provider]  blood glucose meter kit and supplies KIT Dispense based on patient and insurance preference. Use to TEST BLOOD GLUCOSE THREE times daily as directed. DX E11.65 02/10/21   Cassandria Anger, MD  Insulin Pen Needle (PEN NEEDLES) 31G X 8 MM MISC Use as directed to inject insulin once daily 08/15/20   Cassandria Anger, MD    Physical Exam: Vitals:   07/09/22 1630 07/09/22 1700 07/09/22 1800 07/09/22 1905  BP: (!) 197/72 (!) 168/68 (!) 184/85 (!) 152/52  Pulse: 80 80 91 (!) 110  Resp:  '18 18 20  ' Temp:  97.6 F (36.4 C) 98.2 F (36.8 C) 98.5 F (36.9 C)  TempSrc:  Oral Oral   SpO2: 94% 94% 99% 97%  Weight:      Height:          General:  Appears calm and comfortable Eyes:  PERRL, EOMI, normal lids, iris ENT:  grossly normal hearing, lips & tongue, mmm Neck:  no LAD, masses or thyromegaly Cardiovascular:  RRR, no m/r/g. No LE edema.  Respiratory:  CTA bilaterally, no w/r/r. Normal respiratory effort. Abdomen:  soft, ntnd, NABS Skin:  no rash or induration seen on limited exam Musculoskeletal:  grossly normal tone BUE/BLE, good ROM, no bony abnormality Psychiatric:  grossly normal mood and affect, speech fluent and appropriate, AOx3 Neurologic:  CN 2-12 grossly intact, moves all extremities in coordinated fashion, sensation intact    Data Reviewed: CXR w/o acute cardiopulmonary process on my read.  UA consistent w/ UTI  Assessment and Plan: UTI Dehydration Mechanical fall Diabetes Hypertension   UTI: Pt on verge of Sepsis. WBC 18.1, UA frankly positive for infection, Marked Tachycardia, profound weakness and  signs of dehydration at time of admission (though Cr nml)  - Cotninue Ceftriaxone - UCX pending. Consider waiting until UCX returns to DC.  - IVF overnight  Mechanical fall: Resulted in several contusions but no fractures - see imaging results from ED workup. Likely lay on the floor overnight at time of original fall and then in bed for 2 additional days before coming to the ED. No new neurological deficits - OT/PT - CK now and in am.  - Toradol, Robaxin PRN  HTN: Had not had Rx in several days prior to admission due to feeling ill - Continue home Regimen - Hydralazine IV PRN  Neuropathy: - continue Neurontin  DM: - SSI - hold Metformin, Glipizide    Advance Care Planning:  Code Status: Prior FuLL  Consults: NOne  Family Communication: Daughter  Severity of Illness: The appropriate patient status for this patient is OBSERVATION. Observation status is judged to be reasonable and necessary in order to provide the required intensity of service to ensure the patient's safety. The patient's presenting symptoms, physical exam findings, and initial radiographic and laboratory data in the context of their medical condition is felt to place them at decreased risk for further clinical deterioration. Furthermore, it is anticipated that the patient will be medically stable for discharge from the hospital within 2 midnights of admission.   Author: Waldemar Dickens, MD 07/09/2022 7:15 PM  For on call review [email or web address].

## 2022-07-09 NOTE — ED Provider Notes (Signed)
[facility] EMERGENCY DEPARTMENT Provider Note   CSN: [id number] Arrival date & time: 07/09/22  1201     History  Chief Complaint  Patient presents with   Weakness    [name] is a 88 y.o. female.  Patient with hypertension and diabetes.  She fell 2 days ago and was seen in emergency department and had negative head CT and CT face and neck.  Patient has been very weak and bedridden ever since then  The history is provided by the patient. No language interpreter was used.  Weakness Severity:  Moderate Onset quality:  Sudden Timing:  Constant Progression:  Worsening Chronicity:  New Context: not alcohol use   Associated symptoms: no abdominal pain, no chest pain, no cough, no diarrhea, no frequency, no headaches and no seizures        Home Medications Prior to Admission medications   Medication Sig Start Date End Date Taking? Authorizing Provider  amLODipine (NORVASC) 10 MG tablet Take 5 mg by mouth daily. 01/02/22  Yes [provider]  aspirin 325 MG tablet Take 1 tablet (325 mg total) by mouth daily. 03/02/20  Yes Barton Dubois, MD  Cholecalciferol (VITAMIN D3) 5000 units CAPS Take 1 capsule (5,000 Units total) by mouth daily. Patient taking differently: Take 5,000 Units by mouth every evening. 04/15/17  Yes Nida, Marella Chimes, MD  furosemide (LASIX) 40 MG tablet Take 40 mg by mouth daily as needed for fluid or edema. 12/15/19  Yes [provider]  gabapentin (NEURONTIN) 300 MG capsule TAKE ONE CAPSULE ($RemoveBefo'300MG'[id number]$  TOTAL) BY MOUTH THREE TIMES DAILY Patient taking differently: Take 300 mg by mouth 3 (three) times daily. 04/03/21  Yes Nida, Marella Chimes, MD  glipiZIDE (GLUCOTROL XL) 5 MG 24 hr tablet TAKE 1 TABLET BY MOUTH  TWICE DAILY WITH A MEAL Patient taking differently: Take 5 mg by mouth 2 (two) times daily with a meal. 02/09/21  Yes Nida, Marella Chimes, MD  ibuprofen (ADVIL) 800 MG tablet Take 1 tablet (800 mg total) by mouth 3 (three) times  daily. Patient taking differently: Take 800 mg by mouth daily as needed for mild pain. 02/16/22  Yes Volney American, PA-C  insulin aspart protamine - aspart (NOVOLOG MIX 70/30 FLEXPEN) (70-30) 100 UNIT/ML FlexPen Inject 0.6 mLs (60 Units total) into the skin 2 (two) times daily before a meal. 02/09/21  Yes Nida, Marella Chimes, MD  Magnesium 200 MG TABS Take 1 tablet (200 mg total) by mouth daily. 11/30/19  Yes Nida, Marella Chimes, MD  metFORMIN (GLUCOPHAGE) 500 MG tablet TAKE ONE TABLET ($RemoveBef'500MG'SzkwVdPAHA$  TOTAL) BY MOUTH TWO TIMES DAILY AFTER A MEAL Patient taking differently: Take 500 mg by mouth 2 (two) times daily with a meal. 02/09/21  Yes Nida, Marella Chimes, MD  metoprolol succinate (TOPROL-XL) 50 MG 24 hr tablet Take 50 mg by mouth at bedtime. Take with or immediately following a meal.   Yes [provider]  OZEMPIC, 0.25 OR 0.5 MG/DOSE, 2 MG/3ML SOPN Inject 0.5 mg into the skin once a week. 04/27/22  Yes [provider]  simvastatin (ZOCOR) 20 MG tablet TAKE 1 TABLET BY MOUTH IN  THE EVENING Patient taking differently: Take 20 mg by mouth every evening. 01/09/21  Yes Nida, Marella Chimes, MD  solifenacin (VESICARE) 5 MG tablet TAKE ONE TABLT (5 MG TOTAL) BY MOUTH DAILY. Patient taking differently: Take 5 mg by mouth daily. 06/18/22  Yes McKenzie, Candee Furbish, MD  zolpidem (AMBIEN) 10 MG tablet Take 10 mg by mouth  at bedtime as needed for sleep. 05/11/20  Yes [provider]  blood glucose meter kit and supplies KIT Dispense based on patient and insurance preference. Use to TEST BLOOD GLUCOSE THREE times daily as directed. DX E11.65 02/10/21   Cassandria Anger, MD  Insulin Pen Needle (PEN NEEDLES) 31G X 8 MM MISC Use as directed to inject insulin once daily 08/15/20   Cassandria Anger, MD      Allergies    Codeine    Review of Systems   Review of Systems  Constitutional:  Negative for appetite change and fatigue.  HENT:  Negative for congestion, ear discharge  and sinus pressure.   Eyes:  Negative for discharge.  Respiratory:  Negative for cough.   Cardiovascular:  Negative for chest pain.  Gastrointestinal:  Negative for abdominal pain and diarrhea.  Genitourinary:  Negative for frequency and hematuria.  Musculoskeletal:  Negative for back pain.  Skin:  Negative for rash.  Neurological:  Positive for weakness. Negative for seizures and headaches.  Psychiatric/Behavioral:  Negative for hallucinations.     Physical Exam Updated Vital Signs BP (!) 197/106   Pulse (!) 102   Temp 97.7 F (36.5 C) (Oral)   Resp 19   Ht $R'5\' 9"'Hi$  (1.753 m)   Wt 90.3 kg   SpO2 96%   BMI 29.39 kg/m  Physical Exam Vitals and nursing note reviewed.  Constitutional:      Appearance: She is well-developed.  HENT:     Head: Normocephalic.     Nose: Nose normal.  Eyes:     General: No scleral icterus.    Conjunctiva/sclera: Conjunctivae normal.  Neck:     Thyroid: No thyromegaly.  Cardiovascular:     Rate and Rhythm: Normal rate and regular rhythm.     Heart sounds: No murmur heard.    No friction rub. No gallop.  Pulmonary:     Breath sounds: No stridor. No wheezing or rales.  Chest:     Chest wall: No tenderness.  Abdominal:     General: There is no distension.     Tenderness: There is no abdominal tenderness. There is no rebound.  Musculoskeletal:        General: Normal range of motion.     Cervical back: Neck supple.  Lymphadenopathy:     Cervical: No cervical adenopathy.  Skin:    Findings: No erythema or rash.  Neurological:     Mental Status: She is alert and oriented to person, place, and time.     Motor: No abnormal muscle tone.     Coordination: Coordination normal.  Psychiatric:        Behavior: Behavior normal.     ED Results / Procedures / Treatments   Labs (all labs ordered are listed, but only abnormal results are displayed) Labs Reviewed  CBC WITH DIFFERENTIAL/PLATELET - Abnormal; Notable for the following components:       Result Value   WBC 18.1 (*)    RBC 5.22 (*)    Hemoglobin 15.6 (*)    HCT 47.7 (*)    Neutro Abs 14.6 (*)    Monocytes Absolute 2.0 (*)    Abs Immature Granulocytes 0.10 (*)    All other components within normal limits  COMPREHENSIVE METABOLIC PANEL - Abnormal; Notable for the following components:   Glucose, Bld 53 (*)    Albumin 3.3 (*)    Total Bilirubin 1.6 (*)    All other components within normal limits  URINALYSIS, ROUTINE  W REFLEX MICROSCOPIC - Abnormal; Notable for the following components:   APPearance CLOUDY (*)    Hgb urine dipstick SMALL (*)    Ketones, ur 20 (*)    Protein, ur 100 (*)    Nitrite POSITIVE (*)    Leukocytes,Ua MODERATE (*)    WBC, UA >50 (*)    Bacteria, UA MANY (*)    All other components within normal limits  URINE CULTURE  CBG MONITORING, ED    EKG None  Radiology DG Chest Port 1 View  Result Date: 07/09/2022 CLINICAL DATA:  Recent fall, pain, weakness EXAM: PORTABLE CHEST 1 VIEW COMPARISON:  02/29/2020 FINDINGS: The heart size and mediastinal contours are within normal limits. Both lungs are clear. The visualized skeletal structures are unremarkable. IMPRESSION: No active disease. Electronically Signed   By: Elmer Picker M.D.   On: 07/09/2022 13:36   DG Hip Unilat W or Wo Pelvis 2-3 Views Left  Result Date: 07/09/2022 CLINICAL DATA:  Pain EXAM: DG HIP (WITH OR WITHOUT PELVIS) 2-3V LEFT COMPARISON:  None Available. FINDINGS: No recent fracture or dislocation is seen. There is surgical fusion at L4-L5 level in lumbar spine. Vascular calcifications are seen in the soft tissues. IMPRESSION: No fracture or dislocation is seen pelvis and left hip. Electronically Signed   By: Elmer Picker M.D.   On: 07/09/2022 13:34    Procedures Procedures    Medications Ordered in ED Medications  cefTRIAXone (ROCEPHIN) 2 g in sodium chloride 0.9 % 100 mL IVPB (2 g Intravenous New Bag/Given 07/09/22 1533)  sodium chloride 0.9 % bolus 1,000 mL  (1,000 mLs Intravenous Bolus 07/09/22 1333)  dextrose (GLUTOSE) oral gel 40% (31 g Oral Given 07/09/22 1323)    ED Course/ Medical Decision Making/ A&P                           Medical Decision Making Amount and/or Complexity of Data Reviewed Labs: ordered. Radiology: ordered.  Risk OTC drugs. Decision regarding hospitalization.  This patient presents to the ED for concern of weakness, this involves an extensive number of treatment options, and is a complaint that carries with it a high risk of complications and morbidity.  The differential diagnosis includes UTI, worsening diabetes   Co morbidities that complicate the patient evaluation  Diabetes hypertension   Additional history obtained:  Additional history obtained from patient External records from outside source obtained and reviewed including hospital records   Lab Tests:  I Ordered, and personally interpreted labs.  The pertinent results include: UA shows urinary tract infection, white count elevated at 18,000   Imaging Studies ordered:  I ordered imaging studies including left hip and chest x-ray I independently visualized and interpreted imaging which showed unremarkable I agree with the radiologist interpretation   Cardiac Monitoring: / EKG:  The patient was maintained on a cardiac monitor.  I personally viewed and interpreted the cardiac monitored which showed an underlying rhythm of: Normal sinus rhythm   Consultations Obtained:  I requested consultation with the hospitalist,  and discussed lab and imaging findings as well as pertinent plan - they recommend: Admission for UTI   Problem List / ED Course / Critical interventions / Medication management  Diabetes hypertension UTI I ordered medication including Rocephin for UTI Reevaluation of the patient after these medicines showed that the patient stayed the same I have reviewed the patients home medicines and have made adjustments as  needed   Social Determinants of  Health:  None   Test / Admission - Considered:  No additional test necessary  Patient will be admitted for urinary tract infection        Final Clinical Impression(s) / ED Diagnoses Final diagnoses:  Acute cystitis with hematuria    Rx / DC Orders ED Discharge Orders     None         Milton Ferguson, MD 07/12/22 1029

## 2022-07-09 NOTE — ED Triage Notes (Signed)
Patient brought in by [facility] for possible UTI and weakness.  Patient was seen here on Saturday for a fall and hasn't been out of the bed since Saturday when she was discharged.  Patients CBG 60.

## 2022-07-09 NOTE — ED Notes (Addendum)
Pts BP elevated, 197/106--pt states she takes BP meds but has not taken her medications since Saturday, MD made aware

## 2022-07-09 NOTE — ED Notes (Signed)
A qualified RN to attempt ultrasound IV at bedside

## 2022-07-10 DIAGNOSIS — J9600 Acute respiratory failure, unspecified whether with hypoxia or hypercapnia: Secondary | ICD-10-CM

## 2022-07-10 LAB — CBC
HCT: 39.3 % (ref 36.0–46.0)
Hemoglobin: 12.9 g/dL (ref 12.0–15.0)
MCH: 29.8 pg (ref 26.0–34.0)
MCHC: 32.8 g/dL (ref 30.0–36.0)
MCV: 90.8 fL (ref 80.0–100.0)
Platelets: 275 10*3/uL (ref 150–400)
RBC: 4.33 MIL/uL (ref 3.87–5.11)
RDW: 12.7 % (ref 11.5–15.5)
WBC: 12.6 10*3/uL — ABNORMAL HIGH (ref 4.0–10.5)
nRBC: 0 % (ref 0.0–0.2)

## 2022-07-10 LAB — GLUCOSE, CAPILLARY
Glucose-Capillary: 150 mg/dL — ABNORMAL HIGH (ref 70–99)
Glucose-Capillary: 255 mg/dL — ABNORMAL HIGH (ref 70–99)
Glucose-Capillary: 198 mg/dL — ABNORMAL HIGH (ref 70–99)
Glucose-Capillary: 176 mg/dL — ABNORMAL HIGH (ref 70–99)

## 2022-07-10 LAB — COMPREHENSIVE METABOLIC PANEL
ALT: 16 U/L (ref 0–44)
AST: 17 U/L (ref 15–41)
Albumin: 2.5 g/dL — ABNORMAL LOW (ref 3.5–5.0)
Alkaline Phosphatase: 90 U/L (ref 38–126)
Anion gap: 10 (ref 5–15)
BUN: 26 mg/dL — ABNORMAL HIGH (ref 8–23)
CO2: 21 mmol/L — ABNORMAL LOW (ref 22–32)
Calcium: 8.9 mg/dL (ref 8.9–10.3)
Chloride: 108 mmol/L (ref 98–111)
Creatinine, Ser: 0.75 mg/dL (ref 0.44–1.00)
GFR, Estimated: >60 mL/min (ref >60)
Glucose, Bld: 170 mg/dL — ABNORMAL HIGH (ref 70–99)
Potassium: 3.7 mmol/L (ref 3.5–5.1)
Sodium: 139 mmol/L (ref 135–145)
Total Bilirubin: 0.8 mg/dL (ref 0.3–1.2)
Total Protein: 6 g/dL — ABNORMAL LOW (ref 6.5–8.1)

## 2022-07-10 LAB — HEMOGLOBIN A1C
Hgb A1c MFr Bld: 5.1 % (ref 4.8–5.6)
Mean Plasma Glucose: 99.67 mg/dL

## 2022-07-10 LAB — CK: Total CK: 34 U/L — ABNORMAL LOW (ref 38–234)

## 2022-07-10 NOTE — Progress Notes (Signed)
PROGRESS NOTE    [name]  NUU:[id number] DOB: Apr 29, 1929 DOA: 07/09/2022 PCP: Sharilyn Sites, MD   Brief Narrative:    [name] is a 88 y.o. female with medical history significant of DM, HTN, Neuropathy, CVA, Urinary incontinence. Pt presenting w/ several day h/o progressive weakness and fall.  Patient was admitted with mechanical fall with contusions, but no syncopal episode.  She is also noted to have UTI.  PT now recommending SNF on discharge.  Assessment & Plan:   Principal Problem:   Acute respiratory failure ([place]) Active Problems:   UTI (urinary tract infection)  Assessment and Plan:  UTI: Pt on verge of Sepsis. WBC 18.1, UA frankly positive for infection, Marked Tachycardia, profound weakness and signs of dehydration at time of admission (though Cr nml)  - Cotninue Ceftriaxone - UCX pending. Consider waiting until UCX returns to DC.  - IVF overnight, discontinue now   Mechanical fall: Resulted in several contusions but no fractures - see imaging results from ED workup. Likely lay on the floor overnight at time of original fall and then in bed for 2 additional days before coming to the ED. No new neurological deficits - OT/PT recommending SNF on discharge - CK level is low - Toradol, Robaxin PRN   HTN: Had not had Rx in several days prior to admission due to feeling ill - Continue home Regimen - Hydralazine IV PRN   Neuropathy: - continue Neurontin   DM: - SSI for now -Hemoglobin A1c 5.1% - hold Metformin, Glipizide -Follow-up with endocrinology outpatient and anticipate decreasing outpatient medications on discharge   DVT prophylaxis: Heparin Code Status: Full Family Communication: None at bedside Disposition Plan:  Status is: Observation The patient will require care spanning > 2 midnights and should be moved to inpatient because: SNF placement, IV medications for UTI.   Consultants:  None  Procedures:  None  Antimicrobials:   Anti-infectives (From admission, onward)    Start     Dose/Rate Route Frequency Ordered Stop   07/10/22 1200  cefTRIAXone (ROCEPHIN) 1 g in sodium chloride 0.9 % 100 mL IVPB        1 g 200 mL/hr over 30 Minutes Intravenous Every 24 hours 07/09/22 1931     07/09/22 1530  cefTRIAXone (ROCEPHIN) 2 g in sodium chloride 0.9 % 100 mL IVPB        2 g 200 mL/hr over 30 Minutes Intravenous  Once 07/09/22 1523 07/09/22 1643       Subjective: Patient seen and evaluated today with no new acute complaints or concerns. No acute concerns or events noted overnight.  Objective: Vitals:   07/09/22 1800 07/09/22 1905 07/09/22 2047 07/10/22 0459  BP: (!) 184/85 (!) 152/52 (!) 188/53 (!) 148/56  Pulse: 91 (!) 110 82 62  Resp: '18 20 15 16  '$ Temp: 98.2 F (36.8 C) 98.5 F (36.9 C) 99.1 F (37.3 C) 98.2 F (36.8 C)  TempSrc: Oral     SpO2: 99% 97% 96% 98%  Weight:      Height:        Intake/Output Summary (Last 24 hours) at 07/10/2022 1215 Last data filed at 07/10/2022 3474 Gross per 24 hour  Intake 847.22 ml  Output 200 ml  Net 647.22 ml   Filed Weights   07/09/22 1206  Weight: 90.3 kg    Examination:  General exam: Appears calm and comfortable  Respiratory system: Clear to auscultation. Respiratory effort normal. Cardiovascular system: S1 & S2 heard, RRR.  Gastrointestinal  system: Abdomen is soft Central nervous system: Alert and awake Extremities: No edema Skin: No significant lesions noted Psychiatry: Flat affect.    Data Reviewed: I have personally reviewed following labs and imaging studies  CBC: Recent Labs  Lab 07/09/22 1318 07/09/22 2046 07/10/22 0524  WBC 18.1* 15.4* 12.6*  NEUTROABS 14.6*  --   --   HGB 15.6* 13.8 12.9  HCT 47.7* 42.8 39.3  MCV 91.4 93.2 90.8  PLT 303 298 016   Basic Metabolic Panel: Recent Labs  Lab 07/09/22 1318 07/09/22 2046 07/10/22 0524  NA 139  --  139  K 4.0  --  3.7  CL 104  --  108  CO2 23  --  21*  GLUCOSE 53*  --  170*   BUN 22  --  26*  CREATININE 0.86 0.74 0.75  CALCIUM 9.7  --  8.9   GFR: Estimated Creatinine Clearance: 55.9 mL/min (by C-G formula based on SCr of 0.75 mg/dL). Liver Function Tests: Recent Labs  Lab 07/09/22 1318 07/10/22 0524  AST 25 17  ALT 19 16  ALKPHOS 100 90  BILITOT 1.6* 0.8  PROT 7.6 6.0*  ALBUMIN 3.3* 2.5*   No results for input(s): "LIPASE", "AMYLASE" in the last 168 hours. No results for input(s): "AMMONIA" in the last 168 hours. Coagulation Profile: No results for input(s): "INR", "PROTIME" in the last 168 hours. Cardiac Enzymes: Recent Labs  Lab 07/09/22 2046 07/10/22 0524  CKTOTAL 44 34*   BNP (last 3 results) No results for input(s): "PROBNP" in the last 8760 hours. HbA1C: Recent Labs    07/09/22 2046  HGBA1C 5.1   CBG: Recent Labs  Lab 07/09/22 1426 07/10/22 0737 07/10/22 1118  GLUCAP 73 150* 255*   Lipid Profile: No results for input(s): "CHOL", "HDL", "LDLCALC", "TRIG", "CHOLHDL", "LDLDIRECT" in the last 72 hours. Thyroid Function Tests: No results for input(s): "TSH", "T4TOTAL", "FREET4", "T3FREE", "THYROIDAB" in the last 72 hours. Anemia Panel: No results for input(s): "VITAMINB12", "FOLATE", "FERRITIN", "TIBC", "IRON", "RETICCTPCT" in the last 72 hours. Sepsis Labs: No results for input(s): "PROCALCITON", "LATICACIDVEN" in the last 168 hours.  No results found for this or any previous visit (from the past 240 hour(s)).       Radiology Studies: DG Chest Port 1 View  Result Date: 07/09/2022 CLINICAL DATA:  Recent fall, pain, weakness EXAM: PORTABLE CHEST 1 VIEW COMPARISON:  02/29/2020 FINDINGS: The heart size and mediastinal contours are within normal limits. Both lungs are clear. The visualized skeletal structures are unremarkable. IMPRESSION: No active disease. Electronically Signed   By: Elmer Picker M.D.   On: 07/09/2022 13:36   DG Hip Unilat W or Wo Pelvis 2-3 Views Left  Result Date: 07/09/2022 CLINICAL DATA:  Pain  EXAM: DG HIP (WITH OR WITHOUT PELVIS) 2-3V LEFT COMPARISON:  None Available. FINDINGS: No recent fracture or dislocation is seen. There is surgical fusion at L4-L5 level in lumbar spine. Vascular calcifications are seen in the soft tissues. IMPRESSION: No fracture or dislocation is seen pelvis and left hip. Electronically Signed   By: Elmer Picker M.D.   On: 07/09/2022 13:34        Scheduled Meds:  amLODipine  5 mg Oral Daily   aspirin  325 mg Oral Daily   darifenacin  7.5 mg Oral Daily   gabapentin  300 mg Oral TID   heparin  5,000 Units Subcutaneous Q8H   insulin aspart  0-15 Units Subcutaneous TID WC   metoprolol succinate  50 mg  Oral QHS   simvastatin  20 mg Oral QPM   Continuous Infusions:  cefTRIAXone (ROCEPHIN)  IV 1 g (07/10/22 1205)   methocarbamol (ROBAXIN) IV       LOS: 0 days    Time spent: 35 minutes    Meenakshi Sazama Darleen Crocker, DO [facility]  If 7PM-7AM, please contact night-coverage [email or web address] 07/10/2022, 12:15 PM

## 2022-07-10 NOTE — Progress Notes (Signed)
Patient slept the whole night with no complaint of pain. She took medications one at a time with no issue.

## 2022-07-10 NOTE — Plan of Care (Signed)
  Problem: Acute Rehab OT Goals (only OT should resolve) Goal: Pt. Will Perform Grooming Flowsheets (Taken 07/10/2022 0947) Pt Will Perform Grooming:  with modified independence  sitting Goal: Pt. Will Perform Lower Body Bathing Flowsheets (Taken 07/10/2022 0947) Pt Will Perform Lower Body Bathing:  with min guard assist  sitting/lateral leans  with adaptive equipment Goal: Pt. Will Perform Upper Body Dressing Flowsheets (Taken 07/10/2022 0947) Pt Will Perform Upper Body Dressing:  with min guard assist  sitting Goal: Pt. Will Perform Lower Body Dressing Flowsheets (Taken 07/10/2022 0947) Pt Will Perform Lower Body Dressing:  with min assist  sitting/lateral leans  with adaptive equipment Goal: Pt. Will Transfer To Toilet Flowsheets (Taken 07/10/2022 0947) Pt Will Transfer to Toilet:  with mod assist  stand pivot transfer Goal: Pt. Will Perform Toileting-Clothing Manipulation Flowsheets (Taken 07/10/2022 0947) Pt Will Perform Toileting - Clothing Manipulation and hygiene:  with min guard assist  sitting/lateral leans Goal: Pt/Caregiver Will Perform Home Exercise Program Flowsheets (Taken 07/10/2022 [phone]) Pt/caregiver will Perform Home Exercise Program:  Increased ROM  Increased strength  Both right and left upper extremity  With Supervision  Sisto Granillo OT, MOT

## 2022-07-10 NOTE — Evaluation (Signed)
Occupational Therapy Evaluation Patient Details Name: [name] MRN: [id number] DOB: 1928/12/28 Today's Date: 07/10/2022   History of Present Illness [name] is a 88 y.o. female with medical history significant of DM, HTN, Neuropathy, CVA, Urinary incontinence. Pt presenting w/ several day h/o progressive weakenss and fall. Fall occurred w/o syncope. She did strike her head/eye. Fall occurred when she missed sitting on her bed. Pt seen in the ED on day of fall (7/15) and sent home. Pt has been unable to get out of bed since the fall and developed further weakness since that time. No CP, SOB, LE swelling above baseline, HA, focal neurological deficit. (per MD)   Clinical Impression   Pt agreeable to OT and pt co-evaluation evaluation. Pt's husband present and agreed and assisted in reporting prior living history. Pt rquired max A for bed mobility today with increased pain reported primarily in L LE. Pt demonstrates very limited shoulder A/ROM of shoulder with limited P/ROM to ~75% of available range of shoulder flexion. Pt appeared fearful of transfer today but agreed to attempts sit to stand needing Max A +2. Pt reported independent anterior transfer to w/c at baseline. Pt overall is weak and needs much assist at this time for transfers and ADL's. Pt will benefit from continued OT in the hospital and recommended venue below to increase strength, balance, and endurance for safe ADL's.        Recommendations for follow up therapy are one component of a multi-disciplinary discharge planning process, led by the attending physician.  Recommendations may be updated based on patient status, additional functional criteria and insurance authorization.   Follow Up Recommendations  Skilled nursing-short term rehab (<3 hours/day)    Assistance Recommended at Discharge Intermittent Supervision/Assistance  Patient can return home with the following Two people to help with walking and/or  transfers;A lot of help with bathing/dressing/bathroom;Assistance with cooking/housework;Help with stairs or ramp for entrance;Assist for transportation    Functional Status Assessment  Patient has had a recent decline in their functional status and demonstrates the ability to make significant improvements in function in a reasonable and predictable amount of time.  Equipment Recommendations  None recommended by OT    Recommendations for Other Services       Precautions / Restrictions Precautions Precautions: Fall Restrictions Weight Bearing Restrictions: No      Mobility Bed Mobility Overal bed mobility: Needs Assistance Bed Mobility: Supine to Sit, Sit to Supine     Supine to sit: Max assist Sit to supine: Max assist   General bed mobility comments: Pt very weak needing much assist with incrased pain with bed mobility.    Transfers Overall transfer level: Needs assistance   Transfers: Sit to/from Stand Sit to Stand: Max assist, +2 physical assistance           General transfer comment: Pt required max A +2 for sit to stand without AD per pt's report of typical transfers without AD to w/c.      Balance Overall balance assessment: Needs assistance Sitting-balance support: Bilateral upper extremity supported, Feet supported Sitting balance-Leahy Scale: Fair Sitting balance - Comments: seated EOB   Standing balance support: Bilateral upper extremity supported Standing balance-Leahy Scale: Poor Standing balance comment: Assisted by therapist's on either side using gait belt to stand.                           ADL either performed or assessed with clinical judgement  ADL Overall ADL's : Needs assistance/impaired Eating/Feeding: Set up;Bed level   Grooming: Minimal assistance;Moderate assistance;Sitting   Upper Body Bathing: Minimal assistance;Moderate assistance;Sitting   Lower Body Bathing: Maximal assistance;Sitting/lateral leans   Upper Body  Dressing : Minimal assistance;Moderate assistance;Sitting   Lower Body Dressing: Maximal assistance;Sitting/lateral leans Lower Body Dressing Details (indicate cue type and reason): Max A to don socks weated at [employer] Transfer: Maximal assistance;+2 for physical assistance Toilet Transfer Details (indicate cue type and reason): partially simulated via sit to stand at [place] and Hygiene: Maximal assistance;Bed level               Vision Baseline Vision/History: 1 Wears glasses Ability to See in Adequate Light: 1 Impaired Patient Visual Report: No change from baseline (Pt reports need for an eye appointment.) Vision Assessment?: No apparent visual deficits (other than baseline)                Pertinent Vitals/Pain Pain Assessment Pain Assessment: Faces Faces Pain Scale: Hurts even more Pain Location: L LE primarily Pain Descriptors / Indicators: Grimacing, Guarding Pain Intervention(s): Limited activity within patient's tolerance, Monitored during session, Repositioned     Hand Dominance Right   Extremity/Trunk Assessment Upper Extremity Assessment Upper Extremity Assessment: LUE deficits/detail;RUE deficits/detail RUE Deficits / Details: 2/-5 shoulder movement reclined in bed. ~75% P/ROM for shoulder flexion. Generaly weak otherwise. LUE Deficits / Details: 2/-5 shoulder movement reclined in bed. ~75% P/ROM for shoulder flexion. Generaly weak otherwise.   Lower Extremity Assessment Lower Extremity Assessment: Defer to PT evaluation       Communication Communication Communication: No difficulties   Cognition Arousal/Alertness: Awake/alert Behavior During Therapy: WFL for tasks assessed/performed Overall Cognitive Status: Within Functional Limits for tasks assessed                                                        Home Living Family/patient expects to be discharged to:: Private residence Living  Arrangements: Spouse/significant other;Children Available Help at Discharge: Family;Available 24 hours/day Type of Home: House Home Access: Ramped entrance     Home Layout: Two level Alternate Level Stairs-Number of Steps:  (stays on first level)   Bathroom Shower/Tub: Occupational psychologist: Handicapped height Bathroom Accessibility: Yes How Accessible: Accessible via wheelchair Home Equipment: [place] (2 wheels);BSC/3in1;Shower seat;Grab bars - toilet;Grab bars - tub/shower;Wheelchair - manual          Prior Functioning/Environment Prior Level of Function : Needs assist       Physical Assist : ADLs (physical);Mobility (physical) Mobility (physical): Gait ADLs (physical): IADLs Mobility Comments: Pt reports household mobility with w/c. Pt reports independent transfers to w/c from EOB. ADLs Comments: Pt reports independence with ADL's with husband assisting with IADL's.        OT Problem List: Decreased strength;Decreased range of motion;Decreased activity tolerance;Impaired balance (sitting and/or standing);Pain      OT Treatment/Interventions: Self-care/ADL training;Therapeutic exercise;Therapeutic activities;Patient/family education;Balance training    OT Goals(Current goals can be found in the care plan section) Acute Rehab OT Goals Patient Stated Goal: Wants to try standing in a couple days. OT Goal Formulation: With patient Time For Goal Achievement: 07/24/22 Potential to Achieve Goals: Fair  OT Frequency: Min 2X/week    Co-evaluation PT/OT/SLP Co-Evaluation/Treatment: Yes Reason for Co-Treatment: To address functional/ADL transfers  OT goals addressed during session: ADL's and self-care      AM-PAC OT "6 Clicks" Daily Activity     Outcome Measure Help from another person eating meals?: A Little Help from another person taking care of personal grooming?: A Little Help from another person toileting, which includes using toliet, bedpan, or  urinal?: A Lot Help from another person bathing (including washing, rinsing, drying)?: A Lot Help from another person to put on and taking off regular upper body clothing?: A Lot Help from another person to put on and taking off regular lower body clothing?: A Lot 6 Click Score: 14   End of Session Equipment Utilized During Treatment: Gait belt  Activity Tolerance: Patient limited by pain Patient left: in bed;with call bell/phone within reach  OT Visit Diagnosis: Unsteadiness on feet (R26.81);Other abnormalities of gait and mobility (R26.89);Muscle weakness (generalized) (M62.81);History of falling (Z91.81);Pain Pain - Right/Left: Left Pain - part of body: Leg                Time: 3154-0086 OT Time Calculation (min): 33 min Charges:  OT General Charges $OT Visit: 1 Visit OT Evaluation $OT Eval Low Complexity: 1 Low  Kimo Bancroft OT, MOT  Larey Seat 07/10/2022, 9:44 AM

## 2022-07-10 NOTE — Progress Notes (Signed)
Telemetry called patient had a 6 beat run of non-sustained V-tach, Dr. Josephine Cables notified, no new orders at this time.

## 2022-07-10 NOTE — Inpatient Diabetes Management (Addendum)
Inpatient Diabetes Program Recommendations  AACE/ADA: New Consensus Statement on Inpatient Glycemic Control  Target Ranges:  Prepandial:   less than 140 mg/dL      Peak postprandial:   less than 180 mg/dL (1-2 hours)      Critically ill patients:  140 - 180 mg/dL    Latest Reference Range & Units 07/09/22 14:26 07/10/22 07:37  Glucose-Capillary 70 - 99 mg/dL 73 150 (H)    Latest Reference Range & Units 07/09/22 13:18  Glucose 70 - 99 mg/dL 53 (L)    Latest Reference Range & Units 07/09/22 20:46  Hemoglobin A1C 4.8 - 5.6 % 5.1   Review of Glycemic Control  Diabetes history: DM2 Outpatient Diabetes medications: 70/30 60 units BID, Glipizide 5 mg BID, Metformin 500 mg BID Current orders for Inpatient glycemic control: Novolog 0-15 units TID with meals  Inpatient Diabetes Program Recommendations:    HbgA1C:  A1C 5.1% on 07/09/22 indicating an average glucose of 100 mg/dl over the past 2-3 months.  Outpatient DM medications: Initial glucose 53 mg/dl on 07/09/22 and A1C is 5.1%. Given that patient is 88 years old, came in with hypoglycemia, has a very low A1C, and that she had a fall at home, would recommend to decrease outpatient DM medications and have patient follow up with Dr. Garnet Koyanagi (Endocrinologist) as soon as possible.  Addendum 07/10/22'@13'$ :45-Spoke to patient over the phone regarding DM control. Patient reports that she is taking DM medications as prescribed. She reports she is consistently taking 70/30 insulin twice a day but can not tell me the dose (stated she has it written down at home). Patient reports that she checks her glucose at home (suppose to check 3 times a day but not checking that often). She reports she was having an issue with her glucometer and she recently got a new glucometer. Patient states that she is not having frequent hypoglycemia and reports that she gets symptoms when glucose is in the 40's mg/dl. Expressed concern that initial glucose was 53 mg/dl on arrival  to hospital and questioned if fall due to lows. Patient states that her fall has nothing to do with her glucose. Patient states that she can not recall her last A1C. Discussed current A1C of 5.1% on 07/09/22 indicating an average glucose of 100 mg/dl. Expressed that given her age, frequent falls, her initial glucose of 53 mg/dl, her glucose may be going low and perhaps she does not get symptoms. Patient states that she takes medication like her doctor tells her too and she will continue to do so and talk with her doctor about her DM and see if her doctor wants her to make any changes. Encouraged patient to reach out to her doctor and follow up in the near future. Patient verbalized understanding and has no questions at this time.   Thanks, Barnie Alderman, RN, MSN, [place] Diabetes Coordinator Inpatient Diabetes Program [phone] (Team Pager from 8am to [place])

## 2022-07-10 NOTE — TOC Initial Note (Signed)
Transition of Care [facility]) - Initial/Assessment Note    Patient Details  Name: [name] MRN: [id number] Date of Birth: 1928/12/25  [facility] CM/SW Contact:    Ihor Gully, LCSW Phone Number: 07/10/2022, 2:26 PM  Clinical Narrative:                 Patient from home with spouse. Admitted for acute respiratory. At baseline, patient uses a wc and is able to stand and pivot. She is vaccinated for COVID per daughter. PT recommends SNF. Family is agreeable. Referred to SNFs in area as requested.   Expected Discharge Plan: Skilled Nursing Facility Barriers to Discharge: Continued Medical Work up   Patient Goals and CMS Choice Patient states their goals for this hospitalization and ongoing recovery are:: rehab then home      Expected Discharge Plan and Services Expected Discharge Plan: [place]       Living arrangements for the past 2 months: Single Family Home                                      Prior Living Arrangements/Services Living arrangements for the past 2 months: Single Family Home Lives with:: Spouse Patient language and need for interpreter reviewed:: Yes Do you feel safe going back to the place where you live?: Yes      Need for Family Participation in Patient Care: Yes (Comment) Care giver support system in place?: Yes (comment)   Criminal Activity/Legal Involvement Pertinent to Current Situation/Hospitalization: No - Comment as needed  Activities of Daily Living Home Assistive Devices/Equipment: Wheelchair, Bedside commode/3-in-1, Shower chair with back ADL Screening (condition at time of admission) Patient's cognitive ability adequate to safely complete daily activities?: Yes Is the patient deaf or have difficulty hearing?: No Does the patient have difficulty seeing, even when wearing glasses/contacts?: No Does the patient have difficulty concentrating, remembering, or making decisions?: No Patient able  to express need for assistance with ADLs?: Yes Does the patient have difficulty dressing or bathing?: Yes Independently performs ADLs?: No Communication: Independent Dressing (OT): Needs assistance Is this a change from baseline?: Pre-admission baseline Grooming: Needs assistance Is this a change from baseline?: Pre-admission baseline Feeding: Independent Bathing: Needs assistance (can get into shower with assitance and then bathes self) Is this a change from baseline?: Pre-admission baseline Toileting: Independent In/Out Bed: Independent with device (comment) Walks in Home: Dependent (per patient and gfamliy patinet does not ambulate, just transfers to wc and then rolls around) Is this a change from baseline?: Pre-admission baseline Does the patient have difficulty walking or climbing stairs?: Yes Weakness of Legs: Both Weakness of Arms/Hands: Both  Permission Sought/Granted Permission sought to share information with : Family Supports    Share Information with NAME: Butch Penny, daughter           Emotional Assessment       Orientation: : Oriented to Place, Oriented to Self Alcohol / Substance Use: Not Applicable Psych Involvement: No (comment)  Admission diagnosis:  Acute respiratory failure ([place]) [J96.00] UTI (urinary tract infection) [N39.0] Acute cystitis with hematuria [N30.01] Patient Active Problem List   Diagnosis Date Noted   Acute respiratory failure ([place]) 07/09/2022   UTI (urinary tract infection) 07/09/2022   Functional gait abnormality 09/25/2021   Late effects of cerebrovascular disease 09/25/2021   Long term (current) use of insulin ([place]) 09/25/2021   Overactive bladder 10/03/2020  Acute arterial ischemic stroke, vertebrobasilar, thalamic, right ([place])    TIA (transient ischemic attack) 02/29/2020   DKA, type 2 ([place]) 07/14/2019   Uncontrolled type 2 diabetes mellitus with hyperglycemia, with long-term current use of insulin ([place]) 30/86/5784   Acute  metabolic encephalopathy 69/62/9528   Cellulitis, leg 07/14/2019   AMS (altered mental status) 07/14/2019   Altered mental status 07/13/2019   Acute encephalopathy 03/24/2019   Hypotension 02/07/2019   Slurred speech 11/17/2018   Diabetic neuropathy ([place]) 11/17/2018   AKI (acute kidney injury) ([place]) 11/17/2018   Uncontrolled type 2 diabetes mellitus with hyperglycemia ([place]) 06/11/2018   Hyperlipidemia 02/20/2016   Essential hypertension 10/10/2015   Vitamin D deficiency 10/10/2015   Class 1 obesity due to excess calories without serious comorbidity with body mass index (BMI) of 31.0 to 31.9 in adult 10/10/2015   HIP PAIN 01/12/2009   SPINAL STENOSIS 01/12/2009   LOW BACK PAIN 01/12/2009   SPONDYLOLYSIS 01/12/2009   SHOULDER PAIN 10/04/2008   Uncontrolled type 2 diabetes mellitus with complication, without long-term current use of insulin 10/01/2008   PCP:  Sharilyn Sites, MD Pharmacy:   [facility], [place] [address] Phone: [phone] Fax: [phone]  [facility] ([place], [place] [facility] [address] [place] [address] [place] [address] Phone: [phone] Fax: [phone]  [facility] [address], [place] - [place] [address] [place] [address] [place] [address] Phone: [phone] Fax: [phone]  [facility] - [place], [place] [address] Phone: [phone] Fax: [phone]     Social Determinants of Health (SDOH) Interventions    Readmission Risk Interventions     No data to display

## 2022-07-10 NOTE — Evaluation (Signed)
Physical Therapy Evaluation Patient Details Name: [name] MRN: [id number] DOB: 13-Jun-1929 Today's Date: 07/10/2022  History of Present Illness  [name] is a 88 y.o. female with medical history significant of DM, HTN, Neuropathy, CVA, Urinary incontinence. Pt presenting w/ several day h/o progressive weakenss and fall. Fall occurred w/o syncope. She did strike her head/eye. Fall occurred when she missed sitting on her bed. Pt seen in the ED on day of fall (7/15) and sent home. Pt has been unable to get out of bed since the fall and developed further weakness since that time. No CP, SOB, LE swelling above baseline, HA, focal neurological deficit.   Clinical Impression  Patient presents supine in bed with husband present in room. Patient consents to PT/OT co-evaluation. Patient is mod/max assist with bed mobility. Extended time needed with slow labored movement demonstrated. Requires verbal cueing. Generalized weakness observed with patient primarily needing assist due to pain/discomfort of B LE and impaired coordination secondary to pain. Requires LE assist with sit to supine in returning to bed. Sit to stand transfer with no AD attempted with patient requiring max assist. Able to initiate sit to stand but struggles to reach upright position primarily due to B LE pain and fatigue. L LE assessed with pain occurring with L ankle DF in PROM and palpation of L calve. Reports discomfort with R LE in PROM as well. Defer to OT evaluation for UE assessment. Patient left in supine in bed with nursing staff notified of mobility status. Patient will benefit from continued skilled physical therapy in hospital and recommended venue below to increase strength, balance, endurance for safe ADLs and gait.      Recommendations for follow up therapy are one component of a multi-disciplinary discharge planning process, led by the attending physician.  Recommendations may be updated based on patient  status, additional functional criteria and insurance authorization.  Follow Up Recommendations Skilled nursing-short term rehab (<3 hours/day) Can patient physically be transported by private vehicle: No    Assistance Recommended at Discharge Set up Supervision/Assistance  Patient can return home with the following  A lot of help with walking and/or transfers;Help with stairs or ramp for entrance;Assist for transportation;Assistance with cooking/housework    Equipment Recommendations None recommended by PT  Recommendations for Other Services       Functional Status Assessment Patient has had a recent decline in their functional status and demonstrates the ability to make significant improvements in function in a reasonable and predictable amount of time.     Precautions / Restrictions Precautions Precautions: Fall Restrictions Weight Bearing Restrictions: No      Mobility  Bed Mobility Overal bed mobility: Needs Assistance Bed Mobility: Supine to Sit, Sit to Supine     Supine to sit: Max assist, Mod assist Sit to supine: Mod assist, Max assist   General bed mobility comments: Mod/max assist with bed mobility. Extended time needed with slow labored movement. Decreased trunk control. Generalized weakness observed with patient needing assist primarily due to pain and decreased coordination. Needs LE assist with sit to supine.    Transfers Overall transfer level: Needs assistance   Transfers: Sit to/from Stand Sit to Stand: Max assist           General transfer comment: Max assist with attempted sit to stand and transfers wihtout AD. Gait belt utilized. Able to initiate sit to stand but cannot reach upright position primarily due to pain and generalized weakness.    Ambulation/Gait  Stairs            Wheelchair Mobility    Modified Rankin (Stroke Patients Only)       Balance Overall balance assessment: Needs  assistance Sitting-balance support: Bilateral upper extremity supported, Feet supported Sitting balance-Leahy Scale: Fair Sitting balance - Comments: seated EOB   Standing balance support: Bilateral upper extremity supported Standing balance-Leahy Scale: Poor Standing balance comment: Assisted by therapist's on either side using gait belt to stand. Limited by pain/discomfort and not able to reach full upright position.                             Pertinent Vitals/Pain Pain Assessment Pain Assessment: Faces Faces Pain Scale: Hurts even more Pain Location: L LE primarily Pain Descriptors / Indicators: Grimacing, Guarding Pain Intervention(s): Limited activity within patient's tolerance, Monitored during session, Repositioned    Home Living Family/patient expects to be discharged to:: Private residence Living Arrangements: Spouse/significant other;Children Available Help at Discharge: Family;Available 24 hours/day Type of Home: House Home Access: Ramped entrance     Alternate Level Stairs-Number of Steps:  (stays on first level) Home Layout: Two level Home Equipment: Rolling Walker (2 wheels);BSC/3in1;Shower seat;Grab bars - toilet;Grab bars - tub/shower;Wheelchair - manual      Prior Function Prior Level of Function : Needs assist       Physical Assist : ADLs (physical);Mobility (physical) Mobility (physical): Gait ADLs (physical): IADLs Mobility Comments: Pt reports household mobility with w/c. Pt reports independent transfers to w/c from EOB. ADLs Comments: Pt reports independence with ADL's with husband assisting with IADL's.     Hand Dominance   Dominant Hand: Right    Extremity/Trunk Assessment   Upper Extremity Assessment Upper Extremity Assessment: Defer to OT evaluation RUE Deficits / Details: 2/-5 shoulder movement reclined in bed. ~75% P/ROM for shoulder flexion. Generaly weak otherwise. LUE Deficits / Details: 2/-5 shoulder movement reclined in  bed. ~75% P/ROM for shoulder flexion. Generaly weak otherwise.    Lower Extremity Assessment Lower Extremity Assessment: LLE deficits/detail LLE Deficits / Details: Painful L ankle DF in PROM LLE: Unable to fully assess due to pain    Cervical / Trunk Assessment Cervical / Trunk Assessment: Normal  Communication   Communication: No difficulties  Cognition Arousal/Alertness: Awake/alert Behavior During Therapy: WFL for tasks assessed/performed Overall Cognitive Status: Within Functional Limits for tasks assessed                                          General Comments      Exercises     Assessment/Plan    PT Assessment Patient needs continued PT services;All further PT needs can be met in the next venue of care  PT Problem List Decreased strength;Decreased range of motion;Decreased activity tolerance;Decreased balance;Decreased mobility;Decreased coordination       PT Treatment Interventions DME instruction;Gait training;Functional mobility training;Therapeutic activities;Therapeutic exercise;Balance training    PT Goals (Current goals can be found in the Care Plan section)  Acute Rehab PT Goals Patient Stated Goal: return home PT Goal Formulation: With patient Time For Goal Achievement: 07/10/22 Potential to Achieve Goals: Fair    Frequency Min 3X/week     Co-evaluation PT/OT/SLP Co-Evaluation/Treatment: Yes Reason for Co-Treatment: To address functional/ADL transfers PT goals addressed during session: Mobility/safety with mobility;Balance;Proper use of DME;Strengthening/ROM OT goals addressed during session: ADL's and self-care  AM-PAC PT "6 Clicks" Mobility  Outcome Measure Help needed turning from your back to your side while in a flat bed without using bedrails?: A Lot Help needed moving from lying on your back to sitting on the side of a flat bed without using bedrails?: A Lot Help needed moving to and from a bed to a chair  (including a wheelchair)?: A Lot Help needed standing up from a chair using your arms (e.g., wheelchair or bedside chair)?: A Lot Help needed to walk in hospital room?: Total Help needed climbing 3-5 steps with a railing? : Total 6 Click Score: 10    End of Session Equipment Utilized During Treatment: Gait belt Activity Tolerance: Patient tolerated treatment well;Patient limited by pain;Patient limited by fatigue Patient left: in bed;with call bell/phone within reach Nurse Communication: Mobility status PT Visit Diagnosis: Unsteadiness on feet (R26.81);Other abnormalities of gait and mobility (R26.89);Muscle weakness (generalized) (M62.81)    Time: 4471-5806 PT Time Calculation (min) (ACUTE ONLY): 30 min   Charges:   PT Evaluation $PT Eval Moderate Complexity: 1 Mod PT Treatments $Therapeutic Activity: 23-37 mins        10:18 AM, 07/10/22 Lestine Box, S/PT

## 2022-07-10 NOTE — NC FL2 (Signed)
[place] LEVEL OF CARE SCREENING TOOL     IDENTIFICATION  Patient Name: [name] Birthdate: 24-Oct-1929 Sex: female Admission Date (Current Location): 07/09/2022  [facility] and [place] Number:  [employer] and Address:  [facility] [address], [place]      Provider Number: [id number]  Attending Physician Name and Address:  Rodena Goldmann, DO  Relative Name and Phone Number:  Havanna, Groner (Spouse)   [phone]    Current Level of Care: Hospital (observation) Recommended Level of Care: [place] Prior Approval Number:    Date Approved/Denied:   PASRR Number: [id number]  Discharge Plan: SNF    Current Diagnoses: Patient Active Problem List   Diagnosis Date Noted   Acute respiratory failure ([place]) 07/09/2022   UTI (urinary tract infection) 07/09/2022   Functional gait abnormality 09/25/2021   Late effects of cerebrovascular disease 09/25/2021   Long term (current) use of insulin ([place]) 09/25/2021   Overactive bladder 10/03/2020   Acute arterial ischemic stroke, vertebrobasilar, thalamic, right (HCC)    TIA (transient ischemic attack) 02/29/2020   DKA, type 2 ([place]) 07/14/2019   Uncontrolled type 2 diabetes mellitus with hyperglycemia, with long-term current use of insulin ([place]) 16/96/7893   Acute metabolic encephalopathy 81/12/7508   Cellulitis, leg 07/14/2019   AMS (altered mental status) 07/14/2019   Altered mental status 07/13/2019   Acute encephalopathy 03/24/2019   Hypotension 02/07/2019   Slurred speech 11/17/2018   Diabetic neuropathy ([place]) 11/17/2018   AKI (acute kidney injury) ([place]) 11/17/2018   Uncontrolled type 2 diabetes mellitus with hyperglycemia ([place]) 06/11/2018   Hyperlipidemia 02/20/2016   Essential hypertension 10/10/2015   Vitamin D deficiency 10/10/2015   Class 1 obesity due to excess calories without serious comorbidity with body mass index (BMI) of 31.0 to 31.9 in  adult 10/10/2015   HIP PAIN 01/12/2009   SPINAL STENOSIS 01/12/2009   LOW BACK PAIN 01/12/2009   SPONDYLOLYSIS 01/12/2009   SHOULDER PAIN 10/04/2008   Uncontrolled type 2 diabetes mellitus with complication, without long-term current use of insulin 10/01/2008    Orientation RESPIRATION BLADDER Height & Weight     Self, Place  Normal Incontinent Weight: 199 lb (90.3 kg) Height:  '5\' 9"'$  (175.3 cm)  BEHAVIORAL SYMPTOMS/MOOD NEUROLOGICAL BOWEL NUTRITION STATUS      Incontinent Diet (regular)  AMBULATORY STATUS COMMUNICATION OF NEEDS Skin   Limited Assist   Normal                       Personal Care Assistance Level of Assistance  Bathing, Feeding, Dressing Bathing Assistance: Limited assistance Feeding assistance: Independent Dressing Assistance: Limited assistance     Functional Limitations Info  Sight, Hearing, Speech Sight Info: Adequate Hearing Info: Adequate Speech Info: Adequate    SPECIAL CARE FACTORS FREQUENCY  PT (By licensed PT)     PT Frequency: 5x/week              Contractures Contractures Info: Not present    Additional Factors Info  Code Status, Allergies Code Status Info: Full Code Allergies Info: Codeine           Current Medications (07/10/2022):  This is the current hospital active medication list Current Facility-Administered Medications  Medication Dose Route Frequency Provider Last Rate Last Admin   acetaminophen (TYLENOL) tablet 650 mg  650 mg Oral Q6H PRN Waldemar Dickens, MD       Or   acetaminophen (TYLENOL) suppository  650 mg  650 mg Rectal Q6H PRN Waldemar Dickens, MD       amLODipine (NORVASC) tablet 5 mg  5 mg Oral Daily Waldemar Dickens, MD   5 mg at 07/10/22 6222   aspirin tablet 325 mg  325 mg Oral Daily Waldemar Dickens, MD   325 mg at 07/10/22 0854   cefTRIAXone (ROCEPHIN) 1 g in sodium chloride 0.9 % 100 mL IVPB  1 g Intravenous Q24H Waldemar Dickens, MD 200 mL/hr at 07/10/22 1205 1 g at 07/10/22 1205   darifenacin  (ENABLEX) 24 hr tablet 7.5 mg  7.5 mg Oral Daily Waldemar Dickens, MD   7.5 mg at 07/10/22 9798   furosemide (LASIX) tablet 40 mg  40 mg Oral Daily PRN Waldemar Dickens, MD       gabapentin (NEURONTIN) capsule 300 mg  300 mg Oral TID Waldemar Dickens, MD   300 mg at 07/10/22 0854   heparin injection 5,000 Units  5,000 Units Subcutaneous Q8H Waldemar Dickens, MD   5,000 Units at 07/10/22 1355   hydrALAZINE (APRESOLINE) injection 5-10 mg  5-10 mg Intravenous Q4H PRN Waldemar Dickens, MD       insulin aspart (novoLOG) injection 0-15 Units  0-15 Units Subcutaneous TID WC Waldemar Dickens, MD   8 Units at 07/10/22 1202   ketorolac (TORADOL) 15 MG/ML injection 15 mg  15 mg Intravenous Q6H PRN Waldemar Dickens, MD       methocarbamol (ROBAXIN) 500 mg in dextrose 5 % 50 mL IVPB  500 mg Intravenous Q6H PRN Waldemar Dickens, MD       metoprolol succinate (TOPROL-XL) 24 hr tablet 50 mg  50 mg Oral QHS Waldemar Dickens, MD   50 mg at 07/09/22 2102   ondansetron (ZOFRAN) tablet 4 mg  4 mg Oral Q6H PRN Waldemar Dickens, MD       Or   ondansetron [facility]) injection 4 mg  4 mg Intravenous Q6H PRN Waldemar Dickens, MD       simvastatin (ZOCOR) tablet 20 mg  20 mg Oral QPM Waldemar Dickens, MD   20 mg at 07/09/22 2102   zolpidem (AMBIEN) tablet 5 mg  5 mg Oral QHS PRN Waldemar Dickens, MD         Discharge Medications: Please see discharge summary for a list of discharge medications.  Relevant Imaging Results:  Relevant Lab Results:   Additional Information SSN [id number]. Per daughter, patient is vaccinated for COVID  Quinn Bartling, Clydene Pugh, LCSW

## 2022-07-10 NOTE — Plan of Care (Signed)
  Problem: Acute Rehab PT Goals(only PT should resolve) Goal: Pt Will Go Supine/Side To Sit Outcome: Progressing Flowsheets (Taken 07/10/2022 1018) Pt will go Supine/Side to Sit:  with moderate assist  with minimal assist Goal: Patient Will Transfer Sit To/From Stand Outcome: Progressing Flowsheets (Taken 07/10/2022 1018) Patient will transfer sit to/from stand:  with maximum assist  with moderate assist Goal: Pt Will Transfer Bed To Chair/Chair To Bed Outcome: Progressing Flowsheets (Taken 07/10/2022 1018) Pt will Transfer Bed to Chair/Chair to Bed:  with mod assist  with min assist Goal: Pt Will Perform Standing Balance Or Pre-Gait Outcome: Progressing Flowsheets (Taken 07/10/2022 1018) Pt will perform standing balance or pre-gait:  1-2 min  with bilateral UE support  with maximum assist  with moderate assist Goal: Pt Will Ambulate Outcome: Progressing Flowsheets (Taken 07/10/2022 1018) Pt will Ambulate:  with moderate assist  with minimal assist  with rolling walker  10 feet   10:19 AM, 07/10/22 Lestine Box, S/PT

## 2022-07-11 DIAGNOSIS — E1140 Type 2 diabetes mellitus with diabetic neuropathy, unspecified: Secondary | ICD-10-CM | POA: Diagnosis not present

## 2022-07-11 DIAGNOSIS — I10 Essential (primary) hypertension: Secondary | ICD-10-CM | POA: Diagnosis not present

## 2022-07-11 DIAGNOSIS — Z9181 History of falling: Secondary | ICD-10-CM | POA: Diagnosis not present

## 2022-07-11 DIAGNOSIS — I152 Hypertension secondary to endocrine disorders: Secondary | ICD-10-CM | POA: Diagnosis not present

## 2022-07-11 DIAGNOSIS — E1142 Type 2 diabetes mellitus with diabetic polyneuropathy: Secondary | ICD-10-CM | POA: Diagnosis not present

## 2022-07-11 DIAGNOSIS — E559 Vitamin D deficiency, unspecified: Secondary | ICD-10-CM | POA: Diagnosis not present

## 2022-07-11 DIAGNOSIS — R262 Difficulty in walking, not elsewhere classified: Secondary | ICD-10-CM | POA: Diagnosis not present

## 2022-07-11 DIAGNOSIS — N3281 Overactive bladder: Secondary | ICD-10-CM | POA: Diagnosis not present

## 2022-07-11 DIAGNOSIS — E1165 Type 2 diabetes mellitus with hyperglycemia: Secondary | ICD-10-CM | POA: Diagnosis not present

## 2022-07-11 DIAGNOSIS — E785 Hyperlipidemia, unspecified: Secondary | ICD-10-CM | POA: Diagnosis not present

## 2022-07-11 DIAGNOSIS — F0150 Vascular dementia without behavioral disturbance: Secondary | ICD-10-CM | POA: Diagnosis not present

## 2022-07-11 DIAGNOSIS — J9620 Acute and chronic respiratory failure, unspecified whether with hypoxia or hypercapnia: Secondary | ICD-10-CM | POA: Diagnosis not present

## 2022-07-11 DIAGNOSIS — I700 Atherosclerosis of aorta: Secondary | ICD-10-CM | POA: Diagnosis not present

## 2022-07-11 DIAGNOSIS — M170 Bilateral primary osteoarthritis of knee: Secondary | ICD-10-CM | POA: Diagnosis not present

## 2022-07-11 DIAGNOSIS — R278 Other lack of coordination: Secondary | ICD-10-CM | POA: Diagnosis not present

## 2022-07-11 DIAGNOSIS — I6990 Unspecified sequelae of unspecified cerebrovascular disease: Secondary | ICD-10-CM | POA: Diagnosis not present

## 2022-07-11 DIAGNOSIS — N390 Urinary tract infection, site not specified: Secondary | ICD-10-CM | POA: Diagnosis not present

## 2022-07-11 DIAGNOSIS — R531 Weakness: Secondary | ICD-10-CM | POA: Diagnosis not present

## 2022-07-11 DIAGNOSIS — Z741 Need for assistance with personal care: Secondary | ICD-10-CM | POA: Diagnosis not present

## 2022-07-11 DIAGNOSIS — E1143 Type 2 diabetes mellitus with diabetic autonomic (poly)neuropathy: Secondary | ICD-10-CM | POA: Diagnosis not present

## 2022-07-11 DIAGNOSIS — N3000 Acute cystitis without hematuria: Secondary | ICD-10-CM | POA: Diagnosis not present

## 2022-07-11 DIAGNOSIS — K5909 Other constipation: Secondary | ICD-10-CM | POA: Diagnosis not present

## 2022-07-11 DIAGNOSIS — M6281 Muscle weakness (generalized): Secondary | ICD-10-CM | POA: Diagnosis not present

## 2022-07-11 DIAGNOSIS — M179 Osteoarthritis of knee, unspecified: Secondary | ICD-10-CM | POA: Diagnosis not present

## 2022-07-11 DIAGNOSIS — E1159 Type 2 diabetes mellitus with other circulatory complications: Secondary | ICD-10-CM | POA: Diagnosis not present

## 2022-07-11 DIAGNOSIS — J9600 Acute respiratory failure, unspecified whether with hypoxia or hypercapnia: Secondary | ICD-10-CM | POA: Diagnosis not present

## 2022-07-11 DIAGNOSIS — I639 Cerebral infarction, unspecified: Secondary | ICD-10-CM | POA: Diagnosis not present

## 2022-07-11 DIAGNOSIS — G9341 Metabolic encephalopathy: Secondary | ICD-10-CM | POA: Diagnosis not present

## 2022-07-11 DIAGNOSIS — R600 Localized edema: Secondary | ICD-10-CM | POA: Diagnosis not present

## 2022-07-11 DIAGNOSIS — E1169 Type 2 diabetes mellitus with other specified complication: Secondary | ICD-10-CM | POA: Diagnosis not present

## 2022-07-11 LAB — BASIC METABOLIC PANEL
Anion gap: 8 (ref 5–15)
BUN: 31 mg/dL — ABNORMAL HIGH (ref 8–23)
CO2: 22 mmol/L (ref 22–32)
Calcium: 8.9 mg/dL (ref 8.9–10.3)
Chloride: 108 mmol/L (ref 98–111)
Creatinine, Ser: 0.74 mg/dL (ref 0.44–1.00)
GFR, Estimated: >60 mL/min (ref >60)
Glucose, Bld: 122 mg/dL — ABNORMAL HIGH (ref 70–99)
Potassium: 3.6 mmol/L (ref 3.5–5.1)
Sodium: 138 mmol/L (ref 135–145)

## 2022-07-11 LAB — CBC
HCT: 38.8 % (ref 36.0–46.0)
Hemoglobin: 12.4 g/dL (ref 12.0–15.0)
MCH: 29.5 pg (ref 26.0–34.0)
MCHC: 32 g/dL (ref 30.0–36.0)
MCV: 92.4 fL (ref 80.0–100.0)
Platelets: 285 10*3/uL (ref 150–400)
RBC: 4.2 MIL/uL (ref 3.87–5.11)
RDW: 12.9 % (ref 11.5–15.5)
WBC: 10.5 10*3/uL (ref 4.0–10.5)
nRBC: 0 % (ref 0.0–0.2)

## 2022-07-11 LAB — GLUCOSE, CAPILLARY
Glucose-Capillary: 137 mg/dL — ABNORMAL HIGH (ref 70–99)
Glucose-Capillary: 225 mg/dL — ABNORMAL HIGH (ref 70–99)

## 2022-07-11 LAB — MAGNESIUM: Magnesium: 2.3 mg/dL (ref 1.7–2.4)

## 2022-07-11 MED ORDER — CIPROFLOXACIN HCL 500 MG PO TABS: 500.0000 mg | ORAL_TABLET | Freq: Two times a day (BID) | ORAL | 0 refills | Status: AC

## 2022-07-11 MED ORDER — SENNOSIDES-DOCUSATE SODIUM 8.6-50 MG PO TABS
1.0000 | ORAL_TABLET | Freq: Every day | ORAL | Status: DC
Start: 2022-07-11 — End: 2022-07-11
  Administered 2022-07-11: 1 via ORAL
  Filled 2022-07-11: qty 1

## 2022-07-11 MED ORDER — SENNOSIDES-DOCUSATE SODIUM 8.6-50 MG PO TABS: 1.0000 | ORAL_TABLET | Freq: Every day | ORAL | 0 refills | Status: AC

## 2022-07-11 MED ORDER — ONDANSETRON HCL 4 MG PO TABS: 4.0000 mg | ORAL_TABLET | Freq: Four times a day (QID) | ORAL | 0 refills | Status: DC | PRN

## 2022-07-11 NOTE — Care Management Important Message (Signed)
Important Message  Patient Details  Name: [name] MRN: [id number] Date of Birth: 07-13-1929   Medicare Important Message Given:  N/A - LOS <3 / Initial given by admissions     Tommy Medal 07/11/2022, 11:19 AM

## 2022-07-11 NOTE — Hospital Course (Addendum)
[name] is a 88 y.o. female with medical history significant of DM, HTN, Neuropathy, CVA, Urinary incontinence. Pt presenting w/ several day h/o progressive weakness and fall.  Patient was admitted with mechanical fall with contusions, but no syncopal episode.  She is also noted to have UTI.  PT now recommending SNF on discharge.   Assessment & Plan:   Principal Problem:   Acute respiratory failure ([place]) Active Problems:   UTI (urinary tract infection)   Assessment and Plan:   UTI: Pt on verge of Sepsis. WBC 18.1, UA frankly positive for infection, Marked Tachycardia, profound weakness and signs of dehydration at time of admission (though Cr nml)  - Cotninue Ceftriaxone x 2 days we will switch to p.o. ciprofloxacin for 3 more days - UCX: Grew Klebsiella-pansensitive - IVF overnight, discontinued    Mechanical fall: Resulted in several contusions but no fractures - see imaging results from ED workup. Likely lay on the floor overnight at time of original fall and then in bed for 2 additional days before coming to the ED. No new neurological deficits - OT/PT recommending SNF on discharge - CK level is low - Toradol, Robaxin PRN was utilized during this admission   HTN: Had not had Rx in several days prior to admission due to feeling ill - Continue home Regimen    Neuropathy: - continue Neurontin   DM: - SSI for now -Hemoglobin A1c 5.1% -Discontinuing metformin, and glipizide -Patient is to continue SSI coverage, home medication insulin 70/30 and Ozempic -Follow-up with endocrinology outpatient and anticipate decreasing outpatient medications

## 2022-07-11 NOTE — Plan of Care (Signed)

## 2022-07-11 NOTE — Discharge Summary (Addendum)
Physician Discharge Summary   Patient: [name] MRN: [id number] DOB: [birth year]  Admit date:     07/09/2022  Discharge date: 07/11/22  Discharge Physician: Deatra James   PCP: Sharilyn Sites, MD   Recommendations at discharge:   Recommend Increase oral hydration Continue current course of antibiotics Aggressive PT OT, fall precautions DuoNeb bronchodilators as needed Follow-up with a PCP within 1 week Commending follow-up with palliative care  Discharge Diagnoses: Principal Problem:   Acute respiratory failure ([place]) Active Problems:   UTI (urinary tract infection)  Resolved Problems:   * No resolved hospital problems. *  Hospital Course: [name] is a 88 y.o. female with medical history significant of DM, HTN, Neuropathy, CVA, Urinary incontinence. Pt presenting w/ several day h/o progressive weakness and fall.  Patient was admitted with mechanical fall with contusions, but no syncopal episode.  She is also noted to have UTI.  PT now recommending SNF on discharge.   Assessment & Plan:   Principal Problem:   Acute respiratory failure ([place]) Active Problems:   UTI (urinary tract infection)   Assessment and Plan:   UTI: Pt on verge of Sepsis. WBC 18.1, UA frankly positive for infection, Marked Tachycardia, profound weakness and signs of dehydration at time of admission (though Cr nml)  - Cotninue Ceftriaxone x 2 days we will switch to p.o. ciprofloxacin for 3 more days - UCX: Grew Klebsiella-pansensitive - IVF overnight, discontinued    Mechanical fall: Resulted in several contusions but no fractures - see imaging results from ED workup. Likely lay on the floor overnight at time of original fall and then in bed for 2 additional days before coming to the ED. No new neurological deficits - OT/PT recommending SNF on discharge - CK level is low - Toradol, Robaxin PRN was utilized during this admission   HTN: Had not had Rx in several days prior to  admission due to feeling ill - Continue home Regimen    Neuropathy: - continue Neurontin   DM: - SSI for now -Hemoglobin A1c 5.1% -Discontinuing metformin, and glipizide -Patient is to continue SSI coverage, home medication insulin 70/30 and Ozempic -Follow-up with endocrinology outpatient and anticipate decreasing outpatient medications    Consultants: none  Procedures performed: none   Disposition: Skilled nursing facility Diet recommendation:  Discharge Diet Orders (From admission, onward)     Start     Ordered   07/11/22 0000  Diet - low sodium heart healthy        07/11/22 1136           Cardiac and Carb modified diet DISCHARGE MEDICATION: Allergies as of 07/11/2022       Reactions   Codeine Nausea And Vomiting        Medication List     STOP taking these medications    glipiZIDE 5 MG 24 hr tablet Commonly known as: GLUCOTROL XL   metFORMIN 500 MG tablet Commonly known as: GLUCOPHAGE       TAKE these medications    amLODipine 10 MG tablet Commonly known as: NORVASC Take 5 mg by mouth daily.   aspirin 325 MG tablet Take 1 tablet (325 mg total) by mouth daily.   blood glucose meter kit and supplies Kit Dispense based on patient and insurance preference. Use to TEST BLOOD GLUCOSE THREE times daily as directed. DX E11.65   ciprofloxacin 500 MG tablet Commonly known as: Cipro Take 1 tablet (500 mg total) by mouth 2 (two) times daily for 3  days.   furosemide 40 MG tablet Commonly known as: LASIX Take 40 mg by mouth daily as needed for fluid or edema.   gabapentin 300 MG capsule Commonly known as: NEURONTIN TAKE ONE CAPSULE (300MG TOTAL) BY MOUTH THREE TIMES DAILY What changed: See the new instructions.   ibuprofen 800 MG tablet Commonly known as: ADVIL Take 1 tablet (800 mg total) by mouth 3 (three) times daily. What changed:  when to take this reasons to take this   insulin aspart 100 UNIT/ML injection Commonly known as:  novoLOG Inject 0-15 Units into the skin 3 (three) times daily with meals.   Magnesium 200 MG Tabs Take 1 tablet (200 mg total) by mouth daily.   metoprolol succinate 50 MG 24 hr tablet Commonly known as: TOPROL-XL Take 50 mg by mouth at bedtime. Take with or immediately following a meal.   NovoLOG Mix 70/30 FlexPen (70-30) 100 UNIT/ML FlexPen Generic drug: insulin aspart protamine - aspart Inject 0.6 mLs (60 Units total) into the skin 2 (two) times daily before a meal.   ondansetron 4 MG tablet Commonly known as: ZOFRAN Take 1 tablet (4 mg total) by mouth every 6 (six) hours as needed for nausea.   Ozempic (0.25 or 0.5 MG/DOSE) 2 MG/3ML Sopn Generic drug: Semaglutide(0.25 or 0.5MG/DOS) Inject 0.5 mg into the skin once a week.   Pen Needles 31G X 8 MM Misc Use as directed to inject insulin once daily   senna-docusate 8.6-50 MG tablet Commonly known as: Senokot-S Take 1 tablet by mouth at bedtime.   simvastatin 20 MG tablet Commonly known as: ZOCOR TAKE 1 TABLET BY MOUTH IN  THE EVENING   solifenacin 5 MG tablet Commonly known as: VESICARE TAKE ONE TABLT (5 MG TOTAL) BY MOUTH DAILY. What changed: See the new instructions.   Vitamin D3 125 MCG (5000 UT) Caps Take 1 capsule (5,000 Units total) by mouth daily. What changed: when to take this   zolpidem 10 MG tablet Commonly known as: AMBIEN Take 10 mg by mouth at bedtime as needed for sleep.        Discharge Exam: Filed Weights   07/09/22 1206  Weight: 90.3 kg      Physical Exam:   General:  AAO x 3,  cooperative, no distress;   HEENT:  Normocephalic, PERRL, otherwise with in Normal limits   Neuro:  CNII-XII intact. , normal motor and sensation, reflexes intact   Lungs:   Clear to auscultation BL, Respirations unlabored,  No wheezes / crackles  Cardio:    S1/S2, RRR, No murmure, No Rubs or Gallops   Abdomen:  Soft, non-tender, bowel sounds active all four quadrants, no guarding or peritoneal signs.   Muscular  skeletal:  Limited exam -global generalized weaknesses - in bed, able to move all 4 extremities,   2+ pulses,  symmetric, No pitting edema  Skin:  Dry, warm to touch, negative for any Rashes,  Wounds: Please see nursing documentation          Condition at discharge: good  The results of significant diagnostics from this hospitalization (including imaging, microbiology, ancillary and laboratory) are listed below for reference.   Imaging Studies: DG Chest Port 1 View  Result Date: 07/09/2022 CLINICAL DATA:  Recent fall, pain, weakness EXAM: PORTABLE CHEST 1 VIEW COMPARISON:  02/29/2020 FINDINGS: The heart size and mediastinal contours are within normal limits. Both lungs are clear. The visualized skeletal structures are unremarkable. IMPRESSION: No active disease. Electronically Signed   By: Royston Cowper  Rathinasamy M.D.   On: 07/09/2022 13:36   DG Hip Unilat W or Wo Pelvis 2-3 Views Left  Result Date: 07/09/2022 CLINICAL DATA:  Pain EXAM: DG HIP (WITH OR WITHOUT PELVIS) 2-3V LEFT COMPARISON:  None Available. FINDINGS: No recent fracture or dislocation is seen. There is surgical fusion at L4-L5 level in lumbar spine. Vascular calcifications are seen in the soft tissues. IMPRESSION: No fracture or dislocation is seen pelvis and left hip. Electronically Signed   By: Elmer Picker M.D.   On: 07/09/2022 13:34   CT Maxillofacial Wo Contrast  Result Date: 07/07/2022 CLINICAL DATA:  Head trauma. Fall getting into bed last night. Was unable to get herself up. Additional fall several days ago with black eye. EXAM: CT MAXILLOFACIAL WITHOUT CONTRAST TECHNIQUE: Multidetector CT imaging of the maxillofacial structures was performed. Multiplanar CT image reconstructions were also generated. RADIATION DOSE REDUCTION: This exam was performed according to the departmental dose-optimization program which includes automated exposure control, adjustment of the mA and/or kV according to patient size  and/or use of iterative reconstruction technique. COMPARISON:  CT head 02/29/2020 FINDINGS: Osseous: No fracture or mandibular dislocation. No destructive process. Advanced degenerative changes are present at the TMJ bilaterally. No residual maxillary teeth are present. Orbits: Bilateral lens replacements are noted. Globes and orbits are otherwise unremarkable. Sinuses: The paranasal sinuses and mastoid air cells are clear. Soft tissues: Soft tissues the face are unremarkable. No acute soft tissue injury. IMPRESSION: 1. No acute trauma to the face. 2. Advanced degenerative changes at the TMJ bilaterally. Electronically Signed   By: San Morelle M.D.   On: 07/07/2022 13:26   CT Cervical Spine Wo Contrast  Result Date: 07/07/2022 CLINICAL DATA:  Fall getting into bed last night. Patient was unable to get up after the fall. EXAM: CT CERVICAL SPINE WITHOUT CONTRAST TECHNIQUE: Multidetector CT imaging of the cervical spine was performed without intravenous contrast. Multiplanar CT image reconstructions were also generated. RADIATION DOSE REDUCTION: This exam was performed according to the departmental dose-optimization program which includes automated exposure control, adjustment of the mA and/or kV according to patient size and/or use of iterative reconstruction technique. COMPARISON:  MRI of the cervical spine 03/18/2015 FINDINGS: Alignment: No significant listhesis present. Straightening of the normal cervical lordosis is similar the prior exam. Skull base and vertebrae: Craniocervical junction is within normal limits. Prominent pannus again seen. No acute fractures are present. Soft tissues and spinal canal: No prevertebral fluid or swelling. No visible canal hematoma. Disc levels: Multilevel degenerative changes are present throughout the cervical spine. Foraminal narrowing is greatest on the left at C4-5 and on the right at C6-7 due to uncovertebral spurring. Upper chest: Lung apices are clear. Thoracic  inlet is within normal limits. Advanced degenerative changes are present at the right sternoclavicular joint. IMPRESSION: 1. No acute fracture or traumatic subluxation. 2. Multilevel degenerative changes as described. Electronically Signed   By: San Morelle M.D.   On: 07/07/2022 13:24   CT Head Wo Contrast  Result Date: 07/07/2022 CLINICAL DATA:  Head trauma. Fall getting into bed last night. Was unable to get herself up. Additional fall several days ago with black eye. EXAM: CT HEAD WITHOUT CONTRAST TECHNIQUE: Contiguous axial images were obtained from the base of the skull through the vertex without intravenous contrast. RADIATION DOSE REDUCTION: This exam was performed according to the departmental dose-optimization program which includes automated exposure control, adjustment of the mA and/or kV according to patient size and/or use of iterative reconstruction technique. COMPARISON:  MR head without contrast 03/01/2020. CT head without contrast 02/29/2020. FINDINGS: Brain: Moderate atrophy and white matter disease is stable. No acute infarct, hemorrhage, or mass lesion is present. The ventricles are proportionate to the degree of atrophy. No significant extraaxial fluid collection is present. The brainstem and cerebellum are within normal limits. Vascular: Atherosclerotic calcifications are present within the cavernous internal carotid arteries. No hyperdense vessel is present. Skull: Scalp soft tissue swelling is present near the vertex, asymmetric to the left. No underlying fracture or foreign body is present. Sinuses/Orbits: The paranasal sinuses and mastoid air cells are clear. The globes and orbits are within normal limits. IMPRESSION: 1. Scalp soft tissue swelling near the vertex, asymmetric to the left. No underlying fracture or foreign body. 2. Stable atrophy and white matter disease. This likely reflects the sequela of chronic microvascular ischemia. 3. Atherosclerosis. Electronically Signed    By: San Morelle M.D.   On: 07/07/2022 13:21   DG Pelvis 1-2 Views  Result Date: 07/07/2022 CLINICAL DATA:  Fall last night EXAM: PELVIS - 1-2 VIEW COMPARISON:  None Available. FINDINGS: No pelvic fracture or diastasis. No hip dislocation on this single frontal view. Right hip is positioned in internal rotation. Right L4-5 spinal fusion hardware with no evidence of hardware fracture or loosening. Degenerative changes in the visualized lower lumbar spine. Mild bilateral hip osteoarthritis. No suspicious focal osseous lesions. Surgical clips in the right deep pelvis. IMPRESSION: No pelvic fracture. No hip dislocation on this single frontal view. Mild bilateral hip osteoarthritis. Electronically Signed   By: Ilona Sorrel M.D.   On: 07/07/2022 12:57   DG Lumbar Spine Complete  Result Date: 07/07/2022 CLINICAL DATA:  Fall last night EXAM: LUMBAR SPINE - COMPLETE 4+ VIEW COMPARISON:  06/13/2011 lumbar spine radiographs FINDINGS: This report assumes 5 non rib-bearing lumbar vertebrae. Status post right lumbar fusion at L4-5 with no evidence of hardware fracture or loosening. Bone cage in place in the L4-5 disc space. Lumbar vertebral body heights are preserved, with no fracture. Marked multilevel lumbar degenerative disc disease, most prominent at L2-3. Minimal 2 mm retrolisthesis at L1-2, L2-3 and L3-4. Mild bilateral lower lumbar facet arthropathy. No aggressive appearing focal osseous lesions. Abdominal aortic atherosclerosis. Surgical clips in the right deep pelvis. IMPRESSION: 1. No lumbar spine fracture or acute malalignment. 2. Status post right lumbar fusion at L4-5 without evidence of hardware complication. 3. Marked multilevel lumbar degenerative disc disease with minimal multilevel lumbar retrolisthesis as detailed. Electronically Signed   By: Ilona Sorrel M.D.   On: 07/07/2022 12:55    Microbiology: Results for orders placed or performed in visit on 10/25/21  Microscopic Examination      Status: Abnormal   Collection Time: 10/25/21  2:50 PM   Urine  Result Value Ref Range Status   WBC, UA 0-5 0 - 5 /hpf Final   RBC, Urine 3-10 (A) 0 - 2 /hpf Final   Epithelial Cells (non renal) 0-10 0 - 10 /hpf Final   Renal Epithel, UA None seen None seen /hpf Final   Bacteria, UA Few None seen/Few Final    Labs: CBC: Recent Labs  Lab 07/09/22 1318 07/09/22 2046 07/10/22 0524 07/11/22 0418  WBC 18.1* 15.4* 12.6* 10.5  NEUTROABS 14.6*  --   --   --   HGB 15.6* 13.8 12.9 12.4  HCT 47.7* 42.8 39.3 38.8  MCV 91.4 93.2 90.8 92.4  PLT 303 298 275 659   Basic Metabolic Panel: Recent Labs  Lab 07/09/22 1318 07/09/22  2046 07/10/22 0524 07/11/22 0418  NA 139  --  139 138  K 4.0  --  3.7 3.6  CL 104  --  108 108  CO2 23  --  21* 22  GLUCOSE 53*  --  170* 122*  BUN 22  --  26* 31*  CREATININE 0.86 0.74 0.75 0.74  CALCIUM 9.7  --  8.9 8.9  MG  --   --   --  2.3   Liver Function Tests: Recent Labs  Lab 07/09/22 1318 07/10/22 0524  AST 25 17  ALT 19 16  ALKPHOS 100 90  BILITOT 1.6* 0.8  PROT 7.6 6.0*  ALBUMIN 3.3* 2.5*   CBG: Recent Labs  Lab 07/10/22 1118 07/10/22 1554 07/10/22 2139 07/11/22 0729 07/11/22 1103  GLUCAP 255* 198* 176* 137* 225*    Discharge time spent: greater than 30 minutes.  Signed: Deatra James, MD [facility] 07/11/2022

## 2022-07-12 ENCOUNTER — Encounter: Payer: Self-pay | Admitting: Adult Health

## 2022-07-12 ENCOUNTER — Non-Acute Institutional Stay (SKILLED_NURSING_FACILITY): Payer: Medicare Other | Admitting: Adult Health

## 2022-07-12 DIAGNOSIS — N3281 Overactive bladder: Secondary | ICD-10-CM

## 2022-07-12 DIAGNOSIS — E1142 Type 2 diabetes mellitus with diabetic polyneuropathy: Secondary | ICD-10-CM | POA: Diagnosis not present

## 2022-07-12 DIAGNOSIS — F5104 Psychophysiologic insomnia: Secondary | ICD-10-CM

## 2022-07-12 DIAGNOSIS — E1159 Type 2 diabetes mellitus with other circulatory complications: Secondary | ICD-10-CM | POA: Diagnosis not present

## 2022-07-12 DIAGNOSIS — I152 Hypertension secondary to endocrine disorders: Secondary | ICD-10-CM

## 2022-07-12 DIAGNOSIS — N3000 Acute cystitis without hematuria: Secondary | ICD-10-CM

## 2022-07-12 DIAGNOSIS — J9600 Acute respiratory failure, unspecified whether with hypoxia or hypercapnia: Secondary | ICD-10-CM | POA: Diagnosis not present

## 2022-07-12 DIAGNOSIS — F0150 Vascular dementia without behavioral disturbance: Secondary | ICD-10-CM

## 2022-07-12 DIAGNOSIS — K5909 Other constipation: Secondary | ICD-10-CM | POA: Diagnosis not present

## 2022-07-12 DIAGNOSIS — E785 Hyperlipidemia, unspecified: Secondary | ICD-10-CM

## 2022-07-12 DIAGNOSIS — R600 Localized edema: Secondary | ICD-10-CM

## 2022-07-12 DIAGNOSIS — I6990 Unspecified sequelae of unspecified cerebrovascular disease: Secondary | ICD-10-CM

## 2022-07-12 DIAGNOSIS — M170 Bilateral primary osteoarthritis of knee: Secondary | ICD-10-CM | POA: Diagnosis not present

## 2022-07-12 DIAGNOSIS — I700 Atherosclerosis of aorta: Secondary | ICD-10-CM

## 2022-07-12 DIAGNOSIS — E1169 Type 2 diabetes mellitus with other specified complication: Secondary | ICD-10-CM

## 2022-07-12 DIAGNOSIS — F0390 Unspecified dementia without behavioral disturbance: Secondary | ICD-10-CM

## 2022-07-12 DIAGNOSIS — E1140 Type 2 diabetes mellitus with diabetic neuropathy, unspecified: Secondary | ICD-10-CM

## 2022-07-12 LAB — URINE CULTURE: Culture: >=100000 — AB

## 2022-07-12 NOTE — Progress Notes (Signed)
Location:  [facility] Room Number: 131-P Place of Service:  SNF (31)   CODE STATUS: Full Code  Allergies  Allergen Reactions   Codeine Nausea And Vomiting    Chief Complaint  Patient presents with   Hospitalization Follow-up    HPI:  She is a 89 year old woman who has been hospitalized from 07-09-22 through 07-11-22. Her medical history includes: diabetes; hypertension; neuropathy; CVA. She presented to the ED after several days of increased weakness and falls.  She was found to have an UTI. Her white count was 18.1 she was started ceftriaxone. She will need three more days of cipro.  She had a mechanical fall resulting in contusions without fractures. She did lay on the floor overnight; then in bed for 2 more days before coming to the ED. She is here for short term rehab. She says that her goal is to return home; however more than likely this does represent a long term placement in either assisting living or skilled. She is not complaining of pain at this time. She will continue to be followed for her chronic illnesses including:    Controlled type 2 diabetes mellitus with neuropathy: Hypertension associated with type 2 diabetes mellitus: Diabetic polyneuropathy associated with type 2 diabetes mellitus:  Primary osteoarthritis of bilateral knees    Past Medical History:  Diagnosis Date   Diabetes mellitus without complication (HCC)    Diabetic neuropathy ([place])    Hypertension    Stroke ([place])    Urinary incontinence     Past Surgical History:  Procedure Laterality Date   ABDOMINAL HYSTERECTOMY     APPENDECTOMY     BREAST SURGERY     begnin tumor removed   CATARACT EXTRACTION, BILATERAL      Social History   Socioeconomic History   Marital status: Married    Spouse name: [name]   Number of children: 2   Years of education: Not on file   Highest education level: Not on file  Occupational History   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  Tobacco Use   Smoking status: Never   Smokeless tobacco: Never  Vaping Use   Vaping Use: Never used  Substance and Sexual Activity   Alcohol use: No   Drug use: No   Sexual activity: Not Currently    Birth control/protection: None  Other Topics Concern   Not on file  Social History Narrative   Not on file   Social Determinants of Health   Financial Resource Strain: Not on file  Food Insecurity: No Food Insecurity (02/08/2019)   Hunger Vital Sign    Worried About Running Out of Food in the Last Year: Never true    [place] in the Last Year: Never true  Transportation Needs: No Transportation Needs (02/08/2019)   PRAPARE - Hydrologist (Medical): No    Lack of Transportation (Non-Medical): No  Physical Activity: Inactive (02/08/2019)   Exercise Vital Sign    Days of Exercise per Week: 0 days    Minutes of Exercise per Session: 0 min  Stress: No Stress Concern Present (02/08/2019)   [place]    Feeling of Stress : Not at all  Social Connections: Unknown (02/08/2019)   Social Connection and Isolation Panel [NHANES]    Frequency of Communication with Friends and Family: More than three times a week    Frequency  of Social Gatherings with Friends and Family: More than three times a week    Attends Religious Services: More than 4 times per year    Active Member of [employer] or Organizations: Not on file    Attends Archivist Meetings: Not on file    Marital Status: Married  Intimate Partner Violence: Not At Risk (02/08/2019)   Humiliation, Afraid, Rape, and Kick questionnaire    Fear of Current or Ex-Partner: No    Emotionally Abused: No    Physically Abused: No    Sexually Abused: No   Family History  Problem Relation Age of Onset   CAD Mother    Hypertension Mother    Stroke Father    Heart attack Brother    Cancer Brother       VITAL SIGNS BP  128/64   Pulse 74   Temp 97.8 F (36.6 C)   Resp 20   Ht '5\' 9"'  (1.753 m)   Wt 186 lb 6.4 oz (84.6 kg)   SpO2 97%   BMI 27.53 kg/m   Outpatient Encounter Medications as of 07/12/2022  Medication Sig Note   amLODipine (NORVASC) 10 MG tablet Take 5 mg by mouth daily.    aspirin 325 MG tablet Take 1 tablet (325 mg total) by mouth daily.    Cholecalciferol (VITAMIN D3) 5000 units CAPS Take 1 capsule (5,000 Units total) by mouth daily.    ciprofloxacin (CIPRO) 500 MG tablet Take 1 tablet (500 mg total) by mouth 2 (two) times daily for 3 days.    furosemide (LASIX) 40 MG tablet Take 40 mg by mouth daily as needed for fluid or edema.    gabapentin (NEURONTIN) 300 MG capsule TAKE ONE CAPSULE (300MG TOTAL) BY MOUTH THREE TIMES DAILY    ibuprofen (ADVIL) 800 MG tablet Take 1 tablet (800 mg total) by mouth 3 (three) times daily.    Magnesium 200 MG TABS Take 1 tablet (200 mg total) by mouth daily.    metoprolol succinate (TOPROL-XL) 50 MG 24 hr tablet Take 50 mg by mouth at bedtime. Take with or immediately following a meal.    senna-docusate (SENOKOT-S) 8.6-50 MG tablet Take 1 tablet by mouth at bedtime.    simvastatin (ZOCOR) 20 MG tablet TAKE 1 TABLET BY MOUTH IN  THE EVENING    solifenacin (VESICARE) 5 MG tablet TAKE ONE TABLT (5 MG TOTAL) BY MOUTH DAILY.    zolpidem (AMBIEN) 10 MG tablet Take 10 mg by mouth at bedtime as needed for sleep.    blood glucose meter kit and supplies KIT Dispense based on patient and insurance preference. Use to TEST BLOOD GLUCOSE THREE times daily as directed. DX E11.65    insulin aspart (NOVOLOG) 100 UNIT/ML injection Inject 0-15 Units into the skin 3 (three) times daily with meals. (Patient not taking: Reported on 07/12/2022)    insulin aspart protamine - aspart (NOVOLOG MIX 70/30 FLEXPEN) (70-30) 100 UNIT/ML FlexPen Inject 0.6 mLs (60 Units total) into the skin 2 (two) times daily before a meal. (Patient not taking: Reported on 07/12/2022) 07/09/2022: Pt's daughter  unsure if pt has been taking her insulin or taking as directed   Insulin Pen Needle (PEN NEEDLES) 31G X 8 MM MISC Use as directed to inject insulin once daily    ondansetron (ZOFRAN) 4 MG tablet Take 1 tablet (4 mg total) by mouth every 6 (six) hours as needed for nausea. (Patient not taking: Reported on 07/12/2022)    OZEMPIC, 0.25 OR 0.5 MG/DOSE,  2 MG/3ML SOPN Inject 0.5 mg into the skin once a week. (Patient not taking: Reported on 07/12/2022) 07/09/2022: Pt unsure which day of the week she takes this med   No facility-administered encounter medications on file as of 07/12/2022.     SIGNIFICANT DIAGNOSTIC EXAMS  TODAY  07-09-22: chest x-ray:  The heart size and mediastinal contours are within normal limits. Both lungs are clear. The visualized skeletal structures are unremarkable.  LABS REVIEWED TODAY  07-09-22: wbc 18.1; hgb 15.6; hct 47.7; mcv 91.4 plt 303; glucose 53; bun 22; creat 0.86; k+ 4.0; na++ 139; ca 9.7; gfr >60; protein 7.5; albumin 3.3; total bili 1.6; hgb a1c 5.1; urine culture: klebsiella pneumoniae  07-11-22: wbc 10.5; hgb 12.4; hct 38.8; mcv 92.4;plt 285; glucose 122; bun 31; creat 0.74; k+ 3.6; na++ 138; ca 8.9; gfr>60 mag 2.3   Review of Systems  Constitutional:  Negative for malaise/fatigue.  Respiratory:  Negative for cough and shortness of breath.   Cardiovascular:  Negative for chest pain, palpitations and leg swelling.  Gastrointestinal:  Negative for abdominal pain, constipation and heartburn.  Musculoskeletal:  Negative for back pain, joint pain and myalgias.  Skin: Negative.   Neurological:  Negative for dizziness.  Psychiatric/Behavioral:  The patient is not nervous/anxious.    Physical Exam Constitutional:      General: She is not in acute distress.    Appearance: She is well-developed. She is not diaphoretic.  Neck:     Thyroid: No thyromegaly.  Cardiovascular:     Rate and Rhythm: Normal rate and regular rhythm.     Pulses: Normal pulses.     Heart  sounds: Normal heart sounds.  Pulmonary:     Effort: Pulmonary effort is normal. No respiratory distress.     Breath sounds: Normal breath sounds.  Abdominal:     General: Bowel sounds are normal. There is no distension.     Palpations: Abdomen is soft.     Tenderness: There is no abdominal tenderness.  Musculoskeletal:        General: Normal range of motion.     Cervical back: Neck supple.     Right lower leg: No edema.     Left lower leg: No edema.  Lymphadenopathy:     Cervical: No cervical adenopathy.  Skin:    General: Skin is warm and dry.     Comments: Bruising right eye   Neurological:     Mental Status: She is alert. Mental status is at baseline.  Psychiatric:        Mood and Affect: Mood normal.       ASSESSMENT/ PLAN:  TODAY  1 . Acute cystitis without hematuria: will complete cipro 500 mg twice daily for 3 more days   2. Controlled type 2 diabetes mellitus with neuropathy: hgb a1c 5.1; will stop insulin at this time; due to her low hgb a1c and advanced age; will restart medications as needed   3. Hypertension associated with type 2 diabetes mellitus: b/p 128/64 will continue norvasc 5 mg daily and toprol xl 50 mg daily asa 325 mg daily   4.  Acute respiratory failure unspecified whether with hypoxia or hypercapnia: will monitor  5. Diabetic polyneuropathy associated with type 2 diabetes mellitus: will continue gabapentin 300 mg three times daily   6. Primary osteoarthritis of bilateral knees: will stop advil at this time due to renal risks; will begin voltaren gel 2 gm three times daily to both knees   7. Hyperlipidemia associated with  type 2 diabetes mellitus: will continue zocor 20 mg daily   8. Late effect of cerebrovascular accident: will continue asa 325 mg daily   9. Chronic insomnia: will stop ambien due to recent falls.   10. Bilateral lower extremity edema: will continue lasix 40 mg daily as needed  11. Chronic constipation: will continue senna  s daily   12. Aortic atherosclerosis: (ct -15-23) is on asa and statin  13. Major neurocognitive  disorder / vascular dementia without behavioral disturbance: has moderate atrophy and white matter disease: weight is 190 pounds   14. Overactive bladder; will continue vesicare 5 mg daily        Ok Edwards NP [facility]   call [phone]

## 2022-07-13 ENCOUNTER — Non-Acute Institutional Stay (SKILLED_NURSING_FACILITY): Payer: Medicare Other | Admitting: Internal Medicine

## 2022-07-13 ENCOUNTER — Encounter: Payer: Self-pay | Admitting: Internal Medicine

## 2022-07-13 DIAGNOSIS — G9341 Metabolic encephalopathy: Secondary | ICD-10-CM

## 2022-07-13 DIAGNOSIS — M170 Bilateral primary osteoarthritis of knee: Secondary | ICD-10-CM

## 2022-07-13 DIAGNOSIS — I10 Essential (primary) hypertension: Secondary | ICD-10-CM

## 2022-07-13 DIAGNOSIS — J9600 Acute respiratory failure, unspecified whether with hypoxia or hypercapnia: Secondary | ICD-10-CM

## 2022-07-13 DIAGNOSIS — E1140 Type 2 diabetes mellitus with diabetic neuropathy, unspecified: Secondary | ICD-10-CM | POA: Diagnosis not present

## 2022-07-13 DIAGNOSIS — N3000 Acute cystitis without hematuria: Secondary | ICD-10-CM

## 2022-07-13 NOTE — Progress Notes (Signed)
NURSING HOME LOCATION:  [facility] ROOM NUMBER:  131 P  CODE STATUS:  Full Code  PCP: Sharilyn Sites MD  This is a comprehensive admission note to this SNFperformed on this date less than 30 days from date of admission. Included are preadmission medical/surgical history; reconciled medication list; family history; social history and comprehensive review of systems.  Corrections and additions to the records were documented. Comprehensive physical exam was also performed. Additionally a clinical summary was entered for each active diagnosis pertinent to this admission in the Problem List to enhance continuity of care.  HPI: Patient was hospitalized 7/17 - 07/11/2022 presenting with several day history of progressive weakness associated with a fall.  Actually the patient had been seen in the ED on 7/15 after mechanical fall from the bed.  She apparently did strike her left periorbital area sustaining ecchymosis.  Imaging revealed no associated fractures.  She was sent home and remained bedridden since the fall with progressive weakness. In the ED 7/17 pulse was up to 110 and blood pressure elevated up to 197/72; there was clinical concern for possible sepsis.  The uncontrolled hypertension was in the context of noncompliance with medications because of the acute and progressive illness. Urinalysis revealed moderate leukocytosis, many bacteria, and positive nitrites.  Urine culture revealed Klebsiella pneumonia resistant to ampicillin and intermediate sensitivity to nitrofurantoin.  She received ceftriaxone X 2 days with subsequent transition to p.o. ciprofloxacin for 3 additional days. Initial white count was 18,100; H/H was 15.6/47.7 Albumin was decreased at 3.3 but total protein was normal at 7.6. A1c was prediabetic at 5.1%.  While hospitalized glucoses ranged from a low of 53 on admission up to 170. Despite the history of fall and having been on the floor possibly overnight  and then in bed for 2 days; CK was not elevated. Labs 7/19 revealed slight prerenal azotemia with a BUN of 31, creatinine of 0.74 and CKD stage II with GFR greater than 60.  Final CBC revealed a normal white count of 10,500 with H/H of 12.4/38.8. She was discharged to the SNF for PT/OT.  Past medical and surgical history: Includes history of diabetes with neuropathy, history of stroke, essential hypertension, and urinary incontinence. Surgeries and procedures include abdominal hysterectomy and breast surgery with removal of a benign tumor.  Social history: Nondrinker, non-smoker.  Family history: Noncontributory due to advanced age.   Review of systems: Clinical neurocognitive deficits suspect as she denies having been in the ED on 7/15 related to the initial fall.  She adamantly states she was only in the hospital on 7/17 for a "UT something or other".  She denies any active GU symptoms.   When asked about ecchymosis around the left eye , she stated that she had hit the eye trying to get in a car.   At this time her major complaint is pain in the knees and inferiorly to the feet.  She describes this as sharp and constant.  She states it has been present since she was admitted.  She does have history of arthritis in the knees and gets cortisone shots from Dr. Aline Brochure every 3 months.  Constitutional: No fever, significant weight change, fatigue  Eyes: No redness, discharge, pain, vision change ENT/mouth: No nasal congestion, purulent discharge, earache, change in hearing, sore throat  Cardiovascular: No chest pain, palpitations, paroxysmal nocturnal dyspnea, claudication, edema  Respiratory: No cough, sputum production, hemoptysis, DOE, significant snoring, apnea Gastrointestinal: No heartburn, dysphagia, abdominal pain, nausea /vomiting, rectal  bleeding, melena, change in bowels Genitourinary: No dysuria, hematuria, pyuria, incontinence, nocturia Dermatologic: No rash, pruritus, change in  appearance of skin Neurologic: No dizziness, headache, syncope, seizures Psychiatric: No significant anxiety, depression, insomnia, anorexia Endocrine: No change in hair/skin/nails, excessive thirst, excessive hunger, excessive urination  Hematologic/lymphatic: No significant bruising, lymphadenopathy, abnormal bleeding Allergy/immunology: No itchy/watery eyes, significant sneezing, urticaria, angioedema  Physical exam:  Pertinent or positive findings: Hair is disheveled.  She is somewhat hard of hearing.  There is circular ecchymoses around the left eye.  Eyebrows are decreased laterally.  S1 is increased.  Breath sounds are decreased.  Abdomen is protuberant.  She has 1/2+ edema at the sock line.  Pedal pulses are decreased.  Fusiform change of the knees present with slight ballotability suggesting effusions.The right lower extremity is stronger to opposition than the left.  She has a large ,flat hyperpigmented black keratotic lesion at the right anterior inferior neck.  General appearance: Adequately nourished; no acute distress, increased work of breathing is present.   Lymphatic: No lymphadenopathy about the head, neck, axilla. Eyes: No conjunctival inflammation or lid edema is present. There is no scleral icterus. Ears:  External ear exam shows no significant lesions or deformities.   Nose:  External nasal examination shows no deformity or inflammation. Nasal mucosa are pink and moist without lesions, exudates Oral exam: Lips and gums are healthy appearing.There is no oropharyngeal erythema or exudate. Neck:  No thyromegaly, masses, tenderness noted.    Heart:  Normal rate and regular rhythm. S2 normal without gallop, murmur, click, rub.  Lungs:  without wheezes, rhonchi, rales, rubs. Abdomen: Bowel sounds are normal.  Abdomen is soft and nontender with no organomegaly, hernias, masses. GU: Deferred  Extremities:  No cyanosis, clubbing. Neurologic exam: Balance, Rhomberg, finger to nose  testing could not be completed due to clinical state Skin: Warm & dry w/o tenting. No significant rash.  See clinical summary under each active problem in the Problem List with associated updated therapeutic plan

## 2022-07-14 DIAGNOSIS — M179 Osteoarthritis of knee, unspecified: Secondary | ICD-10-CM | POA: Insufficient documentation

## 2022-07-14 NOTE — Assessment & Plan Note (Signed)
She denies any active GU symptoms at this time.

## 2022-07-14 NOTE — Patient Instructions (Signed)
See assessment and plan under each diagnosis in the problem list and acutely for this visit 

## 2022-07-14 NOTE — Assessment & Plan Note (Addendum)
Continue to monitor mental status and consider formal MMSE by PCP post SNF discharge.

## 2022-07-14 NOTE — Assessment & Plan Note (Addendum)
1/46/0479 systolic pressures up to 987 in the context of having been off medicines for 48 hours due to progressive weakness and acute UTI with AMS. Current systolic blood pressure minimally elevated; antihypertensives will be adjusted if there is persistent systolic blood pressure greater than 140.

## 2022-07-14 NOTE — Assessment & Plan Note (Signed)
7/17 - 07/11/2022 hospitalized with Klebsiella UTI.  Glucoses ranged from a low of 53 up to 170.  Current A1c is prediabetic at 5.1%.  No change indicated.

## 2022-07-14 NOTE — Assessment & Plan Note (Signed)
No cardiopulmonary symptoms at this time.

## 2022-07-14 NOTE — Assessment & Plan Note (Signed)
See 07/13/2022 symptomatic knee osteoarthritis with bilateral effusion suggested clinically. She had previously been on NSAIDs which are contraindicated at her age due to GI risk.  She receives intra-articular steroids from Dr. Aline Brochure periodically. Discuss topical diclofenac gel and short course of meloxicam with NP.

## 2022-07-16 ENCOUNTER — Non-Acute Institutional Stay (SKILLED_NURSING_FACILITY): Payer: Medicare Other | Admitting: Adult Health

## 2022-07-16 ENCOUNTER — Encounter: Payer: Self-pay | Admitting: Adult Health

## 2022-07-16 DIAGNOSIS — I700 Atherosclerosis of aorta: Secondary | ICD-10-CM | POA: Insufficient documentation

## 2022-07-16 DIAGNOSIS — E1140 Type 2 diabetes mellitus with diabetic neuropathy, unspecified: Secondary | ICD-10-CM | POA: Diagnosis not present

## 2022-07-16 DIAGNOSIS — F5104 Psychophysiologic insomnia: Secondary | ICD-10-CM | POA: Insufficient documentation

## 2022-07-16 DIAGNOSIS — F0390 Unspecified dementia without behavioral disturbance: Secondary | ICD-10-CM | POA: Insufficient documentation

## 2022-07-16 DIAGNOSIS — K5909 Other constipation: Secondary | ICD-10-CM | POA: Insufficient documentation

## 2022-07-16 DIAGNOSIS — R600 Localized edema: Secondary | ICD-10-CM | POA: Insufficient documentation

## 2022-07-16 DIAGNOSIS — E1169 Type 2 diabetes mellitus with other specified complication: Secondary | ICD-10-CM | POA: Insufficient documentation

## 2022-07-16 DIAGNOSIS — E1159 Type 2 diabetes mellitus with other circulatory complications: Secondary | ICD-10-CM | POA: Insufficient documentation

## 2022-07-16 DIAGNOSIS — F0150 Vascular dementia without behavioral disturbance: Secondary | ICD-10-CM | POA: Insufficient documentation

## 2022-07-16 NOTE — Progress Notes (Unsigned)
Location:  [place] Room Number: 131-P Place of Service:  SNF (31)   CODE STATUS: Full Code  Allergies  Allergen Reactions   Codeine Nausea And Vomiting    Chief Complaint  Patient presents with   Acute Visit    Diabetes    HPI:  Her cbg readings are elevated from upper 200's through 300's. She presently is not on medications for her diabetes. She will need to start on medications; will begin her on basaglar. There are no reports of excessive hunger or thirst   Past Medical History:  Diagnosis Date   Diabetes mellitus with neuropathy    Diabetic neuropathy (HCC)    Hypertension    Stroke [facility])    Urinary incontinence     Past Surgical History:  Procedure Laterality Date   ABDOMINAL HYSTERECTOMY     APPENDECTOMY     BREAST SURGERY     begnin tumor removed   CATARACT EXTRACTION, BILATERAL      Social History   Socioeconomic History   Marital status: Married    Spouse name: Erinn Mendosa   Number of children: 2   Years of education: Not on file   Highest education level: Not on file  Occupational History   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  Tobacco Use   Smoking status: Never   Smokeless tobacco: Never  Vaping Use   Vaping Use: Never used  Substance and Sexual Activity   Alcohol use: No   Drug use: No   Sexual activity: Not Currently    Birth control/protection: None  Other Topics Concern   Not on file  Social History Narrative   Not on file   Social Determinants of Health   Financial Resource Strain: Not on file  Food Insecurity: No Food Insecurity (02/08/2019)   Hunger Vital Sign    Worried About Running Out of Food in the Last Year: Never true    [place] in the Last Year: Never true  Transportation Needs: No Transportation Needs (02/08/2019)   PRAPARE - Hydrologist (Medical): No    Lack of Transportation (Non-Medical): No  Physical Activity: Inactive  (02/08/2019)   Exercise Vital Sign    Days of Exercise per Week: 0 days    Minutes of Exercise per Session: 0 min  Stress: No Stress Concern Present (02/08/2019)   [place]    Feeling of Stress : Not at all  Social Connections: Unknown (02/08/2019)   Social Connection and Isolation Panel [NHANES]    Frequency of Communication with Friends and Family: More than three times a week    Frequency of Social Gatherings with Friends and Family: More than three times a week    Attends Religious Services: More than 4 times per year    Active Member of [employer] or Organizations: Not on file    Attends Archivist Meetings: Not on file    Marital Status: Married  Intimate Partner Violence: Not At Risk (02/08/2019)   Humiliation, Afraid, Rape, and Kick questionnaire    Fear of Current or Ex-Partner: No    Emotionally Abused: No    Physically Abused: No    Sexually Abused: No   Family History  Problem Relation Age of Onset   CAD Mother    Hypertension Mother    Stroke Father    Heart attack Brother    Cancer Brother  VITAL SIGNS BP (!) 134/56   Pulse 80   Temp 97.7 F (36.5 C)   Resp 20   Ht '5\' 9"'  (1.753 m)   Wt 190 lb 3.2 oz (86.3 kg)   SpO2 98%   BMI 28.09 kg/m   Outpatient Encounter Medications as of 07/16/2022  Medication Sig Note   amLODipine (NORVASC) 10 MG tablet Take 5 mg by mouth daily.    aspirin 325 MG tablet Take 1 tablet (325 mg total) by mouth daily.    blood glucose meter kit and supplies KIT Dispense based on patient and insurance preference. Use to TEST BLOOD GLUCOSE THREE times daily as directed. DX E11.65    Cholecalciferol (VITAMIN D3) 5000 units CAPS Take 1 capsule (5,000 Units total) by mouth daily.    diclofenac Sodium (VOLTAREN ARTHRITIS PAIN) 1 % GEL Apply 2 g topically 3 (three) times daily. bilateral knees for osteoarthritis    furosemide (LASIX) 40 MG tablet Take 40 mg by mouth  daily as needed for fluid or edema.    gabapentin (NEURONTIN) 300 MG capsule TAKE ONE CAPSULE (300MG TOTAL) BY MOUTH THREE TIMES DAILY    Insulin Pen Needle (PEN NEEDLES) 31G X 8 MM MISC Use as directed to inject insulin once daily    Magnesium 200 MG TABS Take 1 tablet (200 mg total) by mouth daily.    metoprolol succinate (TOPROL-XL) 50 MG 24 hr tablet Take 50 mg by mouth at bedtime. Take with or immediately following a meal.    senna-docusate (SENOKOT-S) 8.6-50 MG tablet Take 1 tablet by mouth at bedtime.    simvastatin (ZOCOR) 20 MG tablet TAKE 1 TABLET BY MOUTH IN  THE EVENING    solifenacin (VESICARE) 5 MG tablet TAKE ONE TABLT (5 MG TOTAL) BY MOUTH DAILY.    ondansetron (ZOFRAN) 4 MG tablet Take 1 tablet (4 mg total) by mouth every 6 (six) hours as needed for nausea. (Patient not taking: Reported on 07/12/2022)    No facility-administered encounter medications on file as of 07/16/2022.     SIGNIFICANT DIAGNOSTIC EXAMS  PREVIOUS   07-09-22: chest x-ray:  The heart size and mediastinal contours are within normal limits. Both lungs are clear. The visualized skeletal structures are unremarkable.  NO NEW EXAMS   LABS REVIEWED PREVIOUS   07-09-22: wbc 18.1; hgb 15.6; hct 47.7; mcv 91.4 plt 303; glucose 53; bun 22; creat 0.86; k+ 4.0; na++ 139; ca 9.7; gfr >60; protein 7.5; albumin 3.3; total bili 1.6; hgb a1c 5.1; urine culture: klebsiella pneumoniae  07-11-22: wbc 10.5; hgb 12.4; hct 38.8; mcv 92.4;plt 285; glucose 122; bun 31; creat 0.74; k+ 3.6; na++ 138; ca 8.9; gfr>60 mag 2.3   NO NEW LABS.   Review of Systems  Constitutional:  Negative for malaise/fatigue.  Respiratory:  Negative for cough and shortness of breath.   Cardiovascular:  Negative for chest pain, palpitations and leg swelling.  Gastrointestinal:  Negative for abdominal pain, constipation and heartburn.  Musculoskeletal:  Negative for back pain, joint pain and myalgias.  Skin: Negative.   Neurological:  Negative for  dizziness.  Psychiatric/Behavioral:  The patient is not nervous/anxious.    Physical Exam Constitutional:      General: She is not in acute distress.    Appearance: She is well-developed. She is not diaphoretic.  Neck:     Thyroid: No thyromegaly.  Cardiovascular:     Rate and Rhythm: Normal rate and regular rhythm.     Pulses: Normal pulses.  Heart sounds: Normal heart sounds.  Pulmonary:     Effort: Pulmonary effort is normal. No respiratory distress.     Breath sounds: Normal breath sounds.  Abdominal:     General: Bowel sounds are normal. There is no distension.     Palpations: Abdomen is soft.     Tenderness: There is no abdominal tenderness.  Musculoskeletal:        General: Normal range of motion.     Cervical back: Neck supple.     Right lower leg: No edema.     Left lower leg: No edema.  Lymphadenopathy:     Cervical: No cervical adenopathy.  Skin:    General: Skin is warm and dry.     Comments: Bruising right eye    Neurological:     Mental Status: She is alert. Mental status is at baseline.  Psychiatric:        Mood and Affect: Mood normal.       ASSESSMENT/ PLAN:  TODAY  Controlled type 2 diabetes mellitus with neuropathy   Will begin basaglar 10 units nightly and will monitor her status.    Ok Edwards NP [facility]   call [phone]

## 2022-07-17 ENCOUNTER — Other Ambulatory Visit: Payer: Self-pay | Admitting: *Deleted

## 2022-07-17 NOTE — Patient Outreach (Signed)
Per [place] eligible member currently resides in [place].  Screening for potential care coordination/care management services as a benefit of Mrs. Dube's insurance plan and PCP.  Mrs. Hird PCP at [facility] has [facility] care management services available.   Mrs. Burlison admitted to SNF on 07/11/22 after hospitalization.  Update received from [place], [place] indicating Mrs. Ocampo is from home with spouse.   Will continue to follow and plan to send secure notification to [facility] upon SNF discharge.  [name], MSN, RN,BSN [place] Acute Care Coordinator [phone] [facility]) [phone]  (Toll free office)

## 2022-07-18 ENCOUNTER — Encounter: Payer: Self-pay | Admitting: Adult Health

## 2022-07-18 ENCOUNTER — Non-Acute Institutional Stay (SKILLED_NURSING_FACILITY): Payer: Medicare Other | Admitting: Adult Health

## 2022-07-18 DIAGNOSIS — E1140 Type 2 diabetes mellitus with diabetic neuropathy, unspecified: Secondary | ICD-10-CM | POA: Diagnosis not present

## 2022-07-18 DIAGNOSIS — E1165 Type 2 diabetes mellitus with hyperglycemia: Secondary | ICD-10-CM | POA: Diagnosis not present

## 2022-07-18 NOTE — Progress Notes (Unsigned)
Location:  [place] of Service:  SNF (31)   CODE STATUS: Full Code  Allergies  Allergen Reactions   Codeine Nausea And Vomiting    Chief Complaint  Patient presents with   Acute Visit    Elevated Blood Sugar Readings.     HPI:  She is presently taking lantus 10 units nightly. Her cbg readings remain very elevated in the 300-400 range. There are no reports of excessive hunger or thirst.she will need to have her medications further adjusted.   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  Tobacco Use   Smoking status: Never   Smokeless tobacco: Never  Vaping Use   Vaping Use: Never used  Substance and Sexual Activity   Alcohol use: No   Drug use: No   Sexual activity: Not Currently    Birth control/protection: None  Other Topics Concern   Not on file  Social History Narrative   Not on file   Social Determinants of Health   Financial Resource Strain: Not on file  Food Insecurity: No Food Insecurity (02/08/2019)   Hunger Vital Sign    Worried About Running Out of Food in the Last Year: Never true    [place] in the Last Year: Never true  Transportation Needs: No Transportation Needs (02/08/2019)   PRAPARE - Hydrologist (Medical): No    Lack of Transportation (Non-Medical): No  Physical Activity: Inactive (02/08/2019)   Exercise Vital Sign     Days of Exercise per Week: 0 days    Minutes of Exercise per Session: 0 min  Stress: No Stress Concern Present (02/08/2019)   [place]    Feeling of Stress : Not at all  Social Connections: Unknown (02/08/2019)   Social Connection and Isolation Panel [NHANES]    Frequency of Communication with Friends and Family: More than three times a week    Frequency of Social Gatherings with Friends and Family: More than three times a week    Attends Religious Services: More than 4 times per year    Active Member of [employer] or Organizations: Not on file    Attends Archivist Meetings: Not on file    Marital Status: Married  Intimate Partner Violence: Not At Risk (02/08/2019)   Humiliation, Afraid, Rape, and Kick questionnaire    Fear of Current or Ex-Partner: No    Emotionally Abused: No    Physically Abused: No    Sexually Abused: No   Family History  Problem Relation Age of Onset   CAD Mother    Hypertension Mother    Stroke Father    Heart attack Brother    Cancer Brother  VITAL SIGNS BP (!) 155/73   Pulse 70   Temp 97.8 F (36.6 C) (Skin)   Resp 20   Ht $R'5\' 9"'pb$  (1.753 m)   Wt 188 lb 9.6 oz (85.5 kg)   SpO2 96%   BMI 27.85 kg/m   Outpatient Encounter Medications as of 07/18/2022  Medication Sig   amLODipine (NORVASC) 10 MG tablet Take 5 mg by mouth daily.   aspirin 325 MG tablet Take 1 tablet (325 mg total) by mouth daily.   blood glucose meter kit and supplies KIT Dispense based on patient and insurance preference. Use to TEST BLOOD GLUCOSE THREE times daily as directed. DX E11.65   Cholecalciferol (VITAMIN D3) 5000 units CAPS Take 1 capsule (5,000 Units total) by mouth daily.   diclofenac Sodium (VOLTAREN ARTHRITIS PAIN) 1 % GEL Apply 2 g topically 3 (three) times daily. bilateral knees for osteoarthritis   furosemide (LASIX) 40 MG tablet Take 40 mg by mouth daily as needed for fluid or edema.    gabapentin (NEURONTIN) 300 MG capsule TAKE ONE CAPSULE ($RemoveBefo'300MG'qUyywegVuwN$  TOTAL) BY MOUTH THREE TIMES DAILY   insulin glargine (LANTUS SOLOSTAR) 100 UNIT/ML Solostar Pen Inject 30 Units into the skin at bedtime.   insulin lispro (HUMALOG KWIKPEN) 100 UNIT/ML KwikPen Inject 5 Units into the skin 3 (three) times daily.   Insulin Pen Needle (PEN NEEDLES) 31G X 8 MM MISC Use as directed to inject insulin once daily   Magnesium 200 MG TABS Take 1 tablet (200 mg total) by mouth daily.   metoprolol succinate (TOPROL-XL) 50 MG 24 hr tablet Take 50 mg by mouth at bedtime. Take with or immediately following a meal.   senna-docusate (SENOKOT-S) 8.6-50 MG tablet Take 1 tablet by mouth at bedtime.   simvastatin (ZOCOR) 20 MG tablet TAKE 1 TABLET BY MOUTH IN  THE EVENING   solifenacin (VESICARE) 5 MG tablet TAKE ONE TABLT (5 MG TOTAL) BY MOUTH DAILY.   ondansetron (ZOFRAN) 4 MG tablet Take 1 tablet (4 mg total) by mouth every 6 (six) hours as needed for nausea. (Patient not taking: Reported on 07/12/2022)   No facility-administered encounter medications on file as of 07/18/2022.     SIGNIFICANT DIAGNOSTIC EXAMS   PREVIOUS   07-09-22: chest x-ray:  The heart size and mediastinal contours are within normal limits. Both lungs are clear. The visualized skeletal structures are unremarkable.  NO NEW EXAMS   LABS REVIEWED PREVIOUS   07-09-22: wbc 18.1; hgb 15.6; hct 47.7; mcv 91.4 plt 303; glucose 53; bun 22; creat 0.86; k+ 4.0; na++ 139; ca 9.7; gfr >60; protein 7.5; albumin 3.3; total bili 1.6; hgb a1c 5.1; urine culture: klebsiella pneumoniae  07-11-22: wbc 10.5; hgb 12.4; hct 38.8; mcv 92.4;plt 285; glucose 122; bun 31; creat 0.74; k+ 3.6; na++ 138; ca 8.9; gfr>60 mag 2.3   NO NEW LABS  Review of Systems  Constitutional:  Negative for malaise/fatigue.  Respiratory:  Negative for cough and shortness of breath.   Cardiovascular:  Negative for chest pain, palpitations and leg swelling.  Gastrointestinal:  Negative  for abdominal pain, constipation and heartburn.  Musculoskeletal:  Negative for back pain, joint pain and myalgias.  Skin: Negative.   Neurological:  Negative for dizziness.  Psychiatric/Behavioral:  The patient is not nervous/anxious.    Physical Exam Constitutional:      General: She is not in acute distress.    Appearance: She is well-developed. She is not diaphoretic.  Neck:     Thyroid: No thyromegaly.  Cardiovascular:  Rate and Rhythm: Normal rate and regular rhythm.     Pulses: Normal pulses.     Heart sounds: Normal heart sounds.  Pulmonary:     Effort: Pulmonary effort is normal. No respiratory distress.     Breath sounds: Normal breath sounds.  Abdominal:     General: Bowel sounds are normal. There is no distension.     Palpations: Abdomen is soft.     Tenderness: There is no abdominal tenderness.  Musculoskeletal:        General: Normal range of motion.     Cervical back: Neck supple.     Right lower leg: No edema.     Left lower leg: No edema.  Lymphadenopathy:     Cervical: No cervical adenopathy.  Skin:    General: Skin is warm and dry.     Comments: Bruising around left eye   Neurological:     Mental Status: She is alert. Mental status is at baseline.  Psychiatric:        Mood and Affect: Mood normal.       ASSESSMENT/ PLAN:  TODAY  Poorly controlled type 2 diabetes mellitus with neuropathy: hgb a1c is 5.1 her cbg readings remain elevated: will increase lantus to 30 units nightly and will begin humalog 7 units with meals. Will monitor    Ok Edwards NP [facility]   call [phone]

## 2022-07-18 NOTE — Progress Notes (Signed)
This encounter was created in error - please disregard.

## 2022-07-19 ENCOUNTER — Other Ambulatory Visit (HOSPITAL_COMMUNITY)
Admission: RE | Admit: 2022-07-19 | Discharge: 2022-07-19 | Disposition: A | Discharge status: Home / Self Care | Payer: Medicare Other | Source: Skilled Nursing Facility | Attending: Adult Health | Admitting: Adult Health

## 2022-07-19 DIAGNOSIS — E1140 Type 2 diabetes mellitus with diabetic neuropathy, unspecified: Secondary | ICD-10-CM | POA: Insufficient documentation

## 2022-07-19 DIAGNOSIS — E1165 Type 2 diabetes mellitus with hyperglycemia: Secondary | ICD-10-CM | POA: Insufficient documentation

## 2022-07-19 LAB — COMPREHENSIVE METABOLIC PANEL
ALT: 30 U/L (ref 0–44)
AST: 22 U/L (ref 15–41)
Albumin: 2.6 g/dL — ABNORMAL LOW (ref 3.5–5.0)
Alkaline Phosphatase: 102 U/L (ref 38–126)
Anion gap: 6 (ref 5–15)
BUN: 18 mg/dL (ref 8–23)
CO2: 30 mmol/L (ref 22–32)
Calcium: 9.2 mg/dL (ref 8.9–10.3)
Chloride: 102 mmol/L (ref 98–111)
Creatinine, Ser: 0.9 mg/dL (ref 0.44–1.00)
GFR, Estimated: >60 mL/min (ref >60)
Glucose, Bld: 232 mg/dL — ABNORMAL HIGH (ref 70–99)
Potassium: 4.1 mmol/L (ref 3.5–5.1)
Sodium: 138 mmol/L (ref 135–145)
Total Bilirubin: 0.4 mg/dL (ref 0.3–1.2)
Total Protein: 5.3 g/dL — ABNORMAL LOW (ref 6.5–8.1)

## 2022-07-19 LAB — CBC
HCT: 36.3 % (ref 36.0–46.0)
Hemoglobin: 12.2 g/dL (ref 12.0–15.0)
MCH: 30 pg (ref 26.0–34.0)
MCHC: 33.6 g/dL (ref 30.0–36.0)
MCV: 89.4 fL (ref 80.0–100.0)
Platelets: 324 10*3/uL (ref 150–400)
RBC: 4.06 MIL/uL (ref 3.87–5.11)
RDW: 12.6 % (ref 11.5–15.5)
WBC: 8.4 10*3/uL (ref 4.0–10.5)
nRBC: 0 % (ref 0.0–0.2)

## 2022-07-25 ENCOUNTER — Other Ambulatory Visit: Payer: Self-pay | Admitting: Adult Health

## 2022-07-25 ENCOUNTER — Non-Acute Institutional Stay (SKILLED_NURSING_FACILITY): Payer: Medicare Other | Admitting: Adult Health

## 2022-07-25 ENCOUNTER — Encounter: Payer: Self-pay | Admitting: Adult Health

## 2022-07-25 DIAGNOSIS — E1165 Type 2 diabetes mellitus with hyperglycemia: Secondary | ICD-10-CM | POA: Diagnosis not present

## 2022-07-25 DIAGNOSIS — I152 Hypertension secondary to endocrine disorders: Secondary | ICD-10-CM | POA: Diagnosis not present

## 2022-07-25 DIAGNOSIS — I700 Atherosclerosis of aorta: Secondary | ICD-10-CM | POA: Diagnosis not present

## 2022-07-25 DIAGNOSIS — E1159 Type 2 diabetes mellitus with other circulatory complications: Secondary | ICD-10-CM

## 2022-07-25 DIAGNOSIS — E1140 Type 2 diabetes mellitus with diabetic neuropathy, unspecified: Secondary | ICD-10-CM

## 2022-07-25 DIAGNOSIS — E782 Mixed hyperlipidemia: Secondary | ICD-10-CM

## 2022-07-25 DIAGNOSIS — F0150 Vascular dementia without behavioral disturbance: Secondary | ICD-10-CM | POA: Diagnosis not present

## 2022-07-25 MED ORDER — SOLIFENACIN SUCCINATE 5 MG PO TABS: ORAL_TABLET | ORAL | 0 refills | Status: DC

## 2022-07-25 MED ORDER — AMLODIPINE BESYLATE 5 MG PO TABS: 5.0000 mg | ORAL_TABLET | Freq: Every day | ORAL | 0 refills | Status: DC

## 2022-07-25 MED ORDER — SIMVASTATIN 20 MG PO TABS: 20.0000 mg | ORAL_TABLET | Freq: Every evening | ORAL | 0 refills | Status: DC

## 2022-07-25 MED ORDER — INSULIN LISPRO (1 UNIT DIAL) 100 UNIT/ML (KWIKPEN): 5.0000 [IU] | PEN_INJECTOR | Freq: Three times a day (TID) | SUBCUTANEOUS | 0 refills | Status: DC

## 2022-07-25 MED ORDER — LANTUS SOLOSTAR 100 UNIT/ML ~~LOC~~ SOPN
30.0000 [IU] | PEN_INJECTOR | Freq: Every day | SUBCUTANEOUS | 0 refills | Status: DC
Start: 2022-07-25 — End: 2022-08-03

## 2022-07-25 MED ORDER — MAGNESIUM 200 MG PO TABS: 200.0000 mg | ORAL_TABLET | Freq: Every day | ORAL | 0 refills | Status: DC

## 2022-07-25 MED ORDER — GABAPENTIN 300 MG PO CAPS: ORAL_CAPSULE | ORAL | 0 refills | Status: DC

## 2022-07-25 MED ORDER — METOPROLOL SUCCINATE ER 50 MG PO TB24: 50.0000 mg | ORAL_TABLET | Freq: Every day | ORAL | 0 refills | Status: DC

## 2022-07-25 MED ORDER — FUROSEMIDE 40 MG PO TABS
40.0000 mg | ORAL_TABLET | Freq: Every day | ORAL | 0 refills | Status: DC | PRN
Start: 2022-07-25 — End: 2022-08-20

## 2022-07-25 MED ORDER — DICLOFENAC SODIUM 1 % EX GEL
2.0000 g | Freq: Three times a day (TID) | CUTANEOUS | 0 refills | Status: DC
Start: 2022-07-25 — End: 2023-07-05

## 2022-07-25 NOTE — Progress Notes (Signed)
Location:  [facility] Room Number: 644 Place of Service:  SNF [phone])  Provider: Ok Edwards np   PCP: Gerlene Fee, NP Patient Care Team: Gerlene Fee, NP as PCP - General (Geriatric Medicine) Tat, Eustace Quail, DO as Consulting Physician (Neurology)  Extended Emergency Contact Information Primary Emergency Contact: Renella Cunas Address: [address]          [place], [place] [address] Johnnette Litter of [place] Phone: [phone] Relation: Spouse Secondary Emergency Contact: Leachman,Phillip Mobile Phone: [phone] Relation: Son  Code Status: full  Goals of care:  Advanced Directive information    07/25/2022   12:26 PM  Advanced Directives  Does Patient Have a Medical Advance Directive? No     Allergies  Allergen Reactions   Codeine Nausea And Vomiting    Chief Complaint  Patient presents with   Discharge Note    Discharge    HPI:  88 y.o. female  being discharged to home with home health for pt/ot. She will need to follow up with her medical provider and will need her prescriptions written. She will need a 30 inch slide board. She had been hospitalized due to increased weakness and falls. Was found to have an uti. She was admitted to this facility for short term rehab. She has participated in pt and ot. She is now ready to discharge home.      Past Medical History:  Diagnosis Date   Diabetes mellitus with neuropathy    Diabetic neuropathy ([place])    Hypertension    Stroke [facility]    Urinary incontinence     Past Surgical History:  Procedure Laterality Date   ABDOMINAL HYSTERECTOMY     APPENDECTOMY     BREAST SURGERY     begnin tumor removed   CATARACT EXTRACTION, BILATERAL        reports that she has never smoked. She has never used smokeless tobacco. She reports that she does not drink alcohol and does not use drugs. 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  Tobacco Use   Smoking status: Never   Smokeless tobacco: Never  Vaping Use   Vaping Use: Never used  Substance and Sexual Activity   Alcohol use: No   Drug use: No   Sexual activity: Not Currently    Birth control/protection: None  Other Topics Concern   Not on file  Social History Narrative   Not on file   Social Determinants of Health   Financial Resource Strain: Not on file  Food Insecurity: No Food Insecurity (02/08/2019)   Hunger Vital Sign    Worried About Running Out of Food in the Last Year: Never true    [place] in the Last Year: Never true  Transportation Needs: No Transportation Needs (02/08/2019)   PRAPARE - Hydrologist (Medical): No    Lack of Transportation (Non-Medical): No  Physical Activity: Inactive (02/08/2019)   Exercise Vital Sign    Days of Exercise per Week: 0 days    Minutes of Exercise per Session: 0 min  Stress: No Stress Concern Present (02/08/2019)   [place]  Feeling of Stress : Not at all  Social Connections: Unknown (02/08/2019)   Social Connection and Isolation Panel [NHANES]    Frequency of Communication with Friends and Family: More than three times a week    Frequency of Social Gatherings with Friends and Family: More than three times a week    Attends Religious Services: More than 4 times per year    Active Member of [employer] or Organizations: Not on file    Attends Archivist Meetings: Not on file    Marital Status: Married  Intimate Partner Violence: Not At Risk (02/08/2019)   Humiliation, Afraid, Rape, and Kick questionnaire    Fear of Current or Ex-Partner: No    Emotionally Abused: No    Physically Abused: No    Sexually Abused: No    Functional Status Survey:    Allergies  Allergen Reactions   Codeine Nausea And Vomiting    Pertinent  Health Maintenance Due  Topic Date Due   FOOT EXAM  Never done   URINE MICROALBUMIN  Never done   DEXA SCAN  Never done   OPHTHALMOLOGY EXAM  12/02/2020   INFLUENZA VACCINE  07/24/2022   HEMOGLOBIN A1C  01/09/2023    Medications: Outpatient Encounter Medications as of 07/25/2022  Medication Sig   amLODipine (NORVASC) 5 MG tablet Take 1 tablet (5 mg total) by mouth daily.   aspirin 325 MG tablet Take 1 tablet (325 mg total) by mouth daily.   blood glucose meter kit and supplies KIT Dispense based on patient and insurance preference. Use to TEST BLOOD GLUCOSE THREE times daily as directed. DX E11.65   Cholecalciferol (VITAMIN D3) 5000 units CAPS Take 1 capsule (5,000 Units total) by mouth daily.   diclofenac Sodium (VOLTAREN ARTHRITIS PAIN) 1 % GEL Apply 2 g topically 3 (three) times daily. bilateral knees for osteoarthritis   furosemide (LASIX) 40 MG tablet Take 1 tablet (40 mg total) by mouth daily as needed for fluid or edema.   gabapentin (NEURONTIN) 300 MG capsule Take 300 mg three times daily   insulin glargine (LANTUS SOLOSTAR) 100 UNIT/ML Solostar Pen Inject 30 Units into the skin at bedtime.   insulin lispro (HUMALOG KWIKPEN) 100 UNIT/ML KwikPen Inject 5 Units into the skin 3 (three) times daily.   Insulin Pen Needle (PEN NEEDLES) 31G X 8 MM MISC Use as directed to inject insulin once daily   Magnesium 200 MG TABS Take 1 tablet (200 mg total) by mouth daily.   metoprolol succinate (TOPROL-XL) 50 MG 24 hr tablet Take 1 tablet (50 mg total) by mouth at bedtime. Take with or immediately following a meal.   senna-docusate (SENOKOT-S) 8.6-50 MG tablet Take 1 tablet by mouth at bedtime.   simvastatin (ZOCOR) 20 MG tablet Take 1 tablet (20 mg total) by mouth every evening.   solifenacin (VESICARE) 5 MG tablet TAKE ONE TABLT (5 MG TOTAL) BY MOUTH DAILY.   No  facility-administered encounter medications on file as of 07/25/2022.     Vitals:   07/25/22 1224  BP: 133/68  Pulse: 61  Resp: (!) 22  Temp: (!) 97.3 F (36.3 C)  SpO2: 99%  Weight: 188 lb 9.6 oz (85.5 kg)  Height: '5\' 9"'  (1.753 m)   Body mass index is 27.85 kg/m.   PREVIOUS   07-09-22: chest x-ray:  The heart size and mediastinal contours are within normal limits. Both lungs are clear. The visualized skeletal structures are unremarkable.  NO NEW EXAMS   LABS REVIEWED PREVIOUS  07-09-22: wbc 18.1; hgb 15.6; hct 47.7; mcv 91.4 plt 303; glucose 53; bun 22; creat 0.86; k+ 4.0; na++ 139; ca 9.7; gfr >60; protein 7.5; albumin 3.3; total bili 1.6; hgb a1c 5.1; urine culture: klebsiella pneumoniae  07-11-22: wbc 10.5; hgb 12.4; hct 38.8; mcv 92.4;plt 285; glucose 122; bun 31; creat 0.74; k+ 3.6; na++ 138; ca 8.9; gfr>60 mag 2.3   NO NEW LABS  Review of Systems  Constitutional:  Negative for malaise/fatigue.  Respiratory:  Negative for cough and shortness of breath.   Cardiovascular:  Negative for chest pain, palpitations and leg swelling.  Gastrointestinal:  Negative for abdominal pain, constipation and heartburn.  Musculoskeletal:  Negative for back pain, joint pain and myalgias.  Skin: Negative.   Neurological:  Negative for dizziness.  Psychiatric/Behavioral:  The patient is not nervous/anxious.    Physical Exam Constitutional:      General: She is not in acute distress.    Appearance: She is well-developed. She is not diaphoretic.  Neck:     Thyroid: No thyromegaly.  Cardiovascular:     Rate and Rhythm: Normal rate and regular rhythm.     Pulses: Normal pulses.     Heart sounds: Normal heart sounds.  Pulmonary:     Effort: Pulmonary effort is normal. No respiratory distress.     Breath sounds: Normal breath sounds.  Abdominal:     General: Bowel sounds are normal. There is no distension.     Palpations: Abdomen is soft.     Tenderness: There is no abdominal  tenderness.  Musculoskeletal:        General: Normal range of motion.     Cervical back: Neck supple.     Right lower leg: No edema.     Left lower leg: No edema.  Lymphadenopathy:     Cervical: No cervical adenopathy.  Skin:    General: Skin is warm and dry.     Comments:  Bruising around left eye    Neurological:     Mental Status: She is alert and oriented to person, place, and time.  Psychiatric:        Mood and Affect: Mood normal.      Assessment/Plan:     Patient is being discharged with the following home health services:  pt/ot to evaluate and treat as indicated for gait balance strength adl training.   Patient is being discharged with the following durable medical equipment:  30 inch slide board   Patient has been advised to f/u with their PCP in 1-2 weeks to for a transitions of care visit.  Social services at their facility was responsible for arranging this appointment.  Pt was provided with adequate prescriptions of noncontrolled medications to reach the scheduled appointment .  For controlled substances, a limited supply was provided as appropriate for the individual patient.  If the pt normally receives these medications from a pain clinic or has a contract with another physician, these medications should be received from that clinic or physician only).    A 30 day supply of her prescription medications have been sent to [facility]   Time spent with patient 35 minutes: medications; home health dme  Ok Edwards NP [facility]   call [phone]

## 2022-07-26 ENCOUNTER — Other Ambulatory Visit: Payer: Self-pay | Admitting: *Deleted

## 2022-07-26 NOTE — Patient Outreach (Addendum)
[place] Coordinator follow up. Mrs. [name] resides in [place]. Screening for care coordination/care management services as a benefit of [name]'s insurance plan and PCP.   Update received from [facility] SW indicating [name] will transition home on Friday, August 4th. [name] is adamant to return home. Lives with spouse. Private duty agency names provided to daughter.   [name] PCP with [name] has [facility] care management services.   Will send secure notification to [facility] upon SNF discharge.   Message sent to SNF SW to inquire about home health agency.   [name] Rolling, MSN, RN,BSN [place] Acute Care Coordinator [phone] [facility]) [phone]  (Toll free office)

## 2022-07-30 ENCOUNTER — Other Ambulatory Visit: Payer: Self-pay | Admitting: *Deleted

## 2022-07-30 DIAGNOSIS — M170 Bilateral primary osteoarthritis of knee: Secondary | ICD-10-CM | POA: Diagnosis not present

## 2022-07-30 DIAGNOSIS — Z794 Long term (current) use of insulin: Secondary | ICD-10-CM | POA: Diagnosis not present

## 2022-07-30 DIAGNOSIS — I152 Hypertension secondary to endocrine disorders: Secondary | ICD-10-CM | POA: Diagnosis not present

## 2022-07-30 DIAGNOSIS — K5909 Other constipation: Secondary | ICD-10-CM | POA: Diagnosis not present

## 2022-07-30 DIAGNOSIS — R531 Weakness: Secondary | ICD-10-CM | POA: Diagnosis not present

## 2022-07-30 DIAGNOSIS — E1159 Type 2 diabetes mellitus with other circulatory complications: Secondary | ICD-10-CM | POA: Diagnosis not present

## 2022-07-30 DIAGNOSIS — N3000 Acute cystitis without hematuria: Secondary | ICD-10-CM | POA: Diagnosis not present

## 2022-07-30 DIAGNOSIS — E876 Hypokalemia: Secondary | ICD-10-CM | POA: Diagnosis not present

## 2022-07-30 DIAGNOSIS — Z9181 History of falling: Secondary | ICD-10-CM | POA: Diagnosis not present

## 2022-07-30 DIAGNOSIS — E1169 Type 2 diabetes mellitus with other specified complication: Secondary | ICD-10-CM | POA: Diagnosis not present

## 2022-07-30 DIAGNOSIS — Z8673 Personal history of transient ischemic attack (TIA), and cerebral infarction without residual deficits: Secondary | ICD-10-CM | POA: Diagnosis not present

## 2022-07-30 DIAGNOSIS — E1142 Type 2 diabetes mellitus with diabetic polyneuropathy: Secondary | ICD-10-CM | POA: Diagnosis not present

## 2022-07-30 DIAGNOSIS — N3281 Overactive bladder: Secondary | ICD-10-CM | POA: Diagnosis not present

## 2022-07-30 DIAGNOSIS — Z7982 Long term (current) use of aspirin: Secondary | ICD-10-CM | POA: Diagnosis not present

## 2022-07-30 DIAGNOSIS — I700 Atherosclerosis of aorta: Secondary | ICD-10-CM | POA: Diagnosis not present

## 2022-07-30 DIAGNOSIS — E785 Hyperlipidemia, unspecified: Secondary | ICD-10-CM | POA: Diagnosis not present

## 2022-07-30 NOTE — Patient Outreach (Signed)
[place] Coordinator follow up.   Verified in [facility] [name] transitioned to home from [facility] on 07/27/22. She will have [facility] home health.   Telephone call made to [name]. She states she is waiting on home health to arrive. Explained [place] Management services. [name] states she will discuss with PCP. States she has MD appointment on 08/02/22. Endorses [name] will take her to appointment on 08/02/22.  [name] denies [facility] follow up at this time.    [name], MSN, RN,BSN [place] Acute Care Coordinator [phone] [facility]) [phone]  (Toll free office)

## 2022-08-01 ENCOUNTER — Other Ambulatory Visit: Payer: Self-pay

## 2022-08-01 ENCOUNTER — Observation Stay (HOSPITAL_COMMUNITY)
Admission: EM | Admit: 2022-08-01 | Discharge: 2022-08-03 | Disposition: A | Discharge status: Home Health | Payer: Medicare Other | Attending: Internal Medicine | Admitting: Internal Medicine

## 2022-08-01 ENCOUNTER — Encounter (HOSPITAL_COMMUNITY): Payer: Self-pay

## 2022-08-01 DIAGNOSIS — Z7982 Long term (current) use of aspirin: Secondary | ICD-10-CM | POA: Insufficient documentation

## 2022-08-01 DIAGNOSIS — E1165 Type 2 diabetes mellitus with hyperglycemia: Secondary | ICD-10-CM | POA: Insufficient documentation

## 2022-08-01 DIAGNOSIS — R2689 Other abnormalities of gait and mobility: Secondary | ICD-10-CM | POA: Insufficient documentation

## 2022-08-01 DIAGNOSIS — R5381 Other malaise: Secondary | ICD-10-CM | POA: Diagnosis not present

## 2022-08-01 DIAGNOSIS — F0390 Unspecified dementia without behavioral disturbance: Secondary | ICD-10-CM | POA: Diagnosis present

## 2022-08-01 DIAGNOSIS — M6281 Muscle weakness (generalized): Secondary | ICD-10-CM | POA: Insufficient documentation

## 2022-08-01 DIAGNOSIS — Z8673 Personal history of transient ischemic attack (TIA), and cerebral infarction without residual deficits: Secondary | ICD-10-CM | POA: Insufficient documentation

## 2022-08-01 DIAGNOSIS — I10 Essential (primary) hypertension: Secondary | ICD-10-CM | POA: Diagnosis not present

## 2022-08-01 DIAGNOSIS — E876 Hypokalemia: Secondary | ICD-10-CM | POA: Diagnosis not present

## 2022-08-01 DIAGNOSIS — Z794 Long term (current) use of insulin: Secondary | ICD-10-CM | POA: Insufficient documentation

## 2022-08-01 DIAGNOSIS — E11649 Type 2 diabetes mellitus with hypoglycemia without coma: Secondary | ICD-10-CM | POA: Diagnosis not present

## 2022-08-01 DIAGNOSIS — G9341 Metabolic encephalopathy: Secondary | ICD-10-CM | POA: Diagnosis not present

## 2022-08-01 DIAGNOSIS — Z79899 Other long term (current) drug therapy: Secondary | ICD-10-CM | POA: Diagnosis not present

## 2022-08-01 DIAGNOSIS — E162 Hypoglycemia, unspecified: Secondary | ICD-10-CM

## 2022-08-01 DIAGNOSIS — F0150 Vascular dementia without behavioral disturbance: Secondary | ICD-10-CM | POA: Diagnosis not present

## 2022-08-01 DIAGNOSIS — Z743 Need for continuous supervision: Secondary | ICD-10-CM | POA: Diagnosis not present

## 2022-08-01 DIAGNOSIS — R69 Illness, unspecified: Secondary | ICD-10-CM | POA: Diagnosis not present

## 2022-08-01 DIAGNOSIS — E785 Hyperlipidemia, unspecified: Secondary | ICD-10-CM | POA: Diagnosis present

## 2022-08-01 MED ORDER — DEXTROSE 50 % IV SOLN
50.0000 mL | INTRAVENOUS | Status: DC | PRN
  Administered 2022-08-01 – 2022-08-02 (×2): 50 mL via INTRAVENOUS
  Filled 2022-08-01 (×2): qty 50

## 2022-08-01 NOTE — ED Triage Notes (Signed)
Call for low blood sugar, was 31 on ems arrival and was given 1 mg of glucagon now is 63. Alert and oriented x 4 at present

## 2022-08-02 ENCOUNTER — Encounter: Payer: Self-pay | Admitting: Adult Health

## 2022-08-02 DIAGNOSIS — E11649 Type 2 diabetes mellitus with hypoglycemia without coma: Secondary | ICD-10-CM | POA: Diagnosis not present

## 2022-08-02 DIAGNOSIS — G9341 Metabolic encephalopathy: Secondary | ICD-10-CM

## 2022-08-02 DIAGNOSIS — I10 Essential (primary) hypertension: Secondary | ICD-10-CM

## 2022-08-02 DIAGNOSIS — E876 Hypokalemia: Secondary | ICD-10-CM

## 2022-08-02 LAB — CBC WITH DIFFERENTIAL/PLATELET
Abs Immature Granulocytes: 0.04 10*3/uL (ref 0.00–0.07)
Basophils Absolute: 0 10*3/uL (ref 0.0–0.1)
Basophils Relative: 0 %
Eosinophils Absolute: 0.2 10*3/uL (ref 0.0–0.5)
Eosinophils Relative: 2 %
HCT: 44.5 % (ref 36.0–46.0)
Hemoglobin: 14.3 g/dL (ref 12.0–15.0)
Immature Granulocytes: 0 %
Lymphocytes Relative: 15 %
Lymphs Abs: 1.4 10*3/uL (ref 0.7–4.0)
MCH: 29.4 pg (ref 26.0–34.0)
MCHC: 32.1 g/dL (ref 30.0–36.0)
MCV: 91.4 fL (ref 80.0–100.0)
Monocytes Absolute: 0.8 10*3/uL (ref 0.1–1.0)
Monocytes Relative: 9 %
Neutro Abs: 7 10*3/uL (ref 1.7–7.7)
Neutrophils Relative %: 74 %
Platelets: 277 10*3/uL (ref 150–400)
RBC: 4.87 MIL/uL (ref 3.87–5.11)
RDW: 13.2 % (ref 11.5–15.5)
WBC: 9.5 10*3/uL (ref 4.0–10.5)
nRBC: 0 % (ref 0.0–0.2)

## 2022-08-02 LAB — URINALYSIS, ROUTINE W REFLEX MICROSCOPIC
Bilirubin Urine: NEGATIVE
Glucose, UA: 50 mg/dL — AB
Hgb urine dipstick: NEGATIVE
Ketones, ur: NEGATIVE mg/dL
Leukocytes,Ua: NEGATIVE
Nitrite: NEGATIVE
Protein, ur: NEGATIVE mg/dL
Specific Gravity, Urine: 1.015 (ref 1.005–1.030)
pH: 5 (ref 5.0–8.0)

## 2022-08-02 LAB — COMPREHENSIVE METABOLIC PANEL
ALT: 20 U/L (ref 0–44)
AST: 24 U/L (ref 15–41)
Albumin: 3.6 g/dL (ref 3.5–5.0)
Alkaline Phosphatase: 105 U/L (ref 38–126)
Anion gap: 8 (ref 5–15)
BUN: 23 mg/dL (ref 8–23)
CO2: 26 mmol/L (ref 22–32)
Calcium: 9.9 mg/dL (ref 8.9–10.3)
Chloride: 108 mmol/L (ref 98–111)
Creatinine, Ser: 0.78 mg/dL (ref 0.44–1.00)
GFR, Estimated: >60 mL/min (ref >60)
Glucose, Bld: 60 mg/dL — ABNORMAL LOW (ref 70–99)
Potassium: 2.8 mmol/L — ABNORMAL LOW (ref 3.5–5.1)
Sodium: 142 mmol/L (ref 135–145)
Total Bilirubin: 0.4 mg/dL (ref 0.3–1.2)
Total Protein: 7.5 g/dL (ref 6.5–8.1)

## 2022-08-02 LAB — CBG MONITORING, ED
Glucose-Capillary: 109 mg/dL — ABNORMAL HIGH (ref 70–99)
Glucose-Capillary: 118 mg/dL — ABNORMAL HIGH (ref 70–99)
Glucose-Capillary: 156 mg/dL — ABNORMAL HIGH (ref 70–99)
Glucose-Capillary: 180 mg/dL — ABNORMAL HIGH (ref 70–99)
Glucose-Capillary: 24 mg/dL — CL (ref 70–99)
Glucose-Capillary: 47 mg/dL — ABNORMAL LOW (ref 70–99)

## 2022-08-02 LAB — GLUCOSE, CAPILLARY
Glucose-Capillary: 190 mg/dL — ABNORMAL HIGH (ref 70–99)
Glucose-Capillary: 195 mg/dL — ABNORMAL HIGH (ref 70–99)
Glucose-Capillary: 192 mg/dL — ABNORMAL HIGH (ref 70–99)
Glucose-Capillary: 156 mg/dL — ABNORMAL HIGH (ref 70–99)
Glucose-Capillary: 185 mg/dL — ABNORMAL HIGH (ref 70–99)
Glucose-Capillary: 192 mg/dL — ABNORMAL HIGH (ref 70–99)

## 2022-08-02 LAB — LIPASE, BLOOD: Lipase: 25 U/L (ref 11–51)

## 2022-08-02 LAB — MAGNESIUM: Magnesium: 1.9 mg/dL (ref 1.7–2.4)

## 2022-08-02 LAB — TROPONIN I (HIGH SENSITIVITY): Troponin I (High Sensitivity): 11 ng/L (ref <18)

## 2022-08-02 MED ORDER — DEXTROSE 10 % IV SOLN: INTRAVENOUS | Status: DC

## 2022-08-02 MED ORDER — INSULIN ASPART 100 UNIT/ML IJ SOLN: 0.0000 [IU] | Freq: Three times a day (TID) | INTRAMUSCULAR | Status: DC

## 2022-08-02 MED ORDER — DEXTROSE 10 % IV SOLN: Freq: Once | INTRAVENOUS | Status: DC

## 2022-08-02 MED ORDER — SODIUM CHLORIDE 0.9% FLUSH: 3.0000 mL | INTRAVENOUS | Status: DC | PRN

## 2022-08-02 MED ORDER — SODIUM CHLORIDE 0.9 % IV SOLN: 250.0000 mL | INTRAVENOUS | Status: DC | PRN

## 2022-08-02 MED ORDER — ENOXAPARIN SODIUM 40 MG/0.4ML IJ SOSY
40.0000 mg | PREFILLED_SYRINGE | INTRAMUSCULAR | Status: DC
  Administered 2022-08-02 – 2022-08-03 (×2): 40 mg via SUBCUTANEOUS
  Filled 2022-08-02 (×2): qty 0.4

## 2022-08-02 MED ORDER — METOPROLOL SUCCINATE ER 50 MG PO TB24
50.0000 mg | ORAL_TABLET | Freq: Every day | ORAL | Status: DC
  Administered 2022-08-02 – 2022-08-03 (×2): 50 mg via ORAL
  Filled 2022-08-02 (×2): qty 1

## 2022-08-02 MED ORDER — POTASSIUM CHLORIDE CRYS ER 10 MEQ PO TBCR
10.0000 meq | EXTENDED_RELEASE_TABLET | Freq: Every day | ORAL | Status: DC
Start: 2022-08-02 — End: 2022-08-03
  Administered 2022-08-02 – 2022-08-03 (×2): 10 meq via ORAL
  Filled 2022-08-02 (×2): qty 1

## 2022-08-02 MED ORDER — POTASSIUM CHLORIDE 10 MEQ/100ML IV SOLN
10.0000 meq | INTRAVENOUS | Status: AC
  Administered 2022-08-02 (×2): 10 meq via INTRAVENOUS
  Filled 2022-08-02 (×2): qty 100

## 2022-08-02 MED ORDER — INSULIN ASPART PROT & ASPART (70-30 MIX) 100 UNIT/ML ~~LOC~~ SUSP
5.0000 [IU] | Freq: Two times a day (BID) | SUBCUTANEOUS | Status: DC
  Filled 2022-08-02: qty 10

## 2022-08-02 MED ORDER — SODIUM CHLORIDE 0.9% FLUSH
3.0000 mL | Freq: Two times a day (BID) | INTRAVENOUS | Status: DC
  Administered 2022-08-02 – 2022-08-03 (×2): 3 mL via INTRAVENOUS

## 2022-08-02 MED ORDER — POTASSIUM CHLORIDE 10 MEQ/100ML IV SOLN
10.0000 meq | Freq: Once | INTRAVENOUS | Status: AC
  Administered 2022-08-02: 10 meq via INTRAVENOUS
  Filled 2022-08-02: qty 100

## 2022-08-02 MED ORDER — INSULIN GLARGINE-YFGN 100 UNIT/ML ~~LOC~~ SOLN
15.0000 [IU] | Freq: Every day | SUBCUTANEOUS | Status: DC
Start: 2022-08-02 — End: 2022-08-02
  Filled 2022-08-02: qty 0.15

## 2022-08-02 MED ORDER — POTASSIUM CHLORIDE CRYS ER 20 MEQ PO TBCR
80.0000 meq | EXTENDED_RELEASE_TABLET | Freq: Once | ORAL | Status: AC
  Administered 2022-08-02: 80 meq via ORAL
  Filled 2022-08-02: qty 4

## 2022-08-02 NOTE — Hospital Course (Addendum)
88 year old female with a history of dementia, hypertension, diabetes mellitus type 2, stroke, hyperlipidemia presenting with altered mental status.  The patient's family noted the patient to be confused.  They checked her CBG and it was running low.  EMS was activated.  Upon EMS arrival, the patient was noted to have a CBG in the 20s.  The patient was given glucagon and transported to ED where her sugars remained low.  The patient was ultimately started on a D10 drip and admitted for further evaluation and treatment. Notably, the patient was recent admitted to the hospital from 07/09/2022 to 07/11/2022 where she was treated for UTI.  She was discharged to [facility]) for short-term rehab.  At the time of discharge, the patient was instructed to stop metformin and glipizide.  Her 70/30 insulin, 60 units twice daily and Ozempic were continued.  After assessment at [facility], all her insulin was discontinued and the patient was monitored clinically.  Subsequently, the patient was noted to have CBGs in the 200s.  Semglee 10 units was initially started.  Her CBGs remained elevated.  On 07/16/2022, her Semglee was increased to 30 units at bedtime and Humalog 7 units with meals was added.  She was discharged home on this regimen.  However, upon her return home, the patient started taking her metformin and glipizide again. Upon further questioning, the patient has very poor insight regarding her insulin dosing.  Furthermore, her family also is unaware of the exact type of insulin she is taking.  The patient dispenses the insulin herself, but there have been concerns that she has not doing it consistently or properly.  In the ED, the patient was afebrile and hemodynamically stable with oxygen saturation 98% room air.  WBC 9.5, hemoglobin 14.3, platelets 277,000.  Sodium 142, potassium 2.8, bicarbonate 26, BUN 23, serum creatinine 0.78.  AST 24, ALT 20, alk phosphatase 105, total bilirubin 0.4.  The patient was  started on D10 drip and admitted for further evaluation and treatment.  As the patient continued to be monitored, her CBGs improved.  Her D10 drip was discontinued and her CBGs remained in the 150-190 range. In discussion with the patient's daughter, she states that the patient usually has her glucose when in the 200s.  However the patient had hemoglobin A1c of 5.1 on 07/09/2022.  This discordance suggest that the patient likely has had recurrent hypoglycemic episodes on numerous occasions. The patient has had a continuous glucose monitor in the past.  This has been recommended to the patient once again.  Given the patient's recurrent hypoglycemia, and fairly well-controlled CBGs off of insulin during the hospitalization, I am hesitant to restart the patient back on insulin at this time given her poor insight.  The patient was instructed to restart her metformin.  She was instructed to follow-up with her family physician to help arrange for a continuous glucose monitoring system.  I discussed with the patient's daughter that given the patient's age, it would be reasonable to opt for a strategy for allowing for more liberal glycemic control at this point.  She agrees with this strategy.

## 2022-08-02 NOTE — Assessment & Plan Note (Addendum)
Secondary to hypoglycemia Mental status improved as CBGs have improved Mental status at baseline at the time of discharge

## 2022-08-02 NOTE — Assessment & Plan Note (Signed)
Continue statin. 

## 2022-08-02 NOTE — Assessment & Plan Note (Signed)
Patient has had multiple changes in her medications since admission for urosepsis July 09, 2022: at time of discharge and again at time of discharge from SNF. Final regimen per note of Ms. Hervey Ard, NP for [employer] care is Lantus 15 u daily and humalog 5 u AC. Patient is a poor historian but does state she was taking metformin BID which had been stopped, and glipize in addition to at least one shot a day of unspecified insulin. She developed profound hypoglycemia to 30 with associated symptoms. In ED she was given to amps D50 and was started on D10 at 125 cc/hr with rise in [place] obs admit  When CBGs consistently running > 140 will DC D10 then -  Lantus 15 u QHS   Humalog 5 u AC  SS coverage/monitoring while in hospital

## 2022-08-02 NOTE — Assessment & Plan Note (Signed)
At risk for hospital delirium

## 2022-08-02 NOTE — Progress Notes (Signed)
This encounter was created in error - please disregard.

## 2022-08-02 NOTE — Assessment & Plan Note (Signed)
Patient is stable if not a good historian  Plan Continue home regimen

## 2022-08-02 NOTE — Assessment & Plan Note (Addendum)
Continue D10W>> ultimately discontinued Monitor CBGs every 2 hours>> CBGs remained 150-190 after D10W was discontinued Wean off D10 W as tolerated Hold insulin and sliding scale for now Patient will ultimately need close follow-up and home health to monitor--this was discussed with the patient's family Home health RN was set up. As discussed above, plan for more liberal glycemic control at this point Pt's diet was advanced and she tolerated well.  In fact, I found patient with box full of sugar cookies in room which pt tolerated

## 2022-08-02 NOTE — Inpatient Diabetes Management (Signed)
Inpatient Diabetes Program Recommendations  AACE/ADA: New Consensus Statement on Inpatient Glycemic Control (2015)  Target Ranges:  Prepandial:   less than 140 mg/dL      Peak postprandial:   less than 180 mg/dL (1-2 hours)      Critically ill patients:  140 - 180 mg/dL   Lab Results  Component Value Date   GLUCAP 156 (H) 08/02/2022   HGBA1C 5.1 07/09/2022    Review of Glycemic Control  Latest Reference Range & Units 08/01/22 23:52 08/02/22 00:19 08/02/22 01:24 08/02/22 02:31 08/02/22 03:57 08/02/22 07:54  Glucose-Capillary 70 - 99 mg/dL 24 (LL) 180 (H) 109 (H) 47 (L) 118 (H) 156 (H)  (LL): Data is critically low (H): Data is abnormally high (L): Data is abnormally low  Diabetes history: DM Outpatient Diabetes medications: Lantus 30 units QHS, Humalog 5 units TID, Glipizide 5 mg, Ozempic (not taking), 70/30 (not taking) Current orders for Inpatient glycemic control:  D10 '@75'$  ml/hr  Spoke with patient, daughter and spouse at bedside.  She states She takes Lantus and Humalog at home.  She administers her Lantus at night.  She has a recent admission in July and was discharged to [facility].  She can not tell me her insulin doses.  Daughter questions if she takes it regularly.    A1C was 5.1% in July indicating an average BG of 100 mg/dL.  Daughter states her Glucose is usually in the 200's in which case this A1C would explain multiple episodes of hypoglycemia.    She states she does not have frequent lows.  She has worn a CGM in the past and states it fell off after 2 days.  I do feel she would benefit from a CGM.  Explained importance of cleaning skin with soap and water and drying thoroughly.  She can set low alarms for 80 mg/dL.  This information can be shared with family using the Link App for [employer].    She normally has symptoms when she is low and treats with food.  She did not feel her significant low but family states she was acting strange.    Will follow  closely tomorrow and see what her needs will be for discharge.  If she requires insulin I would recommend discontinuing Glipizide at discharge.    Will continue to follow while inpatient.  Thank you, Reche Dixon, MSN, [place] Diabetes Coordinator Inpatient Diabetes Program [phone] (team pager from 8a-5p)

## 2022-08-02 NOTE — Progress Notes (Signed)
PROGRESS NOTE  [name] CXK:[id number] DOB: 09/16/1929 DOA: 08/01/2022 PCP: Gerlene Fee, NP  Brief History:  88 year old female with a history of dementia, hypertension, diabetes mellitus type 2, stroke, hyperlipidemia presenting with altered mental status.  The patient's family noted the patient to be confused.  They checked her CBG and it was running low.  EMS was activated.  Upon EMS arrival, the patient was noted to have a CBG in the 20s.  The patient was given glucagon and transported to ED where her sugars remained low.  The patient was ultimately started on a D10 drip and admitted for further evaluation and treatment. Notably, the patient was recent admitted to the hospital from 07/09/2022 to 07/11/2022 where she was treated for UTI.  She was discharged to [facility]) for short-term rehab.  At the time of discharge, the patient was instructed to stop metformin and glipizide.  Her 70/30 insulin, 60 units twice daily and Ozempic were continued.  After assessment at [facility], all her insulin was discontinued and the patient was monitored clinically.  Subsequently, the patient was noted to have CBGs in the 200s.  Semglee 10 units was initially started.  Her CBGs remained elevated.  On 07/16/2022, her Semglee was increased to 30 units at bedtime and Humalog 7 units with meals was added.  She was discharged home on this regimen.  However, upon her return home, the patient started taking her metformin and glipizide again. Upon further questioning, the patient has very poor insight regarding her insulin dosing.  Furthermore, her family also is unaware of the exact type of insulin she is taking.  The patient dispenses the insulin herself, but there have been concerns that she has not doing it consistently or properly.  In the ED, the patient was afebrile and hemodynamically stable with oxygen saturation 98% room air.  WBC 9.5, hemoglobin 14.3, platelets 277,000.  Sodium 142,  potassium 2.8, bicarbonate 26, BUN 23, serum creatinine 0.78.  AST 24, ALT 20, alk phosphatase 105, total bilirubin 0.4.  The patient was started on D10 drip and admitted for further evaluation and treatment.   Assessment/Plan:   Principal Problem:   Hypoglycemia associated with diabetes ([place]) Active Problems:   Essential hypertension   Hyperlipidemia   Acute metabolic encephalopathy   Major neurocognitive disorder (HCC)   Hypokalemia  Assessment and Plan: * Hypoglycemia associated with diabetes ([place]) Continue D10W Monitor CBGs every 2 hours Wean off D10 W as tolerated Hold insulin and sliding scale for now Patient will ultimately need close follow-up and home health to monitor--this was discussed with the patient's family  Essential hypertension Continue metoprolol succinate  Hold amlodipine and monitor   Hypokalemia Repleting Check mag  Major neurocognitive disorder ([place]) At risk for hospital delirium  Acute metabolic encephalopathy Secondary to hypoglycemia Mental status near baseline as CBGs have improved  Hyperlipidemia Continue statin       Family Communication:   daughter updated 26/10  Consultants:  none  Code Status:  FULL   DVT Prophylaxis:  [place] Lovenox   Procedures: As Listed in Progress Note Above  Antibiotics: None       Subjective: Patient denies fevers, chills, headache, chest pain, dyspnea, nausea, vomiting, diarrhea, abdominal pain, dysuria, hematuria, hematochezia, and melena.   Objective: Vitals:   08/02/22 0300 08/02/22 0330 08/02/22 0400 08/02/22 0500  BP: (!) 174/69 137/60 (!) 154/68 (!) 130/90  Pulse: 66 69 66 64  Resp:  '13 13 12 14  ' Temp:      TempSrc:      SpO2: 99% 96% 97% 99%  Weight:      Height:        Intake/Output Summary (Last 24 hours) at 08/02/2022 0720 Last data filed at 08/02/2022 0254 Gross per 24 hour  Intake 100 ml  Output --  Net 100 ml   Weight change:  Exam:  General:  Pt is alert,  follows commands appropriately, not in acute distress HEENT: No icterus, No thrush, No neck mass, [place]/AT Cardiovascular: RRR, S1/S2, no rubs, no gallops Respiratory: CTA bilaterally, no wheezing, no crackles, no rhonchi Abdomen: Soft/+BS, non tender, non distended, no guarding Extremities: No edema, No lymphangitis, No petechiae, No rashes, no synovitis   Data Reviewed: I have personally reviewed following labs and imaging studies Basic Metabolic Panel: Recent Labs  Lab 08/01/22 2351  NA 142  K 2.8*  CL 108  CO2 26  GLUCOSE 60*  BUN 23  CREATININE 0.78  CALCIUM 9.9  MG 1.9   Liver Function Tests: Recent Labs  Lab 08/01/22 2351  AST 24  ALT 20  ALKPHOS 105  BILITOT 0.4  PROT 7.5  ALBUMIN 3.6   Recent Labs  Lab 08/01/22 2351  LIPASE 25   No results for input(s): "AMMONIA" in the last 168 hours. Coagulation Profile: No results for input(s): "INR", "PROTIME" in the last 168 hours. CBC: Recent Labs  Lab 08/01/22 2351  WBC 9.5  NEUTROABS 7.0  HGB 14.3  HCT 44.5  MCV 91.4  PLT 277   Cardiac Enzymes: No results for input(s): "CKTOTAL", "CKMB", "CKMBINDEX", "TROPONINI" in the last 168 hours. BNP: Invalid input(s): "POCBNP" CBG: Recent Labs  Lab 08/01/22 2352 08/02/22 0019 08/02/22 0124 08/02/22 0231 08/02/22 0357  GLUCAP 24* 180* 109* 47* 118*   HbA1C: No results for input(s): "HGBA1C" in the last 72 hours. Urine analysis:    Component Value Date/Time   COLORURINE YELLOW 08/02/2022 0200   APPEARANCEUR CLEAR 08/02/2022 0200   APPEARANCEUR Clear 10/25/2021 1450   LABSPEC 1.015 08/02/2022 0200   PHURINE 5.0 08/02/2022 0200   GLUCOSEU 50 (A) 08/02/2022 0200   HGBUR NEGATIVE 08/02/2022 0200   BILIRUBINUR NEGATIVE 08/02/2022 0200   BILIRUBINUR Negative 10/25/2021 1450   KETONESUR NEGATIVE 08/02/2022 0200   PROTEINUR NEGATIVE 08/02/2022 0200   UROBILINOGEN 0.2 09/06/2009 0135   NITRITE NEGATIVE 08/02/2022 0200   LEUKOCYTESUR NEGATIVE 08/02/2022  0200   Sepsis Labs: '@LABRCNTIP' (procalcitonin:4,lacticidven:4) )No results found for this or any previous visit (from the past 240 hour(s)).   Scheduled Meds:  enoxaparin (LOVENOX) injection  40 mg Subcutaneous Q24H   potassium chloride  10 mEq Oral Daily   sodium chloride flush  3 mL Intravenous Q12H   Continuous Infusions:  sodium chloride     dextrose 125 mL/hr at 08/02/22 0255   potassium chloride      Procedures/Studies: DG Chest Port 1 View  Result Date: 07/09/2022 CLINICAL DATA:  Recent fall, pain, weakness EXAM: PORTABLE CHEST 1 VIEW COMPARISON:  02/29/2020 FINDINGS: The heart size and mediastinal contours are within normal limits. Both lungs are clear. The visualized skeletal structures are unremarkable. IMPRESSION: No active disease. Electronically Signed   By: Elmer Picker M.D.   On: 07/09/2022 13:36   DG Hip Unilat W or Wo Pelvis 2-3 Views Left  Result Date: 07/09/2022 CLINICAL DATA:  Pain EXAM: DG HIP (WITH OR WITHOUT PELVIS) 2-3V LEFT COMPARISON:  None Available. FINDINGS: No recent fracture or dislocation is  seen. There is surgical fusion at L4-L5 level in lumbar spine. Vascular calcifications are seen in the soft tissues. IMPRESSION: No fracture or dislocation is seen pelvis and left hip. Electronically Signed   By: Elmer Picker M.D.   On: 07/09/2022 13:34   CT Maxillofacial Wo Contrast  Result Date: 07/07/2022 CLINICAL DATA:  Head trauma. Fall getting into bed last night. Was unable to get herself up. Additional fall several days ago with black eye. EXAM: CT MAXILLOFACIAL WITHOUT CONTRAST TECHNIQUE: Multidetector CT imaging of the maxillofacial structures was performed. Multiplanar CT image reconstructions were also generated. RADIATION DOSE REDUCTION: This exam was performed according to the departmental dose-optimization program which includes automated exposure control, adjustment of the mA and/or kV according to patient size and/or use of iterative  reconstruction technique. COMPARISON:  CT head 02/29/2020 FINDINGS: Osseous: No fracture or mandibular dislocation. No destructive process. Advanced degenerative changes are present at the TMJ bilaterally. No residual maxillary teeth are present. Orbits: Bilateral lens replacements are noted. Globes and orbits are otherwise unremarkable. Sinuses: The paranasal sinuses and mastoid air cells are clear. Soft tissues: Soft tissues the face are unremarkable. No acute soft tissue injury. IMPRESSION: 1. No acute trauma to the face. 2. Advanced degenerative changes at the TMJ bilaterally. Electronically Signed   By: San Morelle M.D.   On: 07/07/2022 13:26   CT Cervical Spine Wo Contrast  Result Date: 07/07/2022 CLINICAL DATA:  Fall getting into bed last night. Patient was unable to get up after the fall. EXAM: CT CERVICAL SPINE WITHOUT CONTRAST TECHNIQUE: Multidetector CT imaging of the cervical spine was performed without intravenous contrast. Multiplanar CT image reconstructions were also generated. RADIATION DOSE REDUCTION: This exam was performed according to the departmental dose-optimization program which includes automated exposure control, adjustment of the mA and/or kV according to patient size and/or use of iterative reconstruction technique. COMPARISON:  MRI of the cervical spine 03/18/2015 FINDINGS: Alignment: No significant listhesis present. Straightening of the normal cervical lordosis is similar the prior exam. Skull base and vertebrae: Craniocervical junction is within normal limits. Prominent pannus again seen. No acute fractures are present. Soft tissues and spinal canal: No prevertebral fluid or swelling. No visible canal hematoma. Disc levels: Multilevel degenerative changes are present throughout the cervical spine. Foraminal narrowing is greatest on the left at C4-5 and on the right at C6-7 due to uncovertebral spurring. Upper chest: Lung apices are clear. Thoracic inlet is within normal  limits. Advanced degenerative changes are present at the right sternoclavicular joint. IMPRESSION: 1. No acute fracture or traumatic subluxation. 2. Multilevel degenerative changes as described. Electronically Signed   By: San Morelle M.D.   On: 07/07/2022 13:24   CT Head Wo Contrast  Result Date: 07/07/2022 CLINICAL DATA:  Head trauma. Fall getting into bed last night. Was unable to get herself up. Additional fall several days ago with black eye. EXAM: CT HEAD WITHOUT CONTRAST TECHNIQUE: Contiguous axial images were obtained from the base of the skull through the vertex without intravenous contrast. RADIATION DOSE REDUCTION: This exam was performed according to the departmental dose-optimization program which includes automated exposure control, adjustment of the mA and/or kV according to patient size and/or use of iterative reconstruction technique. COMPARISON:  MR head without contrast 03/01/2020. CT head without contrast 02/29/2020. FINDINGS: Brain: Moderate atrophy and white matter disease is stable. No acute infarct, hemorrhage, or mass lesion is present. The ventricles are proportionate to the degree of atrophy. No significant extraaxial fluid collection is present. The brainstem  and cerebellum are within normal limits. Vascular: Atherosclerotic calcifications are present within the cavernous internal carotid arteries. No hyperdense vessel is present. Skull: Scalp soft tissue swelling is present near the vertex, asymmetric to the left. No underlying fracture or foreign body is present. Sinuses/Orbits: The paranasal sinuses and mastoid air cells are clear. The globes and orbits are within normal limits. IMPRESSION: 1. Scalp soft tissue swelling near the vertex, asymmetric to the left. No underlying fracture or foreign body. 2. Stable atrophy and white matter disease. This likely reflects the sequela of chronic microvascular ischemia. 3. Atherosclerosis. Electronically Signed   By: San Morelle M.D.   On: 07/07/2022 13:21   DG Pelvis 1-2 Views  Result Date: 07/07/2022 CLINICAL DATA:  Fall last night EXAM: PELVIS - 1-2 VIEW COMPARISON:  None Available. FINDINGS: No pelvic fracture or diastasis. No hip dislocation on this single frontal view. Right hip is positioned in internal rotation. Right L4-5 spinal fusion hardware with no evidence of hardware fracture or loosening. Degenerative changes in the visualized lower lumbar spine. Mild bilateral hip osteoarthritis. No suspicious focal osseous lesions. Surgical clips in the right deep pelvis. IMPRESSION: No pelvic fracture. No hip dislocation on this single frontal view. Mild bilateral hip osteoarthritis. Electronically Signed   By: Ilona Sorrel M.D.   On: 07/07/2022 12:57   DG Lumbar Spine Complete  Result Date: 07/07/2022 CLINICAL DATA:  Fall last night EXAM: LUMBAR SPINE - COMPLETE 4+ VIEW COMPARISON:  06/13/2011 lumbar spine radiographs FINDINGS: This report assumes 5 non rib-bearing lumbar vertebrae. Status post right lumbar fusion at L4-5 with no evidence of hardware fracture or loosening. Bone cage in place in the L4-5 disc space. Lumbar vertebral body heights are preserved, with no fracture. Marked multilevel lumbar degenerative disc disease, most prominent at L2-3. Minimal 2 mm retrolisthesis at L1-2, L2-3 and L3-4. Mild bilateral lower lumbar facet arthropathy. No aggressive appearing focal osseous lesions. Abdominal aortic atherosclerosis. Surgical clips in the right deep pelvis. IMPRESSION: 1. No lumbar spine fracture or acute malalignment. 2. Status post right lumbar fusion at L4-5 without evidence of hardware complication. 3. Marked multilevel lumbar degenerative disc disease with minimal multilevel lumbar retrolisthesis as detailed. Electronically Signed   By: Ilona Sorrel M.D.   On: 07/07/2022 12:55    Orson Eva, DO  [facility]  If 7PM-7AM, please contact night-coverage [email or web address] Password [email or web address] 08/02/2022, 7:20  AM   LOS: 0 days

## 2022-08-02 NOTE — ED Provider Notes (Signed)
[facility] EMERGENCY DEPARTMENT Provider Note   CSN: [id number] Arrival date & time: 08/01/22  2340     History  Chief Complaint  Patient presents with   Hypoglycemia    [name] is a 88 y.o. female.  88 year old female the presents the ER today with hypoglycemia.  Apparently patient was altered today they checked her blood sugar and it was low.  They treated she had a bit better but then got low again so they called EMS.  On arrival her blood sugar was 20 and she was nonassertive lethargic but significant malaise.  She did recently have a hospital stay for couple days with a urinary tract infection and was discharged to [facility] for couple weeks for rehab and then went home a couple days ago.  Initially patient was unclear on her medications however after family arrived it is even more ambiguous.  I reviewed the records it appears that the pain center was unaware of her home medications and started on a new one.  It appears that on August 2 of this year she was prescribed 3 times a day insulin along with her long-acting insulin however patient states she takes a couple doses at night but her family doubts the accuracy of this statement.  They are going to go to the house to see.  No other recent illnesses   Hypoglycemia      Home Medications Prior to Admission medications   Medication Sig Start Date End Date Taking? Authorizing Provider  amLODipine (NORVASC) 5 MG tablet Take 1 tablet (5 mg total) by mouth daily. 07/25/22   Gerlene Fee, NP  aspirin 325 MG tablet Take 1 tablet (325 mg total) by mouth daily. 03/02/20   Barton Dubois, MD  blood glucose meter kit and supplies KIT Dispense based on patient and insurance preference. Use to TEST BLOOD GLUCOSE THREE times daily as directed. DX E11.65 02/10/21   Cassandria Anger, MD  Cholecalciferol (VITAMIN D3) 5000 units CAPS Take 1 capsule (5,000 Units total) by mouth daily. 04/15/17   Cassandria Anger, MD   diclofenac Sodium (VOLTAREN ARTHRITIS PAIN) 1 % GEL Apply 2 g topically 3 (three) times daily. bilateral knees for osteoarthritis 07/25/22   Gerlene Fee, NP  furosemide (LASIX) 40 MG tablet Take 1 tablet (40 mg total) by mouth daily as needed for fluid or edema. 07/25/22   Gerlene Fee, NP  gabapentin (NEURONTIN) 300 MG capsule Take 300 mg three times daily 07/25/22   Gerlene Fee, NP  insulin glargine (LANTUS SOLOSTAR) 100 UNIT/ML Solostar Pen Inject 30 Units into the skin at bedtime. 07/25/22   Gerlene Fee, NP  insulin lispro (HUMALOG KWIKPEN) 100 UNIT/ML KwikPen Inject 5 Units into the skin 3 (three) times daily. 07/25/22   Gerlene Fee, NP  Insulin Pen Needle (PEN NEEDLES) 31G X 8 MM MISC Use as directed to inject insulin once daily 08/15/20   Cassandria Anger, MD  Magnesium 200 MG TABS Take 1 tablet (200 mg total) by mouth daily. 07/25/22   Gerlene Fee, NP  metoprolol succinate (TOPROL-XL) 50 MG 24 hr tablet Take 1 tablet (50 mg total) by mouth at bedtime. Take with or immediately following a meal. 07/25/22   Nyoka Cowden, Phylis Bougie, NP  senna-docusate (SENOKOT-S) 8.6-50 MG tablet Take 1 tablet by mouth at bedtime. 07/11/22 08/10/22  Shahmehdi, Valeria Batman, MD  simvastatin (ZOCOR) 20 MG tablet Take 1 tablet (20 mg total) by mouth every evening. 07/25/22  Gerlene Fee, NP  solifenacin (VESICARE) 5 MG tablet TAKE ONE TABLT (5 MG TOTAL) BY MOUTH DAILY. 07/25/22   Gerlene Fee, NP      Allergies    Codeine    Review of Systems   Review of Systems  Physical Exam Updated Vital Signs BP (!) 154/68   Pulse 66   Temp 98.2 F (36.8 C) (Oral)   Resp 12   Ht _0  (1.626 m)   Wt 89.4 kg   SpO2 97%   BMI 33.81 kg/m  Physical Exam Vitals and nursing note reviewed.  HENT:     Mouth/Throat:     Mouth: Mucous membranes are moist.     Pharynx: Oropharynx is clear.  Eyes:     Pupils: Pupils are equal, round, and reactive to light.  Cardiovascular:     Rate and Rhythm: Normal  rate.  Abdominal:     General: Abdomen is flat.  Musculoskeletal:        General: No swelling or tenderness. Normal range of motion.  Skin:    General: Skin is warm and dry.  Neurological:     General: No focal deficit present.     Mental Status: Mental status is at baseline.     ED Results / Procedures / Treatments   Labs (all labs ordered are listed, but only abnormal results are displayed) Labs Reviewed  COMPREHENSIVE METABOLIC PANEL - Abnormal; Notable for the following components:      Result Value   Potassium 2.8 (*)    Glucose, Bld 60 (*)    All other components within normal limits  URINALYSIS, ROUTINE W REFLEX MICROSCOPIC - Abnormal; Notable for the following components:   Glucose, UA 50 (*)    All other components within normal limits  CBG MONITORING, ED - Abnormal; Notable for the following components:   Glucose-Capillary 24 (*)    All other components within normal limits  CBG MONITORING, ED - Abnormal; Notable for the following components:   Glucose-Capillary 180 (*)    All other components within normal limits  CBG MONITORING, ED - Abnormal; Notable for the following components:   Glucose-Capillary 109 (*)    All other components within normal limits  CBG MONITORING, ED - Abnormal; Notable for the following components:   Glucose-Capillary 47 (*)    All other components within normal limits  CBG MONITORING, ED - Abnormal; Notable for the following components:   Glucose-Capillary 118 (*)    All other components within normal limits  CBC WITH DIFFERENTIAL/PLATELET  LIPASE, BLOOD  MAGNESIUM  TROPONIN I (HIGH SENSITIVITY)    EKG None  Radiology No results found.  Procedures .Critical Care  Performed by: Merrily Pew, MD Authorized by: Merrily Pew, MD   Critical care provider statement:    Critical care time (minutes):  30   Critical care was necessary to treat or prevent imminent or life-threatening deterioration of the following conditions:   Metabolic crisis   Critical care was time spent personally by me on the following activities:  Development of treatment plan with patient or surrogate, discussions with consultants, evaluation of patient's response to treatment, examination of patient, ordering and review of laboratory studies, ordering and review of radiographic studies, ordering and performing treatments and interventions, pulse oximetry, re-evaluation of patient's condition and review of old charts     Medications Ordered in ED Medications  dextrose 50 % solution 50 mL (50 mLs Intravenous Given 08/02/22 0235)  dextrose 10 % infusion ( Intravenous  New Bag/Given 08/02/22 0255)  potassium chloride SA (KLOR-CON M) CR tablet 80 mEq (80 mEq Oral Given 08/02/22 0154)  potassium chloride 10 mEq in 100 mL IVPB (0 mEq Intravenous Stopped 08/02/22 0254)    ED Course/ Medical Decision Making/ A&P                           Medical Decision Making Amount and/or Complexity of Data Reviewed Labs: ordered. ECG/medicine tests: ordered.  Risk Prescription drug management. Decision regarding hospitalization.  Possible medication related. Will eval for cardiac or uti as causes.  Dauhter brought in her med list and insulin from refrigerator. She is on glipizide which increases her risk for persistent hypoglycemia but also has 4 different insulin pens in there from the last year, two are empty, four appear to be full. Patient doesn't know which ones she took. Will continue eval and adjust/give advice as needed.   Patient with recurrent hypoglycemia. I think she needs better medication management but with the glipizide on her list, will need observation for hypoglycemia. D/w Dr. Linda Hedges for admission.    Final Clinical Impression(s) / ED Diagnoses Final diagnoses:  Hypoglycemia    Rx / DC Orders ED Discharge Orders     None         Labrandon Knoch, Corene Cornea, MD 08/02/22 [phone]

## 2022-08-02 NOTE — Assessment & Plan Note (Addendum)
Repleted. °

## 2022-08-02 NOTE — TOC Progression Note (Signed)
  Transition of Care [facility]) Screening Note   Patient Details  Name: [name] Date of Birth: [birth year]   Transition of Care [facility]) CM/SW Contact:    Shade Flood, LCSW Phone Number: 08/02/2022, 9:37 AM    Transition of Care Department [facility]) has reviewed patient and no TOC needs have been identified at this time. We will continue to monitor patient advancement through interdisciplinary progression rounds. If new patient transition needs arise, please place a TOC consult.

## 2022-08-02 NOTE — Assessment & Plan Note (Addendum)
Continue metoprolol succinate  Hold amlodipine initially>>restart as BP improved and pt hypertensive again

## 2022-08-02 NOTE — H&P (Signed)
History and Physical    [name]:[id number] DOB: Oct 06, 1929 DOA: 08/01/2022  DOS: the patient was seen and examined on 08/01/2022  PCP: Gerlene Fee, NP   Patient coming from: Home  I have personally briefly reviewed patient's old medical records in [facility]  [name], a 88 y/o with dementia, carries dx DM on insulin, HLD, HTN, h/o CVA. She was hospitalized 07/09/22 for urosepsis from which she made a good recovery. She was d/c'd to SNF. He DM medications were changed and at the time of d/c 07/25/22 she was to be off glipizie and metformin. She was to contnue Lantus 15u daily and humalog 5 u before meals. At home she was noted to be somnolent, confused and not herself on the day of admission.EMS activated - found the patient to be profoundly hypoglycemic. EMS could not start IV. She was given two amp glucagon in transit to [facility].   ED Course: afebrile  154/68 BM! 33.8. Patient initially was somnolent. With administration of D50 and rise in CBG patient more alert per EDP report/ Lab - K 2.8, CBCD nl. Patient's CBG dipped again and she was started on D10. TRH called to admit patient for continued treatment and monitoring of life-threatening hypoglycemia  Review of Systems: Caveat - accuracy of ROS limited by patient's dementia Review of Systems  Constitutional: Negative.   HENT: Negative.    Eyes: Negative.   Respiratory: Negative.    Cardiovascular: Negative.   Genitourinary: Negative.   Musculoskeletal: Negative.   Skin: Negative.   Neurological: Negative.     Past Medical History:  Diagnosis Date   Diabetes mellitus with neuropathy    Diabetic neuropathy ([place])    Hypertension    Stroke [employer])    Urinary incontinence     Past Surgical History:  Procedure Laterality Date   ABDOMINAL HYSTERECTOMY     APPENDECTOMY     BREAST SURGERY     begnin tumor removed   CATARACT EXTRACTION, BILATERAL       reports that she has never smoked. She has never  used smokeless tobacco. She reports that she does not drink alcohol and does not use drugs.  Allergies  Allergen Reactions   Codeine Nausea And Vomiting    Family History  Problem Relation Age of Onset   CAD Mother    Hypertension Mother    Stroke Father    Heart attack Brother    Cancer Brother     Prior to Admission medications   Medication Sig Start Date End Date Taking? Authorizing Provider  amLODipine (NORVASC) 5 MG tablet Take 1 tablet (5 mg total) by mouth daily. 07/25/22   Gerlene Fee, NP  aspirin 325 MG tablet Take 1 tablet (325 mg total) by mouth daily. 03/02/20   Barton Dubois, MD  blood glucose meter kit and supplies KIT Dispense based on patient and insurance preference. Use to TEST BLOOD GLUCOSE THREE times daily as directed. DX E11.65 02/10/21   Cassandria Anger, MD  Cholecalciferol (VITAMIN D3) 5000 units CAPS Take 1 capsule (5,000 Units total) by mouth daily. 04/15/17   Cassandria Anger, MD  diclofenac Sodium (VOLTAREN ARTHRITIS PAIN) 1 % GEL Apply 2 g topically 3 (three) times daily. bilateral knees for osteoarthritis 07/25/22   Gerlene Fee, NP  furosemide (LASIX) 40 MG tablet Take 1 tablet (40 mg total) by mouth daily as needed for fluid or edema. 07/25/22   Gerlene Fee, NP  gabapentin (NEURONTIN) 300 MG  capsule Take 300 mg three times daily 07/25/22   Gerlene Fee, NP  insulin glargine (LANTUS SOLOSTAR) 100 UNIT/ML Solostar Pen Inject 30 Units into the skin at bedtime. 07/25/22   Gerlene Fee, NP  insulin lispro (HUMALOG KWIKPEN) 100 UNIT/ML KwikPen Inject 5 Units into the skin 3 (three) times daily. 07/25/22   Gerlene Fee, NP  Insulin Pen Needle (PEN NEEDLES) 31G X 8 MM MISC Use as directed to inject insulin once daily 08/15/20   Cassandria Anger, MD  Magnesium 200 MG TABS Take 1 tablet (200 mg total) by mouth daily. 07/25/22   Gerlene Fee, NP  metoprolol succinate (TOPROL-XL) 50 MG 24 hr tablet Take 1 tablet (50 mg total) by mouth at  bedtime. Take with or immediately following a meal. 07/25/22   Nyoka Cowden, Phylis Bougie, NP  senna-docusate (SENOKOT-S) 8.6-50 MG tablet Take 1 tablet by mouth at bedtime. 07/11/22 08/10/22  Shahmehdi, Valeria Batman, MD  simvastatin (ZOCOR) 20 MG tablet Take 1 tablet (20 mg total) by mouth every evening. 07/25/22   Gerlene Fee, NP  solifenacin (VESICARE) 5 MG tablet TAKE ONE TABLT (5 MG TOTAL) BY MOUTH DAILY. 07/25/22   Gerlene Fee, NP    Physical Exam: Vitals:   08/02/22 0300 08/02/22 0330 08/02/22 0400 08/02/22 0500  BP: (!) 174/69 137/60 (!) 154/68 (!) 130/90  Pulse: 66 69 66 64  Resp: '13 13 12 14  ' Temp:      TempSrc:      SpO2: 99% 96% 97% 99%  Weight:      Height:        Physical Exam Vitals and nursing note reviewed.  Constitutional:      Appearance: Normal appearance.     Comments: overweight  HENT:     Head: Normocephalic and atraumatic.     Nose: Nose normal.     Mouth/Throat:     Mouth: Mucous membranes are moist.  Eyes:     Extraocular Movements: Extraocular movements intact.     Conjunctiva/sclera: Conjunctivae normal.     Pupils: Pupils are equal, round, and reactive to light.  Cardiovascular:     Rate and Rhythm: Normal rate and regular rhythm.     Pulses: Normal pulses.     Heart sounds: Normal heart sounds. No murmur heard. Pulmonary:     Effort: Pulmonary effort is normal.     Breath sounds: Normal breath sounds.  Abdominal:     Palpations: Abdomen is soft. There is no mass.     Tenderness: There is no abdominal tenderness.  Musculoskeletal:        General: No swelling or tenderness.     Cervical back: Normal range of motion and neck supple.     Right lower leg: No edema.     Left lower leg: No edema.  Skin:    Comments: Multiple bruises  Neurological:     General: No focal deficit present.     Mental Status: She is oriented to person, place, and time.  Psychiatric:        Mood and Affect: Mood normal.        Behavior: Behavior normal.      Labs on  Admission: I have personally reviewed following labs and imaging studies  CBC: Recent Labs  Lab 08/01/22 2351  WBC 9.5  NEUTROABS 7.0  HGB 14.3  HCT 44.5  MCV 91.4  PLT 859   Basic Metabolic Panel: Recent Labs  Lab 08/01/22 2351  NA 142  K 2.8*  CL 108  CO2 26  GLUCOSE 60*  BUN 23  CREATININE 0.78  CALCIUM 9.9  MG 1.9   GFR: Estimated Creatinine Clearance: 50.6 mL/min (by C-G formula based on SCr of 0.78 mg/dL). Liver Function Tests: Recent Labs  Lab 08/01/22 2351  AST 24  ALT 20  ALKPHOS 105  BILITOT 0.4  PROT 7.5  ALBUMIN 3.6   Recent Labs  Lab 08/01/22 2351  LIPASE 25   No results for input(s): "AMMONIA" in the last 168 hours. Coagulation Profile: No results for input(s): "INR", "PROTIME" in the last 168 hours. Cardiac Enzymes: No results for input(s): "CKTOTAL", "CKMB", "CKMBINDEX", "TROPONINI" in the last 168 hours. BNP (last 3 results) No results for input(s): "PROBNP" in the last 8760 hours. HbA1C: No results for input(s): "HGBA1C" in the last 72 hours. CBG: Recent Labs  Lab 08/01/22 2352 08/02/22 0019 08/02/22 0124 08/02/22 0231 08/02/22 0357  GLUCAP 24* 180* 109* 47* 118*   Lipid Profile: No results for input(s): "CHOL", "HDL", "LDLCALC", "TRIG", "CHOLHDL", "LDLDIRECT" in the last 72 hours. Thyroid Function Tests: No results for input(s): "TSH", "T4TOTAL", "FREET4", "T3FREE", "THYROIDAB" in the last 72 hours. Anemia Panel: No results for input(s): "VITAMINB12", "FOLATE", "FERRITIN", "TIBC", "IRON", "RETICCTPCT" in the last 72 hours. Urine analysis:    Component Value Date/Time   COLORURINE YELLOW 08/02/2022 0200   APPEARANCEUR CLEAR 08/02/2022 0200   APPEARANCEUR Clear 10/25/2021 1450   LABSPEC 1.015 08/02/2022 0200   PHURINE 5.0 08/02/2022 0200   GLUCOSEU 50 (A) 08/02/2022 0200   HGBUR NEGATIVE 08/02/2022 0200   BILIRUBINUR NEGATIVE 08/02/2022 0200   BILIRUBINUR Negative 10/25/2021 1450   KETONESUR NEGATIVE 08/02/2022 0200    PROTEINUR NEGATIVE 08/02/2022 0200   UROBILINOGEN 0.2 09/06/2009 0135   NITRITE NEGATIVE 08/02/2022 0200   LEUKOCYTESUR NEGATIVE 08/02/2022 0200    Radiological Exams on Admission: I have personally reviewed images No results found.  EKG: I have personally reviewed EKG: no new EKG on chart  Assessment/Plan Principal Problem:   Hypoglycemia associated with type 2 diabetes mellitus ([place]) Active Problems:   Uncontrolled type 2 diabetes mellitus with hyperglycemia ([facility])   Vascular dementia without behavioral disturbance ([facility])   Essential hypertension   Hypokalemia    Assessment and Plan: * Hypoglycemia associated with type 2 diabetes mellitus ([place]) Patient presented with profound hypoglycemia 2/2 medication errors. She responded well to resuscitation with D50 and then D10  See plan for uncontrolled diabetes   Uncontrolled type 2 diabetes mellitus with hyperglycemia [facility]) Patient has had multiple changes in her medications since admission for urosepsis July 09, 2022: at time of discharge and again at time of discharge from SNF. Final regimen per note of Ms. Hervey Ard, NP for [employer] care is Lantus 15 u daily and humalog 5 u AC. Patient is a poor historian but does state she was taking metformin BID which had been stopped, and glipize in addition to at least one shot a day of unspecified insulin. She developed profound hypoglycemia to 30 with associated symptoms. In ED she was given to amps D50 and was started on D10 at 125 cc/hr with rise in [place] obs admit  When CBGs consistently running > 140 will DC D10 then -  Lantus 15 u QHS   Humalog 5 u AC  SS coverage/monitoring while in hospital    Vascular dementia without behavioral disturbance [facility]) Patient is stable if not a good historian  Plan Continue home regimen  Essential hypertension BP mildly elevated  at admission  Plan Continue home regimen.   Hypokalemia K 2.8 at admission  Plan 10 meq in 100 cc  NS  X 2  Then, potassium 10 meq daily       DVT prophylaxis: Lovenox Code Status: Full Code Family Communication: no family present  Disposition Plan: home when medically stable  Consults called: none  Admission status: obs, Med-Surg   Adella Hare, MD [facility] 08/02/2022, 6:03 AM

## 2022-08-02 NOTE — Subjective & Objective (Signed)
[name], a 88 y/o with dementia, carries dx DM on insulin, HLD, HTN, h/o CVA. She was hospitalized 07/09/22 for urosepsis from which she made a good recovery. She was d/c'd to SNF. He DM medications were changed and at the time of d/c 07/25/22 she was to be off glipizie and metformin. She was to contnue Lantus 15u daily and humalog 5 u before meals. At home she was noted to be somnolent, confused and not herself on the day of admission.EMS activated - found the patient to be profoundly hypoglycemic. EMS could not start IV. She was given two amp glucagon in transit to [facility].

## 2022-08-03 DIAGNOSIS — E876 Hypokalemia: Secondary | ICD-10-CM | POA: Diagnosis not present

## 2022-08-03 DIAGNOSIS — E11649 Type 2 diabetes mellitus with hypoglycemia without coma: Secondary | ICD-10-CM | POA: Diagnosis not present

## 2022-08-03 DIAGNOSIS — G9341 Metabolic encephalopathy: Secondary | ICD-10-CM | POA: Diagnosis not present

## 2022-08-03 DIAGNOSIS — I10 Essential (primary) hypertension: Secondary | ICD-10-CM | POA: Diagnosis not present

## 2022-08-03 DIAGNOSIS — F0390 Unspecified dementia without behavioral disturbance: Secondary | ICD-10-CM

## 2022-08-03 LAB — MAGNESIUM: Magnesium: 1.5 mg/dL — ABNORMAL LOW (ref 1.7–2.4)

## 2022-08-03 LAB — GLUCOSE, CAPILLARY
Glucose-Capillary: 150 mg/dL — ABNORMAL HIGH (ref 70–99)
Glucose-Capillary: 175 mg/dL — ABNORMAL HIGH (ref 70–99)

## 2022-08-03 LAB — BASIC METABOLIC PANEL
Anion gap: 5 (ref 5–15)
BUN: 12 mg/dL (ref 8–23)
CO2: 27 mmol/L (ref 22–32)
Calcium: 9.1 mg/dL (ref 8.9–10.3)
Chloride: 108 mmol/L (ref 98–111)
Creatinine, Ser: 0.71 mg/dL (ref 0.44–1.00)
GFR, Estimated: >60 mL/min (ref >60)
Glucose, Bld: 145 mg/dL — ABNORMAL HIGH (ref 70–99)
Potassium: 4.1 mmol/L (ref 3.5–5.1)
Sodium: 140 mmol/L (ref 135–145)

## 2022-08-03 MED ORDER — MAGNESIUM SULFATE 2 GM/50ML IV SOLN
2.0000 g | Freq: Once | INTRAVENOUS | Status: AC
  Administered 2022-08-03: 2 g via INTRAVENOUS
  Filled 2022-08-03: qty 50

## 2022-08-03 MED ORDER — METFORMIN HCL 500 MG PO TABS: 500.0000 mg | ORAL_TABLET | Freq: Two times a day (BID) | ORAL | Status: DC

## 2022-08-03 NOTE — Plan of Care (Signed)
  Problem: Acute Rehab PT Goals(only PT should resolve) Goal: Pt Will Go Supine/Side To Sit Outcome: Progressing Flowsheets (Taken 08/03/2022 1211) Pt will go Supine/Side to Sit:  with supervision  with modified independence Goal: Patient Will Transfer Sit To/From Stand Outcome: Progressing Flowsheets (Taken 08/03/2022 1211) Patient will transfer sit to/from stand: with moderate assist Goal: Pt Will Transfer Bed To Chair/Chair To Bed Outcome: Progressing Flowsheets (Taken 08/03/2022 1211) Pt will Transfer Bed to Chair/Chair to Bed: with mod assist Goal: Pt Will Perform Standing Balance Or Pre-Gait Outcome: Progressing Flowsheets (Taken 08/03/2022 1211) Pt will perform standing balance or pre-gait:  with moderate assist  3- 5 min   12:12 PM, 08/03/22 Lonell Grandchild, MPT Physical Therapist with [facility] [phone] office [phone] mobile phone

## 2022-08-03 NOTE — TOC Transition Note (Addendum)
Transition of Care [facility]) - CM/SW Discharge Note   Patient Details  Name: [name] MRN: [id number] Date of Birth: 01-14-1929  Transition of Care [facility]) CM/SW Contact:  Shade Flood, LCSW Phone Number: 08/03/2022, 11:31 AM   Clinical Narrative:     Pt stable for dc today per MD. [place] orders entered. Updated Linda at [employer]. There are no other TOC needs for dc.  46: Spoke with pt's husband by phone today after receiving update from PT that pt was requiring max assistance to get up from the bed. Offered SNF referral but husband stated that pt would not agree to go to rehab again and that they would manage at home. He states they have wheelchair and BSC. Offered to order [employer] and husband agreeable to [facility] placing order but he asked that the DME company call him to provide cost information prior to arranging for the lift. CMS provider options reviewed and referral made to [facility] at request of pt's husband. Thedore Mins stated he would contact Mr. Plant to review cost.   Offered EMS transport home though husband declined. Updated RN.   Final next level of care: [place] Barriers to Discharge: Barriers Resolved   Patient Goals and CMS Choice        Discharge Placement                       Discharge Plan and Services                                     Social Determinants of Health (SDOH) Interventions     Readmission Risk Interventions     No data to display

## 2022-08-03 NOTE — Discharge Summary (Signed)
Physician Discharge Summary   Patient: [name] MRN: [id number] DOB: 20-May-1929  Admit date:     08/01/2022  Discharge date: 08/03/22  Discharge Physician: Shanon Brow Denorris Reust   PCP: No primary care provider on file.   Recommendations at discharge:   Please follow up with primary care provider within 1-2 weeks  Please repeat BMP and CBC in one week    Hospital Course: 88 year old female with a history of dementia, hypertension, diabetes mellitus type 2, stroke, hyperlipidemia presenting with altered mental status.  The patient's family noted the patient to be confused.  They checked her CBG and it was running low.  EMS was activated.  Upon EMS arrival, the patient was noted to have a CBG in the 20s.  The patient was given glucagon and transported to ED where her sugars remained low.  The patient was ultimately started on a D10 drip and admitted for further evaluation and treatment. Notably, the patient was recent admitted to the hospital from 07/09/2022 to 07/11/2022 where she was treated for UTI.  She was discharged to [facility]) for short-term rehab.  At the time of discharge, the patient was instructed to stop metformin and glipizide.  Her 70/30 insulin, 60 units twice daily and Ozempic were continued.  After assessment at [facility], all her insulin was discontinued and the patient was monitored clinically.  Subsequently, the patient was noted to have CBGs in the 200s.  Semglee 10 units was initially started.  Her CBGs remained elevated.  On 07/16/2022, her Semglee was increased to 30 units at bedtime and Humalog 7 units with meals was added.  She was discharged home on this regimen.  However, upon her return home, the patient started taking her metformin and glipizide again. Upon further questioning, the patient has very poor insight regarding her insulin dosing.  Furthermore, her family also is unaware of the exact type of insulin she is taking.  The patient dispenses the insulin  herself, but there have been concerns that she has not doing it consistently or properly.  In the ED, the patient was afebrile and hemodynamically stable with oxygen saturation 98% room air.  WBC 9.5, hemoglobin 14.3, platelets 277,000.  Sodium 142, potassium 2.8, bicarbonate 26, BUN 23, serum creatinine 0.78.  AST 24, ALT 20, alk phosphatase 105, total bilirubin 0.4.  The patient was started on D10 drip and admitted for further evaluation and treatment.  As the patient continued to be monitored, her CBGs improved.  Her D10 drip was discontinued and her CBGs remained in the 150-190 range. In discussion with the patient's daughter, she states that the patient usually has her glucose when in the 200s.  However the patient had hemoglobin A1c of 5.1 on 07/09/2022.  This discordance suggest that the patient likely has had recurrent hypoglycemic episodes on numerous occasions. The patient has had a continuous glucose monitor in the past.  This has been recommended to the patient once again.  Given the patient's recurrent hypoglycemia, and fairly well-controlled CBGs off of insulin during the hospitalization, I am hesitant to restart the patient back on insulin at this time given her poor insight.  The patient was instructed to restart her metformin.  She was instructed to follow-up with her family physician to help arrange for a continuous glucose monitoring system.  I discussed with the patient's daughter that given the patient's age, it would be reasonable to opt for a strategy for allowing for more liberal glycemic control at this point.  She  agrees with this strategy.  Assessment and Plan: * Hypoglycemia associated with diabetes ([place]) Continue D10W>> ultimately discontinued Monitor CBGs every 2 hours>> CBGs remained 150-190 after D10W was discontinued Wean off D10 W as tolerated Hold insulin and sliding scale for now Patient will ultimately need close follow-up and home health to monitor--this was  discussed with the patient's family Home health RN was set up. As discussed above, plan for more liberal glycemic control at this point  Essential hypertension Continue metoprolol succinate  Hold amlodipine initially>>restart as BP improved and pt hypertensive again   Hypomagnesemia repleted  Hypokalemia Repleted  Major neurocognitive disorder ([place]) At risk for hospital delirium  Acute metabolic encephalopathy Secondary to hypoglycemia Mental status improved as CBGs have improved Mental status at baseline at the time of discharge  Hyperlipidemia Continue statin         Consultants: none Procedures performed: none  Disposition: Home Diet recommendation:  Carb modified diet DISCHARGE MEDICATION: Allergies as of 08/03/2022       Reactions   Codeine Nausea And Vomiting        Medication List     STOP taking these medications    insulin lispro 100 UNIT/ML KwikPen Commonly known as: HumaLOG KwikPen   Lantus SoloStar 100 UNIT/ML Solostar Pen Generic drug: insulin glargine       TAKE these medications    amLODipine 5 MG tablet Commonly known as: NORVASC Take 1 tablet (5 mg total) by mouth daily.   aspirin 325 MG tablet Take 1 tablet (325 mg total) by mouth daily.   blood glucose meter kit and supplies Kit Dispense based on patient and insurance preference. Use to TEST BLOOD GLUCOSE THREE times daily as directed. DX E11.65   diclofenac Sodium 1 % Gel Commonly known as: Voltaren Arthritis Pain Apply 2 g topically 3 (three) times daily. bilateral knees for osteoarthritis   furosemide 40 MG tablet Commonly known as: LASIX Take 1 tablet (40 mg total) by mouth daily as needed for fluid or edema.   gabapentin 300 MG capsule Commonly known as: NEURONTIN Take 300 mg three times daily   Magnesium 200 MG Tabs Take 1 tablet (200 mg total) by mouth daily.   metFORMIN 500 MG tablet Commonly known as: GLUCOPHAGE Take 1 tablet (500 mg total) by mouth 2  (two) times daily with a meal. Start taking on: August 04, 2022   metoprolol succinate 50 MG 24 hr tablet Commonly known as: TOPROL-XL Take 1 tablet (50 mg total) by mouth at bedtime. Take with or immediately following a meal.   Pen Needles 31G X 8 MM Misc Use as directed to inject insulin once daily   senna-docusate 8.6-50 MG tablet Commonly known as: Senokot-S Take 1 tablet by mouth at bedtime.   simvastatin 20 MG tablet Commonly known as: ZOCOR Take 1 tablet (20 mg total) by mouth every evening.   solifenacin 5 MG tablet Commonly known as: VESICARE TAKE ONE TABLT (5 MG TOTAL) BY MOUTH DAILY.   Trospium Chloride 60 MG Cp24 Take 1 capsule by mouth daily.   Vitamin D3 125 MCG (5000 UT) Caps Take 1 capsule (5,000 Units total) by mouth daily.               Durable Medical Equipment  (From admission, onward)           Start     Ordered   08/03/22 1157  For home use only DME Other see comment  Once  Comments: [facility]  Question:  Length of Need  Answer:  6 Months   08/03/22 1156            Follow-up Information     [facility] Follow [facility].   Specialty: [place] Why: [place] staff will call you to schedule their visits.               Discharge Exam: Filed Weights   08/01/22 2353  Weight: 89.4 kg   HEENT:  [place]/AT, No thrush, no icterus CV:  RRR, no rub, no S3, no S4 Lung:  CTA, no wheeze, no rhonchi Abd:  soft/+BS, NT Ext:  No edema, no lymphangitis, no synovitis, no rash   Condition at discharge: stable  The results of significant diagnostics from this hospitalization (including imaging, microbiology, ancillary and laboratory) are listed below for reference.   Imaging Studies: DG Chest Port 1 View  Result Date: 07/09/2022 CLINICAL DATA:  Recent fall, pain, weakness EXAM: PORTABLE CHEST 1 VIEW COMPARISON:  02/29/2020 FINDINGS: The heart size and mediastinal contours are within normal  limits. Both lungs are clear. The visualized skeletal structures are unremarkable. IMPRESSION: No active disease. Electronically Signed   By: Elmer Picker M.D.   On: 07/09/2022 13:36   DG Hip Unilat W or Wo Pelvis 2-3 Views Left  Result Date: 07/09/2022 CLINICAL DATA:  Pain EXAM: DG HIP (WITH OR WITHOUT PELVIS) 2-3V LEFT COMPARISON:  None Available. FINDINGS: No recent fracture or dislocation is seen. There is surgical fusion at L4-L5 level in lumbar spine. Vascular calcifications are seen in the soft tissues. IMPRESSION: No fracture or dislocation is seen pelvis and left hip. Electronically Signed   By: Elmer Picker M.D.   On: 07/09/2022 13:34   CT Maxillofacial Wo Contrast  Result Date: 07/07/2022 CLINICAL DATA:  Head trauma. Fall getting into bed last night. Was unable to get herself up. Additional fall several days ago with black eye. EXAM: CT MAXILLOFACIAL WITHOUT CONTRAST TECHNIQUE: Multidetector CT imaging of the maxillofacial structures was performed. Multiplanar CT image reconstructions were also generated. RADIATION DOSE REDUCTION: This exam was performed according to the departmental dose-optimization program which includes automated exposure control, adjustment of the mA and/or kV according to patient size and/or use of iterative reconstruction technique. COMPARISON:  CT head 02/29/2020 FINDINGS: Osseous: No fracture or mandibular dislocation. No destructive process. Advanced degenerative changes are present at the TMJ bilaterally. No residual maxillary teeth are present. Orbits: Bilateral lens replacements are noted. Globes and orbits are otherwise unremarkable. Sinuses: The paranasal sinuses and mastoid air cells are clear. Soft tissues: Soft tissues the face are unremarkable. No acute soft tissue injury. IMPRESSION: 1. No acute trauma to the face. 2. Advanced degenerative changes at the TMJ bilaterally. Electronically Signed   By: San Morelle M.D.   On: 07/07/2022 13:26    CT Cervical Spine Wo Contrast  Result Date: 07/07/2022 CLINICAL DATA:  Fall getting into bed last night. Patient was unable to get up after the fall. EXAM: CT CERVICAL SPINE WITHOUT CONTRAST TECHNIQUE: Multidetector CT imaging of the cervical spine was performed without intravenous contrast. Multiplanar CT image reconstructions were also generated. RADIATION DOSE REDUCTION: This exam was performed according to the departmental dose-optimization program which includes automated exposure control, adjustment of the mA and/or kV according to patient size and/or use of iterative reconstruction technique. COMPARISON:  MRI of the cervical spine 03/18/2015 FINDINGS: Alignment: No significant listhesis present. Straightening of the normal cervical lordosis is similar  the prior exam. Skull base and vertebrae: Craniocervical junction is within normal limits. Prominent pannus again seen. No acute fractures are present. Soft tissues and spinal canal: No prevertebral fluid or swelling. No visible canal hematoma. Disc levels: Multilevel degenerative changes are present throughout the cervical spine. Foraminal narrowing is greatest on the left at C4-5 and on the right at C6-7 due to uncovertebral spurring. Upper chest: Lung apices are clear. Thoracic inlet is within normal limits. Advanced degenerative changes are present at the right sternoclavicular joint. IMPRESSION: 1. No acute fracture or traumatic subluxation. 2. Multilevel degenerative changes as described. Electronically Signed   By: San Morelle M.D.   On: 07/07/2022 13:24   CT Head Wo Contrast  Result Date: 07/07/2022 CLINICAL DATA:  Head trauma. Fall getting into bed last night. Was unable to get herself up. Additional fall several days ago with black eye. EXAM: CT HEAD WITHOUT CONTRAST TECHNIQUE: Contiguous axial images were obtained from the base of the skull through the vertex without intravenous contrast. RADIATION DOSE REDUCTION: This exam was  performed according to the departmental dose-optimization program which includes automated exposure control, adjustment of the mA and/or kV according to patient size and/or use of iterative reconstruction technique. COMPARISON:  MR head without contrast 03/01/2020. CT head without contrast 02/29/2020. FINDINGS: Brain: Moderate atrophy and white matter disease is stable. No acute infarct, hemorrhage, or mass lesion is present. The ventricles are proportionate to the degree of atrophy. No significant extraaxial fluid collection is present. The brainstem and cerebellum are within normal limits. Vascular: Atherosclerotic calcifications are present within the cavernous internal carotid arteries. No hyperdense vessel is present. Skull: Scalp soft tissue swelling is present near the vertex, asymmetric to the left. No underlying fracture or foreign body is present. Sinuses/Orbits: The paranasal sinuses and mastoid air cells are clear. The globes and orbits are within normal limits. IMPRESSION: 1. Scalp soft tissue swelling near the vertex, asymmetric to the left. No underlying fracture or foreign body. 2. Stable atrophy and white matter disease. This likely reflects the sequela of chronic microvascular ischemia. 3. Atherosclerosis. Electronically Signed   By: San Morelle M.D.   On: 07/07/2022 13:21   DG Pelvis 1-2 Views  Result Date: 07/07/2022 CLINICAL DATA:  Fall last night EXAM: PELVIS - 1-2 VIEW COMPARISON:  None Available. FINDINGS: No pelvic fracture or diastasis. No hip dislocation on this single frontal view. Right hip is positioned in internal rotation. Right L4-5 spinal fusion hardware with no evidence of hardware fracture or loosening. Degenerative changes in the visualized lower lumbar spine. Mild bilateral hip osteoarthritis. No suspicious focal osseous lesions. Surgical clips in the right deep pelvis. IMPRESSION: No pelvic fracture. No hip dislocation on this single frontal view. Mild bilateral  hip osteoarthritis. Electronically Signed   By: Ilona Sorrel M.D.   On: 07/07/2022 12:57   DG Lumbar Spine Complete  Result Date: 07/07/2022 CLINICAL DATA:  Fall last night EXAM: LUMBAR SPINE - COMPLETE 4+ VIEW COMPARISON:  06/13/2011 lumbar spine radiographs FINDINGS: This report assumes 5 non rib-bearing lumbar vertebrae. Status post right lumbar fusion at L4-5 with no evidence of hardware fracture or loosening. Bone cage in place in the L4-5 disc space. Lumbar vertebral body heights are preserved, with no fracture. Marked multilevel lumbar degenerative disc disease, most prominent at L2-3. Minimal 2 mm retrolisthesis at L1-2, L2-3 and L3-4. Mild bilateral lower lumbar facet arthropathy. No aggressive appearing focal osseous lesions. Abdominal aortic atherosclerosis. Surgical clips in the right deep pelvis. IMPRESSION: 1. No  lumbar spine fracture or acute malalignment. 2. Status post right lumbar fusion at L4-5 without evidence of hardware complication. 3. Marked multilevel lumbar degenerative disc disease with minimal multilevel lumbar retrolisthesis as detailed. Electronically Signed   By: Ilona Sorrel M.D.   On: 07/07/2022 12:55    Microbiology: Results for orders placed or performed during the hospital encounter of 07/09/22  Urine Culture     Status: Abnormal   Collection Time: 07/09/22  3:24 PM   Specimen: Urine, Clean Catch  Result Value Ref Range Status   Specimen Description   Final    URINE, CLEAN CATCH Performed at [facility], [address] [place], [place] [address]    Special Requests   Final    NONE Performed at [facility], [address], [place], [place] [address]    Culture >=100,000 COLONIES/mL KLEBSIELLA PNEUMONIAE (A)  Final   Report Status 07/12/2022 FINAL  Final   Organism ID, Bacteria KLEBSIELLA PNEUMONIAE (A)  Final      Susceptibility   Klebsiella pneumoniae - MIC*    AMPICILLIN RESISTANT Resistant     CEFAZOLIN <=4 SENSITIVE Sensitive     CEFEPIME <=0.12  SENSITIVE Sensitive     CEFTRIAXONE <=0.25 SENSITIVE Sensitive     CIPROFLOXACIN <=0.25 SENSITIVE Sensitive     GENTAMICIN <=1 SENSITIVE Sensitive     IMIPENEM <=0.25 SENSITIVE Sensitive     NITROFURANTOIN 64 INTERMEDIATE Intermediate     TRIMETH/SULFA <=20 SENSITIVE Sensitive     AMPICILLIN/SULBACTAM <=2 SENSITIVE Sensitive     PIP/TAZO <=4 SENSITIVE Sensitive     * >=100,000 COLONIES/mL KLEBSIELLA PNEUMONIAE    Labs: CBC: Recent Labs  Lab 08/01/22 2351  WBC 9.5  NEUTROABS 7.0  HGB 14.3  HCT 44.5  MCV 91.4  PLT 511   Basic Metabolic Panel: Recent Labs  Lab 08/01/22 2351 08/03/22 0548  NA 142 140  K 2.8* 4.1  CL 108 108  CO2 26 27  GLUCOSE 60* 145*  BUN 23 12  CREATININE 0.78 0.71  CALCIUM 9.9 9.1  MG 1.9 1.5*   Liver Function Tests: Recent Labs  Lab 08/01/22 2351  AST 24  ALT 20  ALKPHOS 105  BILITOT 0.4  PROT 7.5  ALBUMIN 3.6   CBG: Recent Labs  Lab 08/02/22 1632 08/02/22 1829 08/02/22 2032 08/03/22 0737 08/03/22 1134  GLUCAP 156* 185* 192* 150* 175*    Discharge time spent: greater than 30 minutes.  Signed: Orson Eva, MD [facility] 08/03/2022

## 2022-08-03 NOTE — Inpatient Diabetes Management (Signed)
Inpatient Diabetes Program Recommendations  AACE/ADA: New Consensus Statement on Inpatient Glycemic Control (2015)  Target Ranges:  Prepandial:   less than 140 mg/dL      Peak postprandial:   less than 180 mg/dL (1-2 hours)      Critically ill patients:  140 - 180 mg/dL   Lab Results  Component Value Date   GLUCAP 175 (H) 08/03/2022   HGBA1C 5.1 07/09/2022    Review of Glycemic Control  Latest Reference Range & Units 08/03/22 07:37 08/03/22 11:34  Glucose-Capillary 70 - 99 mg/dL 150 (H) 175 (H)  (H): Data is abnormally high   Diabetes history: DM Outpatient Diabetes medications: Lantus 30 units QHS, Humalog 5 units TID, Glipizide 5 mg, Ozempic (not taking), 70/30 (not taking)  Inpatient Diabetes Program Recommendations:   Daughter states patient takes Glipizide 5 mg QD.  [place] and pharmacist confirms Glipizide was filled in February with a 30-day supply.    Please discontinue Glipizide as well as insulins at discharge.  Noted Metformin added for discharge.    Will continue to follow while inpatient.  Thank you, Reche Dixon, MSN, [place] Diabetes Coordinator Inpatient Diabetes Program [phone] (team pager from 8a-5p)

## 2022-08-03 NOTE — Evaluation (Signed)
Physical Therapy Evaluation [name] Details Name: [name] MRN: [id number] DOB: 1929-01-08 Today's Date: 08/03/2022  History of Present Illness  [name], a 88 y/o with dementia, carries dx DM on insulin, HLD, HTN, h/o CVA. She was hospitalized 07/09/22 for urosepsis from which she made a good recovery. She was d/c'd to SNF. [name] DM medications were changed and at the time of d/c 07/25/22 she was to be off glipizie and metformin. She was to contnue Lantus 15u daily and humalog 5 u before meals. At home she was noted to be somnolent, confused and not herself on the day of admission.EMS activated - found the [name] to be profoundly hypoglycemic. EMS could not start IV. She was given two amp glucagon in transit to [facility].   Clinical Impression  [name] demonstrates labored movement for sitting up at bedside, has difficulty laterally scooting due to weakness, limited to partially standing with Max assist when attempting to use RW and required Max assist stand pivot to transfer to chair after multiple attempts - nursing staff notified.  [name] will benefit from continued skilled physical therapy in hospital and recommended venue below to increase strength, balance, endurance for safe ADLs and gait.        Recommendations for follow up therapy are one component of a multi-disciplinary discharge planning process, led by the attending physician.  Recommendations may be updated based on [name] status, additional functional criteria and insurance authorization.  Follow Up Recommendations Skilled nursing-short term rehab (<3 hours/day) Can [name] physically be transported by private vehicle: No    Assistance Recommended at Discharge Set up Supervision/Assistance  [name] can return home with the following  A lot of help with bathing/dressing/bathroom;A lot of help with walking and/or transfers;Assistance with cooking/housework;Help with stairs or ramp for entrance    Equipment  Recommendations None recommended by PT  Recommendations for Other Services       Functional Status Assessment [name] has had a recent decline in their functional status and demonstrates the ability to make significant improvements in function in a reasonable and predictable amount of time.     Precautions / Restrictions Precautions Precautions: Fall Restrictions Weight Bearing Restrictions: No      Mobility  Bed Mobility Overal bed mobility: Needs Assistance Bed Mobility: Supine to Sit     Supine to sit: Min assist     General bed mobility comments: increased time, labored movement, had diffiuclty scooting laterally due to weakness    Transfers Overall transfer level: Needs assistance Equipment used: Rolling walker (2 wheels), 1 person hand held assist Transfers: Sit to/from Stand, Bed to chair/wheelchair/BSC Sit to Stand: Max assist Stand pivot transfers: Max assist, Min assist         General transfer comment: [name] limited to partial standing and unable to use RW to transfer, required stand pivot with knees blocked to transfer to chair    Ambulation/Gait                  Stairs            Wheelchair Mobility    Modified Rankin (Stroke Patients Only)       Balance Overall balance assessment: Needs assistance Sitting-balance support: Feet supported, No upper extremity supported Sitting balance-Leahy Scale: Fair Sitting balance - Comments: fair/good seated at EOB   Standing balance support: Reliant on assistive device for balance, Bilateral upper extremity supported, During functional activity Standing balance-Leahy Scale: Poor Standing balance comment: unable to maintain standing balance using RW  Pertinent Vitals/Pain Pain Assessment Pain Assessment: No/denies pain    Home Living Family/[name] expects to be discharged to:: Private residence Living Arrangements: Spouse/significant  other Available Help at Discharge: Family;Available 24 hours/day Type of Home: House Home Access: Ramped entrance     Alternate Level Stairs-Number of Steps: [name] does not go upstairs Home Layout: Two level Home Equipment: Rolling Walker (2 wheels);BSC/3in1;Shower seat;Grab bars - toilet;Grab bars - tub/shower;Wheelchair - manual      Prior Function Prior Level of Function : Needs assist       Physical Assist : ADLs (physical);Mobility (physical) Mobility (physical): Bed mobility;Transfers;Gait;Stairs   Mobility Comments: Uses wheel chair for mobility, 1 person assisted transfers, non-ambulatory ADLs Comments: assisted by family     Hand Dominance   Dominant Hand: Right    Extremity/Trunk Assessment   Upper Extremity Assessment Upper Extremity Assessment: Generalized weakness    Lower Extremity Assessment Lower Extremity Assessment: Generalized weakness    Cervical / Trunk Assessment Cervical / Trunk Assessment: Normal  Communication   Communication: HOH  Cognition Arousal/Alertness: Awake/alert Behavior During Therapy: WFL for tasks assessed/performed Overall Cognitive Status: Within Functional Limits for tasks assessed                                          General Comments      Exercises     Assessment/Plan    PT Assessment [name] needs continued PT services  PT Problem List Decreased strength;Decreased activity tolerance;Decreased balance;Decreased mobility       PT Treatment Interventions DME instruction;Gait training;Functional mobility training;Therapeutic activities;Therapeutic exercise;Balance training;[name]/family education    PT Goals (Current goals can be found in the Care Plan section)  Acute Rehab PT Goals [name] Stated Goal: return  home PT Goal Formulation: With [name] Time For Goal Achievement: 08/17/22 Potential to Achieve Goals: Fair    Frequency Min 3X/week     Co-evaluation                AM-PAC PT "6 Clicks" Mobility  Outcome Measure Help needed turning from your back to your side while in a flat bed without using bedrails?: None Help needed moving from lying on your back to sitting on the side of a flat bed without using bedrails?: A Little Help needed moving to and from a bed to a chair (including a wheelchair)?: A Lot Help needed standing up from a chair using your arms (e.g., wheelchair or bedside chair)?: A Lot Help needed to walk in hospital room?: Total Help needed climbing 3-5 steps with a railing? : Total 6 Click Score: 13    End of Session   Activity Tolerance: [name] tolerated treatment well;[name] limited by fatigue [name] left: in chair;with call bell/phone within reach Nurse Communication: Mobility status PT Visit Diagnosis: Unsteadiness on feet (R26.81);Other abnormalities of gait and mobility (R26.89);Muscle weakness (generalized) (M62.81)    Time: 1030-1101 PT Time Calculation (min) (ACUTE ONLY): 31 min   Charges:   PT Evaluation $PT Eval Moderate Complexity: 1 Mod PT Treatments $Therapeutic Activity: 23-37 mins        12:04 PM, 08/03/22 Lonell Grandchild, MPT Physical Therapist with [facility] [phone] office [phone] mobile phone

## 2022-08-03 NOTE — Care Management Obs Status (Signed)
[facility] NOTIFICATION   Patient Details  Name: [name] MRN: [id number] Date of Birth: 05/30/1929   Medicare Observation Status Notification Given:  Yes    Tommy Medal 08/03/2022, 10:05 AM

## 2022-08-03 NOTE — Assessment & Plan Note (Signed)
repleted ?

## 2022-08-06 DIAGNOSIS — I700 Atherosclerosis of aorta: Secondary | ICD-10-CM | POA: Diagnosis not present

## 2022-08-06 DIAGNOSIS — N3000 Acute cystitis without hematuria: Secondary | ICD-10-CM | POA: Diagnosis not present

## 2022-08-06 DIAGNOSIS — K5909 Other constipation: Secondary | ICD-10-CM | POA: Diagnosis not present

## 2022-08-06 DIAGNOSIS — N3281 Overactive bladder: Secondary | ICD-10-CM | POA: Diagnosis not present

## 2022-08-06 DIAGNOSIS — M170 Bilateral primary osteoarthritis of knee: Secondary | ICD-10-CM | POA: Diagnosis not present

## 2022-08-06 DIAGNOSIS — I152 Hypertension secondary to endocrine disorders: Secondary | ICD-10-CM | POA: Diagnosis not present

## 2022-08-06 DIAGNOSIS — R531 Weakness: Secondary | ICD-10-CM | POA: Diagnosis not present

## 2022-08-06 DIAGNOSIS — E876 Hypokalemia: Secondary | ICD-10-CM | POA: Diagnosis not present

## 2022-08-06 DIAGNOSIS — Z9181 History of falling: Secondary | ICD-10-CM | POA: Diagnosis not present

## 2022-08-06 DIAGNOSIS — E1169 Type 2 diabetes mellitus with other specified complication: Secondary | ICD-10-CM | POA: Diagnosis not present

## 2022-08-06 DIAGNOSIS — Z794 Long term (current) use of insulin: Secondary | ICD-10-CM | POA: Diagnosis not present

## 2022-08-06 DIAGNOSIS — Z8673 Personal history of transient ischemic attack (TIA), and cerebral infarction without residual deficits: Secondary | ICD-10-CM | POA: Diagnosis not present

## 2022-08-06 DIAGNOSIS — E785 Hyperlipidemia, unspecified: Secondary | ICD-10-CM | POA: Diagnosis not present

## 2022-08-06 DIAGNOSIS — Z7982 Long term (current) use of aspirin: Secondary | ICD-10-CM | POA: Diagnosis not present

## 2022-08-06 DIAGNOSIS — E1142 Type 2 diabetes mellitus with diabetic polyneuropathy: Secondary | ICD-10-CM | POA: Diagnosis not present

## 2022-08-06 DIAGNOSIS — E1159 Type 2 diabetes mellitus with other circulatory complications: Secondary | ICD-10-CM | POA: Diagnosis not present

## 2022-08-07 DIAGNOSIS — R531 Weakness: Secondary | ICD-10-CM | POA: Diagnosis not present

## 2022-08-07 DIAGNOSIS — R262 Difficulty in walking, not elsewhere classified: Secondary | ICD-10-CM | POA: Diagnosis not present

## 2022-08-07 DIAGNOSIS — I639 Cerebral infarction, unspecified: Secondary | ICD-10-CM | POA: Diagnosis not present

## 2022-08-07 DIAGNOSIS — E1022 Type 1 diabetes mellitus with diabetic chronic kidney disease: Secondary | ICD-10-CM | POA: Diagnosis not present

## 2022-08-07 DIAGNOSIS — Z0001 Encounter for general adult medical examination with abnormal findings: Secondary | ICD-10-CM | POA: Diagnosis not present

## 2022-08-08 DIAGNOSIS — R531 Weakness: Secondary | ICD-10-CM | POA: Diagnosis not present

## 2022-08-08 DIAGNOSIS — Z9181 History of falling: Secondary | ICD-10-CM | POA: Diagnosis not present

## 2022-08-08 DIAGNOSIS — I152 Hypertension secondary to endocrine disorders: Secondary | ICD-10-CM | POA: Diagnosis not present

## 2022-08-08 DIAGNOSIS — N3281 Overactive bladder: Secondary | ICD-10-CM | POA: Diagnosis not present

## 2022-08-08 DIAGNOSIS — E1169 Type 2 diabetes mellitus with other specified complication: Secondary | ICD-10-CM | POA: Diagnosis not present

## 2022-08-08 DIAGNOSIS — Z7982 Long term (current) use of aspirin: Secondary | ICD-10-CM | POA: Diagnosis not present

## 2022-08-08 DIAGNOSIS — K5909 Other constipation: Secondary | ICD-10-CM | POA: Diagnosis not present

## 2022-08-08 DIAGNOSIS — I700 Atherosclerosis of aorta: Secondary | ICD-10-CM | POA: Diagnosis not present

## 2022-08-08 DIAGNOSIS — E1159 Type 2 diabetes mellitus with other circulatory complications: Secondary | ICD-10-CM | POA: Diagnosis not present

## 2022-08-08 DIAGNOSIS — E876 Hypokalemia: Secondary | ICD-10-CM | POA: Diagnosis not present

## 2022-08-08 DIAGNOSIS — E785 Hyperlipidemia, unspecified: Secondary | ICD-10-CM | POA: Diagnosis not present

## 2022-08-08 DIAGNOSIS — Z794 Long term (current) use of insulin: Secondary | ICD-10-CM | POA: Diagnosis not present

## 2022-08-08 DIAGNOSIS — M170 Bilateral primary osteoarthritis of knee: Secondary | ICD-10-CM | POA: Diagnosis not present

## 2022-08-08 DIAGNOSIS — Z8673 Personal history of transient ischemic attack (TIA), and cerebral infarction without residual deficits: Secondary | ICD-10-CM | POA: Diagnosis not present

## 2022-08-08 DIAGNOSIS — E1142 Type 2 diabetes mellitus with diabetic polyneuropathy: Secondary | ICD-10-CM | POA: Diagnosis not present

## 2022-08-08 DIAGNOSIS — N3000 Acute cystitis without hematuria: Secondary | ICD-10-CM | POA: Diagnosis not present

## 2022-08-13 ENCOUNTER — Other Ambulatory Visit: Payer: Self-pay

## 2022-08-13 ENCOUNTER — Encounter (HOSPITAL_COMMUNITY): Payer: Self-pay

## 2022-08-13 ENCOUNTER — Emergency Department (HOSPITAL_COMMUNITY): Payer: Medicare Other

## 2022-08-13 ENCOUNTER — Inpatient Hospital Stay (HOSPITAL_COMMUNITY)
Admission: EM | Admit: 2022-08-13 | Discharge: 2022-08-20 | DRG: 602 | Disposition: A | Discharge status: Skilled Nursing Facility | Payer: Medicare Other | Attending: Internal Medicine | Admitting: Internal Medicine

## 2022-08-13 DIAGNOSIS — K5909 Other constipation: Secondary | ICD-10-CM | POA: Diagnosis not present

## 2022-08-13 DIAGNOSIS — E1143 Type 2 diabetes mellitus with diabetic autonomic (poly)neuropathy: Secondary | ICD-10-CM | POA: Diagnosis not present

## 2022-08-13 DIAGNOSIS — E1142 Type 2 diabetes mellitus with diabetic polyneuropathy: Secondary | ICD-10-CM | POA: Diagnosis not present

## 2022-08-13 DIAGNOSIS — Z9842 Cataract extraction status, left eye: Secondary | ICD-10-CM

## 2022-08-13 DIAGNOSIS — I639 Cerebral infarction, unspecified: Secondary | ICD-10-CM | POA: Diagnosis not present

## 2022-08-13 DIAGNOSIS — I152 Hypertension secondary to endocrine disorders: Secondary | ICD-10-CM | POA: Diagnosis not present

## 2022-08-13 DIAGNOSIS — M170 Bilateral primary osteoarthritis of knee: Secondary | ICD-10-CM | POA: Diagnosis not present

## 2022-08-13 DIAGNOSIS — E1140 Type 2 diabetes mellitus with diabetic neuropathy, unspecified: Secondary | ICD-10-CM | POA: Diagnosis present

## 2022-08-13 DIAGNOSIS — Z66 Do not resuscitate: Secondary | ICD-10-CM | POA: Diagnosis present

## 2022-08-13 DIAGNOSIS — R531 Weakness: Secondary | ICD-10-CM | POA: Diagnosis not present

## 2022-08-13 DIAGNOSIS — Z90710 Acquired absence of both cervix and uterus: Secondary | ICD-10-CM

## 2022-08-13 DIAGNOSIS — R5381 Other malaise: Secondary | ICD-10-CM | POA: Diagnosis not present

## 2022-08-13 DIAGNOSIS — E785 Hyperlipidemia, unspecified: Secondary | ICD-10-CM | POA: Diagnosis present

## 2022-08-13 DIAGNOSIS — Z7982 Long term (current) use of aspirin: Secondary | ICD-10-CM | POA: Diagnosis not present

## 2022-08-13 DIAGNOSIS — R739 Hyperglycemia, unspecified: Secondary | ICD-10-CM | POA: Diagnosis not present

## 2022-08-13 DIAGNOSIS — F0390 Unspecified dementia without behavioral disturbance: Secondary | ICD-10-CM | POA: Diagnosis present

## 2022-08-13 DIAGNOSIS — Z8673 Personal history of transient ischemic attack (TIA), and cerebral infarction without residual deficits: Secondary | ICD-10-CM | POA: Diagnosis not present

## 2022-08-13 DIAGNOSIS — Z79899 Other long term (current) drug therapy: Secondary | ICD-10-CM | POA: Diagnosis not present

## 2022-08-13 DIAGNOSIS — R509 Fever, unspecified: Secondary | ICD-10-CM | POA: Diagnosis not present

## 2022-08-13 DIAGNOSIS — Z743 Need for continuous supervision: Secondary | ICD-10-CM | POA: Diagnosis not present

## 2022-08-13 DIAGNOSIS — N3000 Acute cystitis without hematuria: Secondary | ICD-10-CM | POA: Diagnosis not present

## 2022-08-13 DIAGNOSIS — N3281 Overactive bladder: Secondary | ICD-10-CM | POA: Diagnosis not present

## 2022-08-13 DIAGNOSIS — Z741 Need for assistance with personal care: Secondary | ICD-10-CM | POA: Diagnosis not present

## 2022-08-13 DIAGNOSIS — R69 Illness, unspecified: Secondary | ICD-10-CM | POA: Diagnosis not present

## 2022-08-13 DIAGNOSIS — Z8249 Family history of ischemic heart disease and other diseases of the circulatory system: Secondary | ICD-10-CM | POA: Diagnosis not present

## 2022-08-13 DIAGNOSIS — N3289 Other specified disorders of bladder: Secondary | ICD-10-CM | POA: Diagnosis not present

## 2022-08-13 DIAGNOSIS — E1159 Type 2 diabetes mellitus with other circulatory complications: Secondary | ICD-10-CM | POA: Diagnosis not present

## 2022-08-13 DIAGNOSIS — Z9841 Cataract extraction status, right eye: Secondary | ICD-10-CM

## 2022-08-13 DIAGNOSIS — I959 Hypotension, unspecified: Secondary | ICD-10-CM | POA: Diagnosis not present

## 2022-08-13 DIAGNOSIS — Z9181 History of falling: Secondary | ICD-10-CM | POA: Diagnosis not present

## 2022-08-13 DIAGNOSIS — Z823 Family history of stroke: Secondary | ICD-10-CM | POA: Diagnosis not present

## 2022-08-13 DIAGNOSIS — I700 Atherosclerosis of aorta: Secondary | ICD-10-CM | POA: Diagnosis not present

## 2022-08-13 DIAGNOSIS — Z6841 Body Mass Index (BMI) 40.0 and over, adult: Secondary | ICD-10-CM | POA: Diagnosis not present

## 2022-08-13 DIAGNOSIS — L03317 Cellulitis of buttock: Principal | ICD-10-CM | POA: Diagnosis present

## 2022-08-13 DIAGNOSIS — L89302 Pressure ulcer of unspecified buttock, stage 2: Secondary | ICD-10-CM | POA: Diagnosis not present

## 2022-08-13 DIAGNOSIS — Z885 Allergy status to narcotic agent status: Secondary | ICD-10-CM | POA: Diagnosis not present

## 2022-08-13 DIAGNOSIS — M6281 Muscle weakness (generalized): Secondary | ICD-10-CM | POA: Diagnosis not present

## 2022-08-13 DIAGNOSIS — E1169 Type 2 diabetes mellitus with other specified complication: Secondary | ICD-10-CM | POA: Diagnosis not present

## 2022-08-13 DIAGNOSIS — Z794 Long term (current) use of insulin: Secondary | ICD-10-CM | POA: Diagnosis not present

## 2022-08-13 DIAGNOSIS — G9341 Metabolic encephalopathy: Secondary | ICD-10-CM | POA: Diagnosis not present

## 2022-08-13 DIAGNOSIS — E669 Obesity, unspecified: Secondary | ICD-10-CM | POA: Diagnosis present

## 2022-08-13 DIAGNOSIS — I10 Essential (primary) hypertension: Secondary | ICD-10-CM | POA: Diagnosis present

## 2022-08-13 DIAGNOSIS — R278 Other lack of coordination: Secondary | ICD-10-CM | POA: Diagnosis not present

## 2022-08-13 DIAGNOSIS — E876 Hypokalemia: Secondary | ICD-10-CM | POA: Diagnosis present

## 2022-08-13 DIAGNOSIS — E559 Vitamin D deficiency, unspecified: Secondary | ICD-10-CM | POA: Diagnosis not present

## 2022-08-13 DIAGNOSIS — I6990 Unspecified sequelae of unspecified cerebrovascular disease: Secondary | ICD-10-CM | POA: Diagnosis not present

## 2022-08-13 DIAGNOSIS — F02C0 Dementia in other diseases classified elsewhere, severe, without behavioral disturbance, psychotic disturbance, mood disturbance, and anxiety: Secondary | ICD-10-CM | POA: Diagnosis not present

## 2022-08-13 DIAGNOSIS — A419 Sepsis, unspecified organism: Secondary | ICD-10-CM | POA: Diagnosis not present

## 2022-08-13 LAB — CBG MONITORING, ED: Glucose-Capillary: 183 mg/dL — ABNORMAL HIGH (ref 70–99)

## 2022-08-13 LAB — URINALYSIS, ROUTINE W REFLEX MICROSCOPIC
Bilirubin Urine: NEGATIVE
Glucose, UA: 50 mg/dL — AB
Hgb urine dipstick: NEGATIVE
Ketones, ur: NEGATIVE mg/dL
Leukocytes,Ua: NEGATIVE
Nitrite: NEGATIVE
Protein, ur: NEGATIVE mg/dL
Specific Gravity, Urine: 1.015 (ref 1.005–1.030)
pH: 5 (ref 5.0–8.0)

## 2022-08-13 LAB — COMPREHENSIVE METABOLIC PANEL
ALT: 15 U/L (ref 0–44)
AST: 15 U/L (ref 15–41)
Albumin: 3 g/dL — ABNORMAL LOW (ref 3.5–5.0)
Alkaline Phosphatase: 98 U/L (ref 38–126)
Anion gap: 10 (ref 5–15)
BUN: 25 mg/dL — ABNORMAL HIGH (ref 8–23)
CO2: 28 mmol/L (ref 22–32)
Calcium: 9.4 mg/dL (ref 8.9–10.3)
Chloride: 102 mmol/L (ref 98–111)
Creatinine, Ser: 0.84 mg/dL (ref 0.44–1.00)
GFR, Estimated: >60 mL/min (ref >60)
Glucose, Bld: 194 mg/dL — ABNORMAL HIGH (ref 70–99)
Potassium: 3.7 mmol/L (ref 3.5–5.1)
Sodium: 140 mmol/L (ref 135–145)
Total Bilirubin: 0.7 mg/dL (ref 0.3–1.2)
Total Protein: 6.5 g/dL (ref 6.5–8.1)

## 2022-08-13 LAB — PROTIME-INR
INR: 1 (ref 0.8–1.2)
Prothrombin Time: 13.2 seconds (ref 11.4–15.2)

## 2022-08-13 LAB — CBC WITH DIFFERENTIAL/PLATELET
Abs Immature Granulocytes: 0.04 10*3/uL (ref 0.00–0.07)
Basophils Absolute: 0.1 10*3/uL (ref 0.0–0.1)
Basophils Relative: 1 %
Eosinophils Absolute: 0.2 10*3/uL (ref 0.0–0.5)
Eosinophils Relative: 1 %
HCT: 39.8 % (ref 36.0–46.0)
Hemoglobin: 13 g/dL (ref 12.0–15.0)
Immature Granulocytes: 0 %
Lymphocytes Relative: 13 %
Lymphs Abs: 1.8 10*3/uL (ref 0.7–4.0)
MCH: 29.5 pg (ref 26.0–34.0)
MCHC: 32.7 g/dL (ref 30.0–36.0)
MCV: 90.5 fL (ref 80.0–100.0)
Monocytes Absolute: 1.5 10*3/uL — ABNORMAL HIGH (ref 0.1–1.0)
Monocytes Relative: 11 %
Neutro Abs: 9.8 10*3/uL — ABNORMAL HIGH (ref 1.7–7.7)
Neutrophils Relative %: 74 %
Platelets: 245 10*3/uL (ref 150–400)
RBC: 4.4 MIL/uL (ref 3.87–5.11)
RDW: 13.2 % (ref 11.5–15.5)
WBC: 13.3 10*3/uL — ABNORMAL HIGH (ref 4.0–10.5)
nRBC: 0 % (ref 0.0–0.2)

## 2022-08-13 LAB — TROPONIN I (HIGH SENSITIVITY)
Troponin I (High Sensitivity): 23 ng/L — ABNORMAL HIGH (ref <18)
Troponin I (High Sensitivity): 24 ng/L — ABNORMAL HIGH (ref <18)

## 2022-08-13 LAB — MAGNESIUM: Magnesium: 1.5 mg/dL — ABNORMAL LOW (ref 1.7–2.4)

## 2022-08-13 LAB — LACTIC ACID, PLASMA
Lactic Acid, Venous: 1.9 mmol/L (ref 0.5–1.9)
Lactic Acid, Venous: 2.9 mmol/L (ref 0.5–1.9)

## 2022-08-13 LAB — SEDIMENTATION RATE: Sed Rate: 30 mm/hr — ABNORMAL HIGH (ref 0–22)

## 2022-08-13 LAB — GLUCOSE, CAPILLARY: Glucose-Capillary: 178 mg/dL — ABNORMAL HIGH (ref 70–99)

## 2022-08-13 MED ORDER — ONDANSETRON HCL 4 MG/2ML IJ SOLN: 4.0000 mg | Freq: Four times a day (QID) | INTRAMUSCULAR | Status: DC | PRN

## 2022-08-13 MED ORDER — VANCOMYCIN HCL IN DEXTROSE 1-5 GM/200ML-% IV SOLN: 1000.0000 mg | Freq: Once | INTRAVENOUS | Status: DC

## 2022-08-13 MED ORDER — ASPIRIN 325 MG PO TABS
325.0000 mg | ORAL_TABLET | Freq: Every day | ORAL | Status: DC
  Administered 2022-08-14 – 2022-08-20 (×7): 325 mg via ORAL
  Filled 2022-08-13 (×7): qty 1

## 2022-08-13 MED ORDER — MAGNESIUM SULFATE 2 GM/50ML IV SOLN
2.0000 g | Freq: Once | INTRAVENOUS | Status: AC
  Administered 2022-08-13: 2 g via INTRAVENOUS
  Filled 2022-08-13: qty 50

## 2022-08-13 MED ORDER — METOPROLOL SUCCINATE ER 50 MG PO TB24
50.0000 mg | ORAL_TABLET | Freq: Every day | ORAL | Status: DC
  Administered 2022-08-14 – 2022-08-19 (×6): 50 mg via ORAL
  Filled 2022-08-13 (×6): qty 1

## 2022-08-13 MED ORDER — LACTATED RINGERS IV BOLUS
1000.0000 mL | Freq: Once | INTRAVENOUS | Status: AC
  Administered 2022-08-13: 1000 mL via INTRAVENOUS

## 2022-08-13 MED ORDER — ENOXAPARIN SODIUM 40 MG/0.4ML IJ SOSY
40.0000 mg | PREFILLED_SYRINGE | INTRAMUSCULAR | Status: DC
  Administered 2022-08-14 – 2022-08-20 (×7): 40 mg via SUBCUTANEOUS
  Filled 2022-08-13 (×7): qty 0.4

## 2022-08-13 MED ORDER — VANCOMYCIN HCL 750 MG/150ML IV SOLN
750.0000 mg | INTRAVENOUS | Status: AC
  Administered 2022-08-14 – 2022-08-18 (×5): 750 mg via INTRAVENOUS
  Filled 2022-08-13 (×5): qty 150

## 2022-08-13 MED ORDER — LACTATED RINGERS IV BOLUS (SEPSIS)
1000.0000 mL | Freq: Once | INTRAVENOUS | Status: AC
Start: 2022-08-13 — End: 2022-08-13
  Administered 2022-08-13: 1000 mL via INTRAVENOUS

## 2022-08-13 MED ORDER — ACETAMINOPHEN 650 MG RE SUPP: 650.0000 mg | Freq: Four times a day (QID) | RECTAL | Status: DC | PRN

## 2022-08-13 MED ORDER — VANCOMYCIN HCL 1750 MG/350ML IV SOLN
1750.0000 mg | Freq: Once | INTRAVENOUS | Status: AC
  Administered 2022-08-14: 1750 mg via INTRAVENOUS

## 2022-08-13 MED ORDER — AMLODIPINE BESYLATE 5 MG PO TABS
5.0000 mg | ORAL_TABLET | Freq: Every day | ORAL | Status: DC
  Administered 2022-08-14 – 2022-08-20 (×7): 5 mg via ORAL
  Filled 2022-08-13 (×7): qty 1

## 2022-08-13 MED ORDER — INSULIN ASPART 100 UNIT/ML IJ SOLN: 0.0000 [IU] | Freq: Every day | INTRAMUSCULAR | Status: DC

## 2022-08-13 MED ORDER — POTASSIUM CHLORIDE IN NACL 20-0.9 MEQ/L-% IV SOLN: INTRAVENOUS | Status: AC

## 2022-08-13 MED ORDER — ACETAMINOPHEN 325 MG PO TABS
650.0000 mg | ORAL_TABLET | Freq: Four times a day (QID) | ORAL | Status: DC | PRN
  Administered 2022-08-14 – 2022-08-18 (×2): 650 mg via ORAL
  Filled 2022-08-13 (×2): qty 2

## 2022-08-13 MED ORDER — POLYETHYLENE GLYCOL 3350 17 G PO PACK: 17.0000 g | PACK | Freq: Every day | ORAL | Status: DC | PRN

## 2022-08-13 MED ORDER — ONDANSETRON HCL 4 MG PO TABS: 4.0000 mg | ORAL_TABLET | Freq: Four times a day (QID) | ORAL | Status: DC | PRN

## 2022-08-13 MED ORDER — SODIUM CHLORIDE 0.9 % IV SOLN
1.0000 g | INTRAVENOUS | Status: DC
  Administered 2022-08-14 – 2022-08-18 (×5): 1 g via INTRAVENOUS
  Filled 2022-08-13 (×5): qty 10

## 2022-08-13 MED ORDER — FENTANYL CITRATE PF 50 MCG/ML IJ SOSY
25.0000 ug | PREFILLED_SYRINGE | Freq: Once | INTRAMUSCULAR | Status: AC
  Administered 2022-08-13: 25 ug via INTRAVENOUS
  Filled 2022-08-13: qty 1

## 2022-08-13 MED ORDER — SODIUM CHLORIDE 0.9 % IV SOLN
2.0000 g | Freq: Once | INTRAVENOUS | Status: AC
  Administered 2022-08-13: 2 g via INTRAVENOUS
  Filled 2022-08-13: qty 12.5

## 2022-08-13 MED ORDER — INSULIN ASPART 100 UNIT/ML IJ SOLN
0.0000 [IU] | Freq: Three times a day (TID) | INTRAMUSCULAR | Status: DC
  Administered 2022-08-14: 1 [IU] via SUBCUTANEOUS
  Administered 2022-08-14 (×2): 3 [IU] via SUBCUTANEOUS
  Administered 2022-08-15 (×2): 1 [IU] via SUBCUTANEOUS
  Administered 2022-08-15: 2 [IU] via SUBCUTANEOUS
  Administered 2022-08-16: 1 [IU] via SUBCUTANEOUS
  Administered 2022-08-16 – 2022-08-17 (×5): 2 [IU] via SUBCUTANEOUS
  Administered 2022-08-18: 1 [IU] via SUBCUTANEOUS
  Administered 2022-08-18: 2 [IU] via SUBCUTANEOUS
  Administered 2022-08-18: 1 [IU] via SUBCUTANEOUS
  Administered 2022-08-19 (×2): 2 [IU] via SUBCUTANEOUS
  Administered 2022-08-20: 3 [IU] via SUBCUTANEOUS

## 2022-08-13 MED ORDER — LACTATED RINGERS IV BOLUS (SEPSIS)
1000.0000 mL | Freq: Once | INTRAVENOUS | Status: AC
  Administered 2022-08-13: 1000 mL via INTRAVENOUS

## 2022-08-13 NOTE — ED Notes (Signed)
EDP at bedside  

## 2022-08-13 NOTE — ED Notes (Addendum)
Lab is collecting blood at this time. IV access if being attempted at this time as well due to pt being hard stick.

## 2022-08-13 NOTE — ED Notes (Signed)
Pt has a pressure sore to the lower back all the way down buttocks. Various stages of healing. Redness and scabbing noted to the areas. Pt cleaned and barrier cream placed on areas.

## 2022-08-13 NOTE — Assessment & Plan Note (Addendum)
Cellulitis of skin involving both buttocks. Leukocytosis of 13.3.  Rules out for sepsis.  Secondary to stage II decubitus ulcers. CT chest abdomen and pelvis without contrast shows mild subcutaneous edema within the buttocks which could reflect cellulitis, no evidence of abscess or osteomyelitis. -Continue IV vancomycin and ceftriaxone -1 L bolus given, continue N/s + 20 kcl 75cc/hr x 20hrs -Follow-up blood and urine cultures -Wound care consult

## 2022-08-13 NOTE — H&P (Signed)
History and Physical    [name] [id number] DOB: May 21, 1929 DOA: 08/13/2022  PCP: Sharilyn Sites, MD   Patient coming from: Home  I have personally briefly reviewed patient's old medical records in [facility]  Chief Complaint: AMS  HPI: [name] is a 88 y.o. female with medical history significant for diabetes mellitus, hypertension, stroke, overactive bladder. Patient was brought to the ED reports of confusion, lethargy.  At the time of my evaluation, patient is awake alert oriented and able to answer questions appropriately.  Patient's daughter Butch Penny, and spouse at bedside.  Spouse is 51 years old, patient lives with spouse.  Patient was recently admitted to the hospital 8/9 to 8/11 for hypoglycemia, and 7/17 to 7/19 for UTI.  She was discharged to [facility].  She left the [facility] by about a week ago.  Since getting home she has been weak and unable to ambulate, over the past few days, patient has been having confused speech, and family reports she was playing with feces. At baseline, patient has some dementia, she has good days and bad days with intermittent confused speech.  She previously was able to ambulate with a wheelchair, transfer, and stand.  Since getting home she has not been able to do any of this and has been lying in bed. Oral intake has been okay, no vomiting no loose stools.  She reports some pain in her buttock area.  No cough no fevers.  ED Course: Temperature 98.1.  Heart rate 89-92.  Respirate rate 13-17.  Blood pressure systolic 27-253.  O2 sats greater than 96% on room air.  WBC 13.3.  Lactic acid 1.9. Head CT without acute abnormality.  UA not suggestive of infection.  Troponin 24 > 2 3. CT chest abdomen pelvis without contrast-possible mild subcutaneous edema within the buttocks which could reflect cellulitis.  No abscess or osteomyelitis. IV vancomycin cefepime started. Blood and urine cultures obtained 1 L bolus  given Hospitalist to admit for altered mental status, buttock cellulitis.  Review of Systems: As per HPI all other systems reviewed and negative.  Past Medical History:  Diagnosis Date   Diabetes mellitus with neuropathy    Diabetic neuropathy ([place])    Hypertension    Stroke [facility])    Urinary incontinence     Past Surgical History:  Procedure Laterality Date   ABDOMINAL HYSTERECTOMY     APPENDECTOMY     BREAST SURGERY     begnin tumor removed   CATARACT EXTRACTION, BILATERAL       reports that she has never smoked. She has never used smokeless tobacco. She reports that she does not drink alcohol and does not use drugs.  Allergies  Allergen Reactions   Codeine Nausea And Vomiting    Family History  Problem Relation Age of Onset   CAD Mother    Hypertension Mother    Stroke Father    Heart attack Brother    Cancer Brother    Prior to Admission medications   Medication Sig Start Date End Date Taking? Authorizing Provider  amLODipine (NORVASC) 5 MG tablet Take 1 tablet (5 mg total) by mouth daily. 07/25/22  Yes Gerlene Fee, NP  aspirin 325 MG tablet Take 1 tablet (325 mg total) by mouth daily. 03/02/20  Yes Barton Dubois, MD  Cholecalciferol (VITAMIN D3) 5000 units CAPS Take 1 capsule (5,000 Units total) by mouth daily. 04/15/17  Yes Cassandria Anger, MD  diclofenac Sodium (VOLTAREN ARTHRITIS PAIN) 1 %  GEL Apply 2 g topically 3 (three) times daily. bilateral knees for osteoarthritis 07/25/22  Yes Green, Deborah S, NP  gabapentin (NEURONTIN) 300 MG capsule Take 300 mg three times daily 07/25/22  Yes Green, Deborah S, NP  Magnesium 200 MG TABS Take 1 tablet (200 mg total) by mouth daily. 07/25/22  Yes Green, Deborah S, NP  metFORMIN (GLUCOPHAGE) 500 MG tablet Take 1 tablet (500 mg total) by mouth 2 (two) times daily with a meal. 08/04/22  Yes Tat, David, MD  metoprolol succinate (TOPROL-XL) 50 MG 24 hr tablet Take 1 tablet (50 mg total) by mouth at bedtime. Take with or  immediately following a meal. 07/25/22  Yes Green, Deborah S, NP  simvastatin (ZOCOR) 20 MG tablet Take 1 tablet (20 mg total) by mouth every evening. 07/25/22  Yes Green, Deborah S, NP  blood glucose meter kit and supplies KIT Dispense based on patient and insurance preference. Use to TEST BLOOD GLUCOSE THREE times daily as directed. DX E11.65 02/10/21   Nida, Gebreselassie W, MD  furosemide (LASIX) 40 MG tablet Take 1 tablet (40 mg total) by mouth daily as needed for fluid or edema. Patient not taking: Reported on 08/13/2022 07/25/22   Green, Deborah S, NP  Insulin Pen Needle (PEN NEEDLES) 31G X 8 MM MISC Use as directed to inject insulin once daily 08/15/20   Nida, Gebreselassie W, MD  solifenacin (VESICARE) 5 MG tablet TAKE ONE TABLT (5 MG TOTAL) BY MOUTH DAILY. Patient not taking: Reported on 08/13/2022 07/25/22   Green, Deborah S, NP  Trospium Chloride 60 MG CP24 Take 1 capsule by mouth daily. Patient not taking: Reported on 08/13/2022 07/25/22   [provider]    Physical Exam: Vitals:   08/13/22 1946 08/13/22 2020 08/13/22 2030 08/13/22 2104  BP: (!) 142/94 (!) 151/75 (!) 148/93 97/76  Pulse: 89 89 92 90  Resp: 15 17 15 13  Temp: 98 F (36.7 C)     TempSrc:      SpO2: 96% 97% 100% 98%  Weight:      Height:        Constitutional: NAD, calm, comfortable Vitals:   08/13/22 1946 08/13/22 2020 08/13/22 2030 08/13/22 2104  BP: (!) 142/94 (!) 151/75 (!) 148/93 97/76  Pulse: 89 89 92 90  Resp: 15 17 15 13  Temp: 98 F (36.7 C)     TempSrc:      SpO2: 96% 97% 100% 98%  Weight:      Height:       Eyes: PERRL, lids and conjunctivae normal ENMT: Mucous membranes are moist.  Neck: normal, supple, no masses, no thyromegaly Respiratory: clear to auscultation bilaterally, no wheezing, no crackles. Normal respiratory effort. No accessory muscle use.  Cardiovascular: Regular rate and rhythm, no murmurs / rubs / gallops. No extremity edema.  Abdomen: no tenderness, no masses palpated. No  hepatosplenomegaly. Bowel sounds positive.  Musculoskeletal: no clubbing / cyanosis. No joint deformity upper and lower extremities.  Skin: Extensive area of skin breakdown to bilateral buttock area, also involving the cleft, with some minimal areas of purulence, erythema, no induration. Neurologic: No apparent cranial nerve abnormality, moving extremities spontaneously.  Psychiatric: Normal judgment and insight. Alert and oriented x 3. Normal mood.       Labs on Admission: I have personally reviewed following labs and imaging studies  CBC: Recent Labs  Lab 08/13/22 1942  WBC 13.3*  NEUTROABS 9.8*  HGB 13.0  HCT 39.8  MCV 90.5  PLT 245     Basic Metabolic Panel: Recent Labs  Lab 08/13/22 1942  NA 140  K 3.7  CL 102  CO2 28  GLUCOSE 194*  BUN 25*  CREATININE 0.84  CALCIUM 9.4  MG 1.5*   GFR: Estimated Creatinine Clearance: 47.2 mL/min (by C-G formula based on SCr of 0.84 mg/dL). Liver Function Tests: Recent Labs  Lab 08/13/22 1942  AST 15  ALT 15  ALKPHOS 98  BILITOT 0.7  PROT 6.5  ALBUMIN 3.0*   Coagulation Profile: Recent Labs  Lab 08/13/22 1942  INR 1.0   CBG: Recent Labs  Lab 08/13/22 1952  GLUCAP 183*   Urine analysis:    Component Value Date/Time   COLORURINE YELLOW 08/13/2022 1951   APPEARANCEUR CLEAR 08/13/2022 1951   APPEARANCEUR Clear 10/25/2021 1450   LABSPEC 1.015 08/13/2022 1951   PHURINE 5.0 08/13/2022 1951   GLUCOSEU 50 (A) 08/13/2022 1951   HGBUR NEGATIVE 08/13/2022 1951   BILIRUBINUR NEGATIVE 08/13/2022 1951   BILIRUBINUR Negative 10/25/2021 1450   KETONESUR NEGATIVE 08/13/2022 1951   PROTEINUR NEGATIVE 08/13/2022 1951   UROBILINOGEN 0.2 09/06/2009 0135   NITRITE NEGATIVE 08/13/2022 1951   LEUKOCYTESUR NEGATIVE 08/13/2022 1951    Radiological Exams on Admission: CT CHEST ABDOMEN PELVIS WO CONTRAST  Result Date: 08/13/2022 CLINICAL DATA:  Sepsis. Altered mental status. Possible buttock/peritoneal abscess. EXAM: CT CHEST,  ABDOMEN AND PELVIS WITHOUT CONTRAST TECHNIQUE: Multidetector CT imaging of the chest, abdomen and pelvis was performed following the standard protocol without IV contrast. RADIATION DOSE REDUCTION: This exam was performed according to the departmental dose-optimization program which includes automated exposure control, adjustment of the mA and/or kV according to patient size and/or use of iterative reconstruction technique. COMPARISON:  Abdominopelvic CT 09/06/2009. FINDINGS: CT CHEST FINDINGS Cardiovascular: Extensive coronary artery atherosclerosis with lesser involvement of the aorta and great vessels. No acute vascular findings on noncontrast imaging. There are probable calcifications of the aortic valve. The heart size is normal. There is no pericardial effusion. Mediastinum/Nodes: There are no enlarged mediastinal, hilar or axillary lymph nodes. Hilar assessment is limited by the lack of intravenous contrast. Small hiatal hernia. The thyroid gland and trachea appear unremarkable. Lungs/Pleura: No pleural effusion or pneumothorax. Mild emphysematous changes and mild biapical scarring. No airspace disease or suspicious pulmonary nodularity. Musculoskeletal/Chest wall: No chest wall mass or suspicious osseous findings. CT ABDOMEN AND PELVIS FINDINGS Hepatobiliary: The liver appears unremarkable as imaged in the noncontrast state. No evidence of gallstones, gallbladder wall thickening or biliary dilatation. Pancreas: Unremarkable. No pancreatic ductal dilatation or surrounding inflammatory changes. Spleen: Normal in size without focal abnormality. Adrenals/Urinary Tract: Both adrenal glands appear normal. The kidneys appear normal without evidence of urinary tract calculus, suspicious lesion or hydronephrosis. The bladder is moderately distended without wall thickening or surrounding inflammation. Stomach/Bowel: No enteric contrast administered. The stomach appears unremarkable for its degree of distension. No  evidence of bowel wall thickening, distention or surrounding inflammatory change. Moderate stool throughout the colon. Vascular/Lymphatic: There are no enlarged abdominal or pelvic lymph nodes. There are mildly prominent inguinal lymph nodes, measuring up to 1.4 cm on the left (image 120/2). Aortic and branch vessel atherosclerosis without evidence of aneurysm. Reproductive: Status post hysterectomy. Low-density left adnexal lesion has enlarged from remote CT, measuring 4.7 x 3.2 cm on image 104/2 and 8 HU (previously 3.1 x 2.0 cm). Other: Mild perirectal soft tissue stranding. No ascites or focal extraluminal fluid collection within the abdomen or pelvis. No free intraperitoneal air. Musculoskeletal: No acute or significant osseous findings. Previous L4-5  fusion. Multilevel spondylosis associated with a thoracolumbar scoliosis. Possible mild subcutaneous edema within the buttocks, but no focal fluid collection, foreign body or soft tissue emphysema identified. IMPRESSION: 1. Possible mild subcutaneous edema within the buttocks which could reflect cellulitis. No evidence of abscess or osteomyelitis. 2. No other evidence of active infection within the chest, abdomen or pelvis. 3. Nonspecific prominent left inguinal lymph node which could be reactive, especially if there is evidence of pelvic inflammation clinically. 4. Slow growth of low-density left adnexal lesion from 2010, suspicious for benign cystic neoplasm. Given the patient's age of 89 years and the slow growth of this lesion, this is of doubtful clinical significance. 5. Distended bladder. 6. Coronary and aortic atherosclerosis (ICD10-I70.0). Emphysema (ICD10-J43.9). Electronically Signed   By: William  Veazey M.D.   On: 08/13/2022 18:44   CT Head Wo Contrast  Result Date: 08/13/2022 CLINICAL DATA:  Mental status change, unknown cause EXAM: CT HEAD WITHOUT CONTRAST TECHNIQUE: Contiguous axial images were obtained from the base of the skull through the  vertex without intravenous contrast. RADIATION DOSE REDUCTION: This exam was performed according to the departmental dose-optimization program which includes automated exposure control, adjustment of the mA and/or kV according to patient size and/or use of iterative reconstruction technique. COMPARISON:  07/07/2022 FINDINGS: Brain: No evidence of acute infarction, hemorrhage, hydrocephalus, extra-axial collection or mass lesion/mass effect. Patchy low-density changes within the periventricular and subcortical white matter compatible with chronic microvascular ischemic change. Mild diffuse cerebral volume loss. Vascular: Atherosclerotic calcifications involving the large vessels of the skull base. No unexpected hyperdense vessel. Skull: Normal. Negative for fracture or focal lesion. Sinuses/Orbits: No acute finding. Other: None. IMPRESSION: 1. No acute intracranial abnormality. 2. Chronic microvascular ischemic change and cerebral volume loss. Electronically Signed   By: Nicholas  Plundo D.O.   On: 08/13/2022 18:31   DG Chest 1 View  Result Date: 08/13/2022 CLINICAL DATA:  Altered mental status since yesterday. EXAM: CHEST  1 VIEW COMPARISON:  Radiographs 07/09/2022 and 02/29/2020. FINDINGS: 1811 hours. The heart size and mediastinal contours are normal. The lungs are clear. There is no pleural effusion or pneumothorax. No acute osseous findings are identified. Postsurgical changes at the right shoulder with asymmetric glenohumeral degenerative changes on the left. Telemetry leads overlie the chest. IMPRESSION: No evidence of active cardiopulmonary process. Electronically Signed   By: William  Veazey M.D.   On: 08/13/2022 18:25    EKG: Independently reviewed.  Sinus rhythm rate 80, PVCs.  Old LBBB.  No change from prior.  Assessment/Plan Principal Problem:   Cellulitis of buttock Active Problems:   Acute metabolic encephalopathy   Essential hypertension   Diabetic neuropathy associated with type 2  diabetes mellitus (HCC)   Major neurocognitive disorder (HCC)   Hypomagnesemia   Assessment and Plan: * Cellulitis of buttock Cellulitis of skin involving both buttocks. Leukocytosis of 13.3.  Rules out for sepsis.  Secondary to stage II decubitus ulcers. CT chest abdomen and pelvis without contrast shows mild subcutaneous edema within the buttocks which could reflect cellulitis, no evidence of abscess or osteomyelitis. -Continue IV vancomycin and ceftriaxone -1 L bolus given, continue N/s + 20 kcl 75cc/hr x 20hrs -Follow-up blood and urine cultures -Wound care consult  Acute metabolic encephalopathy Reports of lethargy, confused speech, playing with feces.  Likely secondary to cellulitis.  Baseline dementia, with intermittent episodes of confused speech.  CT without acute abnormality.  Chest x-ray UA not suggestive of infection. - Antibiotics and hydration -Hold gabapentin  Essential hypertension Stable. -Resume Norvasc,   metoprolol in a.m.  Hypomagnesemia Magnesium 1.5.  Repleted  Diabetic neuropathy associated with type 2 diabetes mellitus (HCC) Controlled - A1c 5.1.  Glucose 194.  Recent hospitalization for hypoglycemia requiring D10 infusion.  Changes were made to her diabetic regimen. -Hold metformin - SSI- S   DVT prophylaxis: Lovenox Code Status: DNR-confirmed with daughter Donna and spouse at bedside. Family Communication: Daughter Donna and spouse at bedside. Disposition Plan:> 2 days.  Patient will need placement.  Patient currently stays with spouse who is 91 years old and unable to care for patient.  Patient is needing more care, unable to ambulate or transfer.  Daughter and spouse are worried, they unable to care for patient anymore Consults called: [facility]One Admission status: Inpatient, telemetry I certify that at the point of admission it is my clinical judgment that the patient will require inpatient hospital care spanning beyond 2 midnights from the point of admission  due to high intensity of service, high risk for further deterioration and high frequency of surveillance required.    Author:  E , MD 08/13/2022 10:24 PM  For on call review [email or web address].  

## 2022-08-13 NOTE — ED Notes (Signed)
1,100 ml emptied from bladder with in and out cath

## 2022-08-13 NOTE — Assessment & Plan Note (Addendum)
Controlled - A1c 5.1.  Glucose 194.  Recent hospitalization for hypoglycemia requiring D10 infusion.  Changes were made to her diabetic regimen. -Hold metformin - SSI- S

## 2022-08-13 NOTE — ED Triage Notes (Signed)
Pt arrive via [facility] for AMS since yesterday. Pt normally ambulates herself but now she is unable to slid herself up in the bed. BS170. Pt c/o lethargic,and playing in her feces.

## 2022-08-13 NOTE — ED Provider Notes (Signed)
[facility] EMERGENCY DEPARTMENT Provider Note   CSN: [id number] Arrival date & time: 08/13/22  1720     History {Add pertinent medical, surgical, social history, OB history to HPI:1} No chief complaint on file.   [name] is a 88 y.o. female.  HPI Patient presents for generalized weakness.  Medical history includes T2DM, HTN, HLD, TIA, overactive bladder, chronic constipation, neuropathy.  She had a recent hospital admission 2 weeks ago for altered mental status.  At the time, her blood glucose was low.  She required D10 gtt.  She underwent changes to her home glucose control regimen.  She resides at home with her elderly husband.  She is reportedly ambulatory at baseline.  Today, she was unable to get up out of the bed.  On arrival in the ED, patient reported a generalized pain.  On initial assessment, patient denies any areas of discomfort.    Home Medications Prior to Admission medications   Medication Sig Start Date End Date Taking? Authorizing Provider  amLODipine (NORVASC) 5 MG tablet Take 1 tablet (5 mg total) by mouth daily. 07/25/22   Gerlene Fee, NP  aspirin 325 MG tablet Take 1 tablet (325 mg total) by mouth daily. 03/02/20   Barton Dubois, MD  blood glucose meter kit and supplies KIT Dispense based on patient and insurance preference. Use to TEST BLOOD GLUCOSE THREE times daily as directed. DX E11.65 02/10/21   Cassandria Anger, MD  Cholecalciferol (VITAMIN D3) 5000 units CAPS Take 1 capsule (5,000 Units total) by mouth daily. 04/15/17   Cassandria Anger, MD  diclofenac Sodium (VOLTAREN ARTHRITIS PAIN) 1 % GEL Apply 2 g topically 3 (three) times daily. bilateral knees for osteoarthritis 07/25/22   Gerlene Fee, NP  furosemide (LASIX) 40 MG tablet Take 1 tablet (40 mg total) by mouth daily as needed for fluid or edema. 07/25/22   Gerlene Fee, NP  gabapentin (NEURONTIN) 300 MG capsule Take 300 mg three times daily 07/25/22   Gerlene Fee, NP   Insulin Pen Needle (PEN NEEDLES) 31G X 8 MM MISC Use as directed to inject insulin once daily 08/15/20   Cassandria Anger, MD  Magnesium 200 MG TABS Take 1 tablet (200 mg total) by mouth daily. 07/25/22   Gerlene Fee, NP  metFORMIN (GLUCOPHAGE) 500 MG tablet Take 1 tablet (500 mg total) by mouth 2 (two) times daily with a meal. 08/04/22   Tat, Shanon Brow, MD  metoprolol succinate (TOPROL-XL) 50 MG 24 hr tablet Take 1 tablet (50 mg total) by mouth at bedtime. Take with or immediately following a meal. 07/25/22   Gerlene Fee, NP  simvastatin (ZOCOR) 20 MG tablet Take 1 tablet (20 mg total) by mouth every evening. 07/25/22   Gerlene Fee, NP  solifenacin (VESICARE) 5 MG tablet TAKE ONE TABLT (5 MG TOTAL) BY MOUTH DAILY. 07/25/22   Gerlene Fee, NP  Trospium Chloride 60 MG CP24 Take 1 capsule by mouth daily. 07/25/22   [provider]      Allergies    Codeine    Review of Systems   Review of Systems  Neurological:  Positive for weakness (Generalized).  Psychiatric/Behavioral:  Positive for confusion.   All other systems reviewed and are negative.   Physical Exam Updated Vital Signs There were no vitals taken for this visit. Physical Exam Exam conducted with a chaperone present.  Constitutional:      General: She is not in acute distress.  Appearance: Normal appearance. She is normal weight. She is ill-appearing. She is not toxic-appearing or diaphoretic.  HENT:     Head: Normocephalic and atraumatic.     Right Ear: External ear normal.     Left Ear: External ear normal.     Nose: Nose normal.     Mouth/Throat:     Mouth: Mucous membranes are moist.     Pharynx: Oropharynx is clear.  Eyes:     Extraocular Movements: Extraocular movements intact.  Cardiovascular:     Rate and Rhythm: Normal rate and regular rhythm.  Pulmonary:     Effort: Pulmonary effort is normal. No respiratory distress.     Breath sounds: Normal breath sounds. No wheezing, rhonchi or rales.   Chest:     Chest wall: No tenderness.  Abdominal:     General: There is no distension.     Palpations: Abdomen is soft.     Tenderness: There is no abdominal tenderness.  Genitourinary:    Comments: Areas of erythema, induration, and skin breakdown within gluteal cleft Musculoskeletal:     Cervical back: Normal range of motion.  Skin:    General: Skin is warm and dry.     Coloration: Skin is not jaundiced.  Neurological:     General: No focal deficit present.     Mental Status: She is alert and oriented to person, place, and time.  Psychiatric:        Mood and Affect: Mood normal.        Behavior: Behavior normal.     ED Results / Procedures / Treatments   Labs (all labs ordered are listed, but only abnormal results are displayed) Labs Reviewed - No data to display  EKG None  Radiology No results found.  Procedures Procedures  {Document cardiac monitor, telemetry assessment procedure when appropriate:1}  Medications Ordered in ED Medications - No data to display  ED Course/ Medical Decision Making/ A&P                           Medical Decision Making Amount and/or Complexity of Data Reviewed Labs: ordered. Radiology: ordered. ECG/medicine tests: ordered.  Risk Prescription drug management.   ***  {Document critical care time when appropriate:1} {Document review of labs and clinical decision tools ie heart score, Chads2Vasc2 etc:1}  {Document your independent review of radiology images, and any outside records:1} {Document your discussion with family members, caretakers, and with consultants:1} {Document social determinants of health affecting pt's care:1} {Document your decision making why or why not admission, treatments were needed:1} Final Clinical Impression(s) / ED Diagnoses Final diagnoses:  None    Rx / DC Orders ED Discharge Orders     None

## 2022-08-13 NOTE — Assessment & Plan Note (Addendum)
Reports of lethargy, confused speech, playing with feces.  Likely secondary to cellulitis.  Baseline dementia, with intermittent episodes of confused speech.  CT without acute abnormality.  Chest x-ray UA not suggestive of infection. - Antibiotics and hydration -Hold gabapentin

## 2022-08-13 NOTE — Progress Notes (Signed)
Pharmacy Antibiotic Note  [name] is a 88 y.o. female admitted on 08/13/2022 with  buttock cellulitis .  Pharmacy has been consulted for Vancomycin dosing.  Plan: Vancomycin '1750mg'$  IV now then 750 mg IV Q 24 hrs. Goal AUC 400-550. Expected AUC: 480, SCr used: 0.84, Vd coeff 0.5 Will f/u renal function, micro data, and pt's clinical condition Vanc levels prn   Height: '5\' 4"'$  (162.6 cm) Weight: 85.7 kg (189 lb) IBW/kg (Calculated) : 54.7  Temp (24hrs), Avg:98.1 F (36.7 C), Min:98 F (36.7 C), Max:98.1 F (36.7 C)  Recent Labs  Lab 08/13/22 1941 08/13/22 1942  WBC  --  13.3*  CREATININE  --  0.84  LATICACIDVEN 1.9  --     Estimated Creatinine Clearance: 47.2 mL/min (by C-G formula based on SCr of 0.84 mg/dL).    Allergies  Allergen Reactions   Codeine Nausea And Vomiting    Antimicrobials this admission: 8/21 Cefepime x 1 8/21 Vanc >>   Microbiology results: 8/21 BCx:   UCx:    Thank you for allowing pharmacy to be a part of this patient's care.  Sherlon Handing, PharmD, BCPS Please see amion for complete clinical pharmacist phone list 08/13/2022 10:38 PM

## 2022-08-13 NOTE — Assessment & Plan Note (Signed)
Stable. -Resume Norvasc, metoprolol in a.m.

## 2022-08-13 NOTE — Assessment & Plan Note (Signed)
Magnesium 1.5.  Repleted

## 2022-08-14 DIAGNOSIS — L89302 Pressure ulcer of unspecified buttock, stage 2: Secondary | ICD-10-CM | POA: Diagnosis not present

## 2022-08-14 DIAGNOSIS — F0390 Unspecified dementia without behavioral disturbance: Secondary | ICD-10-CM

## 2022-08-14 DIAGNOSIS — E1142 Type 2 diabetes mellitus with diabetic polyneuropathy: Secondary | ICD-10-CM | POA: Diagnosis not present

## 2022-08-14 DIAGNOSIS — L03317 Cellulitis of buttock: Secondary | ICD-10-CM | POA: Diagnosis not present

## 2022-08-14 DIAGNOSIS — G9341 Metabolic encephalopathy: Secondary | ICD-10-CM | POA: Diagnosis not present

## 2022-08-14 LAB — BASIC METABOLIC PANEL
Anion gap: 7 (ref 5–15)
BUN: 21 mg/dL (ref 8–23)
CO2: 25 mmol/L (ref 22–32)
Calcium: 8.6 mg/dL — ABNORMAL LOW (ref 8.9–10.3)
Chloride: 104 mmol/L (ref 98–111)
Creatinine, Ser: 0.69 mg/dL (ref 0.44–1.00)
GFR, Estimated: >60 mL/min (ref >60)
Glucose, Bld: 226 mg/dL — ABNORMAL HIGH (ref 70–99)
Potassium: 3.5 mmol/L (ref 3.5–5.1)
Sodium: 136 mmol/L (ref 135–145)

## 2022-08-14 LAB — GLUCOSE, CAPILLARY
Glucose-Capillary: 214 mg/dL — ABNORMAL HIGH (ref 70–99)
Glucose-Capillary: 214 mg/dL — ABNORMAL HIGH (ref 70–99)
Glucose-Capillary: 125 mg/dL — ABNORMAL HIGH (ref 70–99)
Glucose-Capillary: 126 mg/dL — ABNORMAL HIGH (ref 70–99)

## 2022-08-14 LAB — CBC
HCT: 32.4 % — ABNORMAL LOW (ref 36.0–46.0)
Hemoglobin: 10.6 g/dL — ABNORMAL LOW (ref 12.0–15.0)
MCH: 29.4 pg (ref 26.0–34.0)
MCHC: 32.7 g/dL (ref 30.0–36.0)
MCV: 90 fL (ref 80.0–100.0)
Platelets: 193 10*3/uL (ref 150–400)
RBC: 3.6 MIL/uL — ABNORMAL LOW (ref 3.87–5.11)
RDW: 13.2 % (ref 11.5–15.5)
WBC: 11.4 10*3/uL — ABNORMAL HIGH (ref 4.0–10.5)
nRBC: 0 % (ref 0.0–0.2)

## 2022-08-14 LAB — C-REACTIVE PROTEIN: CRP: 13.5 mg/dL — ABNORMAL HIGH (ref <1.0)

## 2022-08-14 LAB — LACTIC ACID, PLASMA: Lactic Acid, Venous: 1.4 mmol/L (ref 0.5–1.9)

## 2022-08-14 MED ORDER — MELATONIN 3 MG PO TABS
6.0000 mg | ORAL_TABLET | Freq: Every day | ORAL | Status: DC
  Administered 2022-08-14 – 2022-08-19 (×6): 6 mg via ORAL
  Filled 2022-08-14 (×6): qty 2

## 2022-08-14 MED ORDER — ORAL CARE MOUTH RINSE
15.0000 mL | OROMUCOSAL | Status: DC | PRN
Start: 2022-08-14 — End: 2022-08-20

## 2022-08-14 MED ORDER — MAGNESIUM OXIDE -MG SUPPLEMENT 400 (240 MG) MG PO TABS
400.0000 mg | ORAL_TABLET | Freq: Two times a day (BID) | ORAL | Status: DC
  Administered 2022-08-14 – 2022-08-20 (×13): 400 mg via ORAL
  Filled 2022-08-14 (×13): qty 1

## 2022-08-14 MED ORDER — MEDIHONEY WOUND/BURN DRESSING EX PSTE
1.0000 | PASTE | Freq: Every day | CUTANEOUS | Status: DC
  Administered 2022-08-14 – 2022-08-20 (×7): 1 via TOPICAL
  Filled 2022-08-14: qty 44

## 2022-08-14 NOTE — TOC Initial Note (Signed)
Transition of Care [facility]) - Initial/Assessment Note    Patient Details  Name: [name] MRN: [id number] Date of Birth: May 25, 1929  [facility] of [facility]) CM/SW Contact:    Ihor Gully, LCSW Phone Number: 08/14/2022, 1:49 PM  Clinical Narrative:                 Patient from home with spouse. Admitted for cellulitis of buttock. Considered high risk for readmission. Recently assessed and review of chart reveals patient has we, bsc, and a lift was ordered on 8/11. At baseline, patient uses a wc. She is vaccinated for COVID per daughter  Expected Discharge Plan: [place] Barriers to Discharge: Continued Medical Work up   Patient Goals and CMS Choice        Expected Discharge Plan and Services Expected Discharge Plan: [place]       Living arrangements for the past 2 months: Single Family Home                                      Prior Living Arrangements/Services Living arrangements for the past 2 months: Single Family Home Lives with:: Spouse              Current home services: Home OT, Home PT    Activities of Daily Living Home Assistive Devices/Equipment: Wheelchair ADL Screening (condition at time of admission) Patient's cognitive ability adequate to safely complete daily activities?: Yes Is the patient deaf or have difficulty hearing?: No Does the patient have difficulty seeing, even when wearing glasses/contacts?: No Does the patient have difficulty concentrating, remembering, or making decisions?: Yes Patient able to express need for assistance with ADLs?: No Does the patient have difficulty dressing or bathing?: Yes Independently performs ADLs?: No Communication: Independent Dressing (OT): Needs assistance Is this a change from baseline?: Pre-admission baseline Grooming: Needs assistance Is this a change from baseline?: Pre-admission baseline Feeding: Needs assistance Is this a change from  baseline?: Pre-admission baseline Bathing: Needs assistance Is this a change from baseline?: Pre-admission baseline Toileting: Needs assistance Is this a change from baseline?: Pre-admission baseline In/Out Bed: Needs assistance Is this a change from baseline?: Pre-admission baseline Walks in Home: Needs assistance Is this a change from baseline?: Pre-admission baseline Does the patient have difficulty walking or climbing stairs?: Yes Weakness of Legs: Both Weakness of Arms/Hands: None  Permission Sought/Granted                  Emotional Assessment              Admission diagnosis:  AMS (altered mental status) [R41.82] Cellulitis [L03.90] Patient Active Problem List   Diagnosis Date Noted   Decubitus ulcer of buttock, stage 2 ([place]) 08/13/2022   Hypomagnesemia 08/03/2022   Hypoglycemia associated with diabetes ([place]) 08/02/2022   Hypokalemia 08/02/2022   Poorly controlled type 2 diabetes mellitus with neuropathy ([place]) 07/19/2022   Diabetic neuropathy associated with type 2 diabetes mellitus ([place]) 07/16/2022   Hypertension associated with type 2 diabetes mellitus ([place]) 07/16/2022   Hyperlipidemia associated with type 2 diabetes mellitus ([place]) 07/16/2022   Chronic insomnia 07/16/2022   Bilateral lower extremity edema 07/16/2022   Chronic constipation 07/16/2022   Aortic atherosclerosis ([place]) 07/16/2022   Major neurocognitive disorder ([place]) 07/16/2022   DJD (degenerative joint disease) of knee 07/14/2022   Acute respiratory failure ([place]) 07/09/2022   UTI (urinary tract  infection) 07/09/2022   Late effects of cerebrovascular disease 09/25/2021   Long term (current) use of insulin ([place]) 09/25/2021   Overactive bladder 10/03/2020   TIA (transient ischemic attack) 02/29/2020   DKA, type 2 ([place]) 58/83/2549   Acute metabolic encephalopathy 82/64/1583   Cellulitis of buttock 07/14/2019   Acute encephalopathy 03/24/2019   Hyperlipidemia 02/20/2016   Essential  hypertension 10/10/2015   Controlled type 2 diabetes with neuropathy ([place]) 10/01/2008   PCP:  Sharilyn Sites, MD Pharmacy:   [place], [address] [place] [address] Phone: [phone] Fax: [phone]     Social Determinants of Health (SDOH) Interventions    Readmission Risk Interventions     No data to display

## 2022-08-14 NOTE — Inpatient Diabetes Management (Signed)
Inpatient Diabetes Program Recommendations  AACE/ADA: New Consensus Statement on Inpatient Glycemic Control  Target Ranges:  Prepandial:   less than 140 mg/dL      Peak postprandial:   less than 180 mg/dL (1-2 hours)      Critically ill patients:  140 - 180 mg/dL    Latest Reference Range & Units 08/14/22 07:10 08/14/22 11:02  Glucose-Capillary 70 - 99 mg/dL 214 (H) 214 (H)    Latest Reference Range & Units 08/13/22 19:52 08/13/22 23:42  Glucose-Capillary 70 - 99 mg/dL 183 (H) 178 (H)   Review of Glycemic Control  Diabetes history: DM2 Outpatient Diabetes medications: Metformin 500 mg BID Current orders for Inpatient glycemic control: Novolog 0-9 units TID with meals, Novolog 0-5 units QHS  Inpatient Diabetes Program Recommendations:    Insulin: Please consider ordering Semglee 5 units Q24H.  Outpatient DM medications: Noted patient was recently inpatient 08/02/22-08/03/22 and patient was taking Lantus 30 units QHS, Humalog 5 units TID with meals, Glipizide 5 mg daily. Due to hypoglycemia and low A1C, all insulin and Glipizide was discontinued at discharge. May need to readdress outpatient DM medication at time of discharge.    Thanks, Barnie Alderman, RN, MSN, [place] Diabetes Coordinator Inpatient Diabetes Program [phone] (Team Pager from 8am to [place])

## 2022-08-14 NOTE — Consult Note (Addendum)
WOC Nurse Consult Note: Consult requested for sacrum/buttocks.  Performed remotely after review of progress notes and photo in the EMR.   Pt has red moist macerated patchy areas of full thickness skin loss across bilat buttocks and sacrum.  CT results indicate: Possible mild subcutaneous edema within the buttocks which could reflect cellulitis. No evidence of abscess or osteomyelitis. Appearance is consistent with moisture associated skin damage, as well as a combination of shear and pressure. Pt was reported to be frequently incontinent of stool prior to admission, as well as currently.  ICD-10 CM Codes for Irritant Dermatitis There are also scattered areas of dark red-purple Deep tissue pressure injuries to the wound edges. Pressure Injury POA: Yes Dressing procedure/placement/frequency: Topical treatment orders provided for bedside nurses to perform to assist with removal of nonviable tissue as follows: Apply Medihoney to buttocks/sacrum wound Q day or PRN soiling. Cover with abd pads and tape. Please re-consult if further assistance is needed.  Thank-you,  Julien Girt MSN, [place], [place], [place], [place]

## 2022-08-14 NOTE — Progress Notes (Signed)
PROGRESS NOTE    [name]  YBO:[id number] DOB: 1929/01/23 DOA: 08/13/2022 PCP: Sharilyn Sites, MD    Brief Narrative:   [name] is a 88 y.o. female with past medical history of diabetes, hypertension, stroke, overactive bladder presented to hospital with confusion and lethargy.  He lives with his spouse who is 47 years old.  Of note patient was recently admitted hospital. Between 7/17 to 7/19 for UTI.  Patient was then discharged to pain center.  She was at pain center for a week but family noticed increased confusion and disorientation and was playing with feces.  At baseline patient has dementia, good days and bad days with intermittent confused speech.  She previously was able to ambulate with a wheelchair, transfer, and stand.  In the ED patient had stable vitals overall.  WBC was elevated at 13.3 and lactate of 1.9. Head CT without acute abnormality.  UA not suggestive of infection.  Troponin 24 > 2 3. CT chest abdomen pelvis without contrast-possible mild subcutaneous edema within the buttocks which could reflect cellulitis.  No abscess or osteomyelitis.  Patient was then given IV vancomycin, blood cultures and urine cultures were obtained in given IV fluid bolus.  Patient was then admitted hospital for altered mental status, cellulitis of the buttocks.     Assessment and Plan:  Principal Problem:   Cellulitis of buttock Active Problems:   Acute metabolic encephalopathy   Essential hypertension   Diabetic neuropathy associated with type 2 diabetes mellitus (HCC)   Major neurocognitive disorder (HCC)   Hypomagnesemia   Decubitus ulcer of buttock, stage 2 (HCC)   * Cellulitis of buttock Stage II decubitus ulceration.  Had mild leukocytosis on presentation due to cellulitis of skin involving both buttocks. Leukocytosis of 13.3.  On IV vancomycin and ceftriaxone.  Continue for now.  Follow-up blood cultures urine cultures.  Wound care has seen the patient.  Continue  wound care.  Acute metabolic encephalopathy Reported lethargy and confusion.  Has baseline dementia.  Currently alert awake and communicative.  Encephalopathy likely secondary to infection.  Hold gabapentin at this time.  Continue antibiotic.  Hold diuretic for now.  Essential hypertension Continue Norvasc, metoprolol  Hypomagnesemia Magnesium of 1.5 on presentation.  Was replenished.  Takes magnesium at baseline.  Continue oral magnesium oxide.  Diabetic neuropathy associated with type 2 diabetes mellitus (HCC) Controlled, latest hemoglobin A1c 5.1.  Glucose 194.  Recent hospitalization for hypoglycemia requiring D10 infusion.  Sliding-scale insulin.  Hold metformin from home.   DVT prophylaxis: enoxaparin (LOVENOX) injection 40 mg Start: 08/14/22 1000   Code Status:     Code Status: DNR  Disposition: Skilled nursing facility  Status is: Inpatient  Remains inpatient appropriate because: IV antibiotic, need for rehabilitation, follow cultures,   Family Communication: None at bedside  Consultants:  Wound care  Procedures:  None  Antimicrobials:  Vancomycin and Rocephin  Anti-infectives (From admission, onward)    Start     Dose/Rate Route Frequency Ordered Stop   08/14/22 2200  vancomycin (VANCOREADY) IVPB 750 mg/150 mL        750 mg 150 mL/hr over 60 Minutes Intravenous Every 24 hours 08/13/22 2244     08/14/22 2100  cefTRIAXone (ROCEPHIN) 1 g in sodium chloride 0.9 % 100 mL IVPB        1 g 200 mL/hr over 30 Minutes Intravenous Every 24 hours 08/13/22 2317 08/21/22 2059   08/13/22 2245  vancomycin (VANCOREADY) IVPB 1750 mg/350 mL  1,750 mg 175 mL/hr over 120 Minutes Intravenous  Once 08/13/22 2237 08/14/22 0100   08/13/22 2130  vancomycin (VANCOCIN) IVPB 1000 mg/200 mL premix  Status:  Discontinued        1,000 mg 200 mL/hr over 60 Minutes Intravenous  Once 08/13/22 2115 08/13/22 2237   08/13/22 2130  ceFEPIme (MAXIPIME) 2 g in sodium chloride 0.9 % 100 mL  IVPB        2 g 200 mL/hr over 30 Minutes Intravenous  Once 08/13/22 2115 08/13/22 2240       Subjective: Today, patient was seen and examined at bedside.  Denies any nausea or vomiting, fever, has underlying dementia.  Denies back pain.  Objective: Vitals:   08/13/22 2130 08/13/22 2235 08/13/22 2332 08/14/22 0455  BP: (!) 127/50 (!) 140/82 (!) 139/55 114/76  Pulse: 88 96 94 88  Resp: '17 19 20 14  '$ Temp:  98.2 F (36.8 C) 97.6 F (36.4 C) 98.4 F (36.9 C)  TempSrc:   Oral Oral  SpO2: 100% 96% 97% 94%  Weight:   83.3 kg   Height:   '5\' 4"'$  (1.626 m)     Intake/Output Summary (Last 24 hours) at 08/14/2022 1317 Last data filed at 08/14/2022 0900 Gross per 24 hour  Intake 3549.83 ml  Output --  Net 3549.83 ml   Filed Weights   08/13/22 1741 08/13/22 2332  Weight: 85.7 kg 83.3 kg    Physical Examination: Body mass index is 31.52 kg/m.  General:  not in obvious distress, alert awake, obese, elderly female HENT:   No scleral pallor or icterus noted. Oral mucosa is moist.  Chest:  Clear breath sounds.  Diminished breath sounds bilaterally. No crackles or wheezes.  CVS: S1 &S2 heard. No murmur.  Regular rate and rhythm. Abdomen: Soft, nontender, nondistended.  Bowel sounds are heard.  External urinary catheter in place. Extremities: No cyanosis, clubbing or edema.  Peripheral pulses are palpable. Psych: Alert, awake and oriented, normal mood CNS:  No cranial nerve deficits.  Power equal in all extremities.   Skin:   Extensive skin breakdown of the bilateral buttocks area with mild purulence erythema and induration on presentation.  Data Reviewed:   CBC: Recent Labs  Lab 08/13/22 1942 08/14/22 0248  WBC 13.3* 11.4*  NEUTROABS 9.8*  --   HGB 13.0 10.6*  HCT 39.8 32.4*  MCV 90.5 90.0  PLT 245 161    Basic Metabolic Panel: Recent Labs  Lab 08/13/22 1942 08/14/22 0248  NA 140 136  K 3.7 3.5  CL 102 104  CO2 28 25  GLUCOSE 194* 226*  BUN 25* 21  CREATININE  0.84 0.69  CALCIUM 9.4 8.6*  MG 1.5*  --     Liver Function Tests: Recent Labs  Lab 08/13/22 1942  AST 15  ALT 15  ALKPHOS 98  BILITOT 0.7  PROT 6.5  ALBUMIN 3.0*     Radiology Studies: CT CHEST ABDOMEN PELVIS WO CONTRAST  Result Date: 08/13/2022 CLINICAL DATA:  Sepsis. Altered mental status. Possible buttock/peritoneal abscess. EXAM: CT CHEST, ABDOMEN AND PELVIS WITHOUT CONTRAST TECHNIQUE: Multidetector CT imaging of the chest, abdomen and pelvis was performed following the standard protocol without IV contrast. RADIATION DOSE REDUCTION: This exam was performed according to the departmental dose-optimization program which includes automated exposure control, adjustment of the mA and/or kV according to patient size and/or use of iterative reconstruction technique. COMPARISON:  Abdominopelvic CT 09/06/2009. FINDINGS: CT CHEST FINDINGS Cardiovascular: Extensive coronary artery atherosclerosis with lesser involvement  of the aorta and great vessels. No acute vascular findings on noncontrast imaging. There are probable calcifications of the aortic valve. The heart size is normal. There is no pericardial effusion. Mediastinum/Nodes: There are no enlarged mediastinal, hilar or axillary lymph nodes. Hilar assessment is limited by the lack of intravenous contrast. Small hiatal hernia. The thyroid gland and trachea appear unremarkable. Lungs/Pleura: No pleural effusion or pneumothorax. Mild emphysematous changes and mild biapical scarring. No airspace disease or suspicious pulmonary nodularity. Musculoskeletal/Chest wall: No chest wall mass or suspicious osseous findings. CT ABDOMEN AND PELVIS FINDINGS Hepatobiliary: The liver appears unremarkable as imaged in the noncontrast state. No evidence of gallstones, gallbladder wall thickening or biliary dilatation. Pancreas: Unremarkable. No pancreatic ductal dilatation or surrounding inflammatory changes. Spleen: Normal in size without focal abnormality.  Adrenals/Urinary Tract: Both adrenal glands appear normal. The kidneys appear normal without evidence of urinary tract calculus, suspicious lesion or hydronephrosis. The bladder is moderately distended without wall thickening or surrounding inflammation. Stomach/Bowel: No enteric contrast administered. The stomach appears unremarkable for its degree of distension. No evidence of bowel wall thickening, distention or surrounding inflammatory change. Moderate stool throughout the colon. Vascular/Lymphatic: There are no enlarged abdominal or pelvic lymph nodes. There are mildly prominent inguinal lymph nodes, measuring up to 1.4 cm on the left (image 120/2). Aortic and branch vessel atherosclerosis without evidence of aneurysm. Reproductive: Status post hysterectomy. Low-density left adnexal lesion has enlarged from remote CT, measuring 4.7 x 3.2 cm on image 104/2 and 8 HU (previously 3.1 x 2.0 cm). Other: Mild perirectal soft tissue stranding. No ascites or focal extraluminal fluid collection within the abdomen or pelvis. No free intraperitoneal air. Musculoskeletal: No acute or significant osseous findings. Previous L4-5 fusion. Multilevel spondylosis associated with a thoracolumbar scoliosis. Possible mild subcutaneous edema within the buttocks, but no focal fluid collection, foreign body or soft tissue emphysema identified. IMPRESSION: 1. Possible mild subcutaneous edema within the buttocks which could reflect cellulitis. No evidence of abscess or osteomyelitis. 2. No other evidence of active infection within the chest, abdomen or pelvis. 3. Nonspecific prominent left inguinal lymph node which could be reactive, especially if there is evidence of pelvic inflammation clinically. 4. Slow growth of low-density left adnexal lesion from 2010, suspicious for benign cystic neoplasm. Given the patient's age of 41 years and the slow growth of this lesion, this is of doubtful clinical significance. 5. Distended bladder. 6.  Coronary and aortic atherosclerosis (ICD10-I70.0). Emphysema (ICD10-J43.9). Electronically Signed   By: Richardean Sale M.D.   On: 08/13/2022 18:44   CT Head Wo Contrast  Result Date: 08/13/2022 CLINICAL DATA:  Mental status change, unknown cause EXAM: CT HEAD WITHOUT CONTRAST TECHNIQUE: Contiguous axial images were obtained from the base of the skull through the vertex without intravenous contrast. RADIATION DOSE REDUCTION: This exam was performed according to the departmental dose-optimization program which includes automated exposure control, adjustment of the mA and/or kV according to patient size and/or use of iterative reconstruction technique. COMPARISON:  07/07/2022 FINDINGS: Brain: No evidence of acute infarction, hemorrhage, hydrocephalus, extra-axial collection or mass lesion/mass effect. Patchy low-density changes within the periventricular and subcortical white matter compatible with chronic microvascular ischemic change. Mild diffuse cerebral volume loss. Vascular: Atherosclerotic calcifications involving the large vessels of the skull base. No unexpected hyperdense vessel. Skull: Normal. Negative for fracture or focal lesion. Sinuses/Orbits: No acute finding. Other: None. IMPRESSION: 1. No acute intracranial abnormality. 2. Chronic microvascular ischemic change and cerebral volume loss. Electronically Signed   By: Davina Poke  D.O.   On: 08/13/2022 18:31   DG Chest 1 View  Result Date: 08/13/2022 CLINICAL DATA:  Altered mental status since yesterday. EXAM: CHEST  1 VIEW COMPARISON:  Radiographs 07/09/2022 and 02/29/2020. FINDINGS: 1811 hours. The heart size and mediastinal contours are normal. The lungs are clear. There is no pleural effusion or pneumothorax. No acute osseous findings are identified. Postsurgical changes at the right shoulder with asymmetric glenohumeral degenerative changes on the left. Telemetry leads overlie the chest. IMPRESSION: No evidence of active cardiopulmonary  process. Electronically Signed   By: Richardean Sale M.D.   On: 08/13/2022 18:25      LOS: 1 day    Flora Lipps, MD [facility] Available via Epic secure chat 7am-7pm After these hours, please refer to coverage provider listed on [email or web address] 08/14/2022, 1:17 PM

## 2022-08-15 DIAGNOSIS — G9341 Metabolic encephalopathy: Secondary | ICD-10-CM | POA: Diagnosis not present

## 2022-08-15 DIAGNOSIS — E1142 Type 2 diabetes mellitus with diabetic polyneuropathy: Secondary | ICD-10-CM | POA: Diagnosis not present

## 2022-08-15 DIAGNOSIS — I10 Essential (primary) hypertension: Secondary | ICD-10-CM | POA: Diagnosis not present

## 2022-08-15 DIAGNOSIS — L03317 Cellulitis of buttock: Secondary | ICD-10-CM | POA: Diagnosis not present

## 2022-08-15 LAB — URINE CULTURE: Culture: NO GROWTH

## 2022-08-15 LAB — CBC
HCT: 33.8 % — ABNORMAL LOW (ref 36.0–46.0)
Hemoglobin: 11.1 g/dL — ABNORMAL LOW (ref 12.0–15.0)
MCH: 29.4 pg (ref 26.0–34.0)
MCHC: 32.8 g/dL (ref 30.0–36.0)
MCV: 89.7 fL (ref 80.0–100.0)
Platelets: 207 10*3/uL (ref 150–400)
RBC: 3.77 MIL/uL — ABNORMAL LOW (ref 3.87–5.11)
RDW: 13.1 % (ref 11.5–15.5)
WBC: 8.4 10*3/uL (ref 4.0–10.5)
nRBC: 0 % (ref 0.0–0.2)

## 2022-08-15 LAB — GLUCOSE, CAPILLARY
Glucose-Capillary: 146 mg/dL — ABNORMAL HIGH (ref 70–99)
Glucose-Capillary: 179 mg/dL — ABNORMAL HIGH (ref 70–99)
Glucose-Capillary: 137 mg/dL — ABNORMAL HIGH (ref 70–99)
Glucose-Capillary: 174 mg/dL — ABNORMAL HIGH (ref 70–99)

## 2022-08-15 LAB — BASIC METABOLIC PANEL
Anion gap: 8 (ref 5–15)
BUN: 14 mg/dL (ref 8–23)
CO2: 24 mmol/L (ref 22–32)
Calcium: 8.8 mg/dL — ABNORMAL LOW (ref 8.9–10.3)
Chloride: 108 mmol/L (ref 98–111)
Creatinine, Ser: 0.6 mg/dL (ref 0.44–1.00)
GFR, Estimated: >60 mL/min (ref >60)
Glucose, Bld: 141 mg/dL — ABNORMAL HIGH (ref 70–99)
Potassium: 3.2 mmol/L — ABNORMAL LOW (ref 3.5–5.1)
Sodium: 140 mmol/L (ref 135–145)

## 2022-08-15 LAB — MAGNESIUM: Magnesium: 1.6 mg/dL — ABNORMAL LOW (ref 1.7–2.4)

## 2022-08-15 MED ORDER — MAGNESIUM SULFATE 2 GM/50ML IV SOLN
2.0000 g | Freq: Once | INTRAVENOUS | Status: AC
  Administered 2022-08-15: 2 g via INTRAVENOUS
  Filled 2022-08-15: qty 50

## 2022-08-15 MED ORDER — POTASSIUM CHLORIDE CRYS ER 20 MEQ PO TBCR
40.0000 meq | EXTENDED_RELEASE_TABLET | Freq: Two times a day (BID) | ORAL | Status: AC
  Administered 2022-08-15 (×2): 40 meq via ORAL
  Filled 2022-08-15 (×2): qty 2

## 2022-08-15 NOTE — Plan of Care (Signed)
  Problem: Acute Rehab PT Goals(only PT should resolve) Goal: Pt Will Go Supine/Side To Sit Outcome: Progressing Flowsheets (Taken 08/15/2022 1442) Pt will go Supine/Side to Sit: with minimal assist Goal: Patient Will Transfer Sit To/From Stand Outcome: Progressing Flowsheets (Taken 08/15/2022 1442) Patient will transfer sit to/from stand: with moderate assist Goal: Pt Will Transfer Bed To Chair/Chair To Bed Outcome: Progressing Flowsheets (Taken 08/15/2022 1442) Pt will Transfer Bed to Chair/Chair to Bed: with mod assist Goal: Pt Will Perform Standing Balance Or Pre-Gait Outcome: Progressing Flowsheets (Taken 08/15/2022 1442) Pt will perform standing balance or pre-gait: with moderate assist   2:42 PM, 08/15/22 Lonell Grandchild, MPT Physical Therapist with [facility] [phone] office [phone] mobile phone

## 2022-08-15 NOTE — TOC Progression Note (Signed)
Transition of Care [facility]) - Progression Note    [name] Details  Name: [name] MRN: [id number] Date of Birth: Sep 02, 1929  Transition [facility]) CM/SW Contact  Ihor Gully, LCSW Phone Number: 08/15/2022, 3:51 PM  Clinical Narrative:    Discussed d/c plan with daughter, Waynetta Sandy. [name] and [name]'s spouse have been to [place] and [place] to discuss ALF as a long term option. Spouse will likely admit with her. [name] recently released from [facility] and has used 16 rehab days.  Discussed with Butch Penny having ALFs to come and assess [name] for appropriateness.  Discussed [name] going to SNF as private pay if she is not ALF level of care.  Discussed that [name] would have to be agreeable to placement. Butch Penny indicated that [name] may be resistant to placement.     Expected Discharge Plan: [place] Barriers to Discharge: Continued Medical Work up  Expected Discharge Plan and Services Expected Discharge Plan: [place] arrangements for the past 2 months: Single Family Home                                       Social Determinants of Health (SDOH) Interventions    Readmission Risk Interventions     No data to display

## 2022-08-15 NOTE — Progress Notes (Signed)
[facility]                                                                               [name], is a 88 y.o. female, DOB - 08-04-1929, DGL:[id number] Admit date - 08/13/2022    Outpatient Primary MD for the patient is Sharilyn Sites, MD  LOS - 2  days    Brief summary   [name] is a 88 y.o. female with past medical history of diabetes, hypertension, stroke, overactive bladder presented to hospital with confusion and lethargy.  He lives with his spouse who is 14 years old.  Of note patient was recently admitted hospital. Between 7/17 to 7/19 for UTI.  Patient was then discharged to pain center.  She was at pain center for a week but family noticed increased confusion and disorientation and was playing with feces.  At baseline patient has dementia, good days and bad days with intermittent confused speech.  CT of the chest abdomen pelvis without contrast shows possible mild/cutaneous edema within the buttocks which could reflect cellulitis.  No abscess or osteomyelitis.  She was started on IV vancomycin.   Assessment & Plan    Assessment and Plan: * Cellulitis of buttock Cellulitis of skin involving both buttocks. Improving.  CT chest abdomen and pelvis without contrast shows mild subcutaneous edema within the buttocks which could reflect cellulitis, no evidence of abscess or osteomyelitis. -Continue IV vancomycin and ceftriaxone for another 48 hours.  -Follow-up blood and urine cultures -Wound care consulted and recommendations given.   Acute metabolic encephalopathy Reports of lethargy, confused speech, playing with feces On admission,  likely secondary to cellulitis.  Baseline dementia, with intermittent episodes of confused speech.  CT without acute abnormality.  Chest x-ray UA not suggestive of infection. - Antibiotics and hydration S- she is alert today and answering all questions appropriately, oriented to person and place only.  No confusion  today.   Essential hypertension BP parameters are optimal.   Hypomagnesemia Replaced  Diabetic neuropathy associated with type 2 diabetes mellitus (HCC) Controlled, A1c is 5.1 CBG (last 3)  Recent Labs    08/14/22 2142 08/15/22 0738 08/15/22 1109  GLUCAP 126* 146* 179*   CBGs are well controlled.    Hypokalemia Replaced  Therapy evaluations are pending for placement.     Estimated body mass index is 31.52 kg/m as calculated from the following:   Height as of this encounter: '5\' 4"'$  (1.626 m).   Weight as of this encounter: 83.3 kg.  Code Status: DNR DVT Prophylaxis:  enoxaparin (LOVENOX) injection 40 mg Start: 08/14/22 1000   Level of Care: Level of care: Telemetry Family Communication: Updated patient's family at bedside  Disposition Plan:     Remains inpatient appropriate: IV antibiotics  Procedures:  None.   Consultants:   Wound care consult.   Antimicrobials:   Anti-infectives (From admission, onward)    Start     Dose/Rate Route Frequency Ordered Stop   08/14/22 2200  vancomycin (VANCOREADY) IVPB 750 mg/150 mL        750 mg 150 mL/hr over 60 Minutes Intravenous Every 24 hours 08/13/22 2244     08/14/22 2100  cefTRIAXone (ROCEPHIN) 1 g in sodium chloride 0.9 % 100 mL IVPB        1 g 200 mL/hr over 30 Minutes Intravenous Every 24 hours 08/13/22 2317 08/21/22 2059   08/13/22 2245  vancomycin (VANCOREADY) IVPB 1750 mg/350 mL        1,750 mg 175 mL/hr over 120 Minutes Intravenous  Once 08/13/22 2237 08/14/22 0100   08/13/22 2130  vancomycin (VANCOCIN) IVPB 1000 mg/200 mL premix  Status:  Discontinued        1,000 mg 200 mL/hr over 60 Minutes Intravenous  Once 08/13/22 2115 08/13/22 2237   08/13/22 2130  ceFEPIme (MAXIPIME) 2 g in sodium chloride 0.9 % 100 mL IVPB        2 g 200 mL/hr over 30 Minutes Intravenous  Once 08/13/22 2115 08/13/22 2240        Medications  Scheduled Meds:  amLODipine  5 mg Oral Daily   aspirin  325 mg Oral Daily    enoxaparin (LOVENOX) injection  40 mg Subcutaneous Q24H   insulin aspart  0-5 Units Subcutaneous QHS   insulin aspart  0-9 Units Subcutaneous TID WC   leptospermum manuka honey  1 Application Topical Daily   magnesium oxide  400 mg Oral BID   melatonin  6 mg Oral QHS   metoprolol succinate  50 mg Oral QHS   potassium chloride  40 mEq Oral BID   Continuous Infusions:  cefTRIAXone (ROCEPHIN)  IV 1 g (08/14/22 2002)   magnesium sulfate bolus IVPB     vancomycin 750 mg (08/14/22 2249)   PRN Meds:.acetaminophen **OR** acetaminophen, ondansetron **OR** ondansetron (ZOFRAN) IV, mouth rinse, polyethylene glycol    Subjective:   [name] was seen and examined today.  Reports pain is Improving.   Objective:   Vitals:   08/14/22 0455 08/14/22 1341 08/14/22 2152 08/15/22 0516  BP: 114/76 111/64 (!) 143/60 (!) 122/45  Pulse: 88 84 79 69  Resp: '14 18 17 16  '$ Temp: 98.4 F (36.9 C) 98.4 F (36.9 C) 98.2 F (36.8 C) 98.8 F (37.1 C)  TempSrc: Oral Oral Oral Oral  SpO2: 94% 97% 94% 95%  Weight:      Height:        Intake/Output Summary (Last 24 hours) at 08/15/2022 1201 Last data filed at 08/15/2022 0900 Gross per 24 hour  Intake 720 ml  Output 1050 ml  Net -330 ml   Filed Weights   08/13/22 1741 08/13/22 2332  Weight: 85.7 kg 83.3 kg     Exam General exam: Appears calm and comfortable  Respiratory system: Clear to auscultation. Respiratory effort normal. Cardiovascular system: S1 & S2 heard, RRR. No JVD,  No pedal edema. Gastrointestinal system: Abdomen is nondistended, soft and nontender. Normal bowel sounds heard. Central nervous system: Alert and oriented to person only.  Extremities: Left Upper extremity swelling, and tenderness improving.  Skin: Moisture associated skin damage over the back with some dermatitis. Psychiatry: mood is appropriate.     Data Reviewed:  I have personally reviewed following labs and imaging studies   CBC Lab Results   Component Value Date   WBC 8.4 08/15/2022   RBC 3.77 (L) 08/15/2022   HGB 11.1 (L) 08/15/2022   HCT 33.8 (L) 08/15/2022   MCV 89.7 08/15/2022   MCH 29.4 08/15/2022   PLT 207 08/15/2022   MCHC 32.8 08/15/2022   RDW 13.1 08/15/2022   LYMPHSABS 1.8 08/13/2022   MONOABS 1.5 (H) 08/13/2022   EOSABS 0.2 08/13/2022  BASOSABS 0.1 41/66/0630     Last metabolic panel Lab Results  Component Value Date   NA 140 08/15/2022   K 3.2 (L) 08/15/2022   CL 108 08/15/2022   CO2 24 08/15/2022   BUN 14 08/15/2022   CREATININE 0.60 08/15/2022   GLUCOSE 141 (H) 08/15/2022   GFRNONAA >60 08/15/2022   GFRAA >60 02/29/2020   CALCIUM 8.8 (L) 08/15/2022   PHOS 3.9 11/17/2018   PROT 6.5 08/13/2022   ALBUMIN 3.0 (L) 08/13/2022   LABGLOB 2.5 09/25/2018   AGRATIO 1.5 09/25/2018   BILITOT 0.7 08/13/2022   ALKPHOS 98 08/13/2022   AST 15 08/13/2022   ALT 15 08/13/2022   ANIONGAP 8 08/15/2022    CBG (last 3)  Recent Labs    08/14/22 2142 08/15/22 0738 08/15/22 1109  GLUCAP 126* 146* 179*      Coagulation Profile: Recent Labs  Lab 08/13/22 1942  INR 1.0     Radiology Studies: CT CHEST ABDOMEN PELVIS WO CONTRAST  Result Date: 08/13/2022 CLINICAL DATA:  Sepsis. Altered mental status. Possible buttock/peritoneal abscess. EXAM: CT CHEST, ABDOMEN AND PELVIS WITHOUT CONTRAST TECHNIQUE: Multidetector CT imaging of the chest, abdomen and pelvis was performed following the standard protocol without IV contrast. RADIATION DOSE REDUCTION: This exam was performed according to the departmental dose-optimization program which includes automated exposure control, adjustment of the mA and/or kV according to patient size and/or use of iterative reconstruction technique. COMPARISON:  Abdominopelvic CT 09/06/2009. FINDINGS: CT CHEST FINDINGS Cardiovascular: Extensive coronary artery atherosclerosis with lesser involvement of the aorta and great vessels. No acute vascular findings on noncontrast imaging.  There are probable calcifications of the aortic valve. The heart size is normal. There is no pericardial effusion. Mediastinum/Nodes: There are no enlarged mediastinal, hilar or axillary lymph nodes. Hilar assessment is limited by the lack of intravenous contrast. Small hiatal hernia. The thyroid gland and trachea appear unremarkable. Lungs/Pleura: No pleural effusion or pneumothorax. Mild emphysematous changes and mild biapical scarring. No airspace disease or suspicious pulmonary nodularity. Musculoskeletal/Chest wall: No chest wall mass or suspicious osseous findings. CT ABDOMEN AND PELVIS FINDINGS Hepatobiliary: The liver appears unremarkable as imaged in the noncontrast state. No evidence of gallstones, gallbladder wall thickening or biliary dilatation. Pancreas: Unremarkable. No pancreatic ductal dilatation or surrounding inflammatory changes. Spleen: Normal in size without focal abnormality. Adrenals/Urinary Tract: Both adrenal glands appear normal. The kidneys appear normal without evidence of urinary tract calculus, suspicious lesion or hydronephrosis. The bladder is moderately distended without wall thickening or surrounding inflammation. Stomach/Bowel: No enteric contrast administered. The stomach appears unremarkable for its degree of distension. No evidence of bowel wall thickening, distention or surrounding inflammatory change. Moderate stool throughout the colon. Vascular/Lymphatic: There are no enlarged abdominal or pelvic lymph nodes. There are mildly prominent inguinal lymph nodes, measuring up to 1.4 cm on the left (image 120/2). Aortic and branch vessel atherosclerosis without evidence of aneurysm. Reproductive: Status post hysterectomy. Low-density left adnexal lesion has enlarged from remote CT, measuring 4.7 x 3.2 cm on image 104/2 and 8 HU (previously 3.1 x 2.0 cm). Other: Mild perirectal soft tissue stranding. No ascites or focal extraluminal fluid collection within the abdomen or pelvis. No  free intraperitoneal air. Musculoskeletal: No acute or significant osseous findings. Previous L4-5 fusion. Multilevel spondylosis associated with a thoracolumbar scoliosis. Possible mild subcutaneous edema within the buttocks, but no focal fluid collection, foreign body or soft tissue emphysema identified. IMPRESSION: 1. Possible mild subcutaneous edema within the buttocks which could reflect cellulitis. No evidence  of abscess or osteomyelitis. 2. No other evidence of active infection within the chest, abdomen or pelvis. 3. Nonspecific prominent left inguinal lymph node which could be reactive, especially if there is evidence of pelvic inflammation clinically. 4. Slow growth of low-density left adnexal lesion from 2010, suspicious for benign cystic neoplasm. Given the patient's age of 43 years and the slow growth of this lesion, this is of doubtful clinical significance. 5. Distended bladder. 6. Coronary and aortic atherosclerosis (ICD10-I70.0). Emphysema (ICD10-J43.9). Electronically Signed   By: Richardean Sale M.D.   On: 08/13/2022 18:44   CT Head Wo Contrast  Result Date: 08/13/2022 CLINICAL DATA:  Mental status change, unknown cause EXAM: CT HEAD WITHOUT CONTRAST TECHNIQUE: Contiguous axial images were obtained from the base of the skull through the vertex without intravenous contrast. RADIATION DOSE REDUCTION: This exam was performed according to the departmental dose-optimization program which includes automated exposure control, adjustment of the mA and/or kV according to patient size and/or use of iterative reconstruction technique. COMPARISON:  07/07/2022 FINDINGS: Brain: No evidence of acute infarction, hemorrhage, hydrocephalus, extra-axial collection or mass lesion/mass effect. Patchy low-density changes within the periventricular and subcortical white matter compatible with chronic microvascular ischemic change. Mild diffuse cerebral volume loss. Vascular: Atherosclerotic calcifications involving  the large vessels of the skull base. No unexpected hyperdense vessel. Skull: Normal. Negative for fracture or focal lesion. Sinuses/Orbits: No acute finding. Other: None. IMPRESSION: 1. No acute intracranial abnormality. 2. Chronic microvascular ischemic change and cerebral volume loss. Electronically Signed   By: Davina Poke D.O.   On: 08/13/2022 18:31   DG Chest 1 View  Result Date: 08/13/2022 CLINICAL DATA:  Altered mental status since yesterday. EXAM: CHEST  1 VIEW COMPARISON:  Radiographs 07/09/2022 and 02/29/2020. FINDINGS: 1811 hours. The heart size and mediastinal contours are normal. The lungs are clear. There is no pleural effusion or pneumothorax. No acute osseous findings are identified. Postsurgical changes at the right shoulder with asymmetric glenohumeral degenerative changes on the left. Telemetry leads overlie the chest. IMPRESSION: No evidence of active cardiopulmonary process. Electronically Signed   By: Richardean Sale M.D.   On: 08/13/2022 18:25       Hosie Poisson M.D. [facility] 08/15/2022, 12:01 PM  Available via Epic secure chat 7am-7pm After 7 pm, please refer to night coverage provider listed on amion.

## 2022-08-15 NOTE — Evaluation (Signed)
Physical Therapy Evaluation Patient Details Name: [name] MRN: [id number] DOB: 18-Mar-1929 Today's Date: 08/15/2022  History of Present Illness  [name] is a 88 y.o. female with medical history significant for diabetes mellitus, hypertension, stroke, overactive bladder.  Patient was brought to the ED reports of confusion, lethargy.  At the time of my evaluation, patient is awake alert oriented and able to answer questions appropriately.  Patient's daughter Butch Penny, and spouse at bedside.  Spouse is 9 years old, patient lives with spouse.  Patient was recently admitted to the hospital 8/9 to 8/11 for hypoglycemia, and 7/17 to 7/19 for UTI.  She was discharged to [facility].  She left the [facility] center by about a week ago.  Since getting home she has been weak and unable to ambulate, over the past few days, patient has been having confused speech, and family reports she was playing with feces.  At baseline, patient has some dementia, she has good days and bad days with intermittent confused speech.  She previously was able to ambulate with a wheelchair, transfer, and stand.  Since getting home she has not been able to do any of this and has been lying in bed.  Oral intake has been okay, no vomiting no loose stools.  She reports some pain in her buttock area.  No cough no fevers.   Clinical Impression  Patient demonstrates slow labored movement for sitting up at bedside with difficulty moving LLE due to c/o severe left hip pain, required increased time and Mod assist for scooting up to EOB, unable to stand with repeated attempts using RW and hand held assist due to BLE weakness/left hip pain.  Patient put back to bed requiring Mod assist to reposition.  Patient will benefit from continued skilled physical therapy in hospital and recommended venue below to increase strength, balance, endurance for safe ADLs and gait.         Recommendations for follow up therapy are one component of a  multi-disciplinary discharge planning process, led by the attending physician.  Recommendations may be updated based on patient status, additional functional criteria and insurance authorization.  Follow Up Recommendations Skilled nursing-short term rehab (<3 hours/day) Can patient physically be transported by private vehicle: No    Assistance Recommended at Discharge Set up Supervision/Assistance  Patient can return home with the following  A lot of help with bathing/dressing/bathroom;A lot of help with walking and/or transfers;Assistance with cooking/housework;Help with stairs or ramp for entrance    Equipment Recommendations None recommended by PT  Recommendations for Other Services       Functional Status Assessment Patient has had a recent decline in their functional status and/or demonstrates limited ability to make significant improvements in function in a reasonable and predictable amount of time     Precautions / Restrictions Precautions Precautions: Fall Restrictions Weight Bearing Restrictions: No      Mobility  Bed Mobility Overal bed mobility: Needs Assistance Bed Mobility: Supine to Sit, Sit to Supine     Supine to sit: Mod assist Sit to supine: Mod assist   General bed mobility comments: increased time, labored movement    Transfers                        Ambulation/Gait                  Stairs            Wheelchair Mobility    Modified  Rankin (Stroke Patients Only)       Balance Overall balance assessment: Needs assistance Sitting-balance support: Feet supported, No upper extremity supported Sitting balance-Leahy Scale: Fair Sitting balance - Comments: fair/good seated at EOB                                     Pertinent Vitals/Pain Pain Assessment Pain Assessment: Faces Faces Pain Scale: Hurts whole lot Pain Location: left hip Pain Descriptors / Indicators: Sore, Guarding, Grimacing, Sharp Pain  Intervention(s): Limited activity within patient's tolerance, Monitored during session, Repositioned    Home Living Family/patient expects to be discharged to:: Private residence Living Arrangements: Spouse/significant other   Type of Home: House Home Access: Ramped entrance     Alternate Level Stairs-Number of Steps: patient does not go upstairs Home Layout: Two level Home Equipment: Rolling Walker (2 wheels);BSC/3in1;Shower seat;Grab bars - toilet;Grab bars - tub/shower;Wheelchair - manual      Prior Function Prior Level of Function : Needs assist       Physical Assist : ADLs (physical);Mobility (physical) Mobility (physical): Bed mobility;Transfers;Gait;Stairs   Mobility Comments: Uses wheel chair for mobility, 1 person assisted transfers, non-ambulatory ADLs Comments: assisted by family     Hand Dominance   Dominant Hand: Right    Extremity/Trunk Assessment   Upper Extremity Assessment Upper Extremity Assessment: Generalized weakness    Lower Extremity Assessment Lower Extremity Assessment: Generalized weakness    Cervical / Trunk Assessment Cervical / Trunk Assessment: Normal  Communication   Communication: HOH  Cognition Arousal/Alertness: Awake/alert Behavior During Therapy: WFL for tasks assessed/performed Overall Cognitive Status: Within Functional Limits for tasks assessed                                          General Comments      Exercises     Assessment/Plan    PT Assessment Patient needs continued PT services  PT Problem List Decreased strength;Decreased activity tolerance;Decreased balance;Decreased mobility       PT Treatment Interventions DME instruction;Gait training;Functional mobility training;Therapeutic activities;Therapeutic exercise;Balance training;Patient/family education    PT Goals (Current goals can be found in the Care Plan section)  Acute Rehab PT Goals Patient Stated Goal: return home with family to  assist PT Goal Formulation: With patient Time For Goal Achievement: 08/29/22 Potential to Achieve Goals: Fair    Frequency Min 3X/week     Co-evaluation               AM-PAC PT "6 Clicks" Mobility  Outcome Measure Help needed turning from your back to your side while in a flat bed without using bedrails?: A Lot Help needed moving from lying on your back to sitting on the side of a flat bed without using bedrails?: A Lot Help needed moving to and from a bed to a chair (including a wheelchair)?: Total Help needed standing up from a chair using your arms (e.g., wheelchair or bedside chair)?: Total Help needed to walk in hospital room?: Total Help needed climbing 3-5 steps with a railing? : Total 6 Click Score: 8    End of Session   Activity Tolerance: Patient tolerated treatment well;Patient limited by fatigue;Patient limited by pain Patient left: in bed;with call bell/phone within reach Nurse Communication: Mobility status PT Visit Diagnosis: Unsteadiness on feet (R26.81);Other abnormalities of gait and  mobility (R26.89);Muscle weakness (generalized) (M62.81)    Time: 8502-7741 PT Time Calculation (min) (ACUTE ONLY): 16 min   Charges:   PT Evaluation $PT Eval Low Complexity: 1 Low PT Treatments $Therapeutic Activity: 8-22 mins        2:40 PM, 08/15/22 Lonell Grandchild, MPT Physical Therapist with [facility] [phone] office [phone] mobile phone

## 2022-08-16 DIAGNOSIS — E1142 Type 2 diabetes mellitus with diabetic polyneuropathy: Secondary | ICD-10-CM | POA: Diagnosis not present

## 2022-08-16 DIAGNOSIS — G9341 Metabolic encephalopathy: Secondary | ICD-10-CM | POA: Diagnosis not present

## 2022-08-16 DIAGNOSIS — L03317 Cellulitis of buttock: Secondary | ICD-10-CM | POA: Diagnosis not present

## 2022-08-16 DIAGNOSIS — I10 Essential (primary) hypertension: Secondary | ICD-10-CM | POA: Diagnosis not present

## 2022-08-16 LAB — BASIC METABOLIC PANEL
Anion gap: 9 (ref 5–15)
BUN: 13 mg/dL (ref 8–23)
CO2: 23 mmol/L (ref 22–32)
Calcium: 8.7 mg/dL — ABNORMAL LOW (ref 8.9–10.3)
Chloride: 105 mmol/L (ref 98–111)
Creatinine, Ser: 0.6 mg/dL (ref 0.44–1.00)
GFR, Estimated: >60 mL/min (ref >60)
Glucose, Bld: 207 mg/dL — ABNORMAL HIGH (ref 70–99)
Potassium: 4.4 mmol/L (ref 3.5–5.1)
Sodium: 137 mmol/L (ref 135–145)

## 2022-08-16 LAB — GLUCOSE, CAPILLARY
Glucose-Capillary: 182 mg/dL — ABNORMAL HIGH (ref 70–99)
Glucose-Capillary: 157 mg/dL — ABNORMAL HIGH (ref 70–99)
Glucose-Capillary: 149 mg/dL — ABNORMAL HIGH (ref 70–99)
Glucose-Capillary: 191 mg/dL — ABNORMAL HIGH (ref 70–99)

## 2022-08-16 NOTE — Progress Notes (Signed)
[facility]                                                                               [name], is a 88 y.o. female, DOB - Dec 10, 1929, ZWC:[id number] Admit date - 08/13/2022    Outpatient Primary MD for the patient is Sharilyn Sites, MD  LOS - 3  days    Brief summary   [name] is a 88 y.o. female with past medical history of diabetes, hypertension, stroke, overactive bladder presented to hospital with confusion and lethargy.  He lives with his spouse who is 56 years old.  Of note patient was recently admitted hospital. Between 7/17 to 7/19 for UTI.  Patient was then discharged to pain center.  She was at pain center for a week but family noticed increased confusion and disorientation and was playing with feces.  At baseline patient has dementia, good days and bad days with intermittent confused speech.  CT of the chest abdomen pelvis without contrast shows possible mild/cutaneous edema within the buttocks which could reflect cellulitis.  No abscess or osteomyelitis.  She was started on IV vancomycin.   Assessment & Plan    Assessment and Plan: * Cellulitis of buttock Cellulitis of skin involving both buttocks. Improving. But not ready for discharge yet.  CT chest abdomen and pelvis without contrast shows mild subcutaneous edema within the buttocks which could reflect cellulitis, no evidence of abscess or osteomyelitis. -Continue IV vancomycin and ceftriaxone for another 48 hours.  -Follow-up blood and urine cultures. Blood cultures have been negative so far.  -Wound care consulted and recommendations given.   Acute metabolic encephalopathy Reports of lethargy, confused speech, playing with feces On admission,  likely secondary to cellulitis.  Baseline dementia, with intermittent episodes of confused speech.  CT without acute abnormality.  Chest x-ray UA not suggestive of infection. - Antibiotics and hydration She is alert and oriented to place  and person and answering questions appropriately.  No confusion today.   Essential hypertension BP parameters are well controlled.   Hypomagnesemia Replaced  Diabetic neuropathy associated with type 2 diabetes mellitus (HCC) Controlled, A1c is 5.1 CBG (last 3)  Recent Labs    08/15/22 2133 08/16/22 0729 08/16/22 1142  GLUCAP 174* 182* 157*    CBGs are well controlled. Continue with SSI.      Hypokalemia Replaced, repeat levels wnl.    Hypomagnesemia:  Replaced.  Recheck levels tomorrow.   Therapy evaluations are pending for placement.     Estimated body mass index is 31.52 kg/m as calculated from the following:   Height as of this encounter: '5\' 4"'$  (1.626 m).   Weight as of this encounter: 83.3 kg.  Code Status: DNR DVT Prophylaxis:  enoxaparin (LOVENOX) injection 40 mg Start: 08/14/22 1000   Level of Care: Level of care: Med-Surg Family Communication: None at bedside.   Disposition Plan:     Remains inpatient appropriate: IV antibiotics  Procedures:  None.   Consultants:   Wound care consult.   Antimicrobials:   Anti-infectives (From admission, onward)    Start     Dose/Rate Route Frequency Ordered Stop   08/14/22 2200  vancomycin (VANCOREADY)  IVPB 750 mg/150 mL        750 mg 150 mL/hr over 60 Minutes Intravenous Every 24 hours 08/13/22 2244     08/14/22 2100  cefTRIAXone (ROCEPHIN) 1 g in sodium chloride 0.9 % 100 mL IVPB        1 g 200 mL/hr over 30 Minutes Intravenous Every 24 hours 08/13/22 2317 08/21/22 2059   08/13/22 2245  vancomycin (VANCOREADY) IVPB 1750 mg/350 mL        1,750 mg 175 mL/hr over 120 Minutes Intravenous  Once 08/13/22 2237 08/14/22 0100   08/13/22 2130  vancomycin (VANCOCIN) IVPB 1000 mg/200 mL premix  Status:  Discontinued        1,000 mg 200 mL/hr over 60 Minutes Intravenous  Once 08/13/22 2115 08/13/22 2237   08/13/22 2130  ceFEPIme (MAXIPIME) 2 g in sodium chloride 0.9 % 100 mL IVPB        2 g 200 mL/hr over 30  Minutes Intravenous  Once 08/13/22 2115 08/13/22 2240        Medications  Scheduled Meds:  amLODipine  5 mg Oral Daily   aspirin  325 mg Oral Daily   enoxaparin (LOVENOX) injection  40 mg Subcutaneous Q24H   insulin aspart  0-5 Units Subcutaneous QHS   insulin aspart  0-9 Units Subcutaneous TID WC   leptospermum manuka honey  1 Application Topical Daily   magnesium oxide  400 mg Oral BID   melatonin  6 mg Oral QHS   metoprolol succinate  50 mg Oral QHS   Continuous Infusions:  cefTRIAXone (ROCEPHIN)  IV 1 g (08/15/22 1959)   vancomycin 750 mg (08/15/22 2142)   PRN Meds:.acetaminophen **OR** acetaminophen, ondansetron **OR** ondansetron (ZOFRAN) IV, mouth rinse, polyethylene glycol    Subjective:   [name] was seen and examined today.  Wants to know when she can go home.   Objective:   Vitals:   08/15/22 0516 08/15/22 1418 08/15/22 2129 08/16/22 0547  BP: (!) 122/45 (!) 147/59 (!) 158/61 (!) 154/59  Pulse: 69 74 78 71  Resp: '16 17 20   '$ Temp: 98.8 F (37.1 C) 98.6 F (37 C) 98.1 F (36.7 C) 98 F (36.7 C)  TempSrc: Oral  Oral Oral  SpO2: 95% 100% 100% 98%  Weight:      Height:        Intake/Output Summary (Last 24 hours) at 08/16/2022 1154 Last data filed at 08/16/2022 0929 Gross per 24 hour  Intake 720 ml  Output --  Net 720 ml    Filed Weights   08/13/22 1741 08/13/22 2332  Weight: 85.7 kg 83.3 kg     Exam General exam: Appears calm and comfortable  Respiratory system: Clear to auscultation. Respiratory effort normal. Cardiovascular system: S1 & S2 heard, RRR. No JVD, No pedal edema. Gastrointestinal system: Abdomen is nondistended, soft and nontender.bs good.  Central nervous system: Alert and oriented to place and person.  Extremities: Symmetric 5 x 5 power. Skin: erythematous, with some superficial drainage of the skin over the sacral buttocks area.  Psychiatry: . Mood & affect appropriate.      Data Reviewed:  I have personally  reviewed following labs and imaging studies   CBC Lab Results  Component Value Date   WBC 8.4 08/15/2022   RBC 3.77 (L) 08/15/2022   HGB 11.1 (L) 08/15/2022   HCT 33.8 (L) 08/15/2022   MCV 89.7 08/15/2022   MCH 29.4 08/15/2022   PLT 207 08/15/2022   MCHC 32.8 08/15/2022  RDW 13.1 08/15/2022   LYMPHSABS 1.8 08/13/2022   MONOABS 1.5 (H) 08/13/2022   EOSABS 0.2 08/13/2022   BASOSABS 0.1 33/29/5188     Last metabolic panel Lab Results  Component Value Date   NA 137 08/16/2022   K 4.4 08/16/2022   CL 105 08/16/2022   CO2 23 08/16/2022   BUN 13 08/16/2022   CREATININE 0.60 08/16/2022   GLUCOSE 207 (H) 08/16/2022   GFRNONAA >60 08/16/2022   GFRAA >60 02/29/2020   CALCIUM 8.7 (L) 08/16/2022   PHOS 3.9 11/17/2018   PROT 6.5 08/13/2022   ALBUMIN 3.0 (L) 08/13/2022   LABGLOB 2.5 09/25/2018   AGRATIO 1.5 09/25/2018   BILITOT 0.7 08/13/2022   ALKPHOS 98 08/13/2022   AST 15 08/13/2022   ALT 15 08/13/2022   ANIONGAP 9 08/16/2022    CBG (last 3)  Recent Labs    08/15/22 2133 08/16/22 0729 08/16/22 1142  GLUCAP 174* 182* 157*       Coagulation Profile: Recent Labs  Lab 08/13/22 1942  INR 1.0      Radiology Studies: No results found.     Hosie Poisson M.D. [facility] 08/16/2022, 11:54 AM  Available via Epic secure chat 7am-7pm After 7 pm, please refer to night coverage provider listed on amion.

## 2022-08-16 NOTE — Progress Notes (Signed)
Pharmacy Antibiotic Note  [name] is a 88 y.o. female admitted on 08/13/2022 with  buttock cellulitis .  Pharmacy has been consulted for Vancomycin dosing.  Plan: Continue vancomycin 750 mg IV Q 24 hrs. Goal AUC 400-550.  Will f/u renal function, micro data, and pt's clinical condition Vanc levels prn   Height: '5\' 4"'$  (162.6 cm) Weight: 83.3 kg (183 lb 10.3 oz) IBW/kg (Calculated) : 54.7  Temp (24hrs), Avg:98.2 F (36.8 C), Min:98 F (36.7 C), Max:98.6 F (37 C)  Recent Labs  Lab 08/13/22 1941 08/13/22 1942 08/13/22 2146 08/14/22 0248 08/15/22 0536 08/16/22 1016  WBC  --  13.3*  --  11.4* 8.4  --   CREATININE  --  0.84  --  0.69 0.60 0.60  LATICACIDVEN 1.9  --  2.9* 1.4  --   --      Estimated Creatinine Clearance: 48.8 mL/min (by C-G formula based on SCr of 0.6 mg/dL).    Allergies  Allergen Reactions   Codeine Nausea And Vomiting    Antimicrobials this admission: 8/21 Cefepime x 1 8/21 Vanc >>  CTX 8/22 >>  Microbiology results: 8/21 BCx: ngtd 8/21 UCx:  ng  Thank you for allowing pharmacy to be a part of this patient's care.  Margot Ables, PharmD Clinical Pharmacist 08/16/2022 10:58 AM

## 2022-08-17 DIAGNOSIS — G9341 Metabolic encephalopathy: Secondary | ICD-10-CM | POA: Diagnosis not present

## 2022-08-17 DIAGNOSIS — E1142 Type 2 diabetes mellitus with diabetic polyneuropathy: Secondary | ICD-10-CM | POA: Diagnosis not present

## 2022-08-17 DIAGNOSIS — L03317 Cellulitis of buttock: Secondary | ICD-10-CM | POA: Diagnosis not present

## 2022-08-17 DIAGNOSIS — I10 Essential (primary) hypertension: Secondary | ICD-10-CM | POA: Diagnosis not present

## 2022-08-17 LAB — GLUCOSE, CAPILLARY
Glucose-Capillary: 195 mg/dL — ABNORMAL HIGH (ref 70–99)
Glucose-Capillary: 196 mg/dL — ABNORMAL HIGH (ref 70–99)
Glucose-Capillary: 152 mg/dL — ABNORMAL HIGH (ref 70–99)
Glucose-Capillary: 120 mg/dL — ABNORMAL HIGH (ref 70–99)

## 2022-08-17 LAB — CREATININE, SERUM
Creatinine, Ser: 0.57 mg/dL (ref 0.44–1.00)
GFR, Estimated: >60 mL/min (ref >60)

## 2022-08-17 NOTE — Care Management Important Message (Signed)
Important Message  Patient Details  Name: [name] MRN: [id number] Date of Birth: 07/03/1929   Medicare Important Message Given:  Yes     Tommy Medal 08/17/2022, 12:13 PM

## 2022-08-17 NOTE — TOC Progression Note (Addendum)
Transition of Care [facility]) - Progression Note    Patient Details  Name: [name] MRN: [id number] Date of Birth: 1929/08/06  Transition of Care [facility]) CM/SW Contact  Ihor Gully, LCSW Phone Number: 08/17/2022, 2:10 PM  Clinical Narrative:    [place] and [facility] do not have mechanical lifts. [place] to assess patient. Referral sent to [facility]. SNF authorization started.   Expected Discharge Plan: [place] Barriers to Discharge: Continued Medical Work up  Expected Discharge Plan and Services Expected Discharge Plan: [place] arrangements for the past 2 months: Single Family Home                                       Social Determinants of Health (SDOH) Interventions    Readmission Risk Interventions     No data to display

## 2022-08-17 NOTE — NC FL2 (Signed)
[place] LEVEL OF CARE SCREENING TOOL     IDENTIFICATION  Patient Name: [name] Birthdate: February 01, 1929 Sex: female Admission Date (Current Location): 08/13/2022  [facility] and [place] Number:  [employer] and Address:  [place] [address], [place]      Provider Number: [id number]  Attending Physician Name and Address:  Hosie Poisson, MD  Relative Name and Phone Number:       Current Level of Care:   Recommended Level of Care:   Prior Approval Number:    Date Approved/Denied:   PASRR Number: [id number]  Discharge Plan: SNF    Current Diagnoses: Patient Active Problem List   Diagnosis Date Noted   Decubitus ulcer of buttock, stage 2 ([place]) 08/13/2022   Hypomagnesemia 08/03/2022   Hypoglycemia associated with diabetes ([place]) 08/02/2022   Hypokalemia 08/02/2022   Poorly controlled type 2 diabetes mellitus with neuropathy ([place]) 07/19/2022   Diabetic neuropathy associated with type 2 diabetes mellitus ([place]) 07/16/2022   Hypertension associated with type 2 diabetes mellitus ([place]) 07/16/2022   Hyperlipidemia associated with type 2 diabetes mellitus ([place]) 07/16/2022   Chronic insomnia 07/16/2022   Bilateral lower extremity edema 07/16/2022   Chronic constipation 07/16/2022   Aortic atherosclerosis ([place]) 07/16/2022   Major neurocognitive disorder ([place]) 07/16/2022   DJD (degenerative joint disease) of knee 07/14/2022   Acute respiratory failure ([place]) 07/09/2022   UTI (urinary tract infection) 07/09/2022   Late effects of cerebrovascular disease 09/25/2021   Long term (current) use of insulin ([place]) 09/25/2021   Overactive bladder 10/03/2020   TIA (transient ischemic attack) 02/29/2020   DKA, type 2 ([place]) 75/17/0017   Acute metabolic encephalopathy 49/44/9675   Cellulitis of buttock 07/14/2019   Acute encephalopathy 03/24/2019   Hyperlipidemia 02/20/2016   Essential hypertension 10/10/2015   Controlled  type 2 diabetes with neuropathy ([place]) 10/01/2008    Orientation RESPIRATION BLADDER Height & Weight     Self, Situation, Place  Normal Incontinent Weight: 183 lb 10.3 oz (83.3 kg) Height:  '5\' 4"'$  (162.6 cm)  BEHAVIORAL SYMPTOMS/MOOD NEUROLOGICAL BOWEL NUTRITION STATUS      Incontinent Diet (carb modified)  AMBULATORY STATUS COMMUNICATION OF NEEDS Skin   Extensive Assist Verbally Other (Comment) (moisture associated skin damage)                       Personal Care Assistance Level of Assistance  Bathing, Feeding, Dressing Bathing Assistance: Limited assistance Feeding assistance: Independent Dressing Assistance: Limited assistance     Functional Limitations Info  Sight, Speech, Hearing Sight Info: Adequate Hearing Info: Adequate Speech Info: Adequate    SPECIAL CARE FACTORS FREQUENCY  PT (By licensed PT)     PT Frequency: 5x/week              Contractures Contractures Info: Not present    Additional Factors Info  Code Status, Allergies Code Status Info: DNR Allergies Info: Codeine           Current Medications (08/17/2022):  This is the current hospital active medication list Current Facility-Administered Medications  Medication Dose Route Frequency Provider Last Rate Last Admin   acetaminophen (TYLENOL) tablet 650 mg  650 mg Oral Q6H PRN Emokpae, Ejiroghene E, MD   650 mg at 08/14/22 1956   Or   acetaminophen (TYLENOL) suppository 650 mg  650 mg Rectal Q6H PRN Emokpae, Ejiroghene E, MD       amLODipine (NORVASC) tablet 5 mg  5 mg  Oral Daily Emokpae, Ejiroghene E, MD   5 mg at 08/17/22 6063   aspirin tablet 325 mg  325 mg Oral Daily Emokpae, Ejiroghene E, MD   325 mg at 08/17/22 0835   cefTRIAXone (ROCEPHIN) 1 g in sodium chloride 0.9 % 100 mL IVPB  1 g Intravenous Q24H Emokpae, Ejiroghene E, MD 200 mL/hr at 08/16/22 2007 1 g at 08/16/22 2007   enoxaparin (LOVENOX) injection 40 mg  40 mg Subcutaneous Q24H Emokpae, Ejiroghene E, MD   40 mg at 08/17/22 0835    insulin aspart (novoLOG) injection 0-5 Units  0-5 Units Subcutaneous QHS Emokpae, Ejiroghene E, MD       insulin aspart (novoLOG) injection 0-9 Units  0-9 Units Subcutaneous TID WC Emokpae, Ejiroghene E, MD   2 Units at 08/17/22 1309   leptospermum manuka honey (MEDIHONEY) paste 1 Application  1 Application Topical Daily Pokhrel, Laxman, MD   1 Application at 01/60/10 1325   magnesium oxide (MAG-OX) tablet 400 mg  400 mg Oral BID Pokhrel, Laxman, MD   400 mg at 08/17/22 1058   melatonin tablet 6 mg  6 mg Oral QHS Zierle-Ghosh, Asia B, DO   6 mg at 08/16/22 2147   metoprolol succinate (TOPROL-XL) 24 hr tablet 50 mg  50 mg Oral QHS Emokpae, Ejiroghene E, MD   50 mg at 08/16/22 2147   ondansetron (ZOFRAN) tablet 4 mg  4 mg Oral Q6H PRN Emokpae, Ejiroghene E, MD       Or   ondansetron (ZOFRAN) injection 4 mg  4 mg Intravenous Q6H PRN Emokpae, Ejiroghene E, MD       Oral care mouth rinse  15 mL Mouth Rinse PRN Emokpae, Ejiroghene E, MD       polyethylene glycol (MIRALAX / GLYCOLAX) packet 17 g  17 g Oral Daily PRN Emokpae, Ejiroghene E, MD       vancomycin (VANCOREADY) IVPB 750 mg/150 mL  750 mg Intravenous Q24H Hosie Poisson, MD 150 mL/hr at 08/16/22 2256 750 mg at 08/16/22 2256     Discharge Medications: Please see discharge summary for a list of discharge medications.  Relevant Imaging Results:  Relevant Lab Results:   Additional Information SSN [id number]. Per daughter, patient is vaccinated for COVID  Ilia Engelbert, Clydene Pugh, LCSW

## 2022-08-17 NOTE — Progress Notes (Signed)
[facility]                                                                               [name], is a 88 y.o. female, DOB - November 21, 1929, YTK:[id number] Admit date - 08/13/2022    Outpatient Primary MD for the patient is Sharilyn Sites, MD  LOS - 4  days    Brief summary   [name] is a 88 y.o. female with past medical history of diabetes, hypertension, stroke, overactive bladder presented to hospital with confusion and lethargy.  He lives with his spouse who is 35 years old.  Of note patient was recently admitted hospital. Between 7/17 to 7/19 for UTI.  Patient was then discharged to pain center.  She was at pain center for a week but family noticed increased confusion and disorientation and was playing with feces.  At baseline patient has dementia, good days and bad days with intermittent confused speech.  CT of the chest abdomen pelvis without contrast shows possible mild/cutaneous edema within the buttocks which could reflect cellulitis.  No abscess or osteomyelitis.  She was started on IV vancomycin.   Assessment & Plan    Assessment and Plan: * Cellulitis of buttock Cellulitis of skin involving both buttocks. Improving. But not ready for discharge yet.  CT chest abdomen and pelvis without contrast shows mild subcutaneous edema within the buttocks which could reflect cellulitis, no evidence of abscess or osteomyelitis. -Continue IV vancomycin and ceftriaxone for another 48 hours.  -Follow-up blood and urine cultures. Blood cultures have been negative so far.  -Wound care consulted and recommendations given.   Acute metabolic encephalopathy Reports of lethargy, confused speech, playing with feces On admission,  likely secondary to cellulitis.  Baseline dementia, with intermittent episodes of confused speech.  CT without acute abnormality.  Chest x-ray UA not suggestive of infection. - Antibiotics and hydration She is alert and oriented to place  and person and answering questions appropriately.  No confusion today.   Essential hypertension BP parameters are well controlled.   Hypomagnesemia Replaced  Diabetic neuropathy associated with type 2 diabetes mellitus (HCC) Controlled, A1c is 5.1 CBG (last 3)  Recent Labs    08/16/22 2213 08/17/22 0748 08/17/22 1145  GLUCAP 191* 195* 196*    CBGs are well controlled. Continue with SSI.      Hypokalemia Replaced, repeat levels wnl.    Hypomagnesemia:  Replaced.  Recheck levels tomorrow.   Therapy evaluations are pending for placement.     Estimated body mass index is 31.52 kg/m as calculated from the following:   Height as of this encounter: '5\' 4"'$  (1.626 m).   Weight as of this encounter: 83.3 kg.  Code Status: DNR DVT Prophylaxis:  enoxaparin (LOVENOX) injection 40 mg Start: 08/14/22 1000   Level of Care: Level of care: Med-Surg Family Communication: None at bedside.   Disposition Plan:     Remains inpatient appropriate: IV antibiotics  Procedures:  None.   Consultants:   Wound care consult.   Antimicrobials:   Anti-infectives (From admission, onward)    Start     Dose/Rate Route Frequency Ordered Stop   08/14/22 2200  vancomycin (VANCOREADY)  IVPB 750 mg/150 mL        750 mg 150 mL/hr over 60 Minutes Intravenous Every 24 hours 08/13/22 2244 08/19/22 2159   08/14/22 2100  cefTRIAXone (ROCEPHIN) 1 g in sodium chloride 0.9 % 100 mL IVPB        1 g 200 mL/hr over 30 Minutes Intravenous Every 24 hours 08/13/22 2317 08/21/22 2059   08/13/22 2245  vancomycin (VANCOREADY) IVPB 1750 mg/350 mL        1,750 mg 175 mL/hr over 120 Minutes Intravenous  Once 08/13/22 2237 08/14/22 0100   08/13/22 2130  vancomycin (VANCOCIN) IVPB 1000 mg/200 mL premix  Status:  Discontinued        1,000 mg 200 mL/hr over 60 Minutes Intravenous  Once 08/13/22 2115 08/13/22 2237   08/13/22 2130  ceFEPIme (MAXIPIME) 2 g in sodium chloride 0.9 % 100 mL IVPB        2 g 200  mL/hr over 30 Minutes Intravenous  Once 08/13/22 2115 08/13/22 2240        Medications  Scheduled Meds:  amLODipine  5 mg Oral Daily   aspirin  325 mg Oral Daily   enoxaparin (LOVENOX) injection  40 mg Subcutaneous Q24H   insulin aspart  0-5 Units Subcutaneous QHS   insulin aspart  0-9 Units Subcutaneous TID WC   leptospermum manuka honey  1 Application Topical Daily   magnesium oxide  400 mg Oral BID   melatonin  6 mg Oral QHS   metoprolol succinate  50 mg Oral QHS   Continuous Infusions:  cefTRIAXone (ROCEPHIN)  IV 1 g (08/16/22 2007)   vancomycin 750 mg (08/16/22 2256)   PRN Meds:.acetaminophen **OR** acetaminophen, ondansetron **OR** ondansetron (ZOFRAN) IV, mouth rinse, polyethylene glycol    Subjective:   [name] was seen and examined today.  Wants to know when she can go home.   Objective:   Vitals:   08/16/22 0547 08/16/22 1342 08/16/22 2105 08/17/22 0510  BP: (!) 154/59 (!) 136/50 (!) 164/69 (!) 163/77  Pulse: 71 83 86 87  Resp:  '16 18 17  '$ Temp: 98 F (36.7 C) 97.8 F (36.6 C) 98.6 F (37 C) (!) 97.4 F (36.3 C)  TempSrc: Oral Oral Oral Oral  SpO2: 98% 99% 97% 98%  Weight:      Height:        Intake/Output Summary (Last 24 hours) at 08/17/2022 1326 Last data filed at 08/17/2022 0500 Gross per 24 hour  Intake 740 ml  Output 1100 ml  Net -360 ml    Filed Weights   08/13/22 1741 08/13/22 2332  Weight: 85.7 kg 83.3 kg     Exam General exam: Appears calm and comfortable  Respiratory system: Clear to auscultation. Respiratory effort normal. Cardiovascular system: S1 & S2 heard, RRR. No JVD, No pedal edema. Gastrointestinal system: Abdomen is nondistended, soft and nontender.bs good.  Central nervous system: Alert and oriented to place and person.  Extremities: Symmetric 5 x 5 power. Skin: erythematous, with some superficial drainage of the skin over the sacral buttocks area.  Psychiatry: . Mood & affect appropriate.      Data  Reviewed:  I have personally reviewed following labs and imaging studies   CBC Lab Results  Component Value Date   WBC 8.4 08/15/2022   RBC 3.77 (L) 08/15/2022   HGB 11.1 (L) 08/15/2022   HCT 33.8 (L) 08/15/2022   MCV 89.7 08/15/2022   MCH 29.4 08/15/2022   PLT 207 08/15/2022   MCHC  32.8 08/15/2022   RDW 13.1 08/15/2022   LYMPHSABS 1.8 08/13/2022   MONOABS 1.5 (H) 08/13/2022   EOSABS 0.2 08/13/2022   BASOSABS 0.1 12/81/1886     Last metabolic panel Lab Results  Component Value Date   NA 137 08/16/2022   K 4.4 08/16/2022   CL 105 08/16/2022   CO2 23 08/16/2022   BUN 13 08/16/2022   CREATININE 0.57 08/17/2022   GLUCOSE 207 (H) 08/16/2022   GFRNONAA >60 08/17/2022   GFRAA >60 02/29/2020   CALCIUM 8.7 (L) 08/16/2022   PHOS 3.9 11/17/2018   PROT 6.5 08/13/2022   ALBUMIN 3.0 (L) 08/13/2022   LABGLOB 2.5 09/25/2018   AGRATIO 1.5 09/25/2018   BILITOT 0.7 08/13/2022   ALKPHOS 98 08/13/2022   AST 15 08/13/2022   ALT 15 08/13/2022   ANIONGAP 9 08/16/2022    CBG (last 3)  Recent Labs    08/16/22 2213 08/17/22 0748 08/17/22 1145  GLUCAP 191* 195* 196*       Coagulation Profile: Recent Labs  Lab 08/13/22 1942  INR 1.0      Radiology Studies: No results found.     Hosie Poisson M.D. [facility] 08/17/2022, 1:26 PM  Available via Epic secure chat 7am-7pm After 7 pm, please refer to night coverage provider listed on amion.

## 2022-08-18 DIAGNOSIS — G9341 Metabolic encephalopathy: Secondary | ICD-10-CM | POA: Diagnosis not present

## 2022-08-18 DIAGNOSIS — I10 Essential (primary) hypertension: Secondary | ICD-10-CM | POA: Diagnosis not present

## 2022-08-18 DIAGNOSIS — E1142 Type 2 diabetes mellitus with diabetic polyneuropathy: Secondary | ICD-10-CM | POA: Diagnosis not present

## 2022-08-18 DIAGNOSIS — L03317 Cellulitis of buttock: Secondary | ICD-10-CM | POA: Diagnosis not present

## 2022-08-18 LAB — GLUCOSE, CAPILLARY
Glucose-Capillary: 157 mg/dL — ABNORMAL HIGH (ref 70–99)
Glucose-Capillary: 147 mg/dL — ABNORMAL HIGH (ref 70–99)
Glucose-Capillary: 121 mg/dL — ABNORMAL HIGH (ref 70–99)
Glucose-Capillary: 124 mg/dL — ABNORMAL HIGH (ref 70–99)

## 2022-08-18 LAB — CULTURE, BLOOD (ROUTINE X 2)
Culture: NO GROWTH
Culture: NO GROWTH
Special Requests: ADEQUATE
Special Requests: ADEQUATE

## 2022-08-18 LAB — CBC WITH DIFFERENTIAL/PLATELET
Abs Immature Granulocytes: 0.03 10*3/uL (ref 0.00–0.07)
Basophils Absolute: 0 10*3/uL (ref 0.0–0.1)
Basophils Relative: 1 %
Eosinophils Absolute: 0.2 10*3/uL (ref 0.0–0.5)
Eosinophils Relative: 3 %
HCT: 37.2 % (ref 36.0–46.0)
Hemoglobin: 12.3 g/dL (ref 12.0–15.0)
Immature Granulocytes: 0 %
Lymphocytes Relative: 13 %
Lymphs Abs: 0.9 10*3/uL (ref 0.7–4.0)
MCH: 29.6 pg (ref 26.0–34.0)
MCHC: 33.1 g/dL (ref 30.0–36.0)
MCV: 89.4 fL (ref 80.0–100.0)
Monocytes Absolute: 0.7 10*3/uL (ref 0.1–1.0)
Monocytes Relative: 10 %
Neutro Abs: 5 10*3/uL (ref 1.7–7.7)
Neutrophils Relative %: 73 %
Platelets: 305 10*3/uL (ref 150–400)
RBC: 4.16 MIL/uL (ref 3.87–5.11)
RDW: 13.4 % (ref 11.5–15.5)
WBC: 6.8 10*3/uL (ref 4.0–10.5)
nRBC: 0 % (ref 0.0–0.2)

## 2022-08-18 LAB — BASIC METABOLIC PANEL
Anion gap: 9 (ref 5–15)
BUN: 11 mg/dL (ref 8–23)
CO2: 24 mmol/L (ref 22–32)
Calcium: 8.8 mg/dL — ABNORMAL LOW (ref 8.9–10.3)
Chloride: 104 mmol/L (ref 98–111)
Creatinine, Ser: 0.6 mg/dL (ref 0.44–1.00)
GFR, Estimated: >60 mL/min (ref >60)
Glucose, Bld: 155 mg/dL — ABNORMAL HIGH (ref 70–99)
Potassium: 4 mmol/L (ref 3.5–5.1)
Sodium: 137 mmol/L (ref 135–145)

## 2022-08-18 MED ORDER — GUAIFENESIN-DM 100-10 MG/5ML PO SYRP: 5.0000 mL | ORAL_SOLUTION | ORAL | Status: DC | PRN

## 2022-08-18 NOTE — Progress Notes (Signed)
[facility]                                                                               [name], [name] a 88 y.o. female, DOB - December 02, 1929, [id number] Admit date - 08/13/2022    Outpatient Primary MD for the [name] [name] Sharilyn Sites, MD  LOS - 5  days    Brief summary   [name] [name] a 88 y.o. female with past medical history of [name], [name], [name], [name].  He lives with his spouse who [name] 51 years old.  Of note [name] was recently admitted hospital. Between 7/17 to 7/19 for UTI.  [name] was then discharged to pain center.  She was at pain center for a week but family noticed increased confusion and disorientation and was playing with feces.  At baseline [name] has dementia, good days and bad days with intermittent confused speech.  CT of the chest abdomen pelvis without contrast shows possible mild/cutaneous edema within the buttocks which could reflect cellulitis.  No abscess or osteomyelitis.  She was started on IV vancomycin.   Assessment & Plan    Assessment and Plan: * Cellulitis of buttock Cellulitis of skin involving both buttocks. Improving. But not ready for discharge yet.  CT chest abdomen and pelvis without contrast shows mild subcutaneous edema within the buttocks which could reflect cellulitis, no evidence of abscess or osteomyelitis. -completed 5 days of IV antibiotics. Therapy eval recommending  Snf.  -Follow-up blood and urine cultures. Blood cultures have been negative so far.  -Wound care consulted and recommendations given.   Acute metabolic encephalopathy Reports of [name], confused speech, playing with feces On admission,  likely secondary to cellulitis.  Baseline dementia, with intermittent episodes of confused speech.  CT without acute abnormality.  Chest x-ray UA not suggestive of infection. - Antibiotics and hydration - resolved. [name] [name] alert  and oriented to place and person and agreeable to SNF.   Essential [name] BP parameters are optimal. Continue with metoprolol.   Hypomagnesemia Replaced  Diabetic neuropathy associated with type 2 [name] mellitus (HCC) Controlled, A1c [name] 5.1 CBG (last 3)  Recent Labs    08/17/22 2113 08/18/22 0806 08/18/22 1144  GLUCAP 120* 157* 147*    CBGs are well controlled. Continue with SSI. No changes in meds.      Hypokalemia Replaced, repeat levels wnl.    Hypomagnesemia:  Replaced.  Recheck levels tomorrow.   Therapy eval recommending SNF.      Estimated body mass index [name] 31.52 kg/m as calculated from the following:   Height as of this encounter: '5\' 4"'$  (1.626 m).   Weight as of this encounter: 83.3 kg.  Code Status: DNR DVT Prophylaxis:  enoxaparin (LOVENOX) injection 40 mg Start: 08/14/22 1000   Level of Care: Level of care: Med-Surg Family Communication: None at bedside.   Disposition Plan:     Remains inpatient appropriate: SNF.   Procedures:  None.   Consultants:   Wound care consult.   Antimicrobials:   Anti-infectives (From admission, onward)    Start     Dose/Rate Route Frequency Ordered Stop  08/14/22 2200  vancomycin (VANCOREADY) IVPB 750 mg/150 mL        750 mg 150 mL/hr over 60 Minutes Intravenous Every 24 hours 08/13/22 2244 08/19/22 2159   08/14/22 2100  cefTRIAXone (ROCEPHIN) 1 g in sodium chloride 0.9 % 100 mL IVPB        1 g 200 mL/hr over 30 Minutes Intravenous Every 24 hours 08/13/22 2317 08/21/22 2059   08/13/22 2245  vancomycin (VANCOREADY) IVPB 1750 mg/350 mL        1,750 mg 175 mL/hr over 120 Minutes Intravenous  Once 08/13/22 2237 08/14/22 0100   08/13/22 2130  vancomycin (VANCOCIN) IVPB 1000 mg/200 mL premix  Status:  Discontinued        1,000 mg 200 mL/hr over 60 Minutes Intravenous  Once 08/13/22 2115 08/13/22 2237   08/13/22 2130  ceFEPIme (MAXIPIME) 2 g in sodium chloride 0.9 % 100 mL IVPB        2 g 200 mL/hr  over 30 Minutes Intravenous  Once 08/13/22 2115 08/13/22 2240        Medications  Scheduled Meds:  amLODipine  5 mg Oral Daily   aspirin  325 mg Oral Daily   enoxaparin (LOVENOX) injection  40 mg Subcutaneous Q24H   insulin aspart  0-5 Units Subcutaneous QHS   insulin aspart  0-9 Units Subcutaneous TID WC   leptospermum manuka honey  1 Application Topical Daily   magnesium oxide  400 mg Oral BID   melatonin  6 mg Oral QHS   metoprolol succinate  50 mg Oral QHS   Continuous Infusions:  cefTRIAXone (ROCEPHIN)  IV 1 g (08/17/22 2003)   vancomycin 750 mg (08/17/22 2110)   PRN Meds:.acetaminophen **OR** acetaminophen, ondansetron **OR** ondansetron (ZOFRAN) IV, mouth rinse, polyethylene glycol    Subjective:   [name] was seen and examined today.  NO new complaints today.   Objective:   Vitals:   08/17/22 1449 08/17/22 2114 08/18/22 0538 08/18/22 1400  BP: (!) 147/59 (!) 162/68 (!) 155/77 (!) 131/48  Pulse: 89 87 79 83  Resp: '18 18 17 16  '$ Temp: 98 F (36.7 C) 98.5 F (36.9 C) 97.7 F (36.5 C) 98.3 F (36.8 C)  TempSrc: Oral Oral  Oral  SpO2: 95% 96% 95% 98%  Weight:      Height:       No intake or output data in the 24 hours ending 08/18/22 1719  Filed Weights   08/13/22 1741 08/13/22 2332  Weight: 85.7 kg 83.3 kg     Exam General exam: Appears calm and comfortable  Respiratory system: Clear to auscultation. Respiratory effort normal. Cardiovascular system: S1 & S2 heard, RRR.  No pedal edema. Gastrointestinal system: Abdomen [name] nondistended, soft and nontender. Normal bowel sounds heard. Central nervous system: Alert and oriented to person and place.  Extremities: Symmetric 5 x 5 power. Skin: improving cellulitis of the back area.  Psychiatry: Mood & affect appropriate.        Data Reviewed:  I have personally reviewed following labs and imaging studies   CBC Lab Results  Component Value Date   WBC 6.8 08/18/2022   RBC 4.16  08/18/2022   HGB 12.3 08/18/2022   HCT 37.2 08/18/2022   MCV 89.4 08/18/2022   MCH 29.6 08/18/2022   PLT 305 08/18/2022   MCHC 33.1 08/18/2022   RDW 13.4 08/18/2022   LYMPHSABS 0.9 08/18/2022   MONOABS 0.7 08/18/2022   EOSABS 0.2 08/18/2022   BASOSABS 0.0 08/18/2022  Last metabolic panel Lab Results  Component Value Date   NA 137 08/18/2022   K 4.0 08/18/2022   CL 104 08/18/2022   CO2 24 08/18/2022   BUN 11 08/18/2022   CREATININE 0.60 08/18/2022   GLUCOSE 155 (H) 08/18/2022   GFRNONAA >60 08/18/2022   GFRAA >60 02/29/2020   CALCIUM 8.8 (L) 08/18/2022   PHOS 3.9 11/17/2018   PROT 6.5 08/13/2022   ALBUMIN 3.0 (L) 08/13/2022   LABGLOB 2.5 09/25/2018   AGRATIO 1.5 09/25/2018   BILITOT 0.7 08/13/2022   ALKPHOS 98 08/13/2022   AST 15 08/13/2022   ALT 15 08/13/2022   ANIONGAP 9 08/18/2022    CBG (last 3)  Recent Labs    08/17/22 2113 08/18/22 0806 08/18/22 1144  GLUCAP 120* 157* 147*       Coagulation Profile: Recent Labs  Lab 08/13/22 1942  INR 1.0      Radiology Studies: No results found.     Hosie Poisson M.D. [facility] 08/18/2022, 5:19 PM  Available via Epic secure chat 7am-7pm After 7 pm, please refer to night coverage provider listed on amion.

## 2022-08-19 ENCOUNTER — Inpatient Hospital Stay (HOSPITAL_COMMUNITY): Payer: Medicare Other

## 2022-08-19 DIAGNOSIS — E1142 Type 2 diabetes mellitus with diabetic polyneuropathy: Secondary | ICD-10-CM | POA: Diagnosis not present

## 2022-08-19 DIAGNOSIS — I10 Essential (primary) hypertension: Secondary | ICD-10-CM | POA: Diagnosis not present

## 2022-08-19 DIAGNOSIS — G9341 Metabolic encephalopathy: Secondary | ICD-10-CM | POA: Diagnosis not present

## 2022-08-19 DIAGNOSIS — L03317 Cellulitis of buttock: Secondary | ICD-10-CM | POA: Diagnosis not present

## 2022-08-19 LAB — URINALYSIS, ROUTINE W REFLEX MICROSCOPIC
Bacteria, UA: NONE SEEN
Bilirubin Urine: NEGATIVE
Glucose, UA: NEGATIVE mg/dL
Ketones, ur: 20 mg/dL — AB
Leukocytes,Ua: NEGATIVE
Nitrite: NEGATIVE
Protein, ur: NEGATIVE mg/dL
Specific Gravity, Urine: 1.014 (ref 1.005–1.030)
pH: 6 (ref 5.0–8.0)

## 2022-08-19 LAB — GLUCOSE, CAPILLARY
Glucose-Capillary: 170 mg/dL — ABNORMAL HIGH (ref 70–99)
Glucose-Capillary: 158 mg/dL — ABNORMAL HIGH (ref 70–99)
Glucose-Capillary: 104 mg/dL — ABNORMAL HIGH (ref 70–99)
Glucose-Capillary: 111 mg/dL — ABNORMAL HIGH (ref 70–99)

## 2022-08-19 NOTE — Progress Notes (Signed)
[facility]                                                                               [name], is a 88 y.o. female, DOB - [birth year], NIO:[id number] Admit date - 08/13/2022    Outpatient Primary MD for the patient is Sharilyn Sites, MD  LOS - 6  days    Brief summary   [name] is a 88 y.o. female with past medical history of diabetes, hypertension, stroke, overactive bladder presented to hospital with confusion and lethargy.  He lives with his spouse who is 40 years old.  Of note patient was recently admitted hospital. Between 7/17 to 7/19 for UTI.  Patient was then discharged to pain center.  She was at pain center for a week but family noticed increased confusion and disorientation and was playing with feces.  At baseline patient has dementia, good days and bad days with intermittent confused speech.  CT of the chest abdomen pelvis without contrast shows possible mild/cutaneous edema within the buttocks which could reflect cellulitis.  No abscess or osteomyelitis.  She was started on IV vancomycin and IV Rocephin. Patient will have completed the course of IV antibiotics.  Therapy evaluation recommend SNF currently waiting for a bed at SNF.   Assessment & Plan    Assessment and Plan: * Cellulitis of buttock Cellulitis of skin involving both buttocks. Improving. But not ready for discharge yet.  CT chest abdomen and pelvis without contrast shows mild subcutaneous edema within the buttocks which could reflect cellulitis, no evidence of abscess or osteomyelitis. -Completed the course of IV vancomycin and Rocephin. -Follow-up blood and urine cultures. Blood cultures have been negative so far.  -Wound care consulted and recommendations given.   Acute metabolic encephalopathy Reports of lethargy, confused speech, playing with feces On admission,  likely secondary to cellulitis.  Baseline dementia, with intermittent episodes of confused speech.  CT  without acute abnormality.  Chest x-ray UA not suggestive of infection. - Antibiotics and hydration - resolved. Patient is alert and oriented to place and person and agreeable to SNF.  -No new complaints today  Essential hypertension BP parameters are well controlled Continue with Norvasc 5 mg daily and metoprolol 50 mg daily.  Hypomagnesemia Replaced, repeat levels within normal limits  Diabetic neuropathy associated with type 2 diabetes mellitus (HCC) Controlled, A1c is 5.1 CBG (last 3)  Recent Labs    08/18/22 2140 08/19/22 0808 08/19/22 1138  GLUCAP 124* 170* 158*    CBGs are well controlled. Continue with SSI. No changes in meds.      Hypokalemia Replaced, repeat levels wnl.    Hypomagnesemia:  Replaced.    Fever of 101 last night Patient has completed the course of IV vancomycin and Rocephin Blood cultures have been negative. We will get chest x-ray and urine analysis.   Therapy eval recommending SNF.      Estimated body mass index is 31.52 kg/m as calculated from the following:   Height as of this encounter: '5\' 4"'$  (1.626 m).   Weight as of this encounter: 83.3 kg.  Code Status: DNR DVT Prophylaxis:  enoxaparin (LOVENOX) injection 40 mg Start: 08/14/22  1000   Level of Care: Level of care: Med-Surg Family Communication: Family at bedside  Disposition Plan:     Remains inpatient appropriate: SNF.   Procedures:  None.   Consultants:   Wound care consult.   Antimicrobials:   Anti-infectives (From admission, onward)    Start     Dose/Rate Route Frequency Ordered Stop   08/14/22 2200  vancomycin (VANCOREADY) IVPB 750 mg/150 mL        750 mg 150 mL/hr over 60 Minutes Intravenous Every 24 hours 08/13/22 2244 08/19/22 0147   08/14/22 2100  cefTRIAXone (ROCEPHIN) 1 g in sodium chloride 0.9 % 100 mL IVPB        1 g 200 mL/hr over 30 Minutes Intravenous Every 24 hours 08/13/22 2317 08/21/22 2059   08/13/22 2245  vancomycin (VANCOREADY) IVPB 1750  mg/350 mL        1,750 mg 175 mL/hr over 120 Minutes Intravenous  Once 08/13/22 2237 08/14/22 0100   08/13/22 2130  vancomycin (VANCOCIN) IVPB 1000 mg/200 mL premix  Status:  Discontinued        1,000 mg 200 mL/hr over 60 Minutes Intravenous  Once 08/13/22 2115 08/13/22 2237   08/13/22 2130  ceFEPIme (MAXIPIME) 2 g in sodium chloride 0.9 % 100 mL IVPB        2 g 200 mL/hr over 30 Minutes Intravenous  Once 08/13/22 2115 08/13/22 2240        Medications  Scheduled Meds:  amLODipine  5 mg Oral Daily   aspirin  325 mg Oral Daily   enoxaparin (LOVENOX) injection  40 mg Subcutaneous Q24H   insulin aspart  0-5 Units Subcutaneous QHS   insulin aspart  0-9 Units Subcutaneous TID WC   leptospermum manuka honey  1 Application Topical Daily   magnesium oxide  400 mg Oral BID   melatonin  6 mg Oral QHS   metoprolol succinate  50 mg Oral QHS   Continuous Infusions:  cefTRIAXone (ROCEPHIN)  IV Stopped (08/19/22 0015)   PRN Meds:.acetaminophen **OR** acetaminophen, guaiFENesin-dextromethorphan, ondansetron **OR** ondansetron (ZOFRAN) IV, mouth rinse, polyethylene glycol    Subjective:   [name] was seen and examined today.  No new complaints today  Objective:   Vitals:   08/18/22 2153 08/18/22 2215 08/19/22 0611 08/19/22 1321  BP: (!) 134/54  (!) 143/65 (!) 138/54  Pulse: 87  77 78  Resp: '16  20 19  '$ Temp: (!) 101 F (38.3 C) 98.6 F (37 C) 98.3 F (36.8 C) 98.5 F (36.9 C)  TempSrc: Oral  Oral Oral  SpO2: 95%  96% 95%  Weight:      Height:        Intake/Output Summary (Last 24 hours) at 08/19/2022 1414 Last data filed at 08/19/2022 1016 Gross per 24 hour  Intake 750 ml  Output 1800 ml  Net -1050 ml    Filed Weights   08/13/22 1741 08/13/22 2332  Weight: 85.7 kg 83.3 kg     Exam General exam: Appears calm and comfortable  Respiratory system: Clear to auscultation. Respiratory effort normal. Cardiovascular system: S1 & S2 heard, RRR. No JVD, No pedal  edema. Gastrointestinal system: Abdomen is nondistended, soft and nontender.  Normal bowel sounds heard. Central nervous system: Alert and oriented. No focal neurological deficits. Extremities: Symmetric 5 x 5 power. Skin: No rashes, lesions or ulcers Psychiatry: Mood & affect appropriate.        Data Reviewed:  I have personally reviewed following labs and imaging studies  CBC Lab Results  Component Value Date   WBC 6.8 08/18/2022   RBC 4.16 08/18/2022   HGB 12.3 08/18/2022   HCT 37.2 08/18/2022   MCV 89.4 08/18/2022   MCH 29.6 08/18/2022   PLT 305 08/18/2022   MCHC 33.1 08/18/2022   RDW 13.4 08/18/2022   LYMPHSABS 0.9 08/18/2022   MONOABS 0.7 08/18/2022   EOSABS 0.2 08/18/2022   BASOSABS 0.0 87/56/4332     Last metabolic panel Lab Results  Component Value Date   NA 137 08/18/2022   K 4.0 08/18/2022   CL 104 08/18/2022   CO2 24 08/18/2022   BUN 11 08/18/2022   CREATININE 0.60 08/18/2022   GLUCOSE 155 (H) 08/18/2022   GFRNONAA >60 08/18/2022   GFRAA >60 02/29/2020   CALCIUM 8.8 (L) 08/18/2022   PHOS 3.9 11/17/2018   PROT 6.5 08/13/2022   ALBUMIN 3.0 (L) 08/13/2022   LABGLOB 2.5 09/25/2018   AGRATIO 1.5 09/25/2018   BILITOT 0.7 08/13/2022   ALKPHOS 98 08/13/2022   AST 15 08/13/2022   ALT 15 08/13/2022   ANIONGAP 9 08/18/2022    CBG (last 3)  Recent Labs    08/18/22 2140 08/19/22 0808 08/19/22 1138  GLUCAP 124* 170* 158*       Coagulation Profile: Recent Labs  Lab 08/13/22 1942  INR 1.0      Radiology Studies: No results found.     Hosie Poisson M.D. [facility] 08/19/2022, 2:14 PM  Available via Epic secure chat 7am-7pm After 7 pm, please refer to night coverage provider listed on amion.

## 2022-08-20 DIAGNOSIS — M170 Bilateral primary osteoarthritis of knee: Secondary | ICD-10-CM | POA: Diagnosis not present

## 2022-08-20 DIAGNOSIS — R262 Difficulty in walking, not elsewhere classified: Secondary | ICD-10-CM | POA: Diagnosis not present

## 2022-08-20 DIAGNOSIS — M6281 Muscle weakness (generalized): Secondary | ICD-10-CM | POA: Diagnosis not present

## 2022-08-20 DIAGNOSIS — E1140 Type 2 diabetes mellitus with diabetic neuropathy, unspecified: Secondary | ICD-10-CM | POA: Diagnosis not present

## 2022-08-20 DIAGNOSIS — Z9181 History of falling: Secondary | ICD-10-CM | POA: Diagnosis not present

## 2022-08-20 DIAGNOSIS — G9341 Metabolic encephalopathy: Secondary | ICD-10-CM | POA: Diagnosis not present

## 2022-08-20 DIAGNOSIS — F0150 Vascular dementia without behavioral disturbance: Secondary | ICD-10-CM | POA: Diagnosis not present

## 2022-08-20 DIAGNOSIS — M179 Osteoarthritis of knee, unspecified: Secondary | ICD-10-CM | POA: Diagnosis not present

## 2022-08-20 DIAGNOSIS — F0390 Unspecified dementia without behavioral disturbance: Secondary | ICD-10-CM | POA: Diagnosis not present

## 2022-08-20 DIAGNOSIS — E559 Vitamin D deficiency, unspecified: Secondary | ICD-10-CM | POA: Diagnosis not present

## 2022-08-20 DIAGNOSIS — Z741 Need for assistance with personal care: Secondary | ICD-10-CM | POA: Diagnosis not present

## 2022-08-20 DIAGNOSIS — I639 Cerebral infarction, unspecified: Secondary | ICD-10-CM | POA: Diagnosis not present

## 2022-08-20 DIAGNOSIS — R278 Other lack of coordination: Secondary | ICD-10-CM | POA: Diagnosis not present

## 2022-08-20 DIAGNOSIS — K5909 Other constipation: Secondary | ICD-10-CM | POA: Diagnosis not present

## 2022-08-20 DIAGNOSIS — E1159 Type 2 diabetes mellitus with other circulatory complications: Secondary | ICD-10-CM | POA: Diagnosis not present

## 2022-08-20 DIAGNOSIS — R531 Weakness: Secondary | ICD-10-CM

## 2022-08-20 DIAGNOSIS — L03317 Cellulitis of buttock: Secondary | ICD-10-CM | POA: Diagnosis not present

## 2022-08-20 DIAGNOSIS — I700 Atherosclerosis of aorta: Secondary | ICD-10-CM | POA: Diagnosis not present

## 2022-08-20 DIAGNOSIS — I10 Essential (primary) hypertension: Secondary | ICD-10-CM | POA: Diagnosis not present

## 2022-08-20 DIAGNOSIS — R768 Other specified abnormal immunological findings in serum: Secondary | ICD-10-CM | POA: Diagnosis not present

## 2022-08-20 DIAGNOSIS — L89302 Pressure ulcer of unspecified buttock, stage 2: Secondary | ICD-10-CM | POA: Diagnosis not present

## 2022-08-20 DIAGNOSIS — J439 Emphysema, unspecified: Secondary | ICD-10-CM | POA: Diagnosis not present

## 2022-08-20 DIAGNOSIS — E1165 Type 2 diabetes mellitus with hyperglycemia: Secondary | ICD-10-CM | POA: Diagnosis not present

## 2022-08-20 DIAGNOSIS — U071 COVID-19: Secondary | ICD-10-CM | POA: Diagnosis not present

## 2022-08-20 DIAGNOSIS — I6990 Unspecified sequelae of unspecified cerebrovascular disease: Secondary | ICD-10-CM | POA: Diagnosis not present

## 2022-08-20 DIAGNOSIS — I152 Hypertension secondary to endocrine disorders: Secondary | ICD-10-CM | POA: Diagnosis not present

## 2022-08-20 DIAGNOSIS — E1142 Type 2 diabetes mellitus with diabetic polyneuropathy: Secondary | ICD-10-CM | POA: Diagnosis not present

## 2022-08-20 DIAGNOSIS — E785 Hyperlipidemia, unspecified: Secondary | ICD-10-CM | POA: Diagnosis not present

## 2022-08-20 DIAGNOSIS — E1143 Type 2 diabetes mellitus with diabetic autonomic (poly)neuropathy: Secondary | ICD-10-CM | POA: Diagnosis not present

## 2022-08-20 DIAGNOSIS — F02C0 Dementia in other diseases classified elsewhere, severe, without behavioral disturbance, psychotic disturbance, mood disturbance, and anxiety: Secondary | ICD-10-CM | POA: Diagnosis not present

## 2022-08-20 DIAGNOSIS — E1169 Type 2 diabetes mellitus with other specified complication: Secondary | ICD-10-CM | POA: Diagnosis not present

## 2022-08-20 DIAGNOSIS — J9620 Acute and chronic respiratory failure, unspecified whether with hypoxia or hypercapnia: Secondary | ICD-10-CM | POA: Diagnosis not present

## 2022-08-20 DIAGNOSIS — N3281 Overactive bladder: Secondary | ICD-10-CM | POA: Diagnosis not present

## 2022-08-20 LAB — CREATININE, SERUM
Creatinine, Ser: 0.55 mg/dL (ref 0.44–1.00)
GFR, Estimated: >60 mL/min (ref >60)

## 2022-08-20 LAB — GLUCOSE, CAPILLARY
Glucose-Capillary: 117 mg/dL — ABNORMAL HIGH (ref 70–99)
Glucose-Capillary: 205 mg/dL — ABNORMAL HIGH (ref 70–99)
Glucose-Capillary: 223 mg/dL — ABNORMAL HIGH (ref 70–99)
Glucose-Capillary: 116 mg/dL — ABNORMAL HIGH (ref 70–99)

## 2022-08-20 MED ORDER — MEDIHONEY WOUND/BURN DRESSING EX PSTE: 1.0000 | PASTE | Freq: Every day | CUTANEOUS | Status: DC

## 2022-08-20 MED ORDER — GUAIFENESIN-DM 100-10 MG/5ML PO SYRP: 5.0000 mL | ORAL_SOLUTION | ORAL | 0 refills | Status: DC | PRN

## 2022-08-20 MED ORDER — POLYETHYLENE GLYCOL 3350 17 G PO PACK: 17.0000 g | PACK | Freq: Every day | ORAL | 0 refills | Status: DC | PRN

## 2022-08-20 NOTE — Care Management Important Message (Signed)
Important Message  Patient Details  Name: [name] MRN: [id number] Date of Birth: 1929-08-20   Medicare Important Message Given:  Yes     Tommy Medal 08/20/2022, 3:11 PM

## 2022-08-20 NOTE — Progress Notes (Signed)
[facility]                                                                               [name], is a 88 y.o. female, DOB - [birth year], HYI:[id number] Admit date - 08/13/2022    Outpatient Primary MD for the patient is Sharilyn Sites, MD  LOS - 7  days    Brief summary   [name] is a 88 y.o. female with past medical history of diabetes, hypertension, stroke, overactive bladder presented to hospital with confusion and lethargy.  He lives with his spouse who is 57 years old.  Of note patient was recently admitted hospital. Between 7/17 to 7/19 for UTI.  Patient was then discharged to pain center.  She was at pain center for a week but family noticed increased confusion and disorientation and was playing with feces.  At baseline patient has dementia, good days and bad days with intermittent confused speech.  CT of the chest abdomen pelvis without contrast shows possible mild/cutaneous edema within the buttocks which could reflect cellulitis.  No abscess or osteomyelitis.  She was started on IV vancomycin and IV Rocephin. Patient  completed the course of IV antibiotics.  Therapy evaluation recommend SNF currently waiting for a bed at SNF.   Assessment & Plan    Assessment and Plan: * Cellulitis of buttock Cellulitis of skin involving both buttocks. Much improved.  CT chest abdomen and pelvis without contrast shows mild subcutaneous edema within the buttocks which could reflect cellulitis, no evidence of abscess or osteomyelitis. -Completed the course of IV vancomycin and Rocephin. - Blood cultures have been negative so far.  -Wound care consulted and recommendations given.   Acute metabolic encephalopathy Reports of lethargy, confused speech, playing with feces On admission,  likely secondary to cellulitis.  Baseline dementia, with intermittent episodes of confused speech.  CT without acute abnormality.  Chest x-ray UA not suggestive of infection. -  Antibiotics and hydration - resolved. Patient is alert and oriented to place and person and agreeable to SNF.  -No new complaints today.   Essential hypertension BP parameters are optimal.  Continue with Norvasc 5 mg daily and metoprolol 50 mg daily.  Hypomagnesemia Replaced, repeat levels within normal limits  Diabetic neuropathy associated with type 2 diabetes mellitus (HCC) Controlled, A1c is 5.1 CBG (last 3)  Recent Labs    08/19/22 2120 08/20/22 0753 08/20/22 1327  GLUCAP 111* 117* 223*    CBGs are well controlled. Continue with SSI. No changes in meds.      Hypokalemia Replaced, repeat levels wnl.    Hypomagnesemia:  Replaced.    Fever:  Patient has completed the course of IV vancomycin and Rocephin Blood cultures have been negative. CXR is negative. UA is negative.    Therapy eval recommending SNF.      Estimated body mass index is 46.17 kg/m as calculated from the following:   Height as of this encounter: '5\' 4"'$  (1.626 m).   Weight as of this encounter: 122 kg.  Code Status: DNR DVT Prophylaxis:  enoxaparin (LOVENOX) injection 40 mg Start: 08/14/22 1000   Level of Care: Level of care: Med-Surg Family Communication: Family  at bedside  Disposition Plan:     Remains inpatient appropriate: SNF. Pt is medically stable for discharge.   Procedures:  None.   Consultants:   Wound care consult.   Antimicrobials:   Anti-infectives (From admission, onward)    Start     Dose/Rate Route Frequency Ordered Stop   08/14/22 2200  vancomycin (VANCOREADY) IVPB 750 mg/150 mL        750 mg 150 mL/hr over 60 Minutes Intravenous Every 24 hours 08/13/22 2244 08/19/22 0147   08/14/22 2100  cefTRIAXone (ROCEPHIN) 1 g in sodium chloride 0.9 % 100 mL IVPB  Status:  Discontinued        1 g 200 mL/hr over 30 Minutes Intravenous Every 24 hours 08/13/22 2317 08/19/22 1421   08/13/22 2245  vancomycin (VANCOREADY) IVPB 1750 mg/350 mL        1,750 mg 175 mL/hr over  120 Minutes Intravenous  Once 08/13/22 2237 08/14/22 0100   08/13/22 2130  vancomycin (VANCOCIN) IVPB 1000 mg/200 mL premix  Status:  Discontinued        1,000 mg 200 mL/hr over 60 Minutes Intravenous  Once 08/13/22 2115 08/13/22 2237   08/13/22 2130  ceFEPIme (MAXIPIME) 2 g in sodium chloride 0.9 % 100 mL IVPB        2 g 200 mL/hr over 30 Minutes Intravenous  Once 08/13/22 2115 08/13/22 2240        Medications  Scheduled Meds:  amLODipine  5 mg Oral Daily   aspirin  325 mg Oral Daily   enoxaparin (LOVENOX) injection  40 mg Subcutaneous Q24H   insulin aspart  0-5 Units Subcutaneous QHS   insulin aspart  0-9 Units Subcutaneous TID WC   leptospermum manuka honey  1 Application Topical Daily   magnesium oxide  400 mg Oral BID   melatonin  6 mg Oral QHS   metoprolol succinate  50 mg Oral QHS   Continuous Infusions:   PRN Meds:.acetaminophen **OR** acetaminophen, guaiFENesin-dextromethorphan, ondansetron **OR** ondansetron (ZOFRAN) IV, mouth rinse, polyethylene glycol    Subjective:   [name] was seen and examined today.  No new complaints.   Objective:   Vitals:   08/20/22 0532 08/20/22 0734 08/20/22 0850 08/20/22 1440  BP: (!) 187/160  (!) 159/72 (!) 142/71  Pulse: 80   79  Resp:    19  Temp: 97.9 F (36.6 C)   98.2 F (36.8 C)  TempSrc: Oral   Oral  SpO2: 97%   94%  Weight:  122 kg    Height:        Intake/Output Summary (Last 24 hours) at 08/20/2022 1524 Last data filed at 08/20/2022 1500 Gross per 24 hour  Intake 480 ml  Output 800 ml  Net -320 ml    Filed Weights   08/13/22 1741 08/13/22 2332 08/20/22 0734  Weight: 85.7 kg 83.3 kg 122 kg     Exam General exam: Appears calm and comfortable  Respiratory system: Clear to auscultation. Respiratory effort normal. Cardiovascular system: S1 & S2 heard, RRR. No JVD, No pedal edema. Gastrointestinal system: Abdomen is nondistended, soft and nontender. Normal bowel sounds heard. Central nervous  system: Alert and oriented. No focal neurological deficits. Extremities: Symmetric 5 x 5 power. Skin: pressure injury on the back  Psychiatry:  Mood is appropriate.         Data Reviewed:  I have personally reviewed following labs and imaging studies   CBC Lab Results  Component Value Date   WBC 6.8  08/18/2022   RBC 4.16 08/18/2022   HGB 12.3 08/18/2022   HCT 37.2 08/18/2022   MCV 89.4 08/18/2022   MCH 29.6 08/18/2022   PLT 305 08/18/2022   MCHC 33.1 08/18/2022   RDW 13.4 08/18/2022   LYMPHSABS 0.9 08/18/2022   MONOABS 0.7 08/18/2022   EOSABS 0.2 08/18/2022   BASOSABS 0.0 87/57/9728     Last metabolic panel Lab Results  Component Value Date   NA 137 08/18/2022   K 4.0 08/18/2022   CL 104 08/18/2022   CO2 24 08/18/2022   BUN 11 08/18/2022   CREATININE 0.55 08/20/2022   GLUCOSE 155 (H) 08/18/2022   GFRNONAA >60 08/20/2022   GFRAA >60 02/29/2020   CALCIUM 8.8 (L) 08/18/2022   PHOS 3.9 11/17/2018   PROT 6.5 08/13/2022   ALBUMIN 3.0 (L) 08/13/2022   LABGLOB 2.5 09/25/2018   AGRATIO 1.5 09/25/2018   BILITOT 0.7 08/13/2022   ALKPHOS 98 08/13/2022   AST 15 08/13/2022   ALT 15 08/13/2022   ANIONGAP 9 08/18/2022    CBG (last 3)  Recent Labs    08/19/22 2120 08/20/22 0753 08/20/22 1327  GLUCAP 111* 117* 223*       Coagulation Profile: Recent Labs  Lab 08/13/22 1942  INR 1.0      Radiology Studies: DG Chest Port 1 View  Result Date: 08/19/2022 CLINICAL DATA:  Fever, dementia EXAM: PORTABLE CHEST 1 VIEW COMPARISON:  Portable exam 1508 hours compared to 08/13/2022 FINDINGS: Normal heart size, mediastinal contours, and pulmonary vascularity. Atherosclerotic calcification aorta. Lungs clear. No pulmonary infiltrate, pleural effusion, or pneumothorax. Bones demineralized. IMPRESSION: No acute abnormalities. Aortic Atherosclerosis (ICD10-I70.0). Electronically Signed   By: Lavonia Dana M.D.   On: 08/19/2022 15:24       Hosie Poisson M.D. [facility] 08/20/2022, 3:24 PM  Available via Epic secure chat 7am-7pm After 7 pm, please refer to night coverage provider listed on amion.

## 2022-08-20 NOTE — Discharge Summary (Signed)
Physician Discharge Summary   Patient: [name] MRN: [id number] DOB: April 02, 1929  Admit date:     08/13/2022  Discharge date: 08/20/22  Discharge Physician: Hosie Poisson   PCP: Sharilyn Sites, MD   Recommendations at discharge:  Please follow up with PCp in one week.   Discharge Diagnoses: Principal Problem:   Cellulitis of buttock Active Problems:   Acute metabolic encephalopathy   Essential hypertension   Diabetic neuropathy associated with type 2 diabetes mellitus (HCC)   Major neurocognitive disorder (HCC)   Hypomagnesemia   Decubitus ulcer of buttock, stage 2 [facility])    Hospital Course:  [name] is a 88 y.o. female with past medical history of diabetes, hypertension, stroke, overactive bladder presented to hospital with confusion and lethargy.  He lives with his spouse who is 66 years old.  Of note patient was recently admitted hospital. Between 7/17 to 7/19 for UTI.  Patient was then discharged to pain center.  She was at pain center for a week but family noticed increased confusion and disorientation and was playing with feces.  At baseline patient has dementia, good days and bad days with intermittent confused speech.  CT of the chest abdomen pelvis without contrast shows possible mild/cutaneous edema within the buttocks which could reflect cellulitis.  No abscess or osteomyelitis.  She was started on IV vancomycin and IV Rocephin. Patient  completed the course of IV antibiotics.  Therapy evaluation recommend SNF currently waiting for a bed at SNF. Assessment and Plan: Cellulitis of buttock Cellulitis of skin involving both buttocks. Much improved.  CT chest abdomen and pelvis without contrast shows mild subcutaneous edema within the buttocks which could reflect cellulitis, no evidence of abscess or osteomyelitis. -Completed the course of IV vancomycin and Rocephin. - Blood cultures have been negative so far.  -Wound care consulted and recommendations  given.    Acute metabolic encephalopathy Reports of lethargy, confused speech, playing with feces On admission,  likely secondary to cellulitis.  Baseline dementia, with intermittent episodes of confused speech.  CT without acute abnormality.  Chest x-ray UA not suggestive of infection. - Antibiotics and hydration - resolved. Patient is alert and oriented to place and person and agreeable to SNF.  -No new complaints today.    Essential hypertension BP parameters are optimal.  Continue with Norvasc 5 mg daily and metoprolol 50 mg daily.   Hypomagnesemia Replaced, repeat levels within normal limits   Diabetic neuropathy associated with type 2 diabetes mellitus (HCC) Controlled, A1c is 5.1 CBGs are well controlled. No changes in meds.      Hypokalemia Replaced, repeat levels wnl.      Hypomagnesemia:  Replaced.      Fever:  Patient has completed the course of IV vancomycin and Rocephin Blood cultures have been negative. CXR is negative. UA is negative.      Therapy eval recommending SNF.     Body mass index is 46.17 kg/m. Obesity.  Poor prognostic factor.    Consultants: none.  Procedures performed: none.   Disposition: Skilled nursing facility Diet recommendation:  Discharge Diet Orders (From admission, onward)     Start     Ordered   08/20/22 0000  Diet - low sodium heart healthy        08/20/22 1619           Carb modified diet DISCHARGE MEDICATION: Allergies as of 08/20/2022       Reactions   Codeine Nausea And Vomiting  Medication List     STOP taking these medications    furosemide 40 MG tablet Commonly known as: LASIX   solifenacin 5 MG tablet Commonly known as: VESICARE   Trospium Chloride 60 MG Cp24       TAKE these medications    amLODipine 5 MG tablet Commonly known as: NORVASC Take 1 tablet (5 mg total) by mouth daily.   aspirin 325 MG tablet Take 1 tablet (325 mg total) by mouth daily.   blood glucose meter  kit and supplies Kit Dispense based on patient and insurance preference. Use to TEST BLOOD GLUCOSE THREE times daily as directed. DX E11.65   diclofenac Sodium 1 % Gel Commonly known as: Voltaren Arthritis Pain Apply 2 g topically 3 (three) times daily. bilateral knees for osteoarthritis   gabapentin 300 MG capsule Commonly known as: NEURONTIN Take 300 mg three times daily   guaiFENesin-dextromethorphan 100-10 MG/5ML syrup Commonly known as: ROBITUSSIN DM Take 5-10 mLs by mouth every 4 (four) hours as needed for cough.   leptospermum manuka honey Pste paste Apply 1 Application topically daily. Start taking on: August 21, 2022   Magnesium 200 MG Tabs Take 1 tablet (200 mg total) by mouth daily.   metFORMIN 500 MG tablet Commonly known as: GLUCOPHAGE Take 1 tablet (500 mg total) by mouth 2 (two) times daily with a meal.   metoprolol succinate 50 MG 24 hr tablet Commonly known as: TOPROL-XL Take 1 tablet (50 mg total) by mouth at bedtime. Take with or immediately following a meal.   Pen Needles 31G X 8 MM Misc Use as directed to inject insulin once daily   polyethylene glycol 17 g packet Commonly known as: MIRALAX / GLYCOLAX Take 17 g by mouth daily as needed for mild constipation.   simvastatin 20 MG tablet Commonly known as: ZOCOR Take 1 tablet (20 mg total) by mouth every evening.   Vitamin D3 125 MCG (5000 UT) Caps Take 1 capsule (5,000 Units total) by mouth daily.               Discharge Care Instructions  (From admission, onward)           Start     Ordered   08/20/22 0000  Discharge wound care:       Comments: Apply Medihoney to buttocks/sacrum wound Q day or PRN soiling. Cover with abd pads and tape.   08/20/22 1619            Follow-up Information     Sharilyn Sites, MD. Schedule an appointment as soon as possible for a visit in 1 week(s).   Specialty: Family Medicine Contact information: [address] [place]  [address] [phone]                Discharge Exam: Danley Danker Weights   08/13/22 1741 08/13/22 2332 08/20/22 0734  Weight: 85.7 kg 83.3 kg 122 kg   General exam: Appears calm and comfortable  Respiratory system: Clear to auscultation. Respiratory effort normal. Cardiovascular system: S1 & S2 heard, RRR. No JVD, murmurs, rubs, gallops or clicks. No pedal edema. Gastrointestinal system: Abdomen is nondistended, soft and nontender. No organomegaly or masses felt. Normal bowel sounds heard. Central nervous system: Alert and oriented. No focal neurological deficits. Extremities: Symmetric 5 x 5 power. Skin: sacral decubitus ulcer. Psychiatry: Judgement and insight appear normal. Mood & affect appropriate.    Condition at discharge: fair  The results of significant diagnostics from this hospitalization (including imaging, microbiology, ancillary and  laboratory) are listed below for reference.   Imaging Studies: DG Chest Port 1 View  Result Date: 08/19/2022 CLINICAL DATA:  Fever, dementia EXAM: PORTABLE CHEST 1 VIEW COMPARISON:  Portable exam 1508 hours compared to 08/13/2022 FINDINGS: Normal heart size, mediastinal contours, and pulmonary vascularity. Atherosclerotic calcification aorta. Lungs clear. No pulmonary infiltrate, pleural effusion, or pneumothorax. Bones demineralized. IMPRESSION: No acute abnormalities. Aortic Atherosclerosis (ICD10-I70.0). Electronically Signed   By: Lavonia Dana M.D.   On: 08/19/2022 15:24   CT CHEST ABDOMEN PELVIS WO CONTRAST  Result Date: 08/13/2022 CLINICAL DATA:  Sepsis. Altered mental status. Possible buttock/peritoneal abscess. EXAM: CT CHEST, ABDOMEN AND PELVIS WITHOUT CONTRAST TECHNIQUE: Multidetector CT imaging of the chest, abdomen and pelvis was performed following the standard protocol without IV contrast. RADIATION DOSE REDUCTION: This exam was performed according to the departmental dose-optimization program which includes automated exposure  control, adjustment of the mA and/or kV according to patient size and/or use of iterative reconstruction technique. COMPARISON:  Abdominopelvic CT 09/06/2009. FINDINGS: CT CHEST FINDINGS Cardiovascular: Extensive coronary artery atherosclerosis with lesser involvement of the aorta and great vessels. No acute vascular findings on noncontrast imaging. There are probable calcifications of the aortic valve. The heart size is normal. There is no pericardial effusion. Mediastinum/Nodes: There are no enlarged mediastinal, hilar or axillary lymph nodes. Hilar assessment is limited by the lack of intravenous contrast. Small hiatal hernia. The thyroid gland and trachea appear unremarkable. Lungs/Pleura: No pleural effusion or pneumothorax. Mild emphysematous changes and mild biapical scarring. No airspace disease or suspicious pulmonary nodularity. Musculoskeletal/Chest wall: No chest wall mass or suspicious osseous findings. CT ABDOMEN AND PELVIS FINDINGS Hepatobiliary: The liver appears unremarkable as imaged in the noncontrast state. No evidence of gallstones, gallbladder wall thickening or biliary dilatation. Pancreas: Unremarkable. No pancreatic ductal dilatation or surrounding inflammatory changes. Spleen: Normal in size without focal abnormality. Adrenals/Urinary Tract: Both adrenal glands appear normal. The kidneys appear normal without evidence of urinary tract calculus, suspicious lesion or hydronephrosis. The bladder is moderately distended without wall thickening or surrounding inflammation. Stomach/Bowel: No enteric contrast administered. The stomach appears unremarkable for its degree of distension. No evidence of bowel wall thickening, distention or surrounding inflammatory change. Moderate stool throughout the colon. Vascular/Lymphatic: There are no enlarged abdominal or pelvic lymph nodes. There are mildly prominent inguinal lymph nodes, measuring up to 1.4 cm on the left (image 120/2). Aortic and branch  vessel atherosclerosis without evidence of aneurysm. Reproductive: Status post hysterectomy. Low-density left adnexal lesion has enlarged from remote CT, measuring 4.7 x 3.2 cm on image 104/2 and 8 HU (previously 3.1 x 2.0 cm). Other: Mild perirectal soft tissue stranding. No ascites or focal extraluminal fluid collection within the abdomen or pelvis. No free intraperitoneal air. Musculoskeletal: No acute or significant osseous findings. Previous L4-5 fusion. Multilevel spondylosis associated with a thoracolumbar scoliosis. Possible mild subcutaneous edema within the buttocks, but no focal fluid collection, foreign body or soft tissue emphysema identified. IMPRESSION: 1. Possible mild subcutaneous edema within the buttocks which could reflect cellulitis. No evidence of abscess or osteomyelitis. 2. No other evidence of active infection within the chest, abdomen or pelvis. 3. Nonspecific prominent left inguinal lymph node which could be reactive, especially if there is evidence of pelvic inflammation clinically. 4. Slow growth of low-density left adnexal lesion from 2010, suspicious for benign cystic neoplasm. Given the patient's age of 22 years and the slow growth of this lesion, this is of doubtful clinical significance. 5. Distended bladder. 6. Coronary and aortic atherosclerosis (ICD10-I70.0).  Emphysema (ICD10-J43.9). Electronically Signed   By: Richardean Sale M.D.   On: 08/13/2022 18:44   CT Head Wo Contrast  Result Date: 08/13/2022 CLINICAL DATA:  Mental status change, unknown cause EXAM: CT HEAD WITHOUT CONTRAST TECHNIQUE: Contiguous axial images were obtained from the base of the skull through the vertex without intravenous contrast. RADIATION DOSE REDUCTION: This exam was performed according to the departmental dose-optimization program which includes automated exposure control, adjustment of the mA and/or kV according to patient size and/or use of iterative reconstruction technique. COMPARISON:   07/07/2022 FINDINGS: Brain: No evidence of acute infarction, hemorrhage, hydrocephalus, extra-axial collection or mass lesion/mass effect. Patchy low-density changes within the periventricular and subcortical white matter compatible with chronic microvascular ischemic change. Mild diffuse cerebral volume loss. Vascular: Atherosclerotic calcifications involving the large vessels of the skull base. No unexpected hyperdense vessel. Skull: Normal. Negative for fracture or focal lesion. Sinuses/Orbits: No acute finding. Other: None. IMPRESSION: 1. No acute intracranial abnormality. 2. Chronic microvascular ischemic change and cerebral volume loss. Electronically Signed   By: Davina Poke D.O.   On: 08/13/2022 18:31   DG Chest 1 View  Result Date: 08/13/2022 CLINICAL DATA:  Altered mental status since yesterday. EXAM: CHEST  1 VIEW COMPARISON:  Radiographs 07/09/2022 and 02/29/2020. FINDINGS: 1811 hours. The heart size and mediastinal contours are normal. The lungs are clear. There is no pleural effusion or pneumothorax. No acute osseous findings are identified. Postsurgical changes at the right shoulder with asymmetric glenohumeral degenerative changes on the left. Telemetry leads overlie the chest. IMPRESSION: No evidence of active cardiopulmonary process. Electronically Signed   By: Richardean Sale M.D.   On: 08/13/2022 18:25    Microbiology: Results for orders placed or performed during the hospital encounter of 08/13/22  Blood Culture (routine x 2)     Status: None   Collection Time: 08/13/22  7:22 PM   Specimen: BLOOD RIGHT HAND  Result Value Ref Range Status   Specimen Description BLOOD RIGHT HAND  Final   Special Requests   Final    BOTTLES DRAWN AEROBIC AND ANAEROBIC Blood Culture adequate volume   Culture   Final    NO GROWTH 5 DAYS Performed at [facility], [address] [place], [place] [address]    Report Status 08/18/2022 FINAL  Final  Blood Culture (routine x 2)     Status: None    Collection Time: 08/13/22  7:43 PM   Specimen: Right Antecubital; Blood  Result Value Ref Range Status   Specimen Description RIGHT ANTECUBITAL  Final   Special Requests   Final    BOTTLES DRAWN AEROBIC AND ANAEROBIC Blood Culture adequate volume   Culture   Final    NO GROWTH 5 DAYS Performed at [facility], [address], [place], [place] [address]    Report Status 08/18/2022 FINAL  Final  Urine Culture     Status: None   Collection Time: 08/13/22  7:51 PM   Specimen: Urine, Clean Catch  Result Value Ref Range Status   Specimen Description   Final    URINE, CLEAN CATCH Performed at [facility], [address], [place], [place] [address]    Special Requests   Final    NONE Performed at [facility], [address] [place], [place] [address]    Culture   Final    NO GROWTH Performed at [facility], [place] [address] [place],  [address]    Report Status 08/15/2022 FINAL  Final  Labs: CBC: Recent Labs  Lab 08/13/22 1942 08/14/22 0248 08/15/22 0536 08/18/22 0600  WBC 13.3* 11.4* 8.4 6.8  NEUTROABS 9.8*  --   --  5.0  HGB 13.0 10.6* 11.1* 12.3  HCT 39.8 32.4* 33.8* 37.2  MCV 90.5 90.0 89.7 89.4  PLT 245 193 207 892   Basic Metabolic Panel: Recent Labs  Lab 08/13/22 1942 08/14/22 0248 08/15/22 0536 08/16/22 1016 08/17/22 0514 08/18/22 0600 08/20/22 0621  NA 140 136 140 137  --  137  --   K 3.7 3.5 3.2* 4.4  --  4.0  --   CL 102 104 108 105  --  104  --   CO2 '28 25 24 23  ' --  24  --   GLUCOSE 194* 226* 141* 207*  --  155*  --   BUN 25* '21 14 13  ' --  11  --   CREATININE 0.84 0.69 0.60 0.60 0.57 0.60 0.55  CALCIUM 9.4 8.6* 8.8* 8.7*  --  8.8*  --   MG 1.5*  --  1.6*  --   --   --   --    Liver Function Tests: Recent Labs  Lab 08/13/22 1942  AST 15  ALT 15  ALKPHOS 98  BILITOT 0.7  PROT 6.5  ALBUMIN 3.0*   CBG: Recent Labs  Lab 08/19/22 1138 08/19/22 1610 08/19/22 2120 08/20/22 0753 08/20/22 1327  GLUCAP 158* 104* 111* 117*  223*    Discharge time spent: 35 minutes.   Signed: Hosie Poisson, MD [facility] 08/20/2022

## 2022-08-20 NOTE — TOC Transition Note (Signed)
Transition of Care [facility]) - CM/SW Discharge Note   Patient Details  Name: [name] MRN: [id number] Date of Birth: 1929/11/26  [facility] of Care [facility] CM/SW Contact:  Shade Flood, LCSW Phone Number: 08/20/2022, 4:26 PM   Clinical Narrative:     Pt has insurance approval for SNF. Pt has accepted bed offer from [facility] and agreeable to dc there today. Tammy at [facility] states pt can transfer over today.  DC clinical sent electronically. RN to call report. No other TOC needs for dc.  Final next level of care: Skilled Nursing Facility Barriers to Discharge: Barriers Resolved   Patient Goals and CMS Choice Patient states their goals for this hospitalization and ongoing recovery are:: get better CMS Medicare.gov Compare Post Acute Care list provided to:: Patient Choice offered to / list presented to : Patient  Discharge Placement              Patient chooses bed at: [facility] Patient to be transferred to facility by: w/c Name of family member notified: Mr Rappa Patient and family notified of of transfer: 08/20/22  Discharge Plan and Services                                     Social Determinants of Health (SDOH) Interventions     Readmission Risk Interventions     No data to display

## 2022-08-21 ENCOUNTER — Non-Acute Institutional Stay (SKILLED_NURSING_FACILITY): Payer: Medicare Other | Admitting: Adult Health

## 2022-08-21 ENCOUNTER — Other Ambulatory Visit (HOSPITAL_COMMUNITY)
Admission: RE | Admit: 2022-08-21 | Discharge: 2022-08-21 | Disposition: A | Discharge status: Home / Self Care | Payer: Medicare Other | Source: Skilled Nursing Facility | Attending: Internal Medicine | Admitting: Internal Medicine

## 2022-08-21 ENCOUNTER — Encounter: Payer: Self-pay | Admitting: Adult Health

## 2022-08-21 DIAGNOSIS — E1140 Type 2 diabetes mellitus with diabetic neuropathy, unspecified: Secondary | ICD-10-CM

## 2022-08-21 DIAGNOSIS — K5909 Other constipation: Secondary | ICD-10-CM

## 2022-08-21 DIAGNOSIS — I700 Atherosclerosis of aorta: Secondary | ICD-10-CM

## 2022-08-21 DIAGNOSIS — J439 Emphysema, unspecified: Secondary | ICD-10-CM | POA: Diagnosis not present

## 2022-08-21 DIAGNOSIS — M170 Bilateral primary osteoarthritis of knee: Secondary | ICD-10-CM

## 2022-08-21 DIAGNOSIS — R600 Localized edema: Secondary | ICD-10-CM

## 2022-08-21 DIAGNOSIS — E1159 Type 2 diabetes mellitus with other circulatory complications: Secondary | ICD-10-CM

## 2022-08-21 DIAGNOSIS — F0150 Vascular dementia without behavioral disturbance: Secondary | ICD-10-CM | POA: Insufficient documentation

## 2022-08-21 DIAGNOSIS — E1142 Type 2 diabetes mellitus with diabetic polyneuropathy: Secondary | ICD-10-CM

## 2022-08-21 DIAGNOSIS — N3281 Overactive bladder: Secondary | ICD-10-CM

## 2022-08-21 DIAGNOSIS — F0390 Unspecified dementia without behavioral disturbance: Secondary | ICD-10-CM

## 2022-08-21 DIAGNOSIS — L03317 Cellulitis of buttock: Secondary | ICD-10-CM | POA: Diagnosis not present

## 2022-08-21 DIAGNOSIS — E1169 Type 2 diabetes mellitus with other specified complication: Secondary | ICD-10-CM

## 2022-08-21 DIAGNOSIS — E785 Hyperlipidemia, unspecified: Secondary | ICD-10-CM

## 2022-08-21 DIAGNOSIS — I6990 Unspecified sequelae of unspecified cerebrovascular disease: Secondary | ICD-10-CM

## 2022-08-21 DIAGNOSIS — I152 Hypertension secondary to endocrine disorders: Secondary | ICD-10-CM

## 2022-08-21 LAB — BASIC METABOLIC PANEL
Anion gap: 8 (ref 5–15)
BUN: 23 mg/dL (ref 8–23)
CO2: 25 mmol/L (ref 22–32)
Calcium: 8.6 mg/dL — ABNORMAL LOW (ref 8.9–10.3)
Chloride: 103 mmol/L (ref 98–111)
Creatinine, Ser: 0.74 mg/dL (ref 0.44–1.00)
GFR, Estimated: >60 mL/min (ref >60)
Glucose, Bld: 221 mg/dL — ABNORMAL HIGH (ref 70–99)
Potassium: 3.9 mmol/L (ref 3.5–5.1)
Sodium: 136 mmol/L (ref 135–145)

## 2022-08-21 LAB — CBC
HCT: 39.5 % (ref 36.0–46.0)
Hemoglobin: 12.9 g/dL (ref 12.0–15.0)
MCH: 29.1 pg (ref 26.0–34.0)
MCHC: 32.7 g/dL (ref 30.0–36.0)
MCV: 89.2 fL (ref 80.0–100.0)
Platelets: 296 10*3/uL (ref 150–400)
RBC: 4.43 MIL/uL (ref 3.87–5.11)
RDW: 13.2 % (ref 11.5–15.5)
WBC: 9.6 10*3/uL (ref 4.0–10.5)
nRBC: 0 % (ref 0.0–0.2)

## 2022-08-21 LAB — D-DIMER, QUANTITATIVE: D-Dimer, Quant: 1.98 ug/mL-FEU — ABNORMAL HIGH (ref 0.00–0.50)

## 2022-08-21 LAB — C-REACTIVE PROTEIN: CRP: 5.9 mg/dL — ABNORMAL HIGH (ref <1.0)

## 2022-08-21 NOTE — Progress Notes (Signed)
Location:  [facility] Room Number: 124-P Place of Service:  SNF (31)   CODE STATUS: FULL CODE  Allergies  Allergen Reactions   Codeine Nausea And Vomiting    Chief Complaint  Patient presents with   Hospitalization Follow-up    Hospital follow up.    HPI:  She is a 87 year old woman who has been hospitalized from 08-13-22 through 08-20-22. Her medical history includes: diabetes; old stroke; vascular dementia. She presented to the ED with confusion and lethargy. She does live at home with her spouse. With her dementia she does have both good and bad days.  The ct scan revealed cellulitis of her right buttock without abscess or osteomyelitis. She was placed on IV rocephin and vancomycin. She has completed her antibiotics.  Acute metabolic encephalopathy: was reported increased lethargy; confused speech; playing in feces. Most likely due to cellulitis. She is more alert oriented upon discharge from the hospital.  Her goal is to return back home once she completes therapy. She will continue to be followed for her chronic illnesses including:  Controlled type 2 diabetes mellitus with neuropathy:  Hypertension; associated with type 2 diabetes mellitus; Diabetic polyneuropathy associated with type 2 diabetes mellitus:   Past Medical History:  Diagnosis Date   Diabetes mellitus with neuropathy    Diabetic neuropathy (HCC)    Hypertension    Stroke ([place])    Urinary incontinence     Past Surgical History:  Procedure Laterality Date   ABDOMINAL HYSTERECTOMY     APPENDECTOMY     BREAST SURGERY     begnin tumor removed   CATARACT EXTRACTION, BILATERAL      Social History   Socioeconomic History   Marital status: Married    Spouse name: Eurydice Calixto   Number of children: 2   Years of education: Not on file   Highest education level: Not on file  Occupational History   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  Tobacco Use   Smoking status:  Never   Smokeless tobacco: Never  Vaping Use   Vaping Use: Never used  Substance and Sexual Activity   Alcohol use: No   Drug use: No   Sexual activity: Not Currently    Birth control/protection: None  Other Topics Concern   Not on file  Social History Narrative   Not on file   Social Determinants of Health   Financial Resource Strain: Not on file  Food Insecurity: No Food Insecurity (02/08/2019)   Hunger Vital Sign    Worried About Running Out of Food in the Last Year: Never true    [place] in the Last Year: Never true  Transportation Needs: No Transportation Needs (02/08/2019)   PRAPARE - Hydrologist (Medical): No    Lack of Transportation (Non-Medical): No  Physical Activity: Inactive (02/08/2019)   Exercise Vital Sign    Days of Exercise per Week: 0 days    Minutes of Exercise per Session: 0 min  Stress: No Stress Concern Present (02/08/2019)   [place]    Feeling of Stress : Not at all  Social Connections: Unknown (02/08/2019)   Social Connection and Isolation Panel [NHANES]    Frequency of Communication with Friends and Family: More than three times a week    Frequency of Social Gatherings with Friends and Family: More than three times a week    Attends  Religious Services: More than 4 times per year    Active Member of Clubs or Organizations: Not on file    Attends Club or Organization Meetings: Not on file    Marital Status: Married  Intimate Partner Violence: Not At Risk (02/08/2019)   Humiliation, Afraid, Rape, and Kick questionnaire    Fear of Current or Ex-Partner: No    Emotionally Abused: No    Physically Abused: No    Sexually Abused: No   Family History  Problem Relation Age of Onset   CAD Mother    Hypertension Mother    Stroke Father    Heart attack Brother    Cancer Brother       VITAL SIGNS BP (!) 144/68   Pulse 82   Temp 97.6 F (36.4 C)    Resp 20   Ht '5\' 4"'  (1.626 m)   Wt 173 lb 9.6 oz (78.7 kg)   SpO2 95%   BMI 29.80 kg/m   Outpatient Encounter Medications as of 08/21/2022  Medication Sig   amLODipine (NORVASC) 5 MG tablet Take 1 tablet (5 mg total) by mouth daily.   aspirin 325 MG tablet Take 1 tablet (325 mg total) by mouth daily.   Cholecalciferol (VITAMIN D3) 5000 units CAPS Take 1 capsule (5,000 Units total) by mouth daily.   diclofenac Sodium (VOLTAREN ARTHRITIS PAIN) 1 % GEL Apply 2 g topically 3 (three) times daily. bilateral knees for osteoarthritis   gabapentin (NEURONTIN) 300 MG capsule Take 300 mg three times daily   guaiFENesin-dextromethorphan (ROBITUSSIN DM) 100-10 MG/5ML syrup Take 5-10 mLs by mouth every 4 (four) hours as needed for cough.   leptospermum manuka honey (MEDIHONEY) PSTE paste Apply 1 Application topically daily.   magnesium oxide (MAG-OX) 400 (240 Mg) MG tablet Take 200 mg by mouth daily.   metFORMIN (GLUCOPHAGE) 500 MG tablet Take 1 tablet (500 mg total) by mouth 2 (two) times daily with a meal.   metoprolol succinate (TOPROL-XL) 50 MG 24 hr tablet Take 1 tablet (50 mg total) by mouth at bedtime. Take with or immediately following a meal.   polyethylene glycol (MIRALAX / GLYCOLAX) 17 g packet Take 17 g by mouth daily as needed for mild constipation.   simvastatin (ZOCOR) 20 MG tablet Take 1 tablet (20 mg total) by mouth every evening.   blood glucose meter kit and supplies KIT Dispense based on patient and insurance preference. Use to TEST BLOOD GLUCOSE THREE times daily as directed. DX E11.65   Insulin Pen Needle (PEN NEEDLES) 31G X 8 MM MISC Use as directed to inject insulin once daily   [DISCONTINUED] Magnesium 200 MG TABS Take 1 tablet (200 mg total) by mouth daily.   No facility-administered encounter medications on file as of 08/21/2022.     SIGNIFICANT DIAGNOSTIC EXAMS  PREVIOUS    07-09-22: chest x-ray:  The heart size and mediastinal contours are within normal limits. Both  lungs are clear. The visualized skeletal structures are unremarkable.  TODAY:   08-04-22: chest x-ray:  No evidence of active cardiopulmonary process.   08-13-22: ct of head:  1. No acute intracranial abnormality. 2. Chronic microvascular ischemic change and cerebral volume loss.  08-13-22: ct of chest abdomen and pelvis:  1. Possible mild subcutaneous edema within the buttocks which could reflect cellulitis. No evidence of abscess or osteomyelitis. 2. No other evidence of active infection within the chest, abdomen or pelvis. 3. Nonspecific prominent left inguinal lymph node which could be reactive, especially if there is  evidence of pelvic inflammation clinically. 4. Slow growth of low-density left adnexal lesion from 2010, suspicious for benign cystic neoplasm. Given the patient's age of 67  years and the slow growth of this lesion, this is of doubtful clinical significance. 5. Distended bladder. 6. Coronary and aortic atherosclerosis . Emphysema  08-19-22: chest x-ray:  No acute abnormalities. Aortic Atherosclerosis       LABS REVIEWED PREVIOUS   07-09-22: wbc 18.1; hgb 15.6; hct 47.7; mcv 91.4 plt 303; glucose 53; bun 22; creat 0.86; k+ 4.0; na++ 139; ca 9.7; gfr >60; protein 7.5; albumin 3.3; total bili 1.6; hgb a1c 5.1; urine culture: klebsiella pneumoniae  07-11-22: wbc 10.5; hgb 12.4; hct 38.8; mcv 92.4;plt 285; glucose 122; bun 31; creat 0.74; k+ 3.6; na++ 138; ca 8.9; gfr>60 mag 2.3   TODAY  08-13-22: wbc 13.3; hgb 13.0; hct 39.8; mcv 90.5 plt 245; glucose 194; bun 25; creat 0.84; k+ 3.7; na++ 140; ca 9.4; gfr >60 protein 6.5; albumin 3.0; CRP 13.5; urine culture: no growth 08-15-22: wbc 8.4; hgb 11.1; hct 33.8; mcv 89.7 plt 207; glucose 141; bun 14; creat 0.60; k+ 3.2; na++ 140; ca 8.8; gfr >60; mag 1.6 08-18-22: wbc 6.8; hgb 12.3; hct 37.2; mcv 89.4 plt 305; glucose 155; bun 11; creat 0.60; k+ 4.0; na++ 137; ca 88; gfr >60   Review of Systems  Constitutional:  Positive for  malaise/fatigue.       No appetite   Respiratory:  Negative for cough and shortness of breath.   Cardiovascular:  Negative for chest pain, palpitations and leg swelling.  Gastrointestinal:  Negative for abdominal pain, constipation and heartburn.  Musculoskeletal:  Negative for back pain, joint pain and myalgias.  Skin: Negative.   Neurological:  Negative for dizziness.  Psychiatric/Behavioral:  The patient is not nervous/anxious and does not have insomnia.     Physical Exam Constitutional:      General: She is not in acute distress.    Appearance: She is well-developed. She is not diaphoretic.  Neck:     Thyroid: No thyromegaly.  Cardiovascular:     Rate and Rhythm: Normal rate and regular rhythm.     Pulses: Normal pulses.     Heart sounds: Normal heart sounds.  Pulmonary:     Effort: Pulmonary effort is normal. No respiratory distress.     Breath sounds: Normal breath sounds.  Abdominal:     General: Bowel sounds are normal. There is no distension.     Palpations: Abdomen is soft.     Tenderness: There is no abdominal tenderness.  Musculoskeletal:        General: Normal range of motion.     Cervical back: Neck supple.     Right lower leg: No edema.     Left lower leg: No edema.  Lymphadenopathy:     Cervical: No cervical adenopathy.  Skin:    General: Skin is warm and dry.     Comments: Bilateral buttock; left ischium with unstageable wounds present. Without signs of infection present.   Neurological:     Mental Status: She is alert. Mental status is at baseline.  Psychiatric:        Mood and Affect: Mood normal.       ASSESSMENT/ PLAN:  TODAY  Cellulitis, buttocks: has completed antibiotic therapy. Will continue treatment as indicated  2. Controlled type 2 diabetes mellitus with neuropathy: hgb a1c 5.1; has had low cbg readings; insulins have been stopped; will lower metformin to 500 mg daily  3. Hypertension; associated with type 2 diabetes mellitus; b//p  144/68; with her advanced age this reading is appropriate: will continue norvasc 5 mg daily is on asa 325 mg daily toprol xl 50 mg daily   4. Diabetic polyneuropathy associated with type 2 diabetes mellitus: will continue gabapentin 300 mg three times daily   5. Primary osteoarthritis of bilateral knees: will continue voltaren gel 2 gm three times daily to bilateral knees  6. Hyperlipidemia associated with type 2 diabetes mellitus: will continue zocor 20 mg daily   7. Late effect cerebrovascular accident: will continue asa 325 mg daily   8. Bilateral lower extremity edema: no peripheral edema; is off lasix   9. Chronic constipation: will continue miralax daily as needed  10. Aortic atherosclerosis (07-07-22 ct) is on asa and statin  11. Major neurocognitive disorder/vascular dementia without behavioral disturbance: has moderate atrophy and white matter disease: weight is 173 pounds   12. Overactive bladder: is off vesicare.   13. Hypomagnesemia: level is 1.6 will continue mag ox 200 mg daily will recheck level  14. Emphysema: per ct scan: 08-13-22 is presently off medications will monitor       Ok Edwards NP [facility]   call [phone]

## 2022-08-21 NOTE — Consult Note (Addendum)
   [facility] Inpatient Consult   08/21/2022  [name] 04-09-1929 [id number]  [place] [facility] [[facility]] Patient: [employer]  Addendum  - *Remote review [facility] Liaison coverage for [facility] late entry for 08/20/22 1600  Primary Care Provider:  Sharilyn Sites, MD, with Larene Pickett an [facility] provider.   Patient screened for less than 30 days readmission hospitalization with noted for unplanned readmission risk and to assess for potential readmission prevention needs for post hospital transition.  Review of patient's medical record reveals patient is for a [place] level of care.   Plan:  Will alert [facility] PAC RN of transition to a [facility] affiliated facility.  For questions contact:   Natividad Brood, RN BSN [facility] Liaison  [phone] business mobile phone Toll free office [phone]  Fax number: [phone] [email or web address]

## 2022-08-22 ENCOUNTER — Non-Acute Institutional Stay (SKILLED_NURSING_FACILITY): Payer: Medicare Other | Admitting: Adult Health

## 2022-08-22 ENCOUNTER — Other Ambulatory Visit: Payer: Self-pay | Admitting: *Deleted

## 2022-08-22 DIAGNOSIS — U071 COVID-19: Secondary | ICD-10-CM

## 2022-08-22 NOTE — Progress Notes (Signed)
Location:  [place] Room Number: 124 Place of Service:  SNF (31)   CODE STATUS: full   Allergies  Allergen Reactions   Codeine Nausea And Vomiting    Chief Complaint  Patient presents with   Acute Visit    Covid positive     HPI:  She has tested positive for COVID. She is not symptomatic. There are no reports of fever; coughing or body aches.   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  Tobacco Use   Smoking status: Never   Smokeless tobacco: Never  Vaping Use   Vaping Use: Never used  Substance and Sexual Activity   Alcohol use: No   Drug use: No   Sexual activity: Not Currently    Birth control/protection: None  Other Topics Concern   Not on file  Social History Narrative   Not on file   Social Determinants of Health   Financial Resource Strain: Not on file  Food Insecurity: No Food Insecurity (02/08/2019)   Hunger Vital Sign    Worried About Running Out of Food in the Last Year: Never true    [place] in the Last Year: Never true  Transportation Needs: No Transportation Needs (02/08/2019)   PRAPARE - Hydrologist (Medical): No    Lack of Transportation (Non-Medical): No  Physical Activity: Inactive (02/08/2019)   Exercise Vital Sign    Days of Exercise per Week: 0 days    Minutes of Exercise per Session: 0 min  Stress:  No Stress Concern Present (02/08/2019)   [place]    Feeling of Stress : Not at all  Social Connections: Unknown (02/08/2019)   Social Connection and Isolation Panel [NHANES]    Frequency of Communication with Friends and Family: More than three times a week    Frequency of Social Gatherings with Friends and Family: More than three times a week    Attends Religious Services: More than 4 times per year    Active Member of [employer] or Organizations: Not on file    Attends Archivist Meetings: Not on file    Marital Status: Married  Intimate Partner Violence: Not At Risk (02/08/2019)   Humiliation, Afraid, Rape, and Kick questionnaire    Fear of Current or Ex-Partner: No    Emotionally Abused: No    Physically Abused: No    Sexually Abused: No   Family History  Problem Relation Age of Onset   CAD Mother    Hypertension Mother    Stroke Father    Heart attack Brother    Cancer Brother       VITAL SIGNS BP (!) 148/70   Pulse 96   Temp  98.2 F (36.8 C)   Resp 20   Ht $R'5\' 9"'GO$  (1.753 m)   Wt 173 lb 9.6 oz (78.7 kg)   SpO2 96%   BMI 25.64 kg/m   Outpatient Encounter Medications as of 08/22/2022  Medication Sig   amLODipine (NORVASC) 5 MG tablet Take 1 tablet (5 mg total) by mouth daily.   aspirin 325 MG tablet Take 1 tablet (325 mg total) by mouth daily.   blood glucose meter kit and supplies KIT Dispense based on patient and insurance preference. Use to TEST BLOOD GLUCOSE THREE times daily as directed. DX E11.65   Cholecalciferol (VITAMIN D3) 5000 units CAPS Take 1 capsule (5,000 Units total) by mouth daily.   diclofenac Sodium (VOLTAREN ARTHRITIS PAIN) 1 % GEL Apply 2 g topically 3 (three) times daily. bilateral knees for osteoarthritis   gabapentin (NEURONTIN) 300 MG capsule Take 300 mg three times daily   guaiFENesin-dextromethorphan (ROBITUSSIN DM) 100-10 MG/5ML syrup Take 5-10 mLs by mouth every 4  (four) hours as needed for cough.   Insulin Pen Needle (PEN NEEDLES) 31G X 8 MM MISC Use as directed to inject insulin once daily   leptospermum manuka honey (MEDIHONEY) PSTE paste Apply 1 Application topically daily.   magnesium oxide (MAG-OX) 400 (240 Mg) MG tablet Take 200 mg by mouth daily.   metFORMIN (GLUCOPHAGE) 500 MG tablet Take 1 tablet (500 mg total) by mouth  daily with a meal.   metoprolol succinate (TOPROL-XL) 50 MG 24 hr tablet Take 1 tablet (50 mg total) by mouth at bedtime. Take with or immediately following a meal.   polyethylene glycol (MIRALAX / GLYCOLAX) 17 g packet Take 17 g by mouth daily as needed for mild constipation.   simvastatin (ZOCOR) 20 MG tablet Take 1 tablet (20 mg total) by mouth every evening.   No facility-administered encounter medications on file as of 08/22/2022.     SIGNIFICANT DIAGNOSTIC EXAMS  PREVIOUS    07-09-22: chest x-ray:  The heart size and mediastinal contours are within normal limits. Both lungs are clear. The visualized skeletal structures are unremarkable.  08-04-22: chest x-ray:  No evidence of active cardiopulmonary process.   08-13-22: ct of head:  1. No acute intracranial abnormality. 2. Chronic microvascular ischemic change and cerebral volume loss.  08-13-22: ct of chest abdomen and pelvis:  1. Possible mild subcutaneous edema within the buttocks which could reflect cellulitis. No evidence of abscess or osteomyelitis. 2. No other evidence of active infection within the chest, abdomen or pelvis. 3. Nonspecific prominent left inguinal lymph node which could be reactive, especially if there is evidence of pelvic inflammation clinically. 4. Slow growth of low-density left adnexal lesion from 2010, suspicious for benign cystic neoplasm. Given the patient's age of 30  years and the slow growth of this lesion, this is of doubtful clinical significance. 5. Distended bladder. 6. Coronary and aortic atherosclerosis . Emphysema  08-19-22:  chest x-ray:  No acute abnormalities. Aortic Atherosclerosis   NO NEW EXAMS       LABS REVIEWED PREVIOUS   07-09-22: wbc 18.1; hgb 15.6; hct 47.7; mcv 91.4 plt 303; glucose 53; bun 22; creat 0.86; k+ 4.0; na++ 139; ca 9.7; gfr >60; protein 7.5; albumin 3.3; total bili 1.6; hgb a1c 5.1; urine culture: klebsiella pneumoniae  07-11-22: wbc 10.5; hgb 12.4; hct 38.8; mcv 92.4;plt 285; glucose 122; bun 31; creat 0.74; k+ 3.6; na++ 138; ca 8.9; gfr>60 mag 2.3  08-13-22: wbc 13.3; hgb 13.0; hct 39.8; mcv 90.5 plt 245;  glucose 194; bun 25; creat 0.84; k+ 3.7; na++ 140; ca 9.4; gfr >60 protein 6.5; albumin 3.0; CRP 13.5; urine culture: no growth 08-15-22: wbc 8.4; hgb 11.1; hct 33.8; mcv 89.7 plt 207; glucose 141; bun 14; creat 0.60; k+ 3.2; na++ 140; ca 8.8; gfr >60; mag 1.6 08-18-22: wbc 6.8; hgb 12.3; hct 37.2; mcv 89.4 plt 305; glucose 155; bun 11; creat 0.60; k+ 4.0; na++ 137; ca 88; gfr >60   NO NEW LABS.   Review of Systems  Constitutional:  Negative for malaise/fatigue.  Respiratory:  Negative for cough and shortness of breath.   Cardiovascular:  Negative for chest pain, palpitations and leg swelling.  Gastrointestinal:  Negative for abdominal pain, constipation and heartburn.  Musculoskeletal:  Negative for back pain, joint pain and myalgias.  Skin: Negative.   Neurological:  Negative for dizziness.  Psychiatric/Behavioral:  The patient is not nervous/anxious.     Physical Exam Constitutional:      General: She is not in acute distress.    Appearance: She is well-developed. She is not diaphoretic.  Neck:     Thyroid: No thyromegaly.  Cardiovascular:     Rate and Rhythm: Normal rate and regular rhythm.     Pulses: Normal pulses.     Heart sounds: Normal heart sounds.  Pulmonary:     Effort: Pulmonary effort is normal. No respiratory distress.     Breath sounds: Normal breath sounds.  Abdominal:     General: Bowel sounds are normal. There is no distension.     Palpations: Abdomen  is soft.     Tenderness: There is no abdominal tenderness.  Musculoskeletal:        General: Normal range of motion.     Cervical back: Neck supple.     Right lower leg: No edema.     Left lower leg: No edema.  Lymphadenopathy:     Cervical: No cervical adenopathy.  Skin:    General: Skin is warm and dry.     Comments:  Bilateral buttock; left ischium with unstageable wounds present. Without signs of infection present.  Neurological:     Mental Status: She is alert. Mental status is at baseline.  Psychiatric:        Mood and Affect: Mood normal.       ASSESSMENT/ PLAN:  TODAY  SARS Co2 positive:   Will continue antiviral medication Will continue to monitor her status.    Ok Edwards NP [facility]   call [phone]

## 2022-08-22 NOTE — Patient Outreach (Signed)
Mrs. Dowda admitted to [facility] on 08/20/22. Screening for potential [facility] care coordination/care management needs as a benefit of PCP and insurance plan.   Communication sent to SNF SW to make aware writer is following for transition plans and potential [facility] services.    [name], MSN, RN,BSN [place] Acute Care Coordinator [phone] [facility]) [phone]  (Toll free office)

## 2022-08-23 ENCOUNTER — Encounter: Payer: Self-pay | Admitting: Internal Medicine

## 2022-08-23 ENCOUNTER — Non-Acute Institutional Stay (SKILLED_NURSING_FACILITY): Payer: Medicare Other | Admitting: Internal Medicine

## 2022-08-23 ENCOUNTER — Encounter (HOSPITAL_COMMUNITY)
Admission: RE | Admit: 2022-08-23 | Discharge: 2022-08-23 | Disposition: A | Discharge status: Home / Self Care | Payer: Medicare Other | Source: Skilled Nursing Facility | Attending: Adult Health | Admitting: Adult Health

## 2022-08-23 DIAGNOSIS — R768 Other specified abnormal immunological findings in serum: Secondary | ICD-10-CM | POA: Diagnosis not present

## 2022-08-23 DIAGNOSIS — F0150 Vascular dementia without behavioral disturbance: Secondary | ICD-10-CM | POA: Diagnosis not present

## 2022-08-23 DIAGNOSIS — L03317 Cellulitis of buttock: Secondary | ICD-10-CM

## 2022-08-23 LAB — MAGNESIUM: Magnesium: 1.7 mg/dL (ref 1.7–2.4)

## 2022-08-23 NOTE — Patient Instructions (Signed)
See assessment and plan under each diagnosis in the problem list and acutely for this visit 

## 2022-08-23 NOTE — Assessment & Plan Note (Addendum)
Oral antiviral and low-dose Eliquis initiated.  Oral steroids held as C-reactive protein has decreased by over 50% and because of her significant hyperglycemia.

## 2022-08-23 NOTE — Progress Notes (Signed)
NURSING HOME LOCATION:  [facility] ROOM NUMBER:  124 P  CODE STATUS:  DNR  PCP:  Sharilyn Sites MD  This is a comprehensive admission note to this SNFperformed on this date less than 30 days from date of admission. Included are preadmission medical/surgical history; reconciled medication list; family history; social history and comprehensive review of systems.  Corrections and additions to the records were documented. Comprehensive physical exam was also performed. Additionally a clinical summary was entered for each active diagnosis pertinent to this admission in the Problem List to enhance continuity of care.  HPI: She was hospitalized 8/21 - 08/20/2022 presenting with confusion and lethargy.  This actually represents the third admission in the last 2 months. She was hospitalized 8/9 - 08/03/2022 for altered mental status.  EMS documented CBG in the 20s for which she received glucagon prior to transport to the ED.  In the ED D10 drip was initiated to correct the severe hypoglycemia.  On 7/17 A1c had been 5.1% suggesting discordance as a daughter reported glucoses at home had been in the 200s. The patient had been hospitalized 7/17 - 07/11/2022 for treatment of UTI.  At discharge to the SNF she had been told to stop metformin and glipizide .  During that SNF stay for rehab all insulins were discontinued and she was monitored clinically.  Subsequently she did have some CBGs in the 200s and Semglee 10 units was started.  On 7/24 the Semglee was increased to 30 units at bedtime and Humalog 7 units with meals was added.  Apparently after being discharged from the SNF home she had restarted her metformin and glipizide.  Currently the patient was also administering insulin herself although she exhibited very poor insight regarding insulin dosage. At discharge 8/11 more liberal glycemic control had been recommended because of the high risk of recurrent hypoglycemia.  In the ED 8/21  family reported variability in her neurocognitive status with intermittent confusion.  She had been unable to complete ADLs and had been essentially bedridden since return home.  CT of the head revealed chronic microvascular ischemic changes and cerebral volume loss.   On exam areas of erythema, induration, and skin breakdown were noted within the gluteal cleft.  Chest/abdomen/pelvis CT revealed mild cutaneous edema within the buttocks suggestive of possible cellulitis without associated abscess or osteomyelitis.  Vancomycin and IV Rocephin were initiated empirically.  Blood cultures were negative. Glucoses while hospitalized ranged from a low of 114 up to high of 223.  Initially leukocytosis was present with a white count of 13,300.  Lactic acid level was 2.9.  C-reactive protein was 13.5.  She did develop a normochromic, normocytic anemia with H/H of 10.6/32.4.  Total protein was 6.5 and albumin was 3.0.  Magnesium was 1.5. Cellulitis improved with the antibiotic course and she was deemed stable enough for discharge to the SNF for rehab.  Past medical and surgical history includes history of uterine incontinence, essential hypertension, and diabetes with neuropathy, and history of stroke. Recent procedures include TAH and excision of a benign breast tumor.  Social history:non smoker, non drinker  Family history:noncontributory due to advanced age   Review of systems: Clinical neurocognitive deficits made validity of responses questionable . Date given as "August 24, 2022." She did identify the [place].  She had no comprehension of the diagnosis made in the hospital but was aware of the positive COVID screening here at the SNF.  When I asked how she were doing the response was "  okay I hope."  She does describe some loose stool.  She also has a nonproductive cough but no other COVID symptoms.  PT/OT reports that she is exhibiting anorexia clinically and did not eat her lunch.  Constitutional: No fever,  significant weight change Eyes: No redness, discharge, pain, vision change ENT/mouth: No nasal congestion, purulent discharge, earache, change in hearing, sore throat  Cardiovascular: No chest pain, palpitations, paroxysmal nocturnal dyspnea, claudication, edema  Respiratory: No sputum production, hemoptysis, DOE, significant snoring, apnea Gastrointestinal: No heartburn, dysphagia, abdominal pain, nausea /vomiting, rectal bleeding, melena Genitourinary: No dysuria, hematuria, pyuria, incontinence, nocturia Musculoskeletal: No joint stiffness, joint swelling, weakness, pain Dermatologic: No rash, pruritus, change in appearance of skin Neurologic: No dizziness, headache, syncope, seizures, numbness, tingling Psychiatric: No significant anxiety, depression, insomnia Endocrine: No change in hair/skin/nails, excessive thirst, excessive hunger, excessive urination  Hematologic/lymphatic: No significant bruising, lymphadenopathy, abnormal bleeding Allergy/immunology: No itchy/watery eyes, significant sneezing, urticaria, angioedema  Physical exam:  Pertinent or positive findings: Pattern alopecia is present.  She exhibits skin pallor.  The eyebrows are decreased laterally.  First heart sound is increased.  Breath sounds are decreased.  Abdomen is protuberant.  Pedal pulses are decreased.  Left great toenail is deformed.  Eschar is present at the left elbow.  She has ecchymoses of the left dorsal wrist and hematoma over the right dorsal wrist.  General appearance: Adequately nourished; no acute distress, increased work of breathing is present.   Lymphatic: No lymphadenopathy about the head, neck, axilla. Eyes: No conjunctival inflammation or lid edema is present. There is no scleral icterus. Ears:  External ear exam shows no significant lesions or deformities.   Nose:  External nasal examination shows no deformity or inflammation. Nasal mucosa are pink and moist without lesions, exudates Oral exam:  Lips and gums are healthy appearing.There is no oropharyngeal erythema or exudate. Neck:  No thyromegaly, masses, tenderness noted.    Heart:  Normal rate and regular rhythm.  S2 normal without gallop, murmur, click, rub.  Lungs:  without wheezes, rhonchi, rales, rubs. Abdomen: Bowel sounds are normal.  Abdomen is soft and nontender with no organomegaly, hernias, masses. GU: Deferred  Extremities:  No cyanosis, clubbing, edema. Neurologic exam: Balance, Rhomberg, finger to nose testing could not be completed due to clinical state Skin: Warm & dry w/o tenting. No significant rash.  See clinical summary under each active problem in the Problem List with associated updated therapeutic plan

## 2022-08-23 NOTE — Assessment & Plan Note (Addendum)
Nonseptic clinically.  Wound Care Nurse will follow patient for 3 unstageable wounds of the right buttock, 5 unstageable wounds of the left buttock and 1 unstageable wound @ the right ischium PTA to SNF.

## 2022-08-23 NOTE — Assessment & Plan Note (Addendum)
She is not cognizant of the reasons for the repeated hospitalizations. Apparently she had been managing her own insulin administration at home which had resulted in severe hypoglycemia with glucoses in the 20s necessitating hospitalization 8/9 - 08/03/2022.  A1c was 5.1%; glycemic liberalization was recommended to the family. She will not be able to manage her own insulin administration were it to be continued. Glimepiride is contraindicated in the context of the severe hypoglycemia.

## 2022-08-24 DIAGNOSIS — U071 COVID-19: Secondary | ICD-10-CM | POA: Insufficient documentation

## 2022-08-29 ENCOUNTER — Non-Acute Institutional Stay (SKILLED_NURSING_FACILITY): Payer: Medicare Other | Admitting: Adult Health

## 2022-08-29 ENCOUNTER — Encounter: Payer: Self-pay | Admitting: Adult Health

## 2022-08-29 DIAGNOSIS — E1140 Type 2 diabetes mellitus with diabetic neuropathy, unspecified: Secondary | ICD-10-CM | POA: Diagnosis not present

## 2022-08-29 DIAGNOSIS — E1165 Type 2 diabetes mellitus with hyperglycemia: Secondary | ICD-10-CM

## 2022-08-29 NOTE — Progress Notes (Signed)
Location:  [place] Room Number: 124-P Place of Service:  SNF (31) Provider: Ok Edwards, NP  CODE STATUS: FULL CODE  Allergies  Allergen Reactions   Codeine Nausea And Vomiting   Glipizide     Hypoglycemia with glucoses recorded in the 20s necessitating glucagon and D10 IV    Chief Complaint  Patient presents with   Acute Visit    Diabetes.     HPI:  Her cbg readings are elevated in the 200-300 range. She is presently taking metformin 500 mg daily and lispro 3 units with meals. Prior to her hospitalization she did experience a low cbg in the 20's. However it is unclear if she was following the directions given to her for her diabetes.   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  Tobacco Use   Smoking status: Never   Smokeless tobacco: Never  Vaping Use   Vaping Use: Never used  Substance and Sexual Activity   Alcohol use: No   Drug use: No   Sexual activity: Not Currently    Birth control/protection: None  Other Topics Concern   Not on file  Social History Narrative   Not on file   Social Determinants of Health   Financial Resource Strain: Not on file  Food Insecurity: No Food Insecurity (02/08/2019)   Hunger Vital Sign    Worried About Running Out of Food in the Last Year: Never true    [place] in the Last Year: Never true  Transportation Needs: No  Transportation Needs (02/08/2019)   PRAPARE - Hydrologist (Medical): No    Lack of Transportation (Non-Medical): No  Physical Activity: Inactive (02/08/2019)   Exercise Vital Sign    Days of Exercise per Week: 0 days    Minutes of Exercise per Session: 0 min  Stress: No Stress Concern Present (02/08/2019)   [place]    Feeling of Stress : Not at all  Social Connections: Unknown (02/08/2019)   Social Connection and Isolation Panel [NHANES]    Frequency of Communication with Friends and Family: More than three times a week    Frequency of Social Gatherings with Friends and Family: More than three times a week    Attends Religious Services: More than 4 times per year    Active Member of [employer] or Organizations: Not on file    Attends Archivist Meetings: Not on file    Marital Status: Married  Intimate Partner Violence: Not At Risk (02/08/2019)   Humiliation, Afraid, Rape, and Kick questionnaire    Fear of Current or Ex-Partner: No    Emotionally Abused: No    Physically Abused: No    Sexually Abused: No  Family History  Problem Relation Age of Onset   CAD Mother    Hypertension Mother    Stroke Father    Heart attack Brother    Cancer Brother       VITAL SIGNS BP 105/82   Pulse 78   Temp 98.2 F (36.8 C)   Resp (!) 22   Ht '5\' 9"'  (1.753 m)   Wt 173 lb 12.8 oz (78.8 kg)   SpO2 98%   BMI 25.67 kg/m   Outpatient Encounter Medications as of 08/29/2022  Medication Sig   amLODipine (NORVASC) 5 MG tablet Take 1 tablet (5 mg total) by mouth daily.   apixaban (ELIQUIS) 2.5 MG TABS tablet Take 2.5 mg by mouth 2 (two) times daily.   ascorbic acid (VITAMIN C) 500 MG tablet Take 500 mg by mouth 2 (two) times daily.   aspirin 325 MG tablet Take 1 tablet (325 mg total) by mouth daily.   Cholecalciferol (VITAMIN D3) 5000 units CAPS Take 1 capsule (5,000 Units total) by mouth daily.    collagenase (SANTYL) 250 UNIT/GM ointment Apply 1 Application topically daily. Apply to wounds right buttock, left buttock, and left ischium per tx orders.   diclofenac Sodium (VOLTAREN ARTHRITIS PAIN) 1 % GEL Apply 2 g topically 3 (three) times daily. bilateral knees for osteoarthritis   ergocalciferol (VITAMIN D2) 1.25 MG (50000 UT) capsule Take 50,000 Units by mouth once a week.   gabapentin (NEURONTIN) 300 MG capsule Take 300 mg three times daily   guaiFENesin-dextromethorphan (ROBITUSSIN DM) 100-10 MG/5ML syrup Take 5-10 mLs by mouth every 4 (four) hours as needed for cough.   insulin lispro (HUMALOG) 100 UNIT/ML injection Inject 3 Units into the skin 3 (three) times daily before meals.   Insulin Pen Needle (BD AUTOSHIELD DUO) 30G X 5 MM MISC by Does not apply route as directed. 30 gauge X 3/16   magnesium oxide (MAG-OX) 400 (240 Mg) MG tablet Take 200 mg by mouth daily.   metFORMIN (GLUCOPHAGE) 500 MG tablet Take 500 mg by mouth daily.   metoprolol succinate (TOPROL-XL) 50 MG 24 hr tablet Take 1 tablet (50 mg total) by mouth at bedtime. Take with or immediately following a meal.   polyethylene glycol (MIRALAX / GLYCOLAX) 17 g packet Take 17 g by mouth daily as needed for mild constipation.   simvastatin (ZOCOR) 20 MG tablet Take 1 tablet (20 mg total) by mouth every evening.   zinc gluconate 50 MG tablet Take 50 mg by mouth daily.   blood glucose meter kit and supplies KIT Dispense based on patient and insurance preference. Use to TEST BLOOD GLUCOSE THREE times daily as directed. DX E11.65   [DISCONTINUED] Insulin Pen Needle (PEN NEEDLES) 31G X 8 MM MISC Use as directed to inject insulin once daily   [DISCONTINUED] leptospermum manuka honey (MEDIHONEY) PSTE paste Apply 1 Application topically daily.   [DISCONTINUED] metFORMIN (GLUCOPHAGE) 500 MG tablet Take 1 tablet (500 mg total) by mouth 2 (two) times daily with a meal. (Patient taking differently: Take 500 mg by mouth daily with  breakfast.)   No facility-administered encounter medications on file as of 08/29/2022.     SIGNIFICANT DIAGNOSTIC EXAMS  PREVIOUS    07-09-22: chest x-ray:  The heart size and mediastinal contours are within normal limits. Both lungs are clear. The visualized skeletal structures are unremarkable.  08-04-22: chest x-ray:  No evidence of active cardiopulmonary process.   08-13-22: ct of head:  1. No acute intracranial abnormality. 2. Chronic microvascular  ischemic change and cerebral volume loss.  08-13-22: ct of chest abdomen and pelvis:  1. Possible mild subcutaneous edema within the buttocks which could reflect cellulitis. No evidence of abscess or osteomyelitis. 2. No other evidence of active infection within the chest, abdomen or pelvis. 3. Nonspecific prominent left inguinal lymph node which could be reactive, especially if there is evidence of pelvic inflammation clinically. 4. Slow growth of low-density left adnexal lesion from 2010, suspicious for benign cystic neoplasm. Given the patient's age of 34  years and the slow growth of this lesion, this is of doubtful clinical significance. 5. Distended bladder. 6. Coronary and aortic atherosclerosis . Emphysema  08-19-22: chest x-ray:  No acute abnormalities. Aortic Atherosclerosis   NO NEW EXAMS       LABS REVIEWED PREVIOUS   07-09-22: wbc 18.1; hgb 15.6; hct 47.7; mcv 91.4 plt 303; glucose 53; bun 22; creat 0.86; k+ 4.0; na++ 139; ca 9.7; gfr >60; protein 7.5; albumin 3.3; total bili 1.6; hgb a1c 5.1; urine culture: klebsiella pneumoniae  07-11-22: wbc 10.5; hgb 12.4; hct 38.8; mcv 92.4;plt 285; glucose 122; bun 31; creat 0.74; k+ 3.6; na++ 138; ca 8.9; gfr>60 mag 2.3  08-13-22: wbc 13.3; hgb 13.0; hct 39.8; mcv 90.5 plt 245; glucose 194; bun 25; creat 0.84; k+ 3.7; na++ 140; ca 9.4; gfr >60 protein 6.5; albumin 3.0; CRP 13.5; urine culture: no growth 08-15-22: wbc 8.4; hgb 11.1; hct 33.8; mcv 89.7 plt 207; glucose 141; bun 14;  creat 0.60; k+ 3.2; na++ 140; ca 8.8; gfr >60; mag 1.6 08-18-22: wbc 6.8; hgb 12.3; hct 37.2; mcv 89.4 plt 305; glucose 155; bun 11; creat 0.60; k+ 4.0; na++ 137; ca 88; gfr >60   NO NEW LABS.   Review of Systems  Constitutional:  Negative for malaise/fatigue.  Respiratory:  Negative for cough and shortness of breath.   Cardiovascular:  Negative for chest pain, palpitations and leg swelling.  Gastrointestinal:  Negative for abdominal pain, constipation and heartburn.  Musculoskeletal:  Negative for back pain, joint pain and myalgias.  Skin: Negative.   Neurological:  Negative for dizziness.  Psychiatric/Behavioral:  The patient is not nervous/anxious.     Physical Exam Constitutional:      General: She is not in acute distress.    Appearance: She is well-developed. She is not diaphoretic.  Neck:     Thyroid: No thyromegaly.  Cardiovascular:     Rate and Rhythm: Normal rate and regular rhythm.     Pulses: Normal pulses.     Heart sounds: Normal heart sounds.  Pulmonary:     Effort: Pulmonary effort is normal. No respiratory distress.     Breath sounds: Normal breath sounds.  Abdominal:     General: Bowel sounds are normal. There is no distension.     Palpations: Abdomen is soft.     Tenderness: There is no abdominal tenderness.  Musculoskeletal:        General: Normal range of motion.     Cervical back: Neck supple.     Right lower leg: No edema.     Left lower leg: No edema.  Lymphadenopathy:     Cervical: No cervical adenopathy.  Skin:    General: Skin is warm and dry.     Comments: Bilateral buttock; left ischium with unstageable wounds present. Without signs of infection present  Neurological:     Mental Status: She is alert. Mental status is at baseline.  Psychiatric:        Mood and  Affect: Mood normal.       ASSESSMENT/ PLAN:  TODAY  Poorly controlled type 2 diabetes mellitus with neuropathy  Will repeat hgb a1c Will continue metformin and lispro Will  begin lantus 10 units nightly    Ok Edwards NP [facility]  call [phone]

## 2022-08-31 ENCOUNTER — Other Ambulatory Visit: Payer: Self-pay | Admitting: *Deleted

## 2022-08-31 ENCOUNTER — Encounter (HOSPITAL_COMMUNITY)
Admission: RE | Admit: 2022-08-31 | Discharge: 2022-08-31 | Disposition: A | Discharge status: Home / Self Care | Payer: Medicare Other | Source: Skilled Nursing Facility | Attending: Adult Health | Admitting: Adult Health

## 2022-08-31 LAB — HEMOGLOBIN A1C
Hgb A1c MFr Bld: 7.4 % — ABNORMAL HIGH (ref 4.8–5.6)
Mean Plasma Glucose: 165.68 mg/dL

## 2022-08-31 NOTE — Patient Outreach (Signed)
[place] Coordinator follow up.   [name] resides in [facility]. Update received from [place] indicating [name]'s family is hopeful she will transition to ALF post SNF. [facility] to assess today.   Will continue to follow.   [name], MSN, RN,BSN [facility] Acute Care Coordinator [phone] [facility]) [phone]  (Toll free office)

## 2022-09-03 ENCOUNTER — Encounter: Payer: Self-pay | Admitting: Adult Health

## 2022-09-03 ENCOUNTER — Non-Acute Institutional Stay (SKILLED_NURSING_FACILITY): Payer: Medicare Other | Admitting: Adult Health

## 2022-09-03 DIAGNOSIS — E1165 Type 2 diabetes mellitus with hyperglycemia: Secondary | ICD-10-CM

## 2022-09-03 DIAGNOSIS — E1140 Type 2 diabetes mellitus with diabetic neuropathy, unspecified: Secondary | ICD-10-CM | POA: Diagnosis not present

## 2022-09-03 NOTE — Progress Notes (Signed)
Location:  [facility] Room Number: 124 Place of Service:  SNF (31)   CODE STATUS: full code   Allergies  Allergen Reactions   Codeine Nausea And Vomiting   Glipizide     Hypoglycemia with glucoses recorded in the 20s necessitating glucagon and D10 IV    Chief Complaint  Patient presents with   Acute Visit    Diabetes     HPI:  She is presently taking semglee 10 units nightly; humalog 3 units with meals; metformin 500 mg daily.  Her reading remain in the upper 200-200 range. There are no reports of excessive hunger or thirst. There are no reports of hypoglycemia.   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  Tobacco Use   Smoking status: Never   Smokeless tobacco: Never  Vaping Use   Vaping Use: Never used  Substance and Sexual Activity   Alcohol use: No   Drug use: No   Sexual activity: Not Currently    Birth control/protection: None  Other Topics Concern   Not on file  Social History Narrative   Not on file   Social Determinants of Health   Financial Resource Strain: Not on file  Food Insecurity: No Food Insecurity (02/08/2019)   Hunger Vital Sign    Worried About Running Out of Food in the Last Year: Never true    [place] in the Last Year: Never true  Transportation Needs: No Transportation Needs (02/08/2019)   PRAPARE - Radiographer, therapeutic (Medical): No    Lack of Transportation (Non-Medical): No  Physical Activity: Inactive (02/08/2019)   Exercise Vital Sign    Days of Exercise per Week: 0 days    Minutes of Exercise per Session: 0 min  Stress: No Stress Concern Present (02/08/2019)   [place]    Feeling of Stress : Not at all  Social Connections: Unknown (02/08/2019)   Social Connection and Isolation Panel [NHANES]    Frequency of Communication with Friends and Family: More than three times a week    Frequency of Social Gatherings with Friends and Family: More than three times a week    Attends Religious Services: More than 4 times per year    Active Member of [employer] or Organizations: Not on file    Attends Archivist Meetings: Not on file    Marital Status: Married  Intimate Partner Violence: Not At Risk (02/08/2019)   Humiliation, Afraid, Rape, and Kick questionnaire    Fear of Current or Ex-Partner: No    Emotionally Abused: No    Physically Abused: No    Sexually Abused: No   Family History  Problem Relation Age of Onset   CAD  Mother    Hypertension Mother    Stroke Father    Heart attack Brother    Cancer Brother       VITAL SIGNS BP 122/64   Pulse 68   Temp (!) 97.5 F (36.4 C)   Resp 20   Ht '5\' 9"'  (1.753 m)   Wt 173 lb 12.8 oz (78.8 kg)   SpO2 97%   BMI 25.67 kg/m   Outpatient Encounter Medications as of 09/03/2022  Medication Sig   amLODipine (NORVASC) 5 MG tablet Take 1 tablet (5 mg total) by mouth daily.   apixaban (ELIQUIS) 2.5 MG TABS tablet Take 2.5 mg by mouth 2 (two) times daily.   ascorbic acid (VITAMIN C) 500 MG tablet Take 500 mg by mouth 2 (two) times daily.   aspirin 325 MG tablet Take 1 tablet (325 mg total) by mouth daily.   blood glucose meter kit and supplies KIT Dispense based on patient and insurance preference. Use to TEST BLOOD GLUCOSE THREE times daily as directed. DX E11.65    Cholecalciferol (VITAMIN D3) 5000 units CAPS Take 1 capsule (5,000 Units total) by mouth daily.   collagenase (SANTYL) 250 UNIT/GM ointment Apply 1 Application topically daily. Apply to wounds right buttock, left buttock, and left ischium per tx orders.   diclofenac Sodium (VOLTAREN ARTHRITIS PAIN) 1 % GEL Apply 2 g topically 3 (three) times daily. bilateral knees for osteoarthritis   ergocalciferol (VITAMIN D2) 1.25 MG (50000 UT) capsule Take 50,000 Units by mouth once a week.   gabapentin (NEURONTIN) 300 MG capsule Take 300 mg three times daily   guaiFENesin-dextromethorphan (ROBITUSSIN DM) 100-10 MG/5ML syrup Take 5-10 mLs by mouth every 4 (four) hours as needed for cough.   insulin lispro (HUMALOG) 100 UNIT/ML injection Inject 3 Units into the skin 3 (three) times daily before meals.   Insulin Pen Needle (BD AUTOSHIELD DUO) 30G X 5 MM MISC by Does not apply route as directed. 30 gauge X 3/16   magnesium oxide (MAG-OX) 400 (240 Mg) MG tablet Take 200 mg by mouth daily.   metFORMIN (GLUCOPHAGE) 500 MG tablet Take 500 mg by mouth daily.   metoprolol succinate (TOPROL-XL) 50 MG 24 hr tablet Take 1 tablet (50 mg total) by mouth at bedtime. Take with or immediately following a meal.   polyethylene glycol (MIRALAX / GLYCOLAX) 17 g packet Take 17 g by mouth daily as needed for mild constipation.   simvastatin (ZOCOR) 20 MG tablet Take 1 tablet (20 mg total) by mouth every evening.   zinc gluconate 50 MG tablet Take 50 mg by mouth daily.   No facility-administered encounter medications on file as of 09/03/2022.     SIGNIFICANT DIAGNOSTIC EXAMS  PREVIOUS    07-09-22: chest x-ray:  The heart size and mediastinal contours are within normal limits. Both lungs are clear. The visualized skeletal structures are unremarkable.  08-04-22: chest x-ray:  No evidence of active cardiopulmonary process.   08-13-22: ct of head:  1. No acute intracranial abnormality. 2. Chronic microvascular ischemic change and  cerebral volume loss.  08-13-22: ct of chest abdomen and pelvis:  1. Possible mild subcutaneous edema within the buttocks which could reflect cellulitis. No evidence of abscess or osteomyelitis. 2. No other evidence of active infection within the chest, abdomen or pelvis. 3. Nonspecific prominent left inguinal lymph node which could be reactive, especially if there is evidence of pelvic inflammation clinically. 4. Slow growth of low-density left adnexal lesion from 2010, suspicious for benign cystic  neoplasm. Given the patient's age of 59  years and the slow growth of this lesion, this is of doubtful clinical significance. 5. Distended bladder. 6. Coronary and aortic atherosclerosis . Emphysema  08-19-22: chest x-ray:  No acute abnormalities. Aortic Atherosclerosis   NO NEW EXAMS       LABS REVIEWED PREVIOUS   07-09-22: wbc 18.1; hgb 15.6; hct 47.7; mcv 91.4 plt 303; glucose 53; bun 22; creat 0.86; k+ 4.0; na++ 139; ca 9.7; gfr >60; protein 7.5; albumin 3.3; total bili 1.6; hgb a1c 5.1; urine culture: klebsiella pneumoniae  07-11-22: wbc 10.5; hgb 12.4; hct 38.8; mcv 92.4;plt 285; glucose 122; bun 31; creat 0.74; k+ 3.6; na++ 138; ca 8.9; gfr>60 mag 2.3  08-13-22: wbc 13.3; hgb 13.0; hct 39.8; mcv 90.5 plt 245; glucose 194; bun 25; creat 0.84; k+ 3.7; na++ 140; ca 9.4; gfr >60 protein 6.5; albumin 3.0; CRP 13.5; urine culture: no growth 08-15-22: wbc 8.4; hgb 11.1; hct 33.8; mcv 89.7 plt 207; glucose 141; bun 14; creat 0.60; k+ 3.2; na++ 140; ca 8.8; gfr >60; mag 1.6 08-18-22: wbc 6.8; hgb 12.3; hct 37.2; mcv 89.4 plt 305; glucose 155; bun 11; creat 0.60; k+ 4.0; na++ 137; ca 88; gfr >60   TODAY  08-21-22: wbc 9.6; hgb 12.9; hct 39.5; mcv 89.2 plt 296; glucose 221; bun 23; creat 0.74; k+ 3.9; na++ 136; ca 8.6; gfr >60; d-dimer 1.98; crp 5.9 08-23-22: mag 1.7 08-31-22: hgb a1c 7.4     Review of Systems  Constitutional:  Negative for malaise/fatigue.  Respiratory:  Negative for cough and  shortness of breath.   Cardiovascular:  Negative for chest pain, palpitations and leg swelling.  Gastrointestinal:  Negative for abdominal pain, constipation and heartburn.  Musculoskeletal:  Negative for back pain, joint pain and myalgias.  Skin: Negative.   Neurological:  Negative for dizziness.  Psychiatric/Behavioral:  The patient is not nervous/anxious.    Physical Exam Constitutional:      General: She is not in acute distress.    Appearance: She is well-developed. She is not diaphoretic.  Neck:     Thyroid: No thyromegaly.  Cardiovascular:     Rate and Rhythm: Normal rate and regular rhythm.     Pulses: Normal pulses.     Heart sounds: Normal heart sounds.  Pulmonary:     Effort: Pulmonary effort is normal. No respiratory distress.     Breath sounds: Normal breath sounds.  Abdominal:     General: Bowel sounds are normal. There is no distension.     Palpations: Abdomen is soft.     Tenderness: There is no abdominal tenderness.  Musculoskeletal:        General: Normal range of motion.     Cervical back: Neck supple.     Right lower leg: No edema.     Left lower leg: No edema.  Lymphadenopathy:     Cervical: No cervical adenopathy.  Skin:    General: Skin is warm and dry.  Neurological:     Mental Status: She is alert. Mental status is at baseline.  Psychiatric:        Mood and Affect: Mood normal.       ASSESSMENT/ PLAN:  TODAY  Poorly controlled type 2 diabetes mellitus with neuropathy hgb a1c 7.4  Will continue humalog 3 units with meals Will increased semglee to 20 units nightly    Ok Edwards NP [facility]  [phone]

## 2022-09-06 ENCOUNTER — Encounter: Payer: Self-pay | Admitting: Adult Health

## 2022-09-06 ENCOUNTER — Non-Acute Institutional Stay (SKILLED_NURSING_FACILITY): Payer: Medicare Other | Admitting: Adult Health

## 2022-09-06 DIAGNOSIS — I700 Atherosclerosis of aorta: Secondary | ICD-10-CM | POA: Diagnosis not present

## 2022-09-06 DIAGNOSIS — F0390 Unspecified dementia without behavioral disturbance: Secondary | ICD-10-CM | POA: Diagnosis not present

## 2022-09-06 DIAGNOSIS — E1140 Type 2 diabetes mellitus with diabetic neuropathy, unspecified: Secondary | ICD-10-CM | POA: Diagnosis not present

## 2022-09-06 DIAGNOSIS — F0150 Vascular dementia without behavioral disturbance: Secondary | ICD-10-CM

## 2022-09-06 DIAGNOSIS — E1165 Type 2 diabetes mellitus with hyperglycemia: Secondary | ICD-10-CM | POA: Diagnosis not present

## 2022-09-06 NOTE — Progress Notes (Signed)
Location:  [facility] Room Number: 124-P Place of Service:  SNF (31) Provider: Ok Edwards, NP  CODE STATUS: FULL CODE  Allergies  Allergen Reactions   Codeine Nausea And Vomiting   Glipizide     Hypoglycemia with glucoses recorded in the 20s necessitating glucagon and D10 IV    Chief Complaint  Patient presents with   Acute Visit    Care plan meeting.    HPI:  We have come together for her care plan meeting. BIMS 12/15 mood 2/30. She is nonambulatory no falls since admission. She requires extensive to dependent assist with her adls. She is incontinent of bladder and bowel. Dietary: feeds self; weight is 175 pounds on regular diet; appetite 1-50%. Therapy: upper body min assist; lower body mod assist toileting max assist; bed mobility: mod assist; transfers mod assist; has completed therapy. She is due to go to assisted living next week. Activities: does not participate. She continues to be followed for her chronic illnesses including:  Aortic atherosclerosis Poorly controlled type 2 diabetes mellitus with neuropathy  Major neurocognitive disorder Vascular dementia without behavioral disturbance  She is being discharged to assisted living on Monday 09-10-22. She will need hospital bed; halo for hospital bed; wheelchair sacral foam prevention  Past Medical History:  Diagnosis Date   Diabetes mellitus with neuropathy    Diabetic neuropathy ([place])    Hypertension    Stroke ([place])    Urinary incontinence     Past Surgical History:  Procedure Laterality Date   ABDOMINAL HYSTERECTOMY     APPENDECTOMY     BREAST SURGERY     begnin tumor removed   CATARACT EXTRACTION, BILATERAL      Social History   Socioeconomic History   Marital status: Married    Spouse name: Celestine Prim   Number of children: 2   Years of education: Not on file   Highest education level: Not on file  Occupational History   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   Tobacco Use   Smoking status: Never   Smokeless tobacco: Never  Vaping Use   Vaping Use: Never used  Substance and Sexual Activity   Alcohol use: No   Drug use: No   Sexual activity: Not Currently    Birth control/protection: None  Other Topics Concern   Not on file  Social History Narrative   Not on file   Social Determinants of Health   Financial Resource Strain: Not on file  Food Insecurity: No Food Insecurity (02/08/2019)   Hunger Vital Sign    Worried About Running Out of Food in the Last Year: Never true    [place] in the Last Year: Never true  Transportation Needs: No Transportation Needs (02/08/2019)   PRAPARE - Hydrologist (Medical): No    Lack of Transportation (Non-Medical): No  Physical Activity: Inactive (02/08/2019)   Exercise Vital Sign    Days of Exercise per Week: 0 days    Minutes of Exercise per Session: 0 min  Stress: No Stress Concern Present (02/08/2019)   [place]    Feeling of Stress : Not at all  Social Connections: Unknown (02/08/2019)   Social Connection and Isolation Panel [NHANES]    Frequency of Communication with Friends and Family: More than three times a week    Frequency of Social Gatherings with Friends and Family: More than three times a  week    Attends Religious Services: More than 4 times per year    Active Member of Clubs or Organizations: Not on file    Attends Club or Organization Meetings: Not on file    Marital Status: Married  Intimate Partner Violence: Not At Risk (02/08/2019)   Humiliation, Afraid, Rape, and Kick questionnaire    Fear of Current or Ex-Partner: No    Emotionally Abused: No    Physically Abused: No    Sexually Abused: No   Family History  Problem Relation Age of Onset   CAD Mother    Hypertension Mother    Stroke Father    Heart attack Brother    Cancer Brother       VITAL SIGNS BP 134/75   Pulse  72   Temp 97.7 F (36.5 C)   Resp 18   Ht 5' 9" (1.753 m)   Wt 173 lb 6.4 oz (78.7 kg)   SpO2 95%   BMI 25.61 kg/m   Outpatient Encounter Medications as of 09/06/2022  Medication Sig   amLODipine (NORVASC) 5 MG tablet Take 1 tablet (5 mg total) by mouth daily.   apixaban (ELIQUIS) 2.5 MG TABS tablet Take 2.5 mg by mouth 2 (two) times daily.   aspirin 325 MG tablet Take 1 tablet (325 mg total) by mouth daily.   Cholecalciferol (VITAMIN D3) 5000 units CAPS Take 1 capsule (5,000 Units total) by mouth daily.   collagenase (SANTYL) 250 UNIT/GM ointment Apply 1 Application topically daily. Apply to wounds right buttock, left buttock, and left ischium per tx orders.   diclofenac Sodium (VOLTAREN ARTHRITIS PAIN) 1 % GEL Apply 2 g topically 3 (three) times daily. bilateral knees for osteoarthritis   gabapentin (NEURONTIN) 300 MG capsule Take 300 mg three times daily   guaiFENesin-dextromethorphan (ROBITUSSIN DM) 100-10 MG/5ML syrup Take 5-10 mLs by mouth every 4 (four) hours as needed for cough.   insulin glargine-yfgn (SEMGLEE) 100 UNIT/ML Pen Inject 20 Units into the skin at bedtime.   insulin lispro (HUMALOG) 100 UNIT/ML injection Inject 3 Units into the skin 3 (three) times daily before meals.   Insulin Pen Needle (BD AUTOSHIELD DUO) 30G X 5 MM MISC by Does not apply route as directed. 30 gauge X 3/16   magnesium oxide (MAG-OX) 400 (240 Mg) MG tablet Take 200 mg by mouth daily.   metFORMIN (GLUCOPHAGE) 500 MG tablet Take 500 mg by mouth daily.   metoprolol succinate (TOPROL-XL) 50 MG 24 hr tablet Take 1 tablet (50 mg total) by mouth at bedtime. Take with or immediately following a meal.   polyethylene glycol (MIRALAX / GLYCOLAX) 17 g packet Take 17 g by mouth daily as needed for mild constipation.   simvastatin (ZOCOR) 20 MG tablet Take 1 tablet (20 mg total) by mouth every evening.   blood glucose meter kit and supplies KIT Dispense based on patient and insurance preference. Use to TEST BLOOD  GLUCOSE THREE times daily as directed. DX E11.65   [DISCONTINUED] ascorbic acid (VITAMIN C) 500 MG tablet Take 500 mg by mouth 2 (two) times daily.   [DISCONTINUED] ergocalciferol (VITAMIN D2) 1.25 MG (50000 UT) capsule Take 50,000 Units by mouth once a week.   [DISCONTINUED] zinc gluconate 50 MG tablet Take 50 mg by mouth daily.   No facility-administered encounter medications on file as of 09/06/2022.     SIGNIFICANT DIAGNOSTIC EXAMS   PREVIOUS    07-09-22: chest x-ray:  The heart size and mediastinal contours are within normal  limits. Both lungs are clear. The visualized skeletal structures are unremarkable.  08-04-22: chest x-ray:  No evidence of active cardiopulmonary process.   08-13-22: ct of head:  1. No acute intracranial abnormality. 2. Chronic microvascular ischemic change and cerebral volume loss.  08-13-22: ct of chest abdomen and pelvis:  1. Possible mild subcutaneous edema within the buttocks which could reflect cellulitis. No evidence of abscess or osteomyelitis. 2. No other evidence of active infection within the chest, abdomen or pelvis. 3. Nonspecific prominent left inguinal lymph node which could be reactive, especially if there is evidence of pelvic inflammation clinically. 4. Slow growth of low-density left adnexal lesion from 2010, suspicious for benign cystic neoplasm. Given the patient's age of 4  years and the slow growth of this lesion, this is of doubtful clinical significance. 5. Distended bladder. 6. Coronary and aortic atherosclerosis . Emphysema  08-19-22: chest x-ray:  No acute abnormalities. Aortic Atherosclerosis   NO NEW EXAMS       LABS REVIEWED PREVIOUS   07-09-22: wbc 18.1; hgb 15.6; hct 47.7; mcv 91.4 plt 303; glucose 53; bun 22; creat 0.86; k+ 4.0; na++ 139; ca 9.7; gfr >60; protein 7.5; albumin 3.3; total bili 1.6; hgb a1c 5.1; urine culture: klebsiella pneumoniae  07-11-22: wbc 10.5; hgb 12.4; hct 38.8; mcv 92.4;plt 285; glucose 122; bun  31; creat 0.74; k+ 3.6; na++ 138; ca 8.9; gfr>60 mag 2.3  08-13-22: wbc 13.3; hgb 13.0; hct 39.8; mcv 90.5 plt 245; glucose 194; bun 25; creat 0.84; k+ 3.7; na++ 140; ca 9.4; gfr >60 protein 6.5; albumin 3.0; CRP 13.5; urine culture: no growth 08-15-22: wbc 8.4; hgb 11.1; hct 33.8; mcv 89.7 plt 207; glucose 141; bun 14; creat 0.60; k+ 3.2; na++ 140; ca 8.8; gfr >60; mag 1.6 08-18-22: wbc 6.8; hgb 12.3; hct 37.2; mcv 89.4 plt 305; glucose 155; bun 11; creat 0.60; k+ 4.0; na++ 137; ca 88; gfr >60  08-21-22: wbc 9.6; hgb 12.9; hct 39.5; mcv 89.2 plt 296; glucose 221; bun 23; creat 0.74; k+ 3.9; na++ 136; ca 8.6; gfr >60; d-dimer 1.98; crp 5.9 08-23-22: mag 1.7 08-31-22: hgb a1c 7.4  NO NEW LABS.     Review of Systems  Constitutional:  Negative for malaise/fatigue.  Respiratory:  Negative for cough and shortness of breath.   Cardiovascular:  Negative for chest pain, palpitations and leg swelling.  Gastrointestinal:  Negative for abdominal pain, constipation and heartburn.  Musculoskeletal:  Negative for back pain, joint pain and myalgias.  Skin: Negative.   Neurological:  Negative for dizziness.  Psychiatric/Behavioral:  The patient is not nervous/anxious.    Physical Exam Constitutional:      General: She is not in acute distress.    Appearance: She is well-developed. She is not diaphoretic.  Neck:     Thyroid: No thyromegaly.  Cardiovascular:     Rate and Rhythm: Normal rate and regular rhythm.     Pulses: Normal pulses.     Heart sounds: Normal heart sounds.  Pulmonary:     Effort: Pulmonary effort is normal. No respiratory distress.     Breath sounds: Normal breath sounds.  Abdominal:     General: Bowel sounds are normal. There is no distension.     Palpations: Abdomen is soft.     Tenderness: There is no abdominal tenderness.  Musculoskeletal:        General: Normal range of motion.     Cervical back: Neck supple.     Right lower leg: No edema.  Left lower leg: No edema.   Lymphadenopathy:     Cervical: No cervical adenopathy.  Skin:    General: Skin is warm and dry.  Neurological:     Mental Status: She is alert. Mental status is at baseline.  Psychiatric:        Mood and Affect: Mood normal.       ASSESSMENT/ PLAN:  TODAY  Aortic atherosclerosis Poorly controlled type 2 diabetes mellitus with neuropathy Major neurocognitive disorder Vascular dementia without behavioral disturbance  Will discharge her to assisted living.   Time spent with patient: 40 minutes: therapy; dietary; discharge.     Ok Edwards NP [facility]  call [phone]

## 2022-09-11 ENCOUNTER — Other Ambulatory Visit: Payer: Self-pay | Admitting: *Deleted

## 2022-09-11 NOTE — Patient Outreach (Signed)
[facility] Post- Acute Care Coordinator follow up. Mrs. [name] resides in [facility]. Screening for [facility] care coordination/care management services as benefit of insurance plan and PCP.   Update received from [place]. Mrs. Hase moving into [facility] on Thursday, 09/13/22.   No identifiable [facility] needs at this time.    [name], MSN, RN,BSN [place] Acute Care Coordinator [phone] (Direct dial)

## 2022-09-12 DIAGNOSIS — J9620 Acute and chronic respiratory failure, unspecified whether with hypoxia or hypercapnia: Secondary | ICD-10-CM | POA: Diagnosis not present

## 2022-09-12 DIAGNOSIS — I639 Cerebral infarction, unspecified: Secondary | ICD-10-CM | POA: Diagnosis not present

## 2022-09-13 ENCOUNTER — Other Ambulatory Visit: Payer: Self-pay | Admitting: *Deleted

## 2022-09-13 NOTE — Patient Outreach (Signed)
[place] Coordinator follow up. Verified in [facility] Mrs. Tkach discharged from [facility] today 09/13/22.  Transitioned to [facility].  No identifiable [facility] care coordination needs.    [name], MSN, RN,BSN [place] Acute Care Coordinator [phone] (Direct dial)

## 2022-09-14 DIAGNOSIS — E1159 Type 2 diabetes mellitus with other circulatory complications: Secondary | ICD-10-CM | POA: Diagnosis not present

## 2022-09-14 DIAGNOSIS — E11649 Type 2 diabetes mellitus with hypoglycemia without coma: Secondary | ICD-10-CM | POA: Diagnosis not present

## 2022-09-14 DIAGNOSIS — E1140 Type 2 diabetes mellitus with diabetic neuropathy, unspecified: Secondary | ICD-10-CM | POA: Diagnosis not present

## 2022-09-14 DIAGNOSIS — E785 Hyperlipidemia, unspecified: Secondary | ICD-10-CM | POA: Diagnosis not present

## 2022-09-14 DIAGNOSIS — I152 Hypertension secondary to endocrine disorders: Secondary | ICD-10-CM | POA: Diagnosis not present

## 2022-09-21 DIAGNOSIS — R531 Weakness: Secondary | ICD-10-CM | POA: Diagnosis not present

## 2022-09-21 DIAGNOSIS — I6930 Unspecified sequelae of cerebral infarction: Secondary | ICD-10-CM | POA: Diagnosis not present

## 2022-09-21 DIAGNOSIS — E1140 Type 2 diabetes mellitus with diabetic neuropathy, unspecified: Secondary | ICD-10-CM | POA: Diagnosis not present

## 2022-09-21 DIAGNOSIS — M179 Osteoarthritis of knee, unspecified: Secondary | ICD-10-CM | POA: Diagnosis not present

## 2022-09-21 DIAGNOSIS — R262 Difficulty in walking, not elsewhere classified: Secondary | ICD-10-CM | POA: Diagnosis not present

## 2022-09-21 DIAGNOSIS — I6990 Unspecified sequelae of unspecified cerebrovascular disease: Secondary | ICD-10-CM | POA: Diagnosis not present

## 2022-09-21 DIAGNOSIS — E785 Hyperlipidemia, unspecified: Secondary | ICD-10-CM | POA: Diagnosis not present

## 2022-09-21 DIAGNOSIS — I10 Essential (primary) hypertension: Secondary | ICD-10-CM | POA: Diagnosis not present

## 2022-09-25 ENCOUNTER — Telehealth: Payer: Self-pay

## 2022-09-25 NOTE — Telephone Encounter (Signed)
Received called from patient that she was having OAB symptoms and want to know if Dr. Alyson Ingles could give her some samples until next follow up appt. Made patient aware that she had to be seen by the provider so her symptoms could be accessed. Patient voiced understanding.

## 2022-09-26 ENCOUNTER — Telehealth: Payer: Self-pay

## 2022-09-26 DIAGNOSIS — E785 Hyperlipidemia, unspecified: Secondary | ICD-10-CM | POA: Diagnosis not present

## 2022-09-26 DIAGNOSIS — E1159 Type 2 diabetes mellitus with other circulatory complications: Secondary | ICD-10-CM | POA: Diagnosis not present

## 2022-09-26 DIAGNOSIS — I152 Hypertension secondary to endocrine disorders: Secondary | ICD-10-CM | POA: Diagnosis not present

## 2022-09-26 DIAGNOSIS — E1140 Type 2 diabetes mellitus with diabetic neuropathy, unspecified: Secondary | ICD-10-CM | POA: Diagnosis not present

## 2022-09-26 DIAGNOSIS — E11649 Type 2 diabetes mellitus with hypoglycemia without coma: Secondary | ICD-10-CM | POA: Diagnosis not present

## 2022-09-26 MED ORDER — TROSPIUM CHLORIDE ER 60 MG PO CP24: 60.0000 mg | ORAL_CAPSULE | Freq: Every day | ORAL | 1 refills | Status: DC

## 2022-09-26 MED ORDER — SOLIFENACIN SUCCINATE 5 MG PO TABS: 5.0000 mg | ORAL_TABLET | Freq: Every day | ORAL | 1 refills | Status: DC

## 2022-09-26 NOTE — Telephone Encounter (Signed)
[facility] facility faxed over a request for pt to continue OAB medications that were d/c at hospital discharge.  Dr. Royann Shivers gave verbal to continue vesicare 5 mg and trospium '60mg'$ .  RX printed and faxed to facility.

## 2022-09-28 DIAGNOSIS — E1140 Type 2 diabetes mellitus with diabetic neuropathy, unspecified: Secondary | ICD-10-CM | POA: Diagnosis not present

## 2022-09-28 DIAGNOSIS — I152 Hypertension secondary to endocrine disorders: Secondary | ICD-10-CM | POA: Diagnosis not present

## 2022-09-28 DIAGNOSIS — E11649 Type 2 diabetes mellitus with hypoglycemia without coma: Secondary | ICD-10-CM | POA: Diagnosis not present

## 2022-09-28 DIAGNOSIS — E1159 Type 2 diabetes mellitus with other circulatory complications: Secondary | ICD-10-CM | POA: Diagnosis not present

## 2022-09-28 DIAGNOSIS — E785 Hyperlipidemia, unspecified: Secondary | ICD-10-CM | POA: Diagnosis not present

## 2022-10-01 DIAGNOSIS — E11649 Type 2 diabetes mellitus with hypoglycemia without coma: Secondary | ICD-10-CM | POA: Diagnosis not present

## 2022-10-01 DIAGNOSIS — I152 Hypertension secondary to endocrine disorders: Secondary | ICD-10-CM | POA: Diagnosis not present

## 2022-10-01 DIAGNOSIS — E1140 Type 2 diabetes mellitus with diabetic neuropathy, unspecified: Secondary | ICD-10-CM | POA: Diagnosis not present

## 2022-10-01 DIAGNOSIS — E1159 Type 2 diabetes mellitus with other circulatory complications: Secondary | ICD-10-CM | POA: Diagnosis not present

## 2022-10-01 DIAGNOSIS — E785 Hyperlipidemia, unspecified: Secondary | ICD-10-CM | POA: Diagnosis not present

## 2022-10-02 DIAGNOSIS — I10 Essential (primary) hypertension: Secondary | ICD-10-CM | POA: Diagnosis not present

## 2022-10-02 DIAGNOSIS — E1121 Type 2 diabetes mellitus with diabetic nephropathy: Secondary | ICD-10-CM | POA: Diagnosis not present

## 2022-10-02 DIAGNOSIS — Z8673 Personal history of transient ischemic attack (TIA), and cerebral infarction without residual deficits: Secondary | ICD-10-CM | POA: Diagnosis not present

## 2022-10-04 DIAGNOSIS — L03317 Cellulitis of buttock: Secondary | ICD-10-CM | POA: Diagnosis not present

## 2022-10-04 DIAGNOSIS — I152 Hypertension secondary to endocrine disorders: Secondary | ICD-10-CM | POA: Diagnosis not present

## 2022-10-04 DIAGNOSIS — E11649 Type 2 diabetes mellitus with hypoglycemia without coma: Secondary | ICD-10-CM | POA: Diagnosis not present

## 2022-10-04 DIAGNOSIS — E1140 Type 2 diabetes mellitus with diabetic neuropathy, unspecified: Secondary | ICD-10-CM | POA: Diagnosis not present

## 2022-10-04 DIAGNOSIS — E1159 Type 2 diabetes mellitus with other circulatory complications: Secondary | ICD-10-CM | POA: Diagnosis not present

## 2022-10-04 DIAGNOSIS — M6281 Muscle weakness (generalized): Secondary | ICD-10-CM | POA: Diagnosis not present

## 2022-10-04 DIAGNOSIS — E785 Hyperlipidemia, unspecified: Secondary | ICD-10-CM | POA: Diagnosis not present

## 2022-10-04 DIAGNOSIS — G9341 Metabolic encephalopathy: Secondary | ICD-10-CM | POA: Diagnosis not present

## 2022-10-04 DIAGNOSIS — I10 Essential (primary) hypertension: Secondary | ICD-10-CM | POA: Diagnosis not present

## 2022-10-05 ENCOUNTER — Ambulatory Visit: Payer: Medicare Other | Admitting: Urology

## 2022-10-09 DIAGNOSIS — I152 Hypertension secondary to endocrine disorders: Secondary | ICD-10-CM | POA: Diagnosis not present

## 2022-10-09 DIAGNOSIS — E11649 Type 2 diabetes mellitus with hypoglycemia without coma: Secondary | ICD-10-CM | POA: Diagnosis not present

## 2022-10-09 DIAGNOSIS — E1159 Type 2 diabetes mellitus with other circulatory complications: Secondary | ICD-10-CM | POA: Diagnosis not present

## 2022-10-09 DIAGNOSIS — E785 Hyperlipidemia, unspecified: Secondary | ICD-10-CM | POA: Diagnosis not present

## 2022-10-09 DIAGNOSIS — E1140 Type 2 diabetes mellitus with diabetic neuropathy, unspecified: Secondary | ICD-10-CM | POA: Diagnosis not present

## 2022-10-09 DIAGNOSIS — N390 Urinary tract infection, site not specified: Secondary | ICD-10-CM | POA: Diagnosis not present

## 2022-10-10 ENCOUNTER — Ambulatory Visit: Payer: Medicare Other | Admitting: Physician Assistant

## 2022-10-10 VITALS — BP 143/63 | HR 82 | Wt 175.4 lb

## 2022-10-10 DIAGNOSIS — R339 Retention of urine, unspecified: Secondary | ICD-10-CM | POA: Diagnosis not present

## 2022-10-10 DIAGNOSIS — N3281 Overactive bladder: Secondary | ICD-10-CM

## 2022-10-10 DIAGNOSIS — N39490 Overflow incontinence: Secondary | ICD-10-CM | POA: Diagnosis not present

## 2022-10-10 LAB — BLADDER SCAN AMB NON-IMAGING: Scan Result: 908

## 2022-10-10 MED ORDER — SILODOSIN 8 MG PO CAPS: 8.0000 mg | ORAL_CAPSULE | Freq: Every day | ORAL | 0 refills | Status: DC

## 2022-10-10 NOTE — Progress Notes (Signed)
Assessment: 1. Urinary retention - INSERT,TEMP INDWELLING BLAD CATH,SIMPLE  2. Overflow incontinence of urine  3. Overactive bladder - BLADDER SCAN AMB NON-IMAGING - Urinalysis, Routine w reflex microscopic    Plan: Patient was found to be in urinary retention with over 900 mL of urine in her bladder.  Foley catheter placed and sterile urine collected for urinalysis.  Orders are written to discontinue the patient's Vesicare and trospium at this time and she will begin silodosin daily until she follows up in 1 month for voiding trial versus catheter change.  Have requested medical records from [facility] living for their urinalysis results and culture results.  Meds ordered this encounter  Medications   silodosin (RAPAFLO) 8 MG CAPS capsule    Sig: Take 1 capsule (8 mg total) by mouth daily with breakfast.    Dispense:  30 capsule    Refill:  0     Chief Complaint: No chief complaint on file.   HPI: [name] is a 88 y.o. female with h/o OAB and UTIs, currently living at [facility], who presents for continued evaluation of OAB, frequency, UUI, and nocturia. Pt has not been seen since 2021.  She was admitted for UTI on August 20, 2022 and discharged to skilled facility.  She is now at [facility] living for assisted living.  Dr. Alyson Ingles consulted during her admission to the hospital recently and gave verbal orders for her to resume Vesicare 5 mg and trospium 60 mg daily.  Since discharge she has been taking these medications, with an actual increase in urgency, frequency, nocturia.  She denies burning, dysuria, gross hematuria.  UA=urine collected after office hours. Will refrigerate urine specimen and run UA in the morning. PVR=939m, pt has no urge to void   Portions of the above documentation were copied from a prior visit for review purposes only.  Allergies: Allergies  Allergen Reactions   Codeine Nausea And Vomiting   Glipizide      Hypoglycemia with glucoses recorded in the 20s necessitating glucagon and D10 IV   Sulfa Antibiotics Other (See Comments)    Unspecified     PMH: Past Medical History:  Diagnosis Date   Diabetes mellitus with neuropathy    Diabetic neuropathy (HCC)    Hypertension    Stroke ([facility]    Urinary incontinence     PSH: Past Surgical History:  Procedure Laterality Date   ABDOMINAL HYSTERECTOMY     APPENDECTOMY     BREAST SURGERY     begnin tumor removed   CATARACT EXTRACTION, BILATERAL      SH: Social History   Tobacco Use   Smoking status: Never   Smokeless tobacco: Never  Vaping Use   Vaping Use: Never used  Substance Use Topics   Alcohol use: No   Drug use: No    ROS: All other review of systems were reviewed and are negative except what is noted above in HPI  PE: BP (!) 143/63   Pulse 82   Wt 175 lb 6.4 oz (79.6 kg)   BMI 25.90 kg/m  GENERAL APPEARANCE:  Well appearing, well developed, well nourished, NAD HEENT:  Atraumatic, normocephalic NECK:  Supple. Trachea midline ABDOMEN:  Soft, non-tender, no masses EXTREMITIES:  Moves all extremities well, mobilizing with assist NEUROLOGIC:  Alert and oriented x 3, MENTAL STATUS:  appropriate BACK:  Non-tender to palpation, No CVAT SKIN:  Warm, dry, and intact   Results: Laboratory Data: Lab Results  Component Value Date  WBC 9.6 08/21/2022   HGB 12.9 08/21/2022   HCT 39.5 08/21/2022   MCV 89.2 08/21/2022   PLT 296 08/21/2022    Lab Results  Component Value Date   CREATININE 0.74 08/21/2022    No results found for: "PSA"  No results found for: "TESTOSTERONE"  Lab Results  Component Value Date   HGBA1C 7.4 (H) 08/31/2022    Urinalysis    Component Value Date/Time   COLORURINE YELLOW 08/19/2022 1424   APPEARANCEUR CLEAR 08/19/2022 1424   APPEARANCEUR Clear 10/25/2021 1450   LABSPEC 1.014 08/19/2022 1424   PHURINE 6.0 08/19/2022 1424   GLUCOSEU NEGATIVE 08/19/2022 1424   HGBUR MODERATE (A)  08/19/2022 1424   BILIRUBINUR NEGATIVE 08/19/2022 1424   BILIRUBINUR Negative 10/25/2021 1450   KETONESUR 20 (A) 08/19/2022 1424   PROTEINUR NEGATIVE 08/19/2022 1424   UROBILINOGEN 0.2 09/06/2009 0135   NITRITE NEGATIVE 08/19/2022 1424   LEUKOCYTESUR NEGATIVE 08/19/2022 1424    Lab Results  Component Value Date   LABMICR See below: 10/25/2021   WBCUA 0-5 10/25/2021   LABEPIT 0-10 10/25/2021   BACTERIA NONE SEEN 08/19/2022    Pertinent Imaging: No results found for this or any previous visit.  No results found for this or any previous visit.  No results found for this or any previous visit.  No results found for this or any previous visit.  No results found for this or any previous visit.  No valid procedures specified. No results found for this or any previous visit.  No results found for this or any previous visit.  No results found for this or any previous visit (from the past 24 hour(s)).

## 2022-10-10 NOTE — Progress Notes (Signed)
4:37 PM post void residual =908 Simple Catheter Placement  Due to urinary retention patient is present today for a foley cath placement.   A 14 FR foley catheter was inserted, urine return was noted  1011m, urine was yellow in color.  The balloon was filled with 10cc of sterile water.  A overnight bag was attached for drainage. Patient was also given a night bag to take home and was given instruction on how to change from one bag to another.  Patient was given instruction on proper catheter care.  Patient tolerated well, no complications were noted   Performed by: SMarisue Brooklyn CMA, AEstill Bamberg RN, Hope,LPN  Additional notes/ Follow up: Follow up as scheduled

## 2022-10-11 DIAGNOSIS — E1159 Type 2 diabetes mellitus with other circulatory complications: Secondary | ICD-10-CM | POA: Diagnosis not present

## 2022-10-11 DIAGNOSIS — I152 Hypertension secondary to endocrine disorders: Secondary | ICD-10-CM | POA: Diagnosis not present

## 2022-10-11 DIAGNOSIS — E11649 Type 2 diabetes mellitus with hypoglycemia without coma: Secondary | ICD-10-CM | POA: Diagnosis not present

## 2022-10-11 DIAGNOSIS — E1140 Type 2 diabetes mellitus with diabetic neuropathy, unspecified: Secondary | ICD-10-CM | POA: Diagnosis not present

## 2022-10-11 DIAGNOSIS — E785 Hyperlipidemia, unspecified: Secondary | ICD-10-CM | POA: Diagnosis not present

## 2022-10-12 DIAGNOSIS — E1121 Type 2 diabetes mellitus with diabetic nephropathy: Secondary | ICD-10-CM | POA: Diagnosis not present

## 2022-10-12 DIAGNOSIS — I639 Cerebral infarction, unspecified: Secondary | ICD-10-CM | POA: Diagnosis not present

## 2022-10-12 DIAGNOSIS — E785 Hyperlipidemia, unspecified: Secondary | ICD-10-CM | POA: Diagnosis not present

## 2022-10-12 DIAGNOSIS — E11649 Type 2 diabetes mellitus with hypoglycemia without coma: Secondary | ICD-10-CM | POA: Diagnosis not present

## 2022-10-12 DIAGNOSIS — E782 Mixed hyperlipidemia: Secondary | ICD-10-CM | POA: Diagnosis not present

## 2022-10-12 DIAGNOSIS — J9620 Acute and chronic respiratory failure, unspecified whether with hypoxia or hypercapnia: Secondary | ICD-10-CM | POA: Diagnosis not present

## 2022-10-12 DIAGNOSIS — Z8673 Personal history of transient ischemic attack (TIA), and cerebral infarction without residual deficits: Secondary | ICD-10-CM | POA: Diagnosis not present

## 2022-10-12 DIAGNOSIS — E1140 Type 2 diabetes mellitus with diabetic neuropathy, unspecified: Secondary | ICD-10-CM | POA: Diagnosis not present

## 2022-10-12 DIAGNOSIS — E1159 Type 2 diabetes mellitus with other circulatory complications: Secondary | ICD-10-CM | POA: Diagnosis not present

## 2022-10-12 DIAGNOSIS — I152 Hypertension secondary to endocrine disorders: Secondary | ICD-10-CM | POA: Diagnosis not present

## 2022-10-12 DIAGNOSIS — I10 Essential (primary) hypertension: Secondary | ICD-10-CM | POA: Diagnosis not present

## 2022-10-15 ENCOUNTER — Other Ambulatory Visit: Payer: Self-pay

## 2022-10-15 DIAGNOSIS — R339 Retention of urine, unspecified: Secondary | ICD-10-CM

## 2022-10-16 DIAGNOSIS — E1159 Type 2 diabetes mellitus with other circulatory complications: Secondary | ICD-10-CM | POA: Diagnosis not present

## 2022-10-16 DIAGNOSIS — E11649 Type 2 diabetes mellitus with hypoglycemia without coma: Secondary | ICD-10-CM | POA: Diagnosis not present

## 2022-10-16 DIAGNOSIS — E1140 Type 2 diabetes mellitus with diabetic neuropathy, unspecified: Secondary | ICD-10-CM | POA: Diagnosis not present

## 2022-10-16 DIAGNOSIS — R339 Retention of urine, unspecified: Secondary | ICD-10-CM | POA: Diagnosis not present

## 2022-10-17 DIAGNOSIS — I152 Hypertension secondary to endocrine disorders: Secondary | ICD-10-CM | POA: Diagnosis not present

## 2022-10-17 DIAGNOSIS — E785 Hyperlipidemia, unspecified: Secondary | ICD-10-CM | POA: Diagnosis not present

## 2022-10-17 DIAGNOSIS — R339 Retention of urine, unspecified: Secondary | ICD-10-CM | POA: Diagnosis not present

## 2022-10-17 DIAGNOSIS — E1159 Type 2 diabetes mellitus with other circulatory complications: Secondary | ICD-10-CM | POA: Diagnosis not present

## 2022-10-17 DIAGNOSIS — E11649 Type 2 diabetes mellitus with hypoglycemia without coma: Secondary | ICD-10-CM | POA: Diagnosis not present

## 2022-10-17 DIAGNOSIS — E1140 Type 2 diabetes mellitus with diabetic neuropathy, unspecified: Secondary | ICD-10-CM | POA: Diagnosis not present

## 2022-10-18 ENCOUNTER — Encounter: Payer: Self-pay | Admitting: Physician Assistant

## 2022-10-18 ENCOUNTER — Telehealth: Payer: Self-pay

## 2022-10-18 DIAGNOSIS — E785 Hyperlipidemia, unspecified: Secondary | ICD-10-CM | POA: Diagnosis not present

## 2022-10-18 DIAGNOSIS — I152 Hypertension secondary to endocrine disorders: Secondary | ICD-10-CM | POA: Diagnosis not present

## 2022-10-18 DIAGNOSIS — E11649 Type 2 diabetes mellitus with hypoglycemia without coma: Secondary | ICD-10-CM | POA: Diagnosis not present

## 2022-10-18 DIAGNOSIS — E1159 Type 2 diabetes mellitus with other circulatory complications: Secondary | ICD-10-CM | POA: Diagnosis not present

## 2022-10-18 DIAGNOSIS — E1140 Type 2 diabetes mellitus with diabetic neuropathy, unspecified: Secondary | ICD-10-CM | POA: Diagnosis not present

## 2022-10-18 NOTE — Telephone Encounter (Signed)
-----   Message from Reynaldo Minium, [place] sent at 10/18/2022  2:16 PM EDT ----- Please find out if the facility treated this culture. If not, she needs 7 day course of Macrobid. Thanks tons! ----- Message ----- From: Jennette Bill Sent: 10/17/2022   9:12 AM EDT To: Reynaldo Minium, PA-C

## 2022-10-18 NOTE — Telephone Encounter (Signed)
I called facility. No on answered for me to speak with  Will attempt again tomorrow.

## 2022-10-21 DIAGNOSIS — R262 Difficulty in walking, not elsewhere classified: Secondary | ICD-10-CM | POA: Diagnosis not present

## 2022-10-21 DIAGNOSIS — R531 Weakness: Secondary | ICD-10-CM | POA: Diagnosis not present

## 2022-10-21 DIAGNOSIS — M179 Osteoarthritis of knee, unspecified: Secondary | ICD-10-CM | POA: Diagnosis not present

## 2022-10-22 DIAGNOSIS — E1159 Type 2 diabetes mellitus with other circulatory complications: Secondary | ICD-10-CM | POA: Diagnosis not present

## 2022-10-22 DIAGNOSIS — E785 Hyperlipidemia, unspecified: Secondary | ICD-10-CM | POA: Diagnosis not present

## 2022-10-22 DIAGNOSIS — E11649 Type 2 diabetes mellitus with hypoglycemia without coma: Secondary | ICD-10-CM | POA: Diagnosis not present

## 2022-10-22 DIAGNOSIS — E1140 Type 2 diabetes mellitus with diabetic neuropathy, unspecified: Secondary | ICD-10-CM | POA: Diagnosis not present

## 2022-10-22 DIAGNOSIS — I152 Hypertension secondary to endocrine disorders: Secondary | ICD-10-CM | POA: Diagnosis not present

## 2022-10-24 DIAGNOSIS — E1140 Type 2 diabetes mellitus with diabetic neuropathy, unspecified: Secondary | ICD-10-CM | POA: Diagnosis not present

## 2022-10-24 DIAGNOSIS — E785 Hyperlipidemia, unspecified: Secondary | ICD-10-CM | POA: Diagnosis not present

## 2022-10-24 DIAGNOSIS — E11649 Type 2 diabetes mellitus with hypoglycemia without coma: Secondary | ICD-10-CM | POA: Diagnosis not present

## 2022-10-24 DIAGNOSIS — E1159 Type 2 diabetes mellitus with other circulatory complications: Secondary | ICD-10-CM | POA: Diagnosis not present

## 2022-10-24 DIAGNOSIS — I152 Hypertension secondary to endocrine disorders: Secondary | ICD-10-CM | POA: Diagnosis not present

## 2022-10-29 ENCOUNTER — Ambulatory Visit: Payer: Medicare HMO | Admitting: Urology

## 2022-10-31 ENCOUNTER — Ambulatory Visit: Payer: PRIVATE HEALTH INSURANCE | Admitting: Urology

## 2022-10-31 DIAGNOSIS — G3184 Mild cognitive impairment, so stated: Secondary | ICD-10-CM | POA: Diagnosis not present

## 2022-10-31 DIAGNOSIS — E1140 Type 2 diabetes mellitus with diabetic neuropathy, unspecified: Secondary | ICD-10-CM | POA: Diagnosis not present

## 2022-10-31 DIAGNOSIS — E785 Hyperlipidemia, unspecified: Secondary | ICD-10-CM | POA: Diagnosis not present

## 2022-10-31 DIAGNOSIS — I152 Hypertension secondary to endocrine disorders: Secondary | ICD-10-CM | POA: Diagnosis not present

## 2022-10-31 DIAGNOSIS — E1159 Type 2 diabetes mellitus with other circulatory complications: Secondary | ICD-10-CM | POA: Diagnosis not present

## 2022-10-31 DIAGNOSIS — I639 Cerebral infarction, unspecified: Secondary | ICD-10-CM | POA: Diagnosis not present

## 2022-10-31 DIAGNOSIS — E11649 Type 2 diabetes mellitus with hypoglycemia without coma: Secondary | ICD-10-CM | POA: Diagnosis not present

## 2022-11-02 DIAGNOSIS — I152 Hypertension secondary to endocrine disorders: Secondary | ICD-10-CM | POA: Diagnosis not present

## 2022-11-02 DIAGNOSIS — E785 Hyperlipidemia, unspecified: Secondary | ICD-10-CM | POA: Diagnosis not present

## 2022-11-02 DIAGNOSIS — E1140 Type 2 diabetes mellitus with diabetic neuropathy, unspecified: Secondary | ICD-10-CM | POA: Diagnosis not present

## 2022-11-02 DIAGNOSIS — E11649 Type 2 diabetes mellitus with hypoglycemia without coma: Secondary | ICD-10-CM | POA: Diagnosis not present

## 2022-11-02 DIAGNOSIS — E1159 Type 2 diabetes mellitus with other circulatory complications: Secondary | ICD-10-CM | POA: Diagnosis not present

## 2022-11-04 DIAGNOSIS — E1159 Type 2 diabetes mellitus with other circulatory complications: Secondary | ICD-10-CM | POA: Diagnosis not present

## 2022-11-04 DIAGNOSIS — I152 Hypertension secondary to endocrine disorders: Secondary | ICD-10-CM | POA: Diagnosis not present

## 2022-11-04 DIAGNOSIS — G9341 Metabolic encephalopathy: Secondary | ICD-10-CM | POA: Diagnosis not present

## 2022-11-04 DIAGNOSIS — E1140 Type 2 diabetes mellitus with diabetic neuropathy, unspecified: Secondary | ICD-10-CM | POA: Diagnosis not present

## 2022-11-04 DIAGNOSIS — M6281 Muscle weakness (generalized): Secondary | ICD-10-CM | POA: Diagnosis not present

## 2022-11-04 DIAGNOSIS — L03317 Cellulitis of buttock: Secondary | ICD-10-CM | POA: Diagnosis not present

## 2022-11-04 DIAGNOSIS — E785 Hyperlipidemia, unspecified: Secondary | ICD-10-CM | POA: Diagnosis not present

## 2022-11-04 DIAGNOSIS — I10 Essential (primary) hypertension: Secondary | ICD-10-CM | POA: Diagnosis not present

## 2022-11-04 DIAGNOSIS — E11649 Type 2 diabetes mellitus with hypoglycemia without coma: Secondary | ICD-10-CM | POA: Diagnosis not present

## 2022-11-05 ENCOUNTER — Ambulatory Visit: Payer: Medicare Other | Admitting: Urology

## 2022-11-06 DIAGNOSIS — I10 Essential (primary) hypertension: Secondary | ICD-10-CM | POA: Diagnosis not present

## 2022-11-06 DIAGNOSIS — E1121 Type 2 diabetes mellitus with diabetic nephropathy: Secondary | ICD-10-CM | POA: Diagnosis not present

## 2022-11-06 DIAGNOSIS — I639 Cerebral infarction, unspecified: Secondary | ICD-10-CM | POA: Diagnosis not present

## 2022-11-06 DIAGNOSIS — B372 Candidiasis of skin and nail: Secondary | ICD-10-CM | POA: Diagnosis not present

## 2022-11-08 ENCOUNTER — Ambulatory Visit (INDEPENDENT_AMBULATORY_CARE_PROVIDER_SITE_OTHER): Payer: Medicare Other | Admitting: Physician Assistant

## 2022-11-08 VITALS — BP 141/74 | HR 68

## 2022-11-08 DIAGNOSIS — R339 Retention of urine, unspecified: Secondary | ICD-10-CM

## 2022-11-08 DIAGNOSIS — B372 Candidiasis of skin and nail: Secondary | ICD-10-CM | POA: Diagnosis not present

## 2022-11-08 LAB — BLADDER SCAN AMB NON-IMAGING: Scan Result: 110

## 2022-11-08 MED ORDER — CLOTRIMAZOLE 1 % EX CREA: 1.0000 | TOPICAL_CREAM | Freq: Two times a day (BID) | CUTANEOUS | 0 refills | Status: DC

## 2022-11-08 NOTE — Progress Notes (Signed)
Assessment: 1. Urinary retention - Bladder Voiding Trial  2. Candidal dermatitis    Plan: Lotrimin for inguinal and vaginal sxs relief/candida. FU this afternoon for PVR and possible foley replacement.   Return visit reveals PVR of 110 mL.  Patient will continue silodosin and follow-up in 1 month for PVR.   Meds ordered this encounter  Medications   clotrimazole (LOTRIMIN) 1 % cream    Sig: Apply 1 Application topically 2 (two) times daily.    Dispense:  40 g    Refill:  0     Chief Complaint: No chief complaint on file.   HPI: [name] is a 88 y.o. female who presents for continued evaluation of urinary retention post hospital stay. Pt has been on silodocin.  Vesicare and trospium held since last visit 1 month ago. No gross hematuria.  No UTIs since last office visit.  Voiding trial performed and patient will follow-up this afternoon for PVR.  10/10/22 [name] is a 88 y.o. female with h/o OAB and UTIs, currently living at [facility], who presents for continued evaluation of OAB, frequency, UUI, and nocturia. Pt has not been seen since 2021.  She was admitted for UTI on August 20, 2022 and discharged to skilled facility.  She is now at [facility] living for assisted living.  Dr. Alyson Ingles consulted during her admission to the hospital recently and gave verbal orders for her to resume Vesicare 5 mg and trospium 60 mg daily.  Since discharge she has been taking these medications, with an actual increase in urgency, frequency, nocturia.  She denies burning, dysuria, gross hematuria.   UA=urine collected after office hours. Will refrigerate urine specimen and run UA in the morning. PVR=993m, pt has no urge to void  Portions of the above documentation were copied from a prior visit for review purposes only.  Allergies: Allergies  Allergen Reactions   Codeine Nausea And Vomiting   Glipizide     Hypoglycemia with glucoses recorded in the  20s necessitating glucagon and D10 IV   Sulfa Antibiotics Other (See Comments)    Unspecified     PMH: Past Medical History:  Diagnosis Date   Diabetes mellitus with neuropathy    Diabetic neuropathy (HCC)    Hypertension    Stroke ([facility]    Urinary incontinence     PSH: Past Surgical History:  Procedure Laterality Date   ABDOMINAL HYSTERECTOMY     APPENDECTOMY     BREAST SURGERY     begnin tumor removed   CATARACT EXTRACTION, BILATERAL      SH: Social History   Tobacco Use   Smoking status: Never   Smokeless tobacco: Never  Vaping Use   Vaping Use: Never used  Substance Use Topics   Alcohol use: No   Drug use: No    ROS: All other review of systems were reviewed and are negative except what is noted above in HPI  [name]: BP (!) 141/74   Pulse 68  GENERAL APPEARANCE:  Well appearing, well developed, well nourished, NAD HEENT:  Atraumatic, normocephalic NECK:  Supple. Trachea midline ABDOMEN:  Soft, non-tender, no masses GU: Examination performed during catheter change with chaperone was present.  Patient's inguinal spaces, genitalia, perineum and inner thighs with well demarcated, erythematous, rated skin changes.  Findings consistent with candidal presence EXTREMITIES:  Moves all extremities  NEUROLOGIC:  Alert and oriented  MENTAL STATUS:  appropriate BACK:  Non-tender to palpation, No CVAT SKIN:  Warm, dry,  and intact   Results: Laboratory Data: Lab Results  Component Value Date   WBC 9.6 08/21/2022   HGB 12.9 08/21/2022   HCT 39.5 08/21/2022   MCV 89.2 08/21/2022   PLT 296 08/21/2022    Lab Results  Component Value Date   CREATININE 0.74 08/21/2022    No results found for: "PSA"  No results found for: "TESTOSTERONE"  Lab Results  Component Value Date   HGBA1C 7.4 (H) 08/31/2022    Urinalysis    Component Value Date/Time   COLORURINE YELLOW 08/19/2022 1424   APPEARANCEUR CLEAR 08/19/2022 1424   APPEARANCEUR Clear 10/25/2021 1450    LABSPEC 1.014 08/19/2022 1424   PHURINE 6.0 08/19/2022 1424   GLUCOSEU NEGATIVE 08/19/2022 1424   HGBUR MODERATE (A) 08/19/2022 1424   BILIRUBINUR NEGATIVE 08/19/2022 1424   BILIRUBINUR Negative 10/25/2021 1450   KETONESUR 20 (A) 08/19/2022 1424   PROTEINUR NEGATIVE 08/19/2022 1424   UROBILINOGEN 0.2 09/06/2009 0135   NITRITE NEGATIVE 08/19/2022 1424   LEUKOCYTESUR NEGATIVE 08/19/2022 1424    Lab Results  Component Value Date   LABMICR See below: 10/25/2021   WBCUA 0-5 10/25/2021   LABEPIT 0-10 10/25/2021   BACTERIA NONE SEEN 08/19/2022    Pertinent Imaging: No results found for this or any previous visit.  No results found for this or any previous visit.  No results found for this or any previous visit.  No results found for this or any previous visit.  No results found for this or any previous visit.  No valid procedures specified. No results found for this or any previous visit.  No results found for this or any previous visit.  No results found for this or any previous visit (from the past 24 hour(s)).

## 2022-11-08 NOTE — Progress Notes (Unsigned)
Fill and Pull Catheter Removal  Patient is present today for a catheter removal.  Patient was cleaned and prepped in a sterile fashion 2571m of sterile water/ saline was instilled into the bladder when the patient felt the urge to urinate. 159mof water was then drained from the balloon.  A 14FR foley cath was removed from the bladder no complications were noted .  Patient as then given some time to void on their own.  Patient cannot void  71m65mn their own after some time.  Patient tolerated well.  Performed by: ShaMarisue BrooklynMA  Follow up/ Additional notes: Follow up today @ 2pm for PVR

## 2022-11-09 ENCOUNTER — Ambulatory Visit: Payer: Medicare Other | Admitting: Urology

## 2022-11-09 DIAGNOSIS — E785 Hyperlipidemia, unspecified: Secondary | ICD-10-CM | POA: Diagnosis not present

## 2022-11-09 DIAGNOSIS — E039 Hypothyroidism, unspecified: Secondary | ICD-10-CM | POA: Diagnosis not present

## 2022-11-09 DIAGNOSIS — E1159 Type 2 diabetes mellitus with other circulatory complications: Secondary | ICD-10-CM | POA: Diagnosis not present

## 2022-11-09 DIAGNOSIS — E1140 Type 2 diabetes mellitus with diabetic neuropathy, unspecified: Secondary | ICD-10-CM | POA: Diagnosis not present

## 2022-11-09 DIAGNOSIS — E11649 Type 2 diabetes mellitus with hypoglycemia without coma: Secondary | ICD-10-CM | POA: Diagnosis not present

## 2022-11-09 DIAGNOSIS — I152 Hypertension secondary to endocrine disorders: Secondary | ICD-10-CM | POA: Diagnosis not present

## 2022-11-09 DIAGNOSIS — D519 Vitamin B12 deficiency anemia, unspecified: Secondary | ICD-10-CM | POA: Diagnosis not present

## 2022-11-09 DIAGNOSIS — Z79899 Other long term (current) drug therapy: Secondary | ICD-10-CM | POA: Diagnosis not present

## 2022-11-09 DIAGNOSIS — E559 Vitamin D deficiency, unspecified: Secondary | ICD-10-CM | POA: Diagnosis not present

## 2022-11-12 DIAGNOSIS — I639 Cerebral infarction, unspecified: Secondary | ICD-10-CM | POA: Diagnosis not present

## 2022-11-12 DIAGNOSIS — J9620 Acute and chronic respiratory failure, unspecified whether with hypoxia or hypercapnia: Secondary | ICD-10-CM | POA: Diagnosis not present

## 2022-11-13 DIAGNOSIS — I152 Hypertension secondary to endocrine disorders: Secondary | ICD-10-CM | POA: Diagnosis not present

## 2022-11-13 DIAGNOSIS — E785 Hyperlipidemia, unspecified: Secondary | ICD-10-CM | POA: Diagnosis not present

## 2022-11-13 DIAGNOSIS — E1140 Type 2 diabetes mellitus with diabetic neuropathy, unspecified: Secondary | ICD-10-CM | POA: Diagnosis not present

## 2022-11-13 DIAGNOSIS — L03314 Cellulitis of groin: Secondary | ICD-10-CM | POA: Diagnosis not present

## 2022-11-13 DIAGNOSIS — E1159 Type 2 diabetes mellitus with other circulatory complications: Secondary | ICD-10-CM | POA: Diagnosis not present

## 2022-11-13 DIAGNOSIS — B372 Candidiasis of skin and nail: Secondary | ICD-10-CM | POA: Diagnosis not present

## 2022-11-13 DIAGNOSIS — E11649 Type 2 diabetes mellitus with hypoglycemia without coma: Secondary | ICD-10-CM | POA: Diagnosis not present

## 2022-11-19 DIAGNOSIS — E11649 Type 2 diabetes mellitus with hypoglycemia without coma: Secondary | ICD-10-CM | POA: Diagnosis not present

## 2022-11-19 DIAGNOSIS — I152 Hypertension secondary to endocrine disorders: Secondary | ICD-10-CM | POA: Diagnosis not present

## 2022-11-19 DIAGNOSIS — E1159 Type 2 diabetes mellitus with other circulatory complications: Secondary | ICD-10-CM | POA: Diagnosis not present

## 2022-11-19 DIAGNOSIS — E785 Hyperlipidemia, unspecified: Secondary | ICD-10-CM | POA: Diagnosis not present

## 2022-11-19 DIAGNOSIS — E1140 Type 2 diabetes mellitus with diabetic neuropathy, unspecified: Secondary | ICD-10-CM | POA: Diagnosis not present

## 2022-11-21 DIAGNOSIS — R262 Difficulty in walking, not elsewhere classified: Secondary | ICD-10-CM | POA: Diagnosis not present

## 2022-11-21 DIAGNOSIS — R531 Weakness: Secondary | ICD-10-CM | POA: Diagnosis not present

## 2022-11-21 DIAGNOSIS — M179 Osteoarthritis of knee, unspecified: Secondary | ICD-10-CM | POA: Diagnosis not present

## 2022-11-22 DIAGNOSIS — E782 Mixed hyperlipidemia: Secondary | ICD-10-CM | POA: Diagnosis not present

## 2022-11-23 DIAGNOSIS — G3184 Mild cognitive impairment, so stated: Secondary | ICD-10-CM | POA: Diagnosis not present

## 2022-11-23 DIAGNOSIS — I639 Cerebral infarction, unspecified: Secondary | ICD-10-CM | POA: Diagnosis not present

## 2022-11-26 DIAGNOSIS — E11649 Type 2 diabetes mellitus with hypoglycemia without coma: Secondary | ICD-10-CM | POA: Diagnosis not present

## 2022-11-26 DIAGNOSIS — E785 Hyperlipidemia, unspecified: Secondary | ICD-10-CM | POA: Diagnosis not present

## 2022-11-26 DIAGNOSIS — E1140 Type 2 diabetes mellitus with diabetic neuropathy, unspecified: Secondary | ICD-10-CM | POA: Diagnosis not present

## 2022-11-26 DIAGNOSIS — I152 Hypertension secondary to endocrine disorders: Secondary | ICD-10-CM | POA: Diagnosis not present

## 2022-11-26 DIAGNOSIS — E1159 Type 2 diabetes mellitus with other circulatory complications: Secondary | ICD-10-CM | POA: Diagnosis not present

## 2022-12-04 DIAGNOSIS — E1140 Type 2 diabetes mellitus with diabetic neuropathy, unspecified: Secondary | ICD-10-CM | POA: Diagnosis not present

## 2022-12-04 DIAGNOSIS — E1159 Type 2 diabetes mellitus with other circulatory complications: Secondary | ICD-10-CM | POA: Diagnosis not present

## 2022-12-04 DIAGNOSIS — M6281 Muscle weakness (generalized): Secondary | ICD-10-CM | POA: Diagnosis not present

## 2022-12-04 DIAGNOSIS — G9341 Metabolic encephalopathy: Secondary | ICD-10-CM | POA: Diagnosis not present

## 2022-12-04 DIAGNOSIS — E1121 Type 2 diabetes mellitus with diabetic nephropathy: Secondary | ICD-10-CM | POA: Diagnosis not present

## 2022-12-04 DIAGNOSIS — I10 Essential (primary) hypertension: Secondary | ICD-10-CM | POA: Diagnosis not present

## 2022-12-04 DIAGNOSIS — I152 Hypertension secondary to endocrine disorders: Secondary | ICD-10-CM | POA: Diagnosis not present

## 2022-12-04 DIAGNOSIS — L03317 Cellulitis of buttock: Secondary | ICD-10-CM | POA: Diagnosis not present

## 2022-12-04 DIAGNOSIS — E11649 Type 2 diabetes mellitus with hypoglycemia without coma: Secondary | ICD-10-CM | POA: Diagnosis not present

## 2022-12-04 DIAGNOSIS — Z8673 Personal history of transient ischemic attack (TIA), and cerebral infarction without residual deficits: Secondary | ICD-10-CM | POA: Diagnosis not present

## 2022-12-04 DIAGNOSIS — B372 Candidiasis of skin and nail: Secondary | ICD-10-CM | POA: Diagnosis not present

## 2022-12-04 DIAGNOSIS — E785 Hyperlipidemia, unspecified: Secondary | ICD-10-CM | POA: Diagnosis not present

## 2022-12-05 ENCOUNTER — Ambulatory Visit: Payer: Medicare Other | Admitting: Urology

## 2022-12-12 DIAGNOSIS — J9620 Acute and chronic respiratory failure, unspecified whether with hypoxia or hypercapnia: Secondary | ICD-10-CM | POA: Diagnosis not present

## 2022-12-12 DIAGNOSIS — I639 Cerebral infarction, unspecified: Secondary | ICD-10-CM | POA: Diagnosis not present

## 2022-12-20 DIAGNOSIS — E782 Mixed hyperlipidemia: Secondary | ICD-10-CM | POA: Diagnosis not present

## 2022-12-21 DIAGNOSIS — R262 Difficulty in walking, not elsewhere classified: Secondary | ICD-10-CM | POA: Diagnosis not present

## 2022-12-21 DIAGNOSIS — R531 Weakness: Secondary | ICD-10-CM | POA: Diagnosis not present

## 2022-12-21 DIAGNOSIS — M179 Osteoarthritis of knee, unspecified: Secondary | ICD-10-CM | POA: Diagnosis not present

## 2022-12-26 DIAGNOSIS — I639 Cerebral infarction, unspecified: Secondary | ICD-10-CM | POA: Diagnosis not present

## 2022-12-26 DIAGNOSIS — G3184 Mild cognitive impairment, so stated: Secondary | ICD-10-CM | POA: Diagnosis not present

## 2022-12-28 DIAGNOSIS — G4700 Insomnia, unspecified: Secondary | ICD-10-CM | POA: Diagnosis not present

## 2022-12-28 DIAGNOSIS — R627 Adult failure to thrive: Secondary | ICD-10-CM | POA: Diagnosis not present

## 2022-12-28 DIAGNOSIS — Z7401 Bed confinement status: Secondary | ICD-10-CM | POA: Diagnosis not present

## 2022-12-31 ENCOUNTER — Encounter (HOSPITAL_COMMUNITY)
Admission: EM | Disposition: A | Discharge status: Nursing Home (Includes ICF) | Payer: Self-pay | Source: Skilled Nursing Facility | Attending: Cardiovascular Disease

## 2022-12-31 ENCOUNTER — Inpatient Hospital Stay (HOSPITAL_COMMUNITY)
Admission: EM | Admit: 2022-12-31 | Discharge: 2023-01-04 | DRG: 322 | Disposition: A | Discharge status: Nursing Home (Includes ICF) | Payer: Medicare Other | Source: Skilled Nursing Facility | Attending: Cardiovascular Disease | Admitting: Cardiovascular Disease

## 2022-12-31 ENCOUNTER — Inpatient Hospital Stay (HOSPITAL_COMMUNITY): Payer: Medicare Other

## 2022-12-31 DIAGNOSIS — Z882 Allergy status to sulfonamides status: Secondary | ICD-10-CM

## 2022-12-31 DIAGNOSIS — I4510 Unspecified right bundle-branch block: Secondary | ICD-10-CM | POA: Diagnosis not present

## 2022-12-31 DIAGNOSIS — R269 Unspecified abnormalities of gait and mobility: Secondary | ICD-10-CM | POA: Diagnosis not present

## 2022-12-31 DIAGNOSIS — R079 Chest pain, unspecified: Secondary | ICD-10-CM | POA: Diagnosis not present

## 2022-12-31 DIAGNOSIS — L89301 Pressure ulcer of unspecified buttock, stage 1: Secondary | ICD-10-CM | POA: Diagnosis not present

## 2022-12-31 DIAGNOSIS — Z885 Allergy status to narcotic agent status: Secondary | ICD-10-CM

## 2022-12-31 DIAGNOSIS — D72829 Elevated white blood cell count, unspecified: Secondary | ICD-10-CM | POA: Diagnosis not present

## 2022-12-31 DIAGNOSIS — Z743 Need for continuous supervision: Secondary | ICD-10-CM | POA: Diagnosis not present

## 2022-12-31 DIAGNOSIS — E876 Hypokalemia: Secondary | ICD-10-CM | POA: Diagnosis present

## 2022-12-31 DIAGNOSIS — I10 Essential (primary) hypertension: Secondary | ICD-10-CM | POA: Diagnosis not present

## 2022-12-31 DIAGNOSIS — I2510 Atherosclerotic heart disease of native coronary artery without angina pectoris: Secondary | ICD-10-CM

## 2022-12-31 DIAGNOSIS — Z79899 Other long term (current) drug therapy: Secondary | ICD-10-CM | POA: Diagnosis not present

## 2022-12-31 DIAGNOSIS — Z8673 Personal history of transient ischemic attack (TIA), and cerebral infarction without residual deficits: Secondary | ICD-10-CM

## 2022-12-31 DIAGNOSIS — Z809 Family history of malignant neoplasm, unspecified: Secondary | ICD-10-CM

## 2022-12-31 DIAGNOSIS — D649 Anemia, unspecified: Secondary | ICD-10-CM | POA: Diagnosis not present

## 2022-12-31 DIAGNOSIS — Z823 Family history of stroke: Secondary | ICD-10-CM

## 2022-12-31 DIAGNOSIS — Z794 Long term (current) use of insulin: Secondary | ICD-10-CM

## 2022-12-31 DIAGNOSIS — L03317 Cellulitis of buttock: Secondary | ICD-10-CM | POA: Diagnosis not present

## 2022-12-31 DIAGNOSIS — R293 Abnormal posture: Secondary | ICD-10-CM | POA: Diagnosis not present

## 2022-12-31 DIAGNOSIS — I071 Rheumatic tricuspid insufficiency: Secondary | ICD-10-CM | POA: Diagnosis not present

## 2022-12-31 DIAGNOSIS — E1165 Type 2 diabetes mellitus with hyperglycemia: Secondary | ICD-10-CM | POA: Diagnosis present

## 2022-12-31 DIAGNOSIS — I2119 ST elevation (STEMI) myocardial infarction involving other coronary artery of inferior wall: Secondary | ICD-10-CM | POA: Diagnosis not present

## 2022-12-31 DIAGNOSIS — I25810 Atherosclerosis of coronary artery bypass graft(s) without angina pectoris: Secondary | ICD-10-CM | POA: Insufficient documentation

## 2022-12-31 DIAGNOSIS — Z7401 Bed confinement status: Secondary | ICD-10-CM | POA: Diagnosis not present

## 2022-12-31 DIAGNOSIS — J439 Emphysema, unspecified: Secondary | ICD-10-CM | POA: Diagnosis not present

## 2022-12-31 DIAGNOSIS — Z7982 Long term (current) use of aspirin: Secondary | ICD-10-CM | POA: Diagnosis not present

## 2022-12-31 DIAGNOSIS — Z7984 Long term (current) use of oral hypoglycemic drugs: Secondary | ICD-10-CM

## 2022-12-31 DIAGNOSIS — Z888 Allergy status to other drugs, medicaments and biological substances status: Secondary | ICD-10-CM

## 2022-12-31 DIAGNOSIS — E785 Hyperlipidemia, unspecified: Secondary | ICD-10-CM | POA: Diagnosis present

## 2022-12-31 DIAGNOSIS — F0390 Unspecified dementia without behavioral disturbance: Secondary | ICD-10-CM | POA: Diagnosis present

## 2022-12-31 DIAGNOSIS — E119 Type 2 diabetes mellitus without complications: Secondary | ICD-10-CM | POA: Diagnosis not present

## 2022-12-31 DIAGNOSIS — R6889 Other general symptoms and signs: Secondary | ICD-10-CM | POA: Diagnosis not present

## 2022-12-31 DIAGNOSIS — E1140 Type 2 diabetes mellitus with diabetic neuropathy, unspecified: Secondary | ICD-10-CM | POA: Diagnosis not present

## 2022-12-31 DIAGNOSIS — I499 Cardiac arrhythmia, unspecified: Secondary | ICD-10-CM | POA: Diagnosis not present

## 2022-12-31 DIAGNOSIS — L8990 Pressure ulcer of unspecified site, unspecified stage: Secondary | ICD-10-CM | POA: Insufficient documentation

## 2022-12-31 DIAGNOSIS — Z8249 Family history of ischemic heart disease and other diseases of the circulatory system: Secondary | ICD-10-CM

## 2022-12-31 DIAGNOSIS — M6281 Muscle weakness (generalized): Secondary | ICD-10-CM | POA: Diagnosis not present

## 2022-12-31 DIAGNOSIS — I358 Other nonrheumatic aortic valve disorders: Secondary | ICD-10-CM | POA: Diagnosis present

## 2022-12-31 DIAGNOSIS — Z955 Presence of coronary angioplasty implant and graft: Secondary | ICD-10-CM

## 2022-12-31 DIAGNOSIS — I2111 ST elevation (STEMI) myocardial infarction involving right coronary artery: Secondary | ICD-10-CM | POA: Diagnosis not present

## 2022-12-31 DIAGNOSIS — I2489 Other forms of acute ischemic heart disease: Secondary | ICD-10-CM | POA: Diagnosis not present

## 2022-12-31 DIAGNOSIS — R5381 Other malaise: Secondary | ICD-10-CM | POA: Diagnosis not present

## 2022-12-31 DIAGNOSIS — I213 ST elevation (STEMI) myocardial infarction of unspecified site: Secondary | ICD-10-CM | POA: Diagnosis not present

## 2022-12-31 DIAGNOSIS — I493 Ventricular premature depolarization: Secondary | ICD-10-CM | POA: Diagnosis not present

## 2022-12-31 DIAGNOSIS — G9341 Metabolic encephalopathy: Secondary | ICD-10-CM | POA: Diagnosis not present

## 2022-12-31 DIAGNOSIS — I700 Atherosclerosis of aorta: Secondary | ICD-10-CM | POA: Diagnosis not present

## 2022-12-31 DIAGNOSIS — M179 Osteoarthritis of knee, unspecified: Secondary | ICD-10-CM | POA: Diagnosis not present

## 2022-12-31 DIAGNOSIS — I6990 Unspecified sequelae of unspecified cerebrovascular disease: Secondary | ICD-10-CM | POA: Diagnosis not present

## 2022-12-31 HISTORY — PX: CORONARY/GRAFT ACUTE MI REVASCULARIZATION: CATH118305

## 2022-12-31 HISTORY — PX: CORONARY ANGIOGRAPHY: CATH118303

## 2022-12-31 LAB — COMPREHENSIVE METABOLIC PANEL
ALT: 24 U/L (ref 0–44)
AST: 89 U/L — ABNORMAL HIGH (ref 15–41)
Albumin: 3 g/dL — ABNORMAL LOW (ref 3.5–5.0)
Alkaline Phosphatase: 76 U/L (ref 38–126)
Anion gap: 10 (ref 5–15)
BUN: 16 mg/dL (ref 8–23)
CO2: 22 mmol/L (ref 22–32)
Calcium: 9.1 mg/dL (ref 8.9–10.3)
Chloride: 102 mmol/L (ref 98–111)
Creatinine, Ser: 0.74 mg/dL (ref 0.44–1.00)
GFR, Estimated: >60 mL/min (ref >60)
Glucose, Bld: 222 mg/dL — ABNORMAL HIGH (ref 70–99)
Potassium: 3.6 mmol/L (ref 3.5–5.1)
Sodium: 134 mmol/L — ABNORMAL LOW (ref 135–145)
Total Bilirubin: 0.8 mg/dL (ref 0.3–1.2)
Total Protein: 6 g/dL — ABNORMAL LOW (ref 6.5–8.1)

## 2022-12-31 LAB — CBC WITH DIFFERENTIAL/PLATELET
Abs Immature Granulocytes: 0.04 10*3/uL (ref 0.00–0.07)
Basophils Absolute: 0 10*3/uL (ref 0.0–0.1)
Basophils Relative: 0 %
Eosinophils Absolute: 0 10*3/uL (ref 0.0–0.5)
Eosinophils Relative: 0 %
HCT: 36.8 % (ref 36.0–46.0)
Hemoglobin: 12.2 g/dL (ref 12.0–15.0)
Immature Granulocytes: 0 %
Lymphocytes Relative: 10 %
Lymphs Abs: 1.1 10*3/uL (ref 0.7–4.0)
MCH: 29 pg (ref 26.0–34.0)
MCHC: 33.2 g/dL (ref 30.0–36.0)
MCV: 87.4 fL (ref 80.0–100.0)
Monocytes Absolute: 0.7 10*3/uL (ref 0.1–1.0)
Monocytes Relative: 7 %
Neutro Abs: 9.1 10*3/uL — ABNORMAL HIGH (ref 1.7–7.7)
Neutrophils Relative %: 83 %
Platelets: 233 10*3/uL (ref 150–400)
RBC: 4.21 MIL/uL (ref 3.87–5.11)
RDW: 13.2 % (ref 11.5–15.5)
WBC: 11 10*3/uL — ABNORMAL HIGH (ref 4.0–10.5)
nRBC: 0 % (ref 0.0–0.2)

## 2022-12-31 LAB — BRAIN NATRIURETIC PEPTIDE: B Natriuretic Peptide: 510 pg/mL — ABNORMAL HIGH (ref 0.0–100.0)

## 2022-12-31 LAB — TSH: TSH: 2.673 u[IU]/mL (ref 0.350–4.500)

## 2022-12-31 LAB — TROPONIN I (HIGH SENSITIVITY): Troponin I (High Sensitivity): >24000 ng/L (ref <18)

## 2022-12-31 LAB — POCT I-STAT, CHEM 8
BUN: 17 mg/dL (ref 8–23)
Calcium, Ion: 1.34 mmol/L (ref 1.15–1.40)
Chloride: 102 mmol/L (ref 98–111)
Creatinine, Ser: 0.6 mg/dL (ref 0.44–1.00)
Glucose, Bld: 228 mg/dL — ABNORMAL HIGH (ref 70–99)
HCT: 40 % (ref 36.0–46.0)
Hemoglobin: 13.6 g/dL (ref 12.0–15.0)
Potassium: 4.1 mmol/L (ref 3.5–5.1)
Sodium: 137 mmol/L (ref 135–145)
TCO2: 23 mmol/L (ref 22–32)

## 2022-12-31 LAB — POCT ACTIVATED CLOTTING TIME
Activated Clotting Time: 277 seconds
Activated Clotting Time: 276 seconds
Activated Clotting Time: 315 seconds
Activated Clotting Time: 190 seconds
Activated Clotting Time: 147 seconds

## 2022-12-31 LAB — MRSA NEXT GEN BY PCR, NASAL: MRSA by PCR Next Gen: DETECTED — AB

## 2022-12-31 LAB — HEMOGLOBIN A1C
Hgb A1c MFr Bld: 7.2 % — ABNORMAL HIGH (ref 4.8–5.6)
Mean Plasma Glucose: 159.94 mg/dL

## 2022-12-31 SURGERY — CORONARY/GRAFT ACUTE MI REVASCULARIZATION
Anesthesia: LOCAL

## 2022-12-31 MED ORDER — ENOXAPARIN SODIUM 40 MG/0.4ML IJ SOSY: 40.0000 mg | PREFILLED_SYRINGE | INTRAMUSCULAR | Status: DC

## 2022-12-31 MED ORDER — METOPROLOL TARTRATE 12.5 MG HALF TABLET
12.5000 mg | ORAL_TABLET | Freq: Two times a day (BID) | ORAL | Status: DC
  Administered 2023-01-01: 12.5 mg via ORAL
  Filled 2022-12-31: qty 1

## 2022-12-31 MED ORDER — VERAPAMIL HCL 2.5 MG/ML IV SOLN
INTRAVENOUS | Status: DC | PRN
  Administered 2022-12-31: 10 mL via INTRA_ARTERIAL

## 2022-12-31 MED ORDER — TICAGRELOR 90 MG PO TABS: 90.0000 mg | ORAL_TABLET | Freq: Two times a day (BID) | ORAL | Status: DC

## 2022-12-31 MED ORDER — ROSUVASTATIN CALCIUM 20 MG PO TABS
20.0000 mg | ORAL_TABLET | Freq: Every day | ORAL | Status: DC
  Administered 2022-12-31 – 2023-01-04 (×5): 20 mg via ORAL
  Filled 2022-12-31 (×5): qty 1

## 2022-12-31 MED ORDER — NITROGLYCERIN 1 MG/10 ML FOR IR/CATH LAB
INTRA_ARTERIAL | Status: AC
  Filled 2022-12-31: qty 10

## 2022-12-31 MED ORDER — TICAGRELOR 90 MG PO TABS
90.0000 mg | ORAL_TABLET | Freq: Two times a day (BID) | ORAL | Status: DC
  Administered 2023-01-01 – 2023-01-04 (×7): 90 mg via ORAL
  Filled 2022-12-31 (×7): qty 1

## 2022-12-31 MED ORDER — SODIUM CHLORIDE 0.9% FLUSH
3.0000 mL | Freq: Two times a day (BID) | INTRAVENOUS | Status: DC
  Administered 2023-01-01 – 2023-01-04 (×7): 3 mL via INTRAVENOUS

## 2022-12-31 MED ORDER — VERAPAMIL HCL 2.5 MG/ML IV SOLN
INTRAVENOUS | Status: AC
  Filled 2022-12-31: qty 2

## 2022-12-31 MED ORDER — HEPARIN SODIUM (PORCINE) 5000 UNIT/ML IJ SOLN
5000.0000 [IU] | Freq: Three times a day (TID) | INTRAMUSCULAR | Status: DC
  Administered 2023-01-01 – 2023-01-04 (×11): 5000 [IU] via SUBCUTANEOUS
  Filled 2022-12-31 (×11): qty 1

## 2022-12-31 MED ORDER — METOPROLOL SUCCINATE ER 50 MG PO TB24: 50.0000 mg | ORAL_TABLET | Freq: Every day | ORAL | Status: DC

## 2022-12-31 MED ORDER — INSULIN GLARGINE-YFGN 100 UNIT/ML ~~LOC~~ SOLN
20.0000 [IU] | Freq: Every day | SUBCUTANEOUS | Status: DC
  Administered 2022-12-31 – 2023-01-03 (×4): 20 [IU] via SUBCUTANEOUS
  Filled 2022-12-31 (×5): qty 0.2

## 2022-12-31 MED ORDER — ACETAMINOPHEN 325 MG PO TABS
650.0000 mg | ORAL_TABLET | ORAL | Status: DC | PRN
  Administered 2023-01-02: 650 mg via ORAL
  Filled 2022-12-31: qty 2

## 2022-12-31 MED ORDER — NOREPINEPHRINE 4 MG/250ML-% IV SOLN: 0.0000 ug/min | INTRAVENOUS | Status: DC

## 2022-12-31 MED ORDER — NOREPINEPHRINE 4 MG/250ML-% IV SOLN
INTRAVENOUS | Status: AC
  Filled 2022-12-31: qty 250

## 2022-12-31 MED ORDER — INSULIN ASPART 100 UNIT/ML IJ SOLN
0.0000 [IU] | Freq: Three times a day (TID) | INTRAMUSCULAR | Status: DC
  Administered 2023-01-01 (×2): 3 [IU] via SUBCUTANEOUS
  Administered 2023-01-03: 2 [IU] via SUBCUTANEOUS

## 2022-12-31 MED ORDER — TICAGRELOR 90 MG PO TABS
ORAL_TABLET | ORAL | Status: AC
  Filled 2022-12-31: qty 2

## 2022-12-31 MED ORDER — HEPARIN (PORCINE) IN NACL 1000-0.9 UT/500ML-% IV SOLN
INTRAVENOUS | Status: DC | PRN
  Administered 2022-12-31 (×2): 500 mL

## 2022-12-31 MED ORDER — NITROGLYCERIN 0.4 MG SL SUBL
0.4000 mg | SUBLINGUAL_TABLET | SUBLINGUAL | Status: DC | PRN
  Administered 2022-12-31: 0.4 mg via SUBLINGUAL
  Filled 2022-12-31: qty 1

## 2022-12-31 MED ORDER — SODIUM CHLORIDE 0.9 % IV SOLN: INTRAVENOUS | Status: AC

## 2022-12-31 MED ORDER — ASPIRIN 81 MG PO TBEC: 81.0000 mg | DELAYED_RELEASE_TABLET | Freq: Every day | ORAL | Status: DC

## 2022-12-31 MED ORDER — CHLORHEXIDINE GLUCONATE CLOTH 2 % EX PADS
6.0000 | MEDICATED_PAD | Freq: Every day | CUTANEOUS | Status: DC
  Administered 2023-01-01: 6 via TOPICAL

## 2022-12-31 MED ORDER — SODIUM CHLORIDE 0.9% FLUSH: 3.0000 mL | INTRAVENOUS | Status: DC | PRN

## 2022-12-31 MED ORDER — HEPARIN SODIUM (PORCINE) 1000 UNIT/ML IJ SOLN
INTRAMUSCULAR | Status: DC | PRN
  Administered 2022-12-31: 7000 [IU] via INTRAVENOUS
  Administered 2022-12-31: 2000 [IU] via INTRAVENOUS

## 2022-12-31 MED ORDER — HEPARIN (PORCINE) IN NACL 1000-0.9 UT/500ML-% IV SOLN
INTRAVENOUS | Status: AC
  Filled 2022-12-31: qty 500

## 2022-12-31 MED ORDER — FENTANYL CITRATE (PF) 100 MCG/2ML IJ SOLN
INTRAMUSCULAR | Status: AC
  Filled 2022-12-31: qty 2

## 2022-12-31 MED ORDER — SODIUM CHLORIDE 0.9 % IV SOLN: 250.0000 mL | INTRAVENOUS | Status: DC | PRN

## 2022-12-31 MED ORDER — LIDOCAINE HCL (PF) 1 % IJ SOLN
INTRAMUSCULAR | Status: DC | PRN
  Administered 2022-12-31: 15 mL
  Administered 2022-12-31: 2 mL

## 2022-12-31 MED ORDER — NITROGLYCERIN 1 MG/10 ML FOR IR/CATH LAB
INTRA_ARTERIAL | Status: DC | PRN
  Administered 2022-12-31 (×2): 200 ug via INTRACORONARY

## 2022-12-31 MED ORDER — CHLORHEXIDINE GLUCONATE CLOTH 2 % EX PADS
6.0000 | MEDICATED_PAD | Freq: Every day | CUTANEOUS | Status: DC
  Administered 2023-01-01 – 2023-01-04 (×4): 6 via TOPICAL

## 2022-12-31 MED ORDER — TICAGRELOR 90 MG PO TABS
ORAL_TABLET | ORAL | Status: DC | PRN
  Administered 2022-12-31: 180 mg via ORAL

## 2022-12-31 MED ORDER — FENTANYL CITRATE (PF) 100 MCG/2ML IJ SOLN
INTRAMUSCULAR | Status: DC | PRN
  Administered 2022-12-31: 12.5 ug via INTRAVENOUS

## 2022-12-31 MED ORDER — LABETALOL HCL 5 MG/ML IV SOLN: 10.0000 mg | INTRAVENOUS | Status: DC | PRN

## 2022-12-31 MED ORDER — HYDRALAZINE HCL 20 MG/ML IJ SOLN
10.0000 mg | INTRAMUSCULAR | Status: AC | PRN
  Administered 2022-12-31: 10 mg via INTRAVENOUS
  Filled 2022-12-31 (×2): qty 1

## 2022-12-31 MED ORDER — INSULIN GLARGINE-YFGN 100 UNIT/ML ~~LOC~~ SOPN
20.0000 [IU] | PEN_INJECTOR | Freq: Every day | SUBCUTANEOUS | Status: DC
  Filled 2022-12-31: qty 3

## 2022-12-31 MED ORDER — ASPIRIN 81 MG PO CHEW
81.0000 mg | CHEWABLE_TABLET | Freq: Every day | ORAL | Status: DC
  Administered 2023-01-01 – 2023-01-04 (×4): 81 mg via ORAL
  Filled 2022-12-31 (×4): qty 1

## 2022-12-31 MED ORDER — ATROPINE SULFATE 1 MG/10ML IJ SOSY
PREFILLED_SYRINGE | INTRAMUSCULAR | Status: AC
  Filled 2022-12-31: qty 10

## 2022-12-31 MED ORDER — ONDANSETRON HCL 4 MG/2ML IJ SOLN
4.0000 mg | Freq: Four times a day (QID) | INTRAMUSCULAR | Status: DC | PRN
  Filled 2022-12-31: qty 2

## 2022-12-31 MED ORDER — HEPARIN SODIUM (PORCINE) 1000 UNIT/ML IJ SOLN
INTRAMUSCULAR | Status: AC
  Filled 2022-12-31: qty 10

## 2022-12-31 MED ORDER — MUPIROCIN 2 % EX OINT
1.0000 | TOPICAL_OINTMENT | Freq: Two times a day (BID) | CUTANEOUS | Status: DC
  Administered 2022-12-31 – 2023-01-04 (×8): 1 via NASAL
  Filled 2022-12-31 (×3): qty 22

## 2022-12-31 MED ORDER — IOHEXOL 350 MG/ML SOLN
INTRAVENOUS | Status: DC | PRN
  Administered 2022-12-31: 145 mL

## 2022-12-31 MED ORDER — LIDOCAINE HCL (PF) 1 % IJ SOLN
INTRAMUSCULAR | Status: AC
  Filled 2022-12-31: qty 30

## 2022-12-31 SURGICAL SUPPLY — 31 items
BALL SAPPHIRE NC24 2.75X15 (BALLOONS) ×1
BALLN EMERGE MR 2.0X12 (BALLOONS) ×1
BALLN EMERGE MR 2.25X12 (BALLOONS) ×1
BALLN EMERGE MR PUSH 1.5X12 (BALLOONS) ×1
BALLN SCOREFLEX 2.50X10 (BALLOONS) ×1
BALLOON EMERGE MR 2.0X12 (BALLOONS) IMPLANT
BALLOON EMERGE MR 2.25X12 (BALLOONS) IMPLANT
BALLOON EMERGE MR PUSH 1.5X12 (BALLOONS) IMPLANT
BALLOON SAPPHIRE NC24 2.75X15 (BALLOONS) IMPLANT
BALLOON SCOREFLEX 2.50X10 (BALLOONS) IMPLANT
BALLOON TAKERU 1.5X12 (BALLOONS) IMPLANT
CATH 5FR JL3.5 JR4 ANG PIG MP (CATHETERS) IMPLANT
CATH LAUNCHER 6FR JR4 (CATHETERS) IMPLANT
ELECT DEFIB PAD ADLT CADENCE (PAD) IMPLANT
GLIDESHEATH SLEND SS 6F .021 (SHEATH) IMPLANT
GUIDEWIRE ANGLED .035X150CM (WIRE) IMPLANT
GUIDEWIRE INQWIRE 1.5J.035X260 (WIRE) IMPLANT
INQWIRE 1.5J .035X260CM (WIRE) ×1
KIT ENCORE 26 ADVANTAGE (KITS) IMPLANT
KIT HEART LEFT (KITS) ×1 IMPLANT
KIT MICROPUNCTURE NIT STIFF (SHEATH) IMPLANT
PACK CARDIAC CATHETERIZATION (CUSTOM PROCEDURE TRAY) ×1 IMPLANT
SHEATH PINNACLE 6F 10CM (SHEATH) IMPLANT
SHEATH PROBE COVER 6X72 (BAG) IMPLANT
STENT SYNERGY XD 2.50X20 (Permanent Stent) IMPLANT
SYNERGY XD 2.50X20 (Permanent Stent) ×1 IMPLANT
TRANSDUCER W/STOPCOCK (MISCELLANEOUS) ×1 IMPLANT
TUBING CIL FLEX 10 FLL-RA (TUBING) ×1 IMPLANT
WIRE ASAHI GRAND SLAM 180CM (WIRE) IMPLANT
WIRE HI TORQ VERSACORE-J 145CM (WIRE) IMPLANT
WIRE RUNTHROUGH .014X180CM (WIRE) IMPLANT

## 2022-12-31 NOTE — H&P (Signed)
Cardiology Admission History and Physical   Patient ID: [name] MRN: [id number]; DOB: 1929-02-23   Admission date: 12/31/2022  PCP:  Sharilyn Sites, MD   [place] Providers Cardiologist:  None        Chief Complaint:  Chest pain  Patient Profile:   [name] is a 89 y.o. female with no past cardiac history who is being seen 12/31/2022 for the evaluation of chest pain/STEMI.  History of Present Illness:   [name] is transported via EMS with acute inferior STEMI.  She lives at an assisted living facility with her husband.  The patient reports that she is hoping to get back home in the near future.  However, last night she developed epigastric and substernal discomfort that she describes as a burning and indigestion-like sensation.  Symptoms persisted throughout the night and intensified this morning.  She took Tums this morning with no relief.  She notified staff at [facility] and EMS was called.  Upon their evaluation, the patient had an inferior STEMI pattern on EKG and they activated a code STEMI.  She is transported directly to [facility] for further evaluation and treatment of STEMI.  At the time of my evaluation, the patient continues to have substernal chest discomfort but she states it is improved.  She rates it at a 3/10 in intensity.  There is no associated shortness of breath, diaphoresis, or syncope.  She does complain of nausea but has not had vomiting.  The patient's past history includes diabetes and stroke.  She states that she has primarily in a wheelchair, but is able to take care of herself.   Past Medical History:  Diagnosis Date   Diabetes mellitus with neuropathy    Diabetic neuropathy ([place])    Hypertension    Stroke [facility])    Urinary incontinence     Past Surgical History:  Procedure Laterality Date   ABDOMINAL HYSTERECTOMY     APPENDECTOMY     BREAST SURGERY     begnin tumor removed   CATARACT EXTRACTION,  BILATERAL       Medications Prior to Admission: Prior to Admission medications   Medication Sig Start Date End Date Taking? Authorizing Provider  amLODipine (NORVASC) 5 MG tablet Take 1 tablet (5 mg total) by mouth daily. 07/25/22   Gerlene Fee, NP  aspirin 325 MG tablet Take 1 tablet (325 mg total) by mouth daily. 03/02/20   Barton Dubois, MD  blood glucose meter kit and supplies KIT Dispense based on patient and insurance preference. Use to TEST BLOOD GLUCOSE THREE times daily as directed. DX E11.65 02/10/21   Cassandria Anger, MD  Cholecalciferol (VITAMIN D3) 5000 units CAPS Take 1 capsule (5,000 Units total) by mouth daily. 04/15/17   Cassandria Anger, MD  clotrimazole (LOTRIMIN) 1 % cream Apply 1 Application topically 2 (two) times daily. 11/08/22   Summerlin, Berneice Heinrich, PA-C  diclofenac Sodium (VOLTAREN ARTHRITIS PAIN) 1 % GEL Apply 2 g topically 3 (three) times daily. bilateral knees for osteoarthritis 07/25/22   Gerlene Fee, NP  gabapentin (NEURONTIN) 300 MG capsule Take 300 mg three times daily 07/25/22   Gerlene Fee, NP  guaiFENesin-dextromethorphan (ROBITUSSIN DM) 100-10 MG/5ML syrup Take 5-10 mLs by mouth every 4 (four) hours as needed for cough. 08/20/22   Hosie Poisson, MD  insulin glargine-yfgn (SEMGLEE) 100 UNIT/ML Pen Inject 20 Units into the skin at bedtime.    [provider]  insulin lispro (HUMALOG) 100  UNIT/ML injection Inject 3 Units into the skin 3 (three) times daily before meals.    [provider]  Insulin Pen Needle (BD AUTOSHIELD DUO) 30G X 5 MM MISC by Does not apply route as directed. 30 gauge X 3/16    [provider]  LASIX 20 MG tablet Take 20 mg by mouth every morning. 10/09/22   [provider]  magnesium oxide (MAG-OX) 400 (240 Mg) MG tablet Take 200 mg by mouth daily.    [provider]  metFORMIN (GLUCOPHAGE) 500 MG tablet Take 500 mg by mouth daily.    [provider]  metoprolol  succinate (TOPROL-XL) 50 MG 24 hr tablet Take 1 tablet (50 mg total) by mouth at bedtime. Take with or immediately following a meal. 07/25/22   Gerlene Fee, NP  silodosin (RAPAFLO) 8 MG CAPS capsule Take 1 capsule (8 mg total) by mouth daily with breakfast. 10/10/22   Summerlin, Berneice Heinrich, PA-C  simvastatin (ZOCOR) 20 MG tablet Take 1 tablet (20 mg total) by mouth every evening. 07/25/22   Gerlene Fee, NP  solifenacin (VESICARE) 5 MG tablet Take 1 tablet (5 mg total) by mouth daily. 09/26/22   McKenzie, Candee Furbish, MD  Trospium Chloride 60 MG CP24 Take 1 capsule (60 mg total) by mouth daily. 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  Tobacco Use   Smoking status: Never   Smokeless tobacco: Never  Vaping Use   Vaping Use: Never used  Substance and Sexual Activity   Alcohol use: No   Drug use: No   Sexual activity: Not Currently    Birth control/protection: None  Other Topics Concern   Not on file  Social History Narrative   Not on file   Social Determinants of Health   Financial Resource Strain: Not on file  Food Insecurity: No Food Insecurity (02/08/2019)   Hunger Vital Sign    Worried About Running Out of Food in the Last Year: Never true    [place] in the Last Year: Never true  Transportation Needs: No Transportation Needs (02/08/2019)   PRAPARE - Hydrologist (Medical): No    Lack of Transportation  (Non-Medical): No  Physical Activity: Inactive (02/08/2019)   Exercise Vital Sign    Days of Exercise per Week: 0 days    Minutes of Exercise per Session: 0 min  Stress: No Stress Concern Present (02/08/2019)   [place]    Feeling of Stress : Not at all  Social Connections: Unknown (02/08/2019)   Social Connection and Isolation Panel [NHANES]    Frequency of Communication with Friends and Family: More than three times a week    Frequency of Social Gatherings with Friends and Family: More than three times a week    Attends  Religious Services: More than 4 times per year    Active Member of Clubs or Organizations: Not on file    Attends Club or Organization Meetings: Not on file    Marital Status: Married  Intimate Partner Violence: Not At Risk (02/08/2019)   Humiliation, Afraid, Rape, and Kick questionnaire    Fear of Current or Ex-Partner: No    Emotionally Abused: No    Physically Abused: No    Sexually Abused: No    Family History:   The patient's family history includes CAD in her mother; Cancer in her brother; Heart attack in her brother; Hypertension in her mother; Stroke in her father.    ROS:  Please see the history of present illness.  All other ROS reviewed and negative.     Physical Exam/Data:   Vitals:   12/31/22 1650  SpO2: 100%   No intake or output data in the 24 hours ending 12/31/22 1704    10/10/2022    4:08 PM 09/06/2022    9:46 AM 09/03/2022    2:55 PM  Last 3 Weights  Weight (lbs) 175 lb 6.4 oz 173 lb 6.4 oz 173 lb 12.8 oz  Weight (kg) 79.561 kg 78.654 kg 78.835 kg     There is no height or weight on file to calculate BMI.  General:  Well nourished, well developed, pleasant elderly woman in no acute distress HEENT: normal Neck: no JVD Vascular: No carotid bruits; Distal pulses 2+ bilaterally   Cardiac:  normal S1, S2; RRR; no murmur  Lungs:  clear to auscultation bilaterally, no  wheezing, rhonchi or rales  Abd: soft, nontender, no hepatomegaly  Ext: no edema Musculoskeletal:  No deformities, BUE and BLE strength normal and equal Skin: warm and dry  Neuro:  CNs 2-12 intact, no focal abnormalities noted Psych:  Normal affect    EKG:  The ECG that was done was personally reviewed and demonstrates normal sinus rhythm with inferoposterior STEMI pattern  Relevant CV Studies: Cardiac catheterization pending  Laboratory Data:  High Sensitivity Troponin:  No results for input(s): "TROPONINIHS" in the last 720 hours.    ChemistryNo results for input(s): "NA", "K", "CL", "CO2", "GLUCOSE", "BUN", "CREATININE", "CALCIUM", "MG", "GFRNONAA", "GFRAA", "ANIONGAP" in the last 168 hours.  No results for input(s): "PROT", "ALBUMIN", "AST", "ALT", "ALKPHOS", "BILITOT" in the last 168 hours. Lipids No results for input(s): "CHOL", "TRIG", "HDL", "LABVLDL", "LDLCALC", "CHOLHDL" in the last 168 hours. HematologyNo results for input(s): "WBC", "RBC", "HGB", "HCT", "MCV", "MCH", "MCHC", "RDW", "PLT" in the last 168 hours. Thyroid No results for input(s): "TSH", "FREET4" in the last 168 hours. BNPNo results for input(s): "BNP", "PROBNP" in the last 168 hours.  DDimer No results for input(s): "DDIMER" in the last 168 hours.   Radiology/Studies:  No results found.   Assessment and Plan:   Inferior STEMI: Patient with ongoing ischemic chest discomfort.  Her symptoms have been prolonged and she appears to be late presenting.  However, she continues to exhibit chest pain and has ST elevation on her EKG with an inferoposterior injury pattern.  Emergency cardiac catheterization and possible PCI are indicated.  I had specific discussion with the patient related to her advanced age and comorbidities.  She states that she wishes to have everything done that can treat her acute medical illness.  She and her husband are hoping to return to independent living.  She is able to care for herself and  she appears to be cognitively intact and is sharp and appropriate  on my interview.  The patient will be treated with IV heparin and undergo cardiac catheterization as above.  Emergency implied consent is obtained.  I discussed the procedure with her.  I also called her daughter and discussed our plan of care over the telephone.  Further plans/disposition pending cardiac catheterization results.  She will be treated with appropriate GDMT using a high intensity statin drug, beta-blocker, and likely DAPT. Type 2 diabetes, insulin requiring: Will cover with sliding scale insulin while here.  Consider addition of [place] or Jardiance.  Will need to weigh risks and benefits as it appears the patient has been admitted with complicated urinary tract infection in the past. Hypertension: Patient has been treated with amlodipine and metoprolol.  Will need to consider addition of ACE/ARB in the setting of CAD and diabetes.  Will follow renal function and determine appropriate medications once her cardiac catheterization is completed.   Risk Assessment/Risk Scores:    TIMI Risk Score for ST  Elevation MI:   The patient's TIMI risk score is 5, which indicates a 12.4% risk of all cause mortality at 30 days.        Severity of Illness: The appropriate patient status for this patient is INPATIENT. Inpatient status is judged to be reasonable and necessary in order to provide the required intensity of service to ensure the patient's safety. The patient's presenting symptoms, physical exam findings, and initial radiographic and laboratory data in the context of their chronic comorbidities is felt to place them at high risk for further clinical deterioration. Furthermore, it is not anticipated that the patient will be medically stable for discharge from the hospital within 2 midnights of admission.   * I certify that at the point of admission it is my clinical judgment that the patient will require inpatient hospital  care spanning beyond 2 midnights from the point of admission due to high intensity of service, high risk for further deterioration and high frequency of surveillance required.*   For questions or updates, please contact [place] Please consult [email or web address] for contact info under     Signed, Sherren Mocha, MD  12/31/2022 5:04 PM

## 2022-12-31 NOTE — Progress Notes (Signed)
Consult for USGPIV for vasopressors. No veins noted suitable to L forearm. No tourniquet applied to R forearm with no suitable veins for vasopressor protocol noted. Dancer RN notified.

## 2023-01-01 ENCOUNTER — Inpatient Hospital Stay (HOSPITAL_COMMUNITY): Payer: Medicare Other

## 2023-01-01 ENCOUNTER — Encounter (HOSPITAL_COMMUNITY): Payer: Self-pay | Admitting: Internal Medicine

## 2023-01-01 ENCOUNTER — Other Ambulatory Visit (HOSPITAL_COMMUNITY): Payer: Self-pay

## 2023-01-01 DIAGNOSIS — I2489 Other forms of acute ischemic heart disease: Secondary | ICD-10-CM

## 2023-01-01 DIAGNOSIS — I2119 ST elevation (STEMI) myocardial infarction involving other coronary artery of inferior wall: Secondary | ICD-10-CM | POA: Diagnosis not present

## 2023-01-01 LAB — GLUCOSE, CAPILLARY
Glucose-Capillary: 186 mg/dL — ABNORMAL HIGH (ref 70–99)
Glucose-Capillary: 176 mg/dL — ABNORMAL HIGH (ref 70–99)
Glucose-Capillary: 103 mg/dL — ABNORMAL HIGH (ref 70–99)
Glucose-Capillary: 143 mg/dL — ABNORMAL HIGH (ref 70–99)

## 2023-01-01 LAB — ECHOCARDIOGRAM COMPLETE
Area-P 1/2: 2.48 cm2
Calc EF: 71.3 %
MV M vel: 2.09 m/s
MV Peak grad: 17.5 mmHg
S' Lateral: 2.4 cm
Single Plane A2C EF: 56.4 %
Single Plane A4C EF: 84.4 %

## 2023-01-01 LAB — LIPID PANEL
Cholesterol: 135 mg/dL (ref 0–200)
HDL: 50 mg/dL (ref >40)
LDL Cholesterol: 71 mg/dL (ref 0–99)
Total CHOL/HDL Ratio: 2.7 RATIO
Triglycerides: 70 mg/dL (ref <150)
VLDL: 14 mg/dL (ref 0–40)

## 2023-01-01 LAB — CBC
HCT: 34.8 % — ABNORMAL LOW (ref 36.0–46.0)
Hemoglobin: 11.6 g/dL — ABNORMAL LOW (ref 12.0–15.0)
MCH: 28.9 pg (ref 26.0–34.0)
MCHC: 33.3 g/dL (ref 30.0–36.0)
MCV: 86.8 fL (ref 80.0–100.0)
Platelets: 224 10*3/uL (ref 150–400)
RBC: 4.01 MIL/uL (ref 3.87–5.11)
RDW: 13.2 % (ref 11.5–15.5)
WBC: 12.5 10*3/uL — ABNORMAL HIGH (ref 4.0–10.5)
nRBC: 0 % (ref 0.0–0.2)

## 2023-01-01 LAB — BASIC METABOLIC PANEL
Anion gap: 11 (ref 5–15)
BUN: 18 mg/dL (ref 8–23)
CO2: 20 mmol/L — ABNORMAL LOW (ref 22–32)
Calcium: 9.2 mg/dL (ref 8.9–10.3)
Chloride: 103 mmol/L (ref 98–111)
Creatinine, Ser: 0.83 mg/dL (ref 0.44–1.00)
GFR, Estimated: >60 mL/min (ref >60)
Glucose, Bld: 253 mg/dL — ABNORMAL HIGH (ref 70–99)
Potassium: 3.6 mmol/L (ref 3.5–5.1)
Sodium: 134 mmol/L — ABNORMAL LOW (ref 135–145)

## 2023-01-01 MED ORDER — ZOLPIDEM TARTRATE 5 MG PO TABS
5.0000 mg | ORAL_TABLET | Freq: Every evening | ORAL | Status: DC | PRN
  Administered 2023-01-01 – 2023-01-03 (×3): 5 mg via ORAL
  Filled 2023-01-01 (×3): qty 1

## 2023-01-01 MED ORDER — METOPROLOL TARTRATE 25 MG PO TABS
25.0000 mg | ORAL_TABLET | Freq: Two times a day (BID) | ORAL | Status: DC
  Administered 2023-01-01 – 2023-01-04 (×6): 25 mg via ORAL
  Filled 2023-01-01 (×6): qty 1

## 2023-01-01 MED ORDER — PERFLUTREN LIPID MICROSPHERE
1.0000 mL | INTRAVENOUS | Status: AC | PRN
  Administered 2023-01-01: 2 mL via INTRAVENOUS

## 2023-01-01 NOTE — Progress Notes (Signed)
This nurse at bedside to remove pt's right femoral sheath. Pt noted to have no hematoma prior to sheath removal. VSS with continuous monitoring. Procedure explained to the pt, as well as the need for her to remain lying for 4 hours post-sheath removal and to keep her right leg straight. Pt does not report any questions or concerns. Lysbeth Galas, RN at bedside to assist. Pt noted to have palpable femoral and pedal pulses. Pedal pulse monitored with doppler throughout the procedure. Sheath removed at 2233 with immediate pressure applied with gauze. Continual site assessment throughout procedure. Pt noted to develop a hematoma to the upper aspect of the femoral sheath site. Pressure applied to hematoma site. Hematoma noted to subside after pressure applied. Firm pressure applied to sheath site for 20 minutes with end time of 2253. Clean gauze and a transparent dressing applied to site. Pt re-educated on the need to keep her leg straight and remain lying for 4 hours. No concerns or questions from the pt.

## 2023-01-01 NOTE — Progress Notes (Signed)
  Echocardiogram 2D Echocardiogram has been performed.  [name] 01/01/2023, 10:09 AM

## 2023-01-01 NOTE — Progress Notes (Signed)
2130:Patient complaining of recurring chest pain. VSS Nitro SL given x1 - See MAR   Patient quickly became symptomatic with hypotension SBP 70/50.  Dr. Limmie Patricia paged.  Order for 500 ML bolus and levophed gtt.    2132:Dr. Belal at bedside  See new orders

## 2023-01-01 NOTE — TOC Benefit Eligibility Note (Signed)
Patient Advocate Encounter  Insurance verification completed.    The patient is currently admitted and upon discharge could be taking Brilinta 90 mg.  The current 30 day co-pay is $47.00.   The patient is insured through [employer] Medicare Part D   Xayvier Vallez, CPHT Pharmacy Patient Advocate Specialist [facility] Patient Advocate Team Direct Number: [phone]  Fax: [phone]       

## 2023-01-01 NOTE — Evaluation (Signed)
Occupational Therapy Evaluation Patient Details Name: [name] MRN: [id number] DOB: 06-Dec-1929 Today's Date: 01/01/2023   History of Present Illness Patient is a 89 y/o female admitted with chest pain positive for STEMI and s/p cath/PCI of RCA 12/31/22.  PMH positive for HTN, DM, prior CVA.   Clinical Impression   [name] was evaluated s/p the above admission list, she is from ALF where they assist her with transfers, she is at [facility] level and is assisted with ADLs as needed. Pt was oriented x4 but noted to have confusion and STM deficits throughout session. Upon evaluation pt was limited by impaired cognition, baseline weakness, poor activity tolerance, and impaired sitting/standing balance. Overall she required max A to stand with stedy frame x3 and had posterior bias each trial. Due to deficits listed below, she requires up to mod A for UB ADLs and max/total A for LB ADLs. Pt is likely near her functional baseline. OT to follow acutely. Recommend d/c to ALF with increased staff assist and HHOT.   Addendum 1/10: Spoke with son and now they are more interested in STSNF at [facility]. Feel this is appropriate given level of assistance she is requiring for up OOB. RNCM, SW made aware.   Recommendations for follow up therapy are one component of a multi-disciplinary discharge planning process, led by the attending physician.  Recommendations may be updated based on patient status, additional functional criteria and insurance authorization.   Follow Up Recommendations  Skilled nursing-short term rehab (<3 hours/day)     Assistance Recommended at Discharge Frequent or constant Supervision/Assistance  Patient can return home with the following A lot of help with walking and/or transfers;A lot of help with bathing/dressing/bathroom;Assistance with cooking/housework;Direct supervision/assist for medications management;Direct supervision/assist for financial management;Assist for  transportation;Help with stairs or ramp for entrance    Functional Status Assessment  Patient has had a recent decline in their functional status and demonstrates the ability to make significant improvements in function in a reasonable and predictable amount of time.  Equipment Recommendations  None recommended by OT    Recommendations for Other Services       Precautions / Restrictions Precautions Precautions: Fall Precaution Comments: non ambulatory at baseline Restrictions Weight Bearing Restrictions: No      Mobility Bed Mobility               General bed mobility comments: pt OOB in recliner upon arrival    Transfers Overall transfer level: Needs assistance Equipment used: Ambulation equipment used Transfers: Sit to/from Stand Sit to Stand: Max assist           General transfer comment: 3x sit<>stand with steady. Posterior bias noted Transfer via Lift Equipment: Stedy    Balance Overall balance assessment: Needs assistance Sitting-balance support: Feet supported, Feet unsupported Sitting balance-Leahy Scale: Fair Sitting balance - Comments: limited unsupported sitting tolerance     Standing balance-Leahy Scale: Zero                             ADL either performed or assessed with clinical judgement   ADL Overall ADL's : Needs assistance/impaired Eating/Feeding: Independent;Sitting   Grooming: Set up;Sitting   Upper Body Bathing: Minimal assistance;Sitting   Lower Body Bathing: Maximal assistance;Sit to/from stand   Upper Body Dressing : Minimal assistance;Sitting   Lower Body Dressing: Maximal assistance;Sit to/from stand   Toilet Transfer: Maximal assistance Toilet Transfer Details (indicate cue type and reason): steady utilized. pt completes  lateral scoot transfers to [facility] with staff assist at baseline [place] and Hygiene: Total assistance Toileting - Clothing Manipulation Details (indicate cue type and  reason): incontinent at baseline, staff assists with hygiene     Functional mobility during ADLs: Maximal assistance General ADL Comments: pt likely near her functional baseline     Vision Baseline Vision/History: 0 No visual deficits Vision Assessment?: No apparent visual deficits     Perception Perception Perception Tested?: No   Praxis Praxis Praxis tested?: Not tested    Pertinent Vitals/Pain Pain Assessment Pain Assessment: No/denies pain Faces Pain Scale: No hurt Pain Intervention(s): Monitored during session     Hand Dominance Right   Extremity/Trunk Assessment Upper Extremity Assessment Upper Extremity Assessment: Generalized weakness   Lower Extremity Assessment Lower Extremity Assessment: Defer to PT evaluation RLE Deficits / Details: hip flexion 3/5, knee extension 4-/5, AROM generally WFL with obvious arthritic changes in knees and slightly limited extension RLE Sensation: decreased light touch (decreased on feet) LLE Deficits / Details: hip flexion 3/5, knee extension 3+/5, AROM generally WFL with obvious arthritic changes in knees and slightly limited extension LLE Sensation: decreased light touch   Cervical / Trunk Assessment Cervical / Trunk Assessment: Kyphotic   Communication Communication Communication: HOH   Cognition Arousal/Alertness: Awake/alert Behavior During Therapy: Anxious Overall Cognitive Status: No family/caregiver present to determine baseline cognitive functioning Area of Impairment: Memory, Safety/judgement, Awareness                     Memory: Decreased short-term memory   Safety/Judgement: Decreased awareness of safety Awareness: Emergent   General Comments: pt reports she is indep at ALF, upon further questioning she requires moderate assist from staff. Pt stating the "staff" doesnt assist her, "it's just people who are there." Minimal insigh to deficits, safety and problem solving     General Comments  VSS,  multiple skin tears and bruises noted    Exercises     Shoulder Instructions      Home Living Family/patient expects to be discharged to:: Assisted living                             Home Equipment: Rolling Walker (2 wheels);BSC/3in1;Shower seat;Grab bars - toilet;Grab bars - tub/shower;Wheelchair - manual   Additional Comments: poor historian, lives at [place]      Prior Functioning/Environment Prior Level of Function : Needs assist             Mobility Comments: wheelchiar level, staff assist for lateral scoots to chair ADLs Comments: ADLs at [facility] level, incontinent, wears brief and staff assist wtih hygiene. Rolls to cafeteria for meals        OT Problem List: Decreased strength;Decreased range of motion;Decreased activity tolerance;Impaired balance (sitting and/or standing);Decreased cognition;Decreased knowledge of use of DME or AE;Decreased safety awareness;Decreased knowledge of precautions;Impaired UE functional use      OT Treatment/Interventions: Self-care/ADL training;Therapeutic exercise;DME and/or AE instruction;Therapeutic activities;Patient/family education;Balance training    OT Goals(Current goals can be found in the care plan section) Acute Rehab OT Goals Patient Stated Goal: to go home OT Goal Formulation: With patient Time For Goal Achievement: 01/15/23 Potential to Achieve Goals: Good ADL Goals Pt Will Perform Grooming: with supervision;sitting Pt Will Perform Upper Body Dressing: with set-up;sitting Additional ADL Goal #1: pt will complete lateral scoot transfer with mod A as a precursor to OOB ADLs Additional ADL Goal #2: Pt will indep sequence through  ADL task to demonstrate safe ability to discharge  OT Frequency: Min 2X/week    Co-evaluation              AM-PAC OT "6 Clicks" Daily Activity     Outcome Measure Help from another person eating meals?: None Help from another person taking care of personal grooming?: A  Little Help from another person toileting, which includes using toliet, bedpan, or urinal?: A Lot Help from another person bathing (including washing, rinsing, drying)?: A Lot Help from another person to put on and taking off regular upper body clothing?: A Little Help from another person to put on and taking off regular lower body clothing?: A Lot 6 Click Score: 16   End of Session Equipment Utilized During Treatment: Gait belt;Other (comment) (stedy) Nurse Communication: Mobility status  Activity Tolerance: Patient tolerated treatment well Patient left: in chair;with call bell/phone within reach;with chair alarm set  OT Visit Diagnosis: Unsteadiness on feet (R26.81);Other abnormalities of gait and mobility (R26.89);Muscle weakness (generalized) (M62.81)                Time: 7096-2836 OT Time Calculation (min): 15 min Charges:  OT General Charges $OT Visit: 1 Visit OT Evaluation $OT Eval Moderate Complexity: 1 Mod    Arrington Yohe D Causey 01/01/2023, 1:25 PM

## 2023-01-01 NOTE — Evaluation (Signed)
Physical Therapy Evaluation Patient Details Name: [name] MRN: [id number] DOB: 1929/02/28 Today's Date: 01/01/2023  History of Present Illness  Patient is a 89 y/o female admitted with chest pain positive for STEMI and s/p cath/PCI of RCA 12/31/22.  PMH positive for HTN, DM, prior CVA.  Clinical Impression  Patient presents with decreased mobility due to generalized weakness, decreased safety and balance with historical fear of falling.  Patient eager to return home, but feel safer to stay at ALF and would benefit from [place] as needing increased help for transfers.  Spoke by phone with son, Arnette Norris to understand issues and he reports pt and her spouse plan to return home, but her spouse cannot handle her for transfers.  Patient reports plans to get hired help, but feel currently pt unable to return home.  Would recommend return to ALF where her spouse is for now with HHPT.  PT will continue to follow.    Addendum 1/10: Spoke with son and now they are more interested in [facility] at [facility].  Feel this is appropriate given level of assistance she is requiring for up OOB.  RNCM, SW made aware.      Recommendations for follow up therapy are one component of a multi-disciplinary discharge planning process, led by the attending physician.  Recommendations may be updated based on patient status, additional functional criteria and insurance authorization.  Follow Up Recommendations Skilled nursing-short term rehab (<3 hours/day)      Assistance Recommended at Discharge Frequent or constant Supervision/Assistance  Patient can return home with the following  A lot of help with walking and/or transfers;A little help with bathing/dressing/bathroom;Assist for transportation;Help with stairs or ramp for entrance;Assistance with cooking/housework    Equipment Recommendations None recommended by PT  Recommendations for Other Services       Functional Status Assessment Patient has had a recent  decline in their functional status and demonstrates the ability to make significant improvements in function in a reasonable and predictable amount of time.     Precautions / Restrictions Precautions Precautions: Fall Precaution Comments: non ambulatory Restrictions Weight Bearing Restrictions: No      Mobility  Bed Mobility Overal bed mobility: Needs Assistance Bed Mobility: Supine to Sit, Sit to Supine     Supine to sit: Supervision Sit to supine: Supervision   General bed mobility comments: using rails up to EOB with S for lines, when unable to reach rail after return to supine for hygiene with incontinent BM needed min a to lift trunk    Transfers Overall transfer level: Needs assistance   Transfers: Sit to/from Stand, Bed to chair/wheelchair/BSC Sit to Stand: Max assist           General transfer comment: attempted to stand without equipment and unable with +1 A; used Stedy and first attempt with assist from side unable to get up enough to lower flaps so stood in front of patient and assisted with pad under her hips and RN lowered pads to allow transfer to chair Transfer via Lift Equipment: Stedy  Ambulation/Gait                  Stairs            Wheelchair Mobility    Modified Rankin (Stroke Patients Only)       Balance Overall balance assessment: Needs assistance Sitting-balance support: Feet supported, Feet unsupported Sitting balance-Leahy Scale: Good Sitting balance - Comments: able to don socks with crossed leg technique seated EOB  Standing balance-Leahy Scale: Zero Standing balance comment: max support for standing                             Pertinent Vitals/Pain Pain Assessment Pain Assessment: No/denies pain    Home Living Family/patient expects to be discharged to:: Assisted living                 Home Equipment: Rolling Walker (2 wheels);BSC/3in1;Shower seat;Grab bars - toilet;Grab bars -  tub/shower;Wheelchair - manual      Prior Function Prior Level of Function : Needs assist             Mobility Comments: gets around in wheelchair, needs assist of ALF staff for up to wheelchair ADLs Comments: incontinent of bowel and bladder, assisted for hygiene     Hand Dominance   Dominant Hand: Right    Extremity/Trunk Assessment   Upper Extremity Assessment Upper Extremity Assessment: Generalized weakness    Lower Extremity Assessment Lower Extremity Assessment: RLE deficits/detail;LLE deficits/detail RLE Deficits / Details: hip flexion 3/5, knee extension 4-/5, AROM generally WFL with obvious arthritic changes in knees and slightly limited extension RLE Sensation: decreased light touch (decreased on feet) LLE Deficits / Details: hip flexion 3/5, knee extension 3+/5, AROM generally WFL with obvious arthritic changes in knees and slightly limited extension LLE Sensation: decreased light touch    Cervical / Trunk Assessment Cervical / Trunk Assessment: Kyphotic  Communication   Communication: HOH  Cognition Arousal/Alertness: Awake/alert Behavior During Therapy: Anxious Overall Cognitive Status: No family/caregiver present to determine baseline cognitive functioning Area of Impairment: Memory, Safety/judgement, Awareness                     Memory: Decreased short-term memory   Safety/Judgement: Decreased awareness of safety Awareness: Emergent   General Comments: oriented x 4, trying to use the phone but not able to use accurately without assist, thought room phone belonged to the nurse, confused as to when her family was here talking with son on the phone        General Comments General comments (skin integrity, edema, etc.): Patient reports scratches and bruises from treatments since she has been here.    Exercises     Assessment/Plan    PT Assessment Patient needs continued PT services  PT Problem List Decreased strength;Decreased  mobility;Decreased safety awareness       PT Treatment Interventions DME instruction;Functional mobility training;Patient/family education;Therapeutic activities;Wheelchair mobility training    PT Goals (Current goals can be found in the Care Plan section)  Acute Rehab PT Goals Patient Stated Goal: to go home, per son back to ALF PT Goal Formulation: With patient/family Time For Goal Achievement: 01/15/23 Potential to Achieve Goals: Fair    Frequency Min 3X/week     Co-evaluation               AM-PAC PT "6 Clicks" Mobility  Outcome Measure Help needed turning from your back to your side while in a flat bed without using bedrails?: None Help needed moving from lying on your back to sitting on the side of a flat bed without using bedrails?: None Help needed moving to and from a bed to a chair (including a wheelchair)?: Total Help needed standing up from a chair using your arms (e.g., wheelchair or bedside chair)?: Total Help needed to walk in hospital room?: Total Help needed climbing 3-5 steps with a railing? : Total 6 Click Score:  12    End of Session Equipment Utilized During Treatment: Gait belt Activity Tolerance: Patient tolerated treatment well Patient left: in chair;with call bell/phone within reach;with chair alarm set Nurse Communication: Mobility status;Need for lift equipment PT Visit Diagnosis: Other abnormalities of gait and mobility (R26.89);Muscle weakness (generalized) (M62.81)    Time: 3943-2003 PT Time Calculation (min) (ACUTE ONLY): 36 min   Charges:   PT Evaluation $PT Eval Moderate Complexity: 1 Mod PT Treatments $Therapeutic Activity: 8-22 mins        Magda Kiel, PT Acute Rehabilitation Services Office:[phone] 01/01/2023   Reginia Naas 01/01/2023, 11:10 AM

## 2023-01-01 NOTE — Progress Notes (Signed)
Rounding Note    Patient Name: [name] Date of Encounter: 01/01/2023  [place] Cardiologist: None   Subjective   Feeling well this morning.  No chest pain or shortness of breath overnight.  No complaints this morning.  Inpatient Medications    Scheduled Meds:  aspirin  81 mg Oral Daily   Chlorhexidine Gluconate Cloth  6 each Topical Daily   Chlorhexidine Gluconate Cloth  6 each Topical Q0600   heparin  5,000 Units Subcutaneous Q8H   insulin aspart  0-15 Units Subcutaneous TID WC   insulin glargine-yfgn  20 Units Subcutaneous QHS   metoprolol tartrate  12.5 mg Oral BID   mupirocin ointment  1 Application Nasal BID   rosuvastatin  20 mg Oral Daily   sodium chloride flush  3 mL Intravenous Q12H   ticagrelor  90 mg Oral BID   Continuous Infusions:  sodium chloride     norepinephrine     norepinephrine (LEVOPHED) Adult infusion Stopped (01/01/23 0213)   PRN Meds: sodium chloride, acetaminophen, nitroGLYCERIN, norepinephrine, ondansetron (ZOFRAN) IV, sodium chloride flush   Vital Signs    Vitals:   01/01/23 0630 01/01/23 0700 01/01/23 0730 01/01/23 0800  BP:   (!) 151/56 (!) 151/54  Pulse: 79 79 73 71  Resp: '19 20 16 13  '$ Temp:      TempSrc:      SpO2: 97% 96% 95% 96%    Intake/Output Summary (Last 24 hours) at 01/01/2023 0837 Last data filed at 01/01/2023 0300 Gross per 24 hour  Intake 673.98 ml  Output 200 ml  Net 473.98 ml      10/10/2022    4:08 PM 09/06/2022    9:46 AM 09/03/2022    2:55 PM  Last 3 Weights  Weight (lbs) 175 lb 6.4 oz 173 lb 6.4 oz 173 lb 12.8 oz  Weight (kg) 79.561 kg 78.654 kg 78.835 kg      Telemetry    Normal sinus rhythm, occasional PVC, no sustained arrhythmia - Personally Reviewed  ECG    NSR with RBBB, HR 77 bpm - Personally Reviewed  Physical Exam  Alert, oriented, elderly woman in NAD GEN: No acute distress.   Neck: No JVD Cardiac: RRR, 2/6 systolic ejection murmur best heard at the right upper  sternal border, no diastolic murmur Respiratory: Clear to auscultation bilaterally. GI: Soft, nontender, non-distended  MS: No edema; No deformity.  Right radial site and right groin site clear.  Mild ecchymoses at the right groin site but no firm hematoma. Neuro:  Nonfocal  Psych: Normal affect   Labs    High Sensitivity Troponin:   Recent Labs  Lab 12/31/22 2020  TROPONINIHS >24,000*     Chemistry Recent Labs  Lab 12/31/22 1657 12/31/22 2020 01/01/23 0233  NA 137 134* 134*  K 4.1 3.6 3.6  CL 102 102 103  CO2  --  22 20*  GLUCOSE 228* 222* 253*  BUN '17 16 18  '$ CREATININE 0.60 0.74 0.83  CALCIUM  --  9.1 9.2  PROT  --  6.0*  --   ALBUMIN  --  3.0*  --   AST  --  89*  --   ALT  --  24  --   ALKPHOS  --  76  --   BILITOT  --  0.8  --   GFRNONAA  --  >60 >60  ANIONGAP  --  10 11    Lipids  Recent Labs  Lab 01/01/23 0233  CHOL 135  TRIG 70  HDL 50  LDLCALC 71  CHOLHDL 2.7    Hematology Recent Labs  Lab 12/31/22 1657 12/31/22 2020 01/01/23 0233  WBC  --  11.0* 12.5*  RBC  --  4.21 4.01  HGB 13.6 12.2 11.6*  HCT 40.0 36.8 34.8*  MCV  --  87.4 86.8  MCH  --  29.0 28.9  MCHC  --  33.2 33.3  RDW  --  13.2 13.2  PLT  --  233 224   Thyroid  Recent Labs  Lab 12/31/22 2020  TSH 2.673    BNP Recent Labs  Lab 12/31/22 2020  BNP 510.0*    DDimer No results for input(s): "DDIMER" in the last 168 hours.   Radiology    Portable chest x-ray 1 view  Result Date: 12/31/2022 CLINICAL DATA:  STEMI EXAM: PORTABLE CHEST 1 VIEW COMPARISON:  08/19/2022 FINDINGS: No acute airspace disease or effusion. Stable cardiomediastinal silhouette. No pneumothorax. Coarse appearing calcification overlying the right breast soft tissues. IMPRESSION: No active disease. Electronically Signed   By: Donavan Foil M.D.   On: 12/31/2022 20:13   CARDIAC CATHETERIZATION  Result Date: 12/31/2022 Conclusions: Significant multivessel coronary artery disease, as detailed below.  Culprit  for the inferior STEMI are sequential 90% proximal/mid and 100% distal RCA lesions.  There is also a 70-80% distal LCx stenosis as well as 50-60% lesions involving OM1 and both diagonal branches. Challenging PCI to RCA with inability to pass anything other than a [place] 1.5 mm balloon to the distal RCA.  This allowed flow to be area established with improvement in distal RCA disease from 100% to 90% with TIMI-3 flow. Successful PCI to proximal/mid RCA stenosis using Synergy 2.5 x 20 mm drug-eluting stent with reduction in stenosis from 90% to less than 20% and reestablishment of TIMI-3 flow. Recommendations: Dual antiplatelet therapy with aspirin and ticagrelor for at least 12 months. Aggressive secondary prevention of coronary artery disease. Obtain echocardiogram for evaluation of left ventricular systolic function. [name] Bush, MD [facility]   Cardiac Studies   Cardiac cath films personally reviewed.   Patient Profile     89 y.o. female with hx of DM, HTN, and prior stroke, presents with acute inferior STEMI with late presentation (>12 hours symptoms), treated with primary PCI of the RCA 12/31/22  Assessment & Plan    STEMI involving the RCA - late presentation. Successful PCI yesterday. Initial HS-trop > 24,000. Hemodynamics/heart rhythm stable. Plan: continue asa/ticagrelor without interruption x 1 year. Increase metoprolol to 25 mg BID today. Continue Rosuvastatin 20 mg. Tx tele bed. Check 2D echo. Cath films reviewed - agree with med Rx for residual CAD.  Type 2 DM, uncontrolled with hyperglycemia: start AC/HS accuchecks and SSI coverage this morning.  HTN: increase beta-blocker today.  Deconditioning: PT/OT assessment today.  Social worker consult.  Patient currently is a resident at [place] [facility] assisted living with her husband.  She and her husband would like to move back into their home in the next few weeks.  Their grown children have concerns about this as they state the patient is  unable to even transfer on her own.  I think we need to get some objective assessment on her functional capacity and determine the best/safest disposition for her.  Dispo: tx tele today. Otherwise as above. Estimated LOS another 24-48 hours depending on clinical progress.  For questions or updates, please contact [facility] [place] Please consult [email or web address] for contact info under  Signed, Sherren Mocha, MD  01/01/2023, 8:37 AM

## 2023-01-02 DIAGNOSIS — I2119 ST elevation (STEMI) myocardial infarction involving other coronary artery of inferior wall: Secondary | ICD-10-CM | POA: Diagnosis not present

## 2023-01-02 LAB — GLUCOSE, CAPILLARY
Glucose-Capillary: 97 mg/dL (ref 70–99)
Glucose-Capillary: 94 mg/dL (ref 70–99)
Glucose-Capillary: 84 mg/dL (ref 70–99)
Glucose-Capillary: 158 mg/dL — ABNORMAL HIGH (ref 70–99)

## 2023-01-02 LAB — LIPOPROTEIN A (LPA): Lipoprotein (a): <8.4 nmol/L (ref <75.0)

## 2023-01-02 MED ORDER — SERTRALINE HCL 50 MG PO TABS
75.0000 mg | ORAL_TABLET | Freq: Every day | ORAL | Status: DC
  Administered 2023-01-02 – 2023-01-04 (×3): 75 mg via ORAL
  Filled 2023-01-02 (×3): qty 2

## 2023-01-02 MED ORDER — METFORMIN HCL 500 MG PO TABS
500.0000 mg | ORAL_TABLET | Freq: Every day | ORAL | Status: DC
  Administered 2023-01-03 – 2023-01-04 (×2): 500 mg via ORAL
  Filled 2023-01-02 (×2): qty 1

## 2023-01-02 NOTE — NC FL2 (Signed)
[place] LEVEL OF CARE FORM     IDENTIFICATION  Patient Name: [name] Birthdate: 09-04-1929 Sex: female Admission Date (Current Location): 12/31/2022  [facility] and [place] Number:  Herbalist and Address:  The . [facility], [place] [address], [place], [place] [address]      Provider Number: [id number]  Attending Physician Name and Address:  Sherren Mocha, MD  Relative Name and Phone Number:  Wille Glaser (Spouse) [phone]    Current Level of Care: Hospital Recommended Level of Care: [place] Prior Approval Number:    Date Approved/Denied:   PASRR Number: [id number]  Discharge Plan: SNF    Current Diagnoses: Patient Active Problem List   Diagnosis Date Noted   Acute ST elevation myocardial infarction (STEMI) of inferior wall ([place]) 12/31/2022   SARS-CoV-2 positive 08/24/2022   SARS-CoV-2 antibody positive 08/23/2022   Emphysema of lung ([place]) 08/21/2022   Vascular dementia without behavioral disturbance ([place]) 08/21/2022   Decubitus ulcer of buttock, stage 2 ([place]) 08/13/2022   Hypomagnesemia 08/03/2022   Hypoglycemia associated with diabetes ([place]) 08/02/2022   Hypokalemia 08/02/2022   Poorly controlled type 2 diabetes mellitus with neuropathy ([place]) 07/19/2022   Diabetic neuropathy associated with type 2 diabetes mellitus ([place]) 07/16/2022   Hypertension associated with type 2 diabetes mellitus ([place]) 07/16/2022   Hyperlipidemia associated with type 2 diabetes mellitus ([place]) 07/16/2022   Chronic insomnia 07/16/2022   Bilateral lower extremity edema 07/16/2022   Chronic constipation 07/16/2022   Aortic atherosclerosis ([place]) 07/16/2022   Major neurocognitive disorder ([place]) 07/16/2022   DJD (degenerative joint disease) of knee 07/14/2022   UTI (urinary tract infection) 07/09/2022   Late effects of cerebrovascular disease 09/25/2021   Overactive bladder 62/83/1517   Acute metabolic encephalopathy 61/60/7371    Cellulitis of buttock 07/14/2019   Hyperlipidemia 02/20/2016   Essential hypertension 10/10/2015   Controlled type 2 diabetes with neuropathy ([place]) 10/01/2008    Orientation RESPIRATION BLADDER Height & Weight     Self, Time, Situation, Place  Normal Incontinent, External catheter (External Urinary Catheter) Weight:   Height:     BEHAVIORAL SYMPTOMS/MOOD NEUROLOGICAL BOWEL NUTRITION STATUS      Incontinent Diet (Please see discharge summary)  AMBULATORY STATUS COMMUNICATION OF NEEDS Skin   Extensive Assist Verbally Other (Comment) (Appropriate for ethnicity, dry,flaky,ecchymosis,bilateral,Erythema,abdomen,groin,leg,Bilateral,Wound/Incision LDAs,PI sacrum,Bilateral,medial stage 1)                       Personal Care Assistance Level of Assistance  Bathing, Feeding, Dressing Bathing Assistance: Limited assistance Feeding assistance: Independent Dressing Assistance: Limited assistance     Functional Limitations Info  Sight, Hearing, Speech Sight Info:  (WDL) Hearing Info: Impaired Speech Info: Adequate    SPECIAL CARE FACTORS FREQUENCY  PT (By licensed PT), OT (By licensed OT)     PT Frequency: 5x min weekly OT Frequency: 5x min weekly            Contractures Contractures Info: Not present    Additional Factors Info  Code Status, Allergies, Insulin Sliding Scale, Psychotropic Code Status Info: FULL Allergies Info: Codeine,Glipizide,Sulfa Antibiotics Psychotropic Info: sertraline (ZOLOFT) tablet 75 mg daily Insulin Sliding Scale Info: insulin aspart (novoLOG) injection 0-15 Units 3 times daily with meals,insulin glargine-yfgn (SEMGLEE) injection 20 Units daily at bedtime       Current Medications (01/02/2023):  This is the current hospital active medication list Current Facility-Administered Medications  Medication Dose Route Frequency Provider Last Rate Last Admin   0.9 %  sodium chloride infusion  250 mL Intravenous PRN Sherren Mocha, MD        acetaminophen (TYLENOL) tablet 650 mg  650 mg Oral Q4H PRN Sherren Mocha, MD   650 mg at 01/02/23 8295   aspirin chewable tablet 81 mg  81 mg Oral Daily Sherren Mocha, MD   81 mg at 01/02/23 6213   Chlorhexidine Gluconate Cloth 2 % PADS 6 each  6 each Topical Daily Sherren Mocha, MD   6 each at 01/01/23 1037   Chlorhexidine Gluconate Cloth 2 % PADS 6 each  6 each Topical Q0600 Sherren Mocha, MD   6 each at 01/02/23 0629   heparin injection 5,000 Units  5,000 Units Subcutaneous Lovenia Kim, MD   5,000 Units at 01/02/23 0636   insulin aspart (novoLOG) injection 0-15 Units  0-15 Units Subcutaneous TID [facility] Sherren Mocha, MD   3 Units at 01/01/23 1155   insulin glargine-yfgn (SEMGLEE) injection 20 Units  20 Units Subcutaneous QHS Sherren Mocha, MD   20 Units at 01/01/23 2141   metFORMIN (GLUCOPHAGE) tablet 500 mg  500 mg Oral Q breakfast Johny Blamer, DO       metoprolol tartrate (LOPRESSOR) tablet 25 mg  25 mg Oral BID Sherren Mocha, MD   25 mg at 01/02/23 0847   mupirocin ointment (BACTROBAN) 2 % 1 Application  1 Application Nasal BID Sherren Mocha, MD   1 Application at 08/65/78 1034   nitroGLYCERIN (NITROSTAT) SL tablet 0.4 mg  0.4 mg Sublingual Q5 Min x 3 PRN Sherren Mocha, MD   0.4 mg at 12/31/22 2113   ondansetron (ZOFRAN) injection 4 mg  4 mg Intravenous Q6H PRN Sherren Mocha, MD       rosuvastatin (CRESTOR) tablet 20 mg  20 mg Oral Daily Sherren Mocha, MD   20 mg at 01/02/23 0847   sertraline (ZOLOFT) tablet 75 mg  75 mg Oral Daily Sherren Mocha, MD   75 mg at 01/02/23 1034   sodium chloride flush (NS) 0.9 % injection 3 mL  3 mL Intravenous Janalee Dane, MD   3 mL at 01/02/23 1038   sodium chloride flush (NS) 0.9 % injection 3 mL  3 mL Intravenous PRN Sherren Mocha, MD       ticagrelor [facility]) tablet 90 mg  90 mg Oral BID Sherren Mocha, MD   90 mg at 01/02/23 0846   zolpidem (AMBIEN) tablet 5 mg  5 mg Oral QHS PRN Sherren Mocha, MD   5 mg at  01/01/23 2140     Discharge Medications: Please see discharge summary for a list of discharge medications.  Relevant Imaging Results:  Relevant Lab Results:   Additional Information [id number]  Milas Gain, LCSWA

## 2023-01-02 NOTE — Discharge Instructions (Signed)

## 2023-01-02 NOTE — Discharge Summary (Signed)
Discharge Summary    Patient ID: [name] MRN: [id number]; DOB: 1929-01-30  Admit date: 12/31/2022 Discharge date: 01/04/2023  PCP:  Sharilyn Sites, MD   [place] Providers Cardiologist:  None      Discharge Diagnoses    Principal Problem:   Acute ST elevation myocardial infarction (STEMI) of inferior wall [facility]) Active Problems:   Essential hypertension   Hyperlipidemia   Diabetic neuropathy associated with type 2 diabetes mellitus ([place])   Hypokalemia   Hypomagnesemia   CAD (coronary artery disease)   Pressure injury of skin   Physical debility   Leukocytosis    Diagnostic Studies/Procedures    Cath: 12/31/2022  Conclusions: Significant multivessel coronary artery disease, as detailed below.  Culprit for the inferior STEMI are sequential 90% proximal/mid and 100% distal RCA lesions.  There is also a 70-80% distal LCx stenosis as well as 50-60% lesions involving OM1 and both diagonal branches. Challenging PCI to RCA with inability to pass anything other than a Tekeru 1.5 mm balloon to the distal RCA.  This allowed flow to be area established with improvement in distal RCA disease from 100% to 90% with TIMI-3 flow. Successful PCI to proximal/mid RCA stenosis using Synergy 2.5 x 20 mm drug-eluting stent with reduction in stenosis from 90% to less than 20% and reestablishment of TIMI-3 flow.   Recommendations: Dual antiplatelet therapy with aspirin and ticagrelor for at least 12 months. Aggressive secondary prevention of coronary artery disease. Obtain echocardiogram for evaluation of left ventricular systolic function.   [name], MD [facility]  Echo: 01/01/2023   IMPRESSIONS    1. Left ventricular ejection fraction, by estimation, is 60 to 65%. The  left ventricle has normal function. The left ventricle has no regional  wall motion abnormalities. There is mild concentric left ventricular  hypertrophy. Left ventricular diastolic   parameters are consistent with Grade I diastolic dysfunction (impaired  relaxation).   2. Right ventricular systolic function is normal. The right ventricular  size is normal. Tricuspid regurgitation signal is inadequate for assessing  PA pressure.   3. The mitral valve is degenerative. Trivial mitral valve regurgitation.  No evidence of mitral stenosis.   4. The aortic valve is tricuspid. There is mild calcification of the  aortic valve. There is mild thickening of the aortic valve. Aortic valve  regurgitation is not visualized. Aortic valve sclerosis is present, with  no evidence of aortic valve stenosis.   5. The inferior vena cava is normal in size with greater than 50%  respiratory variability, suggesting right atrial pressure of 3 mmHg.    _____________   History of Present Illness     [name] is a 89 y.o. female with  DM with neuropathy, HTN, prior stroke, RBBB, dementia per chart who lives at ALF presented with chest pain and late-presenting inferior MI. The night prior to admission she developed epigastric and substernal discomfort described as burning/indigestion-like sensation.  Symptoms persisted throughout the night and intensified day of admission without relief with Tums. She notified her staff at [facility] and EMS was called. Upon their evaluation, the patient had an inferior STEMI pattern on EKG and they activated a code STEMI.  She is transported directly to [facility] for further evaluation and treatment of STEMI.     Hospital Course     1. Late presenting inferior STEMI, CAD - cath 12/31/22 with significant MVCAD: Culprit for the inferior STEMI are sequential 90% proximal/mid and 100% distal RCA lesions. There  is also a 70-80% distal LCx stenosis as well as 50-60% lesions involving OM1 and both diagonal branches. Challenging PCI to RCA with inability to pass anything other than a Tekeru 1.5 mm balloon to the distal RCA. This allowed flow to be area  established with improvement in distal RCA disease from 100% to 90% with TIMI-3 flow. Successful PCI to proximal/mid RCA stenosis using Synergy 2.5 x 20 mm drug-eluting stent with reduction in stenosis from 90% to less than 20% and reestablishment of TIMI-3 flow.  - EF 60-65%, G1DD, mild LVH, trivial MR - now on ASA, Brilinta, metoprolol, and adjusted statin   2. Diabetes mellitus - Hemoglobin A1c 7.2 - metformin restarted since >48 hours from cath - resume home insulin regimen at discharge - would recommend this be managed at her nursing facility with regular CBG checks per protocol    3. Essential HTN - SBP variable from 111/95 to 151/59 over the last 24 hours  - started on losartan just yesterday, follow with this increase - recommend BMET in 1 week, can be obtained at f/u visit   4. Hyperlipidemia - LDL 71, HDL 50 - simvastatin changed to rosuvastatin - LFTs on admission with AST 89, normal ALT - repeat improved - if the patient is tolerating statin at time of follow-up appointment, would consider rechecking liver function/lipid panel in 6-8 weeks.   5. Hypokalemia, also hx of hypomagnesemia - repleted here in the hospital prior to discharge - recommend f/u labs as OP in follow-up - started on losartan 01/03/22, PTA magnesium resumed at discharge - calcium 8.6 but corrects to normal for albumin   6. Normocytic anemia - appears similar to prior values, recommend follow up with PCP as OP   7. Debilitation - seen by PT/OT, pending DC to SNF   8. Psych - on sertraline as OP, pharmD verified she is on '75mg'$  daily total in form of 25 + '50mg'$  daily, continued here   9. Leukocytosis - resolved, likely stress demargination   10. Pressure injury of skin, stage 1 on buttocks - would suggest routine wound care at SNF and f/u with PCP  11. MRSA screen positive - received protocol chlorhexadine daily + mupirocin BID with recommendation for 5 days - received both this AM - last  chlorhexadine should tomorrow AM, needs mupirocin tonight and tomorrow AM  Dr. Burt Knack has seen and examined the patient today and feels she is stable for discharge to SNF. The medicines removed from med list below are either medicines that she reported not taking prior to admission or that were discontinued by the provider team here in the hospital.  Did the patient have an acute coronary syndrome (MI, NSTEMI, STEMI, etc) this admission?:  Yes                               AHA/ACC Clinical Performance & Quality Measures: Aspirin prescribed? - Yes ADP Receptor Inhibitor (Plavix/Clopidogrel, Brilinta/Ticagrelor or Effient/Prasugrel) prescribed (includes medically managed patients)? - Yes Beta Blocker prescribed? - Yes High Intensity Statin (Lipitor 40-'80mg'$  or Crestor 20-'40mg'$ ) prescribed? - Yes EF assessed during THIS hospitalization? - Yes For EF <40%, was ACEI/ARB prescribed? - Not Applicable (EF >/= 94%) For EF <40%, Aldosterone Antagonist (Spironolactone or Eplerenone) prescribed? - Not Applicable (EF >/= 85%) Cardiac Rehab Phase II ordered (including medically managed patients)? - Per CR exercise physiologist, "She is currently unable to stand independently and therefore is not appropriate for CRPII.  However will place referral for AP CRPII to cover protocol. "      TOC f/u scheduled in 1 week, TOC (ph call not initiated as she does not meet criteria with SNF).  _____________  Discharge Vitals Blood pressure (!) 151/59, pulse 60, temperature 98.1 F (36.7 C), temperature source Oral, resp. rate 20, weight 77.6 kg, SpO2 98 %.  Filed Weights   01/03/23 0333  Weight: 77.6 kg    Labs & Radiologic Studies    CBC Recent Labs    01/04/23 0251  WBC 9.8  HGB 11.2*  HCT 34.3*  MCV 88.6  PLT 017   Basic Metabolic Panel Recent Labs    01/04/23 0251  NA 137  K 3.3*  CL 105  CO2 23  GLUCOSE 92  BUN 19  CREATININE 0.74  CALCIUM 8.6*  MG 1.6*   Liver Function Tests Recent  Labs    01/04/23 0251  AST 27  ALT 19  ALKPHOS 65  BILITOT 0.7  PROT 5.7*  ALBUMIN 2.7*   No results for input(s): "LIPASE", "AMYLASE" in the last 72 hours. High Sensitivity Troponin:   Recent Labs  Lab 12/31/22 2020  TROPONINIHS >24,000*    _____________  ECHOCARDIOGRAM COMPLETE  Result Date: 01/01/2023    ECHOCARDIOGRAM REPORT   Patient Name:   [name] Date of Exam: 01/01/2023 Medical Rec #:  [id number]           Height:       69.0 in Accession #:    [id number]          Weight:       175.4 lb Date of Birth:  [birth year]          BSA:          1.954 m Patient Age:    48 years            BP:           151/54 mmHg Patient Gender: F                   HR:           78 bpm. Exam Location:  Inpatient Procedure: 2D Echo, Color Doppler, Cardiac Doppler and Intracardiac            Opacification Agent Indications:    Acute ischemic heart disease  History:        Patient has prior history of Echocardiogram examinations, most                 recent 03/01/2020. Acute MI, Stroke, Signs/Symptoms:Chest Pain;                 Risk Factors:Diabetes and Hypertension.  Sonographer:    Eartha Inch Referring Phys: [id number] MICHAEL COOPER  Sonographer Comments: Technically difficult study due to poor echo windows. Image acquisition challenging due to patient body habitus and Image acquisition challenging due to respiratory motion. IMPRESSIONS  1. Left ventricular ejection fraction, by estimation, is 60 to 65%. The left ventricle has normal function. The left ventricle has no regional wall motion abnormalities. There is mild concentric left ventricular hypertrophy. Left ventricular diastolic parameters are consistent with Grade I diastolic dysfunction (impaired relaxation).  2. Right ventricular systolic function is normal. The right ventricular size is normal. Tricuspid regurgitation signal is inadequate for assessing PA pressure.  3. The mitral valve is degenerative. Trivial mitral valve regurgitation. No  evidence of mitral stenosis.  4. The aortic valve is tricuspid.  There is mild calcification of the aortic valve. There is mild thickening of the aortic valve. Aortic valve regurgitation is not visualized. Aortic valve sclerosis is present, with no evidence of aortic valve stenosis.  5. The inferior vena cava is normal in size with greater than 50% respiratory variability, suggesting right atrial pressure of 3 mmHg. FINDINGS  Left Ventricle: Left ventricular ejection fraction, by estimation, is 60 to 65%. The left ventricle has normal function. The left ventricle has no regional wall motion abnormalities. Definity contrast agent was given IV to delineate the left ventricular  endocardial borders. The left ventricular internal cavity size was normal in size. There is mild concentric left ventricular hypertrophy. Left ventricular diastolic parameters are consistent with Grade I diastolic dysfunction (impaired relaxation). Right Ventricle: The right ventricular size is normal. No increase in right ventricular wall thickness. Right ventricular systolic function is normal. Tricuspid regurgitation signal is inadequate for assessing PA pressure. Left Atrium: Left atrial size was normal in size. Right Atrium: Right atrial size was normal in size. Pericardium: There is no evidence of pericardial effusion. Mitral Valve: The mitral valve is degenerative in appearance. Mild to moderate mitral annular calcification. Trivial mitral valve regurgitation. No evidence of mitral valve stenosis. Tricuspid Valve: The tricuspid valve is grossly normal. Tricuspid valve regurgitation is trivial. No evidence of tricuspid stenosis. Aortic Valve: The aortic valve is tricuspid. There is mild calcification of the aortic valve. There is mild thickening of the aortic valve. Aortic valve regurgitation is not visualized. Aortic valve sclerosis is present, with no evidence of aortic valve stenosis. Pulmonic Valve: The pulmonic valve was grossly  normal. Pulmonic valve regurgitation is not visualized. No evidence of pulmonic stenosis. Aorta: The aortic root and ascending aorta are structurally normal, with no evidence of dilitation. Venous: The inferior vena cava is normal in size with greater than 50% respiratory variability, suggesting right atrial pressure of 3 mmHg. IAS/Shunts: The atrial septum is grossly normal.  LEFT VENTRICLE PLAX 2D LVIDd:         3.80 cm     Diastology LVIDs:         2.40 cm     LV e' medial:    3.75 cm/s LV PW:         1.20 cm     LV E/e' medial:  29.9 LV IVS:        1.30 cm     LV e' lateral:   7.81 cm/s LVOT diam:     2.10 cm     LV E/e' lateral: 14.3 LV SV:         91 LV SV Index:   47 LVOT Area:     3.46 cm  LV Volumes (MOD) LV vol d, MOD A2C: 62.4 ml LV vol d, MOD A4C: 85.0 ml LV vol s, MOD A2C: 27.2 ml LV vol s, MOD A4C: 13.3 ml LV SV MOD A2C:     35.2 ml LV SV MOD A4C:     85.0 ml LV SV MOD BP:      52.2 ml RIGHT VENTRICLE            IVC RV S prime:     9.99 cm/s  IVC diam: 1.60 cm TAPSE (M-mode): 1.8 cm LEFT ATRIUM             Index        RIGHT ATRIUM           Index LA diam:  3.40 cm 1.74 cm/m   RA Area:     13.40 cm LA Vol (A2C):   36.8 ml 18.83 ml/m  RA Volume:   32.00 ml  16.38 ml/m LA Vol (A4C):   27.2 ml 13.92 ml/m LA Biplane Vol: 32.7 ml 16.74 ml/m  AORTIC VALVE LVOT Vmax:   103.00 cm/s LVOT Vmean:  77.000 cm/s LVOT VTI:    0.264 m  AORTA Ao Root diam: 2.90 cm Ao Asc diam:  3.30 cm MITRAL VALVE MV Area (PHT): 2.48 cm     SHUNTS MV Decel Time: 306 msec     Systemic VTI:  0.26 m MR Peak grad: 17.5 mmHg     Systemic Diam: 2.10 cm MR Vmax:      209.00 cm/s MV E velocity: 112.00 cm/s MV A velocity: 114.00 cm/s MV E/A ratio:  0.98 Eleonore Chiquito MD Electronically signed by Eleonore Chiquito MD Signature Date/Time: 01/01/2023/1:15:54 PM    Final    Portable chest x-ray 1 view  Result Date: 12/31/2022 CLINICAL DATA:  STEMI EXAM: PORTABLE CHEST 1 VIEW COMPARISON:  08/19/2022 FINDINGS: No acute airspace disease or  effusion. Stable cardiomediastinal silhouette. No pneumothorax. Coarse appearing calcification overlying the right breast soft tissues. IMPRESSION: No active disease. Electronically Signed   By: Donavan Foil M.D.   On: 12/31/2022 20:13   CARDIAC CATHETERIZATION  Result Date: 12/31/2022 Conclusions: Significant multivessel coronary artery disease, as detailed below.  Culprit for the inferior STEMI are sequential 90% proximal/mid and 100% distal RCA lesions.  There is also a 70-80% distal LCx stenosis as well as 50-60% lesions involving OM1 and both diagonal branches. Challenging PCI to RCA with inability to pass anything other than a Tekeru 1.5 mm balloon to the distal RCA.  This allowed flow to be area established with improvement in distal RCA disease from 100% to 90% with TIMI-3 flow. Successful PCI to proximal/mid RCA stenosis using Synergy 2.5 x 20 mm drug-eluting stent with reduction in stenosis from 90% to less than 20% and reestablishment of TIMI-3 flow. Recommendations: Dual antiplatelet therapy with aspirin and ticagrelor for at least 12 months. Aggressive secondary prevention of coronary artery disease. Obtain echocardiogram for evaluation of left ventricular systolic function. [name], MD [facility]  Disposition   Pt is being discharged home today in good condition.  Follow-up Plans & Appointments     Contact information for follow-up providers     Furth, Cadence H, PA-C Follow up.   Specialty: Cardiology Why: Ezequiel Kayser - Linna Hoff location at [facility] - cardiology follow-up on Friday Jan 11, 2023 at 3:30 PM (Arrive by 3:15 PM). Contact information: [address] [place] [address] [phone]         Sharilyn Sites, MD Follow up in 1 week(s).   Specialty: Family Medicine Contact information: [address] [place] [address] [phone]              Contact information for after-discharge care     Destination      [facility] .   Service: Skilled Nursing Contact information: [place] [phone]                    Discharge Instructions     Amb Referral to Cardiac Rehabilitation   Complete by: As directed    Diagnosis:  Coronary Stents STEMI PTCA     After initial evaluation and assessments completed: Virtual Based Care  may be provided alone or in conjunction with Phase 2 Cardiac Rehab based on patient barriers.: Yes   Intensive Cardiac Rehabilitation (ICR) [facility] location only OR Traditional Cardiac Rehabilitation (TCR) *If criteria for ICR are not met will enroll in [facility] only): Yes   Diet - low sodium heart healthy   Complete by: As directed    Discharge wound care:   Complete by: As directed    Per documentation: patient has Pressure Injury 12/31/22 Sacrum Bilateral;Medial Stage 1 -  Intact skin with non-blanchable redness of a localized area usually over a bony prominence. 7x7.  Would recommend wound care be observed at nursing facility with frequent turning and close follow-up with primary care provider.   Increase activity slowly   Complete by: As directed    No driving. No lifting over 10 lbs for 4 weeks. No sexual activity for 4 weeks. Keep procedure site clean & dry. If you notice increased pain, swelling, bleeding or pus, call/return!  You may shower, but no soaking baths/hot tubs/pools for 1 week.        Discharge Medications   Allergies as of 01/04/2023       Reactions   Codeine Nausea And Vomiting   Glipizide    Hypoglycemia with glucoses recorded in the 20s necessitating glucagon and D10 IV   Sulfa Antibiotics Other (See Comments)   Unspecified         Medication List     STOP taking these medications    amLODipine 5 MG tablet Commonly known as: NORVASC   aspirin 325 MG tablet Replaced by: aspirin EC 81 MG tablet   clotrimazole 1 % cream Commonly  known as: LOTRIMIN   guaiFENesin-dextromethorphan 100-10 MG/5ML syrup Commonly known as: ROBITUSSIN DM   Lasix 20 MG tablet Generic drug: furosemide   metoprolol succinate 50 MG 24 hr tablet Commonly known as: TOPROL-XL   simvastatin 20 MG tablet Commonly known as: ZOCOR   solifenacin 5 MG tablet Commonly known as: VESICARE   Trospium Chloride 60 MG Cp24       TAKE these medications    acetaminophen 500 MG tablet Commonly known as: TYLENOL Take 500 mg by mouth every 6 (six) hours as needed for mild pain.   aspirin EC 81 MG tablet Take 1 tablet (81 mg total) by mouth daily. Swallow whole. Start taking on: January 05, 2023 Replaces: aspirin 325 MG tablet   BD AutoShield Duo 30G X 5 MM Misc Generic drug: Insulin Pen Needle by Does not apply route as directed. 30 gauge X 3/16   blood glucose meter kit and supplies Kit Dispense based on patient and insurance preference. Use to TEST BLOOD GLUCOSE THREE times daily as directed. DX E11.65   Chlorhexidine Gluconate Cloth 2 % Pads Apply 6 each topically daily at 6 (six) AM for 1 day. Last day needed is 01/05/23. Start taking on: January 05, 2023   diclofenac Sodium 1 % Gel Commonly known as: Voltaren Arthritis Pain Apply 2 g topically 3 (three) times daily. bilateral knees for osteoarthritis   gabapentin 300 MG capsule Commonly known as: NEURONTIN Take 300 mg three times daily   insulin glargine-yfgn 100 UNIT/ML Pen Commonly known as: SEMGLEE Inject 20 Units into the skin at bedtime.   insulin lispro 100 UNIT/ML injection Commonly known as: HUMALOG Inject 3 Units into the skin 3 (three) times daily before meals.   losartan 25 MG tablet Commonly known as: COZAAR Take 1 tablet (25 mg total) by mouth daily. Start  taking on: January 05, 2023   magnesium oxide 400 (240 Mg) MG tablet Commonly known as: MAG-OX Take 200 mg by mouth daily.   metFORMIN 500 MG tablet Commonly known as: GLUCOPHAGE Take 500 mg by mouth  daily.   metoprolol tartrate 25 MG tablet Commonly known as: LOPRESSOR Take 1 tablet (25 mg total) by mouth 2 (two) times daily.   mupirocin ointment 2 % Commonly known as: BACTROBAN Place 1 Application into the nose 2 (two) times daily for 2 doses. Last day needed is 01/05/23 AM.   nitroGLYCERIN 0.4 MG SL tablet Commonly known as: NITROSTAT Place 1 tablet (0.4 mg total) under the tongue every 5 (five) minutes as needed for chest pain (up to 3 doses. If taking 3rd dose, call 911).   rosuvastatin 20 MG tablet Commonly known as: CRESTOR Take 1 tablet (20 mg total) by mouth daily. Start taking on: January 05, 2023   silodosin 8 MG Caps capsule Commonly known as: RAPAFLO Take 1 capsule (8 mg total) by mouth daily with breakfast.   ticagrelor 90 MG Tabs tablet Commonly known as: BRILINTA Take 1 tablet (90 mg total) by mouth 2 (two) times daily.   Vitamin D3 125 MCG (5000 UT) Caps Take 1 capsule (5,000 Units total) by mouth daily.   Zoloft 50 MG tablet Generic drug: sertraline Take 50 mg by mouth daily. Take with '25mg'$  dose to equal '75mg'$  daily.   sertraline 25 MG tablet Commonly known as: ZOLOFT Take 25 mg by mouth daily. Take with '50mg'$  dose to equal '75mg'$  daily.               Discharge Care Instructions  (From admission, onward)           Start     Ordered   01/04/23 0000  Discharge wound care:       Comments: Per documentation: patient has Pressure Injury 12/31/22 Sacrum Bilateral;Medial Stage 1 -  Intact skin with non-blanchable redness of a localized area usually over a bony prominence. 7x7.  Would recommend wound care be observed at nursing facility with frequent turning and close follow-up with primary care provider.   01/04/23 1129               Outstanding Labs/Studies   See above for recommendations  Duration of Discharge Encounter   Greater than 30 minutes including physician time.  Signed, Charlie Pitter, PA-C 01/04/2023, 11:30 AM

## 2023-01-02 NOTE — TOC Progression Note (Signed)
Transition of Care [facility]) - Progression Note    Patient Details  Name: [name] MRN: [id number] Date of Birth: Nov 18, 1929  Transition of Care [facility]) CM/SW [place], [place] Phone Number: 01/02/2023, 5:00 PM  Clinical Narrative:     CSW provided patient and patients son with SNF bed offers. Patient and patients son accepted SNF bed offer with [facility]. CSW spoke with Debbie with [facility] who confirmed SNF bed offer for patient. CSW started insurance authorization for patient. [place] ID # [id number]. CSW will continue to follow and assist with patients dc planning needs.   Expected Discharge Plan: Assisted Living ([facility]) Barriers to Discharge: Continued Medical Work up  Expected Discharge Plan and Services In-house Referral: Clinical Social Work     Living arrangements for the past 2 months: [place]                                       Social Determinants of Health (SDOH) Interventions SDOH Screenings   Food Insecurity: No Food Insecurity (01/02/2023)  Housing: Low Risk  (01/02/2023)  Transportation Needs: No Transportation Needs (01/02/2023)  Utilities: Not At Risk (01/02/2023)  Physical Activity: Inactive (02/08/2019)  Social Connections: Unknown (02/08/2019)  Stress: No Stress Concern Present (02/08/2019)  Tobacco Use: Low Risk  (01/01/2023)    Readmission Risk Interventions     No data to display

## 2023-01-02 NOTE — TOC Initial Note (Addendum)
Transition of Care [facility]) - Initial/Assessment Note    Patient Details  Name: [name] MRN: [id number] Date of Birth: Jun 01, 1929  Transition of Care [facility]) CM/SW Contact:    Milas Gain, Page Phone Number: 01/02/2023, 10:10 AM  Clinical Narrative:                  Update- CSW spoke with patient, patients spouse, and patients son who is all agreeable to PT/OT current recommendations for SNF placement for patient. Patient and patients son gave CSW permission to fax out patients initial referral near the [place] area for review. CSW awaiting SNF bed offers.    CSW spoke with patient and patients spouse regarding dc plan. Patient informed CSW will need to speak with her spouse regarding her dc plan. Patient gave permission for CSW to speak with her spouse.Patients spouse confirmed plan is for patient to return back to ALF when medically ready for dc. Patients spouse informed CSW patient will have transportation when medically ready for dc.CSW called [facility] who informed CSW that they are requesting patient go to a SNF before returning back to their facility. CSW informed ALF that PT/OT saw patient and is recommending Home health PT at ALF.Sandy with [facility] informed CSW ALF will need to come assess patient today to determine if she is able to return back to facility today. CSW informed MD,RN,CM. CSW will continue to follow and assist with patients dc planning needs.    Patient Goals and CMS Choice            Expected Discharge Plan and Services                                              Prior Living Arrangements/Services                       Activities of Daily Living Home Assistive Devices/Equipment: Wheelchair ADL Screening (condition at time of admission) Patient's cognitive ability adequate to safely complete daily activities?: Yes Is the patient deaf or have difficulty hearing?: Yes Does the patient have  difficulty seeing, even when wearing glasses/contacts?: No Does the patient have difficulty concentrating, remembering, or making decisions?: No Patient able to express need for assistance with ADLs?: Yes Does the patient have difficulty dressing or bathing?: Yes Independently performs ADLs?: No Communication: Independent Dressing (OT): Needs assistance Is this a change from baseline?: Pre-admission baseline Grooming: Independent Feeding: Independent Bathing: Dependent Is this a change from baseline?: Pre-admission baseline Toileting: Needs assistance Is this a change from baseline?: Pre-admission baseline In/Out Bed: Needs assistance Is this a change from baseline?: Pre-admission baseline Walks in Home: Dependent Is this a change from baseline?: Pre-admission baseline Does the patient have difficulty walking or climbing stairs?: Yes Weakness of Legs: Both Weakness of Arms/Hands: None  Permission Sought/Granted                  Emotional Assessment              Admission diagnosis:  Acute ST elevation myocardial infarction (STEMI) of inferior wall (HCC) [I21.19] Patient Active Problem List   Diagnosis Date Noted   Acute ST elevation myocardial infarction (STEMI) of inferior wall ([place]) 12/31/2022   SARS-CoV-2 positive 08/24/2022   SARS-CoV-2 antibody positive 08/23/2022   Emphysema of lung ([place]) 08/21/2022   Vascular dementia  without behavioral disturbance ([place]) 08/21/2022   Decubitus ulcer of buttock, stage 2 ([place]) 08/13/2022   Hypomagnesemia 08/03/2022   Hypoglycemia associated with diabetes ([place]) 08/02/2022   Hypokalemia 08/02/2022   Poorly controlled type 2 diabetes mellitus with neuropathy ([place]) 07/19/2022   Diabetic neuropathy associated with type 2 diabetes mellitus ([place]) 07/16/2022   Hypertension associated with type 2 diabetes mellitus ([place]) 07/16/2022   Hyperlipidemia associated with type 2 diabetes mellitus ([place]) 07/16/2022   Chronic insomnia 07/16/2022    Bilateral lower extremity edema 07/16/2022   Chronic constipation 07/16/2022   Aortic atherosclerosis ([place]) 07/16/2022   Major neurocognitive disorder ([place]) 07/16/2022   DJD (degenerative joint disease) of knee 07/14/2022   UTI (urinary tract infection) 07/09/2022   Late effects of cerebrovascular disease 09/25/2021   Overactive bladder 16/55/3748   Acute metabolic encephalopathy 27/06/8674   Cellulitis of buttock 07/14/2019   Hyperlipidemia 02/20/2016   Essential hypertension 10/10/2015   Controlled type 2 diabetes with neuropathy ([place]) 10/01/2008   PCP:  Sharilyn Sites, MD Pharmacy:   [facility] [place] [address] Phone: [phone] Fax: [place], [place] - [address] [place] [place] [address] Phone: [phone] Fax: [phone]     Social Determinants of Health (SDOH) Social History: SDOH Screenings   Food Insecurity: No Food Insecurity (02/08/2019)  Transportation Needs: No Transportation Needs (02/08/2019)  Physical Activity: Inactive (02/08/2019)  Social Connections: Unknown (02/08/2019)  Stress: No Stress Concern Present (02/08/2019)  Tobacco Use: Low Risk  (01/01/2023)   SDOH Interventions:     Readmission Risk Interventions     No data to display

## 2023-01-02 NOTE — Progress Notes (Addendum)
Rounding Note    Patient Name: [name] Date of Encounter: 01/02/2023  [place] Cardiologist: None   Subjective   Feeling well this morning.   Inpatient Medications    Scheduled Meds:  aspirin  81 mg Oral Daily   Chlorhexidine Gluconate Cloth  6 each Topical Daily   Chlorhexidine Gluconate Cloth  6 each Topical Q0600   heparin  5,000 Units Subcutaneous Q8H   insulin aspart  0-15 Units Subcutaneous TID WC   insulin glargine-yfgn  20 Units Subcutaneous QHS   metFORMIN  500 mg Oral Q breakfast   metoprolol tartrate  25 mg Oral BID   mupirocin ointment  1 Application Nasal BID   rosuvastatin  20 mg Oral Daily   sertraline  75 mg Oral Daily   sodium chloride flush  3 mL Intravenous Q12H   ticagrelor  90 mg Oral BID   Continuous Infusions:  sodium chloride     PRN Meds: sodium chloride, acetaminophen, nitroGLYCERIN, ondansetron (ZOFRAN) IV, sodium chloride flush, zolpidem   Vital Signs    Vitals:   01/01/23 2200 01/01/23 2330 01/02/23 0504 01/02/23 0746  BP: (!) 155/63 (!) 140/57 (!) 148/67 (!) 120/96  Pulse: 76 67 69 74  Resp: '15 18 16 15  '$ Temp:  98.1 F (36.7 C) 98.1 F (36.7 C) 98 F (36.7 C)  TempSrc:  Oral Oral Oral  SpO2: 93% 96% 95% 97%    Intake/Output Summary (Last 24 hours) at 01/02/2023 1025 Last data filed at 01/02/2023 0800 Gross per 24 hour  Intake 360 ml  Output 452 ml  Net -92 ml      10/10/2022    4:08 PM 09/06/2022    9:46 AM 09/03/2022    2:55 PM  Last 3 Weights  Weight (lbs) 175 lb 6.4 oz 173 lb 6.4 oz 173 lb 12.8 oz  Weight (kg) 79.561 kg 78.654 kg 78.835 kg      Telemetry    Sinus Rhythm, intermittent bradycardia - Personally Reviewed  ECG    No new tracing  Physical Exam   GEN: Frail older female, no acute distress.   Neck: No JVD Cardiac: RRR, no murmurs, rubs, or gallops.  Respiratory: Clear to auscultation bilaterally. GI: Soft, nontender, non-distended  MS: No edema; No deformity. Right  femoral cath site.  Neuro:  Nonfocal  Psych: Normal affect   Labs    High Sensitivity Troponin:   Recent Labs  Lab 12/31/22 2020  TROPONINIHS >24,000*     Chemistry Recent Labs  Lab 12/31/22 1657 12/31/22 2020 01/01/23 0233  NA 137 134* 134*  K 4.1 3.6 3.6  CL 102 102 103  CO2  --  22 20*  GLUCOSE 228* 222* 253*  BUN '17 16 18  '$ CREATININE 0.60 0.74 0.83  CALCIUM  --  9.1 9.2  PROT  --  6.0*  --   ALBUMIN  --  3.0*  --   AST  --  89*  --   ALT  --  24  --   ALKPHOS  --  76  --   BILITOT  --  0.8  --   GFRNONAA  --  >60 >60  ANIONGAP  --  10 11    Lipids  Recent Labs  Lab 01/01/23 0233  CHOL 135  TRIG 70  HDL 50  LDLCALC 71  CHOLHDL 2.7    Hematology Recent Labs  Lab 12/31/22 1657 12/31/22 2020 01/01/23 0233  WBC  --  11.0* 12.5*  RBC  --  4.21 4.01  HGB 13.6 12.2 11.6*  HCT 40.0 36.8 34.8*  MCV  --  87.4 86.8  MCH  --  29.0 28.9  MCHC  --  33.2 33.3  RDW  --  13.2 13.2  PLT  --  233 224   Thyroid  Recent Labs  Lab 12/31/22 2020  TSH 2.673    BNP Recent Labs  Lab 12/31/22 2020  BNP 510.0*    DDimer No results for input(s): "DDIMER" in the last 168 hours.   Radiology    ECHOCARDIOGRAM COMPLETE  Result Date: 01/01/2023    ECHOCARDIOGRAM REPORT   Patient Name:   [name] Date of Exam: 01/01/2023 Medical Rec #:  [id number]           Height:       69.0 in Accession #:    [id number]          Weight:       175.4 lb Date of Birth:  [birth year]          BSA:          1.954 m Patient Age:    23 years            BP:           151/54 mmHg Patient Gender: F                   HR:           78 bpm. Exam Location:  Inpatient Procedure: 2D Echo, Color Doppler, Cardiac Doppler and Intracardiac            Opacification Agent Indications:    Acute ischemic heart disease  History:        Patient has prior history of Echocardiogram examinations, most                 recent 03/01/2020. Acute MI, Stroke, Signs/Symptoms:Chest Pain;                 Risk  Factors:Diabetes and Hypertension.  Sonographer:    Eartha Inch Referring Phys: [phone] Bailynn Dyk  Sonographer Comments: Technically difficult study due to poor echo windows. Image acquisition challenging due to patient body habitus and Image acquisition challenging due to respiratory motion. IMPRESSIONS  1. Left ventricular ejection fraction, by estimation, is 60 to 65%. The left ventricle has normal function. The left ventricle has no regional wall motion abnormalities. There is mild concentric left ventricular hypertrophy. Left ventricular diastolic parameters are consistent with Grade I diastolic dysfunction (impaired relaxation).  2. Right ventricular systolic function is normal. The right ventricular size is normal. Tricuspid regurgitation signal is inadequate for assessing PA pressure.  3. The mitral valve is degenerative. Trivial mitral valve regurgitation. No evidence of mitral stenosis.  4. The aortic valve is tricuspid. There is mild calcification of the aortic valve. There is mild thickening of the aortic valve. Aortic valve regurgitation is not visualized. Aortic valve sclerosis is present, with no evidence of aortic valve stenosis.  5. The inferior vena cava is normal in size with greater than 50% respiratory variability, suggesting right atrial pressure of 3 mmHg. FINDINGS  Left Ventricle: Left ventricular ejection fraction, by estimation, is 60 to 65%. The left ventricle has normal function. The left ventricle has no regional wall motion abnormalities. Definity contrast agent was given IV to delineate the left ventricular  endocardial borders. The left ventricular internal cavity size was normal in size. There is  mild concentric left ventricular hypertrophy. Left ventricular diastolic parameters are consistent with Grade I diastolic dysfunction (impaired relaxation). Right Ventricle: The right ventricular size is normal. No increase in right ventricular wall thickness. Right ventricular  systolic function is normal. Tricuspid regurgitation signal is inadequate for assessing PA pressure. Left Atrium: Left atrial size was normal in size. Right Atrium: Right atrial size was normal in size. Pericardium: There is no evidence of pericardial effusion. Mitral Valve: The mitral valve is degenerative in appearance. Mild to moderate mitral annular calcification. Trivial mitral valve regurgitation. No evidence of mitral valve stenosis. Tricuspid Valve: The tricuspid valve is grossly normal. Tricuspid valve regurgitation is trivial. No evidence of tricuspid stenosis. Aortic Valve: The aortic valve is tricuspid. There is mild calcification of the aortic valve. There is mild thickening of the aortic valve. Aortic valve regurgitation is not visualized. Aortic valve sclerosis is present, with no evidence of aortic valve stenosis. Pulmonic Valve: The pulmonic valve was grossly normal. Pulmonic valve regurgitation is not visualized. No evidence of pulmonic stenosis. Aorta: The aortic root and ascending aorta are structurally normal, with no evidence of dilitation. Venous: The inferior vena cava is normal in size with greater than 50% respiratory variability, suggesting right atrial pressure of 3 mmHg. IAS/Shunts: The atrial septum is grossly normal.  LEFT VENTRICLE PLAX 2D LVIDd:         3.80 cm     Diastology LVIDs:         2.40 cm     LV e' medial:    3.75 cm/s LV PW:         1.20 cm     LV E/e' medial:  29.9 LV IVS:        1.30 cm     LV e' lateral:   7.81 cm/s LVOT diam:     2.10 cm     LV E/e' lateral: 14.3 LV SV:         91 LV SV Index:   47 LVOT Area:     3.46 cm  LV Volumes (MOD) LV vol d, MOD A2C: 62.4 ml LV vol d, MOD A4C: 85.0 ml LV vol s, MOD A2C: 27.2 ml LV vol s, MOD A4C: 13.3 ml LV SV MOD A2C:     35.2 ml LV SV MOD A4C:     85.0 ml LV SV MOD BP:      52.2 ml RIGHT VENTRICLE            IVC RV S prime:     9.99 cm/s  IVC diam: 1.60 cm TAPSE (M-mode): 1.8 cm LEFT ATRIUM             Index        RIGHT  ATRIUM           Index LA diam:        3.40 cm 1.74 cm/m   RA Area:     13.40 cm LA Vol (A2C):   36.8 ml 18.83 ml/m  RA Volume:   32.00 ml  16.38 ml/m LA Vol (A4C):   27.2 ml 13.92 ml/m LA Biplane Vol: 32.7 ml 16.74 ml/m  AORTIC VALVE LVOT Vmax:   103.00 cm/s LVOT Vmean:  77.000 cm/s LVOT VTI:    0.264 m  AORTA Ao Root diam: 2.90 cm Ao Asc diam:  3.30 cm MITRAL VALVE MV Area (PHT): 2.48 cm     SHUNTS MV Decel Time: 306 msec     Systemic VTI:  0.26 m MR  Peak grad: 17.5 mmHg     Systemic Diam: 2.10 cm MR Vmax:      209.00 cm/s MV E velocity: 112.00 cm/s MV A velocity: 114.00 cm/s MV E/A ratio:  0.98 Eleonore Chiquito MD Electronically signed by Eleonore Chiquito MD Signature Date/Time: 01/01/2023/1:15:54 PM    Final    Portable chest x-ray 1 view  Result Date: 12/31/2022 CLINICAL DATA:  STEMI EXAM: PORTABLE CHEST 1 VIEW COMPARISON:  08/19/2022 FINDINGS: No acute airspace disease or effusion. Stable cardiomediastinal silhouette. No pneumothorax. Coarse appearing calcification overlying the right breast soft tissues. IMPRESSION: No active disease. Electronically Signed   By: Donavan Foil M.D.   On: 12/31/2022 20:13   CARDIAC CATHETERIZATION  Result Date: 12/31/2022 Conclusions: Significant multivessel coronary artery disease, as detailed below.  Culprit for the inferior STEMI are sequential 90% proximal/mid and 100% distal RCA lesions.  There is also a 70-80% distal LCx stenosis as well as 50-60% lesions involving OM1 and both diagonal branches. Challenging PCI to RCA with inability to pass anything other than a [place] 1.5 mm balloon to the distal RCA.  This allowed flow to be area established with improvement in distal RCA disease from 100% to 90% with TIMI-3 flow. Successful PCI to proximal/mid RCA stenosis using Synergy 2.5 x 20 mm drug-eluting stent with reduction in stenosis from 90% to less than 20% and reestablishment of TIMI-3 flow. Recommendations: Dual antiplatelet therapy with aspirin and ticagrelor for  at least 12 months. Aggressive secondary prevention of coronary artery disease. Obtain echocardiogram for evaluation of left ventricular systolic function. [name], MD [facility]   Cardiac Studies   Cath: 12/31/2022   Conclusions: Significant multivessel coronary artery disease, as detailed below.  Culprit for the inferior STEMI are sequential 90% proximal/mid and 100% distal RCA lesions.  There is also a 70-80% distal LCx stenosis as well as 50-60% lesions involving OM1 and both diagonal branches. Challenging PCI to RCA with inability to pass anything other than a [place] 1.5 mm balloon to the distal RCA.  This allowed flow to be area established with improvement in distal RCA disease from 100% to 90% with TIMI-3 flow. Successful PCI to proximal/mid RCA stenosis using Synergy 2.5 x 20 mm drug-eluting stent with reduction in stenosis from 90% to less than 20% and reestablishment of TIMI-3 flow.   Recommendations: Dual antiplatelet therapy with aspirin and ticagrelor for at least 12 months. Aggressive secondary prevention of coronary artery disease. Obtain echocardiogram for evaluation of left ventricular systolic function.   [name], MD [facility]   Diagnostic Dominance: Right  Intervention     Echo: 01/01/2023  IMPRESSIONS     1. Left ventricular ejection fraction, by estimation, is 60 to 65%. The  left ventricle has normal function. The left ventricle has no regional  wall motion abnormalities. There is mild concentric left ventricular  hypertrophy. Left ventricular diastolic  parameters are consistent with Grade I diastolic dysfunction (impaired  relaxation).   2. Right ventricular systolic function is normal. The right ventricular  size is normal. Tricuspid regurgitation signal is inadequate for assessing  PA pressure.   3. The mitral valve is degenerative. Trivial mitral valve regurgitation.  No evidence of mitral stenosis.   4. The aortic valve is  tricuspid. There is mild calcification of the  aortic valve. There is mild thickening of the aortic valve. Aortic valve  regurgitation is not visualized. Aortic valve sclerosis is present, with  no evidence of aortic valve stenosis.   5. The inferior  vena cava is normal in size with greater than 50%  respiratory variability, suggesting right atrial pressure of 3 mmHg.   FINDINGS   Left Ventricle: Left ventricular ejection fraction, by estimation, is 60  to 65%. The left ventricle has normal function. The left ventricle has no  regional wall motion abnormalities. Definity contrast agent was given IV  to delineate the left ventricular   endocardial borders. The left ventricular internal cavity size was normal  in size. There is mild concentric left ventricular hypertrophy. Left  ventricular diastolic parameters are consistent with Grade I diastolic  dysfunction (impaired relaxation).   Right Ventricle: The right ventricular size is normal. No increase in  right ventricular wall thickness. Right ventricular systolic function is  normal. Tricuspid regurgitation signal is inadequate for assessing PA  pressure.   Left Atrium: Left atrial size was normal in size.   Right Atrium: Right atrial size was normal in size.   Pericardium: There is no evidence of pericardial effusion.   Mitral Valve: The mitral valve is degenerative in appearance. Mild to  moderate mitral annular calcification. Trivial mitral valve regurgitation.  No evidence of mitral valve stenosis.   Tricuspid Valve: The tricuspid valve is grossly normal. Tricuspid valve  regurgitation is trivial. No evidence of tricuspid stenosis.   Aortic Valve: The aortic valve is tricuspid. There is mild calcification  of the aortic valve. There is mild thickening of the aortic valve. Aortic  valve regurgitation is not visualized. Aortic valve sclerosis is present,  with no evidence of aortic valve  stenosis.   Pulmonic Valve: The  pulmonic valve was grossly normal. Pulmonic valve  regurgitation is not visualized. No evidence of pulmonic stenosis.   Aorta: The aortic root and ascending aorta are structurally normal, with  no evidence of dilitation.   Venous: The inferior vena cava is normal in size with greater than 50%  respiratory variability, suggesting right atrial pressure of 3 mmHg.   IAS/Shunts: The atrial septum is grossly normal.   Patient Profile     89 y.o. female with hx of DM, HTN, and prior stroke, presents with acute inferior STEMI with late presentation (>12 hours symptoms), treated with primary PCI of the RCA 12/31/22   Assessment & Plan    STEMI -- Underwent cardiac catheterization noted above with significant multivessel disease but culprit lesion being 90% proximal/mid and distal 100% RCA lesions.  Challenging PCI to the RCA with successful attempt to pass balloon into the distal RCA which allow flow to be established with minimal improvement of 100% to 90% residual disease.  Successful PCI/DESx1 to prox/mid RCA.  High-sensitivity troponin peaked at greater than 24,000.  Follow-up cardiogram showed LVEF of 60 to 06%, grade 1 diastolic dysfunction, no regional wall motion abnormality, normal RV size and function. Recommendations for DAPT with aspirin/Brilinta for at least 1 year. -- Continue aspirin, Brilinta, metoprolol 25 mg twice daily, Crestor 20 mg daily  DM -- Hemoglobin A1c 7.2 -- SSI while inpatient -- Resume metformin at discharge  HTN -- Reasonably controlled -- Continue metoprolol 25 mg twice daily  HLD -- LDL 71, HDL 50 -- Continue Crestor 20 mg daily -- LFT/FLP in 8 weeks  Dispo: Seen by PT/OT with recommendations to return to assisted living facility.  She is otherwise stable for discharge.  She is pending disposition based off ALF availability to take patient  For questions or updates, please contact [place] Please consult [email or web address] for contact info under  Signed, Reino Bellis, NP  01/02/2023, 10:25 AM    Patient seen, examined. Available data reviewed. Agree with findings, assessment, and plan as outlined by Reino Bellis, NP.  The patient is independently interviewed and examined.  She looks great this morning.  She has no chest pain or shortness of breath.  On my exam, she is alert, oriented, elderly woman in no distress.  Lungs are clear bilaterally, heart is regular rate and rhythm no murmur gallop, abdomen is soft and nontender, extremities have no edema, skin is warm and dry with no rash.  Telemetry is reviewed shows sinus rhythm/sinus bradycardia without significant arrhythmia.  Her medical program is reviewed and she is now medically stable for discharge.  Her LVEF is preserved at 60 to 65% after presenting with an inferior wall MI.  She has had no recurrent angina during her hospitalization.  The only issue now relates to her disposition.  She has been living in an assisted living facility.  She has been seen here by physical therapy and Occupational Therapy who recommend they return to assisted living with continued home health services.  However, the facility is concerned that she may need skilled nursing and they are sending someone to assess her.  On the other hand, the patient wants to go home because of cost issues at the assisted living facility.  She understands that this is not medically indicated at present and she will need to at least return to assisted living at her previous level of care.  Once this is worked out, she is medically stable for discharge.  Sherren Mocha, M.D. 01/02/2023 10:39 AM

## 2023-01-02 NOTE — Progress Notes (Signed)
Discussed with pt MI, stent, antiplatelet and NTG. Pt receptive. She is currently unable to stand independently and therefore is not appropriate for CRPII. However will place referral for AP CRPII to cover protocol. [id number] Yves Dill BS, ACSM-CEP 01/02/2023 12:01 PM

## 2023-01-03 DIAGNOSIS — I2119 ST elevation (STEMI) myocardial infarction involving other coronary artery of inferior wall: Secondary | ICD-10-CM | POA: Diagnosis not present

## 2023-01-03 LAB — GLUCOSE, CAPILLARY
Glucose-Capillary: 84 mg/dL (ref 70–99)
Glucose-Capillary: 122 mg/dL — ABNORMAL HIGH (ref 70–99)
Glucose-Capillary: 112 mg/dL — ABNORMAL HIGH (ref 70–99)
Glucose-Capillary: 121 mg/dL — ABNORMAL HIGH (ref 70–99)

## 2023-01-03 MED ORDER — LOSARTAN POTASSIUM 25 MG PO TABS
25.0000 mg | ORAL_TABLET | Freq: Every day | ORAL | Status: DC
  Administered 2023-01-03 – 2023-01-04 (×2): 25 mg via ORAL
  Filled 2023-01-03 (×2): qty 1

## 2023-01-03 NOTE — Progress Notes (Addendum)
Rounding Note    Patient Name: [name] Date of Encounter: 01/03/2023  [place] Cardiologist: None   Subjective   Feeling well this morning. No chest pain. Pending SNF bed approval  Inpatient Medications    Scheduled Meds:  aspirin  81 mg Oral Daily   Chlorhexidine Gluconate Cloth  6 each Topical Daily   Chlorhexidine Gluconate Cloth  6 each Topical Q0600   heparin  5,000 Units Subcutaneous Q8H   insulin aspart  0-15 Units Subcutaneous TID WC   insulin glargine-yfgn  20 Units Subcutaneous QHS   metFORMIN  500 mg Oral Q breakfast   metoprolol tartrate  25 mg Oral BID   mupirocin ointment  1 Application Nasal BID   rosuvastatin  20 mg Oral Daily   sertraline  75 mg Oral Daily   sodium chloride flush  3 mL Intravenous Q12H   ticagrelor  90 mg Oral BID   Continuous Infusions:  sodium chloride     PRN Meds: sodium chloride, acetaminophen, nitroGLYCERIN, ondansetron (ZOFRAN) IV, sodium chloride flush, zolpidem   Vital Signs    Vitals:   01/02/23 2033 01/03/23 0128 01/03/23 0333 01/03/23 0750  BP: (!) 140/54 (!) 152/67 (!) 151/59 130/67  Pulse: 72 64 65   Resp: '12 13 18 12  '$ Temp: 98.4 F (36.9 C) 98.4 F (36.9 C) 97.8 F (36.6 C) 98 F (36.7 C)  TempSrc: Oral Oral Oral   SpO2: 95% 96% 98%   Weight:   77.6 kg     Intake/Output Summary (Last 24 hours) at 01/03/2023 1022 Last data filed at 01/03/2023 1010 Gross per 24 hour  Intake 243 ml  Output 1100 ml  Net -857 ml      01/03/2023    3:33 AM 10/10/2022    4:08 PM 09/06/2022    9:46 AM  Last 3 Weights  Weight (lbs) 171 lb 1.2 oz 175 lb 6.4 oz 173 lb 6.4 oz  Weight (kg) 77.6 kg 79.561 kg 78.654 kg      Telemetry    Sinus Rhythm, 60 - Personally Reviewed  ECG    No new tracing  Physical Exam   GEN: Pleasant older female, sitting up in bed. Neck: No JVD Cardiac: RRR, no murmurs, rubs, or gallops.  Respiratory: Clear to auscultation bilaterally. GI: Soft, nontender,  non-distended  MS: No edema; No deformity. Right femoral cath site stable.  Neuro:  Nonfocal  Psych: Normal affect   Labs    High Sensitivity Troponin:   Recent Labs  Lab 12/31/22 2020  TROPONINIHS >24,000*     Chemistry Recent Labs  Lab 12/31/22 1657 12/31/22 2020 01/01/23 0233  NA 137 134* 134*  K 4.1 3.6 3.6  CL 102 102 103  CO2  --  22 20*  GLUCOSE 228* 222* 253*  BUN '17 16 18  '$ CREATININE 0.60 0.74 0.83  CALCIUM  --  9.1 9.2  PROT  --  6.0*  --   ALBUMIN  --  3.0*  --   AST  --  89*  --   ALT  --  24  --   ALKPHOS  --  76  --   BILITOT  --  0.8  --   GFRNONAA  --  >60 >60  ANIONGAP  --  10 11    Lipids  Recent Labs  Lab 01/01/23 0233  CHOL 135  TRIG 70  HDL 50  LDLCALC 71  CHOLHDL 2.7    Hematology Recent Labs  Lab 12/31/22 1657 12/31/22 2020 01/01/23 0233  WBC  --  11.0* 12.5*  RBC  --  4.21 4.01  HGB 13.6 12.2 11.6*  HCT 40.0 36.8 34.8*  MCV  --  87.4 86.8  MCH  --  29.0 28.9  MCHC  --  33.2 33.3  RDW  --  13.2 13.2  PLT  --  233 224   Thyroid  Recent Labs  Lab 12/31/22 2020  TSH 2.673    BNP Recent Labs  Lab 12/31/22 2020  BNP 510.0*    DDimer No results for input(s): "DDIMER" in the last 168 hours.   Radiology    No results found.  Cardiac Studies   Cath: 12/31/2022   Conclusions: Significant multivessel coronary artery disease, as detailed below.  Culprit for the inferior STEMI are sequential 90% proximal/mid and 100% distal RCA lesions.  There is also a 70-80% distal LCx stenosis as well as 50-60% lesions involving OM1 and both diagonal branches. Challenging PCI to RCA with inability to pass anything other than a [place] 1.5 mm balloon to the distal RCA.  This allowed flow to be area established with improvement in distal RCA disease from 100% to 90% with TIMI-3 flow. Successful PCI to proximal/mid RCA stenosis using Synergy 2.5 x 20 mm drug-eluting stent with reduction in stenosis from 90% to less than 20% and  reestablishment of TIMI-3 flow.   Recommendations: Dual antiplatelet therapy with aspirin and ticagrelor for at least 12 months. Aggressive secondary prevention of coronary artery disease. Obtain echocardiogram for evaluation of left ventricular systolic function.   [name], MD [facility]   Diagnostic Dominance: Right  Intervention      Echo: 01/01/2023   IMPRESSIONS     1. Left ventricular ejection fraction, by estimation, is 60 to 65%. The  left ventricle has normal function. The left ventricle has no regional  wall motion abnormalities. There is mild concentric left ventricular  hypertrophy. Left ventricular diastolic  parameters are consistent with Grade I diastolic dysfunction (impaired  relaxation).   2. Right ventricular systolic function is normal. The right ventricular  size is normal. Tricuspid regurgitation signal is inadequate for assessing  PA pressure.   3. The mitral valve is degenerative. Trivial mitral valve regurgitation.  No evidence of mitral stenosis.   4. The aortic valve is tricuspid. There is mild calcification of the  aortic valve. There is mild thickening of the aortic valve. Aortic valve  regurgitation is not visualized. Aortic valve sclerosis is present, with  no evidence of aortic valve stenosis.   5. The inferior vena cava is normal in size with greater than 50%  respiratory variability, suggesting right atrial pressure of 3 mmHg.   FINDINGS   Left Ventricle: Left ventricular ejection fraction, by estimation, is 60  to 65%. The left ventricle has normal function. The left ventricle has no  regional wall motion abnormalities. Definity contrast agent was given IV  to delineate the left ventricular   endocardial borders. The left ventricular internal cavity size was normal  in size. There is mild concentric left ventricular hypertrophy. Left  ventricular diastolic parameters are consistent with Grade I diastolic  dysfunction  (impaired relaxation).   Right Ventricle: The right ventricular size is normal. No increase in  right ventricular wall thickness. Right ventricular systolic function is  normal. Tricuspid regurgitation signal is inadequate for assessing PA  pressure.   Left Atrium: Left atrial size was normal in size.   Right Atrium: Right atrial size  was normal in size.   Pericardium: There is no evidence of pericardial effusion.   Mitral Valve: The mitral valve is degenerative in appearance. Mild to  moderate mitral annular calcification. Trivial mitral valve regurgitation.  No evidence of mitral valve stenosis.   Tricuspid Valve: The tricuspid valve is grossly normal. Tricuspid valve  regurgitation is trivial. No evidence of tricuspid stenosis.   Aortic Valve: The aortic valve is tricuspid. There is mild calcification  of the aortic valve. There is mild thickening of the aortic valve. Aortic  valve regurgitation is not visualized. Aortic valve sclerosis is present,  with no evidence of aortic valve  stenosis.   Pulmonic Valve: The pulmonic valve was grossly normal. Pulmonic valve  regurgitation is not visualized. No evidence of pulmonic stenosis.   Aorta: The aortic root and ascending aorta are structurally normal, with  no evidence of dilitation.   Venous: The inferior vena cava is normal in size with greater than 50%  respiratory variability, suggesting right atrial pressure of 3 mmHg.   IAS/Shunts: The atrial septum is grossly normal.   Patient Profile     89 y.o. female  with hx of DM, HTN, and prior stroke, presents with acute inferior STEMI with late presentation (>12 hours symptoms), treated with primary PCI of the RCA 12/31/22    Assessment & Plan    STEMI -- Underwent cardiac catheterization noted above with significant multivessel disease but culprit lesion being 90% proximal/mid and distal 100% RCA lesions.  Challenging PCI to the RCA with successful attempt to pass balloon  into the distal RCA which allow flow to be established with minimal improvement of 100% to 90% residual disease.  Successful PCI/DESx1 to prox/mid RCA.  High-sensitivity troponin peaked at greater than 24,000.  Follow-up cardiogram showed LVEF of 60 to 13%, grade 1 diastolic dysfunction, no regional wall motion abnormality, normal RV size and function. Recommendations for DAPT with aspirin/Brilinta for at least 1 year. -- Continue aspirin, Brilinta, metoprolol 25 mg twice daily, Crestor 20 mg daily   DM -- Hemoglobin A1c 7.2 -- SSI while inpatient -- metformin PTA   HTN -- Reasonably controlled -- Continue metoprolol 25 mg twice daily   HLD -- LDL 71, HDL 50 -- Continue Crestor 20 mg daily -- LFT/FLP in 8 weeks   Dispo: Seen by PT/OT with recommendations now for SNF placement. Has bed assignment pending insurance approval.   For questions or updates, please contact [facility] Please consult [email or web address] for contact info under        Signed, Reino Bellis, NP  01/03/2023, 10:22 AM    Patient seen, examined. Available data reviewed. Agree with findings, assessment, and plan as outlined by Reino Bellis, NP.  Patient is independently interviewed and examined.  She has no new complaints this morning and specifically denies chest pain or shortness of breath.  On my exam, she is alert, oriented, elderly woman in no distress.  HEENT is normal, lungs are clear bilaterally, heart is regular rate and rhythm no murmur gallop, abdomen soft nontender, extremities have no edema, skin is warm and dry with no rash and there are some small ecchymoses on the arms.  The patient is medically stable for hospital discharge.  I agree with the problem list and treatment plan outlined above.  Her medications are reviewed and appropriate for treatment of her recent STEMI with dual antiplatelet therapy using aspirin and ticagrelor, high intensity statin drug, and a beta-blocker.  Since the patient  has coronary artery  disease and diabetes, we will add a low-dose of losartan.  I have reviewed her vital signs and I think her blood pressure will tolerate this.  Sherren Mocha, M.D. 01/03/2023 11:24 AM

## 2023-01-03 NOTE — Progress Notes (Signed)
Occupational Therapy Evaluation Patient Details Name: [name] MRN: [id number] DOB: [birth year] Today's Date: 01/03/2023   History of Present Illness Patient is a 89 y/o female admitted with chest pain positive for STEMI and s/p cath/PCI of RCA 12/31/22.  PMH positive for HTN, DM, prior CVA.   Clinical Impression   Pt was seen for OT ADL retraining session with focus on bed mobility, sitting up at EOB/sitting balance, activity tolerance, grooming/UB ADL's. Pt was overall set up to Min guard assist for UB ADL's and she is Mod I bed mobility for supine to sit at EOB HOB elevated with increased time for task and Min assist for sit to supine and reposition/scoot when back in bed. Pt is making progress toward OT goals and should benefit from further treatment at SNF level prior to anticipated d/c back to ALF when medically able.      Recommendations for follow up therapy are one component of a multi-disciplinary discharge planning process, led by the attending physician.  Recommendations may be updated based on patient status, additional functional criteria and insurance authorization.   Follow Up Recommendations  Skilled nursing-short term rehab (<3 hours/day)     Assistance Recommended at Discharge Frequent or constant Supervision/Assistance  Patient can return home with the following A lot of help with walking and/or transfers;A lot of help with bathing/dressing/bathroom;Assistance with cooking/housework;Direct supervision/assist for medications management;Assist for transportation;Direct supervision/assist for financial management;Help with stairs or ramp for entrance    Functional Status Assessment  Patient has had a recent decline in their functional status and demonstrates the ability to make significant improvements in function in a reasonable and predictable amount of time.  Equipment Recommendations  Other (comment) (defer to next venue)    Recommendations for Other Services        Precautions / Restrictions Precautions Precautions: Fall Precaution Comments: non ambulatory at baseline Restrictions Weight Bearing Restrictions: No      Mobility Bed Mobility Overal bed mobility: Needs Assistance Bed Mobility: Supine to Sit, Sit to Supine     Supine to sit: Modified independent (Device/Increase time), HOB elevated Sit to supine: Min guard, Supervision   General bed mobility comments: Pt was Mod I for supine to sit at EOB with HOB elevated. She states that she uses a hospital bed at baseline at ALF. Pt required min guard assist with use of rails when getting back in bed and going from sitting to supine with HOB flat.    Transfers  Pt is chairfast    Balance Overall balance assessment: Needs assistance Sitting-balance support: Feet supported, Feet unsupported, No upper extremity supported, Single extremity supported Sitting balance-Leahy Scale: Good Sitting balance - Comments: Pt sitting at EOB during UB ADL's and grooming without physical assist x15 min noted.       ADL either performed or assessed with clinical judgement   ADL Overall ADL's : Needs assistance/impaired Eating/Feeding: Independent;Sitting   Grooming: Set up;Sitting;Wash/dry hands;Wash/dry face;Oral care   Upper Body Bathing: Sitting;Min guard;Set up   General ADL Comments: Pt was seen for OT ADL retraining session with focus on bed mobility, sitting up at EOB/sitting balance, activity tolerance, grooming/UB ADL's. Pt was overall set up to [place] guard assist for UB ADL's and she is Mod I bed mobility for supine to sit at EOB with increased time for task and Min assist for sit to supine amd reposition when back in bed. Pt is making progress toward OT goals and should benefit from furhter treatment at SNF level prior  to anticipated d/c back to ALF when medically able.     Vision Ability to See in Adequate Light: 0 Adequate Patient Visual Report: No change from baseline Vision  Assessment?: No apparent visual deficits            Pertinent Vitals/Pain Pain Assessment Pain Assessment: No/denies pain Faces Pain Scale: No hurt     Hand Dominance Right   Extremity/Trunk Assessment Upper Extremity Assessment Upper Extremity Assessment: Generalized weakness   Lower Extremity Assessment Lower Extremity Assessment: Defer to PT evaluation   Cervical / Trunk Assessment Cervical / Trunk Assessment: Kyphotic   Communication Communication Communication: HOH   Cognition Arousal/Alertness: Awake/alert Behavior During Therapy: WFL for tasks assessed/performed Overall Cognitive Status: No family/caregiver present to determine baseline cognitive functioning     General Comments  VSS, multiple skin tears and bruises noted            Home Living Family/patient expects to be discharged to:: Assisted living   Home Equipment: Rolling Walker (2 wheels);BSC/3in1;Shower seat;Grab bars - toilet;Grab bars - tub/shower;Wheelchair - manual   Additional Comments: poor historian, lives at [place]      Prior Functioning/Environment Prior Level of Function : Needs assist     Mobility Comments: wheelchiar level, staff assist for lateral scoots to chair ADLs Comments: ADLs at [facility] level, incontinent, wears brief and staff assist wtih hygiene. Rolls to cafeteria for meals        OT Problem List: Decreased strength;Decreased range of motion;Decreased activity tolerance;Impaired balance (sitting and/or standing);Decreased cognition;Decreased knowledge of use of DME or AE;Decreased safety awareness;Decreased knowledge of precautions;Impaired UE functional use      OT Treatment/Interventions: Self-care/ADL training;Therapeutic exercise;DME and/or AE instruction;Therapeutic activities;Patient/family education;Balance training    OT Goals(Current goals can be found in the care plan section) Acute Rehab OT Goals Patient Stated Goal: Go to SNF today OT Goal Formulation:  With patient Time For Goal Achievement: 01/15/23 Potential to Achieve Goals: Good  OT Frequency: Min 2X/week    Co-evaluation              AM-PAC OT "6 Clicks" Daily Activity     Outcome Measure Help from another person eating meals?: None Help from another person taking care of personal grooming?: A Little Help from another person toileting, which includes using toliet, bedpan, or urinal?: A Lot Help from another person bathing (including washing, rinsing, drying)?: A Lot Help from another person to put on and taking off regular upper body clothing?: A Little Help from another person to put on and taking off regular lower body clothing?: A Lot 6 Click Score: 16   End of Session Nurse Communication: Mobility status  Activity Tolerance: Patient tolerated treatment well Patient left: in bed;with call bell/phone within reach  OT Visit Diagnosis: Unsteadiness on feet (R26.81);Other abnormalities of gait and mobility (R26.89);Muscle weakness (generalized) (M62.81)                Time: 4287-6811 OT Time Calculation (min): 22 min Charges:  OT General Charges $OT Visit: 1 Visit OT Treatments $Self Care/Home Management : 8-22 mins  Audwin Semper Beth Dixon, OTR/L 01/03/2023, 11:21 AM

## 2023-01-03 NOTE — Progress Notes (Signed)
Physical Therapy Treatment Patient Details Name: [name] MRN: [id number] DOB: 26-Mar-1929 Today's Date: 01/03/2023   History of Present Illness Patient is a 89 y/o female admitted with chest pain positive for STEMI and s/p cath/PCI of RCA 12/31/22.  PMH positive for HTN, DM, prior CVA.    PT Comments    Pt was seen for progression of mobility and noted her energy level was low, declined OOB today.  Talked with her about bed ex and she agreed to move LE's.  Has issues of knee stiffness and discomfort, but was finally able to be assisted to stretch and get through a reasonable routine for encouraging her to stand eventually.  Pt is not ambulatory at baseline, will recommend her to SNF care for followup from her current living situation in ALF as more dedicated help will be there to increase her chances of being up to move legs in a chair.  Follow acutely for goals of PT.   Recommendations for follow up therapy are one component of a multi-disciplinary discharge planning process, led by the attending physician.  Recommendations may be updated based on patient status, additional functional criteria and insurance authorization.  Follow Up Recommendations  Skilled nursing-short term rehab (<3 hours/day) Can patient physically be transported by private vehicle: No   Assistance Recommended at Discharge Frequent or constant Supervision/Assistance  Patient can return home with the following A lot of help with walking and/or transfers;A little help with bathing/dressing/bathroom;Assist for transportation;Help with stairs or ramp for entrance;Assistance with cooking/housework   Equipment Recommendations  None recommended by PT    Recommendations for Other Services       Precautions / Restrictions Precautions Precautions: Fall Precaution Comments: not walking baseline Restrictions Weight Bearing Restrictions: No     Mobility  Bed Mobility Overal bed mobility: Needs Assistance Bed  Mobility: Rolling Rolling: Min assist              Transfers                   General transfer comment: pt declined    Ambulation/Gait                   Stairs             Wheelchair Mobility    Modified Rankin (Stroke Patients Only)       Balance Overall balance assessment: Needs assistance                                          Cognition Arousal/Alertness: Awake/alert Behavior During Therapy: WFL for tasks assessed/performed Overall Cognitive Status: No family/caregiver present to determine baseline cognitive functioning Area of Impairment: Safety/judgement, Problem solving, Following commands                     Memory: Decreased short-term memory Following Commands: Follows one step commands with increased time Safety/Judgement: Decreased awareness of deficits Awareness: Intellectual Problem Solving: Slow processing, Requires verbal cues General Comments: slow to follow instructions and cues for mobility        Exercises General Exercises - Lower Extremity Ankle Circles/Pumps: AROM, AAROM, 5 reps Quad Sets: AROM, 10 reps Gluteal Sets: AROM, 10 reps Heel Slides: AROM, AAROM, 10 reps Hip ABduction/ADduction: AROM, AAROM, 10 reps Hip Flexion/Marching: AROM, AAROM, 10 reps    General Comments General comments (skin integrity, edema, etc.): pt was  able to be cued for LE strengthening and ROM, but is not recalling instructions and required redirection to finish      Pertinent Vitals/Pain Pain Assessment Pain Assessment: Faces Faces Pain Scale: Hurts a little bit Pain Location: knees Pain Descriptors / Indicators: Guarding Pain Intervention(s): Limited activity within patient's tolerance, Repositioned, Monitored during session    Home Living                          Prior Function            PT Goals (current goals can now be found in the care plan section) Acute Rehab PT  Goals Patient Stated Goal: to ALF Progress towards PT goals: Not progressing toward goals - comment    Frequency    Min 3X/week      PT Plan Current plan remains appropriate    Co-evaluation              AM-PAC PT "6 Clicks" Mobility   Outcome Measure  Help needed turning from your back to your side while in a flat bed without using bedrails?: None Help needed moving from lying on your back to sitting on the side of a flat bed without using bedrails?: None Help needed moving to and from a bed to a chair (including a wheelchair)?: A Lot Help needed standing up from a chair using your arms (e.g., wheelchair or bedside chair)?: Total Help needed to walk in hospital room?: Total Help needed climbing 3-5 steps with a railing? : Total 6 Click Score: 13    End of Session   Activity Tolerance: Patient limited by fatigue Patient left: in bed;with call bell/phone within reach;with bed alarm set Nurse Communication: Mobility status PT Visit Diagnosis: Other abnormalities of gait and mobility (R26.89);Muscle weakness (generalized) (M62.81)     Time: 5993-5701 PT Time Calculation (min) (ACUTE ONLY): 20 min  Charges:  $Therapeutic Exercise: 8-22 mins           Ramond Dial 01/03/2023, 3:55 PM  Mee Hives, PT PhD Acute Rehab Dept. Number: [facility]

## 2023-01-03 NOTE — TOC Progression Note (Signed)
[facility]) - Progression Note    Patient Details  Name: [name] MRN: [id number] Date of Birth: [birth year]  [facility] CM/SW [place], [place] Phone Number: 01/03/2023, 11:21 AM  Clinical Narrative:     Patients insurance authorization currently pending for SNF placement. Patient has SN[facility] bed at [facility] CSW will continue to follow and assist with patients dc planning needs.  Expected Discharge Plan: Assisted Living ([facility]) Barriers to Discharge: Continued Medical Work up  Expected Discharge Plan and Services In-house Referral: Clinical Social Work     Living arrangements for the past 2 months: [place]                                       Social Determinants of Health (SDOH) Interventions SDOH Screenings   Food Insecurity: No Food Insecurity (01/02/2023)  Housing: Low Risk  (01/02/2023)  Transportation Needs: No Transportation Needs (01/02/2023)  Utilities: Not At Risk (01/02/2023)  Physical Activity: Inactive (02/08/2019)  Social Connections: Unknown (02/08/2019)  Stress: No Stress Concern Present (02/08/2019)  Tobacco Use: Low Risk  (01/01/2023)    Readmission Risk Interventions     No data to display

## 2023-01-03 NOTE — Care Management Important Message (Signed)
Important Message  Patient Details  Name: [name] MRN: [id number] Date of Birth: 1929-08-02   Medicare Important Message Given:  Yes     Shelda Altes 01/03/2023, 10:58 AM

## 2023-01-04 ENCOUNTER — Ambulatory Visit: Payer: Medicare Other | Admitting: Urology

## 2023-01-04 DIAGNOSIS — R531 Weakness: Secondary | ICD-10-CM | POA: Diagnosis not present

## 2023-01-04 DIAGNOSIS — E1140 Type 2 diabetes mellitus with diabetic neuropathy, unspecified: Secondary | ICD-10-CM | POA: Diagnosis not present

## 2023-01-04 DIAGNOSIS — E559 Vitamin D deficiency, unspecified: Secondary | ICD-10-CM | POA: Diagnosis not present

## 2023-01-04 DIAGNOSIS — Z7401 Bed confinement status: Secondary | ICD-10-CM | POA: Diagnosis not present

## 2023-01-04 DIAGNOSIS — R6889 Other general symptoms and signs: Secondary | ICD-10-CM | POA: Diagnosis not present

## 2023-01-04 DIAGNOSIS — Z794 Long term (current) use of insulin: Secondary | ICD-10-CM | POA: Diagnosis not present

## 2023-01-04 DIAGNOSIS — R262 Difficulty in walking, not elsewhere classified: Secondary | ICD-10-CM | POA: Diagnosis not present

## 2023-01-04 DIAGNOSIS — R269 Unspecified abnormalities of gait and mobility: Secondary | ICD-10-CM | POA: Diagnosis not present

## 2023-01-04 DIAGNOSIS — E876 Hypokalemia: Secondary | ICD-10-CM | POA: Diagnosis not present

## 2023-01-04 DIAGNOSIS — I25810 Atherosclerosis of coronary artery bypass graft(s) without angina pectoris: Secondary | ICD-10-CM | POA: Insufficient documentation

## 2023-01-04 DIAGNOSIS — I6990 Unspecified sequelae of unspecified cerebrovascular disease: Secondary | ICD-10-CM | POA: Diagnosis not present

## 2023-01-04 DIAGNOSIS — D72829 Elevated white blood cell count, unspecified: Secondary | ICD-10-CM | POA: Insufficient documentation

## 2023-01-04 DIAGNOSIS — I2119 ST elevation (STEMI) myocardial infarction involving other coronary artery of inferior wall: Secondary | ICD-10-CM | POA: Diagnosis not present

## 2023-01-04 DIAGNOSIS — R5381 Other malaise: Secondary | ICD-10-CM | POA: Insufficient documentation

## 2023-01-04 DIAGNOSIS — M179 Osteoarthritis of knee, unspecified: Secondary | ICD-10-CM | POA: Diagnosis not present

## 2023-01-04 DIAGNOSIS — E785 Hyperlipidemia, unspecified: Secondary | ICD-10-CM | POA: Diagnosis not present

## 2023-01-04 DIAGNOSIS — E782 Mixed hyperlipidemia: Secondary | ICD-10-CM | POA: Diagnosis not present

## 2023-01-04 DIAGNOSIS — I2510 Atherosclerotic heart disease of native coronary artery without angina pectoris: Secondary | ICD-10-CM | POA: Insufficient documentation

## 2023-01-04 DIAGNOSIS — J439 Emphysema, unspecified: Secondary | ICD-10-CM | POA: Diagnosis not present

## 2023-01-04 DIAGNOSIS — J9620 Acute and chronic respiratory failure, unspecified whether with hypoxia or hypercapnia: Secondary | ICD-10-CM | POA: Diagnosis not present

## 2023-01-04 DIAGNOSIS — I10 Essential (primary) hypertension: Secondary | ICD-10-CM | POA: Diagnosis not present

## 2023-01-04 DIAGNOSIS — L03317 Cellulitis of buttock: Secondary | ICD-10-CM | POA: Diagnosis not present

## 2023-01-04 DIAGNOSIS — I700 Atherosclerosis of aorta: Secondary | ICD-10-CM | POA: Diagnosis not present

## 2023-01-04 DIAGNOSIS — M6281 Muscle weakness (generalized): Secondary | ICD-10-CM | POA: Diagnosis not present

## 2023-01-04 DIAGNOSIS — Z743 Need for continuous supervision: Secondary | ICD-10-CM | POA: Diagnosis not present

## 2023-01-04 DIAGNOSIS — I639 Cerebral infarction, unspecified: Secondary | ICD-10-CM | POA: Diagnosis not present

## 2023-01-04 DIAGNOSIS — E119 Type 2 diabetes mellitus without complications: Secondary | ICD-10-CM | POA: Diagnosis not present

## 2023-01-04 DIAGNOSIS — I219 Acute myocardial infarction, unspecified: Secondary | ICD-10-CM | POA: Diagnosis not present

## 2023-01-04 DIAGNOSIS — D649 Anemia, unspecified: Secondary | ICD-10-CM | POA: Diagnosis not present

## 2023-01-04 DIAGNOSIS — G9341 Metabolic encephalopathy: Secondary | ICD-10-CM | POA: Diagnosis not present

## 2023-01-04 DIAGNOSIS — I499 Cardiac arrhythmia, unspecified: Secondary | ICD-10-CM | POA: Diagnosis not present

## 2023-01-04 DIAGNOSIS — L8990 Pressure ulcer of unspecified site, unspecified stage: Secondary | ICD-10-CM | POA: Insufficient documentation

## 2023-01-04 DIAGNOSIS — R293 Abnormal posture: Secondary | ICD-10-CM | POA: Diagnosis not present

## 2023-01-04 LAB — HEPATIC FUNCTION PANEL
ALT: 19 U/L (ref 0–44)
AST: 27 U/L (ref 15–41)
Albumin: 2.7 g/dL — ABNORMAL LOW (ref 3.5–5.0)
Alkaline Phosphatase: 65 U/L (ref 38–126)
Bilirubin, Direct: <0.1 mg/dL (ref 0.0–0.2)
Total Bilirubin: 0.7 mg/dL (ref 0.3–1.2)
Total Protein: 5.7 g/dL — ABNORMAL LOW (ref 6.5–8.1)

## 2023-01-04 LAB — GLUCOSE, CAPILLARY
Glucose-Capillary: 83 mg/dL (ref 70–99)
Glucose-Capillary: 96 mg/dL (ref 70–99)
Glucose-Capillary: 109 mg/dL — ABNORMAL HIGH (ref 70–99)

## 2023-01-04 LAB — CBC
HCT: 34.3 % — ABNORMAL LOW (ref 36.0–46.0)
Hemoglobin: 11.2 g/dL — ABNORMAL LOW (ref 12.0–15.0)
MCH: 28.9 pg (ref 26.0–34.0)
MCHC: 32.7 g/dL (ref 30.0–36.0)
MCV: 88.6 fL (ref 80.0–100.0)
Platelets: 223 10*3/uL (ref 150–400)
RBC: 3.87 MIL/uL (ref 3.87–5.11)
RDW: 13.7 % (ref 11.5–15.5)
WBC: 9.8 10*3/uL (ref 4.0–10.5)
nRBC: 0 % (ref 0.0–0.2)

## 2023-01-04 LAB — BASIC METABOLIC PANEL
Anion gap: 9 (ref 5–15)
BUN: 19 mg/dL (ref 8–23)
CO2: 23 mmol/L (ref 22–32)
Calcium: 8.6 mg/dL — ABNORMAL LOW (ref 8.9–10.3)
Chloride: 105 mmol/L (ref 98–111)
Creatinine, Ser: 0.74 mg/dL (ref 0.44–1.00)
GFR, Estimated: >60 mL/min (ref >60)
Glucose, Bld: 92 mg/dL (ref 70–99)
Potassium: 3.3 mmol/L — ABNORMAL LOW (ref 3.5–5.1)
Sodium: 137 mmol/L (ref 135–145)

## 2023-01-04 LAB — MAGNESIUM: Magnesium: 1.6 mg/dL — ABNORMAL LOW (ref 1.7–2.4)

## 2023-01-04 MED ORDER — ORAL CARE MOUTH RINSE: 15.0000 mL | OROMUCOSAL | Status: DC | PRN

## 2023-01-04 MED ORDER — NITROGLYCERIN 0.4 MG SL SUBL: 0.4000 mg | SUBLINGUAL_TABLET | SUBLINGUAL | 3 refills | Status: DC | PRN

## 2023-01-04 MED ORDER — ROSUVASTATIN CALCIUM 20 MG PO TABS: 20.0000 mg | ORAL_TABLET | Freq: Every day | ORAL | Status: DC

## 2023-01-04 MED ORDER — MUPIROCIN 2 % EX OINT: 1.0000 | TOPICAL_OINTMENT | Freq: Two times a day (BID) | CUTANEOUS | Status: AC

## 2023-01-04 MED ORDER — LOSARTAN POTASSIUM 25 MG PO TABS: 25.0000 mg | ORAL_TABLET | Freq: Every day | ORAL | Status: DC

## 2023-01-04 MED ORDER — ASPIRIN 81 MG PO TBEC: 81.0000 mg | DELAYED_RELEASE_TABLET | Freq: Every day | ORAL | Status: DC

## 2023-01-04 MED ORDER — CHLORHEXIDINE GLUCONATE CLOTH 2 % EX PADS: 6.0000 | MEDICATED_PAD | Freq: Every day | CUTANEOUS | Status: AC

## 2023-01-04 MED ORDER — METOPROLOL TARTRATE 25 MG PO TABS: 25.0000 mg | ORAL_TABLET | Freq: Two times a day (BID) | ORAL | Status: DC

## 2023-01-04 MED ORDER — DOXYLAMINE SUCCINATE (SLEEP) 25 MG PO TABS: 25.0000 mg | ORAL_TABLET | Freq: Every evening | ORAL | Status: DC | PRN

## 2023-01-04 MED ORDER — POTASSIUM CHLORIDE CRYS ER 20 MEQ PO TBCR
40.0000 meq | EXTENDED_RELEASE_TABLET | Freq: Once | ORAL | Status: AC
  Administered 2023-01-04: 40 meq via ORAL
  Filled 2023-01-04: qty 2

## 2023-01-04 MED ORDER — MAGNESIUM OXIDE -MG SUPPLEMENT 400 (240 MG) MG PO TABS: 200.0000 mg | ORAL_TABLET | Freq: Every day | ORAL | Status: DC

## 2023-01-04 MED ORDER — TICAGRELOR 90 MG PO TABS: 90.0000 mg | ORAL_TABLET | Freq: Two times a day (BID) | ORAL | Status: DC

## 2023-01-04 MED ORDER — MAGNESIUM SULFATE 4 GM/100ML IV SOLN
4.0000 g | Freq: Once | INTRAVENOUS | Status: AC
  Administered 2023-01-04: 4 g via INTRAVENOUS
  Filled 2023-01-04: qty 100

## 2023-01-04 NOTE — TOC Transition Note (Signed)
Transition of Care [facility]) - CM/SW Discharge Note   Patient Details  Name: [name] MRN: [id number] Date of Birth: 09-Feb-1929  Transition of Care [facility]) CM/SW Contact:  Milas Gain, [place] Phone Number: 01/04/2023, 11:35 AM   Clinical Narrative:     Patient will DC to: [facility]   Anticipated DC date: 01/04/2023  Family notified: Doren Custard  Transport by: Corey Harold  ?  Per MD patient ready for DC to [facility] . RN, patient, patient's family, and facility notified of DC. Discharge Summary sent to facility. RN given number for report tele# [phone] Bed 2. DC packet on chart. Ambulance transport requested for patient.  CSW signing off.    Final next level of care: Skilled Nursing Facility Barriers to Discharge: No Barriers Identified   Patient Goals and CMS Choice CMS Medicare.gov Compare Post Acute Care list provided to:: Patient (patient and patients son) Choice offered to / list presented to : Patient, Adult Children  Discharge Placement                Patient chooses bed at:  [facility]) Patient to be transferred to facility by: [place] Name of family member notified: Doren Custard Patient and family notified of of transfer: 01/04/23  Discharge Plan and Services Additional resources added to the After Visit Summary for   In-house Referral: Clinical Social Work                                   Social Determinants of Health ([place]) Interventions SDOH Screenings   Food Insecurity: No Food Insecurity (01/02/2023)  Housing: Low Risk  (01/02/2023)  Transportation Needs: No Transportation Needs (01/02/2023)  Utilities: Not At Risk (01/02/2023)  Physical Activity: Inactive (02/08/2019)  Social Connections: Unknown (02/08/2019)  Stress: No Stress Concern Present (02/08/2019)  Tobacco Use: Low Risk  (01/01/2023)     Readmission Risk Interventions    01/04/2023   11:03 AM  Readmission Risk Prevention Plan  Transportation Screening Complete   Social Work Consult for [place] Planning/Counseling Complete

## 2023-01-04 NOTE — Progress Notes (Signed)
Report called to [facility].  Patient notified of transfer.  IV Mg infusing.  Patient to be taken by PTAR at 1500hrs.

## 2023-01-04 NOTE — TOC Progression Note (Signed)
Transition of Care [facility]) - Progression Note    Patient Details  Name: [name] MRN: [id number] Date of Birth: 1929-12-09  Transition of Care [facility]) CM/SW [place], [place] Phone Number: 01/04/2023, 10:55 AM  Clinical Narrative:     CSW received insurance authorization approval for patient. Plan Auth ID# [id number] . [place] ID# [id number].  CSW spoke with Jackelyn Poling with [facility] who confirmed patient can DC over today if medically ready. CSW will continue to follow and assist with patients dc planning needs.   Expected Discharge Plan: Assisted Living ([facility]) Barriers to Discharge: Continued Medical Work up  Expected Discharge Plan and Services In-house Referral: Clinical Social Work     Living arrangements for the past 2 months: [place]                                       Social Determinants of Health (SDOH) Interventions SDOH Screenings   Food Insecurity: No Food Insecurity (01/02/2023)  Housing: Low Risk  (01/02/2023)  Transportation Needs: No Transportation Needs (01/02/2023)  Utilities: Not At Risk (01/02/2023)  Physical Activity: Inactive (02/08/2019)  Social Connections: Unknown (02/08/2019)  Stress: No Stress Concern Present (02/08/2019)  Tobacco Use: Low Risk  (01/01/2023)    Readmission Risk Interventions     No data to display

## 2023-01-04 NOTE — Progress Notes (Addendum)
Progress Note  Patient Name: [name] Date of Encounter: 01/04/2023  Primary Cardiologist: Burt Knack saw as inpatient but will need eventual [facility] MD  Subjective   Feeling well without complaint, no CP or SOB.  Inpatient Medications    Scheduled Meds:  aspirin  81 mg Oral Daily   Chlorhexidine Gluconate Cloth  6 each Topical Daily   Chlorhexidine Gluconate Cloth  6 each Topical Q0600   heparin  5,000 Units Subcutaneous Q8H   insulin aspart  0-15 Units Subcutaneous TID WC   insulin glargine-yfgn  20 Units Subcutaneous QHS   losartan  25 mg Oral Daily   metFORMIN  500 mg Oral Q breakfast   metoprolol tartrate  25 mg Oral BID   mupirocin ointment  1 Application Nasal BID   potassium chloride  40 mEq Oral Once   rosuvastatin  20 mg Oral Daily   sertraline  75 mg Oral Daily   sodium chloride flush  3 mL Intravenous Q12H   ticagrelor  90 mg Oral BID   Continuous Infusions:  sodium chloride     PRN Meds: sodium chloride, acetaminophen, nitroGLYCERIN, ondansetron (ZOFRAN) IV, sodium chloride flush, zolpidem   Vital Signs    Vitals:   01/03/23 1952 01/03/23 2237 01/04/23 0351 01/04/23 0731  BP: (!) 143/63 (!) 133/115 (!) 141/64 (!) 151/59  Pulse: 60 64 60 60  Resp: '20  19 20  '$ Temp: 98.4 F (36.9 C)  98 F (36.7 C) 98.1 F (36.7 C)  TempSrc: Oral  Oral Oral  SpO2: 99%  98% 98%  Weight:        Intake/Output Summary (Last 24 hours) at 01/04/2023 0859 Last data filed at 01/03/2023 1010 Gross per 24 hour  Intake --  Output 500 ml  Net -500 ml      01/03/2023    3:33 AM 10/10/2022    4:08 PM 09/06/2022    9:46 AM  Last 3 Weights  Weight (lbs) 171 lb 1.2 oz 175 lb 6.4 oz 173 lb 6.4 oz  Weight (kg) 77.6 kg 79.561 kg 78.654 kg     Telemetry    NSR, occ PVCs - Personally Reviewed  ECG    No new tracings but reviewed last one with NSR with known RBBB - Personally Reviewed  Physical Exam   GEN: No acute distress.  HEENT: Normocephalic,  atraumatic, sclera non-icteric. Purple hair. Neck: No JVD or bruits. Cardiac: RRR no murmurs, rubs, or gallops.  Respiratory: Clear to auscultation bilaterally. Breathing is unlabored. GI: Soft, nontender, non-distended, BS +x 4. MS: no deformity. Extremities: No clubbing or cyanosis. No edema. Distal pedal pulses are 2+ and equal bilaterally. Right groin cath site with mild ecchymosis in area of cath site, no hematoma, no bruit. Neuro:  AAOx3. Follows commands. Psych:  Responds to questions appropriately with a normal affect.  Labs    High Sensitivity Troponin:   Recent Labs  Lab 12/31/22 2020  TROPONINIHS >24,000*      Cardiac EnzymesNo results for input(s): "TROPONINI" in the last 168 hours. No results for input(s): "TROPIPOC" in the last 168 hours.   Chemistry Recent Labs  Lab 12/31/22 2020 01/01/23 0233 01/04/23 0251  NA 134* 134* 137  K 3.6 3.6 3.3*  CL 102 103 105  CO2 22 20* 23  GLUCOSE 222* 253* 92  BUN '16 18 19  '$ CREATININE 0.74 0.83 0.74  CALCIUM 9.1 9.2 8.6*  PROT 6.0*  --   --   ALBUMIN 3.0*  --   --  AST 89*  --   --   ALT 24  --   --   ALKPHOS 76  --   --   BILITOT 0.8  --   --   GFRNONAA >60 >60 >60  ANIONGAP '10 11 9     '$ Hematology Recent Labs  Lab 12/31/22 2020 01/01/23 0233 01/04/23 0251  WBC 11.0* 12.5* 9.8  RBC 4.21 4.01 3.87  HGB 12.2 11.6* 11.2*  HCT 36.8 34.8* 34.3*  MCV 87.4 86.8 88.6  MCH 29.0 28.9 28.9  MCHC 33.2 33.3 32.7  RDW 13.2 13.2 13.7  PLT 233 224 223    BNP Recent Labs  Lab 12/31/22 2020  BNP 510.0*     DDimer No results for input(s): "DDIMER" in the last 168 hours.   Radiology    No results found.  Cardiac Studies   Cath: 12/31/2022   Conclusions: Significant multivessel coronary artery disease, as detailed below.  Culprit for the inferior STEMI are sequential 90% proximal/mid and 100% distal RCA lesions.  There is also a 70-80% distal LCx stenosis as well as 50-60% lesions involving OM1 and both  diagonal branches. Challenging PCI to RCA with inability to pass anything other than a Tekeru 1.5 mm balloon to the distal RCA.  This allowed flow to be area established with improvement in distal RCA disease from 100% to 90% with TIMI-3 flow. Successful PCI to proximal/mid RCA stenosis using Synergy 2.5 x 20 mm drug-eluting stent with reduction in stenosis from 90% to less than 20% and reestablishment of TIMI-3 flow.   Recommendations: Dual antiplatelet therapy with aspirin and ticagrelor for at least 12 months. Aggressive secondary prevention of coronary artery disease. Obtain echocardiogram for evaluation of left ventricular systolic function.   [name], MD [facility]   Diagnostic Dominance: Right  Intervention      Echo: 01/01/2023   IMPRESSIONS     1. Left ventricular ejection fraction, by estimation, is 60 to 65%. The  left ventricle has normal function. The left ventricle has no regional  wall motion abnormalities. There is mild concentric left ventricular  hypertrophy. Left ventricular diastolic  parameters are consistent with Grade I diastolic dysfunction (impaired  relaxation).   2. Right ventricular systolic function is normal. The right ventricular  size is normal. Tricuspid regurgitation signal is inadequate for assessing  PA pressure.   3. The mitral valve is degenerative. Trivial mitral valve regurgitation.  No evidence of mitral stenosis.   4. The aortic valve is tricuspid. There is mild calcification of the  aortic valve. There is mild thickening of the aortic valve. Aortic valve  regurgitation is not visualized. Aortic valve sclerosis is present, with  no evidence of aortic valve stenosis.   5. The inferior vena cava is normal in size with greater than 50%  respiratory variability, suggesting right atrial pressure of 3 mmHg.   FINDINGS   Left Ventricle: Left ventricular ejection fraction, by estimation, is 60  to 65%. The left ventricle has  normal function. The left ventricle has no  regional wall motion abnormalities. Definity contrast agent was given IV  to delineate the left ventricular   endocardial borders. The left ventricular internal cavity size was normal  in size. There is mild concentric left ventricular hypertrophy. Left  ventricular diastolic parameters are consistent with Grade I diastolic  dysfunction (impaired relaxation).   Right Ventricle: The right ventricular size is normal. No increase in  right ventricular wall thickness. Right ventricular systolic function is  normal. Tricuspid  regurgitation signal is inadequate for assessing PA  pressure.   Left Atrium: Left atrial size was normal in size.   Right Atrium: Right atrial size was normal in size.   Pericardium: There is no evidence of pericardial effusion.   Mitral Valve: The mitral valve is degenerative in appearance. Mild to  moderate mitral annular calcification. Trivial mitral valve regurgitation.  No evidence of mitral valve stenosis.   Tricuspid Valve: The tricuspid valve is grossly normal. Tricuspid valve  regurgitation is trivial. No evidence of tricuspid stenosis.   Aortic Valve: The aortic valve is tricuspid. There is mild calcification  of the aortic valve. There is mild thickening of the aortic valve. Aortic  valve regurgitation is not visualized. Aortic valve sclerosis is present,  with no evidence of aortic valve  stenosis.   Pulmonic Valve: The pulmonic valve was grossly normal. Pulmonic valve  regurgitation is not visualized. No evidence of pulmonic stenosis.   Aorta: The aortic root and ascending aorta are structurally normal, with  no evidence of dilitation.   Venous: The inferior vena cava is normal in size with greater than 50%  respiratory variability, suggesting right atrial pressure of 3 mmHg.   IAS/Shunts: The atrial septum is grossly normal.     Patient Profile     89 y.o. female with DM with neuropathy, HTN,  prior stroke, RBBB, dementia per chart from ALF who presented with late presenting inferior MI, tx with PCI, with EF 60-65%.  Assessment & Plan    1. Late presenting inferior STEMI, CAD - cath 12/31/22 with significant MVCAD: Culprit for the inferior STEMI are sequential 90% proximal/mid and 100% distal RCA lesions. There is also a 70-80% distal LCx stenosis as well as 50-60% lesions involving OM1 and both diagonal branches. Challenging PCI to RCA with inability to pass anything other than a Tekeru 1.5 mm balloon to the distal RCA. This allowed flow to be area established with improvement in distal RCA disease from 100% to 90% with TIMI-3 flow. Successful PCI to proximal/mid RCA stenosis using Synergy 2.5 x 20 mm drug-eluting stent with reduction in stenosis from 90% to less than 20% and reestablishment of TIMI-3 flow.  - EF 60-65%, G1DD, mild LVH, trivial MR - now on ASA, Brilinta, metoprolol, and adjusted statin  2. Diabetes mellitus - Hemoglobin A1c 7.2 - metformin restarted since >48 hours from cath, also on home long acting insulin 20 units - SSI here  3. Essential HTN - SBP variable from 111/95 to 151/59 over the last 24 hours, will review plan with MD  4. Hyperlipidemia - LDL 71, HDL 50 - simvastatin changed to rosuvastatin - LFTs on admission with AST 89, normal ALT - will repeat - if the patient is tolerating statin at time of follow-up appointment, would consider rechecking liver function/lipid panel in 6-8 weeks.  5. Hypokalemia, also hx of hypomagnesemia - K 3.3 -> getting 10mq KCl - will discuss home repletion with MD - Mg pending - calcium 8.6 but corrects to normal for albumin  6. Normocytic anemia - appears similar to prior values, recommend follow up with PCP as OP  7. Debilitation - pending DC to SNF  8. Psych - on sertraline as OP, pharmD verified she is on '75mg'$  daily total in form of 25 + '50mg'$  daily, continued here  9. Leukocytosis - resolved, likely stress  demargination  10. Pressure injury of skin, stage 1 on buttocks - would suggest routine wound care at SNF  F/u scheduled in 1  week, TOC (ph call not initiated as she does not meet criteria with SNF).  For questions or updates, please contact [place] Please consult [email or web address] for contact info under Cardiology/STEMI.  Signed, Charlie Pitter, PA-C 01/04/2023, 8:59 AM    Patient seen, examined. Available data reviewed. Agree with findings, assessment, and plan as outlined by Melina Copa, PA-C.  The patient is independently interviewed and examined.  No new medical issues.  She has been waiting for insurance approval for SNF placement.  Alert, oriented, elderly woman in no distress.  Lungs clear bilaterally, heart regular rate and rhythm no murmur gallop, abdomen soft nontender, extremities without edema, skin is warm and dry with no rash.  I agree with the findings and plan as outlined above.  I have reviewed her medications and they are appropriate for post MI medical therapy.  No new issues.  Medically stable for discharge today.  Sherren Mocha, M.D. 01/04/2023 9:58 AM

## 2023-01-05 DIAGNOSIS — E782 Mixed hyperlipidemia: Secondary | ICD-10-CM | POA: Diagnosis not present

## 2023-01-05 DIAGNOSIS — E119 Type 2 diabetes mellitus without complications: Secondary | ICD-10-CM | POA: Diagnosis not present

## 2023-01-05 DIAGNOSIS — E559 Vitamin D deficiency, unspecified: Secondary | ICD-10-CM | POA: Diagnosis not present

## 2023-01-05 DIAGNOSIS — M6281 Muscle weakness (generalized): Secondary | ICD-10-CM | POA: Diagnosis not present

## 2023-01-05 DIAGNOSIS — I2510 Atherosclerotic heart disease of native coronary artery without angina pectoris: Secondary | ICD-10-CM | POA: Diagnosis not present

## 2023-01-05 DIAGNOSIS — I219 Acute myocardial infarction, unspecified: Secondary | ICD-10-CM | POA: Diagnosis not present

## 2023-01-08 DIAGNOSIS — E119 Type 2 diabetes mellitus without complications: Secondary | ICD-10-CM | POA: Diagnosis not present

## 2023-01-08 DIAGNOSIS — D649 Anemia, unspecified: Secondary | ICD-10-CM | POA: Diagnosis not present

## 2023-01-08 DIAGNOSIS — I2119 ST elevation (STEMI) myocardial infarction involving other coronary artery of inferior wall: Secondary | ICD-10-CM | POA: Diagnosis not present

## 2023-01-08 DIAGNOSIS — I2510 Atherosclerotic heart disease of native coronary artery without angina pectoris: Secondary | ICD-10-CM | POA: Diagnosis not present

## 2023-01-08 DIAGNOSIS — I10 Essential (primary) hypertension: Secondary | ICD-10-CM | POA: Diagnosis not present

## 2023-01-08 DIAGNOSIS — E876 Hypokalemia: Secondary | ICD-10-CM | POA: Diagnosis not present

## 2023-01-08 DIAGNOSIS — Z794 Long term (current) use of insulin: Secondary | ICD-10-CM | POA: Diagnosis not present

## 2023-01-08 DIAGNOSIS — E782 Mixed hyperlipidemia: Secondary | ICD-10-CM | POA: Diagnosis not present

## 2023-01-11 ENCOUNTER — Ambulatory Visit: Payer: Medicare Other | Admitting: Medical

## 2023-01-11 NOTE — Progress Notes (Deleted)
Cardiology Office Note:    Date:  01/11/2023   ID:  [name], DOB [birth year], MRN [id number]  PCP:  Sharilyn Sites, MD  [facility] Cardiologist:  None  [facility] Electrophysiologist:  None   Referring MD: Sharilyn Sites, MD   Chief Complaint: hospital follow-up   History of Present Illness:    [name] is a 89 y.o. female with a hx of DM with neuropathy, HTN, prior stroke, RBBB, dementia who presents for hospital follow-up.   The patient was recently admitted for late presenting STEMI.  Cath in 2024 showed vessel CAD.  Culprit for the inferior STEMI sequential 90% proximal to mid and 100% distal RCA lesions.  There was also 7080% distal left circumflex stenosis as well as 50 to 60% lesion involving OM 1 and both diagonal branches.  Patient underwent challenging PCI to the RCA with inability to pass anything other than a [place] 1.38m balloon to the distal RCA.  This allowed flow to establish with improvement.  Successful PCI to proximal to mid RCA stenosis with DES was performed.  Echo showed EF 60 to [id number] grade 1 diastolic dysfunction, mild LVH, trivial MR.  Patient was placed on aspirin Brilinta metoprolol and statin was adjusted.    Past Medical History:  Diagnosis Date   Diabetes mellitus with neuropathy    Diabetic [place] ([place]    Hypertension    Stroke ([facility]    Urinary incontinence     Past Surgical History:  Procedure Laterality Date   ABDOMINAL HYSTERECTOMY     APPENDECTOMY     BREAST SURGERY     begnin tumor removed   CATARACT EXTRACTION, BILATERAL     CORONARY ANGIOGRAPHY N/A 12/31/2022   Procedure: CORONARY ANGIOGRAPHY;  Surgeon: ENelva Bush MD;  [facility];  Service: Cardiovascular;  Laterality: N/A;   CORONARY/GRAFT ACUTE MI REVASCULARIZATION N/A 12/31/2022   Procedure: Coronary/Graft Acute MI Revascularization;  Surgeon: ENelva Bush MD;  [facility];  Service: Cardiovascular;  Laterality:  N/A;    Current Medications: No outpatient medications have been marked as taking for the 01/11/23 encounter (Appointment) with FKathlen Mody Katalaya Beel H, PA-C.     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  Tobacco Use   Smoking status: Never   Smokeless tobacco: Never  Vaping Use   Vaping Use: Never used  Substance and Sexual Activity   Alcohol use: No   Drug use: No   Sexual activity: Not Currently    Birth control/protection: None  Other Topics Concern   Not on file  Social History Narrative   Not on file   Social Determinants of Health   Financial Resource Strain: Not on file  Food Insecurity: No Food Insecurity (01/02/2023)   Hunger Vital Sign    Worried About Running Out of Food in the Last Year: Never true    Ran Out of Food in the Last Year: Never true  Transportation Needs: No Transportation Needs (01/02/2023)   PRAPARE - THydrologist(Medical): No    Lack of Transportation (Non-Medical): No  Physical Activity: Inactive (02/08/2019)   Exercise Vital Sign  Days of Exercise per Week: 0 days    Minutes of Exercise per Session: 0 min  Stress: No Stress Concern Present (02/08/2019)   [place]    Feeling of Stress : Not at all  Social Connections: Unknown (02/08/2019)   Social Connection and Isolation Panel [NHANES]    Frequency of Communication with Friends and Family: More than three times a week    Frequency of Social Gatherings with Friends and Family: More than three times a week    Attends Religious Services: More than 4 times per year    Active Member of [employer] or Organizations: Not on  file    Attends Archivist Meetings: Not on file    Marital Status: Married     Family History: The patient's family history includes CAD in her mother; Cancer in her brother; Heart attack in her brother; Hypertension in her mother; Stroke in her father.  ROS:   Please see the history of present illness.     All other systems reviewed and are negative.  EKGs/Labs/Other Studies Reviewed:    The following studies were reviewed today:  Cath: 12/31/2022   Conclusions: Significant multivessel coronary artery disease, as detailed below.  Culprit for the inferior STEMI are sequential 90% proximal/mid and 100% distal RCA lesions.  There is also a 70-80% distal LCx stenosis as well as 50-60% lesions involving OM1 and both diagonal branches. Challenging PCI to RCA with inability to pass anything other than a Tekeru 1.5 mm balloon to the distal RCA.  This allowed flow to be area established with improvement in distal RCA disease from 100% to 90% with TIMI-3 flow. Successful PCI to proximal/mid RCA stenosis using Synergy 2.5 x 20 mm drug-eluting stent with reduction in stenosis from 90% to less than 20% and reestablishment of TIMI-3 flow.   Recommendations: Dual antiplatelet therapy with aspirin and ticagrelor for at least 12 months. Aggressive secondary prevention of coronary artery disease. Obtain echocardiogram for evaluation of left ventricular systolic function.   [name], MD [facility]   Echo: 01/01/2023   IMPRESSIONS    1. Left ventricular ejection fraction, by estimation, is 60 to 65%. The  left ventricle has normal function. The left ventricle has no regional  wall motion abnormalities. There is mild concentric left ventricular  hypertrophy. Left ventricular diastolic  parameters are consistent with Grade I diastolic dysfunction (impaired  relaxation).   2. Right ventricular systolic function is normal. The right ventricular  size is normal. Tricuspid  regurgitation signal is inadequate for assessing  PA pressure.   3. The mitral valve is degenerative. Trivial mitral valve regurgitation.  No evidence of mitral stenosis.   4. The aortic valve is tricuspid. There is mild calcification of the  aortic valve. There is mild thickening of the aortic valve. Aortic valve  regurgitation is not visualized. Aortic valve sclerosis is present, with  no evidence of aortic valve stenosis.   5. The inferior vena cava is normal in size with greater than 50%  respiratory variability, suggesting right atrial pressure of 3 mmHg.   EKG:  EKG is *** ordered today.  The ekg ordered today demonstrates ***  Recent Labs: 12/31/2022: B Natriuretic Peptide 510.0; TSH 2.673 01/04/2023: ALT 19; BUN 19; Creatinine, Ser 0.74; Hemoglobin 11.2; Magnesium 1.6; Platelets 223; Potassium 3.3; Sodium 137  Recent Lipid Panel    Component Value Date/Time   CHOL 135 01/01/2023 0233   CHOL 120 11/13/2017 1037  TRIG 70 01/01/2023 0233   HDL 50 01/01/2023 0233   HDL 45 11/13/2017 1037   CHOLHDL 2.7 01/01/2023 0233   VLDL 14 01/01/2023 0233   LDLCALC 71 01/01/2023 0233   LDLCALC 48 11/13/2017 1037     Risk Assessment/Calculations:   {Does this patient have ATRIAL FIBRILLATION?[phone]}   Physical Exam:    VS:  There were no vitals taken for this visit.    Wt Readings from Last 3 Encounters:  01/03/23 171 lb 1.2 oz (77.6 kg)  10/10/22 175 lb 6.4 oz (79.6 kg)  09/06/22 173 lb 6.4 oz (78.7 kg)     GEN: *** Well nourished, well developed in no acute distress HEENT: Normal NECK: No JVD; No carotid bruits LYMPHATICS: No lymphadenopathy CARDIAC: ***RRR, no murmurs, rubs, gallops RESPIRATORY:  Clear to auscultation without rales, wheezing or rhonchi  ABDOMEN: Soft, non-tender, non-distended MUSCULOSKELETAL:  No edema; No deformity  SKIN: Warm and dry NEUROLOGIC:  Alert and oriented x 3 PSYCHIATRIC:  Normal affect   ASSESSMENT:    No diagnosis found. PLAN:     In order of problems listed above:  ***  Disposition: Follow up {follow up:15908} with ***   Shared Decision Making/Informed Consent   {Are you ordering a CV Procedure (e.g. stress test, cath, DCCV, TEE, etc)?   Press F2        :[id number]    Signed, Keron Koffman Ninfa Meeker, PA-C  01/11/2023 11:02 AM    [place]

## 2023-01-12 DIAGNOSIS — M6281 Muscle weakness (generalized): Secondary | ICD-10-CM | POA: Diagnosis not present

## 2023-01-12 DIAGNOSIS — I639 Cerebral infarction, unspecified: Secondary | ICD-10-CM | POA: Diagnosis not present

## 2023-01-12 DIAGNOSIS — E119 Type 2 diabetes mellitus without complications: Secondary | ICD-10-CM | POA: Diagnosis not present

## 2023-01-12 DIAGNOSIS — E559 Vitamin D deficiency, unspecified: Secondary | ICD-10-CM | POA: Diagnosis not present

## 2023-01-12 DIAGNOSIS — I219 Acute myocardial infarction, unspecified: Secondary | ICD-10-CM | POA: Diagnosis not present

## 2023-01-12 DIAGNOSIS — I2510 Atherosclerotic heart disease of native coronary artery without angina pectoris: Secondary | ICD-10-CM | POA: Diagnosis not present

## 2023-01-12 DIAGNOSIS — J9620 Acute and chronic respiratory failure, unspecified whether with hypoxia or hypercapnia: Secondary | ICD-10-CM | POA: Diagnosis not present

## 2023-01-12 DIAGNOSIS — E782 Mixed hyperlipidemia: Secondary | ICD-10-CM | POA: Diagnosis not present

## 2023-01-18 DIAGNOSIS — E119 Type 2 diabetes mellitus without complications: Secondary | ICD-10-CM | POA: Diagnosis not present

## 2023-01-18 DIAGNOSIS — M6281 Muscle weakness (generalized): Secondary | ICD-10-CM | POA: Diagnosis not present

## 2023-01-18 DIAGNOSIS — E782 Mixed hyperlipidemia: Secondary | ICD-10-CM | POA: Diagnosis not present

## 2023-01-18 DIAGNOSIS — E559 Vitamin D deficiency, unspecified: Secondary | ICD-10-CM | POA: Diagnosis not present

## 2023-01-18 DIAGNOSIS — I2510 Atherosclerotic heart disease of native coronary artery without angina pectoris: Secondary | ICD-10-CM | POA: Diagnosis not present

## 2023-01-18 DIAGNOSIS — I219 Acute myocardial infarction, unspecified: Secondary | ICD-10-CM | POA: Diagnosis not present

## 2023-01-21 DIAGNOSIS — M6281 Muscle weakness (generalized): Secondary | ICD-10-CM | POA: Diagnosis not present

## 2023-01-21 DIAGNOSIS — E559 Vitamin D deficiency, unspecified: Secondary | ICD-10-CM | POA: Diagnosis not present

## 2023-01-21 DIAGNOSIS — E119 Type 2 diabetes mellitus without complications: Secondary | ICD-10-CM | POA: Diagnosis not present

## 2023-01-21 DIAGNOSIS — E782 Mixed hyperlipidemia: Secondary | ICD-10-CM | POA: Diagnosis not present

## 2023-01-21 DIAGNOSIS — I219 Acute myocardial infarction, unspecified: Secondary | ICD-10-CM | POA: Diagnosis not present

## 2023-01-21 DIAGNOSIS — I2510 Atherosclerotic heart disease of native coronary artery without angina pectoris: Secondary | ICD-10-CM | POA: Diagnosis not present

## 2023-01-28 DIAGNOSIS — E1140 Type 2 diabetes mellitus with diabetic neuropathy, unspecified: Secondary | ICD-10-CM | POA: Diagnosis not present

## 2023-01-28 DIAGNOSIS — D649 Anemia, unspecified: Secondary | ICD-10-CM | POA: Diagnosis not present

## 2023-01-28 DIAGNOSIS — I10 Essential (primary) hypertension: Secondary | ICD-10-CM | POA: Diagnosis not present

## 2023-01-28 DIAGNOSIS — I2119 ST elevation (STEMI) myocardial infarction involving other coronary artery of inferior wall: Secondary | ICD-10-CM | POA: Diagnosis not present

## 2023-01-28 DIAGNOSIS — I2510 Atherosclerotic heart disease of native coronary artery without angina pectoris: Secondary | ICD-10-CM | POA: Diagnosis not present

## 2023-01-30 DIAGNOSIS — I2510 Atherosclerotic heart disease of native coronary artery without angina pectoris: Secondary | ICD-10-CM | POA: Diagnosis not present

## 2023-01-30 DIAGNOSIS — E1140 Type 2 diabetes mellitus with diabetic neuropathy, unspecified: Secondary | ICD-10-CM | POA: Diagnosis not present

## 2023-01-30 DIAGNOSIS — D649 Anemia, unspecified: Secondary | ICD-10-CM | POA: Diagnosis not present

## 2023-01-30 DIAGNOSIS — I2119 ST elevation (STEMI) myocardial infarction involving other coronary artery of inferior wall: Secondary | ICD-10-CM | POA: Diagnosis not present

## 2023-01-30 DIAGNOSIS — I10 Essential (primary) hypertension: Secondary | ICD-10-CM | POA: Diagnosis not present

## 2023-02-04 DIAGNOSIS — M6281 Muscle weakness (generalized): Secondary | ICD-10-CM | POA: Diagnosis not present

## 2023-02-04 DIAGNOSIS — I2510 Atherosclerotic heart disease of native coronary artery without angina pectoris: Secondary | ICD-10-CM | POA: Diagnosis not present

## 2023-02-04 DIAGNOSIS — I10 Essential (primary) hypertension: Secondary | ICD-10-CM | POA: Diagnosis not present

## 2023-02-04 DIAGNOSIS — E1140 Type 2 diabetes mellitus with diabetic neuropathy, unspecified: Secondary | ICD-10-CM | POA: Diagnosis not present

## 2023-02-04 DIAGNOSIS — D649 Anemia, unspecified: Secondary | ICD-10-CM | POA: Diagnosis not present

## 2023-02-04 DIAGNOSIS — I2119 ST elevation (STEMI) myocardial infarction involving other coronary artery of inferior wall: Secondary | ICD-10-CM | POA: Diagnosis not present

## 2023-02-04 DIAGNOSIS — G9341 Metabolic encephalopathy: Secondary | ICD-10-CM | POA: Diagnosis not present

## 2023-02-04 DIAGNOSIS — L03317 Cellulitis of buttock: Secondary | ICD-10-CM | POA: Diagnosis not present

## 2023-02-05 DIAGNOSIS — E1140 Type 2 diabetes mellitus with diabetic neuropathy, unspecified: Secondary | ICD-10-CM | POA: Diagnosis not present

## 2023-02-05 DIAGNOSIS — I2119 ST elevation (STEMI) myocardial infarction involving other coronary artery of inferior wall: Secondary | ICD-10-CM | POA: Diagnosis not present

## 2023-02-05 DIAGNOSIS — I2510 Atherosclerotic heart disease of native coronary artery without angina pectoris: Secondary | ICD-10-CM | POA: Diagnosis not present

## 2023-02-05 DIAGNOSIS — I10 Essential (primary) hypertension: Secondary | ICD-10-CM | POA: Diagnosis not present

## 2023-02-05 DIAGNOSIS — D649 Anemia, unspecified: Secondary | ICD-10-CM | POA: Diagnosis not present

## 2023-02-08 DIAGNOSIS — E1140 Type 2 diabetes mellitus with diabetic neuropathy, unspecified: Secondary | ICD-10-CM | POA: Diagnosis not present

## 2023-02-08 DIAGNOSIS — D649 Anemia, unspecified: Secondary | ICD-10-CM | POA: Diagnosis not present

## 2023-02-08 DIAGNOSIS — I10 Essential (primary) hypertension: Secondary | ICD-10-CM | POA: Diagnosis not present

## 2023-02-08 DIAGNOSIS — I2119 ST elevation (STEMI) myocardial infarction involving other coronary artery of inferior wall: Secondary | ICD-10-CM | POA: Diagnosis not present

## 2023-02-08 DIAGNOSIS — I2510 Atherosclerotic heart disease of native coronary artery without angina pectoris: Secondary | ICD-10-CM | POA: Diagnosis not present

## 2023-02-10 ENCOUNTER — Encounter (HOSPITAL_COMMUNITY): Payer: Self-pay | Admitting: Family Medicine

## 2023-02-10 ENCOUNTER — Other Ambulatory Visit: Payer: Self-pay

## 2023-02-10 ENCOUNTER — Emergency Department (HOSPITAL_COMMUNITY): Payer: Medicare Other

## 2023-02-10 ENCOUNTER — Inpatient Hospital Stay (HOSPITAL_COMMUNITY)
Admission: EM | Admit: 2023-02-10 | Discharge: 2023-02-13 | DRG: 637 | Disposition: A | Discharge status: Home Health | Payer: Medicare Other | Attending: Internal Medicine | Admitting: Internal Medicine

## 2023-02-10 DIAGNOSIS — N3281 Overactive bladder: Secondary | ICD-10-CM | POA: Diagnosis present

## 2023-02-10 DIAGNOSIS — Z809 Family history of malignant neoplasm, unspecified: Secondary | ICD-10-CM

## 2023-02-10 DIAGNOSIS — E785 Hyperlipidemia, unspecified: Secondary | ICD-10-CM | POA: Diagnosis present

## 2023-02-10 DIAGNOSIS — I499 Cardiac arrhythmia, unspecified: Secondary | ICD-10-CM | POA: Diagnosis not present

## 2023-02-10 DIAGNOSIS — Z8249 Family history of ischemic heart disease and other diseases of the circulatory system: Secondary | ICD-10-CM

## 2023-02-10 DIAGNOSIS — G9341 Metabolic encephalopathy: Secondary | ICD-10-CM | POA: Diagnosis not present

## 2023-02-10 DIAGNOSIS — Z794 Long term (current) use of insulin: Secondary | ICD-10-CM

## 2023-02-10 DIAGNOSIS — Z882 Allergy status to sulfonamides status: Secondary | ICD-10-CM

## 2023-02-10 DIAGNOSIS — E1140 Type 2 diabetes mellitus with diabetic neuropathy, unspecified: Secondary | ICD-10-CM | POA: Diagnosis present

## 2023-02-10 DIAGNOSIS — R079 Chest pain, unspecified: Secondary | ICD-10-CM | POA: Diagnosis not present

## 2023-02-10 DIAGNOSIS — Z8673 Personal history of transient ischemic attack (TIA), and cerebral infarction without residual deficits: Secondary | ICD-10-CM | POA: Diagnosis not present

## 2023-02-10 DIAGNOSIS — E162 Hypoglycemia, unspecified: Secondary | ICD-10-CM | POA: Diagnosis not present

## 2023-02-10 DIAGNOSIS — F32A Depression, unspecified: Secondary | ICD-10-CM | POA: Diagnosis not present

## 2023-02-10 DIAGNOSIS — L89322 Pressure ulcer of left buttock, stage 2: Secondary | ICD-10-CM | POA: Diagnosis not present

## 2023-02-10 DIAGNOSIS — E876 Hypokalemia: Secondary | ICD-10-CM | POA: Diagnosis present

## 2023-02-10 DIAGNOSIS — Z7984 Long term (current) use of oral hypoglycemic drugs: Secondary | ICD-10-CM

## 2023-02-10 DIAGNOSIS — L89312 Pressure ulcer of right buttock, stage 2: Secondary | ICD-10-CM | POA: Diagnosis present

## 2023-02-10 DIAGNOSIS — Z79899 Other long term (current) drug therapy: Secondary | ICD-10-CM | POA: Diagnosis not present

## 2023-02-10 DIAGNOSIS — D72829 Elevated white blood cell count, unspecified: Secondary | ICD-10-CM | POA: Diagnosis not present

## 2023-02-10 DIAGNOSIS — E1165 Type 2 diabetes mellitus with hyperglycemia: Secondary | ICD-10-CM | POA: Diagnosis not present

## 2023-02-10 DIAGNOSIS — Z885 Allergy status to narcotic agent status: Secondary | ICD-10-CM

## 2023-02-10 DIAGNOSIS — Z888 Allergy status to other drugs, medicaments and biological substances status: Secondary | ICD-10-CM

## 2023-02-10 DIAGNOSIS — B964 Proteus (mirabilis) (morganii) as the cause of diseases classified elsewhere: Secondary | ICD-10-CM | POA: Diagnosis not present

## 2023-02-10 DIAGNOSIS — Z823 Family history of stroke: Secondary | ICD-10-CM

## 2023-02-10 DIAGNOSIS — Z7982 Long term (current) use of aspirin: Secondary | ICD-10-CM | POA: Diagnosis not present

## 2023-02-10 DIAGNOSIS — I2510 Atherosclerotic heart disease of native coronary artery without angina pectoris: Secondary | ICD-10-CM | POA: Diagnosis not present

## 2023-02-10 DIAGNOSIS — R0789 Other chest pain: Secondary | ICD-10-CM | POA: Diagnosis not present

## 2023-02-10 DIAGNOSIS — E11649 Type 2 diabetes mellitus with hypoglycemia without coma: Principal | ICD-10-CM | POA: Diagnosis present

## 2023-02-10 DIAGNOSIS — I25810 Atherosclerosis of coronary artery bypass graft(s) without angina pectoris: Secondary | ICD-10-CM | POA: Diagnosis present

## 2023-02-10 DIAGNOSIS — I4891 Unspecified atrial fibrillation: Secondary | ICD-10-CM | POA: Diagnosis not present

## 2023-02-10 DIAGNOSIS — Z743 Need for continuous supervision: Secondary | ICD-10-CM | POA: Diagnosis not present

## 2023-02-10 DIAGNOSIS — N390 Urinary tract infection, site not specified: Secondary | ICD-10-CM | POA: Diagnosis present

## 2023-02-10 DIAGNOSIS — I10 Essential (primary) hypertension: Secondary | ICD-10-CM | POA: Diagnosis present

## 2023-02-10 DIAGNOSIS — R6889 Other general symptoms and signs: Secondary | ICD-10-CM | POA: Diagnosis not present

## 2023-02-10 DIAGNOSIS — Z7902 Long term (current) use of antithrombotics/antiplatelets: Secondary | ICD-10-CM

## 2023-02-10 DIAGNOSIS — E782 Mixed hyperlipidemia: Secondary | ICD-10-CM | POA: Diagnosis not present

## 2023-02-10 LAB — CBC WITH DIFFERENTIAL/PLATELET
Abs Immature Granulocytes: 0.06 10*3/uL (ref 0.00–0.07)
Basophils Absolute: 0.1 10*3/uL (ref 0.0–0.1)
Basophils Relative: 0 %
Eosinophils Absolute: 0.1 10*3/uL (ref 0.0–0.5)
Eosinophils Relative: 0 %
HCT: 42.7 % (ref 36.0–46.0)
Hemoglobin: 14 g/dL (ref 12.0–15.0)
Immature Granulocytes: 0 %
Lymphocytes Relative: 12 %
Lymphs Abs: 1.8 10*3/uL (ref 0.7–4.0)
MCH: 29.3 pg (ref 26.0–34.0)
MCHC: 32.8 g/dL (ref 30.0–36.0)
MCV: 89.3 fL (ref 80.0–100.0)
Monocytes Absolute: 1.1 10*3/uL — ABNORMAL HIGH (ref 0.1–1.0)
Monocytes Relative: 7 %
Neutro Abs: 12.1 10*3/uL — ABNORMAL HIGH (ref 1.7–7.7)
Neutrophils Relative %: 81 %
Platelets: 282 10*3/uL (ref 150–400)
RBC: 4.78 MIL/uL (ref 3.87–5.11)
RDW: 13.2 % (ref 11.5–15.5)
WBC: 15.2 10*3/uL — ABNORMAL HIGH (ref 4.0–10.5)
nRBC: 0 % (ref 0.0–0.2)

## 2023-02-10 LAB — COMPREHENSIVE METABOLIC PANEL
ALT: 17 U/L (ref 0–44)
AST: 30 U/L (ref 15–41)
Albumin: 3.5 g/dL (ref 3.5–5.0)
Alkaline Phosphatase: 97 U/L (ref 38–126)
Anion gap: 11 (ref 5–15)
BUN: 21 mg/dL (ref 8–23)
CO2: 26 mmol/L (ref 22–32)
Calcium: 9.4 mg/dL (ref 8.9–10.3)
Chloride: 99 mmol/L (ref 98–111)
Creatinine, Ser: 0.89 mg/dL (ref 0.44–1.00)
GFR, Estimated: >60 mL/min (ref >60)
Glucose, Bld: 118 mg/dL — ABNORMAL HIGH (ref 70–99)
Potassium: 3.8 mmol/L (ref 3.5–5.1)
Sodium: 136 mmol/L (ref 135–145)
Total Bilirubin: 0.8 mg/dL (ref 0.3–1.2)
Total Protein: 7.3 g/dL (ref 6.5–8.1)

## 2023-02-10 LAB — CBG MONITORING, ED
Glucose-Capillary: 33 mg/dL — CL (ref 70–99)
Glucose-Capillary: 46 mg/dL — ABNORMAL LOW (ref 70–99)
Glucose-Capillary: 125 mg/dL — ABNORMAL HIGH (ref 70–99)
Glucose-Capillary: 88 mg/dL (ref 70–99)
Glucose-Capillary: 91 mg/dL (ref 70–99)
Glucose-Capillary: 113 mg/dL — ABNORMAL HIGH (ref 70–99)

## 2023-02-10 LAB — GLUCOSE, CAPILLARY: Glucose-Capillary: 105 mg/dL — ABNORMAL HIGH (ref 70–99)

## 2023-02-10 LAB — TROPONIN I (HIGH SENSITIVITY)
Troponin I (High Sensitivity): 49 ng/L — ABNORMAL HIGH (ref <18)
Troponin I (High Sensitivity): 44 ng/L — ABNORMAL HIGH (ref <18)

## 2023-02-10 MED ORDER — CHLORHEXIDINE GLUCONATE CLOTH 2 % EX PADS
6.0000 | MEDICATED_PAD | Freq: Every day | CUTANEOUS | Status: DC
  Administered 2023-02-10 – 2023-02-13 (×4): 6 via TOPICAL

## 2023-02-10 MED ORDER — DEXTROSE 10 % IV SOLN: Freq: Once | INTRAVENOUS | Status: AC

## 2023-02-10 MED ORDER — GABAPENTIN 300 MG PO CAPS
300.0000 mg | ORAL_CAPSULE | Freq: Three times a day (TID) | ORAL | Status: DC
  Administered 2023-02-10 – 2023-02-13 (×8): 300 mg via ORAL
  Filled 2023-02-10 (×9): qty 1

## 2023-02-10 MED ORDER — OXYCODONE HCL 5 MG PO TABS: 5.0000 mg | ORAL_TABLET | ORAL | Status: DC | PRN

## 2023-02-10 MED ORDER — SERTRALINE HCL 50 MG PO TABS
50.0000 mg | ORAL_TABLET | Freq: Every day | ORAL | Status: DC
  Administered 2023-02-11 – 2023-02-13 (×3): 50 mg via ORAL
  Filled 2023-02-10 (×3): qty 1

## 2023-02-10 MED ORDER — ASPIRIN 81 MG PO TBEC
81.0000 mg | DELAYED_RELEASE_TABLET | Freq: Every day | ORAL | Status: DC
  Administered 2023-02-11 – 2023-02-13 (×3): 81 mg via ORAL
  Filled 2023-02-10 (×3): qty 1

## 2023-02-10 MED ORDER — ONDANSETRON HCL 4 MG PO TABS: 4.0000 mg | ORAL_TABLET | Freq: Four times a day (QID) | ORAL | Status: DC | PRN

## 2023-02-10 MED ORDER — HEPARIN SODIUM (PORCINE) 5000 UNIT/ML IJ SOLN
5000.0000 [IU] | Freq: Three times a day (TID) | INTRAMUSCULAR | Status: DC
  Administered 2023-02-11 – 2023-02-13 (×7): 5000 [IU] via SUBCUTANEOUS
  Filled 2023-02-10 (×8): qty 1

## 2023-02-10 MED ORDER — ACETAMINOPHEN 325 MG PO TABS
650.0000 mg | ORAL_TABLET | Freq: Four times a day (QID) | ORAL | Status: DC | PRN
  Administered 2023-02-13 (×2): 650 mg via ORAL
  Filled 2023-02-10 (×2): qty 2

## 2023-02-10 MED ORDER — TICAGRELOR 90 MG PO TABS
90.0000 mg | ORAL_TABLET | Freq: Two times a day (BID) | ORAL | Status: DC
  Administered 2023-02-10 – 2023-02-13 (×6): 90 mg via ORAL
  Filled 2023-02-10 (×6): qty 1

## 2023-02-10 MED ORDER — LOSARTAN POTASSIUM 50 MG PO TABS
25.0000 mg | ORAL_TABLET | Freq: Every day | ORAL | Status: DC
  Administered 2023-02-11 – 2023-02-13 (×3): 25 mg via ORAL
  Filled 2023-02-10 (×3): qty 1

## 2023-02-10 MED ORDER — SERTRALINE HCL 50 MG PO TABS
25.0000 mg | ORAL_TABLET | Freq: Every day | ORAL | Status: DC
  Administered 2023-02-11 – 2023-02-13 (×3): 25 mg via ORAL
  Filled 2023-02-10 (×3): qty 1

## 2023-02-10 MED ORDER — ONDANSETRON HCL 4 MG/2ML IJ SOLN
4.0000 mg | Freq: Once | INTRAMUSCULAR | Status: AC
  Administered 2023-02-10: 4 mg via INTRAVENOUS
  Filled 2023-02-10: qty 2

## 2023-02-10 MED ORDER — DEXTROSE 10 % IV SOLN: INTRAVENOUS | Status: DC

## 2023-02-10 MED ORDER — ONDANSETRON HCL 4 MG/2ML IJ SOLN
4.0000 mg | Freq: Four times a day (QID) | INTRAMUSCULAR | Status: DC | PRN
  Administered 2023-02-10 – 2023-02-11 (×2): 4 mg via INTRAVENOUS
  Filled 2023-02-10 (×2): qty 2

## 2023-02-10 MED ORDER — ROSUVASTATIN CALCIUM 20 MG PO TABS
20.0000 mg | ORAL_TABLET | Freq: Every day | ORAL | Status: DC
  Administered 2023-02-11 – 2023-02-13 (×3): 20 mg via ORAL
  Filled 2023-02-10 (×3): qty 1

## 2023-02-10 MED ORDER — DEXTROSE 50 % IV SOLN
50.0000 mL | Freq: Once | INTRAVENOUS | Status: DC
  Filled 2023-02-10: qty 50

## 2023-02-10 MED ORDER — ACETAMINOPHEN 650 MG RE SUPP: 650.0000 mg | Freq: Four times a day (QID) | RECTAL | Status: DC | PRN

## 2023-02-10 MED ORDER — TAMSULOSIN HCL 0.4 MG PO CAPS
0.4000 mg | ORAL_CAPSULE | Freq: Every day | ORAL | Status: DC
  Administered 2023-02-11 – 2023-02-13 (×3): 0.4 mg via ORAL
  Filled 2023-02-10 (×3): qty 1

## 2023-02-10 MED ORDER — METOPROLOL TARTRATE 25 MG PO TABS
25.0000 mg | ORAL_TABLET | Freq: Two times a day (BID) | ORAL | Status: DC
  Administered 2023-02-10 – 2023-02-13 (×6): 25 mg via ORAL
  Filled 2023-02-10 (×6): qty 1

## 2023-02-10 NOTE — Assessment & Plan Note (Signed)
-  Continue rapaflo

## 2023-02-10 NOTE — Assessment & Plan Note (Signed)
-  Continue zoloft

## 2023-02-10 NOTE — Assessment & Plan Note (Signed)
Continue statin. 

## 2023-02-10 NOTE — ED Triage Notes (Signed)
BIB EMS- husband called 911 because she was saying "oh Lord" but would say nothing else. On EMS arrival, pt responded appropriately, c/o chest pain ongoing since approx 1430. Per husband pt recently had an MI but could offer no other information. Pt also reported right leg pain. EMS found blood sugar 65, gave oral glucose then at recheck was 63. Pt arrived A&O x3

## 2023-02-10 NOTE — ED Notes (Signed)
Multiple attempts were made at IV access. MD aware.

## 2023-02-10 NOTE — ED Provider Notes (Signed)
[facility] Provider Note   CSN: [id number] Arrival date & time: 02/10/23  1547     History {Add pertinent medical, surgical, social history, OB history to HPI:1} Chief Complaint  Patient presents with   Chest Pain    [name] is a 89 y.o. female.  Patient with a history of coronary artery disease.  Patient states she had some chest pain earlier today but is not hurting now.  Patient is significantly hypoglycemic   Chest Pain      Home Medications Prior to Admission medications   Medication Sig Start Date End Date Taking? Authorizing Provider  acetaminophen (TYLENOL) 500 MG tablet Take 500 mg by mouth every 6 (six) hours as needed for mild pain.    [provider]  aspirin EC 81 MG tablet Take 1 tablet (81 mg total) by mouth daily. Swallow whole. 01/05/23   Dunn, Nedra Hai, PA-C  blood glucose meter kit and supplies KIT Dispense based on patient and insurance preference. Use to TEST BLOOD GLUCOSE THREE times daily as directed. DX E11.65 02/10/21   Cassandria Anger, MD  Cholecalciferol (VITAMIN D3) 5000 units CAPS Take 1 capsule (5,000 Units total) by mouth daily. 04/15/17   Cassandria Anger, MD  diclofenac Sodium (VOLTAREN ARTHRITIS PAIN) 1 % GEL Apply 2 g topically 3 (three) times daily. bilateral knees for osteoarthritis 07/25/22   Gerlene Fee, NP  gabapentin (NEURONTIN) 300 MG capsule Take 300 mg three times daily 07/25/22   Gerlene Fee, NP  insulin glargine-yfgn (SEMGLEE) 100 UNIT/ML Pen Inject 20 Units into the skin at bedtime.    [provider]  insulin lispro (HUMALOG) 100 UNIT/ML injection Inject 3 Units into the skin 3 (three) times daily before meals.    [provider]  Insulin Pen Needle (BD AUTOSHIELD DUO) 30G X 5 MM MISC by Does not apply route as directed. 30 gauge X 3/16    [provider]  losartan (COZAAR) 25 MG tablet Take 1 tablet (25 mg total) by mouth  daily. 01/05/23   Dunn, Nedra Hai, PA-C  magnesium oxide (MAG-OX) 400 (240 Mg) MG tablet Take 200 mg by mouth daily.    [provider]  metFORMIN (GLUCOPHAGE) 500 MG tablet Take 500 mg by mouth daily.    [provider]  metoprolol tartrate (LOPRESSOR) 25 MG tablet Take 1 tablet (25 mg total) by mouth 2 (two) times daily. 01/04/23   Dunn, Nedra Hai, PA-C  nitroGLYCERIN (NITROSTAT) 0.4 MG SL tablet Place 1 tablet (0.4 mg total) under the tongue every 5 (five) minutes as needed for chest pain (up to 3 doses. If taking 3rd dose, call 911). 01/04/23   Dunn, Nedra Hai, PA-C  rosuvastatin (CRESTOR) 20 MG tablet Take 1 tablet (20 mg total) by mouth daily. 01/05/23   Dunn, Nedra Hai, PA-C  sertraline (ZOLOFT) 25 MG tablet Take 25 mg by mouth daily. Take with 67m dose to equal 715mdaily.    [provider]  sertraline (ZOLOFT) 50 MG tablet Take 50 mg by mouth daily. Take with 2546mose to equal 88m9mily.    [provider]  silodosin (RAPAFLO) 8 MG CAPS capsule Take 1 capsule (8 mg total) by mouth daily with breakfast. 10/10/22   Summerlin, JuliBerneice Heinrich-C  ticagrelor (BRILINTA) 90 MG TABS tablet Take 1 tablet (90 mg total) by mouth 2 (two) times daily. 01/04/23   DunnCharlie Pitter-C  Allergies    Codeine, Glipizide, and Sulfa antibiotics    Review of Systems   Review of Systems  Cardiovascular:  Positive for chest pain.    Physical Exam Updated Vital Signs BP (!) 147/94   Pulse (!) 125   Temp 98.4 F (36.9 C) (Oral)   Resp 17   SpO2 98%  Physical Exam  ED Results / Procedures / Treatments   Labs (all labs ordered are listed, but only abnormal results are displayed) Labs Reviewed  CBC WITH DIFFERENTIAL/PLATELET - Abnormal; Notable for the following components:      Result Value   WBC 15.2 (*)    Neutro Abs 12.1 (*)    Monocytes Absolute 1.1 (*)    All other components within normal limits  COMPREHENSIVE METABOLIC PANEL - Abnormal; Notable for the  following components:   Glucose, Bld 118 (*)    All other components within normal limits  CBG MONITORING, ED - Abnormal; Notable for the following components:   Glucose-Capillary 33 (*)    All other components within normal limits  CBG MONITORING, ED - Abnormal; Notable for the following components:   Glucose-Capillary 46 (*)    All other components within normal limits  CBG MONITORING, ED - Abnormal; Notable for the following components:   Glucose-Capillary 125 (*)    All other components within normal limits  TROPONIN I (HIGH SENSITIVITY) - Abnormal; Notable for the following components:   Troponin I (High Sensitivity) 49 (*)    All other components within normal limits  TROPONIN I (HIGH SENSITIVITY) - Abnormal; Notable for the following components:   Troponin I (High Sensitivity) 44 (*)    All other components within normal limits  CBG MONITORING, ED    EKG EKG Interpretation  Date/Time:  Sunday February 10 2023 16:03:46 EST Ventricular Rate:  94 PR Interval:  151 QRS Duration: 121 QT Interval:  402 QTC Calculation: 503 R Axis:   135 Text Interpretation: Sinus rhythm Atrial premature complexes RBBB and LPFB Inferior infarct, age indeterminate Consider anterolateral infarct Confirmed by Milton Ferguson [phone]) on 02/10/2023 4:24:35 PM  Radiology No results found.  Procedures Procedures  {Document cardiac monitor, telemetry assessment procedure when appropriate:1}  Medications Ordered in ED Medications  dextrose 10 % infusion ( Intravenous New Bag/Given 02/10/23 1917)  ondansetron (ZOFRAN) injection 4 mg (4 mg Intravenous Given 02/10/23 1815)    ED Course/ Medical Decision Making/ A&P   {   Click here for ABCD2, HEART and other calculatorsREFRESH Note before signing :1}                          Medical Decision Making Amount and/or Complexity of Data Reviewed Labs: ordered.  Risk Prescription drug management. Decision regarding hospitalization.   Patient will  be admitted for hypoglycemia and chest pain  {Document critical care time when appropriate:1} {Document review of labs and clinical decision tools ie heart score, Chads2Vasc2 etc:1}  {Document your independent review of radiology images, and any outside records:1} {Document your discussion with family members, caretakers, and with consultants:1} {Document social determinants of health affecting pt's care:1} {Document your decision making why or why not admission, treatments were needed:1} Final Clinical Impression(s) / ED Diagnoses Final diagnoses:  Chest pain at rest  Hypoglycemia    Rx / DC Orders ED Discharge Orders     None

## 2023-02-10 NOTE — ED Provider Notes (Signed)
  Ultrasound ED Peripheral IV (Provider)  Date/Time: 02/10/2023 4:42 PM  Performed by: Gwenevere Abbot, PA-C Authorized by: Gwenevere Abbot, PA-C   Procedure details:    Indications: multiple failed IV attempts     Skin Prep: chlorhexidine gluconate     Location: right upper arm.   Angiocath:  20 G   Bedside Ultrasound Guided: Yes     Images: not archived     Patient tolerated procedure without complications: Yes     Dressing applied: Yes       Gwenevere Abbot, PA-C 02/10/23 1642    Milton Ferguson, MD 02/12/23 1649

## 2023-02-10 NOTE — Assessment & Plan Note (Signed)
-  Possibly 2/2 to incorrectly administered insulin -UA to r/o infection -See plan for DMII

## 2023-02-10 NOTE — Assessment & Plan Note (Signed)
-  WBC 15.2 -CXR without acute changes -UA pending -No infectious symptoms -trend in the AM

## 2023-02-10 NOTE — Assessment & Plan Note (Signed)
-  Continue ARB, metoprolol

## 2023-02-10 NOTE — Assessment & Plan Note (Addendum)
-  Continue asa, arb, beta blocker, statin, Brilinta -intermittent chest pain today -down trending troponin 49>>44 -Monitor on tele

## 2023-02-10 NOTE — Assessment & Plan Note (Addendum)
-  Does not feel like when she had an MI before -EKG without acute ischemic changes -See plan for CAD -CXR is without acute abnormality -Currently chest pain free

## 2023-02-10 NOTE — Assessment & Plan Note (Addendum)
-  Currently hypoglycemic -Possibly incorrectly administered insulin -holding insulin -check hgb A1C -Regular diet -Continue D10 -q1 H glucose checks -Normal regimen should be 20units basal insulin QHS and 3units humalog with meals, and metformin -continue to monitor

## 2023-02-11 DIAGNOSIS — E876 Hypokalemia: Secondary | ICD-10-CM | POA: Diagnosis present

## 2023-02-11 DIAGNOSIS — E782 Mixed hyperlipidemia: Secondary | ICD-10-CM

## 2023-02-11 DIAGNOSIS — F32A Depression, unspecified: Secondary | ICD-10-CM | POA: Diagnosis present

## 2023-02-11 DIAGNOSIS — R079 Chest pain, unspecified: Secondary | ICD-10-CM

## 2023-02-11 DIAGNOSIS — E11649 Type 2 diabetes mellitus with hypoglycemia without coma: Secondary | ICD-10-CM | POA: Diagnosis present

## 2023-02-11 DIAGNOSIS — Z794 Long term (current) use of insulin: Secondary | ICD-10-CM | POA: Diagnosis not present

## 2023-02-11 DIAGNOSIS — L89312 Pressure ulcer of right buttock, stage 2: Secondary | ICD-10-CM | POA: Diagnosis present

## 2023-02-11 DIAGNOSIS — Z809 Family history of malignant neoplasm, unspecified: Secondary | ICD-10-CM | POA: Diagnosis not present

## 2023-02-11 DIAGNOSIS — Z823 Family history of stroke: Secondary | ICD-10-CM | POA: Diagnosis not present

## 2023-02-11 DIAGNOSIS — E785 Hyperlipidemia, unspecified: Secondary | ICD-10-CM | POA: Diagnosis present

## 2023-02-11 DIAGNOSIS — I2510 Atherosclerotic heart disease of native coronary artery without angina pectoris: Secondary | ICD-10-CM | POA: Diagnosis present

## 2023-02-11 DIAGNOSIS — Z882 Allergy status to sulfonamides status: Secondary | ICD-10-CM | POA: Diagnosis not present

## 2023-02-11 DIAGNOSIS — Z888 Allergy status to other drugs, medicaments and biological substances status: Secondary | ICD-10-CM | POA: Diagnosis not present

## 2023-02-11 DIAGNOSIS — I10 Essential (primary) hypertension: Secondary | ICD-10-CM | POA: Diagnosis present

## 2023-02-11 DIAGNOSIS — B964 Proteus (mirabilis) (morganii) as the cause of diseases classified elsewhere: Secondary | ICD-10-CM | POA: Diagnosis present

## 2023-02-11 DIAGNOSIS — D72829 Elevated white blood cell count, unspecified: Secondary | ICD-10-CM

## 2023-02-11 DIAGNOSIS — Z885 Allergy status to narcotic agent status: Secondary | ICD-10-CM | POA: Diagnosis not present

## 2023-02-11 DIAGNOSIS — Z79899 Other long term (current) drug therapy: Secondary | ICD-10-CM | POA: Diagnosis not present

## 2023-02-11 DIAGNOSIS — E1140 Type 2 diabetes mellitus with diabetic neuropathy, unspecified: Secondary | ICD-10-CM

## 2023-02-11 DIAGNOSIS — E162 Hypoglycemia, unspecified: Secondary | ICD-10-CM

## 2023-02-11 DIAGNOSIS — G9341 Metabolic encephalopathy: Secondary | ICD-10-CM | POA: Diagnosis present

## 2023-02-11 DIAGNOSIS — N3281 Overactive bladder: Secondary | ICD-10-CM | POA: Diagnosis not present

## 2023-02-11 DIAGNOSIS — E1165 Type 2 diabetes mellitus with hyperglycemia: Secondary | ICD-10-CM | POA: Diagnosis not present

## 2023-02-11 DIAGNOSIS — L89322 Pressure ulcer of left buttock, stage 2: Secondary | ICD-10-CM | POA: Diagnosis present

## 2023-02-11 DIAGNOSIS — Z8249 Family history of ischemic heart disease and other diseases of the circulatory system: Secondary | ICD-10-CM | POA: Diagnosis not present

## 2023-02-11 DIAGNOSIS — Z7982 Long term (current) use of aspirin: Secondary | ICD-10-CM | POA: Diagnosis not present

## 2023-02-11 DIAGNOSIS — N390 Urinary tract infection, site not specified: Secondary | ICD-10-CM | POA: Diagnosis present

## 2023-02-11 DIAGNOSIS — Z8673 Personal history of transient ischemic attack (TIA), and cerebral infarction without residual deficits: Secondary | ICD-10-CM | POA: Diagnosis not present

## 2023-02-11 DIAGNOSIS — Z7984 Long term (current) use of oral hypoglycemic drugs: Secondary | ICD-10-CM | POA: Diagnosis not present

## 2023-02-11 LAB — URINALYSIS, ROUTINE W REFLEX MICROSCOPIC
Bilirubin Urine: NEGATIVE
Glucose, UA: NEGATIVE mg/dL
Ketones, ur: 20 mg/dL — AB
Nitrite: NEGATIVE
Protein, ur: 100 mg/dL — AB
Specific Gravity, Urine: 1.02 (ref 1.005–1.030)
WBC, UA: >50 WBC/hpf (ref 0–5)
pH: 8 (ref 5.0–8.0)

## 2023-02-11 LAB — CBC WITH DIFFERENTIAL/PLATELET
Abs Immature Granulocytes: 0.06 10*3/uL (ref 0.00–0.07)
Basophils Absolute: 0 10*3/uL (ref 0.0–0.1)
Basophils Relative: 0 %
Eosinophils Absolute: 0 10*3/uL (ref 0.0–0.5)
Eosinophils Relative: 0 %
HCT: 38.8 % (ref 36.0–46.0)
Hemoglobin: 12.6 g/dL (ref 12.0–15.0)
Immature Granulocytes: 0 %
Lymphocytes Relative: 15 %
Lymphs Abs: 2.1 10*3/uL (ref 0.7–4.0)
MCH: 28.8 pg (ref 26.0–34.0)
MCHC: 32.5 g/dL (ref 30.0–36.0)
MCV: 88.6 fL (ref 80.0–100.0)
Monocytes Absolute: 1.3 10*3/uL — ABNORMAL HIGH (ref 0.1–1.0)
Monocytes Relative: 9 %
Neutro Abs: 10.4 10*3/uL — ABNORMAL HIGH (ref 1.7–7.7)
Neutrophils Relative %: 76 %
Platelets: 276 10*3/uL (ref 150–400)
RBC: 4.38 MIL/uL (ref 3.87–5.11)
RDW: 13 % (ref 11.5–15.5)
WBC: 14 10*3/uL — ABNORMAL HIGH (ref 4.0–10.5)
nRBC: 0 % (ref 0.0–0.2)

## 2023-02-11 LAB — COMPREHENSIVE METABOLIC PANEL
ALT: 14 U/L (ref 0–44)
AST: 21 U/L (ref 15–41)
Albumin: 2.9 g/dL — ABNORMAL LOW (ref 3.5–5.0)
Alkaline Phosphatase: 76 U/L (ref 38–126)
Anion gap: 10 (ref 5–15)
BUN: 19 mg/dL (ref 8–23)
CO2: 25 mmol/L (ref 22–32)
Calcium: 8.7 mg/dL — ABNORMAL LOW (ref 8.9–10.3)
Chloride: 100 mmol/L (ref 98–111)
Creatinine, Ser: 0.82 mg/dL (ref 0.44–1.00)
GFR, Estimated: >60 mL/min (ref >60)
Glucose, Bld: 184 mg/dL — ABNORMAL HIGH (ref 70–99)
Potassium: 3 mmol/L — ABNORMAL LOW (ref 3.5–5.1)
Sodium: 135 mmol/L (ref 135–145)
Total Bilirubin: 1.1 mg/dL (ref 0.3–1.2)
Total Protein: 6.1 g/dL — ABNORMAL LOW (ref 6.5–8.1)

## 2023-02-11 LAB — MRSA NEXT GEN BY PCR, NASAL: MRSA by PCR Next Gen: DETECTED — AB

## 2023-02-11 LAB — URINALYSIS, W/ REFLEX TO CULTURE (INFECTION SUSPECTED)
Bilirubin Urine: NEGATIVE
Glucose, UA: 150 mg/dL — AB
Ketones, ur: NEGATIVE mg/dL
Nitrite: NEGATIVE
Protein, ur: 30 mg/dL — AB
Specific Gravity, Urine: 1.011 (ref 1.005–1.030)
Trans Epithel, UA: <1
WBC, UA: >50 WBC/hpf (ref 0–5)
pH: 9 — ABNORMAL HIGH (ref 5.0–8.0)

## 2023-02-11 LAB — GLUCOSE, CAPILLARY
Glucose-Capillary: 122 mg/dL — ABNORMAL HIGH (ref 70–99)
Glucose-Capillary: 146 mg/dL — ABNORMAL HIGH (ref 70–99)
Glucose-Capillary: 180 mg/dL — ABNORMAL HIGH (ref 70–99)
Glucose-Capillary: 195 mg/dL — ABNORMAL HIGH (ref 70–99)
Glucose-Capillary: 240 mg/dL — ABNORMAL HIGH (ref 70–99)
Glucose-Capillary: 264 mg/dL — ABNORMAL HIGH (ref 70–99)
Glucose-Capillary: 269 mg/dL — ABNORMAL HIGH (ref 70–99)
Glucose-Capillary: 92 mg/dL (ref 70–99)

## 2023-02-11 LAB — TSH: TSH: 4.377 u[IU]/mL (ref 0.350–4.500)

## 2023-02-11 LAB — MAGNESIUM: Magnesium: 1.8 mg/dL (ref 1.7–2.4)

## 2023-02-11 LAB — HEMOGLOBIN A1C
Hgb A1c MFr Bld: 6.8 % — ABNORMAL HIGH (ref 4.8–5.6)
Mean Plasma Glucose: 148.46 mg/dL

## 2023-02-11 MED ORDER — MUPIROCIN 2 % EX OINT
1.0000 | TOPICAL_OINTMENT | Freq: Two times a day (BID) | CUTANEOUS | Status: DC
  Administered 2023-02-11 – 2023-02-13 (×5): 1 via NASAL
  Filled 2023-02-11 (×2): qty 22

## 2023-02-11 MED ORDER — ZOLPIDEM TARTRATE 5 MG PO TABS
5.0000 mg | ORAL_TABLET | Freq: Every evening | ORAL | Status: DC | PRN
  Administered 2023-02-11 – 2023-02-12 (×2): 5 mg via ORAL
  Filled 2023-02-11 (×2): qty 1

## 2023-02-11 MED ORDER — POTASSIUM CHLORIDE CRYS ER 20 MEQ PO TBCR
40.0000 meq | EXTENDED_RELEASE_TABLET | Freq: Once | ORAL | Status: AC
  Administered 2023-02-11: 40 meq via ORAL
  Filled 2023-02-11: qty 2

## 2023-02-11 MED ORDER — CHLORHEXIDINE GLUCONATE CLOTH 2 % EX PADS
6.0000 | MEDICATED_PAD | Freq: Every day | CUTANEOUS | Status: DC
  Administered 2023-02-11: 6 via TOPICAL

## 2023-02-11 MED ORDER — INSULIN ASPART 100 UNIT/ML IJ SOLN
0.0000 [IU] | INTRAMUSCULAR | Status: DC
  Administered 2023-02-11 (×2): 2 [IU] via SUBCUTANEOUS
  Administered 2023-02-11: 5 [IU] via SUBCUTANEOUS
  Administered 2023-02-12: 2 [IU] via SUBCUTANEOUS

## 2023-02-11 MED ORDER — SODIUM CHLORIDE 0.9 % IV SOLN
1.0000 g | INTRAVENOUS | Status: DC
  Administered 2023-02-11 – 2023-02-12 (×2): 1 g via INTRAVENOUS
  Filled 2023-02-11 (×3): qty 10

## 2023-02-11 NOTE — Progress Notes (Signed)
Pt arrived to room 321 via bed from ICU. Pt alert and oriented x3, denies any c/o at this time. Lungs clear to auscultation, apical HR RRR, tele applied SR/1 degree block in 70's. All VSS. Pt moves all extremities on command, states unable to walk due to left leg weakness s/p prior CVA. Abd  soft, bowel sounds active x4. Skin warm and dry, color appropriate ,cap refill < 3 sec. Pt had small incontinent bowel movement, soft brown stool. Pt cleaned and new sacral foam applied. Pt has 2 areas of Stg 2 skin breakdown, one on each buttock. See LDA. Pure wick applied for urinary incontinence.  Bed alarm on for safety. Call bell within reach.

## 2023-02-11 NOTE — TOC Progression Note (Addendum)
Transition of Care [facility]) - Progression Note    Patient Details  Name: [name] MRN: [id number] Date of Birth: 10-22-1929  Transition of Care [facility]) CM/SW Contact  Salome Arnt, [place] Phone Number: 02/11/2023, 3:40 PM  Clinical Narrative:   Transition of Care [facility]) Screening Note   Patient Details  Name: [name] Date of Birth: October 08, 1929   Transition of Care [facility]) CM/SW Contact:    Salome Arnt, LCSW Phone Number: 02/11/2023, 3:40 PM  PT pending. TOC will follow.   Transition of Care Department [facility]) has reviewed patient and no TOC needs have been identified at this time. We will continue to monitor patient advancement through interdisciplinary progression rounds. If new patient transition needs arise, please place a TOC consult.         Barriers to Discharge: Continued Medical Work up  Expected Discharge Plan and Services                                               Social Determinants of Health (SDOH) Interventions SDOH Screenings   Food Insecurity: No Food Insecurity (02/10/2023)  Housing: Low Risk  (02/10/2023)  Transportation Needs: No Transportation Needs (02/10/2023)  Utilities: Not At Risk (02/10/2023)  Physical Activity: Inactive (02/08/2019)  Social Connections: Unknown (02/08/2019)  Stress: No Stress Concern Present (02/08/2019)  Tobacco Use: Low Risk  (02/10/2023)    Readmission Risk Interventions    01/04/2023   11:03 AM  Readmission Risk Prevention Plan  Transportation Screening Complete  Social Work Consult for [place] Planning/Counseling Complete

## 2023-02-11 NOTE — H&P (Signed)
History and Physical    Patient: [name] [id number] DOB: 11-22-1929 DOA: 02/10/2023 DOS: the patient was seen and examined on 02/11/2023 PCP: Sharilyn Sites, MD  Patient coming from: Home  Chief Complaint:  Chief Complaint  Patient presents with   Chest Pain   HPI: [name] is a 89 y.o. female with medical history significant of diabetes mellitus, hypertension, stroke, urinary incontinence, presents to the ED with a chief complaint of chest pain.  Patient reports that it does not feel like when she had an MI recently.  Pain is substernal, but it feels more like tingling.  It is worse with exertion.  When the pain is there it lasts seconds to minutes.  She has no associated dyspnea, palpitations, dizziness, diaphoresis.  She has felt increasing fatigue.  Her last normal meal was breakfast.  Patient reports she has had a decreased appetite.  She has not taken any insulin today.  She reports she did take her basal insulin last night.  When asked how much insulin she takes she reports she is really not sure, but she thinks it is 100 units.  According to her chart review she supposed to take 20 units of insulin at night.  I asked her if she does not, she is supposed to take, then how does she take it?  She reports, she is supposed to take as written on the insulin.  Patient denies any infectious symptoms.  She reports no change in her urine, however she has incontinence with hard for her to tell.  Patient has no other complaints at this time.  Patient does not smoke, does not drink, is vaccinated for COVID.  Patient is full code. Review of Systems: As mentioned in the history of present illness. All other systems reviewed and are negative. Past Medical History:  Diagnosis Date   Diabetes mellitus with neuropathy    Diabetic neuropathy ([place])    Hypertension    Stroke [facility])    Urinary incontinence    Past Surgical History:  Procedure Laterality Date   ABDOMINAL  HYSTERECTOMY     APPENDECTOMY     BREAST SURGERY     begnin tumor removed   CATARACT EXTRACTION, BILATERAL     CORONARY ANGIOGRAPHY N/A 12/31/2022   Procedure: CORONARY ANGIOGRAPHY;  Surgeon: [name], MD;  Location: [facility] CV [facility];  Service: Cardiovascular;  Laterality: N/A;   CORONARY/GRAFT ACUTE MI REVASCULARIZATION N/A 12/31/2022   Procedure: Coronary/Graft Acute MI Revascularization;  Surgeon: [name], MD;  Location: [facility] CV [facility];  Service: Cardiovascular;  Laterality: N/A;   Social History:  reports that she has never smoked. She has never used smokeless tobacco. She reports that she does not drink alcohol and does not use drugs.  Allergies  Allergen Reactions   Codeine Nausea And Vomiting   Glipizide     Hypoglycemia with glucoses recorded in the 20s necessitating glucagon and D10 IV   Sulfa Antibiotics Other (See Comments)    Unspecified     Family History  Problem Relation Age of Onset   CAD Mother    Hypertension Mother    Stroke Father    Heart attack Brother    Cancer Brother     Prior to Admission medications   Medication Sig Start Date End Date Taking? Authorizing Provider  acetaminophen (TYLENOL) 500 MG tablet Take 500 mg by mouth every 6 (six) hours as needed for mild pain.    [provider]  aspirin EC 81 MG  tablet Take 1 tablet (81 mg total) by mouth daily. Swallow whole. 01/05/23   Dunn, Nedra Hai, PA-C  blood glucose meter kit and supplies KIT Dispense based on patient and insurance preference. Use to TEST BLOOD GLUCOSE THREE times daily as directed. DX E11.65 02/10/21   Cassandria Anger, MD  Cholecalciferol (VITAMIN D3) 5000 units CAPS Take 1 capsule (5,000 Units total) by mouth daily. 04/15/17   Cassandria Anger, MD  diclofenac Sodium (VOLTAREN ARTHRITIS PAIN) 1 % GEL Apply 2 g topically 3 (three) times daily. bilateral knees for osteoarthritis 07/25/22   Gerlene Fee, NP  gabapentin (NEURONTIN) 300 MG capsule Take 300  mg three times daily 07/25/22   Gerlene Fee, NP  insulin glargine-yfgn (SEMGLEE) 100 UNIT/ML Pen Inject 20 Units into the skin at bedtime.    [provider]  insulin lispro (HUMALOG) 100 UNIT/ML injection Inject 3 Units into the skin 3 (three) times daily before meals.    [provider]  Insulin Pen Needle (BD AUTOSHIELD DUO) 30G X 5 MM MISC by Does not apply route as directed. 30 gauge X 3/16    [provider]  losartan (COZAAR) 25 MG tablet Take 1 tablet (25 mg total) by mouth daily. 01/05/23   Dunn, Nedra Hai, PA-C  magnesium oxide (MAG-OX) 400 (240 Mg) MG tablet Take 200 mg by mouth daily.    [provider]  metFORMIN (GLUCOPHAGE) 500 MG tablet Take 500 mg by mouth daily.    [provider]  metoprolol tartrate (LOPRESSOR) 25 MG tablet Take 1 tablet (25 mg total) by mouth 2 (two) times daily. 01/04/23   Dunn, Nedra Hai, PA-C  nitroGLYCERIN (NITROSTAT) 0.4 MG SL tablet Place 1 tablet (0.4 mg total) under the tongue every 5 (five) minutes as needed for chest pain (up to 3 doses. If taking 3rd dose, call 911). 01/04/23   Dunn, Nedra Hai, PA-C  rosuvastatin (CRESTOR) 20 MG tablet Take 1 tablet (20 mg total) by mouth daily. 01/05/23   Dunn, Nedra Hai, PA-C  sertraline (ZOLOFT) 25 MG tablet Take 25 mg by mouth daily. Take with 54m dose to equal 761mdaily.    [provider]  sertraline (ZOLOFT) 50 MG tablet Take 50 mg by mouth daily. Take with 2537mose to equal 61m35mily.    [provider]  silodosin (RAPAFLO) 8 MG CAPS capsule Take 1 capsule (8 mg total) by mouth daily with breakfast. 10/10/22   [place]lin, JuliBerneice Heinrich-C  ticagrelor (BRILINTA) 90 MG TABS tablet Take 1 tablet (90 mg total) by mouth 2 (two) times daily. 01/04/23   DunnCharlie Pitter-C    Physical Exam: Vitals:   02/10/23 2025 02/10/23 2052 02/10/23 2226 02/11/23 0018  BP:   (!) 170/77   Pulse: (!) 37 94 99   Resp: 15 14 (!) 25   Temp:   98 F (36.7 C) 98.3 F  (36.8 C)  TempSrc:   Oral Oral  SpO2: 94% 98% 99%   Weight:   75.8 kg   Height:   5' 8"$  (1.727 m)    1.  General: Patient lying supine in bed,  no acute distress   2. Psychiatric: Alert and oriented x 3, mood and behavior normal for situation, pleasant and cooperative with exam   3. Neurologic: Speech and language are normal, face is symmetric, moves all 4 extremities voluntarily, at baseline without acute deficits on limited exam   4. HEENMT:  Head is atraumatic, normocephalic, pupils  reactive to light, neck is supple, trachea is midline, mucous membranes are moist   5. Respiratory : Lungs are clear to auscultation bilaterally without wheezing, rhonchi, rales, no cyanosis, no increase in work of breathing or accessory muscle use   6. Cardiovascular : Heart rate normal, rhythm is regular, no murmurs, rubs or gallops, no peripheral edema, peripheral pulses palpated   7. Gastrointestinal:  Abdomen is soft, nondistended, nontender to palpation bowel sounds active, no masses or organomegaly palpated   8. Skin:  Skin is warm, dry and intact without rashes, acute lesions, or ulcers on limited exam   9.Musculoskeletal:  No acute deformities or trauma, no asymmetry in tone, no peripheral edema, peripheral pulses palpated, no tenderness to palpation in the extremities  Data Reviewed: In the ED Temp 97.6-98.4, heart rate 94-96, respiratory rate 18-20, blood pressure 152/84-187/89, satting at 100% on room air Leukocytosis of 15.2, hemoglobin 14.0, platelets 282 Dian Situ is a unremarkable with a glucose of 118, however on arrival patient's glucose was 33 she required D50 and food to bring it up Shows a heart rate of 94, sinus rhythm, QTc 503 Glucose readings 33, 46, 125, 88 at admission-this is on D10 Repeat EKG due to irregular beats on auscultation shows heart rate 98, sinus rhythm, multiple PACs, QTc 479 Patient reported feeling better after glucose was normalized, she is new over the  morning, placed in obs for hypoglycemia  Assessment and Plan: * Hypoglycemia -Possibly 2/2 to incorrectly administered insulin -UA to r/o infection -See plan for DMII  Essential hypertension -Continue ARB, metoprolol  Depression -Continue zoloft   Chest pain -Does not feel like when she had an MI before -EKG without acute ischemic changes -See plan for CAD -CXR is without acute abnormality -Currently chest pain free  Leukocytosis -WBC 15.2 -CXR without acute changes -UA pending -No infectious symptoms -trend in the AM  CAD (coronary artery disease) -Continue asa, arb, beta blocker, statin, Brilinta -intermittent chest pain today -down trending troponin 49>>44 -Monitor on tele  Hyperlipidemia -Continue statin  Controlled type 2 diabetes with neuropathy (H[facility]) -Currently hypoglycemic -Possibly incorrectly administered insulin -holding insulin -check hgb A1C -Regular diet -Continue D10 -q1 H glucose checks -Normal regimen should be 20units basal insulin QHS and 3units humalog with meals, and metformin -continue to monitor  Overactive bladder-resolved as of 02/10/2023 -Continue rapaflo      Advance Care Planning:   Code Status: Full Code  Consults: None at this time  Family Communication: No family at bedside  Severity of Illness: The appropriate patient status for this patient is OBSERVATION. Observation status is judged to be reasonable and necessary in order to provide the required intensity of service to ensure the patient's safety. The patient's presenting symptoms, physical exam findings, and initial radiographic and laboratory data in the context of their medical condition is felt to place them at decreased risk for further clinical deterioration. Furthermore, it is anticipated that the patient will be medically stable for discharge from the hospital within 2 midnights of admission.   Author: Rolla Plate, DO 02/11/2023 2:33 AM  For on call  review [email or web address].

## 2023-02-11 NOTE — Inpatient Diabetes Management (Addendum)
Inpatient Diabetes Program Recommendations  AACE/ADA: New Consensus Statement on Inpatient Glycemic Control (2015)  Target Ranges:  Prepandial:   less than 140 mg/dL      Peak postprandial:   less than 180 mg/dL (1-2 hours)      Critically ill patients:  140 - 180 mg/dL   Lab Results  Component Value Date   GLUCAP 264 (H) 02/11/2023   HGBA1C 6.8 (H) 02/11/2023    Review of Glycemic Control  Latest Reference Range & Units 02/11/23 11:42  Glucose-Capillary 70 - 99 mg/dL 264 (H)  (H): Data is abnormally high Diabetes history: Type 2 DM Outpatient Diabetes medications: Semglee 20 units QHS, Humalog 3 units TID, Metformin 500 mg QD (NT) Current orders for Inpatient glycemic control: Novolog 0-9 units Q4H  Inpatient Diabetes Program Recommendations:    Noted consult, in agreement with new orders.   Attempted to reach patient by phone x 2, working remote from another campus. Will continue to attempt. Contact numbers attempted, no answer.  Thanks, Bronson Curb, MSN, RNC-OB Diabetes Coordinator [phone] (8a-5p)

## 2023-02-11 NOTE — Progress Notes (Signed)
Patient alert and oriented. Writer called to room by patient due to patient had vomited x1 post breakfast. Moderate amount of vomited noted which did have food appearance. Patient stated, "it just came out." Patient denied feeling nauseous and denies any shortness of breath. No further vomiting noted at this time. Patient did get PO potassium pills (split in half) which patient tolerated well about 30 minutes prior to vomiting. Dr Manuella Ghazi made aware.   Dr Manuella Ghazi was also made aware of patient 0800 CBG check result of 240. D10% IV fluids stopped/discontinued per Dr Manuella Ghazi.

## 2023-02-11 NOTE — Progress Notes (Signed)
[name] is a 89 y.o. female with medical history significant of diabetes mellitus, hypertension, stroke, urinary incontinence, presents to the ED with a chief complaint of chest pain.  Patient reports that it does not feel like when she had an MI recently.  She appears to have had a decreased appetite recently and some nausea and vomiting after admission.  She appears unsure of how much insulin she is supposed to take on a regular basis and was admitted with hypoglycemia likely secondary to incorrect insulin dose.  She was started on D10 IV fluid and has improvement in her blood glucose levels, but continues to have some intractable nausea and vomiting.  Her hemoglobin A1c has returned at 6.8%.  Patient seen and evaluated at bedside and continues to have some trouble with p.o. intake.  Okay for transfer to telemetry and will require continued monitoring off of D10 infusion.  Diabetes coordinator consulted.  Continue to follow a.m. labs.  Multiple family members called with no response noted. She appears to have recently left [facility] and is now at home.  It appears that she may also have UTI and will be started on Rocephin empirically with cultures pending.  Total care time: 30 minutes.

## 2023-02-11 NOTE — Progress Notes (Signed)
Date and time results received: 02/11/23 0953 (use smartphrase ".now" to insert current time)  Test: MRSA PCR Critical Value: positive  Name of Provider Notified: Manuella Ghazi, MD.  Orders Received? Or Actions Taken?:  Standing orders placed.

## 2023-02-12 DIAGNOSIS — E162 Hypoglycemia, unspecified: Secondary | ICD-10-CM | POA: Diagnosis not present

## 2023-02-12 LAB — GLUCOSE, CAPILLARY
Glucose-Capillary: 142 mg/dL — ABNORMAL HIGH (ref 70–99)
Glucose-Capillary: 169 mg/dL — ABNORMAL HIGH (ref 70–99)
Glucose-Capillary: 308 mg/dL — ABNORMAL HIGH (ref 70–99)
Glucose-Capillary: 334 mg/dL — ABNORMAL HIGH (ref 70–99)
Glucose-Capillary: 344 mg/dL — ABNORMAL HIGH (ref 70–99)
Glucose-Capillary: 51 mg/dL — ABNORMAL LOW (ref 70–99)
Glucose-Capillary: 52 mg/dL — ABNORMAL LOW (ref 70–99)
Glucose-Capillary: 58 mg/dL — ABNORMAL LOW (ref 70–99)
Glucose-Capillary: 80 mg/dL (ref 70–99)

## 2023-02-12 LAB — CBC
HCT: 36.9 % (ref 36.0–46.0)
Hemoglobin: 11.9 g/dL — ABNORMAL LOW (ref 12.0–15.0)
MCH: 29.1 pg (ref 26.0–34.0)
MCHC: 32.2 g/dL (ref 30.0–36.0)
MCV: 90.2 fL (ref 80.0–100.0)
Platelets: 260 10*3/uL (ref 150–400)
RBC: 4.09 MIL/uL (ref 3.87–5.11)
RDW: 13.2 % (ref 11.5–15.5)
WBC: 10.9 10*3/uL — ABNORMAL HIGH (ref 4.0–10.5)
nRBC: 0 % (ref 0.0–0.2)

## 2023-02-12 LAB — BASIC METABOLIC PANEL
Anion gap: 8 (ref 5–15)
BUN: 19 mg/dL (ref 8–23)
CO2: 25 mmol/L (ref 22–32)
Calcium: 8.6 mg/dL — ABNORMAL LOW (ref 8.9–10.3)
Chloride: 101 mmol/L (ref 98–111)
Creatinine, Ser: 0.9 mg/dL (ref 0.44–1.00)
GFR, Estimated: >60 mL/min (ref >60)
Glucose, Bld: 174 mg/dL — ABNORMAL HIGH (ref 70–99)
Potassium: 3.3 mmol/L — ABNORMAL LOW (ref 3.5–5.1)
Sodium: 134 mmol/L — ABNORMAL LOW (ref 135–145)

## 2023-02-12 LAB — MAGNESIUM: Magnesium: 1.7 mg/dL (ref 1.7–2.4)

## 2023-02-12 MED ORDER — DEXTROSE 5 % IV SOLN: INTRAVENOUS | Status: DC

## 2023-02-12 MED ORDER — LIVING WELL WITH DIABETES BOOK: Freq: Once | Status: AC

## 2023-02-12 MED ORDER — POTASSIUM CHLORIDE CRYS ER 20 MEQ PO TBCR
40.0000 meq | EXTENDED_RELEASE_TABLET | Freq: Once | ORAL | Status: AC
  Administered 2023-02-12: 40 meq via ORAL
  Filled 2023-02-12: qty 2

## 2023-02-12 MED ORDER — INSULIN ASPART 100 UNIT/ML IJ SOLN
0.0000 [IU] | INTRAMUSCULAR | Status: DC
  Administered 2023-02-12 – 2023-02-13 (×4): 4 [IU] via SUBCUTANEOUS
  Administered 2023-02-13: 1 [IU] via SUBCUTANEOUS
  Administered 2023-02-13: 3 [IU] via SUBCUTANEOUS

## 2023-02-12 NOTE — TOC Initial Note (Addendum)
Transition of Care [facility]) - Initial/Assessment Note    Patient Details  Name: [name] MRN: [id number] Date of Birth: 1929-08-20  Transition of Care [facility]) CM/SW Contact:    Salome Arnt, LCSW Phone Number: 02/12/2023, 11:30 AM  Clinical Narrative: Pt admitted due to hypoglycemia. Assessment completed with pt's daughter, Butch Penny. Butch Penny reports pt was at [facility] from August until January and then went to SNF at [facility]. She wanted to return home, so pt's husband took her home with [facility] HHPT/OT. Per Butch Penny, things have not been going well at home and pt is not taking medications. PT evaluated pt and recommend SNF. In order for pt's husband to be able to visit, Butch Penny requests [place] SN[facility]. Reviewed Medicare.gov ratings. Butch Penny indicates pt has dementia. She will usually listen to her son so they plan to have him discuss with her plan for pt to go to SNF until they can get private duty care arranged at home. TOC will initiate bed search and authorization.       Briefly discussed d/c plan with pt who is agreeable.               Expected Discharge Plan: Skilled Nursing Facility Barriers to Discharge: Continued Medical Work up   Patient Goals and CMS Choice Patient states their goals for this hospitalization and ongoing recovery are:: SNF   Choice offered to / list presented to : Adult [place] ownership interest in [facility].provided to:: Adult Children    Expected Discharge Plan and Services In-house Referral: Clinical Social Work   Post Acute Care Choice: [place] Living arrangements for the past 2 months: [place]                                      Prior Living Arrangements/Services Living arrangements for the past 2 months: Single Family Home Lives with:: Spouse Patient language and need for interpreter reviewed:: Yes Do you feel safe going back to the place where you live?: Yes      Need  for Family Participation in Patient Care: Yes (Comment)   Current home services: Home OT, Home PT Criminal Activity/Legal Involvement Pertinent to Current Situation/Hospitalization: No - Comment as needed  Activities of Daily Living Home Assistive Devices/Equipment: Wheelchair ADL Screening (condition at time of admission) Patient's cognitive ability adequate to safely complete daily activities?: No Is the patient deaf or have difficulty hearing?: Yes Does the patient have difficulty seeing, even when wearing glasses/contacts?: No Does the patient have difficulty concentrating, remembering, or making decisions?: No Patient able to express need for assistance with ADLs?: Yes Does the patient have difficulty dressing or bathing?: No Independently performs ADLs?: Yes (appropriate for developmental age) Does the patient have difficulty walking or climbing stairs?: Yes Weakness of Legs: Both Weakness of Arms/Hands: None  Permission Sought/Granted                  Emotional Assessment         Alcohol / Substance Use: Not Applicable Psych Involvement: No (comment)  Admission diagnosis:  Hypoglycemia [E16.2] Chest pain at rest 123456 Acute metabolic encephalopathy [id number] Patient Active Problem List   Diagnosis Date Noted   Hypoglycemia 02/10/2023   Chest pain 02/10/2023   Depression 02/10/2023   CAD (coronary artery disease) 01/04/2023   Pressure injury of skin 01/04/2023   Physical debility 01/04/2023   Leukocytosis 01/04/2023  Acute ST elevation myocardial infarction (STEMI) of inferior wall (HCC) 12/31/2022   SARS-CoV-2 positive 08/24/2022   SARS-CoV-2 antibody positive 08/23/2022   Emphysema of lung ([place]) 08/21/2022   Vascular dementia without behavioral disturbance ([place]) 08/21/2022   Decubitus ulcer of buttock, stage 2 ([place]) 08/13/2022   Hypomagnesemia 08/03/2022   Hypoglycemia associated with diabetes ([place]) 08/02/2022   Hypokalemia 08/02/2022   Poorly  controlled type 2 diabetes mellitus with neuropathy ([place]) 07/19/2022   Diabetic neuropathy associated with type 2 diabetes mellitus ([place]) 07/16/2022   Hypertension associated with type 2 diabetes mellitus ([place]) 07/16/2022   Hyperlipidemia associated with type 2 diabetes mellitus ([place]) 07/16/2022   Chronic insomnia 07/16/2022   Bilateral lower extremity edema 07/16/2022   Chronic constipation 07/16/2022   Aortic atherosclerosis ([place]) 07/16/2022   Major neurocognitive disorder ([place]) 07/16/2022   DJD (degenerative joint disease) of knee 07/14/2022   UTI (urinary tract infection) 07/09/2022   Late effects of cerebrovascular disease [id number]   Acute metabolic encephalopathy [id number]   Cellulitis of buttock 07/14/2019   Hyperlipidemia 02/20/2016   Essential hypertension 10/10/2015   Controlled type 2 diabetes with neuropathy ([place]) 10/01/2008   PCP:  Sharilyn Sites, MD Pharmacy:   [facility], [place] [address] Phone: [phone] Fax: [place], [place] - [address] [place] [address] Phone: [phone] Fax: [phone]     Social Determinants of Health (SDOH) Social History: SDOH Screenings   Food Insecurity: No Food Insecurity (02/10/2023)  Housing: Low Risk  (02/10/2023)  Transportation Needs: No Transportation Needs (02/10/2023)  Utilities: Not At Risk (02/10/2023)  Physical Activity: Inactive (02/08/2019)  Social Connections: Unknown (02/08/2019)  Stress: No Stress Concern Present (02/08/2019)  Tobacco Use: Low Risk  (02/10/2023)   SDOH Interventions: Housing Interventions: Intervention Not Indicated   Readmission Risk Interventions    02/12/2023   11:26 AM 01/04/2023   11:03 AM  Readmission Risk Prevention Plan  Transportation Screening Complete Complete  HRI or [place] Complete   Social Work Consult  for [place] Planning/Counseling Complete Complete  Palliative Care Screening Not Applicable   Medication Review Press photographer) Complete

## 2023-02-12 NOTE — Inpatient Diabetes Management (Addendum)
Inpatient Diabetes Program Recommendations  AACE/ADA: New Consensus Statement on Inpatient Glycemic Control (2015)  Target Ranges:  Prepandial:   less than 140 mg/dL      Peak postprandial:   less than 180 mg/dL (1-2 hours)      Critically ill patients:  140 - 180 mg/dL   Lab Results  Component Value Date   GLUCAP 142 (H) 02/12/2023   HGBA1C 6.8 (H) 02/11/2023    Review of Glycemic Control  Latest Reference Range & Units 02/12/23 00:32 02/12/23 00:53 02/12/23 01:11 02/12/23 04:14 02/12/23 07:30  Glucose-Capillary 70 - 99 mg/dL 58 (L) 51 (L) 80 169 (H) 142 (H)  (L): Data is abnormally low (H): Data is abnormally high Diabetes history: Type 2 DM Outpatient Diabetes medications: Semglee 20 units QHS, Humalog 3 units TID, Metformin 500 mg QD (NT) Current orders for Inpatient glycemic control: Novolog 0-9 units Q4H   Inpatient Diabetes Program Recommendations:    Noted hypoglycemia this AM of 51 mg/dL. This was followed by Novolog 7 units. Of note, correction was given more than two hours late from previous blood sugar which assuming contributed.  Consider reducing correction to Novolog 0-6 units Q4H.   Addendum: Would also consider adding Semglee 5 units QD.   Spoke with patient regarding outpatient diabetes management. Patinet unable to remember untis taken or what is ordered for insulin. She staes, "I just read it on the label." When asked if there could be errors, she verifies that it could be possible. Also, mentions that her intake has significantly decreased recently. Reviewed patient's current A1c of 6.8% and anticipated A1C for age range per ADA. Explained what a A1c is and what it measures. Also reviewed goal A1c with patient, importance of good glucose control @ home, and blood sugar goals. Reviewed patho of DM, survival skills, interventions, risks for hypoglycemia, medications errors, vascular changes and commorbidities.  Patient has a meter and supplies. Reviewed at length  when to reach out to MD. Husband is available to assist. Reports only checking blood sugar once per day. Reports injections given twice per day which conflicts with MD order.  LWWDM booklet ordered per patient request. Secure chat sent to MD.  Patient to SNF; in agreement.    Thanks, Bronson Curb, MSN, RNC-OB Diabetes Coordinator [phone] (8a-5p)

## 2023-02-12 NOTE — Progress Notes (Signed)
PROGRESS NOTE    [name]  [id number] DOB: 10/11/1929 DOA: 02/10/2023 PCP: Sharilyn Sites, MD   Brief Narrative:    [name] is a 89 y.o. female with medical history significant of diabetes mellitus, hypertension, stroke, urinary incontinence, presents to the ED with a chief complaint of chest pain.  Patient reports that it does not feel like when she had an MI recently.  She appears to have had a decreased appetite recently and some nausea and vomiting after admission.  She appears unsure of how much insulin she is supposed to take on a regular basis and was admitted with hypoglycemia likely secondary to incorrect insulin dose.  She was started on D10 IV fluid and has improvement in her blood glucose levels, but continues to have some intractable nausea and vomiting.  Her hemoglobin A1c has returned at 6.8%.  She was also empirically started on Rocephin for presumed UTI with cultures pending.  PT evaluation with likely need for placement.  Assessment & Plan:   Principal Problem:   Hypoglycemia Active Problems:   Acute metabolic encephalopathy   Essential hypertension   Controlled type 2 diabetes with neuropathy (HCC)   Hyperlipidemia   CAD (coronary artery disease)   Leukocytosis   Chest pain   Depression  Assessment and Plan:  Persistent hypoglycemia in the setting of type 2 diabetes with neuropathy -Possibly 2/2 to incorrectly administered insulin -Continue on very sensitive SSI per coordinator recommendations -Hemoglobin A1c 6.8% -Continue D5 IV fluid with recurrent hypoglycemia noted overnight -Monitor for oral intake  Possible UTI with leukocytosis -Continue empiric coverage with Rocephin -Urine cultures pending  Mild hypokalemia -Replete and reevaluate in a.m.   Essential hypertension -Continue ARB, metoprolol   Depression -Continue zoloft     CAD (coronary artery disease) -Continue asa, arb, beta blocker, statin, Brilinta -No further  chest pain noted, monitor on telemetry -down trending troponin 49>>44 -Monitor on tele   Hyperlipidemia -Continue statin  Overactive bladder-resolved as of 02/10/2023 -Continue rapaflo    DVT prophylaxis:Heparin Code Status: Full Family Communication: Discussed with daughter on phone 2/19 Disposition Plan: Likely need for SNF/ALF with PT evaluation pending Status is: Inpatient Remains inpatient appropriate because: Need for IVF.   Skin Assessment:  I have examined the patient's skin and I agree with the wound assessment as performed by the wound care RN as outlined below:  Pressure Injury 02/11/23 Buttocks Left Stage 2 -  Partial thickness loss of dermis presenting as a shallow open injury with a red, pink wound bed without slough. 0.5 cm x 3 cm x 0 cm, shallow pink base, looks like prior site of skin tear (Active)  02/11/23 1200  Location: Buttocks  Location Orientation: Left  Staging: Stage 2 -  Partial thickness loss of dermis presenting as a shallow open injury with a red, pink wound bed without slough.  Wound Description (Comments): 0.5 cm x 3 cm x 0 cm, shallow pink base, looks like prior site of skin tear  Present on Admission: Yes     Pressure Injury 02/11/23 Buttocks Right Stage 2 -  Partial thickness loss of dermis presenting as a shallow open injury with a red, pink wound bed without slough. 0.5cm x 1 cm - shallow pink base, no drainage (Active)  02/11/23 1200  Location: Buttocks  Location Orientation: Right  Staging: Stage 2 -  Partial thickness loss of dermis presenting as a shallow open injury with a red, pink wound bed without slough.  Wound Description (Comments): 0.5cm  x 1 cm - shallow pink base, no drainage  Present on Admission: Yes    Consultants:  None  Procedures:  None  Antimicrobials:  Anti-infectives (From admission, onward)    Start     Dose/Rate Route Frequency Ordered Stop   02/11/23 1400  cefTRIAXone (ROCEPHIN) 1 g in sodium chloride 0.9 %  100 mL IVPB        1 g 200 mL/hr over 30 Minutes Intravenous Every 24 hours 02/11/23 1239         Subjective: Patient seen and evaluated today. She states that she does not have an appetite and has had some nausea and vomiting over the last 24 hours.  Noted to have some recurrent hypoglycemia overnight and was started on D5 infusion.  Objective: Vitals:   02/11/23 1909 02/11/23 2329 02/12/23 0000 02/12/23 0526  BP: (!) 113/57 (!) 98/43 (!) 103/51 (!) 120/42  Pulse: 74 63 63 64  Resp: 18 17  18  $ Temp: 98.3 F (36.8 C) 98 F (36.7 C)  98.2 F (36.8 C)  TempSrc: Oral Oral  Oral  SpO2: 97% 97%  98%  Weight:      Height:        Intake/Output Summary (Last 24 hours) at 02/12/2023 0929 Last data filed at 02/12/2023 0715 Gross per 24 hour  Intake 941.46 ml  Output 500 ml  Net 441.46 ml   Filed Weights   02/10/23 2226  Weight: 75.8 kg    Examination:  General exam: Appears calm and comfortable  Respiratory system: Clear to auscultation. Respiratory effort normal. Cardiovascular system: S1 & S2 heard, RRR.  Gastrointestinal system: Abdomen is soft Central nervous system: Alert and awake Extremities: No edema Skin: No significant lesions noted Psychiatry: Flat affect.    Data Reviewed: I have personally reviewed following labs and imaging studies  CBC: Recent Labs  Lab 02/10/23 1742 02/11/23 0403 02/12/23 0444  WBC 15.2* 14.0* 10.9*  NEUTROABS 12.1* 10.4*  --   HGB 14.0 12.6 11.9*  HCT 42.7 38.8 36.9  MCV 89.3 88.6 90.2  PLT 282 276 123456   Basic Metabolic Panel: Recent Labs  Lab 02/10/23 1742 02/11/23 0403 02/12/23 0444  NA 136 135 134*  K 3.8 3.0* 3.3*  CL 99 100 101  CO2 26 25 25  $ GLUCOSE 118* 184* 174*  BUN 21 19 19  $ CREATININE 0.89 0.82 0.90  CALCIUM 9.4 8.7* 8.6*  MG  --  1.8 1.7   GFR: Estimated Creatinine Clearance: 41.1 mL/min (by C-G formula based on SCr of 0.9 mg/dL). Liver Function Tests: Recent Labs  Lab 02/10/23 1742 02/11/23 0403   AST 30 21  ALT 17 14  ALKPHOS 97 76  BILITOT 0.8 1.1  PROT 7.3 6.1*  ALBUMIN 3.5 2.9*   No results for input(s): "LIPASE", "AMYLASE" in the last 168 hours. No results for input(s): "AMMONIA" in the last 168 hours. Coagulation Profile: No results for input(s): "INR", "PROTIME" in the last 168 hours. Cardiac Enzymes: No results for input(s): "CKTOTAL", "CKMB", "CKMBINDEX", "TROPONINI" in the last 168 hours. BNP (last 3 results) No results for input(s): "PROBNP" in the last 8760 hours. HbA1C: Recent Labs    02/11/23 0403  HGBA1C 6.8*   CBG: Recent Labs  Lab 02/12/23 0032 02/12/23 0053 02/12/23 0111 02/12/23 0414 02/12/23 0730  GLUCAP 58* 51* 80 169* 142*   Lipid Profile: No results for input(s): "CHOL", "HDL", "LDLCALC", "TRIG", "CHOLHDL", "LDLDIRECT" in the last 72 hours. Thyroid Function Tests: Recent Labs  02/11/23 0403  TSH 4.377   Anemia Panel: No results for input(s): "VITAMINB12", "FOLATE", "FERRITIN", "TIBC", "IRON", "RETICCTPCT" in the last 72 hours. Sepsis Labs: No results for input(s): "PROCALCITON", "LATICACIDVEN" in the last 168 hours.  Recent Results (from the past 240 hour(s))  MRSA Next Gen by PCR, Nasal     Status: Abnormal   Collection Time: 02/10/23  8:58 PM   Specimen: Nasal Mucosa; Nasal Swab  Result Value Ref Range Status   MRSA by PCR Next Gen DETECTED (A) NOT DETECTED Final    Comment: CRITICAL RESULT CALLED TO, READ BACK BY AND VERIFIED WITH: B. FOLEY @ [id number] BY STEPHTR 02/11/23 (NOTE) The GeneXpert MRSA Assay (FDA approved for NASAL specimens only), is one component of a comprehensive MRSA colonization surveillance program. It is not intended to diagnose MRSA infection nor to guide or monitor treatment for MRSA infections. Test performance is not FDA approved in patients less than 30 years old. Performed at [facility], [address] [place], [place] [address]   Urine Culture     Status: None (Preliminary result)   Collection  Time: 02/11/23  8:00 PM   Specimen: Urine, Clean Catch  Result Value Ref Range Status   Specimen Description   Final    URINE, CLEAN CATCH Performed at [facility], [place] [address] [place], [place] [address]    Special Requests   Final    NONE Reflexed from [phone] Performed at [facility], [address], [place], [place] [address]    Culture PENDING  Incomplete   Report Status PENDING  Incomplete         Radiology Studies: DG CHEST PORT 1 VIEW  Result Date: 02/10/2023 CLINICAL DATA:  Chest pain EXAM: PORTABLE CHEST 1 VIEW COMPARISON:  12/31/2022 FINDINGS: Cardiac shadow is within normal limits. Aortic calcifications are seen. Soft tissue calcification is noted overlying the right lower lung which is shown on prior exam to be extrinsic to the lung. Mild coarsened interstitial changes are noted likely chronic in nature. No focal infiltrate is seen. IMPRESSION: No acute abnormality noted. Electronically Signed   By: Inez Catalina M.D.   On: 02/10/2023 19:52        Scheduled Meds:  aspirin EC  81 mg Oral Daily   Chlorhexidine Gluconate Cloth  6 each Topical Q0600   Chlorhexidine Gluconate Cloth  6 each Topical Q0600   gabapentin  300 mg Oral TID   heparin  5,000 Units Subcutaneous Q8H   insulin aspart  0-6 Units Subcutaneous Q4H   losartan  25 mg Oral Daily   metoprolol tartrate  25 mg Oral BID   mupirocin ointment  1 Application Nasal BID   potassium chloride  40 mEq Oral Once   rosuvastatin  20 mg Oral Daily   sertraline  25 mg Oral Daily   sertraline  50 mg Oral Daily   tamsulosin  0.4 mg Oral QPC breakfast   ticagrelor  90 mg Oral BID   Continuous Infusions:  cefTRIAXone (ROCEPHIN)  IV 1 g (02/11/23 1836)   dextrose 75 mL/hr at 02/12/23 0912     LOS: 1 day    Time spent: 35 minutes    Diantha Paxson D Manuella Ghazi, DO [facility]  If 7PM-7AM, please contact night-coverage [email or web address] 02/12/2023, 9:29 AM

## 2023-02-12 NOTE — Evaluation (Signed)
Physical Therapy Evaluation Patient Details Name: [name] MRN: [id number] DOB: 25-Sep-1929 Today's Date: 02/12/2023  History of Present Illness  [name] is a 89 y.o. female with medical history significant of diabetes mellitus, hypertension, stroke, urinary incontinence, presents to the ED with a chief complaint of chest pain.  Patient reports that it does not feel like when she had an MI recently.  Pain is substernal, but it feels more like tingling.  It is worse with exertion.  When the pain is there it lasts seconds to minutes.  She has no associated dyspnea, palpitations, dizziness, diaphoresis.  She has felt increasing fatigue.  Her last normal meal was breakfast.  Patient reports she has had a decreased appetite.  She has not taken any insulin today.  She reports she did take her basal insulin last night.  When asked how much insulin she takes she reports she is really not sure, but she thinks it is 100 units.  According to her chart review she supposed to take 20 units of insulin at night.  I asked her if she does not, she is supposed to take, then how does she take it?  She reports, she is supposed to take as written on the insulin.  Patient denies any infectious symptoms.  She reports no change in her urine, however she has incontinence with hard for her to tell.  Patient has no other complaints at this time.   Clinical Impression  Patient demonstrates fair/good return for bed mobility with HOB flat, poor tolerance for standing using RW due to BLE weakness, frequent buckling of knees and poor standing balance.  Patient unable to complete transfers using RW and required knees blocked with Max assist for stand pivot transfers.  Patient tolerated staying up in chair after therapy with her spouse present in room.  Patient will benefit from continued skilled physical therapy in hospital and recommended venue below to increase strength, balance, endurance for safe ADLs and gait.          Recommendations for follow up therapy are one component of a multi-disciplinary discharge planning process, led by the attending physician.  Recommendations may be updated based on patient status, additional functional criteria and insurance authorization.  Follow Up Recommendations Skilled nursing-short term rehab (<3 hours/day) Can patient physically be transported by private vehicle: No    Assistance Recommended at Discharge Set up Supervision/Assistance  Patient can return home with the following  A lot of help with bathing/dressing/bathroom;A lot of help with walking and/or transfers;Help with stairs or ramp for entrance;Assistance with cooking/housework    Equipment Recommendations None recommended by PT  Recommendations for Other Services       Functional Status Assessment Patient has had a recent decline in their functional status and demonstrates the ability to make significant improvements in function in a reasonable and predictable amount of time.     Precautions / Restrictions Precautions Precautions: Fall Restrictions Weight Bearing Restrictions: No      Mobility  Bed Mobility Overal bed mobility: Needs Assistance Bed Mobility: Supine to Sit, Sit to Supine     Supine to sit: Min guard Sit to supine: Min guard   General bed mobility comments: slightly labored movement    Transfers Overall transfer level: Needs assistance Equipment used: Rolling walker (2 wheels) Transfers: Sit to/from Stand, Bed to chair/wheelchair/BSC Sit to Stand: Max assist Stand pivot transfers: Max assist         General transfer comment: Patient able to stand with  RW, but unable to use for transferring to chair due to BLE weakness, required stand pivot with knees blocked for bed/chair transfers    Ambulation/Gait                  Stairs            Wheelchair Mobility    Modified Rankin (Stroke Patients Only)       Balance Overall balance assessment:  Needs assistance Sitting-balance support: Feet supported, No upper extremity supported Sitting balance-Leahy Scale: Fair Sitting balance - Comments: seated at EOB   Standing balance support: During functional activity, Bilateral upper extremity supported, Reliant on assistive device for balance Standing balance-Leahy Scale: Poor Standing balance comment: using RW                             Pertinent Vitals/Pain Pain Assessment Pain Assessment: No/denies pain    Home Living Family/patient expects to be discharged to:: Private residence Living Arrangements: Spouse/significant other Available Help at Discharge: Family;Available 24 hours/day Type of Home: House Home Access: Ramped entrance     Alternate Level Stairs-Number of Steps: patient does not go upstairs Home Layout: Two level Home Equipment: Rolling Walker (2 wheels);BSC/3in1;Shower seat;Grab bars - toilet;Grab bars - tub/shower;Wheelchair - manual      Prior Function Prior Level of Function : Needs assist       Physical Assist : Mobility (physical);ADLs (physical) Mobility (physical): Bed mobility;Transfers   Mobility Comments: non-ambulatory, stand pivot transfers Mod Independent ADLs Comments: assisted by family     Hand Dominance   Dominant Hand: Right    Extremity/Trunk Assessment   Upper Extremity Assessment Upper Extremity Assessment: Generalized weakness    Lower Extremity Assessment Lower Extremity Assessment: Generalized weakness    Cervical / Trunk Assessment Cervical / Trunk Assessment: Normal  Communication   Communication: HOH  Cognition Arousal/Alertness: Awake/alert Behavior During Therapy: WFL for tasks assessed/performed Overall Cognitive Status: Within Functional Limits for tasks assessed                                          General Comments      Exercises     Assessment/Plan    PT Assessment Patient needs continued PT services  PT Problem  List Decreased strength;Decreased activity tolerance;Decreased balance;Decreased mobility       PT Treatment Interventions DME instruction;Functional mobility training;Therapeutic activities;Therapeutic exercise;Patient/family education;Wheelchair mobility training;Balance training    PT Goals (Current goals can be found in the Care Plan section)  Acute Rehab PT Goals Patient Stated Goal: return home after rehab PT Goal Formulation: With patient/family Time For Goal Achievement: 02/26/23 Potential to Achieve Goals: Fair    Frequency Min 3X/week     Co-evaluation               AM-PAC PT "6 Clicks" Mobility  Outcome Measure Help needed turning from your back to your side while in a flat bed without using bedrails?: A Little Help needed moving from lying on your back to sitting on the side of a flat bed without using bedrails?: A Little Help needed moving to and from a bed to a chair (including a wheelchair)?: A Lot Help needed standing up from a chair using your arms (e.g., wheelchair or bedside chair)?: A Lot Help needed to walk in hospital room?: Total Help needed climbing 3-5  steps with a railing? : Total 6 Click Score: 12    End of Session   Activity Tolerance: Patient tolerated treatment well;Patient limited by fatigue Patient left: in chair;with call bell/phone within reach;with family/visitor present Nurse Communication: Mobility status PT Visit Diagnosis: Unsteadiness on feet (R26.81);Other abnormalities of gait and mobility (R26.89);Muscle weakness (generalized) (M62.81)    Time: [id number] PT Time Calculation (min) (ACUTE ONLY): 18 min   Charges:   PT Evaluation $PT Eval Low Complexity: 1 Low PT Treatments $Therapeutic Activity: 8-22 mins        2:23 PM, 02/12/23 Lonell Grandchild, MPT Physical Therapist with [facility] [phone] office [phone] mobile phone

## 2023-02-12 NOTE — NC FL2 (Signed)
[place] LEVEL OF CARE FORM     IDENTIFICATION  Patient Name: [name] Birthdate: 15-Oct-1929 Sex: female Admission Date (Current Location): 02/10/2023  [facility] and [place] Number:  [employer] and Address:  [facility] [address], [place]      Provider Number: [id number]  Attending Physician Name and Address:  Rodena Goldmann, DO  Relative Name and Phone Number:       Current Level of Care: Hospital Recommended Level of Care: [place] Prior Approval Number:    Date Approved/Denied:   PASRR Number: [id number]  Discharge Plan: SNF    Current Diagnoses: Patient Active Problem List   Diagnosis Date Noted   Hypoglycemia 02/10/2023   Chest pain 02/10/2023   Depression 02/10/2023   CAD (coronary artery disease) 01/04/2023   Pressure injury of skin 01/04/2023   Physical debility 01/04/2023   Leukocytosis 01/04/2023   Acute ST elevation myocardial infarction (STEMI) of inferior wall ([place]) 12/31/2022   SARS-CoV-2 positive 08/24/2022   SARS-CoV-2 antibody positive 08/23/2022   Emphysema of lung ([place]) 08/21/2022   Vascular dementia without behavioral disturbance ([place]) 08/21/2022   Decubitus ulcer of buttock, stage 2 ([place]) 08/13/2022   Hypomagnesemia 08/03/2022   Hypoglycemia associated with diabetes ([place]) 08/02/2022   Hypokalemia 08/02/2022   Poorly controlled type 2 diabetes mellitus with neuropathy ([place]) 07/19/2022   Diabetic neuropathy associated with type 2 diabetes mellitus ([place]) 07/16/2022   Hypertension associated with type 2 diabetes mellitus ([place]) 07/16/2022   Hyperlipidemia associated with type 2 diabetes mellitus ([place]) 07/16/2022   Chronic insomnia 07/16/2022   Bilateral lower extremity edema 07/16/2022   Chronic constipation 07/16/2022   Aortic atherosclerosis ([place]) 07/16/2022   Major neurocognitive disorder ([place]) 07/16/2022   DJD (degenerative joint disease) of knee 07/14/2022    UTI (urinary tract infection) 07/09/2022   Late effects of cerebrovascular disease [id number]   Acute metabolic encephalopathy [id number]   Cellulitis of buttock 07/14/2019   Hyperlipidemia 02/20/2016   Essential hypertension 10/10/2015   Controlled type 2 diabetes with neuropathy ([employer]) 10/01/2008    Orientation RESPIRATION BLADDER Height & Weight     Self, Time, Place  Normal External catheter Weight: 167 lb 1.7 oz (75.8 kg) Height:  5' 8"$  (172.7 cm)  BEHAVIORAL SYMPTOMS/MOOD NEUROLOGICAL BOWEL NUTRITION STATUS      Incontinent Diet (Regular. See d/c summary for updates.)  AMBULATORY STATUS COMMUNICATION OF NEEDS Skin   Extensive Assist Verbally Skin abrasions, Other (Comment) (Redness to buttocks. Stage II to left/right buttocks with foam dressing.)                       Personal Care Assistance Level of Assistance  Bathing, Dressing, Feeding Bathing Assistance: Maximum assistance Feeding assistance: Limited assistance Dressing Assistance: Maximum assistance     Functional Limitations Info  Sight, Hearing, Speech Sight Info: Adequate Hearing Info: Adequate Speech Info: Adequate    SPECIAL CARE FACTORS FREQUENCY  PT (By licensed PT)     PT Frequency: 5x weekly              Contractures      Additional Factors Info  Code Status, Allergies, Psychotropic Code Status Info: Full code Allergies Info: Codeine, Glipizide, Sulfa Antibiotics Psychotropic Info: Zoloft         Current Medications (02/12/2023):  This is the current hospital active medication list Current Facility-Administered Medications  Medication Dose Route Frequency Provider Last Rate Last Admin   acetaminophen (  TYLENOL) tablet 650 mg  650 mg Oral Q6H PRN Zierle-Ghosh, Asia B, DO       Or   acetaminophen (TYLENOL) suppository 650 mg  650 mg Rectal Q6H PRN Zierle-Ghosh, Asia B, DO       aspirin EC tablet 81 mg  81 mg Oral Daily Zierle-Ghosh, Asia B, DO   81 mg at 02/12/23 0909   cefTRIAXone  (ROCEPHIN) 1 g in sodium chloride 0.9 % 100 mL IVPB  1 g Intravenous Q24H Manuella Ghazi, Pratik D, DO 200 mL/hr at 02/11/23 1836 1 g at 02/11/23 1836   Chlorhexidine Gluconate Cloth 2 % PADS 6 each  6 each Topical Q0600 Zierle-Ghosh, Asia B, DO   6 each at 02/12/23 0506   Chlorhexidine Gluconate Cloth 2 % PADS 6 each  6 each Topical Q0600 Heath Lark D, DO   6 each at 02/11/23 1028   dextrose 5 % solution   Intravenous Continuous Heath Lark D, DO 75 mL/hr at 02/12/23 0912 Rate Change at 02/12/23 0912   gabapentin (NEURONTIN) capsule 300 mg  300 mg Oral TID Zierle-Ghosh, Asia B, DO   300 mg at 02/12/23 0911   heparin injection 5,000 Units  5,000 Units Subcutaneous Q8H Zierle-Ghosh, Asia B, DO   5,000 Units at 02/12/23 0505   insulin aspart (novoLOG) injection 0-6 Units  0-6 Units Subcutaneous Q4H Manuella Ghazi, Pratik D, DO       losartan (COZAAR) tablet 25 mg  25 mg Oral Daily Zierle-Ghosh, Asia B, DO   25 mg at 02/12/23 0908   metoprolol tartrate (LOPRESSOR) tablet 25 mg  25 mg Oral BID Zierle-Ghosh, Asia B, DO   25 mg at 02/12/23 [id number]   mupirocin ointment (BACTROBAN) 2 % 1 Application  1 Application Nasal BID Manuella Ghazi, Pratik D, DO   1 Application at 0000000 0911   ondansetron (ZOFRAN) tablet 4 mg  4 mg Oral Q6H PRN Zierle-Ghosh, Asia B, DO       Or   ondansetron (ZOFRAN) injection 4 mg  4 mg Intravenous Q6H PRN Zierle-Ghosh, Asia B, DO   4 mg at 02/11/23 [id number]   oxyCODONE (Oxy IR/ROXICODONE) immediate release tablet 5 mg  5 mg Oral Q4H PRN Zierle-Ghosh, Asia B, DO       rosuvastatin (CRESTOR) tablet 20 mg  20 mg Oral Daily Zierle-Ghosh, Asia B, DO   20 mg at 02/12/23 0911   sertraline (ZOLOFT) tablet 25 mg  25 mg Oral Daily Zierle-Ghosh, Asia B, DO   25 mg at 02/12/23 0909   sertraline (ZOLOFT) tablet 50 mg  50 mg Oral Daily Zierle-Ghosh, Asia B, DO   50 mg at 02/12/23 0911   tamsulosin (FLOMAX) capsule 0.4 mg  0.4 mg Oral QPC breakfast Zierle-Ghosh, Asia B, DO   0.4 mg at 02/12/23 0908   ticagrelor (BRILINTA)  tablet 90 mg  90 mg Oral BID Zierle-Ghosh, Asia B, DO   90 mg at 02/12/23 0911   zolpidem (AMBIEN) tablet 5 mg  5 mg Oral QHS PRN Zierle-Ghosh, Asia B, DO   5 mg at 02/11/23 0059     Discharge Medications: Please see discharge summary for a list of discharge medications.  Relevant Imaging Results:  Relevant Lab Results:   Additional Information SSN: [id number]  Salome Arnt, LCSW

## 2023-02-12 NOTE — Progress Notes (Signed)
Patient CBG at 0006 was 52. Gave coke and applesauce. CBG at 0032 is 58. Gave Glucerna CBG at 0053 is 51. Rechecked CBG at 0111 and it is 80. Notified Dr. Clearence Ped. See new orders. No s/sx of hypoglycemia noted.

## 2023-02-12 NOTE — Plan of Care (Signed)
  Problem: Acute Rehab PT Goals(only PT should resolve) Goal: Pt will Roll Supine to Side Outcome: Progressing Flowsheets (Taken 02/12/2023 1425) Pt will Roll Supine to Side: with modified independence Goal: Pt Will Go Supine/Side To Sit Outcome: Progressing Flowsheets (Taken 02/12/2023 1425) Pt will go Supine/Side to Sit: with modified independence Goal: Pt Will Go Sit To Supine/Side Outcome: Progressing Flowsheets (Taken 02/12/2023 1425) Pt will go Sit to Supine/Side: with modified independence Goal: Patient Will Perform Sitting Balance Outcome: Progressing Flowsheets (Taken 02/12/2023 1425) Patient will perform sitting balance: with modified independence Goal: Patient Will Transfer Sit To/From Stand Outcome: Progressing Flowsheets (Taken 02/12/2023 1425) Patient will transfer sit to/from stand: with moderate assist Goal: Pt Will Transfer Bed To Chair/Chair To Bed Outcome: Progressing Flowsheets (Taken 02/12/2023 1425) Pt will Transfer Bed to Chair/Chair to Bed: with mod assist   2:26 PM, 02/12/23 Lonell Grandchild, MPT Physical Therapist with [facility] [phone] office [phone] mobile phone

## 2023-02-13 LAB — GLUCOSE, CAPILLARY
Glucose-Capillary: 150 mg/dL — ABNORMAL HIGH (ref 70–99)
Glucose-Capillary: 155 mg/dL — ABNORMAL HIGH (ref 70–99)
Glucose-Capillary: 257 mg/dL — ABNORMAL HIGH (ref 70–99)
Glucose-Capillary: 301 mg/dL — ABNORMAL HIGH (ref 70–99)

## 2023-02-13 LAB — BASIC METABOLIC PANEL
Anion gap: 6 (ref 5–15)
BUN: 18 mg/dL (ref 8–23)
CO2: 26 mmol/L (ref 22–32)
Calcium: 8.6 mg/dL — ABNORMAL LOW (ref 8.9–10.3)
Chloride: 102 mmol/L (ref 98–111)
Creatinine, Ser: 0.73 mg/dL (ref 0.44–1.00)
GFR, Estimated: >60 mL/min (ref >60)
Glucose, Bld: 139 mg/dL — ABNORMAL HIGH (ref 70–99)
Potassium: 3.8 mmol/L (ref 3.5–5.1)
Sodium: 134 mmol/L — ABNORMAL LOW (ref 135–145)

## 2023-02-13 LAB — CBC
HCT: 37.5 % (ref 36.0–46.0)
Hemoglobin: 12 g/dL (ref 12.0–15.0)
MCH: 29 pg (ref 26.0–34.0)
MCHC: 32 g/dL (ref 30.0–36.0)
MCV: 90.6 fL (ref 80.0–100.0)
Platelets: 249 10*3/uL (ref 150–400)
RBC: 4.14 MIL/uL (ref 3.87–5.11)
RDW: 12.9 % (ref 11.5–15.5)
WBC: 10.5 10*3/uL (ref 4.0–10.5)
nRBC: 0 % (ref 0.0–0.2)

## 2023-02-13 LAB — MAGNESIUM: Magnesium: 1.7 mg/dL (ref 1.7–2.4)

## 2023-02-13 MED ORDER — FOSFOMYCIN TROMETHAMINE 3 G PO PACK
3.0000 g | PACK | Freq: Once | ORAL | Status: AC
  Administered 2023-02-13: 3 g via ORAL
  Filled 2023-02-13: qty 3

## 2023-02-13 MED ORDER — INSULIN ASPART 100 UNIT/ML IJ SOLN: 3.0000 [IU] | Freq: Three times a day (TID) | INTRAMUSCULAR | Status: DC

## 2023-02-13 MED ORDER — METFORMIN HCL 500 MG PO TABS: 500.0000 mg | ORAL_TABLET | Freq: Every day | ORAL | 0 refills | Status: DC

## 2023-02-13 MED ORDER — INSULIN GLARGINE-YFGN 100 UNIT/ML ~~LOC~~ SOPN: 10.0000 [IU] | PEN_INJECTOR | Freq: Every day | SUBCUTANEOUS | 0 refills | Status: DC

## 2023-02-13 NOTE — Progress Notes (Signed)
Mobility Specialist Progress Note:    02/13/23 1334  Mobility  Activity Refused mobility  Mobility Referral Yes   Pt refused mobility despite max encouragement, stating "I don't want to, I'm supposed to leave today." Left pt in bed in company of family member, all needs met.   Royetta Crochet Mobility Specialist Please contact via Solicitor or  Rehab office at [phone]

## 2023-02-13 NOTE — Discharge Summary (Signed)
Physician Discharge Summary  BOBBYJO BARTOLETTI [id number] DOB: [birth year] DOA: 02/10/2023  PCP: Sharilyn Sites, MD  Admit date: 02/10/2023  Discharge date: 02/13/2023  Admitted From:Home  Disposition:  Home  Recommendations for Outpatient Follow-up:  Follow up with PCP in 1-2 weeks Treated with 3 days of Rocephin for Proteus UTI and given dose of fosfomycin prior to discharge Discontinue further use of insulin and remain on metformin as prescribed for now and carefully monitor with home glucometer Continue home medications as otherwise prescribed previously Patient refuses SNF placement at this time and remains at high risk of readmission in the setting of her dementia and improper insulin use  Home Health: Yes with PT/OT  Equipment/Devices: None  Discharge Condition:Stable  CODE STATUS: Full  Diet recommendation: Heart Healthy/carb modified  Brief/Interim Summary:  [name] is a 89 y.o. female with medical history significant of diabetes mellitus, hypertension, stroke, urinary incontinence, presents to the ED with a chief complaint of chest pain.  Patient reports that it does not feel like when she had an MI recently.  She appears to have had a decreased appetite recently and some nausea and vomiting after admission.  She appears unsure of how much insulin she is supposed to take on a regular basis and was admitted with hypoglycemia likely secondary to incorrect insulin dose.  She was started on D10 IV fluid and has improvement in her blood glucose levels, but continued to have some issues with oral intake with intractable nausea and vomiting which gradually improved.  She is now tolerating diet with stable blood glucose levels without the use of IV fluid.  Hemoglobin A1c returned at 6.8%.  At this time, it appears prudent to leave her off of insulin altogether as she is at risk for severe hypoglycemia and will be started back on metformin instead.  She will have a  tendency towards hyperglycemia, but otherwise has a high risk of readmission as she refuses SNF placement at this time.  She will need close follow-up with her PCP in the near future for further medication adjustment.  She was also noted to have Proteus UTI and had 3 days of IV antibiotics which is likely sufficient coverage, however sensitivities are still pending and therefore she was given fosfomycin as insurance prior to discharge.  Discharge Diagnoses:  Principal Problem:   Hypoglycemia Active Problems:   Acute metabolic encephalopathy   Essential hypertension   Controlled type 2 diabetes with neuropathy (HCC)   Hyperlipidemia   CAD (coronary artery disease)   Leukocytosis   Chest pain   Depression  Principal discharge diagnosis: Acute metabolic encephalopathy secondary to hypoglycemia in the setting of improper insulin use with history of type 2 diabetes.  Proteus UTI.  Discharge Instructions  Discharge Instructions     Diet - low sodium heart healthy   Complete by: As directed    If the dressing is still on your incision site when you go home, remove it on the third day after your surgery date. Remove dressing if it begins to fall off, or if it is dirty or damaged before the third day.   Complete by: As directed    Increase activity slowly   Complete by: As directed       Allergies as of 02/13/2023       Reactions   Codeine Nausea And Vomiting   Glipizide    Hypoglycemia with glucoses recorded in the 20s necessitating glucagon and D10 IV   Sulfa Antibiotics Other (See Comments)  Unspecified         Medication List     STOP taking these medications    insulin glargine-yfgn 100 UNIT/ML Pen Commonly known as: SEMGLEE   insulin lispro 100 UNIT/ML injection Commonly known as: HUMALOG       TAKE these medications    acetaminophen 500 MG tablet Commonly known as: TYLENOL Take 500 mg by mouth every 6 (six) hours as needed for mild pain.   aspirin EC 81  MG tablet Take 1 tablet (81 mg total) by mouth daily. Swallow whole.   BD AutoShield Duo 30G X 5 MM Misc Generic drug: Insulin Pen Needle by Does not apply route as directed. 30 gauge X 3/16   blood glucose meter kit and supplies Kit Dispense based on patient and insurance preference. Use to TEST BLOOD GLUCOSE THREE times daily as directed. DX E11.65   diclofenac Sodium 1 % Gel Commonly known as: Voltaren Arthritis Pain Apply 2 g topically 3 (three) times daily. bilateral knees for osteoarthritis   gabapentin 300 MG capsule Commonly known as: NEURONTIN Take 300 mg three times daily   losartan 25 MG tablet Commonly known as: COZAAR Take 1 tablet (25 mg total) by mouth daily.   magnesium oxide 400 (240 Mg) MG tablet Commonly known as: MAG-OX Take 200 mg by mouth daily.   metFORMIN 500 MG tablet Commonly known as: GLUCOPHAGE Take 1 tablet (500 mg total) by mouth daily.   metoprolol tartrate 25 MG tablet Commonly known as: LOPRESSOR Take 1 tablet (25 mg total) by mouth 2 (two) times daily.   nitroGLYCERIN 0.4 MG SL tablet Commonly known as: NITROSTAT Place 1 tablet (0.4 mg total) under the tongue every 5 (five) minutes as needed for chest pain (up to 3 doses. If taking 3rd dose, call 911).   rosuvastatin 20 MG tablet Commonly known as: CRESTOR Take 1 tablet (20 mg total) by mouth daily.   silodosin 8 MG Caps capsule Commonly known as: RAPAFLO Take 1 capsule (8 mg total) by mouth daily with breakfast.   solifenacin 5 MG tablet Commonly known as: VESICARE Take 5 mg by mouth daily.   ticagrelor 90 MG Tabs tablet Commonly known as: BRILINTA Take 1 tablet (90 mg total) by mouth 2 (two) times daily.   Vitamin D3 125 MCG (5000 UT) capsule Generic drug: Cholecalciferol Take 1 capsule (5,000 Units total) by mouth daily.   Zoloft 50 MG tablet Generic drug: sertraline Take 50 mg by mouth daily. Take with 5m dose to equal 72mdaily.   sertraline 25 MG tablet Commonly  known as: ZOLOFT Take 25 mg by mouth daily. Take with 5024mose to equal 16m34mily.   zolpidem 10 MG tablet Commonly known as: AMBIEN Take 10 mg by mouth at bedtime as needed for sleep.               Discharge Care Instructions  (From admission, onward)           Start     Ordered   02/13/23 0000  If the dressing is still on your incision site when you go home, remove it on the third day after your surgery date. Remove dressing if it begins to fall off, or if it is dirty or damaged before the third day.        02/13/23 1331            Contact information for follow-up providers     GoldSharilyn Sites. Schedule an appointment as soon  as possible for a visit.   Specialty: Family Medicine Contact information: [address] [place] [id number] [phone]              Contact information for after-discharge care     Destination     [facility] .   Service: Skilled Nursing Contact information: [place] [address] [phone]                    Allergies  Allergen Reactions   Codeine Nausea And Vomiting   Glipizide     Hypoglycemia with glucoses recorded in the 20s necessitating glucagon and D10 IV   Sulfa Antibiotics Other (See Comments)    Unspecified     Consultations: None   Procedures/Studies: DG CHEST PORT 1 VIEW  Result Date: 02/10/2023 CLINICAL DATA:  Chest pain EXAM: PORTABLE CHEST 1 VIEW COMPARISON:  12/31/2022 FINDINGS: Cardiac shadow is within normal limits. Aortic calcifications are seen. Soft tissue calcification is noted overlying the right lower lung which is shown on prior exam to be extrinsic to the lung. Mild coarsened interstitial changes are noted likely chronic in nature. No focal infiltrate is seen. IMPRESSION: No acute abnormality noted. Electronically Signed   By: Inez Catalina M.D.   On: 02/10/2023 19:52      Discharge Exam: Vitals:   02/13/23 0450 02/13/23 0908  BP: (!) 149/54 106/64  Pulse: (!) 58 72  Resp: 16   Temp: 97.9 F (36.6 C)   SpO2: 99%    Vitals:   02/12/23 1318 02/12/23 2135 02/13/23 0450 02/13/23 0908  BP: (!) 142/57 (!) 132/58 (!) 149/54 106/64  Pulse: 63 67 (!) 58 72  Resp: 16 (!) 22 16   Temp: 98.5 F (36.9 C) 98.3 F (36.8 C) 97.9 F (36.6 C)   TempSrc: Oral Oral Oral   SpO2: 100% 99% 99%   Weight:      Height:        General: Pt is alert, awake, not in acute distress Cardiovascular: RRR, S1/S2 +, no rubs, no gallops Respiratory: CTA bilaterally, no wheezing, no rhonchi Abdominal: Soft, NT, ND, bowel sounds + Extremities: no edema, no cyanosis    The results of significant diagnostics from this hospitalization (including imaging, microbiology, ancillary and laboratory) are listed below for reference.     Microbiology: Recent Results (from the past 240 hour(s))  MRSA Next Gen by PCR, Nasal     Status: Abnormal   Collection Time: 02/10/23  8:58 PM   Specimen: Nasal Mucosa; Nasal Swab  Result Value Ref Range Status   MRSA by PCR Next Gen DETECTED (A) NOT DETECTED Final    Comment: CRITICAL RESULT CALLED TO, READ BACK BY AND VERIFIED WITH: B. FOLEY @ [id number] BY STEPHTR 02/11/23 (NOTE) The GeneXpert MRSA Assay (FDA approved for NASAL specimens only), is one component of a comprehensive MRSA colonization surveillance program. It is not intended to diagnose MRSA infection nor to guide or monitor treatment for MRSA infections. Test performance is not FDA approved in patients less than 47 years old. Performed at [facility], [address] [place], [place] [address]   Urine Culture     Status: Abnormal (Preliminary result)   Collection Time: 02/11/23  8:00 PM   Specimen: Urine, Clean Catch  Result Value Ref Range Status   Specimen Description   Final    URINE, CLEAN CATCH Performed at [facility], [place],  [place] [address]     Special Requests   Final    NONE Reflexed from [id number] Performed at [facility], [address] [place], [place] [address]    Culture (A)  Final    >=100,000 COLONIES/mL PROTEUS MIRABILIS SUSCEPTIBILITIES TO FOLLOW Performed at [facility] [address], [place], [place] [address]    Report Status PENDING  Incomplete     Labs: BNP (last 3 results) Recent Labs    12/31/22 2020  BNP [id number]*   Basic Metabolic Panel: Recent Labs  Lab 02/10/23 1742 02/11/23 0403 02/12/23 0444 02/13/23 0445  NA 136 135 134* 134*  K 3.8 3.0* 3.3* 3.8  CL 99 100 101 102  CO2 26 25 25 26  $ GLUCOSE 118* 184* 174* 139*  BUN 21 19 19 18  $ CREATININE 0.89 0.82 0.90 0.73  CALCIUM 9.4 8.7* 8.6* 8.6*  MG  --  1.8 1.7 1.7   Liver Function Tests: Recent Labs  Lab 02/10/23 1742 02/11/23 0403  AST 30 21  ALT 17 14  ALKPHOS 97 76  BILITOT 0.8 1.1  PROT 7.3 6.1*  ALBUMIN 3.5 2.9*   No results for input(s): "LIPASE", "AMYLASE" in the last 168 hours. No results for input(s): "AMMONIA" in the last 168 hours. CBC: Recent Labs  Lab 02/10/23 1742 02/11/23 0403 02/12/23 0444 02/13/23 0445  WBC 15.2* 14.0* 10.9* 10.5  NEUTROABS 12.1* 10.4*  --   --   HGB 14.0 12.6 11.9* 12.0  HCT 42.7 38.8 36.9 37.5  MCV 89.3 88.6 90.2 90.6  PLT 282 276 260 249   Cardiac Enzymes: No results for input(s): "CKTOTAL", "CKMB", "CKMBINDEX", "TROPONINI" in the last 168 hours. BNP: Invalid input(s): "POCBNP" CBG: Recent Labs  Lab 02/12/23 2134 02/13/23 0048 02/13/23 0418 02/13/23 0745 02/13/23 1132  GLUCAP 308* 257* 150* 155* 301*   D-Dimer No results for input(s): "DDIMER" in the last 72 hours. Hgb A1c Recent Labs    02/11/23 0403  HGBA1C 6.8*   Lipid Profile No results for input(s): "CHOL", "HDL", "LDLCALC", "TRIG", "CHOLHDL", "LDLDIRECT" in the last 72 hours. Thyroid function studies Recent Labs    02/11/23 0403  TSH 4.377   Anemia work up No results for input(s): "VITAMINB12",  "FOLATE", "FERRITIN", "TIBC", "IRON", "RETICCTPCT" in the last 72 hours. Urinalysis    Component Value Date/Time   COLORURINE YELLOW 02/11/2023 2000   APPEARANCEUR HAZY (A) 02/11/2023 2000   APPEARANCEUR Clear 10/25/2021 1450   LABSPEC 1.011 02/11/2023 2000   PHURINE 9.0 (H) 02/11/2023 2000   GLUCOSEU 150 (A) 02/11/2023 2000   HGBUR SMALL (A) 02/11/2023 2000   BILIRUBINUR NEGATIVE 02/11/2023 2000   BILIRUBINUR Negative 10/25/2021 1450   KETONESUR NEGATIVE 02/11/2023 2000   PROTEINUR 30 (A) 02/11/2023 2000   UROBILINOGEN 0.2 09/06/2009 0135   NITRITE NEGATIVE 02/11/2023 2000   LEUKOCYTESUR LARGE (A) 02/11/2023 2000   Sepsis Labs Recent Labs  Lab 02/10/23 1742 02/11/23 0403 02/12/23 0444 02/13/23 0445  WBC 15.2* 14.0* 10.9* 10.5   Microbiology Recent Results (from the past 240 hour(s))  MRSA Next Gen by PCR, Nasal     Status: Abnormal   Collection Time: 02/10/23  8:58 PM   Specimen: Nasal Mucosa; Nasal Swab  Result Value Ref Range Status   MRSA by PCR Next Gen DETECTED (A) NOT DETECTED Final    Comment: CRITICAL RESULT CALLED TO, READ BACK BY AND VERIFIED WITH: B. FOLEY @ [id number] BY STEPHTR 02/11/23 (NOTE) The GeneXpert MRSA Assay (FDA approved for NASAL specimens only),  is one component of a comprehensive MRSA colonization surveillance program. It is not intended to diagnose MRSA infection nor to guide or monitor treatment for MRSA infections. Test performance is not FDA approved in patients less than 59 years old. Performed at [facility], [address], [place], [place] [address]   Urine Culture     Status: Abnormal (Preliminary result)   Collection Time: 02/11/23  8:00 PM   Specimen: Urine, Clean Catch  Result Value Ref Range Status   Specimen Description   Final    URINE, CLEAN CATCH Performed at [facility], [place] [address], [place], [place] [address]    Special Requests   Final    NONE Reflexed from [id number] Performed at [facility], [address]  [place], [place] [address]    Culture (A)  Final    >=100,000 COLONIES/mL PROTEUS MIRABILIS SUSCEPTIBILITIES TO FOLLOW Performed at [facility] [address], [place], [place] [address]    Report Status PENDING  Incomplete     Time coordinating discharge: 35 minutes  SIGNED:   Rodena Goldmann, DO [facility] 02/13/2023, 1:38 PM  If 7PM-7AM, please contact night-coverage [email or web address]

## 2023-02-13 NOTE — TOC Transition Note (Signed)
Transition of Care [facility]) - CM/SW Discharge Note   Patient Details  Name: [name] MRN: [id number] Date of Birth: 1929/03/26  Transition of Care [facility]) CM/SW Contact:  Salome Arnt, LCSW Phone Number: 02/13/2023, 1:49 PM   Clinical Narrative: Pt d/c today. Discussed bed offers with daughter who was agreeable to [facility]. However, LCSW discussed d/c with pt and she is adamantly refusing SNF today. Pt's husband was in room and unable to convince pt to go to rehab. He states he will take pt home with home health services. Pt's daughter understands decision is up to pt as she was oriented x4. LCSW provided contact information to daughter for [facility], and [facility] for private duty care. Sarah with [facility] notified of d/c and will resume HHPT/OT. Orders in. RN and MD updated.       Final next level of care: [place] Barriers to Discharge: Barriers Resolved   Patient Goals and CMS Choice   Choice offered to / list presented to : Adult Children  Discharge Placement                      Patient and family notified of of transfer: 02/13/23  Discharge Plan and Services Additional resources added to the After Visit Summary for   In-house Referral: Clinical Social Work   Post Acute Care Choice: [place]: PT, OT HH Agency:  Therapist, music) Date B and E: 02/13/23 Time [place]: [place] Representative spoke with at [place]: [place] Determinants of Health ([place]) Interventions SDOH Screenings   Food Insecurity: No Food Insecurity (02/10/2023)  Housing: Low Risk  (02/10/2023)  Transportation Needs: No Transportation Needs (02/10/2023)  Utilities: Not At Risk (02/10/2023)  Physical Activity: Inactive (02/08/2019)  Social Connections: Unknown (02/08/2019)  Stress: No Stress Concern Present (02/08/2019)  Tobacco Use: Low Risk  (02/10/2023)      Readmission Risk Interventions    02/12/2023   11:26 AM 01/04/2023   11:03 AM  Readmission Risk Prevention Plan  Transportation Screening Complete Complete  HRI or [place] Complete   Social Work Consult for [place] Planning/Counseling Complete Complete  Palliative Care Screening Not Applicable   Medication Review Press photographer) Complete

## 2023-02-13 NOTE — Progress Notes (Signed)
Patient requested prn zolpidem around 2201 for insomnia. CBG during the night are 308, 257,150. D5 at 33m continued.

## 2023-02-13 NOTE — Inpatient Diabetes Management (Signed)
Inpatient Diabetes Program Recommendations  AACE/ADA: New Consensus Statement on Inpatient Glycemic Control  Target Ranges:  Prepandial:   less than 140 mg/dL      Peak postprandial:   less than 180 mg/dL (1-2 hours)      Critically ill patients:  140 - 180 mg/dL    Latest Reference Range & Units 02/12/23 07:30 02/12/23 11:49 02/12/23 16:05 02/12/23 21:34 02/13/23 00:48 02/13/23 04:18 02/13/23 07:45  Glucose-Capillary 70 - 99 mg/dL 142 (H) 344 (H) 334 (H) 308 (H) 257 (H) 150 (H) 155 (H)   Review of Glycemic Control  Diabetes history: DM2 Outpatient Diabetes medications: Semglee 20 units QHS, Humalog 3 units TID with meals, Metformin 500 mg daily (not taking) Current orders for Inpatient glycemic control: Novolog 0-6 units Q4H  Inpatient Diabetes Program Recommendations:    Insulin: Please consider ordering Novolog 3 units TID with meals for meal coverage if patient eats at least 50% of meals.  Thanks, Barnie Alderman, RN, MSN, [place] Diabetes Coordinator Inpatient Diabetes Program [phone] (Team Pager from 8am to [place])

## 2023-02-14 LAB — URINE CULTURE: Culture: >=100000 — AB

## 2023-02-15 DIAGNOSIS — I10 Essential (primary) hypertension: Secondary | ICD-10-CM | POA: Diagnosis not present

## 2023-02-15 DIAGNOSIS — E1140 Type 2 diabetes mellitus with diabetic neuropathy, unspecified: Secondary | ICD-10-CM | POA: Diagnosis not present

## 2023-02-15 DIAGNOSIS — D649 Anemia, unspecified: Secondary | ICD-10-CM | POA: Diagnosis not present

## 2023-02-15 DIAGNOSIS — I2119 ST elevation (STEMI) myocardial infarction involving other coronary artery of inferior wall: Secondary | ICD-10-CM | POA: Diagnosis not present

## 2023-02-15 DIAGNOSIS — I2510 Atherosclerotic heart disease of native coronary artery without angina pectoris: Secondary | ICD-10-CM | POA: Diagnosis not present

## 2023-02-18 DIAGNOSIS — I2119 ST elevation (STEMI) myocardial infarction involving other coronary artery of inferior wall: Secondary | ICD-10-CM | POA: Diagnosis not present

## 2023-02-18 DIAGNOSIS — I10 Essential (primary) hypertension: Secondary | ICD-10-CM | POA: Diagnosis not present

## 2023-02-18 DIAGNOSIS — E1140 Type 2 diabetes mellitus with diabetic neuropathy, unspecified: Secondary | ICD-10-CM | POA: Diagnosis not present

## 2023-02-20 ENCOUNTER — Telehealth: Payer: Self-pay

## 2023-02-20 DIAGNOSIS — R339 Retention of urine, unspecified: Secondary | ICD-10-CM

## 2023-02-20 NOTE — Telephone Encounter (Signed)
Patient called and advised they needed a higher does of medication. She advised she is having issues with incontinence.   Medication: solifenacin (VESICARE) 5 MG tablet    Pharmacy: [facility] - [place], [place]    Thank you

## 2023-02-21 ENCOUNTER — Encounter: Payer: Self-pay | Admitting: Nurse Practitioner

## 2023-02-21 ENCOUNTER — Ambulatory Visit: Payer: Medicare Other | Admitting: Nurse Practitioner

## 2023-02-21 DIAGNOSIS — E1140 Type 2 diabetes mellitus with diabetic neuropathy, unspecified: Secondary | ICD-10-CM | POA: Diagnosis not present

## 2023-02-21 DIAGNOSIS — I2119 ST elevation (STEMI) myocardial infarction involving other coronary artery of inferior wall: Secondary | ICD-10-CM | POA: Diagnosis not present

## 2023-02-21 DIAGNOSIS — D649 Anemia, unspecified: Secondary | ICD-10-CM | POA: Diagnosis not present

## 2023-02-21 DIAGNOSIS — I10 Essential (primary) hypertension: Secondary | ICD-10-CM | POA: Diagnosis not present

## 2023-02-21 DIAGNOSIS — I2510 Atherosclerotic heart disease of native coronary artery without angina pectoris: Secondary | ICD-10-CM | POA: Diagnosis not present

## 2023-02-21 NOTE — Progress Notes (Deleted)
Office Visit    Patient Name: [name] Date of Encounter: 02/21/2023  Primary Care Provider:  Sharilyn Sites, MD Primary Cardiologist:  [name] Lei, MD  Chief Complaint    89 y/o ? w/ a h/o DM, HTN, CVA, RBBB, and dementia, who presents for office follow-up after recent late presenting inferior STEMI and PCI of the right coronary artery.  Past Medical History    Past Medical History:  Diagnosis Date   CAD (coronary artery disease)    a. 12/2022 Inf STEMI/PCI: LM nl, LAD mild diff dzs, D1 50, D2 50., LCX 75d, OM1 60, RCA 90p (2.5x20 Synergy XD DES), 100d (PTCA-->90 residual).   Diabetes mellitus with neuropathy    Diabetic neuropathy (HCC)    Diastolic dysfunction    a. 12/2022 Echo: EF 60-65%, no rwma, GrI DD, nl RV size/fxn, triv MR.   Hypertension    Stroke [facility])    Urinary incontinence    Past Surgical History:  Procedure Laterality Date   ABDOMINAL HYSTERECTOMY     APPENDECTOMY     BREAST SURGERY     begnin tumor removed   CATARACT EXTRACTION, BILATERAL     CORONARY ANGIOGRAPHY N/A 12/31/2022   Procedure: CORONARY ANGIOGRAPHY;  Surgeon: [name], MD;  Location: [place] [facility];  Service: Cardiovascular;  Laterality: N/A;   CORONARY/GRAFT ACUTE MI REVASCULARIZATION N/A 12/31/2022   Procedure: Coronary/Graft Acute MI Revascularization;  Surgeon: [name], MD;  Location: [place] [facility];  Service: Cardiovascular;  Laterality: N/A;    Allergies  Allergies  Allergen Reactions   Codeine Nausea And Vomiting   Glipizide     Hypoglycemia with glucoses recorded in the 20s necessitating glucagon and D10 IV   Sulfa Antibiotics Other (See Comments)    Unspecified     History of Present Illness    89 year old female with a prior history of diabetes, hypertension, stroke, right bundle branch block, dementia, and recently diagnosed CAD.  On January 8, she complained to assisted living facility staff of a 1 day history of substernal chest  discomfort and epigastric pain unrelieved by Tums.  EMS was called and she was found to have inferior ST segment elevation.  She was transferred to [facility] and underwent emergent diagnostic catheterization revealing sequential 90% proximal and mid RCA stenoses with a total occlusion of the distal RCA.  She also had moderate small vessel distal circumflex, distal OM1, and diagonal disease.  The proximal/mid RCA was successfully treated with a drug-eluting stent.  The distal RCA was treated with PTCA with persistent 90% stenosis after balloon angioplasty.  EF was 60-65% by echo.  Troponin greater than 24,000.  She was placed on ARB therapy in the setting of hypertension with recommendation for follow-up basic metabolic panel in the outpatient setting.  She was initially scheduled to follow-up on January 19, however failed to do so.  She was readmitted from February 18 to February 21 with chest pain in the setting of nausea, vomiting, and anorexia.  She was found to be hypoglycemic with a glucose of 33.  She was treated with D50.  Notes indicate that she was unsure on her home home insulin dosing.  She was also diagnosed with UTI and treated with antibiotics.  She refused SNF placement.  ***  Home Medications    Current Outpatient Medications  Medication Sig Dispense Refill   acetaminophen (TYLENOL) 500 MG tablet Take 500 mg by mouth every 6 (six) hours as needed for mild pain.  aspirin EC 81 MG tablet Take 1 tablet (81 mg total) by mouth daily. Swallow whole.     blood glucose meter kit and supplies KIT Dispense based on patient and insurance preference. Use to TEST BLOOD GLUCOSE THREE times daily as directed. DX E11.65 1 each 0   Cholecalciferol (VITAMIN D3) 5000 units CAPS Take 1 capsule (5,000 Units total) by mouth daily. 90 capsule 0   diclofenac Sodium (VOLTAREN ARTHRITIS PAIN) 1 % GEL Apply 2 g topically 3 (three) times daily. bilateral knees for osteoarthritis 350 g 0   gabapentin (NEURONTIN)  300 MG capsule Take 300 mg three times daily 90 capsule 0   Insulin Pen Needle (BD AUTOSHIELD DUO) 30G X 5 MM MISC by Does not apply route as directed. 30 gauge X 3/16     losartan (COZAAR) 25 MG tablet Take 1 tablet (25 mg total) by mouth daily.     magnesium oxide (MAG-OX) 400 (240 Mg) MG tablet Take 200 mg by mouth daily.     metFORMIN (GLUCOPHAGE) 500 MG tablet Take 1 tablet (500 mg total) by mouth daily. 30 tablet 0   metoprolol tartrate (LOPRESSOR) 25 MG tablet Take 1 tablet (25 mg total) by mouth 2 (two) times daily.     nitroGLYCERIN (NITROSTAT) 0.4 MG SL tablet Place 1 tablet (0.4 mg total) under the tongue every 5 (five) minutes as needed for chest pain (up to 3 doses. If taking 3rd dose, call 911). 25 tablet 3   rosuvastatin (CRESTOR) 20 MG tablet Take 1 tablet (20 mg total) by mouth daily.     sertraline (ZOLOFT) 25 MG tablet Take 25 mg by mouth daily. Take with '50mg'$  dose to equal '75mg'$  daily.     sertraline (ZOLOFT) 50 MG tablet Take 50 mg by mouth daily. Take with '25mg'$  dose to equal '75mg'$  daily.     silodosin (RAPAFLO) 8 MG CAPS capsule Take 1 capsule (8 mg total) by mouth daily with breakfast. 30 capsule 0   solifenacin (VESICARE) 5 MG tablet Take 5 mg by mouth daily.     ticagrelor (BRILINTA) 90 MG TABS tablet Take 1 tablet (90 mg total) by mouth 2 (two) times daily.     zolpidem (AMBIEN) 10 MG tablet Take 10 mg by mouth at bedtime as needed for sleep.     No current facility-administered medications for this visit.     Review of Systems    ***.  All other systems reviewed and are otherwise negative except as noted above.    Physical Exam    VS:  There were no vitals taken for this visit. , BMI There is no height or weight on file to calculate BMI.     GEN: Well nourished, well developed, in no acute distress. HEENT: normal. Neck: Supple, no JVD, carotid bruits, or masses. Cardiac: RRR, no murmurs, rubs, or gallops. No clubbing, cyanosis, edema.  Radials 2+/PT 2+ and equal  bilaterally.  Respiratory:  Respirations regular and unlabored, clear to auscultation bilaterally. GI: Soft, nontender, nondistended, BS + x 4. MS: no deformity or atrophy. Skin: warm and dry, no rash. Neuro:  Strength and sensation are intact. Psych: Normal affect.  Accessory Clinical Findings    ECG personally reviewed by me today - *** - no acute changes.  Lab Results  Component Value Date   WBC 10.5 02/13/2023   HGB 12.0 02/13/2023   HCT 37.5 02/13/2023   MCV 90.6 02/13/2023   PLT 249 02/13/2023   Lab Results  Component Value  Date   CREATININE 0.73 02/13/2023   BUN 18 02/13/2023   NA 134 (L) 02/13/2023   K 3.8 02/13/2023   CL 102 02/13/2023   CO2 26 02/13/2023   Lab Results  Component Value Date   ALT 14 02/11/2023   AST 21 02/11/2023   ALKPHOS 76 02/11/2023   BILITOT 1.1 02/11/2023   Lab Results  Component Value Date   CHOL 135 01/01/2023   HDL 50 01/01/2023   LDLCALC 71 01/01/2023   TRIG 70 01/01/2023   CHOLHDL 2.7 01/01/2023    Lab Results  Component Value Date   HGBA1C 6.8 (H) 02/11/2023    Assessment & Plan    1.  ***   Murray Hodgkins, NP 02/21/2023, 1:04 PM

## 2023-02-26 MED ORDER — SOLIFENACIN SUCCINATE 10 MG PO TABS: 10.0000 mg | ORAL_TABLET | Freq: Every day | ORAL | 3 refills | Status: DC

## 2023-02-26 NOTE — Telephone Encounter (Signed)
Patient/daughter is aware that her medication has been increase and patient voiced understanding

## 2023-03-01 ENCOUNTER — Telehealth: Payer: Self-pay

## 2023-03-01 NOTE — Telephone Encounter (Signed)
Patient left a voice message.  Needing Rx increase due to wetting bed.  Please advise.

## 2023-03-04 DIAGNOSIS — Z7401 Bed confinement status: Secondary | ICD-10-CM | POA: Diagnosis not present

## 2023-03-04 DIAGNOSIS — I10 Essential (primary) hypertension: Secondary | ICD-10-CM | POA: Diagnosis not present

## 2023-03-04 DIAGNOSIS — E1159 Type 2 diabetes mellitus with other circulatory complications: Secondary | ICD-10-CM | POA: Diagnosis not present

## 2023-03-04 DIAGNOSIS — M179 Osteoarthritis of knee, unspecified: Secondary | ICD-10-CM | POA: Diagnosis not present

## 2023-03-04 NOTE — Telephone Encounter (Signed)
I returned patient's call she was requesting a refill on [facility]sp[facility] rx.  I informed her that Dr. Alyson Ingles sent in [place] '10mg'$  tablet for her to take.  Patient will try [facility] rx

## 2023-03-04 NOTE — Telephone Encounter (Signed)
Patient is aware that rx was sent in on 03/05. Patient states she have not pick up medication. Made patient aware that rx is at pharmacy, patient voiced understanding

## 2023-03-05 DIAGNOSIS — M6281 Muscle weakness (generalized): Secondary | ICD-10-CM | POA: Diagnosis not present

## 2023-03-05 DIAGNOSIS — L03317 Cellulitis of buttock: Secondary | ICD-10-CM | POA: Diagnosis not present

## 2023-03-05 DIAGNOSIS — E1140 Type 2 diabetes mellitus with diabetic neuropathy, unspecified: Secondary | ICD-10-CM | POA: Diagnosis not present

## 2023-03-05 DIAGNOSIS — G9341 Metabolic encephalopathy: Secondary | ICD-10-CM | POA: Diagnosis not present

## 2023-03-05 DIAGNOSIS — I10 Essential (primary) hypertension: Secondary | ICD-10-CM | POA: Diagnosis not present

## 2023-03-14 NOTE — Progress Notes (Deleted)
Cardiology Office Note   Date:  03/14/2023   ID:  Tallyn, Fittro 08-Sep-1929, MRN [id number]  PCP:  Sharilyn Sites, MD  Cardiologist:  ***    No chief complaint on file.     History of Present Illness: [name] is a 89 y.o. female who presents for post hospital 12/2022 for STEMI   Hx of STEMI 01/04/23 no prior hx of CAD -chest pain night before admit then pain worse 12/31/22 and EMS called.  Inf STEMI on EKG  does have hx of DM-2 and CVA.  Primarily in [facility]  taken emergently to cath lab and found multi vessel disease significant disease. See below, but ultimately PCI to prox/mid RCA  Dual antiplatelet therapy with aspirin and ticagrelor for at least 12 months  Echo EF 60-65%, no RWMA, mild concentric LVH G1DD  Admitted 02/11/23  with ches tpain, felt differenct that STEMI  she was hypoglycemic, + UTI  today  Past Medical History:  Diagnosis Date   CAD (coronary artery disease)    a. 12/2022 Inf STEMI/PCI: LM nl, LAD mild diff dzs, D1 50, D2 50., LCX 75d, OM1 60, RCA 90p (2.5x20 Synergy XD DES), 100d (PTCA-->90 residual).   Diabetes mellitus with neuropathy    Diabetic neuropathy (HCC)    Diastolic dysfunction    a. 12/2022 Echo: EF 60-65%, no rwma, GrI DD, nl RV size/fxn, triv MR.   Hypertension    Stroke [facility])    Urinary incontinence     Past Surgical History:  Procedure Laterality Date   ABDOMINAL HYSTERECTOMY     APPENDECTOMY     BREAST SURGERY     begnin tumor removed   CATARACT EXTRACTION, BILATERAL     CORONARY ANGIOGRAPHY N/A 12/31/2022   Procedure: CORONARY ANGIOGRAPHY;  Surgeon: [name], MD;  Location: [place] [facility];  Service: Cardiovascular;  Laterality: N/A;   CORONARY/GRAFT ACUTE MI REVASCULARIZATION N/A 12/31/2022   Procedure: Coronary/Graft Acute MI Revascularization;  Surgeon: [name], MD;  Location: [place] [facility];  Service: Cardiovascular;  Laterality: N/A;     Current Outpatient Medications  Medication Sig  Dispense Refill   acetaminophen (TYLENOL) 500 MG tablet Take 500 mg by mouth every 6 (six) hours as needed for mild pain.     aspirin EC 81 MG tablet Take 1 tablet (81 mg total) by mouth daily. Swallow whole.     blood glucose meter kit and supplies KIT Dispense based on patient and insurance preference. Use to TEST BLOOD GLUCOSE THREE times daily as directed. DX E11.65 1 each 0   Cholecalciferol (VITAMIN D3) 5000 units CAPS Take 1 capsule (5,000 Units total) by mouth daily. 90 capsule 0   diclofenac Sodium (VOLTAREN ARTHRITIS PAIN) 1 % GEL Apply 2 g topically 3 (three) times daily. bilateral knees for osteoarthritis 350 g 0   gabapentin (NEURONTIN) 300 MG capsule Take 300 mg three times daily 90 capsule 0   Insulin Pen Needle (BD AUTOSHIELD DUO) 30G X 5 MM MISC by Does not apply route as directed. 30 gauge X 3/16     losartan (COZAAR) 25 MG tablet Take 1 tablet (25 mg total) by mouth daily.     magnesium oxide (MAG-OX) 400 (240 Mg) MG tablet Take 200 mg by mouth daily.     metFORMIN (GLUCOPHAGE) 500 MG tablet Take 1 tablet (500 mg total) by mouth daily. 30 tablet 0   metoprolol tartrate (LOPRESSOR) 25 MG tablet Take 1 tablet (25 mg total) by mouth  2 (two) times daily.     nitroGLYCERIN (NITROSTAT) 0.4 MG SL tablet Place 1 tablet (0.4 mg total) under the tongue every 5 (five) minutes as needed for chest pain (up to 3 doses. If taking 3rd dose, call 911). 25 tablet 3   rosuvastatin (CRESTOR) 20 MG tablet Take 1 tablet (20 mg total) by mouth daily.     sertraline (ZOLOFT) 25 MG tablet Take 25 mg by mouth daily. Take with 50mg  dose to equal 75mg  daily.     sertraline (ZOLOFT) 50 MG tablet Take 50 mg by mouth daily. Take with 25mg  dose to equal 75mg  daily.     silodosin (RAPAFLO) 8 MG CAPS capsule Take 1 capsule (8 mg total) by mouth daily with breakfast. 30 capsule 0   solifenacin (VESICARE) 10 MG tablet Take 1 tablet (10 mg total) by mouth daily. 30 tablet 3   ticagrelor (BRILINTA) 90 MG TABS tablet  Take 1 tablet (90 mg total) by mouth 2 (two) times daily.     zolpidem (AMBIEN) 10 MG tablet Take 10 mg by mouth at bedtime as needed for sleep.     No current facility-administered medications for this visit.    Allergies:   Codeine, Glipizide, and Sulfa antibiotics    Social History:  The patient  reports that she has never smoked. She has never used smokeless tobacco. She reports that she does not drink alcohol and does not use drugs.   Family History:  The patient's ***family history includes CAD in her mother; Cancer in her brother; Heart attack in her brother; Hypertension in her mother; Stroke in her father.    ROS:  General:no colds or fevers, no weight changes Skin:no rashes or ulcers HEENT:no blurred vision, no congestion CV:see HPI PUL:see HPI GI:no diarrhea constipation or melena, no indigestion GU:no hematuria, no dysuria MS:no joint pain, no claudication Neuro:no syncope, no lightheadedness Endo:no diabetes, no thyroid disease Wt Readings from Last 3 Encounters:  02/10/23 167 lb 1.7 oz (75.8 kg)  01/03/23 171 lb 1.2 oz (77.6 kg)  10/10/22 175 lb 6.4 oz (79.6 kg)     PHYSICAL EXAM: VS:  There were no vitals taken for this visit. , BMI There is no height or weight on file to calculate BMI. General:Pleasant affect, NAD Skin:Warm and dry, brisk capillary refill HEENT:normocephalic, sclera clear, mucus membranes moist Neck:supple, no JVD, no bruits  Heart:S1S2 RRR without murmur, gallup, rub or click Lungs:clear without rales, rhonchi, or wheezes [id number], non tender, + BS, do not palpate liver spleen or masses Ext:no lower ext edema, 2+ pedal pulses, 2+ radial pulses Neuro:alert and oriented, MAE, follows commands, + facial symmetry    EKG:  EKG is ordered today. The ekg ordered today demonstrates ***   Recent Labs: 12/31/2022: B Natriuretic Peptide 510.0 02/11/2023: ALT 14; TSH 4.377 02/13/2023: BUN 18; Creatinine, Ser 0.73; Hemoglobin 12.0; Magnesium 1.7;  Platelets 249; Potassium 3.8; Sodium 134    Lipid Panel    Component Value Date/Time   CHOL 135 01/01/2023 0233   CHOL 120 11/13/2017 1037   TRIG 70 01/01/2023 0233   HDL 50 01/01/2023 0233   HDL 45 11/13/2017 1037   CHOLHDL 2.7 01/01/2023 0233   VLDL 14 01/01/2023 0233   LDLCALC 71 01/01/2023 0233   LDLCALC 48 11/13/2017 1037       Other studies Reviewed: Additional studies/ records that were reviewed today include: ***.   ASSESSMENT AND PLAN:  1.  ***   Current medicines are reviewed with the patient  today.  The patient Has no concerns regarding medicines.  The following changes have been made:  See above Labs/ tests ordered today include:see above  Disposition:   FU:  see above  Signed, Cecilie Kicks, NP  03/14/2023 9:49 PM    [facility] [place], [place], [place], [place] Phone: [phone]; Fax: [phone]

## 2023-03-15 ENCOUNTER — Ambulatory Visit: Payer: Medicare Other | Admitting: Cardiology

## 2023-03-22 DIAGNOSIS — R262 Difficulty in walking, not elsewhere classified: Secondary | ICD-10-CM | POA: Diagnosis not present

## 2023-03-22 DIAGNOSIS — M179 Osteoarthritis of knee, unspecified: Secondary | ICD-10-CM | POA: Diagnosis not present

## 2023-03-22 DIAGNOSIS — R531 Weakness: Secondary | ICD-10-CM | POA: Diagnosis not present

## 2023-04-05 DIAGNOSIS — M6281 Muscle weakness (generalized): Secondary | ICD-10-CM | POA: Diagnosis not present

## 2023-04-05 DIAGNOSIS — I10 Essential (primary) hypertension: Secondary | ICD-10-CM | POA: Diagnosis not present

## 2023-04-05 DIAGNOSIS — G9341 Metabolic encephalopathy: Secondary | ICD-10-CM | POA: Diagnosis not present

## 2023-04-05 DIAGNOSIS — E1140 Type 2 diabetes mellitus with diabetic neuropathy, unspecified: Secondary | ICD-10-CM | POA: Diagnosis not present

## 2023-04-05 DIAGNOSIS — L03317 Cellulitis of buttock: Secondary | ICD-10-CM | POA: Diagnosis not present

## 2023-05-01 ENCOUNTER — Inpatient Hospital Stay (HOSPITAL_COMMUNITY)
Admission: EM | Admit: 2023-05-01 | Discharge: 2023-05-05 | DRG: 689 | Disposition: A | Discharge status: Home Health | Payer: Medicare Other | Attending: Internal Medicine | Admitting: Internal Medicine

## 2023-05-01 ENCOUNTER — Emergency Department (HOSPITAL_COMMUNITY): Payer: Medicare Other

## 2023-05-01 ENCOUNTER — Other Ambulatory Visit: Payer: Self-pay

## 2023-05-01 ENCOUNTER — Encounter (HOSPITAL_COMMUNITY): Payer: Self-pay

## 2023-05-01 DIAGNOSIS — I110 Hypertensive heart disease with heart failure: Secondary | ICD-10-CM | POA: Diagnosis not present

## 2023-05-01 DIAGNOSIS — Z7984 Long term (current) use of oral hypoglycemic drugs: Secondary | ICD-10-CM | POA: Diagnosis not present

## 2023-05-01 DIAGNOSIS — N390 Urinary tract infection, site not specified: Secondary | ICD-10-CM | POA: Diagnosis not present

## 2023-05-01 DIAGNOSIS — F32A Depression, unspecified: Secondary | ICD-10-CM | POA: Diagnosis present

## 2023-05-01 DIAGNOSIS — Z7982 Long term (current) use of aspirin: Secondary | ICD-10-CM

## 2023-05-01 DIAGNOSIS — K5900 Constipation, unspecified: Secondary | ICD-10-CM | POA: Diagnosis not present

## 2023-05-01 DIAGNOSIS — Z743 Need for continuous supervision: Secondary | ICD-10-CM | POA: Diagnosis not present

## 2023-05-01 DIAGNOSIS — L03317 Cellulitis of buttock: Secondary | ICD-10-CM | POA: Diagnosis not present

## 2023-05-01 DIAGNOSIS — E1165 Type 2 diabetes mellitus with hyperglycemia: Secondary | ICD-10-CM | POA: Diagnosis not present

## 2023-05-01 DIAGNOSIS — N3000 Acute cystitis without hematuria: Secondary | ICD-10-CM

## 2023-05-01 DIAGNOSIS — E1142 Type 2 diabetes mellitus with diabetic polyneuropathy: Secondary | ICD-10-CM | POA: Diagnosis not present

## 2023-05-01 DIAGNOSIS — E860 Dehydration: Secondary | ICD-10-CM | POA: Diagnosis not present

## 2023-05-01 DIAGNOSIS — K5641 Fecal impaction: Secondary | ICD-10-CM | POA: Diagnosis not present

## 2023-05-01 DIAGNOSIS — F03918 Unspecified dementia, unspecified severity, with other behavioral disturbance: Secondary | ICD-10-CM | POA: Diagnosis not present

## 2023-05-01 DIAGNOSIS — I252 Old myocardial infarction: Secondary | ICD-10-CM

## 2023-05-01 DIAGNOSIS — I2510 Atherosclerotic heart disease of native coronary artery without angina pectoris: Secondary | ICD-10-CM | POA: Diagnosis not present

## 2023-05-01 DIAGNOSIS — R627 Adult failure to thrive: Secondary | ICD-10-CM | POA: Diagnosis not present

## 2023-05-01 DIAGNOSIS — I700 Atherosclerosis of aorta: Secondary | ICD-10-CM | POA: Diagnosis not present

## 2023-05-01 DIAGNOSIS — I10 Essential (primary) hypertension: Secondary | ICD-10-CM | POA: Diagnosis not present

## 2023-05-01 DIAGNOSIS — Z66 Do not resuscitate: Secondary | ICD-10-CM | POA: Diagnosis present

## 2023-05-01 DIAGNOSIS — E785 Hyperlipidemia, unspecified: Secondary | ICD-10-CM | POA: Diagnosis present

## 2023-05-01 DIAGNOSIS — J439 Emphysema, unspecified: Secondary | ICD-10-CM | POA: Diagnosis present

## 2023-05-01 DIAGNOSIS — F03A3 Unspecified dementia, mild, with mood disturbance: Secondary | ICD-10-CM | POA: Diagnosis present

## 2023-05-01 DIAGNOSIS — R1032 Left lower quadrant pain: Secondary | ICD-10-CM | POA: Diagnosis not present

## 2023-05-01 DIAGNOSIS — T502X5A Adverse effect of carbonic-anhydrase inhibitors, benzothiadiazides and other diuretics, initial encounter: Secondary | ICD-10-CM | POA: Diagnosis present

## 2023-05-01 DIAGNOSIS — R531 Weakness: Secondary | ICD-10-CM | POA: Diagnosis not present

## 2023-05-01 DIAGNOSIS — E1140 Type 2 diabetes mellitus with diabetic neuropathy, unspecified: Secondary | ICD-10-CM | POA: Diagnosis present

## 2023-05-01 DIAGNOSIS — K5909 Other constipation: Secondary | ICD-10-CM | POA: Diagnosis present

## 2023-05-01 DIAGNOSIS — G9341 Metabolic encephalopathy: Secondary | ICD-10-CM | POA: Diagnosis present

## 2023-05-01 DIAGNOSIS — Z888 Allergy status to other drugs, medicaments and biological substances status: Secondary | ICD-10-CM

## 2023-05-01 DIAGNOSIS — Z882 Allergy status to sulfonamides status: Secondary | ICD-10-CM

## 2023-05-01 DIAGNOSIS — A498 Other bacterial infections of unspecified site: Secondary | ICD-10-CM | POA: Diagnosis not present

## 2023-05-01 DIAGNOSIS — E1169 Type 2 diabetes mellitus with other specified complication: Secondary | ICD-10-CM

## 2023-05-01 DIAGNOSIS — Z79899 Other long term (current) drug therapy: Secondary | ICD-10-CM

## 2023-05-01 DIAGNOSIS — E876 Hypokalemia: Secondary | ICD-10-CM | POA: Diagnosis present

## 2023-05-01 DIAGNOSIS — Z823 Family history of stroke: Secondary | ICD-10-CM

## 2023-05-01 DIAGNOSIS — E782 Mixed hyperlipidemia: Secondary | ICD-10-CM | POA: Diagnosis not present

## 2023-05-01 DIAGNOSIS — I5032 Chronic diastolic (congestive) heart failure: Secondary | ICD-10-CM | POA: Diagnosis present

## 2023-05-01 DIAGNOSIS — B9689 Other specified bacterial agents as the cause of diseases classified elsewhere: Secondary | ICD-10-CM | POA: Diagnosis present

## 2023-05-01 DIAGNOSIS — R6889 Other general symptoms and signs: Secondary | ICD-10-CM | POA: Diagnosis not present

## 2023-05-01 DIAGNOSIS — Z7902 Long term (current) use of antithrombotics/antiplatelets: Secondary | ICD-10-CM | POA: Diagnosis not present

## 2023-05-01 DIAGNOSIS — Z885 Allergy status to narcotic agent status: Secondary | ICD-10-CM

## 2023-05-01 DIAGNOSIS — Z8249 Family history of ischemic heart disease and other diseases of the circulatory system: Secondary | ICD-10-CM | POA: Diagnosis not present

## 2023-05-01 DIAGNOSIS — Z8673 Personal history of transient ischemic attack (TIA), and cerebral infarction without residual deficits: Secondary | ICD-10-CM

## 2023-05-01 DIAGNOSIS — R279 Unspecified lack of coordination: Secondary | ICD-10-CM | POA: Diagnosis not present

## 2023-05-01 DIAGNOSIS — F0392 Unspecified dementia, unspecified severity, with psychotic disturbance: Secondary | ICD-10-CM | POA: Diagnosis not present

## 2023-05-01 DIAGNOSIS — M6281 Muscle weakness (generalized): Secondary | ICD-10-CM | POA: Diagnosis not present

## 2023-05-01 DIAGNOSIS — R5381 Other malaise: Secondary | ICD-10-CM | POA: Diagnosis not present

## 2023-05-01 DIAGNOSIS — I5023 Acute on chronic systolic (congestive) heart failure: Secondary | ICD-10-CM | POA: Diagnosis not present

## 2023-05-01 DIAGNOSIS — I25810 Atherosclerosis of coronary artery bypass graft(s) without angina pectoris: Secondary | ICD-10-CM | POA: Diagnosis present

## 2023-05-01 LAB — URINALYSIS, W/ REFLEX TO CULTURE (INFECTION SUSPECTED)
Bilirubin Urine: NEGATIVE
Glucose, UA: 150 mg/dL — AB
Ketones, ur: 80 mg/dL — AB
Nitrite: NEGATIVE
Protein, ur: 100 mg/dL — AB
Specific Gravity, Urine: 1.02 (ref 1.005–1.030)
WBC, UA: >50 WBC/hpf (ref 0–5)
pH: 5 (ref 5.0–8.0)

## 2023-05-01 LAB — CBC WITH DIFFERENTIAL/PLATELET
Abs Immature Granulocytes: 0.03 10*3/uL (ref 0.00–0.07)
Basophils Absolute: 0.1 10*3/uL (ref 0.0–0.1)
Basophils Relative: 1 %
Eosinophils Absolute: 0.1 10*3/uL (ref 0.0–0.5)
Eosinophils Relative: 1 %
HCT: 43.3 % (ref 36.0–46.0)
Hemoglobin: 14.3 g/dL (ref 12.0–15.0)
Immature Granulocytes: 0 %
Lymphocytes Relative: 14 %
Lymphs Abs: 1.3 10*3/uL (ref 0.7–4.0)
MCH: 28.1 pg (ref 26.0–34.0)
MCHC: 33 g/dL (ref 30.0–36.0)
MCV: 85.1 fL (ref 80.0–100.0)
Monocytes Absolute: 0.8 10*3/uL (ref 0.1–1.0)
Monocytes Relative: 8 %
Neutro Abs: 6.9 10*3/uL (ref 1.7–7.7)
Neutrophils Relative %: 76 %
Platelets: 244 10*3/uL (ref 150–400)
RBC: 5.09 MIL/uL (ref 3.87–5.11)
RDW: 13.3 % (ref 11.5–15.5)
WBC: 9.1 10*3/uL (ref 4.0–10.5)
nRBC: 0 % (ref 0.0–0.2)

## 2023-05-01 LAB — COMPREHENSIVE METABOLIC PANEL
ALT: 10 U/L (ref 0–44)
AST: 19 U/L (ref 15–41)
Albumin: 3.2 g/dL — ABNORMAL LOW (ref 3.5–5.0)
Alkaline Phosphatase: 91 U/L (ref 38–126)
Anion gap: 16 — ABNORMAL HIGH (ref 5–15)
BUN: 15 mg/dL (ref 8–23)
CO2: 22 mmol/L (ref 22–32)
Calcium: 9.2 mg/dL (ref 8.9–10.3)
Chloride: 99 mmol/L (ref 98–111)
Creatinine, Ser: 0.9 mg/dL (ref 0.44–1.00)
GFR, Estimated: >60 mL/min (ref >60)
Glucose, Bld: 230 mg/dL — ABNORMAL HIGH (ref 70–99)
Potassium: 3.1 mmol/L — ABNORMAL LOW (ref 3.5–5.1)
Sodium: 137 mmol/L (ref 135–145)
Total Bilirubin: 1.4 mg/dL — ABNORMAL HIGH (ref 0.3–1.2)
Total Protein: 6.9 g/dL (ref 6.5–8.1)

## 2023-05-01 LAB — TROPONIN I (HIGH SENSITIVITY): Troponin I (High Sensitivity): 40 ng/L — ABNORMAL HIGH (ref <18)

## 2023-05-01 LAB — GLUCOSE, CAPILLARY: Glucose-Capillary: 189 mg/dL — ABNORMAL HIGH (ref 70–99)

## 2023-05-01 MED ORDER — FESOTERODINE FUMARATE ER 4 MG PO TB24: 4.0000 mg | ORAL_TABLET | Freq: Every day | ORAL | Status: DC

## 2023-05-01 MED ORDER — NITROGLYCERIN 0.4 MG SL SUBL: 0.4000 mg | SUBLINGUAL_TABLET | SUBLINGUAL | Status: DC | PRN

## 2023-05-01 MED ORDER — ZOLPIDEM TARTRATE 5 MG PO TABS
5.0000 mg | ORAL_TABLET | Freq: Every evening | ORAL | Status: DC | PRN
  Administered 2023-05-02: 5 mg via ORAL
  Filled 2023-05-01: qty 1

## 2023-05-01 MED ORDER — LOSARTAN POTASSIUM 25 MG PO TABS: 25.0000 mg | ORAL_TABLET | Freq: Every day | ORAL | Status: DC

## 2023-05-01 MED ORDER — SERTRALINE HCL 50 MG PO TABS: 25.0000 mg | ORAL_TABLET | Freq: Every day | ORAL | Status: DC

## 2023-05-01 MED ORDER — ROSUVASTATIN CALCIUM 20 MG PO TABS
20.0000 mg | ORAL_TABLET | Freq: Every day | ORAL | Status: DC
  Administered 2023-05-01 – 2023-05-04 (×4): 20 mg via ORAL
  Filled 2023-05-01 (×4): qty 1

## 2023-05-01 MED ORDER — METOPROLOL SUCCINATE ER 50 MG PO TB24
50.0000 mg | ORAL_TABLET | Freq: Every day | ORAL | Status: DC
  Administered 2023-05-02 – 2023-05-05 (×4): 50 mg via ORAL
  Filled 2023-05-01 (×4): qty 1

## 2023-05-01 MED ORDER — BISACODYL 10 MG RE SUPP
10.0000 mg | Freq: Three times a day (TID) | RECTAL | Status: AC
  Administered 2023-05-01 – 2023-05-02 (×2): 10 mg via RECTAL
  Filled 2023-05-01 (×2): qty 1

## 2023-05-01 MED ORDER — TICAGRELOR 90 MG PO TABS
90.0000 mg | ORAL_TABLET | Freq: Two times a day (BID) | ORAL | Status: DC
  Administered 2023-05-01 – 2023-05-05 (×8): 90 mg via ORAL
  Filled 2023-05-01 (×8): qty 1

## 2023-05-01 MED ORDER — MAGNESIUM HYDROXIDE 400 MG/5ML PO SUSP
30.0000 mL | Freq: Two times a day (BID) | ORAL | Status: AC
  Administered 2023-05-01 – 2023-05-02 (×2): 30 mL via ORAL
  Filled 2023-05-01 (×2): qty 30

## 2023-05-01 MED ORDER — SERTRALINE HCL 50 MG PO TABS
75.0000 mg | ORAL_TABLET | Freq: Every day | ORAL | Status: DC
  Administered 2023-05-01 – 2023-05-05 (×5): 75 mg via ORAL
  Filled 2023-05-01 (×5): qty 2

## 2023-05-01 MED ORDER — ASPIRIN 81 MG PO TBEC
81.0000 mg | DELAYED_RELEASE_TABLET | Freq: Every day | ORAL | Status: DC
  Administered 2023-05-02 – 2023-05-05 (×4): 81 mg via ORAL
  Filled 2023-05-01 (×4): qty 1

## 2023-05-01 MED ORDER — ACETAMINOPHEN 325 MG PO TABS: 650.0000 mg | ORAL_TABLET | Freq: Four times a day (QID) | ORAL | Status: DC | PRN

## 2023-05-01 MED ORDER — TAMSULOSIN HCL 0.4 MG PO CAPS
0.4000 mg | ORAL_CAPSULE | Freq: Every day | ORAL | Status: DC
  Administered 2023-05-01 – 2023-05-04 (×4): 0.4 mg via ORAL
  Filled 2023-05-01 (×4): qty 1

## 2023-05-01 MED ORDER — POTASSIUM CHLORIDE CRYS ER 20 MEQ PO TBCR
40.0000 meq | EXTENDED_RELEASE_TABLET | Freq: Four times a day (QID) | ORAL | Status: AC
  Administered 2023-05-01 (×2): 40 meq via ORAL
  Filled 2023-05-01 (×2): qty 2

## 2023-05-01 MED ORDER — SODIUM CHLORIDE 0.9 % IV SOLN
1.0000 g | Freq: Once | INTRAVENOUS | Status: AC
  Administered 2023-05-01: 1 g via INTRAVENOUS
  Filled 2023-05-01: qty 10

## 2023-05-01 MED ORDER — SORBITOL 70 % SOLN
960.0000 mL | TOPICAL_OIL | Freq: Once | ORAL | Status: AC
  Administered 2023-05-02: 960 mL via RECTAL
  Filled 2023-05-01 (×2): qty 240

## 2023-05-01 MED ORDER — BISACODYL 5 MG PO TBEC
10.0000 mg | DELAYED_RELEASE_TABLET | Freq: Every day | ORAL | Status: AC
  Administered 2023-05-01 – 2023-05-02 (×2): 10 mg via ORAL
  Filled 2023-05-01 (×2): qty 2

## 2023-05-01 MED ORDER — SODIUM CHLORIDE 0.9 % IV SOLN
1.0000 g | INTRAVENOUS | Status: DC
  Administered 2023-05-02: 1 g via INTRAVENOUS
  Filled 2023-05-01: qty 10

## 2023-05-01 MED ORDER — LACTATED RINGERS IV BOLUS
500.0000 mL | Freq: Once | INTRAVENOUS | Status: AC
  Administered 2023-05-01: 500 mL via INTRAVENOUS

## 2023-05-01 MED ORDER — IOHEXOL 300 MG/ML  SOLN
80.0000 mL | Freq: Once | INTRAMUSCULAR | Status: AC | PRN
  Administered 2023-05-01: 80 mL via INTRAVENOUS

## 2023-05-01 MED ORDER — SODIUM CHLORIDE 0.9 % IV SOLN: INTRAVENOUS | Status: DC

## 2023-05-01 MED ORDER — ACETAMINOPHEN 650 MG RE SUPP: 650.0000 mg | Freq: Four times a day (QID) | RECTAL | Status: DC | PRN

## 2023-05-01 MED ORDER — METOPROLOL TARTRATE 25 MG PO TABS: 25.0000 mg | ORAL_TABLET | Freq: Two times a day (BID) | ORAL | Status: DC

## 2023-05-01 MED ORDER — HYDRALAZINE HCL 20 MG/ML IJ SOLN
5.0000 mg | INTRAMUSCULAR | Status: DC | PRN
  Administered 2023-05-01: 5 mg via INTRAVENOUS
  Filled 2023-05-01: qty 1

## 2023-05-01 MED ORDER — HEPARIN SODIUM (PORCINE) 5000 UNIT/ML IJ SOLN
5000.0000 [IU] | Freq: Three times a day (TID) | INTRAMUSCULAR | Status: DC
  Administered 2023-05-01 – 2023-05-05 (×11): 5000 [IU] via SUBCUTANEOUS
  Filled 2023-05-01 (×11): qty 1

## 2023-05-01 MED ORDER — ALBUTEROL SULFATE (2.5 MG/3ML) 0.083% IN NEBU: 2.5000 mg | INHALATION_SOLUTION | RESPIRATORY_TRACT | Status: DC | PRN

## 2023-05-01 NOTE — ED Notes (Signed)
Report given. Receiving nurse will give oncoming nurse report. Pt to be transported after shift change.

## 2023-05-01 NOTE — Heart Team MDD (Deleted)
TRH H&P   Patient Demographics:    [name], is a 89 y.o. female  MRN: [id number]   DOB - 1929/01/04  Admit Date - 05/01/2023  Outpatient Primary MD for the patient is Assunta Found, MD  Referring MD/NP/PA: Dr Particia Nearing  Patient coming from: home   Chief Complaint  Patient presents with   Weakness      HPI:    [name]  is a 89 y.o. female, with medical history significant of diabetes mellitus, hypertension, stroke, urinary incontinence, CAD, with recent MRI of January of this year, patient brought to ED by her daughter secondary to failure to thrive, generalized weakness and altered mental status, with mild dementia at baseline, daughter helps to provide the history as patient is confused, over the last 3 days, patient has been barely eating, getting weaker, more confused, communicative, this resembles previous episodes of UTI in the past, as well she had vomiting once or twice over the last couple days, no cough, no fever, no chills, patient endorses some lower abdominal pain and, but very poor historian cannot specify any more. -In ED workup significant for pyuria, bacteriuria, low potassium at 3.1, she was afebrile, blood pressure was elevated, CT head with no acute findings, CT abdomen pelvis significant for fecal impaction, but upon rectal exam by ED patient, only soft stools noted, [facility] hospitalist consulted to admit.    Review of systems:   Unable to obtain appropriate review of system given patient dementia and confusion  With Past History of the following :    Past Medical History:  Diagnosis Date   CAD (coronary artery disease)    a. 12/2022 Inf STEMI/PCI: LM nl, LAD mild diff dzs, D1 50, D2 50., LCX 75d, OM1 60, RCA 90p (2.5x20 Synergy XD DES), 100d (PTCA-->90 residual).   Diabetes mellitus with neuropathy    Diabetic neuropathy (HCC)    Diastolic  dysfunction    a. 12/2022 Echo: EF 60-65%, no rwma, GrI DD, nl RV size/fxn, triv MR.   Hypertension    Stroke [facility])    Urinary incontinence       Past Surgical History:  Procedure Laterality Date   ABDOMINAL HYSTERECTOMY     APPENDECTOMY     BREAST SURGERY     begnin tumor removed   CATARACT EXTRACTION, BILATERAL     CORONARY ANGIOGRAPHY N/A 12/31/2022   Procedure: CORONARY ANGIOGRAPHY;  Surgeon: Yvonne Kendall, MD;  Location: [facility] INVASIVE [facility];  Service: Cardiovascular;  Laterality: N/A;   CORONARY/GRAFT ACUTE MI REVASCULARIZATION N/A 12/31/2022   Procedure: Coronary/Graft Acute MI Revascularization;  Surgeon: Yvonne Kendall, MD;  Location: [facility];  Service: Cardiovascular;  Laterality: N/A;      Social History:     Social History   Tobacco Use   Smoking status: Never   Smokeless tobacco: Never  Substance Use Topics   Alcohol use: No  Family History :     Family History  Problem Relation Age of Onset   CAD Mother    Hypertension Mother    Stroke Father    Heart attack Brother    Cancer Brother       Home Medications:   Prior to Admission medications   Medication Sig Start Date End Date Taking? Authorizing Provider  acetaminophen (TYLENOL) 500 MG tablet Take 500 mg by mouth every 6 (six) hours as needed for mild pain.   Yes [provider]  aspirin EC 81 MG tablet Take 1 tablet (81 mg total) by mouth daily. Swallow whole. 01/05/23  Yes Dunn, Tacey Ruiz, PA-C  Cholecalciferol (VITAMIN D3) 5000 units CAPS Take 1 capsule (5,000 Units total) by mouth daily. 04/15/17  Yes Nida, Denman [name], MD  diclofenac Sodium (VOLTAREN ARTHRITIS PAIN) 1 % GEL Apply 2 g topically 3 (three) times daily. bilateral knees for osteoarthritis 07/25/22  Yes Sharee Holster, NP  gabapentin (NEURONTIN) 300 MG capsule Take 300 mg three times daily Patient taking differently: Take 300 mg by mouth See admin instructions. Take 2 capsules in the morning and 1 capsule  every evening 07/25/22  Yes Sharee Holster, NP  metFORMIN (GLUCOPHAGE) 500 MG tablet Take 1 tablet (500 mg total) by mouth daily. 02/13/23 05/01/23 Yes Shah, Pratik D, DO  nitroGLYCERIN (NITROSTAT) 0.4 MG SL tablet Place 1 tablet (0.4 mg total) under the tongue every 5 (five) minutes as needed for chest pain (up to 3 doses. If taking 3rd dose, call 911). 01/04/23  Yes Dunn, Dayna N, PA-C  rosuvastatin (CRESTOR) 20 MG tablet Take 1 tablet (20 mg total) by mouth daily. 01/05/23  Yes Dunn, Dayna N, PA-C  sertraline (ZOLOFT) 25 MG tablet Take 25 mg by mouth daily. Take with 50mg  dose to equal 75mg  daily.   Yes [provider]  sertraline (ZOLOFT) 50 MG tablet Take 50 mg by mouth daily. Take with 25mg  dose to equal 75mg  daily.   Yes [provider]  silodosin (RAPAFLO) 8 MG CAPS capsule Take 1 capsule (8 mg total) by mouth daily with breakfast. 10/10/22  Yes Summerlin, Regan Rakers, PA-C  simvastatin (ZOCOR) 20 MG tablet Take 20 mg by mouth daily. 04/01/23  Yes [provider]  ticagrelor (BRILINTA) 90 MG TABS tablet Take 1 tablet (90 mg total) by mouth 2 (two) times daily. 01/04/23  Yes Dunn, Dayna N, PA-C  Trospium Chloride 60 MG CP24 Take 1 capsule by mouth daily. 04/01/23  Yes [provider]  zolpidem (AMBIEN) 10 MG tablet Take 10 mg by mouth at bedtime as needed for sleep. 01/22/23  Yes [provider]  blood glucose meter kit and supplies KIT Dispense based on patient and insurance preference. Use to TEST BLOOD GLUCOSE THREE times daily as directed. DX E11.65 02/10/21   Roma Kayser, MD  Insulin Pen Needle (BD AUTOSHIELD DUO) 30G X 5 MM MISC by Does not apply route as directed. 30 gauge X 3/16    [provider]  losartan (COZAAR) 25 MG tablet Take 1 tablet (25 mg total) by mouth daily. Patient not taking: Reported on 05/01/2023 01/05/23   Laurann Montana, PA-C  metoprolol succinate (TOPROL-XL) 50 MG 24 hr tablet Take 50 mg by mouth at bedtime.  04/01/23   [provider]  metoprolol tartrate (LOPRESSOR) 25 MG tablet Take 1 tablet (25 mg total) by mouth 2 (two) times daily. Patient not taking: Reported on 05/01/2023 01/04/23   Ronie Spies  N, PA-C  solifenacin (VESICARE) 10 MG tablet Take 1 tablet (10 mg total) by mouth daily. Patient not taking: Reported on 05/01/2023 02/26/23   Malen Gauze, MD     Allergies:     Allergies  Allergen Reactions   Codeine Nausea And Vomiting   Glipizide     Hypoglycemia with glucoses recorded in the 20s necessitating glucagon and D10 IV   Sulfa Antibiotics Other (See Comments)    Unspecified      Physical Exam:   Vitals  Blood pressure (!) 159/79, pulse 85, temperature 97.9 F (36.6 C), temperature source Oral, resp. rate 17, SpO2 98 %.   1. General frail elderly female, laying in bed, no apparent distress  2.  Pleasant, but significantly confused, oriented x 1, impaired judgment and insight   3. No F.N deficits, ALL C.Nerves Intact, Strength 5/5 all 4 extremities, Sensation intact all 4 extremities, Plantars down going.  4. Ears and Eyes appear Normal, Conjunctivae clear, PERRLA. Moist Oral Mucosa.  5. Supple Neck, No JVD, No cervical lymphadenopathy appriciated, No Carotid Bruits.  6. Symmetrical Chest wall movement, Good air movement bilaterally, CTAB.  7. RRR, No Gallops, Rubs or Murmurs, No Parasternal Heave.  8. Positive Bowel Sounds, Abdomen Soft, No tenderness, No organomegaly appriciated,No rebound -guarding or rigidity.  9.  No Cyanosis, Normal Skin Turgor, No Skin Rash or Bruise.  10. Good muscle tone,  joints appear normal , no effusions, Normal ROM.     Data Review:    CBC Recent Labs  Lab 05/01/23 1219  WBC 9.1  HGB 14.3  HCT 43.3  PLT 244  MCV 85.1  MCH 28.1  MCHC 33.0  RDW 13.3  LYMPHSABS 1.3  MONOABS 0.8  EOSABS 0.1  BASOSABS 0.1    ------------------------------------------------------------------------------------------------------------------  Chemistries  Recent Labs  Lab 05/01/23 1219  NA 137  K 3.1*  CL 99  CO2 22  GLUCOSE 230*  BUN 15  CREATININE 0.90  CALCIUM 9.2  AST 19  ALT 10  ALKPHOS 91  BILITOT 1.4*   ------------------------------------------------------------------------------------------------------------------ CrCl cannot be calculated (Unknown ideal weight.). ------------------------------------------------------------------------------------------------------------------ No results for input(s): "TSH", "T4TOTAL", "T3FREE", "THYROIDAB" in the last 72 hours.  Invalid input(s): "FREET3"  Coagulation profile No results for input(s): "INR", "PROTIME" in the last 168 hours. ------------------------------------------------------------------------------------------------------------------- No results for input(s): "DDIMER" in the last 72 hours. -------------------------------------------------------------------------------------------------------------------  Cardiac Enzymes No results for input(s): "CKMB", "TROPONINI", "MYOGLOBIN" in the last 168 hours.  Invalid input(s): "CK" ------------------------------------------------------------------------------------------------------------------    Component Value Date/Time   BNP 510.0 (H) 12/31/2022 2020     ---------------------------------------------------------------------------------------------------------------  Urinalysis    Component Value Date/Time   COLORURINE AMBER (A) 05/01/2023 1342   APPEARANCEUR CLOUDY (A) 05/01/2023 1342   APPEARANCEUR Clear 10/25/2021 1450   LABSPEC 1.020 05/01/2023 1342   PHURINE 5.0 05/01/2023 1342   GLUCOSEU 150 (A) 05/01/2023 1342   HGBUR SMALL (A) 05/01/2023 1342   BILIRUBINUR NEGATIVE 05/01/2023 1342   BILIRUBINUR Negative 10/25/2021 1450   KETONESUR 80 (A) 05/01/2023 1342   PROTEINUR  100 (A) 05/01/2023 1342   UROBILINOGEN 0.2 09/06/2009 0135   NITRITE NEGATIVE 05/01/2023 1342   LEUKOCYTESUR LARGE (A) 05/01/2023 1342    ----------------------------------------------------------------------------------------------------------------   Imaging Results:    CT ABDOMEN PELVIS W CONTRAST  Result Date: 05/01/2023 CLINICAL DATA:  Left lower quadrant abdominal pain. EXAM: CT ABDOMEN AND PELVIS WITH CONTRAST TECHNIQUE: Multidetector CT imaging of the abdomen and pelvis was performed using the standard protocol following bolus administration of intravenous contrast. RADIATION  DOSE REDUCTION: This exam was performed according to the departmental dose-optimization program which includes automated exposure control, adjustment of the mA and/or kV according to patient size and/or use of iterative reconstruction technique. CONTRAST:  80mL OMNIPAQUE IOHEXOL 300 MG/ML  SOLN COMPARISON:  August 13, 2022. FINDINGS: Lower chest: No acute abnormality. Hepatobiliary: No focal liver abnormality is seen. No gallstones, gallbladder wall thickening, or biliary dilatation. Pancreas: Unremarkable. No pancreatic ductal dilatation or surrounding inflammatory changes. Spleen: Normal in size without focal abnormality. Adrenals/Urinary Tract: Stable probable left adrenal adenoma. Right adrenal gland is unremarkable. No hydronephrosis or renal obstruction is noted. No renal or ureteral calculi are noted. Urinary bladder is unremarkable. Stomach/Bowel: Stomach is unremarkable. Stool is noted throughout the colon. The appendix is not visualized. There is no evidence of bowel obstruction or inflammation. Moderate amount of stool is noted in the rectum concerning for impaction. Vascular/Lymphatic: Aortic atherosclerosis. No enlarged abdominal or pelvic lymph nodes. Reproductive: Status post hysterectomy. 4.5 x 4.0 cm triangular-shaped fluid collection is seen in left adnexal region which is not significantly changed compared  to prior exam. Other: No abdominal wall hernia or abnormality. No abdominopelvic ascites. Musculoskeletal: Status post surgical fusion of L4-5. No acute osseous abnormality is noted. Mildly increased inflammatory stranding is seen lateral to the proximal left femur which may represent cellulitis or potentially trochanteric bursitis. IMPRESSION: Moderate amount of stool seen in rectum concerning for impaction. Grossly stable 4.5 x 5.4 cm triangular-shaped fluid collection seen in left adnexal region concerning for adnexal cystic lesion. This is not considered to be likely clinically significant. Stable probable left adrenal adenoma. Mildly increased inflammatory stranding is seen in the soft tissues lateral to the proximal left femur which may represent cellulitis or potentially trochanteric bursitis. Aortic Atherosclerosis (ICD10-I70.0). Electronically Signed   By: Lupita Raider M.D.   On: 05/01/2023 15:30   CT Head Wo Contrast  Result Date: 05/01/2023 CLINICAL DATA:  Mental status change, unknown cause. EXAM: CT HEAD WITHOUT CONTRAST TECHNIQUE: Contiguous axial images were obtained from the base of the skull through the vertex without intravenous contrast. RADIATION DOSE REDUCTION: This exam was performed according to the departmental dose-optimization program which includes automated exposure control, adjustment of the mA and/or kV according to patient size and/or use of iterative reconstruction technique. COMPARISON:  Head CT 08/13/2022. FINDINGS: Brain: No acute hemorrhage. Unchanged moderate chronic small-vessel disease. Cortical gray-white differentiation is otherwise preserved. Prominence of the ventricles and sulci within expected range for age. No hydrocephalus or extra-axial collection. No mass effect or midline shift. Vascular: No hyperdense vessel or unexpected calcification. Skull: No calvarial fracture or suspicious bone lesion. Skull base is unremarkable. Sinuses/Orbits: Unremarkable. Other:  None. IMPRESSION: 1. No acute intracranial abnormality. 2. Unchanged moderate chronic small-vessel disease. Electronically Signed   By: Orvan Falconer M.D.   On: 05/01/2023 15:18   DG Chest Portable 1 View  Result Date: 05/01/2023 CLINICAL DATA:  Two day history of weakness, decreased appetite, and lethargy EXAM: PORTABLE CHEST 1 VIEW COMPARISON:  Chest radiograph dated 02/10/2023 FINDINGS: Normal lung volumes. No focal consolidations. No pleural effusion or pneumothorax. The heart size and mediastinal contours are within normal limits. No acute osseous abnormality. Right humeral bone anchors. IMPRESSION: No active disease. Electronically Signed   By: Agustin Cree M.D.   On: 05/01/2023 12:03       Assessment & Plan:    Principal Problem:   UTI (urinary tract infection) Active Problems:   Acute metabolic encephalopathy   Controlled type 2 diabetes  with neuropathy (HCC)   Hyperlipidemia   Diabetic neuropathy associated with type 2 diabetes mellitus (HCC)   Chronic constipation   Aortic atherosclerosis (HCC)   Emphysema of lung (HCC)   CAD (coronary artery disease)   Failure to thrive/deconditioning Acute metabolic encephalopathy -Is in the setting of acute infection related to UTI, will start on gentle hydration, treat her UTI, consult PT/OT -CT head with no acute findings  UTI -Continue with IV Rocephin, follow on urine cultures  Diabetes mellitus, type II -Metformin, will monitor CBG without any insulin coverage for now given oral intake is unreliable, and recent admission for hypoglycemia.   Hypokalemia -Repleted, recheck in a.m.   Essential hypertension -Continue with metoprolol   Depression -Continue zoloft   Constipation/fecal impaction -Exam per ED physician reports soft stools in the rectal vault, will give smog enema, scheduled suppositories and laxatives    CAD (coronary artery disease) -Continue asa, beta blocker, statin, Brilinta    Hyperlipidemia -Continue  statin   Overactive bladder-resolved as of 02/10/2023 -Continue rapaflo   DVT Prophylaxis Heparin  AM Labs Ordered, also please review Full Orders  Family Communication: Admission, patients condition and plan of care including tests being ordered have been discussed with the patient and DAUGHTER by phone who indicate understanding and agree with the plan and Code Status.  Code Status DNR, confirmed by daughter  Likely DC to  home  Condition GUARDED    Consults called: none    Admission status: observation    Time spent in minutes : 70 minutes   Huey Bienenstock M.D on 05/01/2023 at 5:03 PM   [facility] - Office  [phone]

## 2023-05-01 NOTE — ED Notes (Signed)
Fluids delayed due to inability to obtain IV access

## 2023-05-01 NOTE — ED Notes (Signed)
Attempted to call report.  Receiving nurse to call back.

## 2023-05-01 NOTE — H&P (Signed)
TRH H&P   Patient Demographics:    [name], is a 89 y.o. female  MRN: [id number]   DOB - 1929/01/04  Admit Date - 05/01/2023  Outpatient Primary MD for the patient is Assunta Found, MD  Referring MD/NP/PA: Dr Particia Nearing  Patient coming from: home   Chief Complaint  Patient presents with   Weakness      HPI:    [name]  is a 89 y.o. female, with medical history significant of diabetes mellitus, hypertension, stroke, urinary incontinence, CAD, with recent MRI of January of this year, patient brought to ED by her daughter secondary to failure to thrive, generalized weakness and altered mental status, with mild dementia at baseline, daughter helps to provide the history as patient is confused, over the last 3 days, patient has been barely eating, getting weaker, more confused, communicative, this resembles previous episodes of UTI in the past, as well she had vomiting once or twice over the last couple days, no cough, no fever, no chills, patient endorses some lower abdominal pain and, but very poor historian cannot specify any more. -In ED workup significant for pyuria, bacteriuria, low potassium at 3.1, she was afebrile, blood pressure was elevated, CT head with no acute findings, CT abdomen pelvis significant for fecal impaction, but upon rectal exam by ED patient, only soft stools noted, [facility] consulted to admit.    Review of systems:   Unable to obtain appropriate review of system given patient dementia and confusion  With Past History of the following :    Past Medical History:  Diagnosis Date   CAD (coronary artery disease)    a. 12/2022 Inf STEMI/PCI: LM nl, LAD mild diff dzs, D1 50, D2 50., LCX 75d, OM1 60, RCA 90p (2.5x20 Synergy XD DES), 100d (PTCA-->90 residual).   Diabetes mellitus with neuropathy    Diabetic neuropathy (HCC)    Diastolic  dysfunction    a. 12/2022 Echo: EF 60-65%, no rwma, GrI DD, nl RV size/fxn, triv MR.   Hypertension    Stroke [facility])    Urinary incontinence       Past Surgical History:  Procedure Laterality Date   ABDOMINAL HYSTERECTOMY     APPENDECTOMY     BREAST SURGERY     begnin tumor removed   CATARACT EXTRACTION, BILATERAL     CORONARY ANGIOGRAPHY N/A 12/31/2022   Procedure: CORONARY ANGIOGRAPHY;  Surgeon: Yvonne Kendall, MD;  Location: [facility];  Service: Cardiovascular;  Laterality: N/A;   CORONARY/GRAFT ACUTE MI REVASCULARIZATION N/A 12/31/2022   Procedure: Coronary/Graft Acute MI Revascularization;  Surgeon: Yvonne Kendall, MD;  Location: [facility];  Service: Cardiovascular;  Laterality: N/A;      Social History:     Social History   Tobacco Use   Smoking status: Never   Smokeless tobacco: Never  Substance Use Topics   Alcohol use: No  Family History :     Family History  Problem Relation Age of Onset   CAD Mother    Hypertension Mother    Stroke Father    Heart attack Brother    Cancer Brother       Home Medications:   Prior to Admission medications   Medication Sig Start Date End Date Taking? Authorizing Provider  acetaminophen (TYLENOL) 500 MG tablet Take 500 mg by mouth every 6 (six) hours as needed for mild pain.   Yes [provider]  aspirin EC 81 MG tablet Take 1 tablet (81 mg total) by mouth daily. Swallow whole. 01/05/23  Yes Dunn, Tacey Ruiz, PA-C  Cholecalciferol (VITAMIN D3) 5000 units CAPS Take 1 capsule (5,000 Units total) by mouth daily. 04/15/17  Yes Nida, Denman [name], MD  diclofenac Sodium (VOLTAREN ARTHRITIS PAIN) 1 % GEL Apply 2 g topically 3 (three) times daily. bilateral knees for osteoarthritis 07/25/22  Yes Sharee Holster, NP  gabapentin (NEURONTIN) 300 MG capsule Take 300 mg three times daily Patient taking differently: Take 300 mg by mouth See admin instructions. Take 2 capsules in the morning and 1 capsule  every evening 07/25/22  Yes Sharee Holster, NP  metFORMIN (GLUCOPHAGE) 500 MG tablet Take 1 tablet (500 mg total) by mouth daily. 02/13/23 05/01/23 Yes Shah, Pratik D, DO  nitroGLYCERIN (NITROSTAT) 0.4 MG SL tablet Place 1 tablet (0.4 mg total) under the tongue every 5 (five) minutes as needed for chest pain (up to 3 doses. If taking 3rd dose, call 911). 01/04/23  Yes Dunn, Dayna N, PA-C  rosuvastatin (CRESTOR) 20 MG tablet Take 1 tablet (20 mg total) by mouth daily. 01/05/23  Yes Dunn, Dayna N, PA-C  sertraline (ZOLOFT) 25 MG tablet Take 25 mg by mouth daily. Take with 50mg  dose to equal 75mg  daily.   Yes [provider]  sertraline (ZOLOFT) 50 MG tablet Take 50 mg by mouth daily. Take with 25mg  dose to equal 75mg  daily.   Yes [provider]  silodosin (RAPAFLO) 8 MG CAPS capsule Take 1 capsule (8 mg total) by mouth daily with breakfast. 10/10/22  Yes Summerlin, Regan Rakers, PA-C  simvastatin (ZOCOR) 20 MG tablet Take 20 mg by mouth daily. 04/01/23  Yes [provider]  ticagrelor (BRILINTA) 90 MG TABS tablet Take 1 tablet (90 mg total) by mouth 2 (two) times daily. 01/04/23  Yes Dunn, Dayna N, PA-C  Trospium Chloride 60 MG CP24 Take 1 capsule by mouth daily. 04/01/23  Yes [provider]  zolpidem (AMBIEN) 10 MG tablet Take 10 mg by mouth at bedtime as needed for sleep. 01/22/23  Yes [provider]  blood glucose meter kit and supplies KIT Dispense based on patient and insurance preference. Use to TEST BLOOD GLUCOSE THREE times daily as directed. DX E11.65 02/10/21   Roma Kayser, MD  Insulin Pen Needle (BD AUTOSHIELD DUO) 30G X 5 MM MISC by Does not apply route as directed. 30 gauge X 3/16    [provider]  losartan (COZAAR) 25 MG tablet Take 1 tablet (25 mg total) by mouth daily. Patient not taking: Reported on 05/01/2023 01/05/23   Laurann Montana, PA-C  metoprolol succinate (TOPROL-XL) 50 MG 24 hr tablet Take 50 mg by mouth at bedtime.  04/01/23   [provider]  metoprolol tartrate (LOPRESSOR) 25 MG tablet Take 1 tablet (25 mg total) by mouth 2 (two) times daily. Patient not taking: Reported on 05/01/2023 01/04/23   Ronie Spies  N, PA-C  solifenacin (VESICARE) 10 MG tablet Take 1 tablet (10 mg total) by mouth daily. Patient not taking: Reported on 05/01/2023 02/26/23   Malen Gauze, MD     Allergies:     Allergies  Allergen Reactions   Codeine Nausea And Vomiting   Glipizide     Hypoglycemia with glucoses recorded in the 20s necessitating glucagon and D10 IV   Sulfa Antibiotics Other (See Comments)    Unspecified      Physical Exam:   Vitals  Blood pressure (!) 168/96, pulse 85, temperature 97.8 F (36.6 C), temperature source Oral, resp. rate 18, height 5\' 8"  (1.727 m), weight 69.9 kg, SpO2 97 %.   1. General frail elderly female, laying in bed, no apparent distress  2.  Pleasant, but significantly confused, oriented x 1, impaired judgment and insight   3. No F.N deficits, ALL C.Nerves Intact, Strength 5/5 all 4 extremities, Sensation intact all 4 extremities, Plantars down going.  4. Ears and Eyes appear Normal, Conjunctivae clear, PERRLA. Moist Oral Mucosa.  5. Supple Neck, No JVD, No cervical lymphadenopathy appriciated, No Carotid Bruits.  6. Symmetrical Chest wall movement, Good air movement bilaterally, CTAB.  7. RRR, No Gallops, Rubs or Murmurs, No Parasternal Heave.  8. Positive Bowel Sounds, Abdomen Soft, No tenderness, No organomegaly appriciated,No rebound -guarding or rigidity.  9.  No Cyanosis, Normal Skin Turgor, No Skin Rash or Bruise.  10. Good muscle tone,  joints appear normal , no effusions, Normal ROM.     Data Review:    CBC Recent Labs  Lab 05/01/23 1219  WBC 9.1  HGB 14.3  HCT 43.3  PLT 244  MCV 85.1  MCH 28.1  MCHC 33.0  RDW 13.3  LYMPHSABS 1.3  MONOABS 0.8  EOSABS 0.1  BASOSABS 0.1     ------------------------------------------------------------------------------------------------------------------  Chemistries  Recent Labs  Lab 05/01/23 1219  NA 137  K 3.1*  CL 99  CO2 22  GLUCOSE 230*  BUN 15  CREATININE 0.90  CALCIUM 9.2  AST 19  ALT 10  ALKPHOS 91  BILITOT 1.4*    ------------------------------------------------------------------------------------------------------------------ estimated creatinine clearance is 41.1 mL/min (by C-G formula based on SCr of 0.9 mg/dL). ------------------------------------------------------------------------------------------------------------------ No results for input(s): "TSH", "T4TOTAL", "T3FREE", "THYROIDAB" in the last 72 hours.  Invalid input(s): "FREET3"  Coagulation profile No results for input(s): "INR", "PROTIME" in the last 168 hours. ------------------------------------------------------------------------------------------------------------------- No results for input(s): "DDIMER" in the last 72 hours. -------------------------------------------------------------------------------------------------------------------  Cardiac Enzymes No results for input(s): "CKMB", "TROPONINI", "MYOGLOBIN" in the last 168 hours.  Invalid input(s): "CK" ------------------------------------------------------------------------------------------------------------------    Component Value Date/Time   BNP 510.0 (H) 12/31/2022 2020     ---------------------------------------------------------------------------------------------------------------  Urinalysis    Component Value Date/Time   COLORURINE AMBER (A) 05/01/2023 1342   APPEARANCEUR CLOUDY (A) 05/01/2023 1342   APPEARANCEUR Clear 10/25/2021 1450   LABSPEC 1.020 05/01/2023 1342   PHURINE 5.0 05/01/2023 1342   GLUCOSEU 150 (A) 05/01/2023 1342   HGBUR SMALL (A) 05/01/2023 1342   BILIRUBINUR NEGATIVE 05/01/2023 1342   BILIRUBINUR Negative 10/25/2021 1450    KETONESUR 80 (A) 05/01/2023 1342   PROTEINUR 100 (A) 05/01/2023 1342   UROBILINOGEN 0.2 09/06/2009 0135   NITRITE NEGATIVE 05/01/2023 1342   LEUKOCYTESUR LARGE (A) 05/01/2023 1342    ----------------------------------------------------------------------------------------------------------------   Imaging Results:    CT ABDOMEN PELVIS W CONTRAST  Result Date: 05/01/2023 CLINICAL DATA:  Left lower quadrant abdominal pain. EXAM: CT ABDOMEN AND PELVIS WITH CONTRAST TECHNIQUE: Multidetector CT imaging  of the abdomen and pelvis was performed using the standard protocol following bolus administration of intravenous contrast. RADIATION DOSE REDUCTION: This exam was performed according to the departmental dose-optimization program which includes automated exposure control, adjustment of the mA and/or kV according to patient size and/or use of iterative reconstruction technique. CONTRAST:  80mL OMNIPAQUE IOHEXOL 300 MG/ML  SOLN COMPARISON:  August 13, 2022. FINDINGS: Lower chest: No acute abnormality. Hepatobiliary: No focal liver abnormality is seen. No gallstones, gallbladder wall thickening, or biliary dilatation. Pancreas: Unremarkable. No pancreatic ductal dilatation or surrounding inflammatory changes. Spleen: Normal in size without focal abnormality. Adrenals/Urinary Tract: Stable probable left adrenal adenoma. Right adrenal gland is unremarkable. No hydronephrosis or renal obstruction is noted. No renal or ureteral calculi are noted. Urinary bladder is unremarkable. Stomach/Bowel: Stomach is unremarkable. Stool is noted throughout the colon. The appendix is not visualized. There is no evidence of bowel obstruction or inflammation. Moderate amount of stool is noted in the rectum concerning for impaction. Vascular/Lymphatic: Aortic atherosclerosis. No enlarged abdominal or pelvic lymph nodes. Reproductive: Status post hysterectomy. 4.5 x 4.0 cm triangular-shaped fluid collection is seen in left adnexal  region which is not significantly changed compared to prior exam. Other: No abdominal wall hernia or abnormality. No abdominopelvic ascites. Musculoskeletal: Status post surgical fusion of L4-5. No acute osseous abnormality is noted. Mildly increased inflammatory stranding is seen lateral to the proximal left femur which may represent cellulitis or potentially trochanteric bursitis. IMPRESSION: Moderate amount of stool seen in rectum concerning for impaction. Grossly stable 4.5 x 5.4 cm triangular-shaped fluid collection seen in left adnexal region concerning for adnexal cystic lesion. This is not considered to be likely clinically significant. Stable probable left adrenal adenoma. Mildly increased inflammatory stranding is seen in the soft tissues lateral to the proximal left femur which may represent cellulitis or potentially trochanteric bursitis. Aortic Atherosclerosis (ICD10-I70.0). Electronically Signed   By: Lupita Raider M.D.   On: 05/01/2023 15:30   CT Head Wo Contrast  Result Date: 05/01/2023 CLINICAL DATA:  Mental status change, unknown cause. EXAM: CT HEAD WITHOUT CONTRAST TECHNIQUE: Contiguous axial images were obtained from the base of the skull through the vertex without intravenous contrast. RADIATION DOSE REDUCTION: This exam was performed according to the departmental dose-optimization program which includes automated exposure control, adjustment of the mA and/or kV according to patient size and/or use of iterative reconstruction technique. COMPARISON:  Head CT 08/13/2022. FINDINGS: Brain: No acute hemorrhage. Unchanged moderate chronic small-vessel disease. Cortical gray-white differentiation is otherwise preserved. Prominence of the ventricles and sulci within expected range for age. No hydrocephalus or extra-axial collection. No mass effect or midline shift. Vascular: No hyperdense vessel or unexpected calcification. Skull: No calvarial fracture or suspicious bone lesion. Skull base is  unremarkable. Sinuses/Orbits: Unremarkable. Other: None. IMPRESSION: 1. No acute intracranial abnormality. 2. Unchanged moderate chronic small-vessel disease. Electronically Signed   By: Orvan Falconer M.D.   On: 05/01/2023 15:18   DG Chest Portable 1 View  Result Date: 05/01/2023 CLINICAL DATA:  Two day history of weakness, decreased appetite, and lethargy EXAM: PORTABLE CHEST 1 VIEW COMPARISON:  Chest radiograph dated 02/10/2023 FINDINGS: Normal lung volumes. No focal consolidations. No pleural effusion or pneumothorax. The heart size and mediastinal contours are within normal limits. No acute osseous abnormality. Right humeral bone anchors. IMPRESSION: No active disease. Electronically Signed   By: Agustin Cree M.D.   On: 05/01/2023 12:03       Assessment & Plan:    Principal Problem:  UTI (urinary tract infection) Active Problems:   Acute metabolic encephalopathy   Controlled type 2 diabetes with neuropathy (HCC)   Hyperlipidemia   Diabetic neuropathy associated with type 2 diabetes mellitus (HCC)   Chronic constipation   Aortic atherosclerosis (HCC)   Emphysema of lung (HCC)   CAD (coronary artery disease)   Failure to thrive/deconditioning Acute metabolic encephalopathy -Is in the setting of acute infection related to UTI, will start on gentle hydration, treat her UTI, consult PT/OT -CT head with no acute findings  UTI -Continue with IV Rocephin, follow on urine cultures  Diabetes mellitus, type II -Metformin, will monitor CBG without any insulin coverage for now given oral intake is unreliable, and recent admission for hypoglycemia.   Hypokalemia -Repleted, recheck in a.m.   Essential hypertension -Continue with metoprolol   Depression -Continue zoloft   Constipation/fecal impaction -Exam per ED physician reports soft stools in the rectal vault, will give smog enema, scheduled suppositories and laxatives    CAD (coronary artery disease) -Continue asa, beta blocker,  statin, Brilinta    Hyperlipidemia -Continue statin   Overactive bladder-resolved as of 02/10/2023 -Continue rapaflo   DVT Prophylaxis Heparin  AM Labs Ordered, also please review Full Orders  Family Communication: Admission, patients condition and plan of care including tests being ordered have been discussed with the patient and DAUGHTER by phone who indicate understanding and agree with the plan and Code Status.  Code Status DNR, confirmed by daughter  Likely DC to  home  Condition GUARDED    Consults called: none    Admission status: observation    Time spent in minutes : 70 minutes   Huey Bienenstock M.D on 05/01/2023 at 10:25 PM   [facility] - Office  [phone]

## 2023-05-01 NOTE — ED Provider Notes (Addendum)
[place] EMERGENCY DEPARTMENT AT [facility] Provider Note   CSN: [id number] Arrival date & time: 05/01/23  1019     History  Chief Complaint  Patient presents with   Weakness    [name] is a 89 y.o. female.  HPI 89 year old female presents with generalized weakness and poor appetite.  History is primarily from the daughter.  Patient has not been doing well for the past 3 days.  Has been barely eating, not getting out of bed, and getting weaker.  She has had prior strokes before and the daughter did feel like she was having a little bit of a hard time getting her words out when she was talking to her earlier.  She has also had vomiting once or twice over the last couple days. No cough or fevers reported. Patient endorses some lower abdominal pain when asked.  She has a history of memory issues but seems more confused today.  Home Medications Prior to Admission medications   Medication Sig Start Date End Date Taking? Authorizing Provider  acetaminophen (TYLENOL) 500 MG tablet Take 500 mg by mouth every 6 (six) hours as needed for mild pain.    [provider]  aspirin EC 81 MG tablet Take 1 tablet (81 mg total) by mouth daily. Swallow whole. 01/05/23   Dunn, Tacey Ruiz, PA-C  blood glucose meter kit and supplies KIT Dispense based on patient and insurance preference. Use to TEST BLOOD GLUCOSE THREE times daily as directed. DX E11.65 02/10/21   Roma Kayser, MD  Cholecalciferol (VITAMIN D3) 5000 units CAPS Take 1 capsule (5,000 Units total) by mouth daily. 04/15/17   Roma Kayser, MD  diclofenac Sodium (VOLTAREN ARTHRITIS PAIN) 1 % GEL Apply 2 g topically 3 (three) times daily. bilateral knees for osteoarthritis 07/25/22   Sharee Holster, NP  gabapentin (NEURONTIN) 300 MG capsule Take 300 mg three times daily 07/25/22   Sharee Holster, NP  Insulin Pen Needle (BD AUTOSHIELD DUO) 30G X 5 MM MISC by Does not apply route as directed. 30 gauge X 3/16     [provider]  losartan (COZAAR) 25 MG tablet Take 1 tablet (25 mg total) by mouth daily. 01/05/23   Dunn, Tacey Ruiz, PA-C  magnesium oxide (MAG-OX) 400 (240 Mg) MG tablet Take 200 mg by mouth daily.    [provider]  metFORMIN (GLUCOPHAGE) 500 MG tablet Take 1 tablet (500 mg total) by mouth daily. 02/13/23 03/15/23  Sherryll Burger, Pratik D, DO  metoprolol tartrate (LOPRESSOR) 25 MG tablet Take 1 tablet (25 mg total) by mouth 2 (two) times daily. 01/04/23   Dunn, Tacey Ruiz, PA-C  nitroGLYCERIN (NITROSTAT) 0.4 MG SL tablet Place 1 tablet (0.4 mg total) under the tongue every 5 (five) minutes as needed for chest pain (up to 3 doses. If taking 3rd dose, call 911). 01/04/23   Dunn, Tacey Ruiz, PA-C  rosuvastatin (CRESTOR) 20 MG tablet Take 1 tablet (20 mg total) by mouth daily. 01/05/23   Dunn, Tacey Ruiz, PA-C  sertraline (ZOLOFT) 25 MG tablet Take 25 mg by mouth daily. Take with 50mg  dose to equal 75mg  daily.    [provider]  sertraline (ZOLOFT) 50 MG tablet Take 50 mg by mouth daily. Take with 25mg  dose to equal 75mg  daily.    [provider]  silodosin (RAPAFLO) 8 MG CAPS capsule Take 1 capsule (8 mg total) by mouth daily with breakfast. 10/10/22   Summerlin, Regan Rakers, PA-C  solifenacin (  VESICARE) 10 MG tablet Take 1 tablet (10 mg total) by mouth daily. 02/26/23   McKenzie, Mardene Celeste, MD  ticagrelor (BRILINTA) 90 MG TABS tablet Take 1 tablet (90 mg total) by mouth 2 (two) times daily. 01/04/23   Dunn, Tacey Ruiz, PA-C  zolpidem (AMBIEN) 10 MG tablet Take 10 mg by mouth at bedtime as needed for sleep. 01/22/23   [provider]      Allergies    Codeine, Glipizide, and Sulfa antibiotics    Review of Systems   Review of Systems  Unable to perform ROS: Dementia    Physical Exam Updated Vital Signs BP (!) 159/79   Pulse 85   Temp 97.9 F (36.6 C) (Oral)   Resp 17   SpO2 98%  Physical Exam Vitals and nursing note reviewed.  Constitutional:      Appearance:  She is well-developed.  HENT:     Head: Normocephalic and atraumatic.     Mouth/Throat:     Mouth: Mucous membranes are dry.  Eyes:     Pupils: Pupils are equal, round, and reactive to light.  Cardiovascular:     Rate and Rhythm: Normal rate and regular rhythm.     Heart sounds: Normal heart sounds.  Pulmonary:     Effort: Pulmonary effort is normal.     Breath sounds: Normal breath sounds.  Abdominal:     Palpations: Abdomen is soft.     Tenderness: There is abdominal tenderness (lower abdomen).  Skin:    General: Skin is warm and dry.  Neurological:     Mental Status: She is alert.     Comments: Equal strength in all 4 extremities. No slurred speech or trouble articulating     ED Results / Procedures / Treatments   Labs (all labs ordered are listed, but only abnormal results are displayed) Labs Reviewed  COMPREHENSIVE METABOLIC PANEL - Abnormal; Notable for the following components:      Result Value   Potassium 3.1 (*)    Glucose, Bld 230 (*)    Albumin 3.2 (*)    Total Bilirubin 1.4 (*)    Anion gap 16 (*)    All other components within normal limits  URINALYSIS, W/ REFLEX TO CULTURE (INFECTION SUSPECTED) - Abnormal; Notable for the following components:   Color, Urine AMBER (*)    APPearance CLOUDY (*)    Glucose, UA 150 (*)    Hgb urine dipstick SMALL (*)    Ketones, ur 80 (*)    Protein, ur 100 (*)    Leukocytes,Ua LARGE (*)    Bacteria, UA RARE (*)    All other components within normal limits  TROPONIN I (HIGH SENSITIVITY) - Abnormal; Notable for the following components:   Troponin I (High Sensitivity) 40 (*)    All other components within normal limits  URINE CULTURE  CBC WITH DIFFERENTIAL/PLATELET    EKG None  Radiology CT Head Wo Contrast  Result Date: 05/01/2023 CLINICAL DATA:  Mental status change, unknown cause. EXAM: CT HEAD WITHOUT CONTRAST TECHNIQUE: Contiguous axial images were obtained from the base of the skull through the vertex without  intravenous contrast. RADIATION DOSE REDUCTION: This exam was performed according to the departmental dose-optimization program which includes automated exposure control, adjustment of the mA and/or kV according to patient size and/or use of iterative reconstruction technique. COMPARISON:  Head CT 08/13/2022. FINDINGS: Brain: No acute hemorrhage. Unchanged moderate chronic small-vessel disease. Cortical gray-white differentiation is otherwise preserved. Prominence of the ventricles and sulci  within expected range for age. No hydrocephalus or extra-axial collection. No mass effect or midline shift. Vascular: No hyperdense vessel or unexpected calcification. Skull: No calvarial fracture or suspicious bone lesion. Skull base is unremarkable. Sinuses/Orbits: Unremarkable. Other: None. IMPRESSION: 1. No acute intracranial abnormality. 2. Unchanged moderate chronic small-vessel disease. Electronically Signed   By: Orvan Falconer M.D.   On: 05/01/2023 15:18   DG Chest Portable 1 View  Result Date: 05/01/2023 CLINICAL DATA:  Two day history of weakness, decreased appetite, and lethargy EXAM: PORTABLE CHEST 1 VIEW COMPARISON:  Chest radiograph dated 02/10/2023 FINDINGS: Normal lung volumes. No focal consolidations. No pleural effusion or pneumothorax. The heart size and mediastinal contours are within normal limits. No acute osseous abnormality. Right humeral bone anchors. IMPRESSION: No active disease. Electronically Signed   By: Agustin Cree M.D.   On: 05/01/2023 12:03    Procedures Ultrasound ED Peripheral IV (Provider)  Date/Time: 05/01/2023 3:32 PM  Performed by: Pricilla Loveless, MD Authorized by: Pricilla Loveless, MD   Procedure details:    Indications: multiple failed IV attempts and poor IV access     Skin Prep: chlorhexidine gluconate     Location:  Right AC   Angiocath:  20 G   Bedside Ultrasound Guided: Yes     Patient tolerated procedure without complications: Yes     Dressing applied: Yes        Medications Ordered in ED Medications  cefTRIAXone (ROCEPHIN) 1 g in sodium chloride 0.9 % 100 mL IVPB (has no administration in time range)  lactated ringers bolus 500 mL (500 mLs Intravenous New Bag/Given 05/01/23 1430)  iohexol (OMNIPAQUE) 300 MG/ML solution 80 mL (80 mLs Intravenous Contrast Given 05/01/23 1509)    ED Course/ Medical Decision Making/ A&P                             Medical Decision Making Amount and/or Complexity of Data Reviewed Labs: ordered. Radiology: ordered.  Risk Prescription drug management.   Lab work is overall unremarkable besides some mild hypokalemia.  However the urine does show UTI.  Given the lower abdominal pain and vomiting a CT was obtained and is currently pending.  Care transferred to Dr. Particia Nearing.  Rocephin started.   Addendum: I performed a rectal exam and there is mushy stool but no actual impaction.  I think she can have bowel care including an enema but does not need of manual disimpaction.  Also looked at her left hip/greater trochanter region and there is no obvious cellulitis.     Final Clinical Impression(s) / ED Diagnoses Final diagnoses:  None    Rx / DC Orders ED Discharge Orders     None         Pricilla Loveless, MD 05/01/23 1610    Pricilla Loveless, MD 05/01/23 1539

## 2023-05-01 NOTE — ED Triage Notes (Signed)
[facility] reports pt coming from home. Aide states pt for the past two days has had weakness, decreased appetite and lethargy.

## 2023-05-01 NOTE — ED Provider Notes (Signed)
Pt signed out by Dr. Criss Alvine pending hospitalist call back.  Pt d/w Dr. Randol Kern ([facility]) who will admit.   Jacalyn Lefevre, MD 05/01/23 [phone]

## 2023-05-02 DIAGNOSIS — E1140 Type 2 diabetes mellitus with diabetic neuropathy, unspecified: Secondary | ICD-10-CM | POA: Diagnosis present

## 2023-05-02 DIAGNOSIS — F32A Depression, unspecified: Secondary | ICD-10-CM | POA: Diagnosis present

## 2023-05-02 DIAGNOSIS — F0392 Unspecified dementia, unspecified severity, with psychotic disturbance: Secondary | ICD-10-CM

## 2023-05-02 DIAGNOSIS — N390 Urinary tract infection, site not specified: Secondary | ICD-10-CM | POA: Diagnosis present

## 2023-05-02 DIAGNOSIS — G9341 Metabolic encephalopathy: Secondary | ICD-10-CM | POA: Diagnosis present

## 2023-05-02 DIAGNOSIS — K5641 Fecal impaction: Secondary | ICD-10-CM | POA: Diagnosis present

## 2023-05-02 DIAGNOSIS — I5023 Acute on chronic systolic (congestive) heart failure: Secondary | ICD-10-CM | POA: Diagnosis not present

## 2023-05-02 DIAGNOSIS — E785 Hyperlipidemia, unspecified: Secondary | ICD-10-CM | POA: Diagnosis present

## 2023-05-02 DIAGNOSIS — J439 Emphysema, unspecified: Secondary | ICD-10-CM | POA: Diagnosis present

## 2023-05-02 DIAGNOSIS — B9689 Other specified bacterial agents as the cause of diseases classified elsewhere: Secondary | ICD-10-CM | POA: Diagnosis present

## 2023-05-02 DIAGNOSIS — T502X5A Adverse effect of carbonic-anhydrase inhibitors, benzothiadiazides and other diuretics, initial encounter: Secondary | ICD-10-CM | POA: Diagnosis present

## 2023-05-02 DIAGNOSIS — Z66 Do not resuscitate: Secondary | ICD-10-CM | POA: Diagnosis present

## 2023-05-02 DIAGNOSIS — I700 Atherosclerosis of aorta: Secondary | ICD-10-CM | POA: Diagnosis present

## 2023-05-02 DIAGNOSIS — F03A3 Unspecified dementia, mild, with mood disturbance: Secondary | ICD-10-CM | POA: Diagnosis present

## 2023-05-02 DIAGNOSIS — E860 Dehydration: Secondary | ICD-10-CM | POA: Diagnosis present

## 2023-05-02 DIAGNOSIS — I110 Hypertensive heart disease with heart failure: Secondary | ICD-10-CM | POA: Diagnosis present

## 2023-05-02 DIAGNOSIS — F03918 Unspecified dementia, unspecified severity, with other behavioral disturbance: Secondary | ICD-10-CM | POA: Diagnosis not present

## 2023-05-02 DIAGNOSIS — Z7984 Long term (current) use of oral hypoglycemic drugs: Secondary | ICD-10-CM | POA: Diagnosis not present

## 2023-05-02 DIAGNOSIS — K5900 Constipation, unspecified: Secondary | ICD-10-CM | POA: Diagnosis not present

## 2023-05-02 DIAGNOSIS — E876 Hypokalemia: Secondary | ICD-10-CM | POA: Diagnosis present

## 2023-05-02 DIAGNOSIS — I5032 Chronic diastolic (congestive) heart failure: Secondary | ICD-10-CM | POA: Diagnosis present

## 2023-05-02 DIAGNOSIS — Z7982 Long term (current) use of aspirin: Secondary | ICD-10-CM | POA: Diagnosis not present

## 2023-05-02 DIAGNOSIS — I252 Old myocardial infarction: Secondary | ICD-10-CM | POA: Diagnosis not present

## 2023-05-02 DIAGNOSIS — A498 Other bacterial infections of unspecified site: Secondary | ICD-10-CM | POA: Diagnosis not present

## 2023-05-02 DIAGNOSIS — E1142 Type 2 diabetes mellitus with diabetic polyneuropathy: Secondary | ICD-10-CM | POA: Diagnosis not present

## 2023-05-02 DIAGNOSIS — Z7902 Long term (current) use of antithrombotics/antiplatelets: Secondary | ICD-10-CM | POA: Diagnosis not present

## 2023-05-02 DIAGNOSIS — N3000 Acute cystitis without hematuria: Secondary | ICD-10-CM | POA: Diagnosis not present

## 2023-05-02 DIAGNOSIS — E1165 Type 2 diabetes mellitus with hyperglycemia: Secondary | ICD-10-CM | POA: Diagnosis present

## 2023-05-02 DIAGNOSIS — K5909 Other constipation: Secondary | ICD-10-CM | POA: Diagnosis not present

## 2023-05-02 DIAGNOSIS — I2510 Atherosclerotic heart disease of native coronary artery without angina pectoris: Secondary | ICD-10-CM | POA: Diagnosis present

## 2023-05-02 DIAGNOSIS — R627 Adult failure to thrive: Secondary | ICD-10-CM | POA: Diagnosis present

## 2023-05-02 DIAGNOSIS — E782 Mixed hyperlipidemia: Secondary | ICD-10-CM | POA: Diagnosis not present

## 2023-05-02 DIAGNOSIS — Z8249 Family history of ischemic heart disease and other diseases of the circulatory system: Secondary | ICD-10-CM | POA: Diagnosis not present

## 2023-05-02 DIAGNOSIS — R531 Weakness: Secondary | ICD-10-CM | POA: Diagnosis present

## 2023-05-02 LAB — URINE CULTURE

## 2023-05-02 LAB — CBC
HCT: 39.5 % (ref 36.0–46.0)
Hemoglobin: 13.2 g/dL (ref 12.0–15.0)
MCH: 28.1 pg (ref 26.0–34.0)
MCHC: 33.4 g/dL (ref 30.0–36.0)
MCV: 84 fL (ref 80.0–100.0)
Platelets: 258 10*3/uL (ref 150–400)
RBC: 4.7 MIL/uL (ref 3.87–5.11)
RDW: 13.3 % (ref 11.5–15.5)
WBC: 10.1 10*3/uL (ref 4.0–10.5)
nRBC: 0 % (ref 0.0–0.2)

## 2023-05-02 LAB — BASIC METABOLIC PANEL
Anion gap: 15 (ref 5–15)
BUN: 13 mg/dL (ref 8–23)
CO2: 21 mmol/L — ABNORMAL LOW (ref 22–32)
Calcium: 8.7 mg/dL — ABNORMAL LOW (ref 8.9–10.3)
Chloride: 98 mmol/L (ref 98–111)
Creatinine, Ser: 0.69 mg/dL (ref 0.44–1.00)
GFR, Estimated: >60 mL/min (ref >60)
Glucose, Bld: 195 mg/dL — ABNORMAL HIGH (ref 70–99)
Potassium: 2.6 mmol/L — CL (ref 3.5–5.1)
Sodium: 134 mmol/L — ABNORMAL LOW (ref 135–145)

## 2023-05-02 LAB — HEMOGLOBIN A1C
Hgb A1c MFr Bld: 8.5 % — ABNORMAL HIGH (ref 4.8–5.6)
Mean Plasma Glucose: 197.25 mg/dL

## 2023-05-02 LAB — GLUCOSE, CAPILLARY
Glucose-Capillary: 197 mg/dL — ABNORMAL HIGH (ref 70–99)
Glucose-Capillary: 275 mg/dL — ABNORMAL HIGH (ref 70–99)
Glucose-Capillary: 152 mg/dL — ABNORMAL HIGH (ref 70–99)
Glucose-Capillary: 268 mg/dL — ABNORMAL HIGH (ref 70–99)

## 2023-05-02 LAB — BRAIN NATRIURETIC PEPTIDE: B Natriuretic Peptide: 553 pg/mL — ABNORMAL HIGH (ref 0.0–100.0)

## 2023-05-02 LAB — MAGNESIUM: Magnesium: 1.7 mg/dL (ref 1.7–2.4)

## 2023-05-02 LAB — PROCALCITONIN: Procalcitonin: <0.1 ng/mL

## 2023-05-02 MED ORDER — POTASSIUM CHLORIDE 20 MEQ PO PACK
40.0000 meq | PACK | Freq: Once | ORAL | Status: AC
  Administered 2023-05-02: 40 meq via ORAL
  Filled 2023-05-02: qty 2

## 2023-05-02 MED ORDER — INSULIN ASPART 100 UNIT/ML IJ SOLN
0.0000 [IU] | Freq: Every day | INTRAMUSCULAR | Status: DC
  Administered 2023-05-02: 3 [IU] via SUBCUTANEOUS

## 2023-05-02 MED ORDER — ONDANSETRON HCL 4 MG/2ML IJ SOLN
4.0000 mg | Freq: Four times a day (QID) | INTRAMUSCULAR | Status: DC | PRN
  Administered 2023-05-02: 4 mg via INTRAVENOUS
  Filled 2023-05-02: qty 2

## 2023-05-02 MED ORDER — SODIUM CHLORIDE 0.9 % IV SOLN: INTRAVENOUS | Status: DC | PRN

## 2023-05-02 MED ORDER — FUROSEMIDE 10 MG/ML IJ SOLN
20.0000 mg | Freq: Two times a day (BID) | INTRAMUSCULAR | Status: DC
  Administered 2023-05-02 – 2023-05-03 (×3): 20 mg via INTRAVENOUS
  Filled 2023-05-02 (×4): qty 2

## 2023-05-02 MED ORDER — POTASSIUM CHLORIDE CRYS ER 20 MEQ PO TBCR
40.0000 meq | EXTENDED_RELEASE_TABLET | Freq: Once | ORAL | Status: AC
  Administered 2023-05-02: 40 meq via ORAL
  Filled 2023-05-02: qty 2

## 2023-05-02 MED ORDER — INSULIN ASPART 100 UNIT/ML IJ SOLN
0.0000 [IU] | Freq: Three times a day (TID) | INTRAMUSCULAR | Status: DC
  Administered 2023-05-02: 2 [IU] via SUBCUTANEOUS
  Administered 2023-05-02: 5 [IU] via SUBCUTANEOUS
  Administered 2023-05-03: 7 [IU] via SUBCUTANEOUS
  Administered 2023-05-03 (×2): 2 [IU] via SUBCUTANEOUS
  Administered 2023-05-04: 3 [IU] via SUBCUTANEOUS
  Administered 2023-05-04: 2 [IU] via SUBCUTANEOUS
  Administered 2023-05-04: 3 [IU] via SUBCUTANEOUS
  Administered 2023-05-05: 2 [IU] via SUBCUTANEOUS
  Administered 2023-05-05: 3 [IU] via SUBCUTANEOUS

## 2023-05-02 NOTE — Progress Notes (Signed)
[facility] Progress Note  Patient: [name]    ZOX:[id number]  DOA: 05/01/2023    Date of Service: the patient was seen and examined on 05/02/2023  Brief hospital course: Patient is a 89 year old female with past medical history of hypertension, CVA, urinary incontinence, mild senile dementia and diabetes mellitus brought into the emergency room on 5/8 with increased confusion, generalized weakness and failure to thrive over the past few days.  Patient's daughter related she had had similar presentations when she had had urinary tract infections in the past.  The emergency room, workup revealed significant fecal impaction and large urinary tract infection.  Patient admitted to the hospitalist service for further evaluation.   Assessment and Plan: Senile dementia with acute behavioral disturbance/acute metabolic encephalopathy secondary to urinary tract infection: On IV Rocephin.  Gentle IV fluids.  Awaiting culture/sensitivities.  PT and OT to see for deconditioning.  CT scan of head on admission unremarkable.   Diabetes mellitus, type II Recheck A1c, last 1 at 6.8 in February.  Now with elevated blood sugars ranging from the 180s to 270s.  Cover with sliding scale.   Hypokalemia Although replaced, worse this morning at 2.6.  Replacing accordingly.  Magnesium level normal.   Essential hypertension -Continue with metoprolol   Depression -Continue zoloft   Constipation/fecal impaction Patient ordered smog enema, scheduled suppositories and laxatives    CAD (coronary artery disease) -Continue asa, beta blocker, statin, Brilinta   Acute on chronic diastolic heart failure Echocardiogram in January 2024 notes grade 1 diastolic dysfunction.  BNP ordered and came back elevated at 553.  Will start patient on IV Lasix  Hyperlipidemia -Continue statin   Overactive bladder-resolved as of 02/10/2023 -Continue rapaflo      Body mass index is 23.43 kg/m.      Consultants: None  Procedures: None  Antimicrobials: IV Rocephin 5/8-present  Code Status: DNR   Subjective: Patient denies any pain or other complaints.  However still confused.  Objective: Noted elevated blood pressures Vitals:   05/01/23 2300 05/02/23 0343  BP: (!) 160/93 (!) 158/88  Pulse: 86 78  Resp: 18 18  Temp: 97.6 F (36.4 C) 98 F (36.7 C)  SpO2: 98% 97%    Intake/Output Summary (Last 24 hours) at 05/02/2023 1123 Last data filed at 05/02/2023 0700 Gross per 24 hour  Intake 1301.53 ml  Output 500 ml  Net 801.53 ml   Filed Weights   05/01/23 2019  Weight: 69.9 kg   Body mass index is 23.43 kg/m.  Exam:  General: Oriented x 1 HEENT: Normocephalic and atraumatic, mucous membranes slightly dry Cardiovascular: Regular rate and rhythm, S1-S2 Respiratory: Clear to auscultation bilaterally Abdomen: Soft, nontender, nondistended, positive bowel sounds Musculoskeletal: No clubbing or cyanosis or edema Skin: No skin breaks, tears or lesions Psychiatry: Underlying dementia, acutely confused Neurology: No focal deficits  Data Reviewed: Sodium of 134, potassium of 2.6.  Disposition:  Status is: Observation     Anticipated discharge date: 5/11  Remaining issues to be resolved so that patient can be discharged:  -Improvement in mentation -Treatment of UTI -Diuresing   Family Communication: Will update daughter DVT Prophylaxis: heparin injection 5,000 Units Start: 05/01/23 2200    Author: Hollice Espy ,MD 05/02/2023 11:23 AM  To reach On-call, see care teams to locate the attending and reach out via [email or web address]. Between 7PM-7AM, please contact night-coverage If you still have difficulty reaching the attending provider, please page the [facility] (Director on Call) for [facility] on amion for  assistance.

## 2023-05-02 NOTE — Inpatient Diabetes Management (Signed)
Inpatient Diabetes Program Recommendations  AACE/ADA: New Consensus Statement on Inpatient Glycemic Control (2015)  Target Ranges:  Prepandial:   less than 140 mg/dL      Peak postprandial:   less than 180 mg/dL (1-2 hours)      Critically ill patients:  140 - 180 mg/dL   Lab Results  Component Value Date   GLUCAP 197 (H) 05/02/2023   HGBA1C 6.8 (H) 02/11/2023    Review of Glycemic Control  Latest Reference Range & Units 05/01/23 21:06 05/02/23 07:21  Glucose-Capillary 70 - 99 mg/dL 161 (H) 096 (H)  (H): Data is abnormally high  Diabetes history: DM2 Outpatient Diabetes medications: Metformin 500 mg QD Current orders for Inpatient glycemic control: CBG's  Inpatient Diabetes Program Recommendations:    Please consider:  Novolog 0-9 units TID  Will continue to follow while inpatient.  Thank you, Dulce Sellar, MSN, CDCES Diabetes Coordinator Inpatient Diabetes Program [phone] (team pager from 8a-5p)

## 2023-05-02 NOTE — Progress Notes (Signed)
Had very large, brown bm after enema. Husband has been at bedside most of day

## 2023-05-02 NOTE — Progress Notes (Signed)
Pt needed assist with breakfast tray this morning as well as verbal instructions once food was handed to her. Pt is still very confused and did not have much of an appetite.

## 2023-05-02 NOTE — Plan of Care (Signed)
  Problem: Acute Rehab PT Goals(only PT should resolve) Goal: Pt will Roll Supine to Side Outcome: Progressing Flowsheets (Taken 05/02/2023 1530) Pt will Roll Supine to Side:  with min assist  with supervision Goal: Pt Will Go Supine/Side To Sit Outcome: Progressing Flowsheets (Taken 05/02/2023 1530) Pt will go Supine/Side to Sit:  with min guard assist  with minimal assist Goal: Pt Will Go Sit To Supine/Side Outcome: Progressing Flowsheets (Taken 05/02/2023 1530) Pt will go Sit to Supine/Side:  with min guard assist  with minimal assist Goal: Patient Will Transfer Sit To/From Stand Outcome: Progressing Flowsheets (Taken 05/02/2023 1530) Patient will transfer sit to/from stand: with minimal assist Goal: Pt Will Perform Standing Balance Or Pre-Gait Outcome: Progressing Flowsheets (Taken 05/02/2023 1530) Pt will perform standing balance or pre-gait:  with minimal assist  with moderate assist   3:30 PM, 05/02/23 Ocie Bob, MPT Physical Therapist with [facility] [phone] office [phone] mobile phone

## 2023-05-02 NOTE — TOC Initial Note (Signed)
Transition of Care [facility]) - Initial/Assessment Note    Patient Details  Name: [name] MRN: [id number] Date of Birth: 11/15/1929  [facility] CM/SW Contact:    Villa Herb, LCSWA Phone Number: 05/02/2023, 11:43 AM  Clinical Narrative:                 TOC updated that PT is recommending SNF for pt at D/C. CSW spoke with pts daughter due to pts confusion. Pts daughter states they have been through this many times and she knows her mother will not want SNF and that pts spouse will agree with what pt wants. Pts daughter states not to start SNF referral as pt will not want this. Pts daughter agreeable to [facility] PT/OT referral and does not have agency preference. TOC to follow.   Expected Discharge Plan: Home w Home Health Services Barriers to Discharge: Continued Medical Work up   Patient Goals and CMS Choice Patient states their goals for this hospitalization and ongoing recovery are:: return home CMS Medicare.gov Compare Post Acute Care list provided to:: Patient Choice offered to / list presented to : Patient      Expected Discharge Plan and Services In-house Referral: Clinical Social Work Discharge Planning Services: CM Consult Post Acute Care Choice: Home Health Living arrangements for the past 2 months: Single Family Home                                      Prior Living Arrangements/Services Living arrangements for the past 2 months: Single Family Home Lives with:: Spouse Patient language and need for interpreter reviewed:: Yes Do you feel safe going back to the place where you live?: Yes      Need for Family Participation in Patient Care: Yes (Comment) Care giver support system in place?: Yes (comment)   Criminal Activity/Legal Involvement Pertinent to Current Situation/Hospitalization: No - Comment as needed  Activities of Daily Living      Permission Sought/Granted                  Emotional Assessment Appearance:: Appears  stated age     Orientation: : Oriented to Self Alcohol / Substance Use: Not Applicable Psych Involvement: No (comment)  Admission diagnosis:  Dehydration [E86.0] Hypokalemia [E87.6] UTI (urinary tract infection) [N39.0] Failure to thrive in adult [R62.7] Acute cystitis without hematuria [N30.00] Constipation, unspecified constipation type [K59.00] Acute metabolic encephalopathy [G93.41] Patient Active Problem List   Diagnosis Date Noted   Hypoglycemia 02/10/2023   Chest pain 02/10/2023   Depression 02/10/2023   CAD (coronary artery disease) 01/04/2023   Pressure injury of skin 01/04/2023   Physical debility 01/04/2023   Leukocytosis 01/04/2023   Acute ST elevation myocardial infarction (STEMI) of inferior wall (HCC) 12/31/2022   SARS-CoV-2 positive 08/24/2022   SARS-CoV-2 antibody positive 08/23/2022   Emphysema of lung (HCC) 08/21/2022   Vascular dementia without behavioral disturbance (HCC) 08/21/2022   Decubitus ulcer of buttock, stage 2 (HCC) 08/13/2022   Hypomagnesemia 08/03/2022   Hypoglycemia associated with diabetes (HCC) 08/02/2022   Hypokalemia 08/02/2022   Poorly controlled type 2 diabetes mellitus with neuropathy (HCC) 07/19/2022   Diabetic neuropathy associated with type 2 diabetes mellitus (HCC) 07/16/2022   Hypertension associated with type 2 diabetes mellitus (HCC) 07/16/2022   Hyperlipidemia associated with type 2 diabetes mellitus (HCC) 07/16/2022   Chronic insomnia 07/16/2022   Bilateral lower extremity edema 07/16/2022  Chronic constipation 07/16/2022   Aortic atherosclerosis (HCC) 07/16/2022   Major neurocognitive disorder (HCC) 07/16/2022   DJD (degenerative joint disease) of knee 07/14/2022   UTI (urinary tract infection) 07/09/2022   Late effects of cerebrovascular disease 09/25/2021   Acute metabolic encephalopathy 07/14/2019   Cellulitis of buttock 07/14/2019   Hyperlipidemia 02/20/2016   Essential hypertension 10/10/2015   Controlled type 2  diabetes with neuropathy (HCC) 10/01/2008   PCP:  Assunta Found, MD Pharmacy:   [facility] - [place], [place] - [address]  [place] [address] Phone: [phone] Fax: [phone]  [facility] - [place], [place] [address] [place] [address] Phone: [phone] Fax: [phone]     Social Determinants of Health (SDOH) Social History: SDOH Screenings   Food Insecurity: No Food Insecurity (02/10/2023)  Housing: Low Risk  (02/10/2023)  Transportation Needs: No Transportation Needs (02/10/2023)  Utilities: Not At Risk (02/10/2023)  Physical Activity: Inactive (02/08/2019)  Social Connections: Unknown (02/08/2019)  Stress: No Stress Concern Present (02/08/2019)  Tobacco Use: Low Risk  (05/01/2023)   SDOH Interventions:     Readmission Risk Interventions    02/12/2023   11:26 AM 01/04/2023   11:03 AM  Readmission Risk Prevention Plan  Transportation Screening Complete Complete  HRI or Home Care Consult Complete   Social Work Consult for Recovery Care Planning/Counseling Complete Complete  Palliative Care Screening Not Applicable   Medication Review Oceanographer) Complete

## 2023-05-02 NOTE — Evaluation (Signed)
Physical Therapy Evaluation Patient Details Name: [name] MRN: [id number] DOB: [birth year] Today's Date: 05/02/2023  History of Present Illness  [name]  is a 89 y.o. female, with medical history significant of diabetes mellitus, hypertension, stroke, urinary incontinence, CAD, with recent MRI of January of this year, patient brought to ED by her daughter secondary to failure to thrive, generalized weakness and altered mental status, with mild dementia at baseline, daughter helps to provide the history as patient is confused, over the last 3 days, patient has been barely eating, getting weaker, more confused, communicative, this resembles previous episodes of UTI in the past, as well she had vomiting once or twice over the last couple days, no cough, no fever, no chills, patient endorses some lower abdominal pain and, but very poor historian cannot specify any more.  -In ED workup significant for pyuria, bacteriuria, low potassium at 3.1, she was afebrile, blood pressure was elevated, CT head with no acute findings, CT abdomen pelvis significant for fecal impaction, but upon rectal exam by ED patient, only soft stools noted, [facility] hospitalist consulted to admit.   Clinical Impression  Patient demonstrates slow labored movement for sitting up at bedside, once seated able to maintain sitting balance after a few minutes, but unable to scoot laterally due to weakness and required Mod/max assist  with knees blocked to transfer to chair - nursing staff notified.  Patient will benefit from continued skilled physical therapy in hospital and recommended venue below to increase strength, balance, endurance for safe ADLs and gait.         Recommendations for follow up therapy are one component of a multi-disciplinary discharge planning process, led by the attending physician.  Recommendations may be updated based on patient status, additional functional criteria and insurance  authorization.  Follow Up Recommendations Can patient physically be transported by private vehicle: No     Assistance Recommended at Discharge Intermittent Supervision/Assistance  Patient can return home with the following  A lot of help with bathing/dressing/bathroom;A lot of help with walking and/or transfers;Help with stairs or ramp for entrance;Assistance with cooking/housework    Equipment Recommendations None recommended by PT  Recommendations for Other Services       Functional Status Assessment Patient has had a recent decline in their functional status and demonstrates the ability to make significant improvements in function in a reasonable and predictable amount of time.     Precautions / Restrictions Precautions Precautions: Fall Restrictions Weight Bearing Restrictions: No      Mobility  Bed Mobility Overal bed mobility: Needs Assistance Bed Mobility: Supine to Sit     Supine to sit: Mod assist, Max assist     General bed mobility comments: increased time, labored movement    Transfers Overall transfer level: Needs assistance Equipment used: 1 person hand held assist Transfers: Sit to/from Stand, Bed to chair/wheelchair/BSC Sit to Stand: Mod assist, Max assist Stand pivot transfers: Mod assist, Max assist         General transfer comment: unsteady labored movement with knees buckling due to weakness    Ambulation/Gait                  Stairs            Wheelchair Mobility    Modified Rankin (Stroke Patients Only)       Balance Overall balance assessment: Needs assistance Sitting-balance support: Feet supported, No upper extremity supported Sitting balance-Leahy Scale: Fair Sitting balance - Comments: seated at EOB  Standing balance support: No upper extremity supported, During functional activity Standing balance-Leahy Scale: Poor Standing balance comment: Hand held assist                              Pertinent Vitals/Pain Pain Assessment Pain Assessment: Faces Faces Pain Scale: No hurt    Home Living   Living Arrangements: Spouse/significant other Available Help at Discharge: Family;Available 24 hours/day Type of Home: House Home Access: Ramped entrance     Alternate Level Stairs-Number of Steps: patient does not go upstairs Home Layout: Two level Home Equipment: Rolling Walker (2 wheels);BSC/3in1;Shower seat;Grab bars - toilet;Grab bars - tub/shower;Wheelchair - manual Additional Comments: Per chart review; pt is a poor historian.    Prior Function Prior Level of Function : Needs assist       Physical Assist : Mobility (physical);ADLs (physical) Mobility (physical): Bed mobility;Transfers ADLs (physical): IADLs Mobility Comments: non-ambulatory, stand pivot transfers Mod Independent ADLs Comments: assisted by family     Hand Dominance   Dominant Hand: Right    Extremity/Trunk Assessment   Upper Extremity Assessment Upper Extremity Assessment: Defer to OT evaluation    Lower Extremity Assessment Lower Extremity Assessment: Generalized weakness    Cervical / Trunk Assessment Cervical / Trunk Assessment: Normal  Communication   Communication: HOH;Other (comment) (slow to answer questions)  Cognition Arousal/Alertness: Awake/alert Behavior During Therapy: WFL for tasks assessed/performed Overall Cognitive Status: History of cognitive impairments - at baseline                                          General Comments      Exercises     Assessment/Plan    PT Assessment Patient needs continued PT services  PT Problem List Decreased strength;Decreased activity tolerance;Decreased balance;Decreased mobility       PT Treatment Interventions DME instruction;Functional mobility training;Therapeutic activities;Therapeutic exercise;Patient/family education;Balance training;Wheelchair mobility training    PT Goals (Current goals can be  found in the Care Plan section)  Acute Rehab PT Goals Patient Stated Goal: return home PT Goal Formulation: With patient Time For Goal Achievement: 05/16/23 Potential to Achieve Goals: Good    Frequency Min 3X/week     Co-evaluation PT/OT/SLP Co-Evaluation/Treatment: Yes Reason for Co-Treatment: To address functional/ADL transfers PT goals addressed during session: Mobility/safety with mobility;Balance         AM-PAC PT "6 Clicks" Mobility  Outcome Measure Help needed turning from your back to your side while in a flat bed without using bedrails?: A Lot Help needed moving from lying on your back to sitting on the side of a flat bed without using bedrails?: A Lot Help needed moving to and from a bed to a chair (including a wheelchair)?: A Lot Help needed standing up from a chair using your arms (e.g., wheelchair or bedside chair)?: A Lot Help needed to walk in hospital room?: Total Help needed climbing 3-5 steps with a railing? : Total 6 Click Score: 10    End of Session   Activity Tolerance: Patient tolerated treatment well Patient left: in chair;with call bell/phone within reach Nurse Communication: Mobility status PT Visit Diagnosis: Unsteadiness on feet (R26.81);Other abnormalities of gait and mobility (R26.89);Muscle weakness (generalized) (M62.81)    Time: 1610-9604 PT Time Calculation (min) (ACUTE ONLY): 10 min   Charges:   PT Evaluation $PT Eval Low Complexity: 1 Low  PT Treatments $Therapeutic Activity: 8-22 mins        3:28 PM, 05/02/23 Ocie Bob, MPT Physical Therapist with [facility] [phone] office [phone] mobile phone

## 2023-05-02 NOTE — Evaluation (Signed)
Occupational Therapy Evaluation Patient Details Name: [name] MRN: [id number] DOB: 1929/07/26 Today's Date: 05/02/2023   History of Present Illness [name]  is a 89 y.o. female, with medical history significant of diabetes mellitus, hypertension, stroke, urinary incontinence, CAD, with recent MRI of January of this year, patient brought to ED by her daughter secondary to failure to thrive, generalized weakness and altered mental status, with mild dementia at baseline, daughter helps to provide the history as patient is confused, over the last 3 days, patient has been barely eating, getting weaker, more confused, communicative, this resembles previous episodes of UTI in the past, as well she had vomiting once or twice over the last couple days, no cough, no fever, no chills, patient endorses some lower abdominal pain and, but very poor historian cannot specify any more.  -In ED workup significant for pyuria, bacteriuria, low potassium at 3.1, she was afebrile, blood pressure was elevated, CT head with no acute findings, CT abdomen pelvis significant for fecal impaction, but upon rectal exam by ED patient, only soft stools noted, [facility] hospitalist consulted to admit. (per MD)   Clinical Impression   Pt agreeable to OT and PT co-evaluation.  Pt not oriented to self, place, or year. Pt is a poor historian. Documentation indicates pt likely came from home. Today pt required mod to max A for bed mobility and transfer to the chair without AD. Assist level for ADL's listed based on cognitive deficits as well as general weakness. No family present to discuss pt's most recent level of function. Pt left in the recliner with call bell within reach. Pt will benefit from continued OT in the hospital and recommended venue below to increase strength, balance, and endurance for safe ADL's.        Recommendations for follow up therapy are one component of a multi-disciplinary discharge planning  process, led by the attending physician.  Recommendations may be updated based on patient status, additional functional criteria and insurance authorization.   Assistance Recommended at Discharge Frequent or constant Supervision/Assistance  Patient can return home with the following A lot of help with walking and/or transfers;A lot of help with bathing/dressing/bathroom;Assistance with cooking/housework;Assistance with feeding;Direct supervision/assist for medications management;Assist for transportation;Help with stairs or ramp for entrance    Functional Status Assessment  Patient has had a recent decline in their functional status and demonstrates the ability to make significant improvements in function in a reasonable and predictable amount of time.  Equipment Recommendations  None recommended by OT    Recommendations for Other Services       Precautions / Restrictions Precautions Precautions: Fall Restrictions Weight Bearing Restrictions: No      Mobility Bed Mobility Overal bed mobility: Needs Assistance Bed Mobility: Supine to Sit     Supine to sit: Mod assist, Max assist     General bed mobility comments: Labored effort; assist to move B LE to EOB and push to sit.    Transfers Overall transfer level: Needs assistance   Transfers: Sit to/from Stand, Bed to chair/wheelchair/BSC Sit to Stand: Mod assist, Max assist Stand pivot transfers: Mod assist, Max assist         General transfer comment: Labored effort; poor balance; no AD used. EOB to chair.      Balance Overall balance assessment: Needs assistance Sitting-balance support: No upper extremity supported, Feet supported Sitting balance-Leahy Scale: Fair Sitting balance - Comments: seated at EOB   Standing balance support: No upper extremity supported, During functional activity (  Therapist assiting anteriorly.) Standing balance-Leahy Scale: Poor Standing balance comment: without AD                            ADL either performed or assessed with clinical judgement   ADL Overall ADL's : Needs assistance/impaired Eating/Feeding: Moderate assistance;Minimal assistance;Sitting   Grooming: Minimal assistance;Moderate assistance;Sitting   Upper Body Bathing: Minimal assistance;Sitting   Lower Body Bathing: Maximal assistance;Sitting/lateral leans   Upper Body Dressing : Minimal assistance;Moderate assistance;Sitting   Lower Body Dressing: Maximal assistance;Sitting/lateral leans   Toilet Transfer: Moderate assistance;Maximal assistance;Stand-pivot Toilet Transfer Details (indicate cue type and reason): Simulated via EOB to chair. Toileting- Clothing Manipulation and Hygiene: Moderate assistance;Maximal assistance;Sitting/lateral lean               Vision Baseline Vision/History: 1 Wears glasses;0 No visual deficits Ability to See in Adequate Light: 0 Adequate Patient Visual Report: No change from baseline Vision Assessment?: No apparent visual deficits                Pertinent Vitals/Pain Pain Assessment Pain Assessment: Faces Faces Pain Scale: No hurt     Hand Dominance Right   Extremity/Trunk Assessment Upper Extremity Assessment Upper Extremity Assessment: Generalized weakness (Limited to near ~75% available range for shoulder flexion bilaterally. Generally weak otherwise.)   Lower Extremity Assessment Lower Extremity Assessment: Defer to PT evaluation   Cervical / Trunk Assessment Cervical / Trunk Assessment: Normal   Communication Communication Communication: HOH;Other (comment) (Possible receptive or expressive communication issues. Not consistently answering all questions.)           05/02/23 0900  Cognition  Arousal/Alertness Awake/alert  Behavior During Therapy WFL for tasks assessed/performed  Overall Cognitive Status History of cognitive impairments - at baseline                                                           Home Living Family/patient expects to be discharged to:: Private residence Living Arrangements: Spouse/significant other Available Help at Discharge: Family;Available 24 hours/day Type of Home: House Home Access: Ramped entrance     Home Layout: Two level Alternate Level Stairs-Number of Steps: patient does not go upstairs   Bathroom Shower/Tub: Producer, television/film/video: Handicapped height Bathroom Accessibility: Yes How Accessible: Accessible via wheelchair Home Equipment: Rolling Walker (2 wheels);BSC/3in1;Shower seat;Grab bars - toilet;Grab bars - tub/shower;Wheelchair - manual   Additional Comments: Per chart review; pt is a poor historian.      Prior Functioning/Environment Prior Level of Function : Needs assist       Physical Assist : Mobility (physical);ADLs (physical) Mobility (physical): Bed mobility;Transfers ADLs (physical): IADLs Mobility Comments: non-ambulatory, stand pivot transfers Mod Independent (Per chart review.) ADLs Comments: assisted by family (Per chart review.)        OT Problem List: Decreased strength;Decreased range of motion;Decreased activity tolerance;Impaired balance (sitting and/or standing);Decreased cognition;Decreased safety awareness      OT Treatment/Interventions: Self-care/ADL training;Therapeutic exercise;DME and/or AE instruction;Therapeutic activities;Cognitive remediation/compensation;Patient/family education;Balance training    OT Goals(Current goals can be found in the care plan section) Acute Rehab OT Goals Patient Stated Goal: None stated. Pt not oriented. OT Goal Formulation: With patient Time For Goal Achievement: 05/16/23 Potential to Achieve Goals: Fair  OT Frequency: Min 2X/week  Co-evaluation PT/OT/SLP Co-Evaluation/Treatment: Yes Reason for Co-Treatment: To address functional/ADL transfers   OT goals addressed during session: ADL's and self-care      AM-PAC OT "6 Clicks" Daily Activity      Outcome Measure Help from another person eating meals?: A Little Help from another person taking care of personal grooming?: A Little Help from another person toileting, which includes using toliet, bedpan, or urinal?: A Lot Help from another person bathing (including washing, rinsing, drying)?: A Lot Help from another person to put on and taking off regular upper body clothing?: A Little Help from another person to put on and taking off regular lower body clothing?: A Lot 6 Click Score: 15   End of Session    Activity Tolerance: Patient tolerated treatment well Patient left: in chair;with call bell/phone within reach;with chair alarm set  OT Visit Diagnosis: Unsteadiness on feet (R26.81);Other abnormalities of gait and mobility (R26.89);Muscle weakness (generalized) (M62.81);Other symptoms and signs involving cognitive function                Time: 8295-6213 OT Time Calculation (min): 12 min Charges:  OT General Charges $OT Visit: 1 Visit OT Evaluation $OT Eval Low Complexity: 1 Low  Coriann Brouhard OT, MOT  Danie Chandler 05/02/2023, 9:53 AM

## 2023-05-02 NOTE — Plan of Care (Signed)
  Problem: Acute Rehab OT Goals (only OT should resolve) Goal: Pt. Will Perform Upper Body Bathing Flowsheets (Taken 05/02/2023 1003) Pt Will Perform Upper Body Bathing:  with supervision  sitting Goal: Pt. Will Perform Upper Body Dressing Flowsheets (Taken 05/02/2023 1003) Pt Will Perform Upper Body Dressing:  with supervision  sitting Goal: Pt. Will Perform Lower Body Dressing Flowsheets (Taken 05/02/2023 1003) Pt Will Perform Lower Body Dressing:  with min assist  sitting/lateral leans Goal: Pt. Will Transfer To Toilet Flowsheets (Taken 05/02/2023 1003) Pt Will Transfer to Toilet:  with min assist  stand pivot transfer Goal: Pt. Will Perform Toileting-Clothing Manipulation Flowsheets (Taken 05/02/2023 1003) Pt Will Perform Toileting - Clothing Manipulation and hygiene:  with min assist  sitting/lateral leans Goal: Pt/Caregiver Will Perform Home Exercise Program Flowsheets (Taken 05/02/2023 1003) Pt/caregiver will Perform Home Exercise Program:  Increased ROM  Increased strength  Both right and left upper extremity  With minimal assist  Khayden Herzberg OT, MOT

## 2023-05-03 DIAGNOSIS — A498 Other bacterial infections of unspecified site: Secondary | ICD-10-CM

## 2023-05-03 DIAGNOSIS — N3000 Acute cystitis without hematuria: Secondary | ICD-10-CM | POA: Diagnosis not present

## 2023-05-03 DIAGNOSIS — K5909 Other constipation: Secondary | ICD-10-CM | POA: Diagnosis not present

## 2023-05-03 DIAGNOSIS — G9341 Metabolic encephalopathy: Secondary | ICD-10-CM | POA: Diagnosis not present

## 2023-05-03 LAB — URINE CULTURE: Culture: 30000 — AB

## 2023-05-03 LAB — BASIC METABOLIC PANEL
Anion gap: 11 (ref 5–15)
BUN: 16 mg/dL (ref 8–23)
CO2: 24 mmol/L (ref 22–32)
Calcium: 8.8 mg/dL — ABNORMAL LOW (ref 8.9–10.3)
Chloride: 100 mmol/L (ref 98–111)
Creatinine, Ser: 0.81 mg/dL (ref 0.44–1.00)
GFR, Estimated: >60 mL/min (ref >60)
Glucose, Bld: 175 mg/dL — ABNORMAL HIGH (ref 70–99)
Potassium: 3 mmol/L — ABNORMAL LOW (ref 3.5–5.1)
Sodium: 135 mmol/L (ref 135–145)

## 2023-05-03 LAB — GLUCOSE, CAPILLARY
Glucose-Capillary: 193 mg/dL — ABNORMAL HIGH (ref 70–99)
Glucose-Capillary: 314 mg/dL — ABNORMAL HIGH (ref 70–99)
Glucose-Capillary: 167 mg/dL — ABNORMAL HIGH (ref 70–99)
Glucose-Capillary: 155 mg/dL — ABNORMAL HIGH (ref 70–99)

## 2023-05-03 LAB — MAGNESIUM: Magnesium: 1.9 mg/dL (ref 1.7–2.4)

## 2023-05-03 MED ORDER — POTASSIUM CHLORIDE CRYS ER 20 MEQ PO TBCR
40.0000 meq | EXTENDED_RELEASE_TABLET | ORAL | Status: AC
  Administered 2023-05-03 (×2): 40 meq via ORAL
  Filled 2023-05-03 (×2): qty 2

## 2023-05-03 MED ORDER — CIPROFLOXACIN HCL 250 MG PO TABS
250.0000 mg | ORAL_TABLET | Freq: Two times a day (BID) | ORAL | Status: DC
  Administered 2023-05-03 – 2023-05-05 (×5): 250 mg via ORAL
  Filled 2023-05-03 (×5): qty 1

## 2023-05-03 NOTE — Progress Notes (Signed)
[facility] Progress Note  Patient: [name]    XBJ:[id number]  DOA: 05/01/2023    Date of Service: the patient was seen and examined on 05/03/2023  Brief hospital course: Patient is a 89 year old female with past medical history of hypertension, CVA, urinary incontinence, mild senile dementia and diabetes mellitus brought into the emergency room on 5/8 with increased confusion, generalized weakness and failure to thrive over the past few days.  Patient's daughter related she had had similar presentations when she had had urinary tract infections in the past.  The emergency room, workup revealed significant fecal impaction and large urinary tract infection.  Patient admitted to the hospitalist service for further evaluation.   Assessment and Plan: Senile dementia with acute behavioral disturbance/acute metabolic encephalopathy secondary to urinary tract infection: Initially on IV Rocephin.  Gentle IV fluids.  Urine culture grew out Enterobacter, pansensitive.  Given stability, changed over to p.o. Cipro.  CT scan of head on admission unremarkable.   Diabetes mellitus, type II Recheck A1c, last 1 at 6.8 in February.  Now with elevated blood sugars ranging from the 180s to 270s.  Cover with sliding scale.  A1C now at 8.4.  Discussed with daughter and patient was taken off of her insulin few months ago for concerns for hypoglycemia.  Given her advanced age, would not make any changes.   Hypokalemia Although replaced, worse this morning at 2.6.  Likely from bowel regimen.  Now also with Lasix.  Replacing as needed.  Essential hypertension -Continue with metoprolol   Depression -Continue zoloft   Constipation/fecal impaction Patient responded well with smog enema, scheduled suppositories and laxatives    CAD (coronary artery disease) -Continue asa, beta blocker, statin, Brilinta   Acute on chronic diastolic heart failure Echocardiogram in January 2024 notes grade 1 diastolic  dysfunction.  BNP ordered and came back elevated at 553.  Started patient on IV Lasix on 5/9, recheck labs in the morning  Hyperlipidemia -Continue statin   Overactive bladder-resolved as of 02/10/2023 -Continue rapaflo      Body mass index is 23.43 kg/m.     Consultants: None  Procedures: None  Antimicrobials: IV Rocephin 5/8-5/10 P.o. Cipro 5/10-present  Code Status: DNR   Subjective: Patient denies any pain or other complaints.  She remains detached,  Objective: Noted elevated blood pressures Vitals:   05/03/23 0922 05/03/23 1300  BP: 136/71 (!) 140/71  Pulse: 79 73  Resp: 18 20  Temp:  98.2 F (36.8 C)  SpO2: 98% 97%    Intake/Output Summary (Last 24 hours) at 05/03/2023 1510 Last data filed at 05/03/2023 0900 Gross per 24 hour  Intake 1059.11 ml  Output --  Net 1059.11 ml    Filed Weights   05/01/23 2019  Weight: 69.9 kg   Body mass index is 23.43 kg/m.  Exam:  General: Oriented x 1 HEENT: Normocephalic and atraumatic, mucous membranes moist Cardiovascular: Regular rate and rhythm, S1-S2 Respiratory: Clear to auscultation bilaterally Abdomen: Soft, nontender, nondistended, positive bowel sounds Musculoskeletal: No clubbing or cyanosis or edema Skin: No skin breaks, tears or lesions Psychiatry: Underlying dementia, flattened affect Neurology: No focal deficits  Data Reviewed: Potassium of 3.0.  BNP of 553.  Disposition:  Status is: Inpatient     Anticipated discharge date: 5/11  Remaining issues to be resolved so that patient can be discharged:  -Treatment of CHF -Family looking at long-term placement options   Family Communication: Spoke with daughter by phone DVT Prophylaxis: heparin injection 5,000 Units Start:  05/01/23 2200    Author: Hollice Espy ,MD 05/03/2023 3:10 PM  To reach On-call, see care teams to locate the attending and reach out via [email or web address]. Between 7PM-7AM, please contact night-coverage If you  still have difficulty reaching the attending provider, please page the [facility] (Director on Call) for [facility] on amion for assistance.

## 2023-05-03 NOTE — Care Management Important Message (Signed)
Important Message  Patient Details  Name: [name] MRN: [id number] Date of Birth: [birth year]   Medicare Important Message Given:  N/A - LOS <3 / Initial given by admissions     Corey Harold 05/03/2023, 10:34 AM

## 2023-05-03 NOTE — Progress Notes (Signed)
Occupational Therapy Treatment Patient Details Name: [name] MRN: [id number] DOB: 1929-03-30 Today's Date: 05/03/2023   History of present illness [name]  is a 89 y.o. female, with medical history significant of diabetes mellitus, hypertension, stroke, urinary incontinence, CAD, with recent MRI of January of this year, patient brought to ED by her daughter secondary to failure to thrive, generalized weakness and altered mental status, with mild dementia at baseline, daughter helps to provide the history as patient is confused, over the last 3 days, patient has been barely eating, getting weaker, more confused, communicative, this resembles previous episodes of UTI in the past, as well she had vomiting once or twice over the last couple days, no cough, no fever, no chills, patient endorses some lower abdominal pain and, but very poor historian cannot specify any more.  -In ED workup significant for pyuria, bacteriuria, low potassium at 3.1, she was afebrile, blood pressure was elevated, CT head with no acute findings, CT abdomen pelvis significant for fecal impaction, but upon rectal exam by ED patient, only soft stools noted, [facility] hospitalist consulted to admit.   OT comments  Pt agreeable to OT treatment. Pt getting medication from nursing staff at the start of the session. Mod to max A to sit from side lying position. Mod A to roll to L side. Max A for sit to stand and pivot to the chair without RW. Pt continues to be weak and need much assist for mobility. Pt left in the chair with chair alarm set and call bell within reach.    Recommendations for follow up therapy are one component of a multi-disciplinary discharge planning process, led by the attending physician.  Recommendations may be updated based on patient status, additional functional criteria and insurance authorization.    Assistance Recommended at Discharge Frequent or constant Supervision/Assistance  Patient can  return home with the following  A lot of help with walking and/or transfers;A lot of help with bathing/dressing/bathroom;Assistance with cooking/housework;Assistance with feeding;Direct supervision/assist for medications management;Assist for transportation;Help with stairs or ramp for entrance   Equipment Recommendations  None recommended by OT          Precautions / Restrictions Precautions Precautions: Fall Restrictions Weight Bearing Restrictions: No       Mobility Bed Mobility Overal bed mobility: Needs Assistance Bed Mobility: Rolling, Sidelying to Sit Rolling: Mod assist Sidelying to sit: Max assist, Mod assist       General bed mobility comments: increased time, labored movement    Transfers Overall transfer level: Needs assistance Equipment used: 1 person hand held assist Transfers: Sit to/from Stand, Bed to chair/wheelchair/BSC Sit to Stand: Max assist Stand pivot transfers: Max assist         General transfer comment: Unsteady; minimal weight bearing.     Balance Overall balance assessment: Needs assistance Sitting-balance support: Feet supported, No upper extremity supported Sitting balance-Leahy Scale: Fair Sitting balance - Comments: seated at EOB   Standing balance support: No upper extremity supported, During functional activity Standing balance-Leahy Scale: Poor Standing balance comment: Hand held assist                            Cognition Arousal/Alertness: Awake/alert Behavior During Therapy: WFL for tasks assessed/performed Overall Cognitive Status: History of cognitive impairments - at baseline  Pertinent Vitals/ Pain       Pain Assessment Pain Assessment: Faces Faces Pain Scale: No hurt                                                          Frequency  Min 2X/week        Progress Toward Goals  OT  Goals(current goals can now be found in the care plan section)  Progress towards OT goals: Progressing toward goals  Acute Rehab OT Goals Patient Stated Goal: None stated. Pt not oriented. OT Goal Formulation: With patient Time For Goal Achievement: 05/16/23 Potential to Achieve Goals: Fair ADL Goals Pt Will Perform Upper Body Bathing: with supervision;sitting Pt Will Perform Upper Body Dressing: with supervision;sitting Pt Will Perform Lower Body Dressing: with min assist;sitting/lateral leans Pt Will Transfer to Toilet: with min assist;stand pivot transfer Pt Will Perform Toileting - Clothing Manipulation and hygiene: with min assist;sitting/lateral leans Pt/caregiver will Perform Home Exercise Program: Increased ROM;Increased strength;Both right and left upper extremity;With minimal assist  Plan Discharge plan remains appropriate                                    End of Session    OT Visit Diagnosis: Unsteadiness on feet (R26.81);Other abnormalities of gait and mobility (R26.89);Muscle weakness (generalized) (M62.81);Other symptoms and signs involving cognitive function   Activity Tolerance Patient tolerated treatment well   Patient Left in chair;with call bell/phone within reach;with chair alarm set   Nurse Communication Other (comment) (NT present for mobility.)        Time: 1610-9604 OT Time Calculation (min): 17 min  Charges: OT General Charges $OT Visit: 1 Visit OT Treatments $Therapeutic Exercise: 8-22 mins  Hao Dion OT, MOT   Danie Chandler 05/03/2023, 9:45 AM

## 2023-05-03 NOTE — Progress Notes (Signed)
Physical Therapy Treatment Patient Details Name: [name] MRN: [id number] DOB: 30-Jun-1929 Today's Date: 05/03/2023   History of Present Illness [name]  is a 89 y.o. female, with medical history significant of diabetes mellitus, hypertension, stroke, urinary incontinence, CAD, with recent MRI of January of this year, patient brought to ED by her daughter secondary to failure to thrive, generalized weakness and altered mental status, with mild dementia at baseline, daughter helps to provide the history as patient is confused, over the last 3 days, patient has been barely eating, getting weaker, more confused, communicative, this resembles previous episodes of UTI in the past, as well she had vomiting once or twice over the last couple days, no cough, no fever, no chills, patient endorses some lower abdominal pain and, but very poor historian cannot specify any more.  -In ED workup significant for pyuria, bacteriuria, low potassium at 3.1, she was afebrile, blood pressure was elevated, CT head with no acute findings, CT abdomen pelvis significant for fecal impaction, but upon rectal exam by ED patient, only soft stools noted, [facility] hospitalist consulted to admit.    PT Comments    Patient seated in chair at beginning of session. Patient requires frequent cueing and demonstrating for performing seated exercises. She demonstrates good/fair sitting balance at edge of chair while completing. Patient did not wish to perform any further mobility and was assisted with repositioning in chair. Patient will benefit from continued skilled physical therapy in hospital and recommended venue below to increase strength, balance, endurance for safe ADLs and gait.    Recommendations for follow up therapy are one component of a multi-disciplinary discharge planning process, led by the attending physician.  Recommendations may be updated based on patient status, additional functional criteria and  insurance authorization.  Follow Up Recommendations  Can patient physically be transported by private vehicle: No    Assistance Recommended at Discharge Intermittent Supervision/Assistance  Patient can return home with the following A lot of help with bathing/dressing/bathroom;A lot of help with walking and/or transfers;Help with stairs or ramp for entrance;Assistance with cooking/housework   Equipment Recommendations  None recommended by PT    Recommendations for Other Services       Precautions / Restrictions Precautions Precautions: Fall Restrictions Weight Bearing Restrictions: No     Mobility  Bed Mobility                    Transfers                        Ambulation/Gait                   Stairs             Wheelchair Mobility    Modified Rankin (Stroke Patients Only)       Balance Overall balance assessment: Needs assistance Sitting-balance support: Feet supported, No upper extremity supported Sitting balance-Leahy Scale: Fair Sitting balance - Comments: seated at edge of chair                                    Cognition Arousal/Alertness: Awake/alert Behavior During Therapy: WFL for tasks assessed/performed Overall Cognitive Status: History of cognitive impairments - at baseline  Exercises General Exercises - Upper Extremity Shoulder Flexion: AROM, Both, 10 reps, Seated Elbow Flexion: AROM, Both, 10 reps, Seated Elbow Extension: AROM, Both, 10 reps, Seated Wrist Flexion: AROM, Both, 10 reps, Seated Wrist Extension: AROM, Both, 10 reps, Seated General Exercises - Lower Extremity Ankle Circles/Pumps: AROM, Both, 15 reps, Seated Long Arc Quad: AROM, Both, 15 reps, Seated Hip Flexion/Marching: AROM, Both, 10 reps, Seated    General Comments        Pertinent Vitals/Pain Pain Assessment Pain Assessment: No/denies pain    Home Living                           Prior Function            PT Goals (current goals can now be found in the care plan section) Acute Rehab PT Goals Patient Stated Goal: return home PT Goal Formulation: With patient Time For Goal Achievement: 05/16/23 Potential to Achieve Goals: Good Progress towards PT goals: Progressing toward goals    Frequency    Min 3X/week      PT Plan Current plan remains appropriate    Co-evaluation              AM-PAC PT "6 Clicks" Mobility   Outcome Measure  Help needed turning from your back to your side while in a flat bed without using bedrails?: A Lot Help needed moving from lying on your back to sitting on the side of a flat bed without using bedrails?: A Lot Help needed moving to and from a bed to a chair (including a wheelchair)?: A Lot Help needed standing up from a chair using your arms (e.g., wheelchair or bedside chair)?: A Lot Help needed to walk in hospital room?: Total Help needed climbing 3-5 steps with a railing? : Total 6 Click Score: 10    End of Session   Activity Tolerance: Patient tolerated treatment well Patient left: in chair;with call bell/phone within reach;with chair alarm set Nurse Communication: Mobility status PT Visit Diagnosis: Unsteadiness on feet (R26.81);Other abnormalities of gait and mobility (R26.89);Muscle weakness (generalized) (M62.81)     Time: 0981-1914 PT Time Calculation (min) (ACUTE ONLY): 9 min  Charges:  $Therapeutic Exercise: 8-22 mins                     10:04 AM, 05/03/23 Wyman Songster PT, DPT Physical Therapist at [facility]

## 2023-05-03 NOTE — TOC Progression Note (Signed)
Transition of Care [facility]) - Progression Note    Patient Details  Name: [name] MRN: [id number] Date of Birth: 1929/05/31  Transition of Care [facility]) CM/SW Contact  V[place] Her[place], [place] Phone Number: 05/03/2023, 3:44 PM  Clinical Narrative:    CSW spoke with pts daughter who states that she has spoken with MD and will be speaking with her father shortly. She feels that placement may be needed. They would prefer SNF in [place]. CSW sent referral out for review. Auth to be started. TOC to follow.   Expected Discharge Plan: Home w Home Health Services Barriers to Discharge: Continued Medical Work up  Expected Discharge Plan and Services In-house Referral: Clinical Social Work Discharge Planning Services: CM Consult Post Acute Care Choice: Home Health Living arrangements for the past 2 months: Single Family Home                                       Social Determinants of Health (SDOH) Interventions SDOH Screenings   Food Insecurity: No Food Insecurity (02/10/2023)  Housing: Low Risk  (02/10/2023)  Transportation Needs: No Transportation Needs (02/10/2023)  Utilities: Not At Risk (02/10/2023)  Physical Activity: Inactive (02/08/2019)  Social Connections: Unknown (02/08/2019)  Stress: No Stress Concern Present (02/08/2019)  Tobacco Use: Low Risk  (05/01/2023)    Readmission Risk Interventions    02/12/2023   11:26 AM 01/04/2023   11:03 AM  Readmission Risk Prevention Plan  Transportation Screening Complete Complete  HRI or Home Care Consult Complete   Social Work Consult for Recovery Care Planning/Counseling Complete Complete  Palliative Care Screening Not Applicable   Medication Review Oceanographer) Complete

## 2023-05-03 NOTE — NC FL2 (Addendum)
[place] MEDICAID FL2 LEVEL OF CARE FORM     IDENTIFICATION  Patient Name: [name] Birthdate: [birth year] Sex: female Admission Date (Current Location): 05/01/2023  [facility] and [place] Number:  [employer] and Address:  [facility],  [address] [place] [address]      Provider Number: [id number]  Attending Physician Name and Address:  Hollice Espy, MD  Relative Name and Phone Number:       Current Level of Care: Hospital Recommended Level of Care: Skilled Nursing Facility Prior Approval Number:    Date Approved/Denied:   PASRR Number:  [id number]  Discharge Plan: SNF    Current Diagnoses: Patient Active Problem List   Diagnosis Date Noted   Hypoglycemia 02/10/2023   Chest pain 02/10/2023   Depression 02/10/2023   CAD (coronary artery disease) 01/04/2023   Pressure injury of skin 01/04/2023   Physical debility 01/04/2023   Leukocytosis 01/04/2023   Acute ST elevation myocardial infarction (STEMI) of inferior wall (HCC) 12/31/2022   SARS-CoV-2 positive 08/24/2022   SARS-CoV-2 antibody positive 08/23/2022   Emphysema of lung (HCC) 08/21/2022   Vascular dementia without behavioral disturbance (HCC) 08/21/2022   Decubitus ulcer of buttock, stage 2 (HCC) 08/13/2022   Hypomagnesemia 08/03/2022   Hypoglycemia associated with diabetes (HCC) 08/02/2022   Hypokalemia 08/02/2022   Poorly controlled type 2 diabetes mellitus with neuropathy (HCC) 07/19/2022   Diabetic neuropathy associated with type 2 diabetes mellitus (HCC) 07/16/2022   Hypertension associated with type 2 diabetes mellitus (HCC) 07/16/2022   Hyperlipidemia associated with type 2 diabetes mellitus (HCC) 07/16/2022   Chronic insomnia 07/16/2022   Bilateral lower extremity edema 07/16/2022   Chronic constipation 07/16/2022   Aortic atherosclerosis (HCC) 07/16/2022   Major neurocognitive disorder (HCC) 07/16/2022   DJD (degenerative joint disease) of knee  07/14/2022   UTI (urinary tract infection) 07/09/2022   Late effects of cerebrovascular disease 09/25/2021   Acute metabolic encephalopathy 07/14/2019   Cellulitis of buttock 07/14/2019   Hyperlipidemia 02/20/2016   Essential hypertension 10/10/2015   Controlled type 2 diabetes with neuropathy (HCC) 10/01/2008    Orientation RESPIRATION BLADDER Height & Weight     Self  Normal Incontinent Weight: 154 lb 1.6 oz (69.9 kg) Height:  5\' 8"  (172.7 cm)  BEHAVIORAL SYMPTOMS/MOOD NEUROLOGICAL BOWEL NUTRITION STATUS      Incontinent    AMBULATORY STATUS COMMUNICATION OF NEEDS Skin   Extensive Assist Verbally Normal                       Personal Care Assistance Level of Assistance  Bathing, Feeding, Dressing Bathing Assistance: Limited assistance Feeding assistance: Independent Dressing Assistance: Limited assistance     Functional Limitations Info  Sight, Hearing, Speech Sight Info: Adequate Hearing Info: Adequate Speech Info: Adequate    SPECIAL CARE FACTORS FREQUENCY  PT (By licensed PT), OT (By licensed OT)     PT Frequency: 5 times weekly OT Frequency: 5 times weekly            Contractures Contractures Info: Not present    Additional Factors Info  Code Status, Allergies Code Status Info: DNR Allergies Info: Codeine, Glipizide, Sulfa Antibiotics           Current Medications (05/03/2023):  This is the current hospital active medication list Current Facility-Administered Medications  Medication Dose Route Frequency Provider Last Rate Last Admin   0.9 %  sodium chloride infusion   Intravenous PRN Hollice Espy, MD  Stopped at 05/03/23 0208   acetaminophen (TYLENOL) tablet 650 mg  650 mg Oral Q6H PRN Elgergawy, Leana Roe, MD       Or   acetaminophen (TYLENOL) suppository 650 mg  650 mg Rectal Q6H PRN Elgergawy, Leana Roe, MD       albuterol (PROVENTIL) (2.5 MG/3ML) 0.083% nebulizer solution 2.5 mg  2.5 mg Nebulization Q2H PRN Elgergawy, Leana Roe, MD        aspirin EC tablet 81 mg  81 mg Oral Daily Elgergawy, Leana Roe, MD   81 mg at 05/03/23 1610   ciprofloxacin (CIPRO) tablet 250 mg  250 mg Oral BID Hollice Espy, MD   250 mg at 05/03/23 1151   furosemide (LASIX) injection 20 mg  20 mg Intravenous BID Hollice Espy, MD   20 mg at 05/03/23 0836   heparin injection 5,000 Units  5,000 Units Subcutaneous Q8H Elgergawy, Leana Roe, MD   5,000 Units at 05/03/23 1335   hydrALAZINE (APRESOLINE) injection 5 mg  5 mg Intravenous Q4H PRN Elgergawy, Leana Roe, MD   5 mg at 05/01/23 2157   insulin aspart (novoLOG) injection 0-5 Units  0-5 Units Subcutaneous QHS Hollice Espy, MD   3 Units at 05/02/23 2140   insulin aspart (novoLOG) injection 0-9 Units  0-9 Units Subcutaneous TID WC Hollice Espy, MD   7 Units at 05/03/23 1202   metoprolol succinate (TOPROL-XL) 24 hr tablet 50 mg  50 mg Oral Daily Elgergawy, Leana Roe, MD   50 mg at 05/03/23 0919   nitroGLYCERIN (NITROSTAT) SL tablet 0.4 mg  0.4 mg Sublingual Q5 min PRN Elgergawy, Leana Roe, MD       ondansetron (ZOFRAN) injection 4 mg  4 mg Intravenous Q6H PRN Hollice Espy, MD   4 mg at 05/02/23 0929   rosuvastatin (CRESTOR) tablet 20 mg  20 mg Oral q1800 Elgergawy, Leana Roe, MD   20 mg at 05/02/23 1731   sertraline (ZOLOFT) tablet 75 mg  75 mg Oral Daily Elgergawy, Leana Roe, MD   75 mg at 05/03/23 9604   tamsulosin (FLOMAX) capsule 0.4 mg  0.4 mg Oral QPC supper Elgergawy, Leana Roe, MD   0.4 mg at 05/02/23 1612   ticagrelor (BRILINTA) tablet 90 mg  90 mg Oral BID Elgergawy, Leana Roe, MD   90 mg at 05/03/23 0919   zolpidem (AMBIEN) tablet 5 mg  5 mg Oral QHS PRN Elgergawy, Leana Roe, MD   5 mg at 05/02/23 2147     Discharge Medications: Please see discharge summary for a list of discharge medications.  Relevant Imaging Results:  Relevant Lab Results:   Additional Information SSN: [id number]  [place], [place]

## 2023-05-04 DIAGNOSIS — F03918 Unspecified dementia, unspecified severity, with other behavioral disturbance: Secondary | ICD-10-CM

## 2023-05-04 DIAGNOSIS — N3000 Acute cystitis without hematuria: Secondary | ICD-10-CM | POA: Diagnosis not present

## 2023-05-04 DIAGNOSIS — K5900 Constipation, unspecified: Secondary | ICD-10-CM

## 2023-05-04 DIAGNOSIS — G9341 Metabolic encephalopathy: Secondary | ICD-10-CM | POA: Diagnosis not present

## 2023-05-04 LAB — GLUCOSE, CAPILLARY
Glucose-Capillary: 195 mg/dL — ABNORMAL HIGH (ref 70–99)
Glucose-Capillary: 228 mg/dL — ABNORMAL HIGH (ref 70–99)
Glucose-Capillary: 209 mg/dL — ABNORMAL HIGH (ref 70–99)
Glucose-Capillary: 164 mg/dL — ABNORMAL HIGH (ref 70–99)

## 2023-05-04 LAB — BASIC METABOLIC PANEL
Anion gap: 11 (ref 5–15)
BUN: 19 mg/dL (ref 8–23)
CO2: 26 mmol/L (ref 22–32)
Calcium: 9.1 mg/dL (ref 8.9–10.3)
Chloride: 97 mmol/L — ABNORMAL LOW (ref 98–111)
Creatinine, Ser: 0.81 mg/dL (ref 0.44–1.00)
GFR, Estimated: >60 mL/min (ref >60)
Glucose, Bld: 156 mg/dL — ABNORMAL HIGH (ref 70–99)
Potassium: 3.2 mmol/L — ABNORMAL LOW (ref 3.5–5.1)
Sodium: 134 mmol/L — ABNORMAL LOW (ref 135–145)

## 2023-05-04 LAB — MAGNESIUM: Magnesium: 1.8 mg/dL (ref 1.7–2.4)

## 2023-05-04 LAB — BRAIN NATRIURETIC PEPTIDE: B Natriuretic Peptide: 163 pg/mL — ABNORMAL HIGH (ref 0.0–100.0)

## 2023-05-04 MED ORDER — POTASSIUM CHLORIDE CRYS ER 20 MEQ PO TBCR
40.0000 meq | EXTENDED_RELEASE_TABLET | Freq: Once | ORAL | Status: AC
  Administered 2023-05-04: 40 meq via ORAL
  Filled 2023-05-04: qty 2

## 2023-05-04 NOTE — TOC Progression Note (Signed)
Transition of Care [facility]) - Progression Note    Patient Details  Name: [name] MRN: [id number] Date of Birth: 1929-04-06  [facility] CM/SW Contact  Elliot Gault, LCSW Phone Number: 05/04/2023, 1:50 PM  Clinical Narrative:     TOC following. Spoke with pt's daughter today to follow up on dc planning. Per dtr, she has spoken with her dad, pt's husband, and he states that he wants to take her home at dc. He does not want to place her in SNF.  Dtr states that they have increased their private duty caregiver hours and dtr aware TOC can refer for [facility] if family wishes.  Updated MD. Will continue to follow.  Expected Discharge Plan: Home w Home Health Services Barriers to Discharge: Continued Medical Work up  Expected Discharge Plan and Services In-house Referral: Clinical Social Work Discharge Planning Services: CM Consult Post Acute Care Choice: Home Health Living arrangements for the past 2 months: Single Family Home                                       Social Determinants of Health (SDOH) Interventions SDOH Screenings   Food Insecurity: No Food Insecurity (02/10/2023)  Housing: Low Risk  (02/10/2023)  Transportation Needs: No Transportation Needs (02/10/2023)  Utilities: Not At Risk (02/10/2023)  Physical Activity: Inactive (02/08/2019)  Social Connections: Unknown (02/08/2019)  Stress: No Stress Concern Present (02/08/2019)  Tobacco Use: Low Risk  (05/01/2023)    Readmission Risk Interventions    02/12/2023   11:26 AM 01/04/2023   11:03 AM  Readmission Risk Prevention Plan  Transportation Screening Complete Complete  HRI or Home Care Consult Complete   Social Work Consult for Recovery Care Planning/Counseling Complete Complete  Palliative Care Screening Not Applicable   Medication Review Oceanographer) Complete

## 2023-05-04 NOTE — Progress Notes (Signed)
[facility] Progress Note  Patient: [name]    ZOX:[id number]  DOA: 05/01/2023    Date of Service: the patient was seen and examined on 05/04/2023  Brief hospital course: Patient is a 89 year old female with past medical history of hypertension, CVA, urinary incontinence, mild senile dementia and diabetes mellitus brought into the emergency room on 5/8 with increased confusion, generalized weakness and failure to thrive over the past few days.  Patient's daughter related she had had similar presentations when she had had urinary tract infections in the past.  The emergency room, workup revealed significant fecal impaction and large urinary tract infection.  Patient admitted to the hospitalist service for further evaluation.   Assessment and Plan: Senile dementia with acute behavioral disturbance/acute metabolic encephalopathy secondary to urinary tract infection: Initially on IV Rocephin.  Gentle IV fluids.  Urine culture grew out Enterobacter, pansensitive.  Given stability, changed over to p.o. Cipro.  CT scan of head on admission unremarkable.   Diabetes mellitus, type II Recheck A1c, last 1 at 6.8 in February.  Now with elevated blood sugars ranging from the 180s to 270s.  Cover with sliding scale.  A1C now at 8.4.  Discussed with daughter and patient was taken off of her insulin few months ago for concerns for hypoglycemia.  Given her advanced age, would not make any changes.   Hypokalemia Still slightly low, today at 3.2.  Initially was low due to bowel regimen given.  Now secondary to diuresis.  Essential hypertension -Continue with metoprolol   Depression -Continue zoloft   Constipation/fecal impaction Patient responded well with smog enema, scheduled suppositories and laxatives    CAD (coronary artery disease) -Continue asa, beta blocker, statin, Brilinta   Acute on chronic diastolic heart failure-resolved Echocardiogram in January 2024 notes grade 1 diastolic  dysfunction.  BNP ordered and came back elevated at 553.  Started patient on IV Lasix on 5/9.  BNP today at 163.  Will stop further Lasix.  Hyperlipidemia -Continue statin   Overactive bladder-resolved as of 02/10/2023 -Continue rapaflo      Body mass index is 23.43 kg/m.     Consultants: None  Procedures: None  Antimicrobials: IV Rocephin 5/8-5/10 P.o. Cipro 5/10-5/12  Code Status: DNR   Subjective: Resting comfortably, no complaints  Objective: Vitals:   05/04/23 0602 05/04/23 0700  BP: (!) 145/67 139/70  Pulse: 69 70  Resp: 20 20  Temp: 97.7 F (36.5 C) 98.6 F (37 C)  SpO2: 98% 98%    Intake/Output Summary (Last 24 hours) at 05/04/2023 0848 Last data filed at 05/04/2023 0448 Gross per 24 hour  Intake 392.96 ml  Output 600 ml  Net -207.04 ml    Filed Weights   05/01/23 2019  Weight: 69.9 kg   Body mass index is 23.43 kg/m.  Exam:  General: Somnolent, oriented x 1 HEENT: Normocephalic and atraumatic, mucous membranes moist Cardiovascular: Regular rate and rhythm, S1-S2 Respiratory: Clear to auscultation bilaterally Abdomen: Soft, nontender, nondistended, positive bowel sounds Musculoskeletal: No clubbing or cyanosis or edema Skin: No skin breaks, tears or lesions Psychiatry: Underlying dementia, flattened affect Neurology: No focal deficits  Data Reviewed: Potassium of 3.2.  BNP at 163  Disposition:  Status is: Inpatient     Anticipated discharge date: 5/13  Remaining issues to be resolved so that patient can be discharged:  -Skilled nursing facility placement   Family Communication: Spoke with daughter by phone DVT Prophylaxis: heparin injection 5,000 Units Start: 05/01/23 2200    Author: Francene Castle  Mordecai Rasmussen ,MD 05/04/2023 8:48 AM  To reach On-call, see care teams to locate the attending and reach out via [email or web address]. Between 7PM-7AM, please contact night-coverage If you still have difficulty reaching the attending  provider, please page the [facility] (Director on Call) for [facility] on amion for assistance.

## 2023-05-05 DIAGNOSIS — E1140 Type 2 diabetes mellitus with diabetic neuropathy, unspecified: Secondary | ICD-10-CM | POA: Diagnosis not present

## 2023-05-05 DIAGNOSIS — I700 Atherosclerosis of aorta: Secondary | ICD-10-CM

## 2023-05-05 DIAGNOSIS — E1142 Type 2 diabetes mellitus with diabetic polyneuropathy: Secondary | ICD-10-CM

## 2023-05-05 DIAGNOSIS — E860 Dehydration: Secondary | ICD-10-CM

## 2023-05-05 DIAGNOSIS — G9341 Metabolic encephalopathy: Secondary | ICD-10-CM | POA: Diagnosis not present

## 2023-05-05 DIAGNOSIS — E782 Mixed hyperlipidemia: Secondary | ICD-10-CM

## 2023-05-05 LAB — GLUCOSE, CAPILLARY
Glucose-Capillary: 213 mg/dL — ABNORMAL HIGH (ref 70–99)
Glucose-Capillary: 181 mg/dL — ABNORMAL HIGH (ref 70–99)

## 2023-05-05 MED ORDER — CIPROFLOXACIN HCL 250 MG PO TABS: 250.0000 mg | ORAL_TABLET | Freq: Two times a day (BID) | ORAL | 0 refills | Status: AC

## 2023-05-05 MED ORDER — GABAPENTIN 300 MG PO CAPS
300.0000 mg | ORAL_CAPSULE | Freq: Two times a day (BID) | ORAL | Status: DC
Start: 2023-05-05 — End: 2024-10-24

## 2023-05-05 NOTE — Discharge Summary (Signed)
Physician Discharge Summary   Patient: [name] MRN: [id number] DOB: 29-Sep-1929  Admit date:     05/01/2023  Discharge date: 05/05/23  Discharge Physician: Vassie Loll   PCP: Assunta Found, MD   Recommendations at discharge:  Repeat basic metabolic panel to follow electrolytes and renal function Continue close monitoring of patient's CBGs/A1c with further decision on hypoglycemic regimen as needed. Close monitoring to patient blood pressure with further adjustment to antihypertensive regimen as required.  Discharge Diagnoses: Principal Problem:   UTI (urinary tract infection) Active Problems:   Acute metabolic encephalopathy   Controlled type 2 diabetes with neuropathy (HCC)   Hyperlipidemia   Diabetic neuropathy associated with type 2 diabetes mellitus (HCC)   Chronic constipation   Aortic atherosclerosis (HCC)   Emphysema of lung (HCC)   CAD (coronary artery disease)   Dehydration  Brief Hospital admission narrative: Patient is a 89 year old female with past medical history of hypertension, CVA, urinary incontinence, mild senile dementia and diabetes mellitus brought into the emergency room on 5/8 with increased confusion, generalized weakness and failure to thrive over the past few days. Patient's daughter related she had had similar presentations when she had had urinary tract infections in the past. The emergency room, workup revealed significant fecal impaction and large urinary tract infection. Patient admitted to the hospitalist service for further evaluation.   Assessment and Plan: Senile dementia with acute behavioral disturbance/acute metabolic encephalopathy secondary to Enterobacter urinary tract infection: Initially on IV Rocephin.   -Adequately improved and mentation back to baseline. -Following sensitivity antibiotics transition to oral Cipro to complete therapy -Patient advised to maintain adequate hydration.   -Afebrile, normal WBCs and no dysuria or  discharge.   Diabetes mellitus, type II Recheck A1c 8.4 -Given patient's decrease in oral intake and high risk for hypoglycemia lately hypoglycemic agents were recently discontinue -Will recommend close follow-up by primary care physician will continue CBGs monitoring to further decide hypoglycemic therapy. -Modified carbohydrate diet has been advised.   Hypokalemia -Electrolytes repleted and essentially within normal limits at discharge. -Repeat basic metabolic panel at follow-up visit to assess stability.   Essential hypertension -Continue treatment with metoprolol -Heart healthy/low-sodium diet discussed with patient.   Depression -Continue zoloft -No suicidal ideation or hallucinations.   Constipation/fecal impaction -Patient responded well with smog enema, scheduled suppositories and laxatives -Continue to maintain adequate hydration and increase fiber intake.    CAD (coronary artery disease) -Continue asa, beta blocker, statin, Brilinta -Continue outpatient follow-up with cardiology service.   Acute on chronic diastolic heart failure-resolved -Echocardiogram in January 2024 notes grade 1 diastolic dysfunction.   -BNP ordered and came back elevated at 553.  Started patient on IV Lasix on 5/9.   -BNP at 163 prior to discharge.   -Continue to follow low-sodium diet, daily weights and adequate hydration -No diuretics provided at discharge.     Hyperlipidemia -Continue statin -Heart healthy diet discussed with patient.   Overactive bladder-resolved as of 02/10/2023 -Continue rapaflo  Physical deconditioning -Seen by physical therapy with initial recommendation to skilled nursing facility for rehab; after further discussion with family members decision made to take patient home and increase hours of around-the-clock caregivers -Home health PT, home health RN and home health social worker has been arranged by TOC prior to discharge.  Consultants: None Procedures  performed: See below for x-ray reports. Disposition: Home with home health services. Diet recommendation: Heart healthy diet/modified carbohydrates.  DISCHARGE MEDICATION: Allergies as of 05/05/2023       Reactions  Codeine Nausea And Vomiting   Glipizide    Hypoglycemia with glucoses recorded in the 20s necessitating glucagon and D10 IV   Sulfa Antibiotics Other (See Comments)   Unspecified         Medication List     STOP taking these medications    losartan 25 MG tablet Commonly known as: COZAAR   metFORMIN 500 MG tablet Commonly known as: GLUCOPHAGE   metoprolol tartrate 25 MG tablet Commonly known as: LOPRESSOR   simvastatin 20 MG tablet Commonly known as: ZOCOR   solifenacin 10 MG tablet Commonly known as: VESICARE       TAKE these medications    acetaminophen 500 MG tablet Commonly known as: TYLENOL Take 500 mg by mouth every 6 (six) hours as needed for mild pain.   aspirin EC 81 MG tablet Take 1 tablet (81 mg total) by mouth daily. Swallow whole.   BD AutoShield Duo 30G X 5 MM Misc Generic drug: Insulin Pen Needle by Does not apply route as directed. 30 gauge X 3/16   blood glucose meter kit and supplies Kit Dispense based on patient and insurance preference. Use to TEST BLOOD GLUCOSE THREE times daily as directed. DX E11.65   ciprofloxacin 250 MG tablet Commonly known as: CIPRO Take 1 tablet (250 mg total) by mouth 2 (two) times daily for 3 days.   diclofenac Sodium 1 % Gel Commonly known as: Voltaren Arthritis Pain Apply 2 g topically 3 (three) times daily. bilateral knees for osteoarthritis   gabapentin 300 MG capsule Commonly known as: NEURONTIN Take 1 capsule (300 mg total) by mouth 2 (two) times daily. Take 300 mg three times daily What changed:  how much to take how to take this when to take this   metoprolol succinate 50 MG 24 hr tablet Commonly known as: TOPROL-XL Take 50 mg by mouth at bedtime.   nitroGLYCERIN 0.4 MG SL  tablet Commonly known as: NITROSTAT Place 1 tablet (0.4 mg total) under the tongue every 5 (five) minutes as needed for chest pain (up to 3 doses. If taking 3rd dose, call 911).   rosuvastatin 20 MG tablet Commonly known as: CRESTOR Take 1 tablet (20 mg total) by mouth daily.   silodosin 8 MG Caps capsule Commonly known as: RAPAFLO Take 1 capsule (8 mg total) by mouth daily with breakfast.   ticagrelor 90 MG Tabs tablet Commonly known as: BRILINTA Take 1 tablet (90 mg total) by mouth 2 (two) times daily.   Trospium Chloride 60 MG Cp24 Take 1 capsule by mouth daily.   Vitamin D3 125 MCG (5000 UT) Caps Take 1 capsule (5,000 Units total) by mouth daily.   Zoloft 50 MG tablet Generic drug: sertraline Take 50 mg by mouth daily. Take with 25mg  dose to equal 75mg  daily.   sertraline 25 MG tablet Commonly known as: ZOLOFT Take 25 mg by mouth daily. Take with 50mg  dose to equal 75mg  daily.   zolpidem 10 MG tablet Commonly known as: AMBIEN Take 10 mg by mouth at bedtime as needed for sleep.        Follow-up Information     Assunta Found, MD. Schedule an appointment as soon as possible for a visit in 10 day(s).   Specialty: Family Medicine Contact information: [address] [place] [address] [phone]                Discharge Exam: Ceasar Mons Weights   05/01/23 2019  Weight: 69.9 kg   General exam: Alert, awake,  oriented x 1; in no acute distress and following commands appropriately.  No nausea or vomiting. Respiratory system: Clear to auscultation. Respiratory effort normal.  Good saturation on room air. Cardiovascular system: No rubs or gallops; no JVD. Gastrointestinal system: Abdomen is nondistended, soft and nontender. No organomegaly or masses felt. Normal bowel sounds heard. Central nervous system: Alert and oriented. No focal neurological deficits. Extremities: No cyanosis or clubbing. Skin: No petechiae. Psychiatry: Judgement and insight appear  normal. Mood & affect appropriate.    Condition at discharge: Stable and improved.  The results of significant diagnostics from this hospitalization (including imaging, microbiology, ancillary and laboratory) are listed below for reference.   Imaging Studies: CT ABDOMEN PELVIS W CONTRAST  Result Date: 05/01/2023 CLINICAL DATA:  Left lower quadrant abdominal pain. EXAM: CT ABDOMEN AND PELVIS WITH CONTRAST TECHNIQUE: Multidetector CT imaging of the abdomen and pelvis was performed using the standard protocol following bolus administration of intravenous contrast. RADIATION DOSE REDUCTION: This exam was performed according to the departmental dose-optimization program which includes automated exposure control, adjustment of the mA and/or kV according to patient size and/or use of iterative reconstruction technique. CONTRAST:  80mL OMNIPAQUE IOHEXOL 300 MG/ML  SOLN COMPARISON:  August 13, 2022. FINDINGS: Lower chest: No acute abnormality. Hepatobiliary: No focal liver abnormality is seen. No gallstones, gallbladder wall thickening, or biliary dilatation. Pancreas: Unremarkable. No pancreatic ductal dilatation or surrounding inflammatory changes. Spleen: Normal in size without focal abnormality. Adrenals/Urinary Tract: Stable probable left adrenal adenoma. Right adrenal gland is unremarkable. No hydronephrosis or renal obstruction is noted. No renal or ureteral calculi are noted. Urinary bladder is unremarkable. Stomach/Bowel: Stomach is unremarkable. Stool is noted throughout the colon. The appendix is not visualized. There is no evidence of bowel obstruction or inflammation. Moderate amount of stool is noted in the rectum concerning for impaction. Vascular/Lymphatic: Aortic atherosclerosis. No enlarged abdominal or pelvic lymph nodes. Reproductive: Status post hysterectomy. 4.5 x 4.0 cm triangular-shaped fluid collection is seen in left adnexal region which is not significantly changed compared to prior exam.  Other: No abdominal wall hernia or abnormality. No abdominopelvic ascites. Musculoskeletal: Status post surgical fusion of L4-5. No acute osseous abnormality is noted. Mildly increased inflammatory stranding is seen lateral to the proximal left femur which may represent cellulitis or potentially trochanteric bursitis. IMPRESSION: Moderate amount of stool seen in rectum concerning for impaction. Grossly stable 4.5 x 5.4 cm triangular-shaped fluid collection seen in left adnexal region concerning for adnexal cystic lesion. This is not considered to be likely clinically significant. Stable probable left adrenal adenoma. Mildly increased inflammatory stranding is seen in the soft tissues lateral to the proximal left femur which may represent cellulitis or potentially trochanteric bursitis. Aortic Atherosclerosis (ICD10-I70.0). Electronically Signed   By: Lupita Raider M.D.   On: 05/01/2023 15:30   CT Head Wo Contrast  Result Date: 05/01/2023 CLINICAL DATA:  Mental status change, unknown cause. EXAM: CT HEAD WITHOUT CONTRAST TECHNIQUE: Contiguous axial images were obtained from the base of the skull through the vertex without intravenous contrast. RADIATION DOSE REDUCTION: This exam was performed according to the departmental dose-optimization program which includes automated exposure control, adjustment of the mA and/or kV according to patient size and/or use of iterative reconstruction technique. COMPARISON:  Head CT 08/13/2022. FINDINGS: Brain: No acute hemorrhage. Unchanged moderate chronic small-vessel disease. Cortical gray-white differentiation is otherwise preserved. Prominence of the ventricles and sulci within expected range for age. No hydrocephalus or extra-axial collection. No mass effect or midline shift. Vascular: No  hyperdense vessel or unexpected calcification. Skull: No calvarial fracture or suspicious bone lesion. Skull base is unremarkable. Sinuses/Orbits: Unremarkable. Other: None. IMPRESSION:  1. No acute intracranial abnormality. 2. Unchanged moderate chronic small-vessel disease. Electronically Signed   By: Orvan Falconer M.D.   On: 05/01/2023 15:18   DG Chest Portable 1 View  Result Date: 05/01/2023 CLINICAL DATA:  Two day history of weakness, decreased appetite, and lethargy EXAM: PORTABLE CHEST 1 VIEW COMPARISON:  Chest radiograph dated 02/10/2023 FINDINGS: Normal lung volumes. No focal consolidations. No pleural effusion or pneumothorax. The heart size and mediastinal contours are within normal limits. No acute osseous abnormality. Right humeral bone anchors. IMPRESSION: No active disease. Electronically Signed   By: Agustin Cree M.D.   On: 05/01/2023 12:03    Microbiology: Results for orders placed or performed during the hospital encounter of 05/01/23  Urine Culture     Status: Abnormal   Collection Time: 05/01/23  1:42 PM   Specimen: Urine, Random  Result Value Ref Range Status   Specimen Description   Final    URINE, RANDOM Performed at [facility], [address], [place], [place] [address]    Special Requests   Final    NONE Reflexed from [id number] Performed at [facility], [address] [place], [place] [address]    Culture (A)  Final    30,000 COLONIES/mL ENTEROBACTER CLOACAE 20,000 COLONIES/mL AEROCOCCUS SPECIES Standardized susceptibility testing for this organism is not available. Performed at [facility], [address] [place], [place] [address]    Report Status 05/03/2023 FINAL  Final   Organism ID, Bacteria ENTEROBACTER CLOACAE (A)  Final      Susceptibility   Enterobacter cloacae - MIC*    CEFEPIME <=0.12 SENSITIVE Sensitive     CIPROFLOXACIN <=0.25 SENSITIVE Sensitive     GENTAMICIN <=1 SENSITIVE Sensitive     IMIPENEM 1 SENSITIVE Sensitive     NITROFURANTOIN <=16 SENSITIVE Sensitive     TRIMETH/SULFA <=20 SENSITIVE Sensitive     PIP/TAZO <=4 SENSITIVE Sensitive     * 30,000 COLONIES/mL ENTEROBACTER CLOACAE    Labs: CBC: Recent Labs   Lab 05/01/23 1219 05/02/23 0419  WBC 9.1 10.1  NEUTROABS 6.9  --   HGB 14.3 13.2  HCT 43.3 39.5  MCV 85.1 84.0  PLT 244 258   Basic Metabolic Panel: Recent Labs  Lab 05/01/23 1219 05/02/23 0419 05/02/23 1308 05/03/23 0440 05/04/23 0407  NA 137 134*  --  135 134*  K 3.1* 2.6*  --  3.0* 3.2*  CL 99 98  --  100 97*  CO2 22 21*  --  24 26  GLUCOSE 230* 195*  --  175* 156*  BUN 15 13  --  16 19  CREATININE 0.90 0.69  --  0.81 0.81  CALCIUM 9.2 8.7*  --  8.8* 9.1  MG  --   --  1.7 1.9 1.8   Liver Function Tests: Recent Labs  Lab 05/01/23 1219  AST 19  ALT 10  ALKPHOS 91  BILITOT 1.4*  PROT 6.9  ALBUMIN 3.2*   CBG: Recent Labs  Lab 05/04/23 0745 05/04/23 1135 05/04/23 1615 05/04/23 2003 05/05/23 0719  GLUCAP 195* 228* 209* 164* 213*    Discharge time spent: greater than 30 minutes.  Signed: Vassie Loll, MD [facility] 05/05/2023

## 2023-05-05 NOTE — Progress Notes (Signed)
Nsg Discharge Note  Admit Date:  05/01/2023 Discharge date: 05/05/2023   [name] to be D/C'd Home per MD order.  AVS completed.  Patient/caregiver able to verbalize understanding.  Discharge Medication: Allergies as of 05/05/2023       Reactions   Codeine Nausea And Vomiting   Glipizide    Hypoglycemia with glucoses recorded in the 20s necessitating glucagon and D10 IV   Sulfa Antibiotics Other (See Comments)   Unspecified         Medication List     STOP taking these medications    losartan 25 MG tablet Commonly known as: COZAAR   metFORMIN 500 MG tablet Commonly known as: GLUCOPHAGE   metoprolol tartrate 25 MG tablet Commonly known as: LOPRESSOR   simvastatin 20 MG tablet Commonly known as: ZOCOR   solifenacin 10 MG tablet Commonly known as: VESICARE       TAKE these medications    acetaminophen 500 MG tablet Commonly known as: TYLENOL Take 500 mg by mouth every 6 (six) hours as needed for mild pain.   aspirin EC 81 MG tablet Take 1 tablet (81 mg total) by mouth daily. Swallow whole.   BD AutoShield Duo 30G X 5 MM Misc Generic drug: Insulin Pen Needle by Does not apply route as directed. 30 gauge X 3/16   blood glucose meter kit and supplies Kit Dispense based on patient and insurance preference. Use to TEST BLOOD GLUCOSE THREE times daily as directed. DX E11.65   ciprofloxacin 250 MG tablet Commonly known as: CIPRO Take 1 tablet (250 mg total) by mouth 2 (two) times daily for 3 days.   diclofenac Sodium 1 % Gel Commonly known as: Voltaren Arthritis Pain Apply 2 g topically 3 (three) times daily. bilateral knees for osteoarthritis   gabapentin 300 MG capsule Commonly known as: NEURONTIN Take 1 capsule (300 mg total) by mouth 2 (two) times daily. Take 300 mg three times daily What changed:  how much to take how to take this when to take this   metoprolol succinate 50 MG 24 hr tablet Commonly known as: TOPROL-XL Take 50 mg by mouth  at bedtime.   nitroGLYCERIN 0.4 MG SL tablet Commonly known as: NITROSTAT Place 1 tablet (0.4 mg total) under the tongue every 5 (five) minutes as needed for chest pain (up to 3 doses. If taking 3rd dose, call 911).   rosuvastatin 20 MG tablet Commonly known as: CRESTOR Take 1 tablet (20 mg total) by mouth daily.   silodosin 8 MG Caps capsule Commonly known as: RAPAFLO Take 1 capsule (8 mg total) by mouth daily with breakfast.   ticagrelor 90 MG Tabs tablet Commonly known as: BRILINTA Take 1 tablet (90 mg total) by mouth 2 (two) times daily.   Trospium Chloride 60 MG Cp24 Take 1 capsule by mouth daily.   Vitamin D3 125 MCG (5000 UT) Caps Take 1 capsule (5,000 Units total) by mouth daily.   Zoloft 50 MG tablet Generic drug: sertraline Take 50 mg by mouth daily. Take with 25mg  dose to equal 75mg  daily.   sertraline 25 MG tablet Commonly known as: ZOLOFT Take 25 mg by mouth daily. Take with 50mg  dose to equal 75mg  daily.   zolpidem 10 MG tablet Commonly known as: AMBIEN Take 10 mg by mouth at bedtime as needed for sleep.        Discharge Assessment: Vitals:   05/04/23 2004 05/05/23 0441  BP: (!) 144/60 (!) 146/68  Pulse: 69 71  Resp:  20 20  Temp: 98.1 F (36.7 C) 98.5 F (36.9 C)  SpO2: 96% 95%   Skin clean, dry and intact without evidence of skin break down, no evidence of skin tears noted. IV catheter discontinued intact. Site without signs and symptoms of complications - no redness or edema noted at insertion site, patient denies c/o pain - only slight tenderness at site.  Dressing with slight pressure applied.  D/c Instructions-Education: Discharge instructions given to patient/family with verbalized understanding. D/c education completed with patient/family including follow up instructions, medication list, d/c activities limitations if indicated, with other d/c instructions as indicated by MD - patient able to verbalize understanding, all questions fully  answered. Patient instructed to return to ED, call 911, or call MD for any changes in condition.  Patient escorted via WC, and D/C home via private auto.  Laurena Spies, RN 05/05/2023 11:27 AM

## 2023-05-05 NOTE — TOC Transition Note (Signed)
Transition of Care [facility]) - CM/SW Discharge Note   Patient Details  Name: [name] MRN: [id number] Date of Birth: 1929-05-04  Transition of Care [facility]) CM/SW Contact:  Villa Herb, LCSWA Phone Number: 05/05/2023, 11:10 AM   Clinical Narrative:    CSW spoke with pts daughter to confirm the plan for pt to return home at D/C. She states this is the plan and they are agreeable to [facility] being set up. They do not have an agency preference. CSW spoke to Clifton Custard with [facility] who accepts [facility] PT/RN/SW referral. MD placed HH orders. TOC signing off.   Final next level of care: Home w Home Health Services Barriers to Discharge: Barriers Resolved   Patient Goals and CMS Choice CMS Medicare.gov Compare Post Acute Care list provided to:: Patient Represenative (must comment) Choice offered to / list presented to : Spouse, Adult Children  Discharge Placement                         Discharge Plan and Services Additional resources added to the After Visit Summary for   In-house Referral: Clinical Social Work Discharge Planning Services: CM Consult Post Acute Care Choice: Home Health                    HH Arranged: RN, PT, Social Work [facility] Agency: [facility] Date [facility] Agency Contacted: 05/05/23   Representative spoke with at [facility] Agency: Clifton Custard  Social Determinants of Health (SDOH) Interventions SDOH Screenings   Food Insecurity: No Food Insecurity (02/10/2023)  Housing: Low Risk  (02/10/2023)  Transportation Needs: No Transportation Needs (02/10/2023)  Utilities: Not At Risk (02/10/2023)  Physical Activity: Inactive (02/08/2019)  Social Connections: Unknown (02/08/2019)  Stress: No Stress Concern Present (02/08/2019)  Tobacco Use: Low Risk  (05/01/2023)     Readmission Risk Interventions    02/12/2023   11:26 AM 01/04/2023   11:03 AM  Readmission Risk Prevention Plan  Transportation Screening Complete Complete  HRI or Home Care Consult Complete   Social Work  Consult for Recovery Care Planning/Counseling Complete Complete  Palliative Care Screening Not Applicable   Medication Review [facility]) Complete

## 2023-05-06 NOTE — Consult Note (Signed)
[facility] Customer service manager [facility]) Accountable Care Organization (ACO) [facility] Liaison Note  05/06/2023  NICHOLETTE TERRELL 01/14/1929 [id number]  Location: [facility] RN Hospital Liaison screened the patient remotely at [facility].  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 The patient was screened for readmission hospitalization with noted high risk score for unplanned readmission risk with 3 IP in 6 months.  The patient was assessed for potential [facility]) Care Management service needs for post hospital transition for care coordination. Review of patient's electronic medical record reveals patient admitted for UTI and discharged home with [facility] for PT/RN/SW.   Plan: [facility] Hospital Liaison will continue to follow progress and disposition to asess for post hospital community care coordination/management needs.  Referral request for community care coordination: anticipate [facility] Transitions of Care Team follow up.   [facility] Care Management/Population Health does not replace or interfere with any arrangements made by the Inpatient Transition of Care team.   For questions contact:   Elliot Cousin, RN, BSN [facility] Liaison [facility]   [facility]  Population Health Office Hours MTWF 8:00 am to 6 pm off on Thursday [phone] mobile [phone] [Office toll free line][facility] Office Hours are M-F 8:30 - 5 pm 24 hour nurse advise line [phone] Conceirge  [email or web address]

## 2023-05-09 DIAGNOSIS — H9193 Unspecified hearing loss, bilateral: Secondary | ICD-10-CM | POA: Diagnosis not present

## 2023-05-09 DIAGNOSIS — N390 Urinary tract infection, site not specified: Secondary | ICD-10-CM | POA: Diagnosis not present

## 2023-05-09 DIAGNOSIS — E785 Hyperlipidemia, unspecified: Secondary | ICD-10-CM | POA: Diagnosis not present

## 2023-05-09 DIAGNOSIS — Z9049 Acquired absence of other specified parts of digestive tract: Secondary | ICD-10-CM | POA: Diagnosis not present

## 2023-05-09 DIAGNOSIS — J439 Emphysema, unspecified: Secondary | ICD-10-CM | POA: Diagnosis not present

## 2023-05-09 DIAGNOSIS — I5032 Chronic diastolic (congestive) heart failure: Secondary | ICD-10-CM | POA: Diagnosis not present

## 2023-05-09 DIAGNOSIS — R338 Other retention of urine: Secondary | ICD-10-CM | POA: Diagnosis not present

## 2023-05-09 DIAGNOSIS — Z7982 Long term (current) use of aspirin: Secondary | ICD-10-CM | POA: Diagnosis not present

## 2023-05-09 DIAGNOSIS — Z8673 Personal history of transient ischemic attack (TIA), and cerebral infarction without residual deficits: Secondary | ICD-10-CM | POA: Diagnosis not present

## 2023-05-09 DIAGNOSIS — I110 Hypertensive heart disease with heart failure: Secondary | ICD-10-CM | POA: Diagnosis not present

## 2023-05-09 DIAGNOSIS — F0393 Unspecified dementia, unspecified severity, with mood disturbance: Secondary | ICD-10-CM | POA: Diagnosis not present

## 2023-05-09 DIAGNOSIS — I252 Old myocardial infarction: Secondary | ICD-10-CM | POA: Diagnosis not present

## 2023-05-09 DIAGNOSIS — I2510 Atherosclerotic heart disease of native coronary artery without angina pectoris: Secondary | ICD-10-CM | POA: Diagnosis not present

## 2023-05-09 DIAGNOSIS — I10 Essential (primary) hypertension: Secondary | ICD-10-CM | POA: Diagnosis not present

## 2023-05-09 DIAGNOSIS — K5900 Constipation, unspecified: Secondary | ICD-10-CM | POA: Diagnosis not present

## 2023-05-09 DIAGNOSIS — E1140 Type 2 diabetes mellitus with diabetic neuropathy, unspecified: Secondary | ICD-10-CM | POA: Diagnosis not present

## 2023-05-14 DIAGNOSIS — Z9049 Acquired absence of other specified parts of digestive tract: Secondary | ICD-10-CM | POA: Diagnosis not present

## 2023-05-14 DIAGNOSIS — R338 Other retention of urine: Secondary | ICD-10-CM | POA: Diagnosis not present

## 2023-05-14 DIAGNOSIS — J439 Emphysema, unspecified: Secondary | ICD-10-CM | POA: Diagnosis not present

## 2023-05-14 DIAGNOSIS — I5032 Chronic diastolic (congestive) heart failure: Secondary | ICD-10-CM | POA: Diagnosis not present

## 2023-05-14 DIAGNOSIS — N390 Urinary tract infection, site not specified: Secondary | ICD-10-CM | POA: Diagnosis not present

## 2023-05-14 DIAGNOSIS — K5900 Constipation, unspecified: Secondary | ICD-10-CM | POA: Diagnosis not present

## 2023-05-14 DIAGNOSIS — I2510 Atherosclerotic heart disease of native coronary artery without angina pectoris: Secondary | ICD-10-CM | POA: Diagnosis not present

## 2023-05-14 DIAGNOSIS — I110 Hypertensive heart disease with heart failure: Secondary | ICD-10-CM | POA: Diagnosis not present

## 2023-05-14 DIAGNOSIS — F0393 Unspecified dementia, unspecified severity, with mood disturbance: Secondary | ICD-10-CM | POA: Diagnosis not present

## 2023-05-14 DIAGNOSIS — Z7982 Long term (current) use of aspirin: Secondary | ICD-10-CM | POA: Diagnosis not present

## 2023-05-14 DIAGNOSIS — E785 Hyperlipidemia, unspecified: Secondary | ICD-10-CM | POA: Diagnosis not present

## 2023-05-14 DIAGNOSIS — Z8673 Personal history of transient ischemic attack (TIA), and cerebral infarction without residual deficits: Secondary | ICD-10-CM | POA: Diagnosis not present

## 2023-05-14 DIAGNOSIS — I10 Essential (primary) hypertension: Secondary | ICD-10-CM | POA: Diagnosis not present

## 2023-05-14 DIAGNOSIS — I252 Old myocardial infarction: Secondary | ICD-10-CM | POA: Diagnosis not present

## 2023-05-14 DIAGNOSIS — E1140 Type 2 diabetes mellitus with diabetic neuropathy, unspecified: Secondary | ICD-10-CM | POA: Diagnosis not present

## 2023-05-14 DIAGNOSIS — H9193 Unspecified hearing loss, bilateral: Secondary | ICD-10-CM | POA: Diagnosis not present

## 2023-05-21 DIAGNOSIS — I2510 Atherosclerotic heart disease of native coronary artery without angina pectoris: Secondary | ICD-10-CM | POA: Diagnosis not present

## 2023-05-21 DIAGNOSIS — I252 Old myocardial infarction: Secondary | ICD-10-CM | POA: Diagnosis not present

## 2023-05-21 DIAGNOSIS — I10 Essential (primary) hypertension: Secondary | ICD-10-CM | POA: Diagnosis not present

## 2023-05-21 DIAGNOSIS — I110 Hypertensive heart disease with heart failure: Secondary | ICD-10-CM | POA: Diagnosis not present

## 2023-05-21 DIAGNOSIS — N390 Urinary tract infection, site not specified: Secondary | ICD-10-CM | POA: Diagnosis not present

## 2023-05-21 DIAGNOSIS — R338 Other retention of urine: Secondary | ICD-10-CM | POA: Diagnosis not present

## 2023-05-21 DIAGNOSIS — E785 Hyperlipidemia, unspecified: Secondary | ICD-10-CM | POA: Diagnosis not present

## 2023-05-21 DIAGNOSIS — I5032 Chronic diastolic (congestive) heart failure: Secondary | ICD-10-CM | POA: Diagnosis not present

## 2023-05-21 DIAGNOSIS — Z8673 Personal history of transient ischemic attack (TIA), and cerebral infarction without residual deficits: Secondary | ICD-10-CM | POA: Diagnosis not present

## 2023-05-21 DIAGNOSIS — Z7982 Long term (current) use of aspirin: Secondary | ICD-10-CM | POA: Diagnosis not present

## 2023-05-21 DIAGNOSIS — H9193 Unspecified hearing loss, bilateral: Secondary | ICD-10-CM | POA: Diagnosis not present

## 2023-05-21 DIAGNOSIS — K5900 Constipation, unspecified: Secondary | ICD-10-CM | POA: Diagnosis not present

## 2023-05-21 DIAGNOSIS — E1140 Type 2 diabetes mellitus with diabetic neuropathy, unspecified: Secondary | ICD-10-CM | POA: Diagnosis not present

## 2023-05-21 DIAGNOSIS — Z9049 Acquired absence of other specified parts of digestive tract: Secondary | ICD-10-CM | POA: Diagnosis not present

## 2023-05-21 DIAGNOSIS — F0393 Unspecified dementia, unspecified severity, with mood disturbance: Secondary | ICD-10-CM | POA: Diagnosis not present

## 2023-05-21 DIAGNOSIS — J439 Emphysema, unspecified: Secondary | ICD-10-CM | POA: Diagnosis not present

## 2023-05-22 DIAGNOSIS — J439 Emphysema, unspecified: Secondary | ICD-10-CM | POA: Diagnosis not present

## 2023-05-22 DIAGNOSIS — I110 Hypertensive heart disease with heart failure: Secondary | ICD-10-CM | POA: Diagnosis not present

## 2023-05-22 DIAGNOSIS — Z8673 Personal history of transient ischemic attack (TIA), and cerebral infarction without residual deficits: Secondary | ICD-10-CM | POA: Diagnosis not present

## 2023-05-22 DIAGNOSIS — E785 Hyperlipidemia, unspecified: Secondary | ICD-10-CM | POA: Diagnosis not present

## 2023-05-22 DIAGNOSIS — I10 Essential (primary) hypertension: Secondary | ICD-10-CM | POA: Diagnosis not present

## 2023-05-22 DIAGNOSIS — I2510 Atherosclerotic heart disease of native coronary artery without angina pectoris: Secondary | ICD-10-CM | POA: Diagnosis not present

## 2023-05-22 DIAGNOSIS — F0393 Unspecified dementia, unspecified severity, with mood disturbance: Secondary | ICD-10-CM | POA: Diagnosis not present

## 2023-05-22 DIAGNOSIS — K5900 Constipation, unspecified: Secondary | ICD-10-CM | POA: Diagnosis not present

## 2023-05-22 DIAGNOSIS — I5032 Chronic diastolic (congestive) heart failure: Secondary | ICD-10-CM | POA: Diagnosis not present

## 2023-05-22 DIAGNOSIS — R338 Other retention of urine: Secondary | ICD-10-CM | POA: Diagnosis not present

## 2023-05-22 DIAGNOSIS — Z7982 Long term (current) use of aspirin: Secondary | ICD-10-CM | POA: Diagnosis not present

## 2023-05-22 DIAGNOSIS — I252 Old myocardial infarction: Secondary | ICD-10-CM | POA: Diagnosis not present

## 2023-05-22 DIAGNOSIS — N390 Urinary tract infection, site not specified: Secondary | ICD-10-CM | POA: Diagnosis not present

## 2023-05-22 DIAGNOSIS — H9193 Unspecified hearing loss, bilateral: Secondary | ICD-10-CM | POA: Diagnosis not present

## 2023-05-22 DIAGNOSIS — E1140 Type 2 diabetes mellitus with diabetic neuropathy, unspecified: Secondary | ICD-10-CM | POA: Diagnosis not present

## 2023-05-22 DIAGNOSIS — Z9049 Acquired absence of other specified parts of digestive tract: Secondary | ICD-10-CM | POA: Diagnosis not present

## 2023-05-23 DIAGNOSIS — R338 Other retention of urine: Secondary | ICD-10-CM | POA: Diagnosis not present

## 2023-05-23 DIAGNOSIS — I5032 Chronic diastolic (congestive) heart failure: Secondary | ICD-10-CM | POA: Diagnosis not present

## 2023-05-23 DIAGNOSIS — I110 Hypertensive heart disease with heart failure: Secondary | ICD-10-CM | POA: Diagnosis not present

## 2023-05-23 DIAGNOSIS — I10 Essential (primary) hypertension: Secondary | ICD-10-CM | POA: Diagnosis not present

## 2023-05-23 DIAGNOSIS — N390 Urinary tract infection, site not specified: Secondary | ICD-10-CM | POA: Diagnosis not present

## 2023-05-23 DIAGNOSIS — E1140 Type 2 diabetes mellitus with diabetic neuropathy, unspecified: Secondary | ICD-10-CM | POA: Diagnosis not present

## 2023-05-23 DIAGNOSIS — Z8673 Personal history of transient ischemic attack (TIA), and cerebral infarction without residual deficits: Secondary | ICD-10-CM | POA: Diagnosis not present

## 2023-05-23 DIAGNOSIS — Z9049 Acquired absence of other specified parts of digestive tract: Secondary | ICD-10-CM | POA: Diagnosis not present

## 2023-05-23 DIAGNOSIS — E785 Hyperlipidemia, unspecified: Secondary | ICD-10-CM | POA: Diagnosis not present

## 2023-05-23 DIAGNOSIS — I252 Old myocardial infarction: Secondary | ICD-10-CM | POA: Diagnosis not present

## 2023-05-23 DIAGNOSIS — K5900 Constipation, unspecified: Secondary | ICD-10-CM | POA: Diagnosis not present

## 2023-05-23 DIAGNOSIS — I2510 Atherosclerotic heart disease of native coronary artery without angina pectoris: Secondary | ICD-10-CM | POA: Diagnosis not present

## 2023-05-23 DIAGNOSIS — Z7982 Long term (current) use of aspirin: Secondary | ICD-10-CM | POA: Diagnosis not present

## 2023-05-23 DIAGNOSIS — F0393 Unspecified dementia, unspecified severity, with mood disturbance: Secondary | ICD-10-CM | POA: Diagnosis not present

## 2023-05-23 DIAGNOSIS — H9193 Unspecified hearing loss, bilateral: Secondary | ICD-10-CM | POA: Diagnosis not present

## 2023-05-23 DIAGNOSIS — J439 Emphysema, unspecified: Secondary | ICD-10-CM | POA: Diagnosis not present

## 2023-05-28 DIAGNOSIS — E1140 Type 2 diabetes mellitus with diabetic neuropathy, unspecified: Secondary | ICD-10-CM | POA: Diagnosis not present

## 2023-05-28 DIAGNOSIS — H9193 Unspecified hearing loss, bilateral: Secondary | ICD-10-CM | POA: Diagnosis not present

## 2023-05-28 DIAGNOSIS — N390 Urinary tract infection, site not specified: Secondary | ICD-10-CM | POA: Diagnosis not present

## 2023-05-28 DIAGNOSIS — I5032 Chronic diastolic (congestive) heart failure: Secondary | ICD-10-CM | POA: Diagnosis not present

## 2023-05-28 DIAGNOSIS — Z7982 Long term (current) use of aspirin: Secondary | ICD-10-CM | POA: Diagnosis not present

## 2023-05-28 DIAGNOSIS — I110 Hypertensive heart disease with heart failure: Secondary | ICD-10-CM | POA: Diagnosis not present

## 2023-05-28 DIAGNOSIS — J439 Emphysema, unspecified: Secondary | ICD-10-CM | POA: Diagnosis not present

## 2023-05-28 DIAGNOSIS — K5900 Constipation, unspecified: Secondary | ICD-10-CM | POA: Diagnosis not present

## 2023-05-28 DIAGNOSIS — E785 Hyperlipidemia, unspecified: Secondary | ICD-10-CM | POA: Diagnosis not present

## 2023-05-28 DIAGNOSIS — Z9049 Acquired absence of other specified parts of digestive tract: Secondary | ICD-10-CM | POA: Diagnosis not present

## 2023-05-28 DIAGNOSIS — Z8673 Personal history of transient ischemic attack (TIA), and cerebral infarction without residual deficits: Secondary | ICD-10-CM | POA: Diagnosis not present

## 2023-05-28 DIAGNOSIS — I2510 Atherosclerotic heart disease of native coronary artery without angina pectoris: Secondary | ICD-10-CM | POA: Diagnosis not present

## 2023-05-28 DIAGNOSIS — I252 Old myocardial infarction: Secondary | ICD-10-CM | POA: Diagnosis not present

## 2023-05-28 DIAGNOSIS — I10 Essential (primary) hypertension: Secondary | ICD-10-CM | POA: Diagnosis not present

## 2023-05-28 DIAGNOSIS — R338 Other retention of urine: Secondary | ICD-10-CM | POA: Diagnosis not present

## 2023-05-28 DIAGNOSIS — F0393 Unspecified dementia, unspecified severity, with mood disturbance: Secondary | ICD-10-CM | POA: Diagnosis not present

## 2023-05-29 DIAGNOSIS — I110 Hypertensive heart disease with heart failure: Secondary | ICD-10-CM | POA: Diagnosis not present

## 2023-05-29 DIAGNOSIS — I2510 Atherosclerotic heart disease of native coronary artery without angina pectoris: Secondary | ICD-10-CM | POA: Diagnosis not present

## 2023-05-29 DIAGNOSIS — I5032 Chronic diastolic (congestive) heart failure: Secondary | ICD-10-CM | POA: Diagnosis not present

## 2023-05-29 DIAGNOSIS — E1140 Type 2 diabetes mellitus with diabetic neuropathy, unspecified: Secondary | ICD-10-CM | POA: Diagnosis not present

## 2023-05-30 DIAGNOSIS — I110 Hypertensive heart disease with heart failure: Secondary | ICD-10-CM | POA: Diagnosis not present

## 2023-05-30 DIAGNOSIS — Z9049 Acquired absence of other specified parts of digestive tract: Secondary | ICD-10-CM | POA: Diagnosis not present

## 2023-05-30 DIAGNOSIS — F0393 Unspecified dementia, unspecified severity, with mood disturbance: Secondary | ICD-10-CM | POA: Diagnosis not present

## 2023-05-30 DIAGNOSIS — I10 Essential (primary) hypertension: Secondary | ICD-10-CM | POA: Diagnosis not present

## 2023-05-30 DIAGNOSIS — N390 Urinary tract infection, site not specified: Secondary | ICD-10-CM | POA: Diagnosis not present

## 2023-05-30 DIAGNOSIS — I2510 Atherosclerotic heart disease of native coronary artery without angina pectoris: Secondary | ICD-10-CM | POA: Diagnosis not present

## 2023-05-30 DIAGNOSIS — K5900 Constipation, unspecified: Secondary | ICD-10-CM | POA: Diagnosis not present

## 2023-05-30 DIAGNOSIS — Z8673 Personal history of transient ischemic attack (TIA), and cerebral infarction without residual deficits: Secondary | ICD-10-CM | POA: Diagnosis not present

## 2023-05-30 DIAGNOSIS — Z7982 Long term (current) use of aspirin: Secondary | ICD-10-CM | POA: Diagnosis not present

## 2023-05-30 DIAGNOSIS — J439 Emphysema, unspecified: Secondary | ICD-10-CM | POA: Diagnosis not present

## 2023-05-30 DIAGNOSIS — E785 Hyperlipidemia, unspecified: Secondary | ICD-10-CM | POA: Diagnosis not present

## 2023-05-30 DIAGNOSIS — R338 Other retention of urine: Secondary | ICD-10-CM | POA: Diagnosis not present

## 2023-05-30 DIAGNOSIS — I5032 Chronic diastolic (congestive) heart failure: Secondary | ICD-10-CM | POA: Diagnosis not present

## 2023-05-30 DIAGNOSIS — I252 Old myocardial infarction: Secondary | ICD-10-CM | POA: Diagnosis not present

## 2023-05-30 DIAGNOSIS — E1140 Type 2 diabetes mellitus with diabetic neuropathy, unspecified: Secondary | ICD-10-CM | POA: Diagnosis not present

## 2023-05-30 DIAGNOSIS — H9193 Unspecified hearing loss, bilateral: Secondary | ICD-10-CM | POA: Diagnosis not present

## 2023-06-03 ENCOUNTER — Other Ambulatory Visit: Payer: Self-pay

## 2023-06-03 ENCOUNTER — Encounter (HOSPITAL_COMMUNITY): Payer: Self-pay

## 2023-06-03 ENCOUNTER — Emergency Department (HOSPITAL_COMMUNITY)
Admission: EM | Admit: 2023-06-03 | Discharge: 2023-06-03 | Disposition: A | Discharge status: Home / Self Care | Payer: Medicare Other | Attending: Emergency Medicine | Admitting: Emergency Medicine

## 2023-06-03 DIAGNOSIS — Z743 Need for continuous supervision: Secondary | ICD-10-CM | POA: Diagnosis not present

## 2023-06-03 DIAGNOSIS — R739 Hyperglycemia, unspecified: Secondary | ICD-10-CM | POA: Diagnosis not present

## 2023-06-03 DIAGNOSIS — H9193 Unspecified hearing loss, bilateral: Secondary | ICD-10-CM | POA: Diagnosis not present

## 2023-06-03 DIAGNOSIS — Z794 Long term (current) use of insulin: Secondary | ICD-10-CM | POA: Insufficient documentation

## 2023-06-03 DIAGNOSIS — E1165 Type 2 diabetes mellitus with hyperglycemia: Secondary | ICD-10-CM | POA: Diagnosis not present

## 2023-06-03 DIAGNOSIS — I10 Essential (primary) hypertension: Secondary | ICD-10-CM | POA: Diagnosis not present

## 2023-06-03 DIAGNOSIS — E785 Hyperlipidemia, unspecified: Secondary | ICD-10-CM | POA: Diagnosis not present

## 2023-06-03 DIAGNOSIS — I252 Old myocardial infarction: Secondary | ICD-10-CM | POA: Diagnosis not present

## 2023-06-03 DIAGNOSIS — I2510 Atherosclerotic heart disease of native coronary artery without angina pectoris: Secondary | ICD-10-CM | POA: Diagnosis not present

## 2023-06-03 DIAGNOSIS — F0393 Unspecified dementia, unspecified severity, with mood disturbance: Secondary | ICD-10-CM | POA: Diagnosis not present

## 2023-06-03 DIAGNOSIS — E1140 Type 2 diabetes mellitus with diabetic neuropathy, unspecified: Secondary | ICD-10-CM | POA: Diagnosis not present

## 2023-06-03 DIAGNOSIS — I110 Hypertensive heart disease with heart failure: Secondary | ICD-10-CM | POA: Diagnosis not present

## 2023-06-03 DIAGNOSIS — Z7982 Long term (current) use of aspirin: Secondary | ICD-10-CM | POA: Diagnosis not present

## 2023-06-03 DIAGNOSIS — I5032 Chronic diastolic (congestive) heart failure: Secondary | ICD-10-CM | POA: Diagnosis not present

## 2023-06-03 DIAGNOSIS — Z79899 Other long term (current) drug therapy: Secondary | ICD-10-CM | POA: Diagnosis not present

## 2023-06-03 DIAGNOSIS — N390 Urinary tract infection, site not specified: Secondary | ICD-10-CM | POA: Diagnosis not present

## 2023-06-03 DIAGNOSIS — R338 Other retention of urine: Secondary | ICD-10-CM | POA: Diagnosis not present

## 2023-06-03 DIAGNOSIS — Z8673 Personal history of transient ischemic attack (TIA), and cerebral infarction without residual deficits: Secondary | ICD-10-CM | POA: Diagnosis not present

## 2023-06-03 DIAGNOSIS — J439 Emphysema, unspecified: Secondary | ICD-10-CM | POA: Diagnosis not present

## 2023-06-03 DIAGNOSIS — K5900 Constipation, unspecified: Secondary | ICD-10-CM | POA: Diagnosis not present

## 2023-06-03 DIAGNOSIS — Z9049 Acquired absence of other specified parts of digestive tract: Secondary | ICD-10-CM | POA: Diagnosis not present

## 2023-06-03 LAB — CBC WITH DIFFERENTIAL/PLATELET
Abs Immature Granulocytes: 0.02 10*3/uL (ref 0.00–0.07)
Basophils Absolute: 0 10*3/uL (ref 0.0–0.1)
Basophils Relative: 0 %
Eosinophils Absolute: 0.1 10*3/uL (ref 0.0–0.5)
Eosinophils Relative: 2 %
HCT: 36.8 % (ref 36.0–46.0)
Hemoglobin: 12 g/dL (ref 12.0–15.0)
Immature Granulocytes: 0 %
Lymphocytes Relative: 20 %
Lymphs Abs: 1.6 10*3/uL (ref 0.7–4.0)
MCH: 28.1 pg (ref 26.0–34.0)
MCHC: 32.6 g/dL (ref 30.0–36.0)
MCV: 86.2 fL (ref 80.0–100.0)
Monocytes Absolute: 0.6 10*3/uL (ref 0.1–1.0)
Monocytes Relative: 8 %
Neutro Abs: 5.3 10*3/uL (ref 1.7–7.7)
Neutrophils Relative %: 70 %
Platelets: 200 10*3/uL (ref 150–400)
RBC: 4.27 MIL/uL (ref 3.87–5.11)
RDW: 14.3 % (ref 11.5–15.5)
WBC: 7.7 10*3/uL (ref 4.0–10.5)
nRBC: 0 % (ref 0.0–0.2)

## 2023-06-03 LAB — URINALYSIS, ROUTINE W REFLEX MICROSCOPIC
Bacteria, UA: NONE SEEN
Bilirubin Urine: NEGATIVE
Glucose, UA: >=500 mg/dL — AB
Hgb urine dipstick: NEGATIVE
Ketones, ur: NEGATIVE mg/dL
Nitrite: NEGATIVE
Protein, ur: 30 mg/dL — AB
Specific Gravity, Urine: 1.018 (ref 1.005–1.030)
pH: 5 (ref 5.0–8.0)

## 2023-06-03 LAB — COMPREHENSIVE METABOLIC PANEL
ALT: 16 U/L (ref 0–44)
AST: 21 U/L (ref 15–41)
Albumin: 2.9 g/dL — ABNORMAL LOW (ref 3.5–5.0)
Alkaline Phosphatase: 84 U/L (ref 38–126)
Anion gap: 9 (ref 5–15)
BUN: 23 mg/dL (ref 8–23)
CO2: 23 mmol/L (ref 22–32)
Calcium: 9 mg/dL (ref 8.9–10.3)
Chloride: 103 mmol/L (ref 98–111)
Creatinine, Ser: 0.72 mg/dL (ref 0.44–1.00)
GFR, Estimated: >60 mL/min (ref >60)
Glucose, Bld: 231 mg/dL — ABNORMAL HIGH (ref 70–99)
Potassium: 3.1 mmol/L — ABNORMAL LOW (ref 3.5–5.1)
Sodium: 135 mmol/L (ref 135–145)
Total Bilirubin: 0.6 mg/dL (ref 0.3–1.2)
Total Protein: 6.1 g/dL — ABNORMAL LOW (ref 6.5–8.1)

## 2023-06-03 LAB — CBG MONITORING, ED
Glucose-Capillary: 265 mg/dL — ABNORMAL HIGH (ref 70–99)
Glucose-Capillary: 166 mg/dL — ABNORMAL HIGH (ref 70–99)

## 2023-06-03 MED ORDER — POTASSIUM CHLORIDE CRYS ER 20 MEQ PO TBCR
40.0000 meq | EXTENDED_RELEASE_TABLET | Freq: Once | ORAL | Status: AC
  Administered 2023-06-03: 40 meq via ORAL
  Filled 2023-06-03: qty 2

## 2023-06-03 MED ORDER — SODIUM CHLORIDE 0.9 % IV BOLUS
500.0000 mL | Freq: Once | INTRAVENOUS | Status: AC
  Administered 2023-06-03: 500 mL via INTRAVENOUS

## 2023-06-03 NOTE — Discharge Instructions (Signed)
Your blood sugar today was slightly elevated.  Currently is 166.  Take your diabetes medication as directed.  Please call Dr. Lamar Blinks office to arrange a follow-up appointment.  Return to the emergency department for any new or worsening symptoms.

## 2023-06-03 NOTE — ED Triage Notes (Signed)
Pt bib [facility] for hyperglycemia. Pt states she's been urinating more frequently the last few days. CBG with EMS = 337. CBG 265 upon arrival to ED

## 2023-06-03 NOTE — ED Provider Notes (Signed)
[place] EMERGENCY DEPARTMENT AT [facility] Provider Note   CSN: [id number] Arrival date & time: 06/03/23  1408     History  Chief Complaint  Patient presents with   Hyperglycemia    EMS- 337 APED- 265    [name] is a 89 y.o. female.   Hyperglycemia Associated symptoms: no abdominal pain, no chest pain, no confusion, no dizziness, no dysuria, no fever, no nausea, no shortness of breath, no vomiting and no weakness        [name] is a 89 y.o. female with history of DM, HTN, previous stroke, CAD, diastolic dysfunction who presents to the Emergency Department for evaluation of elevated blood sugar.   Patient's daughter provides most of history.  The daughter states that home health nurse checked her blood sugar on the morning of arrival and sugar was 500.  Patient states that it was taken shortly after she ate breakfast.  Patient reports some frequent urination, but otherwise denies any symptoms.  Daughter is concerned about possible UTI.   Home Medications Prior to Admission medications   Medication Sig Start Date End Date Taking? Authorizing Provider  acetaminophen (TYLENOL) 500 MG tablet Take 500 mg by mouth every 6 (six) hours as needed for mild pain.    [provider]  aspirin EC 81 MG tablet Take 1 tablet (81 mg total) by mouth daily. Swallow whole. 01/05/23   Dunn, Tacey Ruiz, PA-C  blood glucose meter kit and supplies KIT Dispense based on patient and insurance preference. Use to TEST BLOOD GLUCOSE THREE times daily as directed. DX E11.65 02/10/21   Roma Kayser, MD  Cholecalciferol (VITAMIN D3) 5000 units CAPS Take 1 capsule (5,000 Units total) by mouth daily. 04/15/17   Roma Kayser, MD  diclofenac Sodium (VOLTAREN ARTHRITIS PAIN) 1 % GEL Apply 2 g topically 3 (three) times daily. bilateral knees for osteoarthritis 07/25/22   Sharee Holster, NP  gabapentin (NEURONTIN) 300 MG capsule Take 1 capsule (300 mg total) by  mouth 2 (two) times daily. Take 300 mg three times daily 05/05/23   Vassie Loll, MD  Insulin Pen Needle (BD AUTOSHIELD DUO) 30G X 5 MM MISC by Does not apply route as directed. 30 gauge X 3/16    [provider]  metoprolol succinate (TOPROL-XL) 50 MG 24 hr tablet Take 50 mg by mouth at bedtime. 04/01/23   [provider]  nitroGLYCERIN (NITROSTAT) 0.4 MG SL tablet Place 1 tablet (0.4 mg total) under the tongue every 5 (five) minutes as needed for chest pain (up to 3 doses. If taking 3rd dose, call 911). 01/04/23   Dunn, Tacey Ruiz, PA-C  rosuvastatin (CRESTOR) 20 MG tablet Take 1 tablet (20 mg total) by mouth daily. 01/05/23   Dunn, Tacey Ruiz, PA-C  sertraline (ZOLOFT) 25 MG tablet Take 25 mg by mouth daily. Take with 50mg  dose to equal 75mg  daily.    [provider]  sertraline (ZOLOFT) 50 MG tablet Take 50 mg by mouth daily. Take with 25mg  dose to equal 75mg  daily.    [provider]  silodosin (RAPAFLO) 8 MG CAPS capsule Take 1 capsule (8 mg total) by mouth daily with breakfast. 10/10/22   Summerlin, Regan Rakers, PA-C  ticagrelor (BRILINTA) 90 MG TABS tablet Take 1 tablet (90 mg total) by mouth 2 (two) times daily. 01/04/23   Dunn, Tacey Ruiz, PA-C  Trospium Chloride 60 MG CP24 Take 1 capsule by mouth daily. 04/01/23   [provider]  zolpidem (AMBIEN) 10 MG tablet Take 10 mg by mouth at bedtime as needed for sleep. 01/22/23   [provider]      Allergies    Codeine, Glipizide, and Sulfa antibiotics    Review of Systems   Review of Systems  Constitutional:  Negative for appetite change, chills and fever.  Eyes:  Negative for visual disturbance.  Respiratory:  Negative for shortness of breath.   Cardiovascular:  Negative for chest pain.  Gastrointestinal:  Negative for abdominal pain, nausea and vomiting.  Genitourinary:  Positive for frequency. Negative for decreased urine volume, difficulty urinating and dysuria.  Musculoskeletal:  Negative  for arthralgias and neck pain.  Skin:  Negative for rash.  Neurological:  Negative for dizziness, facial asymmetry, weakness, numbness and headaches.  Psychiatric/Behavioral:  Negative for confusion.     Physical Exam Updated Vital Signs BP (!) 143/68   Pulse 64   Temp 98.2 F (36.8 C) (Oral)   Resp 16   Ht 5\' 9"  (1.753 m)   Wt 72.6 kg   SpO2 99%   BMI 23.63 kg/m  Physical Exam Vitals and nursing note reviewed.  Constitutional:      General: She is not in acute distress.    Appearance: Normal appearance. She is not ill-appearing.  HENT:     Head: Atraumatic.     Mouth/Throat:     Mouth: Mucous membranes are moist.     Pharynx: Oropharynx is clear.  Eyes:     Extraocular Movements: Extraocular movements intact.     Conjunctiva/sclera: Conjunctivae normal.     Pupils: Pupils are equal, round, and reactive to light.  Cardiovascular:     Rate and Rhythm: Normal rate and regular rhythm.     Pulses: Normal pulses.  Pulmonary:     Effort: Pulmonary effort is normal. No respiratory distress.     Breath sounds: Normal breath sounds.  Abdominal:     Palpations: Abdomen is soft.     Tenderness: There is no abdominal tenderness.  Musculoskeletal:        General: Normal range of motion.     Cervical back: Normal range of motion.  Lymphadenopathy:     Cervical: No cervical adenopathy.  Skin:    General: Skin is warm.     Capillary Refill: Capillary refill takes less than 2 seconds.     Findings: No rash.  Neurological:     General: No focal deficit present.     Mental Status: She is alert.     Sensory: No sensory deficit.     Motor: No weakness.  Psychiatric:        Behavior: Behavior normal.        Thought Content: Thought content normal.     ED Results / Procedures / Treatments   Labs (all labs ordered are listed, but only abnormal results are displayed) Labs Reviewed  COMPREHENSIVE METABOLIC PANEL - Abnormal; Notable for the following components:      Result  Value   Potassium 3.1 (*)    Glucose, Bld 231 (*)    Total Protein 6.1 (*)    Albumin 2.9 (*)    All other components within normal limits  URINALYSIS, ROUTINE W REFLEX MICROSCOPIC - Abnormal; Notable for the following components:   Glucose, UA >=500 (*)    Protein, ur 30 (*)    Leukocytes,Ua TRACE (*)    All other components within normal limits  CBG MONITORING, ED - Abnormal; Notable for the following components:  Glucose-Capillary 265 (*)    All other components within normal limits  CBG MONITORING, ED - Abnormal; Notable for the following components:   Glucose-Capillary 166 (*)    All other components within normal limits  CBC WITH DIFFERENTIAL/PLATELET    EKG EKG Interpretation  Date/Time:  Monday June 03 2023 14:19:00 EDT Ventricular Rate:  81 PR Interval:  172 QRS Duration: 124 QT Interval:  392 QTC Calculation: 455 R Axis:   87 Text Interpretation: Sinus rhythm Right bundle branch block Inferior infarct, age indeterminate Anterolateral infarct, age indeterminate Confirmed by Zadie Rhine ([id number]) on 06/04/2023 9:17:41 AM  Radiology No results found.  Procedures Procedures    Medications Ordered in ED Medications  sodium chloride 0.9 % bolus 500 mL (0 mLs Intravenous Stopped 06/03/23 1735)  potassium chloride SA (KLOR-CON M) CR tablet 40 mEq (40 mEq Oral Given 06/03/23 2003)    ED Course/ Medical Decision Making/ A&P                             Medical Decision Making  Pt here for complaint of hyperglycemia and urinary frequency.  CBG 231 on arrival.    Pt well appearing.   Vitals reassuring.  Diff includes hyperglycemia secondary to infection, DKA, HHS  Amount and/or Complexity of Data Reviewed Labs: ordered.    Details: Initial CBG on arrival 265, labs no evidence of leukocytosis, hemoglobin reassuring.  Chemistries show mild hypokalemia and blood sugar 231 with reassuring anion gap, bicarb unremarkable.  Urinalysis shows glucosuria without evidence  of infection.  After IV fluids, her repeat CBG was 166 ECG/medicine tests: ordered.    Details: EKG shows sinus rhythm with right bundle branch block Discussion of management or test interpretation with external provider(s): Patient here with concern of hyperglycemia.  No evidence of DKA or HHS on arrival.  Patient alert and oriented.  Denies any symptoms  She has been given IV fluids here, workup reassuring.  She does have some mild hypokalemia oral potassium given for this.  She appears appropriate for discharge home, will follow-up closely with PCP.  Return precautions were also discussed  Risk Prescription drug management.           Final Clinical Impression(s) / ED Diagnoses Final diagnoses:  Hyperglycemia    Rx / DC Orders ED Discharge Orders     None         Pauline Aus, Cordelia Poche 06/06/23 1535    Pricilla Loveless, MD 06/11/23 [id number]

## 2023-06-04 DIAGNOSIS — K5900 Constipation, unspecified: Secondary | ICD-10-CM | POA: Diagnosis not present

## 2023-06-04 DIAGNOSIS — E1140 Type 2 diabetes mellitus with diabetic neuropathy, unspecified: Secondary | ICD-10-CM | POA: Diagnosis not present

## 2023-06-04 DIAGNOSIS — Z9049 Acquired absence of other specified parts of digestive tract: Secondary | ICD-10-CM | POA: Diagnosis not present

## 2023-06-04 DIAGNOSIS — J439 Emphysema, unspecified: Secondary | ICD-10-CM | POA: Diagnosis not present

## 2023-06-04 DIAGNOSIS — R338 Other retention of urine: Secondary | ICD-10-CM | POA: Diagnosis not present

## 2023-06-04 DIAGNOSIS — I2510 Atherosclerotic heart disease of native coronary artery without angina pectoris: Secondary | ICD-10-CM | POA: Diagnosis not present

## 2023-06-04 DIAGNOSIS — I252 Old myocardial infarction: Secondary | ICD-10-CM | POA: Diagnosis not present

## 2023-06-04 DIAGNOSIS — I10 Essential (primary) hypertension: Secondary | ICD-10-CM | POA: Diagnosis not present

## 2023-06-04 DIAGNOSIS — Z8673 Personal history of transient ischemic attack (TIA), and cerebral infarction without residual deficits: Secondary | ICD-10-CM | POA: Diagnosis not present

## 2023-06-04 DIAGNOSIS — I5032 Chronic diastolic (congestive) heart failure: Secondary | ICD-10-CM | POA: Diagnosis not present

## 2023-06-04 DIAGNOSIS — F0393 Unspecified dementia, unspecified severity, with mood disturbance: Secondary | ICD-10-CM | POA: Diagnosis not present

## 2023-06-04 DIAGNOSIS — I110 Hypertensive heart disease with heart failure: Secondary | ICD-10-CM | POA: Diagnosis not present

## 2023-06-04 DIAGNOSIS — Z7982 Long term (current) use of aspirin: Secondary | ICD-10-CM | POA: Diagnosis not present

## 2023-06-04 DIAGNOSIS — H9193 Unspecified hearing loss, bilateral: Secondary | ICD-10-CM | POA: Diagnosis not present

## 2023-06-04 DIAGNOSIS — N390 Urinary tract infection, site not specified: Secondary | ICD-10-CM | POA: Diagnosis not present

## 2023-06-04 DIAGNOSIS — E785 Hyperlipidemia, unspecified: Secondary | ICD-10-CM | POA: Diagnosis not present

## 2023-06-05 DIAGNOSIS — M6281 Muscle weakness (generalized): Secondary | ICD-10-CM | POA: Diagnosis not present

## 2023-06-05 DIAGNOSIS — G9341 Metabolic encephalopathy: Secondary | ICD-10-CM | POA: Diagnosis not present

## 2023-06-05 DIAGNOSIS — E1140 Type 2 diabetes mellitus with diabetic neuropathy, unspecified: Secondary | ICD-10-CM | POA: Diagnosis not present

## 2023-06-05 DIAGNOSIS — I10 Essential (primary) hypertension: Secondary | ICD-10-CM | POA: Diagnosis not present

## 2023-06-05 DIAGNOSIS — L03317 Cellulitis of buttock: Secondary | ICD-10-CM | POA: Diagnosis not present

## 2023-06-06 DIAGNOSIS — E1140 Type 2 diabetes mellitus with diabetic neuropathy, unspecified: Secondary | ICD-10-CM | POA: Diagnosis not present

## 2023-06-06 DIAGNOSIS — Z8673 Personal history of transient ischemic attack (TIA), and cerebral infarction without residual deficits: Secondary | ICD-10-CM | POA: Diagnosis not present

## 2023-06-06 DIAGNOSIS — F0393 Unspecified dementia, unspecified severity, with mood disturbance: Secondary | ICD-10-CM | POA: Diagnosis not present

## 2023-06-06 DIAGNOSIS — I5032 Chronic diastolic (congestive) heart failure: Secondary | ICD-10-CM | POA: Diagnosis not present

## 2023-06-06 DIAGNOSIS — E785 Hyperlipidemia, unspecified: Secondary | ICD-10-CM | POA: Diagnosis not present

## 2023-06-06 DIAGNOSIS — R338 Other retention of urine: Secondary | ICD-10-CM | POA: Diagnosis not present

## 2023-06-06 DIAGNOSIS — I2510 Atherosclerotic heart disease of native coronary artery without angina pectoris: Secondary | ICD-10-CM | POA: Diagnosis not present

## 2023-06-06 DIAGNOSIS — K5900 Constipation, unspecified: Secondary | ICD-10-CM | POA: Diagnosis not present

## 2023-06-06 DIAGNOSIS — I110 Hypertensive heart disease with heart failure: Secondary | ICD-10-CM | POA: Diagnosis not present

## 2023-06-06 DIAGNOSIS — Z7982 Long term (current) use of aspirin: Secondary | ICD-10-CM | POA: Diagnosis not present

## 2023-06-06 DIAGNOSIS — I10 Essential (primary) hypertension: Secondary | ICD-10-CM | POA: Diagnosis not present

## 2023-06-06 DIAGNOSIS — N390 Urinary tract infection, site not specified: Secondary | ICD-10-CM | POA: Diagnosis not present

## 2023-06-06 DIAGNOSIS — H9193 Unspecified hearing loss, bilateral: Secondary | ICD-10-CM | POA: Diagnosis not present

## 2023-06-06 DIAGNOSIS — I252 Old myocardial infarction: Secondary | ICD-10-CM | POA: Diagnosis not present

## 2023-06-06 DIAGNOSIS — Z9049 Acquired absence of other specified parts of digestive tract: Secondary | ICD-10-CM | POA: Diagnosis not present

## 2023-06-06 DIAGNOSIS — J439 Emphysema, unspecified: Secondary | ICD-10-CM | POA: Diagnosis not present

## 2023-06-07 ENCOUNTER — Telehealth: Payer: Self-pay

## 2023-06-07 NOTE — Telephone Encounter (Signed)
Transition Care Management Unsuccessful Follow-up Telephone Call  Date of discharge and from where:  06/03/2023 [facility]  Attempts:  1st Attempt  Reason for unsuccessful TCM follow-up call:  No answer/busy  Dorlene Footman [facility]  [facility] Population Health Community Resource Care Guide   [email or web address]  ?? [phone]   Website: [email or web address]  [email or web address]

## 2023-06-10 ENCOUNTER — Telehealth: Payer: Self-pay

## 2023-06-10 NOTE — Telephone Encounter (Signed)
Transition Care Management Unsuccessful Follow-up Telephone Call  Date of discharge and from where:  06/03/2023 [facility]  Attempts:  2nd Attempt  Reason for unsuccessful TCM follow-up call:  Unable to leave message  Aiva Miskell [facility]  [facility] Population Health Community Resource Care Guide   [email or web address]  ?? [phone]   Website: [email or web address]  [email or web address]

## 2023-06-13 DIAGNOSIS — E1159 Type 2 diabetes mellitus with other circulatory complications: Secondary | ICD-10-CM | POA: Diagnosis not present

## 2023-06-13 DIAGNOSIS — Z7401 Bed confinement status: Secondary | ICD-10-CM | POA: Diagnosis not present

## 2023-06-14 DIAGNOSIS — Z7982 Long term (current) use of aspirin: Secondary | ICD-10-CM | POA: Diagnosis not present

## 2023-06-14 DIAGNOSIS — I2510 Atherosclerotic heart disease of native coronary artery without angina pectoris: Secondary | ICD-10-CM | POA: Diagnosis not present

## 2023-06-14 DIAGNOSIS — J439 Emphysema, unspecified: Secondary | ICD-10-CM | POA: Diagnosis not present

## 2023-06-14 DIAGNOSIS — Z9049 Acquired absence of other specified parts of digestive tract: Secondary | ICD-10-CM | POA: Diagnosis not present

## 2023-06-14 DIAGNOSIS — H9193 Unspecified hearing loss, bilateral: Secondary | ICD-10-CM | POA: Diagnosis not present

## 2023-06-14 DIAGNOSIS — I252 Old myocardial infarction: Secondary | ICD-10-CM | POA: Diagnosis not present

## 2023-06-14 DIAGNOSIS — I110 Hypertensive heart disease with heart failure: Secondary | ICD-10-CM | POA: Diagnosis not present

## 2023-06-14 DIAGNOSIS — E1140 Type 2 diabetes mellitus with diabetic neuropathy, unspecified: Secondary | ICD-10-CM | POA: Diagnosis not present

## 2023-06-14 DIAGNOSIS — R338 Other retention of urine: Secondary | ICD-10-CM | POA: Diagnosis not present

## 2023-06-14 DIAGNOSIS — N390 Urinary tract infection, site not specified: Secondary | ICD-10-CM | POA: Diagnosis not present

## 2023-06-14 DIAGNOSIS — E785 Hyperlipidemia, unspecified: Secondary | ICD-10-CM | POA: Diagnosis not present

## 2023-06-14 DIAGNOSIS — I5032 Chronic diastolic (congestive) heart failure: Secondary | ICD-10-CM | POA: Diagnosis not present

## 2023-06-14 DIAGNOSIS — F0393 Unspecified dementia, unspecified severity, with mood disturbance: Secondary | ICD-10-CM | POA: Diagnosis not present

## 2023-06-14 DIAGNOSIS — I10 Essential (primary) hypertension: Secondary | ICD-10-CM | POA: Diagnosis not present

## 2023-06-14 DIAGNOSIS — Z8673 Personal history of transient ischemic attack (TIA), and cerebral infarction without residual deficits: Secondary | ICD-10-CM | POA: Diagnosis not present

## 2023-06-14 DIAGNOSIS — K5900 Constipation, unspecified: Secondary | ICD-10-CM | POA: Diagnosis not present

## 2023-06-21 DIAGNOSIS — Z8673 Personal history of transient ischemic attack (TIA), and cerebral infarction without residual deficits: Secondary | ICD-10-CM | POA: Diagnosis not present

## 2023-06-21 DIAGNOSIS — H9193 Unspecified hearing loss, bilateral: Secondary | ICD-10-CM | POA: Diagnosis not present

## 2023-06-21 DIAGNOSIS — I110 Hypertensive heart disease with heart failure: Secondary | ICD-10-CM | POA: Diagnosis not present

## 2023-06-21 DIAGNOSIS — I10 Essential (primary) hypertension: Secondary | ICD-10-CM | POA: Diagnosis not present

## 2023-06-21 DIAGNOSIS — R338 Other retention of urine: Secondary | ICD-10-CM | POA: Diagnosis not present

## 2023-06-21 DIAGNOSIS — N390 Urinary tract infection, site not specified: Secondary | ICD-10-CM | POA: Diagnosis not present

## 2023-06-21 DIAGNOSIS — Z9049 Acquired absence of other specified parts of digestive tract: Secondary | ICD-10-CM | POA: Diagnosis not present

## 2023-06-21 DIAGNOSIS — E1140 Type 2 diabetes mellitus with diabetic neuropathy, unspecified: Secondary | ICD-10-CM | POA: Diagnosis not present

## 2023-06-21 DIAGNOSIS — E785 Hyperlipidemia, unspecified: Secondary | ICD-10-CM | POA: Diagnosis not present

## 2023-06-21 DIAGNOSIS — K5900 Constipation, unspecified: Secondary | ICD-10-CM | POA: Diagnosis not present

## 2023-06-21 DIAGNOSIS — F0393 Unspecified dementia, unspecified severity, with mood disturbance: Secondary | ICD-10-CM | POA: Diagnosis not present

## 2023-06-21 DIAGNOSIS — Z7982 Long term (current) use of aspirin: Secondary | ICD-10-CM | POA: Diagnosis not present

## 2023-06-21 DIAGNOSIS — J439 Emphysema, unspecified: Secondary | ICD-10-CM | POA: Diagnosis not present

## 2023-06-21 DIAGNOSIS — I252 Old myocardial infarction: Secondary | ICD-10-CM | POA: Diagnosis not present

## 2023-06-21 DIAGNOSIS — I2510 Atherosclerotic heart disease of native coronary artery without angina pectoris: Secondary | ICD-10-CM | POA: Diagnosis not present

## 2023-06-21 DIAGNOSIS — I5032 Chronic diastolic (congestive) heart failure: Secondary | ICD-10-CM | POA: Diagnosis not present

## 2023-06-24 DIAGNOSIS — Z9049 Acquired absence of other specified parts of digestive tract: Secondary | ICD-10-CM | POA: Diagnosis not present

## 2023-06-24 DIAGNOSIS — I2510 Atherosclerotic heart disease of native coronary artery without angina pectoris: Secondary | ICD-10-CM | POA: Diagnosis not present

## 2023-06-24 DIAGNOSIS — K5900 Constipation, unspecified: Secondary | ICD-10-CM | POA: Diagnosis not present

## 2023-06-24 DIAGNOSIS — F0393 Unspecified dementia, unspecified severity, with mood disturbance: Secondary | ICD-10-CM | POA: Diagnosis not present

## 2023-06-24 DIAGNOSIS — I10 Essential (primary) hypertension: Secondary | ICD-10-CM | POA: Diagnosis not present

## 2023-06-24 DIAGNOSIS — R338 Other retention of urine: Secondary | ICD-10-CM | POA: Diagnosis not present

## 2023-06-24 DIAGNOSIS — E1140 Type 2 diabetes mellitus with diabetic neuropathy, unspecified: Secondary | ICD-10-CM | POA: Diagnosis not present

## 2023-06-24 DIAGNOSIS — Z7982 Long term (current) use of aspirin: Secondary | ICD-10-CM | POA: Diagnosis not present

## 2023-06-24 DIAGNOSIS — E785 Hyperlipidemia, unspecified: Secondary | ICD-10-CM | POA: Diagnosis not present

## 2023-06-24 DIAGNOSIS — J439 Emphysema, unspecified: Secondary | ICD-10-CM | POA: Diagnosis not present

## 2023-06-24 DIAGNOSIS — I252 Old myocardial infarction: Secondary | ICD-10-CM | POA: Diagnosis not present

## 2023-06-24 DIAGNOSIS — I110 Hypertensive heart disease with heart failure: Secondary | ICD-10-CM | POA: Diagnosis not present

## 2023-06-24 DIAGNOSIS — Z8673 Personal history of transient ischemic attack (TIA), and cerebral infarction without residual deficits: Secondary | ICD-10-CM | POA: Diagnosis not present

## 2023-06-24 DIAGNOSIS — N390 Urinary tract infection, site not specified: Secondary | ICD-10-CM | POA: Diagnosis not present

## 2023-06-24 DIAGNOSIS — I5032 Chronic diastolic (congestive) heart failure: Secondary | ICD-10-CM | POA: Diagnosis not present

## 2023-06-24 DIAGNOSIS — H9193 Unspecified hearing loss, bilateral: Secondary | ICD-10-CM | POA: Diagnosis not present

## 2023-06-25 DIAGNOSIS — R296 Repeated falls: Secondary | ICD-10-CM | POA: Diagnosis not present

## 2023-06-25 DIAGNOSIS — Z7401 Bed confinement status: Secondary | ICD-10-CM | POA: Diagnosis not present

## 2023-06-25 DIAGNOSIS — R54 Age-related physical debility: Secondary | ICD-10-CM | POA: Diagnosis not present

## 2023-06-28 ENCOUNTER — Other Ambulatory Visit: Payer: Self-pay

## 2023-06-28 ENCOUNTER — Encounter (HOSPITAL_COMMUNITY): Payer: Self-pay

## 2023-06-28 ENCOUNTER — Observation Stay (HOSPITAL_COMMUNITY)
Admission: EM | Admit: 2023-06-28 | Discharge: 2023-06-29 | Disposition: A | Discharge status: Home Health | Payer: Medicare Other | Attending: Family Medicine | Admitting: Family Medicine

## 2023-06-28 ENCOUNTER — Emergency Department (HOSPITAL_COMMUNITY): Payer: Medicare Other

## 2023-06-28 DIAGNOSIS — K5900 Constipation, unspecified: Secondary | ICD-10-CM

## 2023-06-28 DIAGNOSIS — K5649 Other impaction of intestine: Secondary | ICD-10-CM | POA: Diagnosis not present

## 2023-06-28 DIAGNOSIS — Z743 Need for continuous supervision: Secondary | ICD-10-CM | POA: Diagnosis not present

## 2023-06-28 DIAGNOSIS — Z7984 Long term (current) use of oral hypoglycemic drugs: Secondary | ICD-10-CM | POA: Diagnosis not present

## 2023-06-28 DIAGNOSIS — Z7982 Long term (current) use of aspirin: Secondary | ICD-10-CM | POA: Diagnosis not present

## 2023-06-28 DIAGNOSIS — K529 Noninfective gastroenteritis and colitis, unspecified: Secondary | ICD-10-CM | POA: Diagnosis not present

## 2023-06-28 DIAGNOSIS — R6889 Other general symptoms and signs: Secondary | ICD-10-CM | POA: Diagnosis not present

## 2023-06-28 DIAGNOSIS — E1140 Type 2 diabetes mellitus with diabetic neuropathy, unspecified: Secondary | ICD-10-CM | POA: Diagnosis present

## 2023-06-28 DIAGNOSIS — I2510 Atherosclerotic heart disease of native coronary artery without angina pectoris: Secondary | ICD-10-CM | POA: Insufficient documentation

## 2023-06-28 DIAGNOSIS — E782 Mixed hyperlipidemia: Secondary | ICD-10-CM | POA: Diagnosis not present

## 2023-06-28 DIAGNOSIS — E1165 Type 2 diabetes mellitus with hyperglycemia: Secondary | ICD-10-CM | POA: Diagnosis present

## 2023-06-28 DIAGNOSIS — I10 Essential (primary) hypertension: Secondary | ICD-10-CM | POA: Diagnosis not present

## 2023-06-28 DIAGNOSIS — F0390 Unspecified dementia without behavioral disturbance: Secondary | ICD-10-CM | POA: Diagnosis present

## 2023-06-28 DIAGNOSIS — Z66 Do not resuscitate: Secondary | ICD-10-CM | POA: Diagnosis present

## 2023-06-28 DIAGNOSIS — J449 Chronic obstructive pulmonary disease, unspecified: Secondary | ICD-10-CM | POA: Diagnosis not present

## 2023-06-28 DIAGNOSIS — E785 Hyperlipidemia, unspecified: Secondary | ICD-10-CM | POA: Diagnosis present

## 2023-06-28 DIAGNOSIS — Z8673 Personal history of transient ischemic attack (TIA), and cerebral infarction without residual deficits: Secondary | ICD-10-CM | POA: Diagnosis not present

## 2023-06-28 DIAGNOSIS — K5641 Fecal impaction: Secondary | ICD-10-CM | POA: Diagnosis not present

## 2023-06-28 DIAGNOSIS — R109 Unspecified abdominal pain: Secondary | ICD-10-CM | POA: Diagnosis not present

## 2023-06-28 DIAGNOSIS — Z8659 Personal history of other mental and behavioral disorders: Secondary | ICD-10-CM

## 2023-06-28 DIAGNOSIS — R1030 Lower abdominal pain, unspecified: Secondary | ICD-10-CM

## 2023-06-28 LAB — COMPREHENSIVE METABOLIC PANEL
ALT: 15 U/L (ref 0–44)
AST: 18 U/L (ref 15–41)
Albumin: 3.2 g/dL — ABNORMAL LOW (ref 3.5–5.0)
Alkaline Phosphatase: 98 U/L (ref 38–126)
Anion gap: 8 (ref 5–15)
BUN: 18 mg/dL (ref 8–23)
CO2: 25 mmol/L (ref 22–32)
Calcium: 9.1 mg/dL (ref 8.9–10.3)
Chloride: 104 mmol/L (ref 98–111)
Creatinine, Ser: 0.78 mg/dL (ref 0.44–1.00)
GFR, Estimated: >60 mL/min (ref >60)
Glucose, Bld: 153 mg/dL — ABNORMAL HIGH (ref 70–99)
Potassium: 3.7 mmol/L (ref 3.5–5.1)
Sodium: 137 mmol/L (ref 135–145)
Total Bilirubin: 0.4 mg/dL (ref 0.3–1.2)
Total Protein: 6.3 g/dL — ABNORMAL LOW (ref 6.5–8.1)

## 2023-06-28 LAB — URINALYSIS, ROUTINE W REFLEX MICROSCOPIC
Bilirubin Urine: NEGATIVE
Glucose, UA: NEGATIVE mg/dL
Hgb urine dipstick: NEGATIVE
Ketones, ur: NEGATIVE mg/dL
Leukocytes,Ua: NEGATIVE
Nitrite: NEGATIVE
Protein, ur: NEGATIVE mg/dL
Specific Gravity, Urine: 1.023 (ref 1.005–1.030)
pH: 7 (ref 5.0–8.0)

## 2023-06-28 LAB — CBC
HCT: 40 % (ref 36.0–46.0)
Hemoglobin: 12.7 g/dL (ref 12.0–15.0)
MCH: 27.8 pg (ref 26.0–34.0)
MCHC: 31.8 g/dL (ref 30.0–36.0)
MCV: 87.5 fL (ref 80.0–100.0)
Platelets: 198 10*3/uL (ref 150–400)
RBC: 4.57 MIL/uL (ref 3.87–5.11)
RDW: 15.2 % (ref 11.5–15.5)
WBC: 7.3 10*3/uL (ref 4.0–10.5)
nRBC: 0 % (ref 0.0–0.2)

## 2023-06-28 LAB — GLUCOSE, CAPILLARY
Glucose-Capillary: 111 mg/dL — ABNORMAL HIGH (ref 70–99)
Glucose-Capillary: 157 mg/dL — ABNORMAL HIGH (ref 70–99)

## 2023-06-28 LAB — LIPASE, BLOOD: Lipase: 30 U/L (ref 11–51)

## 2023-06-28 MED ORDER — SENNA 8.6 MG PO TABS
1.0000 | ORAL_TABLET | Freq: Every day | ORAL | Status: DC
  Administered 2023-06-28: 8.6 mg via ORAL
  Filled 2023-06-28: qty 1

## 2023-06-28 MED ORDER — TAMSULOSIN HCL 0.4 MG PO CAPS
0.4000 mg | ORAL_CAPSULE | Freq: Every day | ORAL | Status: DC
  Administered 2023-06-29: 0.4 mg via ORAL
  Filled 2023-06-28: qty 1

## 2023-06-28 MED ORDER — OXYCODONE HCL 5 MG PO TABS: 2.5000 mg | ORAL_TABLET | ORAL | Status: DC | PRN

## 2023-06-28 MED ORDER — SERTRALINE HCL 50 MG PO TABS
75.0000 mg | ORAL_TABLET | Freq: Every day | ORAL | Status: DC
  Administered 2023-06-29: 75 mg via ORAL
  Filled 2023-06-28: qty 2

## 2023-06-28 MED ORDER — HEPARIN SODIUM (PORCINE) 5000 UNIT/ML IJ SOLN
5000.0000 [IU] | Freq: Three times a day (TID) | INTRAMUSCULAR | Status: DC
  Administered 2023-06-28 – 2023-06-29 (×2): 5000 [IU] via SUBCUTANEOUS
  Filled 2023-06-28 (×2): qty 1

## 2023-06-28 MED ORDER — GABAPENTIN 300 MG PO CAPS
300.0000 mg | ORAL_CAPSULE | Freq: Two times a day (BID) | ORAL | Status: DC
  Administered 2023-06-28 – 2023-06-29 (×3): 300 mg via ORAL
  Filled 2023-06-28 (×3): qty 1

## 2023-06-28 MED ORDER — DICLOFENAC SODIUM 1 % EX GEL
2.0000 g | Freq: Three times a day (TID) | CUTANEOUS | Status: DC
  Administered 2023-06-28 – 2023-06-29 (×3): 2 g via TOPICAL
  Filled 2023-06-28 (×3): qty 100

## 2023-06-28 MED ORDER — ASPIRIN 81 MG PO TBEC
81.0000 mg | DELAYED_RELEASE_TABLET | Freq: Every day | ORAL | Status: DC
  Administered 2023-06-29: 81 mg via ORAL
  Filled 2023-06-28: qty 1

## 2023-06-28 MED ORDER — MILK AND MOLASSES ENEMA: 1.0000 | Freq: Once | RECTAL | Status: DC

## 2023-06-28 MED ORDER — TICAGRELOR 90 MG PO TABS
90.0000 mg | ORAL_TABLET | Freq: Two times a day (BID) | ORAL | Status: DC
  Administered 2023-06-28 – 2023-06-29 (×3): 90 mg via ORAL
  Filled 2023-06-28 (×3): qty 1

## 2023-06-28 MED ORDER — ACETAMINOPHEN 650 MG RE SUPP: 650.0000 mg | Freq: Four times a day (QID) | RECTAL | Status: DC | PRN

## 2023-06-28 MED ORDER — FENTANYL CITRATE PF 50 MCG/ML IJ SOSY: 12.5000 ug | PREFILLED_SYRINGE | INTRAMUSCULAR | Status: DC | PRN

## 2023-06-28 MED ORDER — FESOTERODINE FUMARATE ER 4 MG PO TB24
4.0000 mg | ORAL_TABLET | Freq: Every day | ORAL | Status: DC
  Administered 2023-06-28 – 2023-06-29 (×2): 4 mg via ORAL
  Filled 2023-06-28 (×6): qty 1

## 2023-06-28 MED ORDER — POLYETHYLENE GLYCOL 3350 17 G PO PACK
17.0000 g | PACK | Freq: Two times a day (BID) | ORAL | Status: DC
  Administered 2023-06-28 – 2023-06-29 (×3): 17 g via ORAL
  Filled 2023-06-28 (×3): qty 1

## 2023-06-28 MED ORDER — ZINC OXIDE 40 % EX OINT
TOPICAL_OINTMENT | CUTANEOUS | Status: AC
  Filled 2023-06-28: qty 57

## 2023-06-28 MED ORDER — ONDANSETRON HCL 4 MG PO TABS: 4.0000 mg | ORAL_TABLET | Freq: Four times a day (QID) | ORAL | Status: DC | PRN

## 2023-06-28 MED ORDER — INSULIN ASPART 100 UNIT/ML IJ SOLN
0.0000 [IU] | Freq: Three times a day (TID) | INTRAMUSCULAR | Status: DC
  Administered 2023-06-29: 2 [IU] via SUBCUTANEOUS
  Administered 2023-06-29: 3 [IU] via SUBCUTANEOUS

## 2023-06-28 MED ORDER — ZOLPIDEM TARTRATE 5 MG PO TABS
5.0000 mg | ORAL_TABLET | Freq: Every evening | ORAL | Status: DC | PRN
  Administered 2023-06-28: 5 mg via ORAL
  Filled 2023-06-28: qty 1

## 2023-06-28 MED ORDER — MILK AND MOLASSES ENEMA
1.0000 | Freq: Once | RECTAL | Status: AC
  Administered 2023-06-28: 240 mL via RECTAL

## 2023-06-28 MED ORDER — SIMVASTATIN 20 MG PO TABS
20.0000 mg | ORAL_TABLET | Freq: Every evening | ORAL | Status: DC
  Administered 2023-06-28: 20 mg via ORAL
  Filled 2023-06-28: qty 1

## 2023-06-28 MED ORDER — TROSPIUM CHLORIDE ER 60 MG PO CP24: 1.0000 | ORAL_CAPSULE | Freq: Every day | ORAL | Status: DC

## 2023-06-28 MED ORDER — ONDANSETRON HCL 4 MG/2ML IJ SOLN: 4.0000 mg | Freq: Four times a day (QID) | INTRAMUSCULAR | Status: DC | PRN

## 2023-06-28 MED ORDER — IOHEXOL 300 MG/ML  SOLN
100.0000 mL | Freq: Once | INTRAMUSCULAR | Status: AC | PRN
  Administered 2023-06-28: 100 mL via INTRAVENOUS

## 2023-06-28 MED ORDER — METOPROLOL SUCCINATE ER 50 MG PO TB24
50.0000 mg | ORAL_TABLET | Freq: Every day | ORAL | Status: DC
  Administered 2023-06-28: 50 mg via ORAL
  Filled 2023-06-28: qty 1

## 2023-06-28 MED ORDER — ACETAMINOPHEN 325 MG PO TABS: 650.0000 mg | ORAL_TABLET | Freq: Four times a day (QID) | ORAL | Status: DC | PRN

## 2023-06-28 MED ORDER — INSULIN ASPART 100 UNIT/ML IJ SOLN
2.0000 [IU] | Freq: Three times a day (TID) | INTRAMUSCULAR | Status: DC
  Administered 2023-06-29: 2 [IU] via SUBCUTANEOUS

## 2023-06-28 NOTE — ED Notes (Signed)
Report given to IP RN MD at bedside for disimpaction  Pt will be brought upstairs once completed and cleaned.

## 2023-06-28 NOTE — Progress Notes (Signed)
Patient unable to void bladder scan showed 506 in urinary bladder  600cc urine yellow colored to catheter collection bag

## 2023-06-28 NOTE — ED Triage Notes (Signed)
Pt arrived [facility] for c/o lower abd pain and diarrhea that started yesterday. No n/v or fever. Pt is nonambulatory.

## 2023-06-28 NOTE — Hospital Course (Addendum)
89 year old female with severe dementia, incontinent of urine and stool, diabetes mellitus type 2, hypertension, depression, chronic constipation with history of fecal impaction, coronary artery disease, hyperlipidemia, overactive bladder, COPD, failure to thrive and deconditioning presented to the emergency department by EMS complaining of lower abdominal pain and diarrhea that started yesterday.  She has had no fever chills no nausea or vomiting.  She is bedbound.  She was sent for CT scan with findings of mild colitis and moderate stool retention in the colon.  Labs have otherwise been stable.  Attempted disimpaction in ED and admission requested for bowel management regimen.  Pt is DNR.

## 2023-06-28 NOTE — ED Notes (Signed)
MD not ready for disimpaction  Went to reassess pt Pt remained laying on left side Pt was incont of stool and urine Very pungent odor Loose watery light color Will collect next sample for cdiff r/o Family at bedside

## 2023-06-28 NOTE — H&P (Signed)
History and Physical  [facility]  [name] UEA:[id number] DOB: [birth year] DOA: 06/28/2023  PCP: Assunta Found, MD  Patient coming from: Home by [facility] Level of care: Med-Surg  I have personally briefly reviewed patient's old medical records in [facility]  Chief Complaint: loose stool   HPI: [name] is a 89 year old female with severe dementia, incontinent of urine and stool, diabetes mellitus type 2, hypertension, depression, chronic constipation with history of fecal impaction, coronary artery disease, hyperlipidemia, overactive bladder, COPD, failure to thrive and deconditioning presented to the emergency department by EMS complaining of lower abdominal pain and diarrhea that started yesterday.  She has had no fever chills no nausea or vomiting.  She is bedbound.  She was sent for CT scan with findings of mild colitis and moderate stool retention in the colon.  Labs have otherwise been stable.  Attempted disimpaction in ED and admission requested for bowel management regimen.  Pt is DNR.      Past Medical History:  Diagnosis Date   CAD (coronary artery disease)    a. 12/2022 Inf STEMI/PCI: LM nl, LAD mild diff dzs, D1 50, D2 50., LCX 75d, OM1 60, RCA 90p (2.5x20 Synergy XD DES), 100d (PTCA-->90 residual).   Diabetes mellitus with neuropathy    Diabetic neuropathy (HCC)    Diastolic dysfunction    a. 12/2022 Echo: EF 60-65%, no rwma, GrI DD, nl RV size/fxn, triv MR.   Hypertension    Stroke [facility])    Urinary incontinence     Past Surgical History:  Procedure Laterality Date   ABDOMINAL HYSTERECTOMY     APPENDECTOMY     BREAST SURGERY     begnin tumor removed   CATARACT EXTRACTION, BILATERAL     CORONARY ANGIOGRAPHY N/A 12/31/2022   Procedure: CORONARY ANGIOGRAPHY;  Surgeon: Yvonne Kendall, MD;  Location: [facility] INVASIVE CV LAB;  Service: Cardiovascular;  Laterality: N/A;   CORONARY/GRAFT ACUTE MI REVASCULARIZATION N/A 12/31/2022   Procedure:  Coronary/Graft Acute MI Revascularization;  Surgeon: Yvonne Kendall, MD;  Location: [facility] INVASIVE CV LAB;  Service: Cardiovascular;  Laterality: N/A;     reports that she has never smoked. She has never used smokeless tobacco. She reports that she does not drink alcohol and does not use drugs.  Allergies  Allergen Reactions   Codeine Nausea And Vomiting   Glipizide     Hypoglycemia with glucoses recorded in the 20s necessitating glucagon and D10 IV   Sulfa Antibiotics Other (See Comments)    Unspecified     Family History  Problem Relation Age of Onset   CAD Mother    Hypertension Mother    Stroke Father    Heart attack Brother    Cancer Brother     Prior to Admission medications   Medication Sig Start Date End Date Taking? Authorizing Provider  acetaminophen (TYLENOL) 500 MG tablet Take 500 mg by mouth every 6 (six) hours as needed for mild pain.   Yes [provider]  aspirin EC 81 MG tablet Take 1 tablet (81 mg total) by mouth daily. Swallow whole. 01/05/23  Yes Dunn, Tacey Ruiz, PA-C  Cholecalciferol (VITAMIN D3) 5000 units CAPS Take 1 capsule (5,000 Units total) by mouth daily. 04/15/17  Yes Nida, Denman [name], MD  diclofenac Sodium (VOLTAREN ARTHRITIS PAIN) 1 % GEL Apply 2 g topically 3 (three) times daily. bilateral knees for osteoarthritis 07/25/22  Yes Sharee Holster, NP  gabapentin (NEURONTIN) 300 MG capsule Take 1 capsule (300 mg  total) by mouth 2 (two) times daily. Take 300 mg three times daily 05/05/23  Yes Vassie Loll, MD  metFORMIN (GLUCOPHAGE) 500 MG tablet Take 500 mg by mouth daily. 06/26/23  Yes [provider]  metoprolol succinate (TOPROL-XL) 50 MG 24 hr tablet Take 50 mg by mouth at bedtime. 04/01/23  Yes [provider]  sertraline (ZOLOFT) 25 MG tablet Take 25 mg by mouth daily. Take with 50mg  dose to equal 75mg  daily.   Yes [provider]  sertraline (ZOLOFT) 50 MG tablet Take 50 mg by mouth daily. Take with 25mg  dose to equal  75mg  daily.   Yes [provider]  silodosin (RAPAFLO) 8 MG CAPS capsule Take 1 capsule (8 mg total) by mouth daily with breakfast. 10/10/22  Yes Summerlin, Regan Rakers, PA-C  simvastatin (ZOCOR) 20 MG tablet Take 20 mg by mouth every evening. 06/24/23  Yes [provider]  ticagrelor (BRILINTA) 90 MG TABS tablet Take 1 tablet (90 mg total) by mouth 2 (two) times daily. 01/04/23  Yes Dunn, Dayna N, PA-C  Trospium Chloride 60 MG CP24 Take 1 capsule by mouth daily. 04/01/23  Yes [provider]  zolpidem (AMBIEN) 10 MG tablet Take 10 mg by mouth at bedtime as needed for sleep. 01/22/23  Yes [provider]  blood glucose meter kit and supplies KIT Dispense based on patient and insurance preference. Use to TEST BLOOD GLUCOSE THREE times daily as directed. DX E11.65 02/10/21   Roma Kayser, MD  Continuous Glucose Receiver (DEXCOM G6 RECEIVER) DEVI CHECK GLUCOSE AS NEEDED 06/24/23   [provider]  Continuous Glucose Sensor (DEXCOM G6 SENSOR) MISC SMARTSIG:1 Topical Every 10 Days 06/24/23   [provider]  Continuous Glucose Transmitter (DEXCOM G6 TRANSMITTER) MISC USE TO CHECK BLOOD SUGAR 06/24/23   [provider]  Insulin Pen Needle (BD AUTOSHIELD DUO) 30G X 5 MM MISC by Does not apply route as directed. 30 gauge X 3/16    [provider]  nitroGLYCERIN (NITROSTAT) 0.4 MG SL tablet Place 1 tablet (0.4 mg total) under the tongue every 5 (five) minutes as needed for chest pain (up to 3 doses. If taking 3rd dose, call 911). Patient not taking: Reported on 06/28/2023 01/04/23   Laurann Montana, PA-C  rosuvastatin (CRESTOR) 20 MG tablet Take 1 tablet (20 mg total) by mouth daily. Patient not taking: Reported on 06/28/2023 01/05/23   Laurann Montana, PA-C    Physical Exam: Vitals:   06/28/23 1330 06/28/23 1345 06/28/23 1415 06/28/23 1419  BP: (!) 156/62 108/76 100/74 (!) 158/61  Pulse: (!) 58 (!) 58 (!) 57 (!) 56  Resp:  16  16  Temp:     98.4 F (36.9 C)  TempSrc:    Oral  SpO2: 100% 100% 100% 100%  Weight:      Height:        Constitutional: confused, elderly female, pleasant, cooperative, NAD, calm, comfortable Eyes: PERRL, lids and conjunctivae normal ENMT: Mucous membranes are moist. Posterior pharynx clear of any exudate or lesions.  Neck: normal, supple, no masses, no thyromegaly Respiratory: clear to auscultation bilaterally, no wheezing, no crackles. Normal respiratory effort. No accessory muscle use.  Cardiovascular: normal s1, s2 sounds, no murmurs / rubs / gallops. No extremity edema. 2+ pedal pulses. No carotid bruits.  Abdomen: no tenderness, no masses palpated. No hepatosplenomegaly. Bowel sounds positive.  Musculoskeletal: no clubbing / cyanosis. No joint deformity upper and lower extremities. Good ROM, no contractures. Normal muscle tone.  Skin: chronic age related changes seen, no rashes, lesions, ulcers. No induration Neurologic: CN 2-12 grossly intact. Sensation intact, DTR normal. Strength 5/5 in all 4.  Psychiatric: Poor judgment and insight. Alert and oriented x 1. Normal mood.    Labs on Admission: I have personally reviewed following labs and imaging studies  CBC: Recent Labs  Lab 06/28/23 0843  WBC 7.3  HGB 12.7  HCT 40.0  MCV 87.5  PLT 198   Basic Metabolic Panel: Recent Labs  Lab 06/28/23 0843  NA 137  K 3.7  CL 104  CO2 25  GLUCOSE 153*  BUN 18  CREATININE 0.78  CALCIUM 9.1   GFR: Estimated Creatinine Clearance: 44.5 mL/min (by C-G formula based on SCr of 0.78 mg/dL). Liver Function Tests: Recent Labs  Lab 06/28/23 0843  AST 18  ALT 15  ALKPHOS 98  BILITOT 0.4  PROT 6.3*  ALBUMIN 3.2*   Recent Labs  Lab 06/28/23 0843  LIPASE 30   No results for input(s): "AMMONIA" in the last 168 hours. Coagulation Profile: No results for input(s): "INR", "PROTIME" in the last 168 hours. Cardiac Enzymes: No results for input(s): "CKTOTAL", "CKMB", "CKMBINDEX", "TROPONINI"  in the last 168 hours. BNP (last 3 results) No results for input(s): "PROBNP" in the last 8760 hours. HbA1C: No results for input(s): "HGBA1C" in the last 72 hours. CBG: No results for input(s): "GLUCAP" in the last 168 hours. Lipid Profile: No results for input(s): "CHOL", "HDL", "LDLCALC", "TRIG", "CHOLHDL", "LDLDIRECT" in the last 72 hours. Thyroid Function Tests: No results for input(s): "TSH", "T4TOTAL", "FREET4", "T3FREE", "THYROIDAB" in the last 72 hours. Anemia Panel: No results for input(s): "VITAMINB12", "FOLATE", "FERRITIN", "TIBC", "IRON", "RETICCTPCT" in the last 72 hours. Urine analysis:    Component Value Date/Time   COLORURINE YELLOW 06/28/2023 1200   APPEARANCEUR CLEAR 06/28/2023 1200   APPEARANCEUR Clear 10/25/2021 1450   LABSPEC 1.023 06/28/2023 1200   PHURINE 7.0 06/28/2023 1200   GLUCOSEU NEGATIVE 06/28/2023 1200   HGBUR NEGATIVE 06/28/2023 1200   BILIRUBINUR NEGATIVE 06/28/2023 1200   BILIRUBINUR Negative 10/25/2021 1450   KETONESUR NEGATIVE 06/28/2023 1200   PROTEINUR NEGATIVE 06/28/2023 1200   UROBILINOGEN 0.2 09/06/2009 0135   NITRITE NEGATIVE 06/28/2023 1200   LEUKOCYTESUR NEGATIVE 06/28/2023 1200    Radiological Exams on Admission: CT ABDOMEN PELVIS W CONTRAST  Result Date: 06/28/2023 CLINICAL DATA:  Lower abdominal pain, diarrhea. EXAM: CT ABDOMEN AND PELVIS WITH CONTRAST TECHNIQUE: Multidetector CT imaging of the abdomen and pelvis was performed using the standard protocol following bolus administration of intravenous contrast. RADIATION DOSE REDUCTION: This exam was performed according to the departmental dose-optimization program which includes automated exposure control, adjustment of the mA and/or kV according to patient size and/or use of iterative reconstruction technique. CONTRAST:  OMNIPAQUE IOHEXOL 300 MG/ML  SOLN COMPARISON:  05/01/2023 FINDINGS: Lower chest: No acute abnormality. Hepatobiliary: No focal liver lesions. No gallstones,  gallbladder wall thickening or inflammation. No significant biliary ductal dilatation. Pancreas: Unremarkable. No pancreatic ductal dilatation or surrounding inflammatory changes. Spleen: Normal in size without focal abnormality. Adrenals/Urinary Tract: Normal right adrenal gland. Left adrenal nodule measures 1.1 cm. This is been present since 09/06/2009. Findings compatible with a benign adenoma. No follow-up imaging advised. No nephrolithiasis or hydronephrosis. Urinary bladder appears within normal limits. Stomach/Bowel: The stomach appears nondistended. Mild wall thickening involving the distal esophagus and stomach noted. The appendix is not visualized and may be surgically absent. No pathologic dilatation of the large or small bowel loops to suggest  obstruction. Terminal ileum is visualized and is normal without wall thickening or inflammation. Mild diffuse mucosal enhancement and equivocal wall thickening involving the colon, which is most notable at the level of the sigmoid colon. No significant surrounding inflammatory fat stranding. Moderate retained stool identified within the rectum. Vascular/Lymphatic: Aortic atherosclerosis. No aneurysm. No signs of abdominopelvic adenopathy. Reproductive: Status post hysterectomy. Again seen is a triangular-shaped fluid density structure along the broad ligament which measures 4.1 x 3.7 cm, image 65/2. This is unchanged from the previous exam and is favored to represent a benign process. Other: No significant free fluid within the abdomen or pelvis. No signs of pneumoperitoneum. Musculoskeletal: No acute or suspicious osseous findings. Lumbar spondylosis. Status post right lateral plate and screw fixation with interbody fusion at L4-5. Marked degenerative disc disease noted at L2-3, L3-4 and L5-S1. IMPRESSION: 1. Mild diffuse mucosal enhancement and equivocal wall thickening involving the colon, most notable at the level of the sigmoid colon. Findings may reflect a  mild colitis. 2. Mild wall thickening involving the distal esophagus and stomach, which may reflect esophagitis/gastritis. Clinical correlation advised. 3. Moderate retained stool within the rectum. 4. Stable fluid density, triangular-shaped structure along the broad ligament of the left hemipelvis status post hysterectomy. This is favored to represent a benign process. 5. Aortic Atherosclerosis (ICD10-I70.0). Electronically Signed   By: Signa Kell M.D.   On: 06/28/2023 10:32     Assessment/Plan Principal Problem:   Impaction of colon (HCC) Active Problems:   Controlled type 2 diabetes with neuropathy (HCC)   Essential hypertension   Hyperlipidemia   Major neurocognitive disorder (HCC)   Poorly controlled type 2 diabetes mellitus with neuropathy (HCC)   DNR (do not resuscitate)   Fecal Impaction  - Pt has a large fecal impaction in rectum - ED provider attempted disimpaction in ED - plan for further laxatives and bowel regimen - place in observation  - scheduled miralax BID, senna at bedtime - Molasses enema x 1  - liquid stool tested for c diff given suspicious odor  Type 2 DM uncontrolled as evidenced by an A1c of 8.5%  - continue SSI coverage as ordered - continue CBG monitoring as ordered   DNR present on admission  - continue DNR order in hospital   Dementia - resume home meds - delirium precautions ordered - plan to return to home situation as soon as possible   Hyperlipidemia - resume home simvastatin   Essential hypertension  - resume home metoprolol XL 50 mg daily   DVT prophylaxis: heparin   Code Status: DNR   Family Communication: daughter at bedside   Disposition Plan: return home tomorrow if stable    Consults called:   Admission status: OB  Level of care: Med-Surg Standley Dakins MD [facility] How to contact the [facility] Attending or Consulting provider 7A - 7P or covering provider during after hours 7P -7A, for this patient?  Check the care  team in [facility] and look for a) attending/consulting [facility] provider listed and b) the [facility] team listed Log into [email or web address] and use [facility]'s universal password to access. If you do not have the password, please contact the hospital operator. Locate the [facility] provider you are looking for under [facility] and page to a number that you can be directly reached. If you still have difficulty reaching the provider, please page the [facility] (Director on Call) for the Hospitalists listed on amion for assistance.   If 7PM-7AM, please contact night-coverage [email or web address] Password [email or web address]  06/28/2023, 2:25 PM

## 2023-06-28 NOTE — ED Provider Notes (Signed)
[place] EMERGENCY DEPARTMENT AT [facility] Provider Note   CSN: [id number] Arrival date & time: 06/28/23  0807     History {Add pertinent medical, surgical, social history, OB history to HPI:1} Chief Complaint  Patient presents with   Abdominal Pain    [name] is a 89 y.o. female.  89 year old female with a history of hypertension, dementia, diabetes, fecal impaction, and UTI who presents emergency department with lower abdominal pain and diarrhea.  History obtained per the patient which is somewhat limited due to her dementia.  Says that she had 2 loose stools that were nonbloody nonmelanotic yesterday and has been having some lower abdominal pain to the suprapubic since then.  Unsure of any dysuria or frequency.  No fevers.  Her and her daughter are unsure if she has had any abdominal surgeries.  Patient's daughter says that her health aides at home have not reported any other concerning symptoms.       Home Medications Prior to Admission medications   Medication Sig Start Date End Date Taking? Authorizing Provider  acetaminophen (TYLENOL) 500 MG tablet Take 500 mg by mouth every 6 (six) hours as needed for mild pain.    [provider]  aspirin EC 81 MG tablet Take 1 tablet (81 mg total) by mouth daily. Swallow whole. 01/05/23   Dunn, Tacey Ruiz, PA-C  blood glucose meter kit and supplies KIT Dispense based on patient and insurance preference. Use to TEST BLOOD GLUCOSE THREE times daily as directed. DX E11.65 02/10/21   Roma Kayser, MD  Cholecalciferol (VITAMIN D3) 5000 units CAPS Take 1 capsule (5,000 Units total) by mouth daily. 04/15/17   Roma Kayser, MD  diclofenac Sodium (VOLTAREN ARTHRITIS PAIN) 1 % GEL Apply 2 g topically 3 (three) times daily. bilateral knees for osteoarthritis 07/25/22   Sharee Holster, NP  gabapentin (NEURONTIN) 300 MG capsule Take 1 capsule (300 mg total) by mouth 2 (two) times daily. Take 300 mg three times  daily 05/05/23   Vassie Loll, MD  Insulin Pen Needle (BD AUTOSHIELD DUO) 30G X 5 MM MISC by Does not apply route as directed. 30 gauge X 3/16    [provider]  metoprolol succinate (TOPROL-XL) 50 MG 24 hr tablet Take 50 mg by mouth at bedtime. 04/01/23   [provider]  nitroGLYCERIN (NITROSTAT) 0.4 MG SL tablet Place 1 tablet (0.4 mg total) under the tongue every 5 (five) minutes as needed for chest pain (up to 3 doses. If taking 3rd dose, call 911). 01/04/23   Dunn, Tacey Ruiz, PA-C  rosuvastatin (CRESTOR) 20 MG tablet Take 1 tablet (20 mg total) by mouth daily. 01/05/23   Dunn, Tacey Ruiz, PA-C  sertraline (ZOLOFT) 25 MG tablet Take 25 mg by mouth daily. Take with 50mg  dose to equal 75mg  daily.    [provider]  sertraline (ZOLOFT) 50 MG tablet Take 50 mg by mouth daily. Take with 25mg  dose to equal 75mg  daily.    [provider]  silodosin (RAPAFLO) 8 MG CAPS capsule Take 1 capsule (8 mg total) by mouth daily with breakfast. 10/10/22   Summerlin, Regan Rakers, PA-C  ticagrelor (BRILINTA) 90 MG TABS tablet Take 1 tablet (90 mg total) by mouth 2 (two) times daily. 01/04/23   Dunn, Tacey Ruiz, PA-C  Trospium Chloride 60 MG CP24 Take 1 capsule by mouth daily. 04/01/23   [provider]  zolpidem (AMBIEN) 10 MG tablet Take 10 mg by mouth at bedtime  as needed for sleep. 01/22/23   [provider]      Allergies    Codeine, Glipizide, and Sulfa antibiotics    Review of Systems   Review of Systems  Physical Exam Updated Vital Signs Pulse 64   Ht 5\' 7"  (1.702 m)   Wt 72.6 kg   SpO2 92%   BMI 25.06 kg/m  Physical Exam Vitals and nursing note reviewed.  Constitutional:      General: She is not in acute distress.    Appearance: She is well-developed.  HENT:     Head: Normocephalic and atraumatic.     Right Ear: External ear normal.     Left Ear: External ear normal.     Nose: Nose normal.  Eyes:     Extraocular Movements: Extraocular  movements intact.     Conjunctiva/sclera: Conjunctivae normal.     Pupils: Pupils are equal, round, and reactive to light.  Pulmonary:     Effort: Pulmonary effort is normal. No respiratory distress.  Abdominal:     General: Abdomen is flat. There is no distension.     Palpations: Abdomen is soft.     Tenderness: There is abdominal tenderness (Lower abdomen). There is no guarding.     Comments: Surgical scars to lower abdomen.  Suprapubic and right lower quadrant masses palpated.  Very difficult to tell if this is surgical scars or if they may be an underlying mass.  Musculoskeletal:     Cervical back: Normal range of motion and neck supple.     Right lower leg: No edema.     Left lower leg: No edema.  Skin:    General: Skin is warm and dry.  Neurological:     Mental Status: She is alert and oriented to person, place, and time. Mental status is at baseline.  Psychiatric:        Mood and Affect: Mood normal.     ED Results / Procedures / Treatments   Labs (all labs ordered are listed, but only abnormal results are displayed) Labs Reviewed  LIPASE, BLOOD  COMPREHENSIVE METABOLIC PANEL  CBC  URINALYSIS, ROUTINE W REFLEX MICROSCOPIC    EKG None  Radiology No results found.  Procedures Procedures  {Document cardiac monitor, telemetry assessment procedure when appropriate:1}  Medications Ordered in ED Medications - No data to display  ED Course/ Medical Decision Making/ A&P   {   Click here for ABCD2, HEART and other calculatorsREFRESH Note before signing :1}                          Medical Decision Making Amount and/or Complexity of Data Reviewed Labs: ordered. Radiology: ordered.  Risk Prescription drug management.   ***  {Document critical care time when appropriate:1} {Document review of labs and clinical decision tools ie heart score, Chads2Vasc2 etc:1}  {Document your independent review of radiology images, and any outside records:1} {Document your  discussion with family members, caretakers, and with consultants:1} {Document social determinants of health affecting pt's care:1} {Document your decision making why or why not admission, treatments were needed:1} Final Clinical Impression(s) / ED Diagnoses Final diagnoses:  None    Rx / DC Orders ED Discharge Orders     None

## 2023-06-28 NOTE — ED Notes (Signed)
In and out cath completed for ua Pt incont of urine and leaking stool Noted labia very swollen Pt has hemorrhoid and bleeding open diaper rash sores   Informed MD  Pt ready for disimpaction

## 2023-06-28 NOTE — ED Notes (Signed)
Introduced self to pt Pt stated that she is feeling better Vitals assessed  Pt waiting for surgery consult

## 2023-06-28 NOTE — Progress Notes (Signed)
Assessed patient's sacrum and perineal area. Pink tinged skin with pinpoint redness through the area, no skin slothing, blanches. Vaginal area very swollen; tender. Desitin cream applied after washing area.

## 2023-06-29 DIAGNOSIS — Z66 Do not resuscitate: Secondary | ICD-10-CM | POA: Diagnosis not present

## 2023-06-29 DIAGNOSIS — K5649 Other impaction of intestine: Secondary | ICD-10-CM | POA: Diagnosis not present

## 2023-06-29 DIAGNOSIS — F0390 Unspecified dementia without behavioral disturbance: Secondary | ICD-10-CM | POA: Diagnosis not present

## 2023-06-29 DIAGNOSIS — E782 Mixed hyperlipidemia: Secondary | ICD-10-CM | POA: Diagnosis not present

## 2023-06-29 DIAGNOSIS — I10 Essential (primary) hypertension: Secondary | ICD-10-CM | POA: Diagnosis not present

## 2023-06-29 LAB — GLUCOSE, CAPILLARY
Glucose-Capillary: 124 mg/dL — ABNORMAL HIGH (ref 70–99)
Glucose-Capillary: 154 mg/dL — ABNORMAL HIGH (ref 70–99)

## 2023-06-29 MED ORDER — POLYETHYLENE GLYCOL 3350 17 G PO PACK: 17.0000 g | PACK | Freq: Every day | ORAL | 3 refills | Status: DC

## 2023-06-29 NOTE — Progress Notes (Signed)
PT Cancellation Note  Patient Details Name: [name] MRN: [id number] DOB: 1929-11-24   Cancelled Treatment:    Reason Eval/Treat Not Completed: PT screened, no needs identified, will sign off.  Patient at baseline, total assist for transfers using sliding board at home.  Recommend nursing staff use mechanical lift to get patient out of bed during hospital stay.   10:30 AM, 06/29/23 Ocie Bob, MPT Physical Therapist with [facility] [phone] office [phone] mobile phone

## 2023-06-29 NOTE — Discharge Summary (Signed)
Physician Discharge Summary  [name] ZOX:[id number] DOB: Sep 15, 1929 DOA: 06/28/2023  PCP: Assunta Found, MD  Admit date: 06/28/2023 Discharge date: 06/29/2023  Admitted From: Home  Disposition: Home with HH  Recommendations for Outpatient Follow-up:  Follow up with PCP in 1 weeks   Home Health: PT  Discharge Condition: STABLE   CODE STATUS: DNR DIET: regular    Brief Hospitalization Summary: Please see all hospital notes, images, labs for full details of the hospitalization. Admission HPI: 89 year old female with severe dementia, incontinent of urine and stool, diabetes mellitus type 2, hypertension, depression, chronic constipation with history of fecal impaction, coronary artery disease, hyperlipidemia, overactive bladder, COPD, failure to thrive and deconditioning presented to the emergency department by EMS complaining of lower abdominal pain and diarrhea that started yesterday.  She has had no fever chills no nausea or vomiting.  She is bedbound.  She was sent for CT scan with findings of mild colitis and moderate stool retention in the colon.  Labs have otherwise been stable.  Attempted disimpaction in ED and admission requested for bowel management regimen.  Pt is DNR.    Hospital Course  Pt was admitted for observation for fecal impaction requiring manual disimpaction done in ED and also molasses enema given and she was started on miralax twice daily with good results.  Pt is feeling much better and eager to go home.  Pt will discharge home with [facility] and will be advised to continue on a daily bowel regimen of miralax daily.  Can increase it to twice daily if no BM in 3 days.    Discharge Diagnoses:  Principal Problem:   Impaction of colon (HCC) Active Problems:   Controlled type 2 diabetes with neuropathy (HCC)   Essential hypertension   Hyperlipidemia   Major neurocognitive disorder (HCC)   Poorly controlled type 2 diabetes mellitus with neuropathy (HCC)   DNR (do not  resuscitate)   Discharge Instructions:  Allergies as of 06/29/2023       Reactions   Codeine Nausea And Vomiting   Glipizide    Hypoglycemia with glucoses recorded in the 20s necessitating glucagon and D10 IV   Sulfa Antibiotics Other (See Comments)   Unspecified         Medication List     STOP taking these medications    nitroGLYCERIN 0.4 MG SL tablet Commonly known as: NITROSTAT   rosuvastatin 20 MG tablet Commonly known as: CRESTOR       TAKE these medications    acetaminophen 500 MG tablet Commonly known as: TYLENOL Take 500 mg by mouth every 6 (six) hours as needed for mild pain.   aspirin EC 81 MG tablet Take 1 tablet (81 mg total) by mouth daily. Swallow whole.   BD AutoShield Duo 30G X 5 MM Misc Generic drug: Insulin Pen Needle by Does not apply route as directed. 30 gauge X 3/16   blood glucose meter kit and supplies Kit Dispense based on patient and insurance preference. Use to TEST BLOOD GLUCOSE THREE times daily as directed. DX E11.65   Dexcom G6 Receiver Devi CHECK GLUCOSE AS NEEDED   Dexcom G6 Sensor Misc SMARTSIG:1 Topical Every 10 Days   Dexcom G6 Transmitter Misc USE TO CHECK BLOOD SUGAR   diclofenac Sodium 1 % Gel Commonly known as: Voltaren Arthritis Pain Apply 2 g topically 3 (three) times daily. bilateral knees for osteoarthritis   gabapentin 300 MG capsule Commonly known as: NEURONTIN Take 1 capsule (300 mg total) by mouth 2 (  two) times daily. Take 300 mg three times daily   metFORMIN 500 MG tablet Commonly known as: GLUCOPHAGE Take 500 mg by mouth daily.   metoprolol succinate 50 MG 24 hr tablet Commonly known as: TOPROL-XL Take 50 mg by mouth at bedtime.   polyethylene glycol 17 g packet Commonly known as: MIRALAX / GLYCOLAX Take 17 g by mouth daily.   silodosin 8 MG Caps capsule Commonly known as: RAPAFLO Take 1 capsule (8 mg total) by mouth daily with breakfast.   simvastatin 20 MG tablet Commonly known as:  ZOCOR Take 20 mg by mouth every evening.   ticagrelor 90 MG Tabs tablet Commonly known as: BRILINTA Take 1 tablet (90 mg total) by mouth 2 (two) times daily.   Trospium Chloride 60 MG Cp24 Take 1 capsule by mouth daily.   Vitamin D3 125 MCG (5000 UT) Caps Take 1 capsule (5,000 Units total) by mouth daily.   Zoloft 50 MG tablet Generic drug: sertraline Take 50 mg by mouth daily. Take with 25mg  dose to equal 75mg  daily.   sertraline 25 MG tablet Commonly known as: ZOLOFT Take 25 mg by mouth daily. Take with 50mg  dose to equal 75mg  daily.   zolpidem 10 MG tablet Commonly known as: AMBIEN Take 10 mg by mouth at bedtime as needed for sleep.        Follow-up Information     Assunta Found, MD. Schedule an appointment as soon as possible for a visit in 1 week(s).   Specialty: Family Medicine Why: Hospital Follow Up Contact information: [address] [place] [address] [phone]                Allergies  Allergen Reactions   Codeine Nausea And Vomiting   Glipizide     Hypoglycemia with glucoses recorded in the 20s necessitating glucagon and D10 IV   Sulfa Antibiotics Other (See Comments)    Unspecified    Allergies as of 06/29/2023       Reactions   Codeine Nausea And Vomiting   Glipizide    Hypoglycemia with glucoses recorded in the 20s necessitating glucagon and D10 IV   Sulfa Antibiotics Other (See Comments)   Unspecified         Medication List     STOP taking these medications    nitroGLYCERIN 0.4 MG SL tablet Commonly known as: NITROSTAT   rosuvastatin 20 MG tablet Commonly known as: CRESTOR       TAKE these medications    acetaminophen 500 MG tablet Commonly known as: TYLENOL Take 500 mg by mouth every 6 (six) hours as needed for mild pain.   aspirin EC 81 MG tablet Take 1 tablet (81 mg total) by mouth daily. Swallow whole.   BD AutoShield Duo 30G X 5 MM Misc Generic drug: Insulin Pen Needle by Does not apply route as  directed. 30 gauge X 3/16   blood glucose meter kit and supplies Kit Dispense based on patient and insurance preference. Use to TEST BLOOD GLUCOSE THREE times daily as directed. DX E11.65   Dexcom G6 Receiver Devi CHECK GLUCOSE AS NEEDED   Dexcom G6 Sensor Misc SMARTSIG:1 Topical Every 10 Days   Dexcom G6 Transmitter Misc USE TO CHECK BLOOD SUGAR   diclofenac Sodium 1 % Gel Commonly known as: Voltaren Arthritis Pain Apply 2 g topically 3 (three) times daily. bilateral knees for osteoarthritis   gabapentin 300 MG capsule Commonly known as: NEURONTIN Take 1 capsule (300 mg total) by mouth 2 (two)  times daily. Take 300 mg three times daily   metFORMIN 500 MG tablet Commonly known as: GLUCOPHAGE Take 500 mg by mouth daily.   metoprolol succinate 50 MG 24 hr tablet Commonly known as: TOPROL-XL Take 50 mg by mouth at bedtime.   polyethylene glycol 17 g packet Commonly known as: MIRALAX / GLYCOLAX Take 17 g by mouth daily.   silodosin 8 MG Caps capsule Commonly known as: RAPAFLO Take 1 capsule (8 mg total) by mouth daily with breakfast.   simvastatin 20 MG tablet Commonly known as: ZOCOR Take 20 mg by mouth every evening.   ticagrelor 90 MG Tabs tablet Commonly known as: BRILINTA Take 1 tablet (90 mg total) by mouth 2 (two) times daily.   Trospium Chloride 60 MG Cp24 Take 1 capsule by mouth daily.   Vitamin D3 125 MCG (5000 UT) Caps Take 1 capsule (5,000 Units total) by mouth daily.   Zoloft 50 MG tablet Generic drug: sertraline Take 50 mg by mouth daily. Take with 25mg  dose to equal 75mg  daily.   sertraline 25 MG tablet Commonly known as: ZOLOFT Take 25 mg by mouth daily. Take with 50mg  dose to equal 75mg  daily.   zolpidem 10 MG tablet Commonly known as: AMBIEN Take 10 mg by mouth at bedtime as needed for sleep.        Procedures/Studies: CT ABDOMEN PELVIS W CONTRAST  Result Date: 06/28/2023 CLINICAL DATA:  Lower abdominal pain, diarrhea. EXAM: CT  ABDOMEN AND PELVIS WITH CONTRAST TECHNIQUE: Multidetector CT imaging of the abdomen and pelvis was performed using the standard protocol following bolus administration of intravenous contrast. RADIATION DOSE REDUCTION: This exam was performed according to the departmental dose-optimization program which includes automated exposure control, adjustment of the mA and/or kV according to patient size and/or use of iterative reconstruction technique. CONTRAST:  OMNIPAQUE IOHEXOL 300 MG/ML  SOLN COMPARISON:  05/01/2023 FINDINGS: Lower chest: No acute abnormality. Hepatobiliary: No focal liver lesions. No gallstones, gallbladder wall thickening or inflammation. No significant biliary ductal dilatation. Pancreas: Unremarkable. No pancreatic ductal dilatation or surrounding inflammatory changes. Spleen: Normal in size without focal abnormality. Adrenals/Urinary Tract: Normal right adrenal gland. Left adrenal nodule measures 1.1 cm. This is been present since 09/06/2009. Findings compatible with a benign adenoma. No follow-up imaging advised. No nephrolithiasis or hydronephrosis. Urinary bladder appears within normal limits. Stomach/Bowel: The stomach appears nondistended. Mild wall thickening involving the distal esophagus and stomach noted. The appendix is not visualized and may be surgically absent. No pathologic dilatation of the large or small bowel loops to suggest obstruction. Terminal ileum is visualized and is normal without wall thickening or inflammation. Mild diffuse mucosal enhancement and equivocal wall thickening involving the colon, which is most notable at the level of the sigmoid colon. No significant surrounding inflammatory fat stranding. Moderate retained stool identified within the rectum. Vascular/Lymphatic: Aortic atherosclerosis. No aneurysm. No signs of abdominopelvic adenopathy. Reproductive: Status post hysterectomy. Again seen is a triangular-shaped fluid density structure along the broad  ligament which measures 4.1 x 3.7 cm, image 65/2. This is unchanged from the previous exam and is favored to represent a benign process. Other: No significant free fluid within the abdomen or pelvis. No signs of pneumoperitoneum. Musculoskeletal: No acute or suspicious osseous findings. Lumbar spondylosis. Status post right lateral plate and screw fixation with interbody fusion at L4-5. Marked degenerative disc disease noted at L2-3, L3-4 and L5-S1. IMPRESSION: 1. Mild diffuse mucosal enhancement and equivocal wall thickening involving the colon, most notable at the level  of the sigmoid colon. Findings may reflect a mild colitis. 2. Mild wall thickening involving the distal esophagus and stomach, which may reflect esophagitis/gastritis. Clinical correlation advised. 3. Moderate retained stool within the rectum. 4. Stable fluid density, triangular-shaped structure along the broad ligament of the left hemipelvis status post hysterectomy. This is favored to represent a benign process. 5. Aortic Atherosclerosis (ICD10-I70.0). Electronically Signed   By: Signa Kell M.D.   On: 06/28/2023 10:32     Subjective: Pt feeling much better after disimpaction and molasses enema and eager to go home.   Discharge Exam: Vitals:   06/28/23 2029 06/29/23 0442  BP:  133/63  Pulse: (!) 59 (!) 52  Resp: 20 20  Temp: (!) 97.5 F (36.4 C) (!) 97.5 F (36.4 C)  SpO2: 100% 98%   Vitals:   06/28/23 1738 06/28/23 2020 06/28/23 2029 06/29/23 0442  BP: (!) 158/59 (!) 141/52  133/63  Pulse: (!) 54 60 (!) 59 (!) 52  Resp: 17 18 20 20   Temp: 97.9 F (36.6 C) 97.7 F (36.5 C) (!) 97.5 F (36.4 C) (!) 97.5 F (36.4 C)  TempSrc:   Oral   SpO2: 98%  100% 98%  Weight:      Height:        General: Pt is alert, awake, not in acute distress Cardiovascular: RRR, S1/S2 +, no rubs, no gallops Respiratory: CTA bilaterally, no wheezing, no rhonchi Abdominal: Soft, NT, ND, bowel sounds + Extremities: no edema, no cyanosis    The results of significant diagnostics from this hospitalization (including imaging, microbiology, ancillary and laboratory) are listed below for reference.     Microbiology: No results found for this or any previous visit (from the past 240 hour(s)).   Labs: BNP (last 3 results) Recent Labs    12/31/22 2020 05/02/23 1308 05/04/23 0407  BNP 510.0* 553.0* 163.0*   Basic Metabolic Panel: Recent Labs  Lab 06/28/23 0843  NA 137  K 3.7  CL 104  CO2 25  GLUCOSE 153*  BUN 18  CREATININE 0.78  CALCIUM 9.1   Liver Function Tests: Recent Labs  Lab 06/28/23 0843  AST 18  ALT 15  ALKPHOS 98  BILITOT 0.4  PROT 6.3*  ALBUMIN 3.2*   Recent Labs  Lab 06/28/23 0843  LIPASE 30   No results for input(s): "AMMONIA" in the last 168 hours. CBC: Recent Labs  Lab 06/28/23 0843  WBC 7.3  HGB 12.7  HCT 40.0  MCV 87.5  PLT 198   Cardiac Enzymes: No results for input(s): "CKTOTAL", "CKMB", "CKMBINDEX", "TROPONINI" in the last 168 hours. BNP: Invalid input(s): "POCBNP" CBG: Recent Labs  Lab 06/28/23 1618 06/28/23 2031 06/29/23 0716 06/29/23 1107  GLUCAP 111* 157* 124* 154*   D-Dimer No results for input(s): "DDIMER" in the last 72 hours. Hgb A1c No results for input(s): "HGBA1C" in the last 72 hours. Lipid Profile No results for input(s): "CHOL", "HDL", "LDLCALC", "TRIG", "CHOLHDL", "LDLDIRECT" in the last 72 hours. Thyroid function studies No results for input(s): "TSH", "T4TOTAL", "T3FREE", "THYROIDAB" in the last 72 hours.  Invalid input(s): "FREET3" Anemia work up No results for input(s): "VITAMINB12", "FOLATE", "FERRITIN", "TIBC", "IRON", "RETICCTPCT" in the last 72 hours. Urinalysis    Component Value Date/Time   COLORURINE YELLOW 06/28/2023 1200   APPEARANCEUR CLEAR 06/28/2023 1200   APPEARANCEUR Clear 10/25/2021 1450   LABSPEC 1.023 06/28/2023 1200   PHURINE 7.0 06/28/2023 1200   GLUCOSEU NEGATIVE 06/28/2023 1200   HGBUR NEGATIVE 06/28/2023 1200  BILIRUBINUR NEGATIVE 06/28/2023 1200   BILIRUBINUR Negative 10/25/2021 1450   KETONESUR NEGATIVE 06/28/2023 1200   PROTEINUR NEGATIVE 06/28/2023 1200   UROBILINOGEN 0.2 09/06/2009 0135   NITRITE NEGATIVE 06/28/2023 1200   LEUKOCYTESUR NEGATIVE 06/28/2023 1200   Sepsis Labs Recent Labs  Lab 06/28/23 0843  WBC 7.3   Microbiology No results found for this or any previous visit (from the past 240 hour(s)).  Time coordinating discharge:   SIGNED:  Standley Dakins, MD  [facility] 06/29/2023, 11:57 AM How to contact the [facility] Attending or Consulting provider 7A - 7P or covering provider during after hours 7P -7A, for this patient?  Check the care team in [facility] and look for a) attending/consulting [facility] provider listed and b) the [facility] team listed Log into [email or web address] and use [place]'s universal password to access. If you do not have the password, please contact the hospital operator. Locate the [facility] provider you are looking for under [facility] and page to a number that you can be directly reached. If you still have difficulty reaching the provider, please page the [facility] (Director on Call) for the Hospitalists listed on amion for assistance.

## 2023-06-29 NOTE — Discharge Instructions (Signed)

## 2023-06-29 NOTE — Progress Notes (Signed)
Discharge instructions given patient and son [name] verbalize understanding. Expressed concern for weak mobility. Pt incompliant with physical therapy services today. Family aware and still wants to proceed with discharge to home. MD made aware and informed.

## 2023-07-04 ENCOUNTER — Emergency Department (HOSPITAL_COMMUNITY): Payer: Medicare Other

## 2023-07-04 ENCOUNTER — Inpatient Hospital Stay (HOSPITAL_COMMUNITY)
Admission: EM | Admit: 2023-07-04 | Discharge: 2023-07-08 | DRG: 689 | Disposition: A | Discharge status: Home Health | Payer: Medicare Other | Attending: Internal Medicine | Admitting: Internal Medicine

## 2023-07-04 ENCOUNTER — Other Ambulatory Visit: Payer: Self-pay

## 2023-07-04 ENCOUNTER — Encounter (HOSPITAL_COMMUNITY): Payer: Self-pay | Admitting: Emergency Medicine

## 2023-07-04 DIAGNOSIS — F05 Delirium due to known physiological condition: Secondary | ICD-10-CM | POA: Diagnosis not present

## 2023-07-04 DIAGNOSIS — E1140 Type 2 diabetes mellitus with diabetic neuropathy, unspecified: Secondary | ICD-10-CM | POA: Diagnosis not present

## 2023-07-04 DIAGNOSIS — Z79899 Other long term (current) drug therapy: Secondary | ICD-10-CM

## 2023-07-04 DIAGNOSIS — E1165 Type 2 diabetes mellitus with hyperglycemia: Secondary | ICD-10-CM | POA: Diagnosis not present

## 2023-07-04 DIAGNOSIS — N3281 Overactive bladder: Secondary | ICD-10-CM | POA: Diagnosis present

## 2023-07-04 DIAGNOSIS — K449 Diaphragmatic hernia without obstruction or gangrene: Secondary | ICD-10-CM | POA: Diagnosis present

## 2023-07-04 DIAGNOSIS — F03C0 Unspecified dementia, severe, without behavioral disturbance, psychotic disturbance, mood disturbance, and anxiety: Secondary | ICD-10-CM | POA: Diagnosis not present

## 2023-07-04 DIAGNOSIS — Z8249 Family history of ischemic heart disease and other diseases of the circulatory system: Secondary | ICD-10-CM

## 2023-07-04 DIAGNOSIS — Z8673 Personal history of transient ischemic attack (TIA), and cerebral infarction without residual deficits: Secondary | ICD-10-CM

## 2023-07-04 DIAGNOSIS — E860 Dehydration: Secondary | ICD-10-CM | POA: Diagnosis not present

## 2023-07-04 DIAGNOSIS — J9600 Acute respiratory failure, unspecified whether with hypoxia or hypercapnia: Secondary | ICD-10-CM | POA: Diagnosis not present

## 2023-07-04 DIAGNOSIS — Z66 Do not resuscitate: Secondary | ICD-10-CM | POA: Diagnosis not present

## 2023-07-04 DIAGNOSIS — I700 Atherosclerosis of aorta: Secondary | ICD-10-CM | POA: Diagnosis not present

## 2023-07-04 DIAGNOSIS — K5730 Diverticulosis of large intestine without perforation or abscess without bleeding: Secondary | ICD-10-CM | POA: Diagnosis not present

## 2023-07-04 DIAGNOSIS — N390 Urinary tract infection, site not specified: Secondary | ICD-10-CM | POA: Diagnosis not present

## 2023-07-04 DIAGNOSIS — Z90710 Acquired absence of both cervix and uterus: Secondary | ICD-10-CM

## 2023-07-04 DIAGNOSIS — Z6825 Body mass index (BMI) 25.0-25.9, adult: Secondary | ICD-10-CM

## 2023-07-04 DIAGNOSIS — I2510 Atherosclerotic heart disease of native coronary artery without angina pectoris: Secondary | ICD-10-CM | POA: Diagnosis not present

## 2023-07-04 DIAGNOSIS — G9341 Metabolic encephalopathy: Secondary | ICD-10-CM | POA: Diagnosis present

## 2023-07-04 DIAGNOSIS — Z7982 Long term (current) use of aspirin: Secondary | ICD-10-CM

## 2023-07-04 DIAGNOSIS — G9340 Encephalopathy, unspecified: Secondary | ICD-10-CM | POA: Diagnosis not present

## 2023-07-04 DIAGNOSIS — I4510 Unspecified right bundle-branch block: Secondary | ICD-10-CM | POA: Diagnosis not present

## 2023-07-04 DIAGNOSIS — L03317 Cellulitis of buttock: Secondary | ICD-10-CM | POA: Diagnosis not present

## 2023-07-04 DIAGNOSIS — E441 Mild protein-calorie malnutrition: Secondary | ICD-10-CM | POA: Diagnosis not present

## 2023-07-04 DIAGNOSIS — N3000 Acute cystitis without hematuria: Secondary | ICD-10-CM | POA: Diagnosis not present

## 2023-07-04 DIAGNOSIS — E46 Unspecified protein-calorie malnutrition: Secondary | ICD-10-CM | POA: Diagnosis not present

## 2023-07-04 DIAGNOSIS — E782 Mixed hyperlipidemia: Secondary | ICD-10-CM | POA: Diagnosis present

## 2023-07-04 DIAGNOSIS — E8809 Other disorders of plasma-protein metabolism, not elsewhere classified: Secondary | ICD-10-CM | POA: Diagnosis present

## 2023-07-04 DIAGNOSIS — I25810 Atherosclerosis of coronary artery bypass graft(s) without angina pectoris: Secondary | ICD-10-CM | POA: Diagnosis present

## 2023-07-04 DIAGNOSIS — I10 Essential (primary) hypertension: Secondary | ICD-10-CM | POA: Diagnosis present

## 2023-07-04 DIAGNOSIS — I252 Old myocardial infarction: Secondary | ICD-10-CM | POA: Diagnosis not present

## 2023-07-04 DIAGNOSIS — Z888 Allergy status to other drugs, medicaments and biological substances status: Secondary | ICD-10-CM

## 2023-07-04 DIAGNOSIS — Z743 Need for continuous supervision: Secondary | ICD-10-CM | POA: Diagnosis not present

## 2023-07-04 DIAGNOSIS — R600 Localized edema: Secondary | ICD-10-CM | POA: Diagnosis not present

## 2023-07-04 DIAGNOSIS — R0689 Other abnormalities of breathing: Secondary | ICD-10-CM | POA: Diagnosis not present

## 2023-07-04 DIAGNOSIS — E1169 Type 2 diabetes mellitus with other specified complication: Secondary | ICD-10-CM | POA: Diagnosis not present

## 2023-07-04 DIAGNOSIS — J449 Chronic obstructive pulmonary disease, unspecified: Secondary | ICD-10-CM | POA: Diagnosis present

## 2023-07-04 DIAGNOSIS — D3502 Benign neoplasm of left adrenal gland: Secondary | ICD-10-CM | POA: Diagnosis present

## 2023-07-04 DIAGNOSIS — Z885 Allergy status to narcotic agent status: Secondary | ICD-10-CM

## 2023-07-04 DIAGNOSIS — Z823 Family history of stroke: Secondary | ICD-10-CM

## 2023-07-04 DIAGNOSIS — M6281 Muscle weakness (generalized): Secondary | ICD-10-CM | POA: Diagnosis not present

## 2023-07-04 DIAGNOSIS — N3001 Acute cystitis with hematuria: Secondary | ICD-10-CM | POA: Diagnosis not present

## 2023-07-04 DIAGNOSIS — R404 Transient alteration of awareness: Secondary | ICD-10-CM | POA: Diagnosis not present

## 2023-07-04 DIAGNOSIS — R6889 Other general symptoms and signs: Secondary | ICD-10-CM | POA: Diagnosis not present

## 2023-07-04 DIAGNOSIS — Z951 Presence of aortocoronary bypass graft: Secondary | ICD-10-CM

## 2023-07-04 DIAGNOSIS — E876 Hypokalemia: Secondary | ICD-10-CM | POA: Diagnosis not present

## 2023-07-04 DIAGNOSIS — Z882 Allergy status to sulfonamides status: Secondary | ICD-10-CM

## 2023-07-04 DIAGNOSIS — Z7902 Long term (current) use of antithrombotics/antiplatelets: Secondary | ICD-10-CM

## 2023-07-04 DIAGNOSIS — R1110 Vomiting, unspecified: Secondary | ICD-10-CM | POA: Diagnosis present

## 2023-07-04 DIAGNOSIS — Z7984 Long term (current) use of oral hypoglycemic drugs: Secondary | ICD-10-CM

## 2023-07-04 LAB — COMPREHENSIVE METABOLIC PANEL
ALT: 13 U/L (ref 0–44)
AST: 21 U/L (ref 15–41)
Albumin: 3.4 g/dL — ABNORMAL LOW (ref 3.5–5.0)
Alkaline Phosphatase: 94 U/L (ref 38–126)
Anion gap: 11 (ref 5–15)
BUN: 22 mg/dL (ref 8–23)
CO2: 19 mmol/L — ABNORMAL LOW (ref 22–32)
Calcium: 9.6 mg/dL (ref 8.9–10.3)
Chloride: 105 mmol/L (ref 98–111)
Creatinine, Ser: 0.97 mg/dL (ref 0.44–1.00)
GFR, Estimated: 55 mL/min — ABNORMAL LOW (ref >60)
Glucose, Bld: 148 mg/dL — ABNORMAL HIGH (ref 70–99)
Potassium: 4 mmol/L (ref 3.5–5.1)
Sodium: 135 mmol/L (ref 135–145)
Total Bilirubin: 0.6 mg/dL (ref 0.3–1.2)
Total Protein: 6.6 g/dL (ref 6.5–8.1)

## 2023-07-04 LAB — URINALYSIS, ROUTINE W REFLEX MICROSCOPIC
Bilirubin Urine: NEGATIVE
Glucose, UA: NEGATIVE mg/dL
Ketones, ur: NEGATIVE mg/dL
Nitrite: POSITIVE — AB
Protein, ur: 100 mg/dL — AB
Specific Gravity, Urine: 1.013 (ref 1.005–1.030)
WBC, UA: >50 WBC/hpf (ref 0–5)
pH: 8 (ref 5.0–8.0)

## 2023-07-04 LAB — CBC
HCT: 47.2 % — ABNORMAL HIGH (ref 36.0–46.0)
Hemoglobin: 15 g/dL (ref 12.0–15.0)
MCH: 28.1 pg (ref 26.0–34.0)
MCHC: 31.8 g/dL (ref 30.0–36.0)
MCV: 88.6 fL (ref 80.0–100.0)
Platelets: 194 10*3/uL (ref 150–400)
RBC: 5.33 MIL/uL — ABNORMAL HIGH (ref 3.87–5.11)
RDW: 14.8 % (ref 11.5–15.5)
WBC: 10 10*3/uL (ref 4.0–10.5)
nRBC: 0 % (ref 0.0–0.2)

## 2023-07-04 LAB — LIPASE, BLOOD: Lipase: 31 U/L (ref 11–51)

## 2023-07-04 LAB — LACTIC ACID, PLASMA: Lactic Acid, Venous: 1.7 mmol/L (ref 0.5–1.9)

## 2023-07-04 LAB — CBG MONITORING, ED: Glucose-Capillary: 145 mg/dL — ABNORMAL HIGH (ref 70–99)

## 2023-07-04 MED ORDER — LACTATED RINGERS IV BOLUS
1000.0000 mL | Freq: Once | INTRAVENOUS | Status: AC
  Administered 2023-07-04: 1000 mL via INTRAVENOUS

## 2023-07-04 MED ORDER — ACETAMINOPHEN 500 MG PO TABS
1000.0000 mg | ORAL_TABLET | Freq: Once | ORAL | Status: AC
  Administered 2023-07-05: 1000 mg via ORAL
  Filled 2023-07-04: qty 2

## 2023-07-04 MED ORDER — IOHEXOL 350 MG/ML SOLN
100.0000 mL | Freq: Once | INTRAVENOUS | Status: AC | PRN
  Administered 2023-07-04: 100 mL via INTRAVENOUS

## 2023-07-04 MED ORDER — IOHEXOL 300 MG/ML  SOLN: 100.0000 mL | Freq: Once | INTRAMUSCULAR | Status: DC | PRN

## 2023-07-04 MED ORDER — SODIUM CHLORIDE 0.9 % IV SOLN
1.0000 g | Freq: Once | INTRAVENOUS | Status: AC
  Administered 2023-07-05: 1 g via INTRAVENOUS
  Filled 2023-07-04: qty 10

## 2023-07-04 NOTE — ED Triage Notes (Addendum)
EMS called out for emesis x 2 days and AMS x 2 days per family. Blood glucose 157.

## 2023-07-04 NOTE — ED Provider Notes (Signed)
[place] [facility] AT [facility] Provider Note   CSN: [id number] Arrival date & time: 07/04/23  1905     History  Chief Complaint  Patient presents with   Altered Mental Status   Emesis    [name] is a 89 y.o. female with severe dementia, T2DM, HTN, HLD, COPD, OAB, major neurocognitive disorder, history of STEMI and CAD, chronic constipation w/ h/o fecal impaction, DNR who presents with AMS/emesis.   Per chart review was recently admitted from 06/28/23-06/29/23 for abdominal pain and diarrhea, mild colitis and moderate stool retention noted on CT.  Attempted disimpaction and admitted for bowel management.  Prior to that was seen in the ED on 06/03/2023 for hyperglycemia, DC'd home.  Prior to that was admitted from 05/01/2023 to 05/05/2023 in the setting of altered mental status with UTI.  History provided by daughter. Hasn't had solid stool since her admission. Had given imodium yesterday after consulting with PCP and diarrhea calmed down. Patient was otherwise feeling okay yesterday, but then this evening started to have vomiting. W/ h/o dementia but has been even more confused than baseline, has happened in the past with UTIs. No fevers, falls or trauma that daughter is aware of.   Patient complains of no pain anywhere. Denies abd pain. She is HDS upon arrival. Blood glucose 157.    Level 5 caveat AMS/dementia.   Altered Mental Status Associated symptoms: vomiting   Emesis      Home Medications Prior to Admission medications   Medication Sig Start Date End Date Taking? Authorizing Provider  aspirin EC 81 MG tablet Take 1 tablet (81 mg total) by mouth daily. Swallow whole. 01/05/23  Yes Dunn, Tacey Ruiz, PA-C  Cholecalciferol (VITAMIN D3) 5000 units CAPS Take 1 capsule (5,000 Units total) by mouth daily. 04/15/17  Yes Nida, Denman [name], MD  gabapentin (NEURONTIN) 300 MG capsule Take 1 capsule (300 mg total) by mouth 2 (two) times daily. Take 300 mg  three times daily Patient taking differently: Take 300-600 mg by mouth 2 (two) times daily. Take 600 mg (2 capsules) by mouth every morning and 300 mg (1 capsule) every evening. 05/05/23  Yes Vassie Loll, MD  metFORMIN (GLUCOPHAGE) 500 MG tablet Take 500 mg by mouth 2 (two) times daily. 06/26/23  Yes [provider]  metoprolol succinate (TOPROL-XL) 50 MG 24 hr tablet Take 50 mg by mouth at bedtime. 04/01/23  Yes [provider]  sertraline (ZOLOFT) 25 MG tablet Take 25 mg by mouth daily. Take with 50mg  dose to equal 75mg  daily.   Yes [provider]  sertraline (ZOLOFT) 50 MG tablet Take 50 mg by mouth daily. Take with 25mg  dose to equal 75mg  daily.   Yes [provider]  silodosin (RAPAFLO) 8 MG CAPS capsule Take 1 capsule (8 mg total) by mouth daily with breakfast. 10/10/22  Yes Summerlin, Regan Rakers, PA-C  simvastatin (ZOCOR) 20 MG tablet Take 20 mg by mouth every evening. 06/24/23  Yes [provider]  ticagrelor (BRILINTA) 90 MG TABS tablet Take 1 tablet (90 mg total) by mouth 2 (two) times daily. 01/04/23  Yes Dunn, Dayna N, PA-C  Trospium Chloride 60 MG CP24 Take 1 capsule by mouth daily. 04/01/23  Yes [provider]  polyethylene glycol (MIRALAX / GLYCOLAX) 17 g packet Take 17 g by mouth daily. Patient not taking: Reported on 07/05/2023 06/29/23   Cleora Fleet, MD      Allergies    Codeine, Glipizide, and Sulfa  antibiotics    Review of Systems   Review of Systems  Gastrointestinal:  Positive for vomiting.   A 10 point review of systems was performed and is negative unless otherwise reported in HPI.  Physical Exam Updated Vital Signs BP (!) 151/65 (BP Location: Left Arm)   Pulse 64   Temp 98.5 F (36.9 C)   Resp 18   Ht 5\' 7"  (1.702 m)   Wt 73 kg   SpO2 99%   BMI 25.21 kg/m  Physical Exam General: Normal appearing elderly female, lying in bed.  HEENT: PERRLA, Sclera anicteric, MMM, trachea midline.  Cardiology:  RRR, no murmurs/rubs/gallops. BL radial and DP pulses equal bilaterally.  Resp: Normal respiratory rate and effort. CTAB, no wheezes, rhonchi, crackles.  Abd: Soft, non-tender, non-distended. No rebound tenderness or guarding.  GU: Deferred. MSK: No peripheral edema or signs of trauma.  Skin: warm, dry.  Back: No CVA tenderness Neuro: A&Ox1, CNs II-XII grossly intact. MAEs. Sensation grossly intact. Normal speech.  ED Results / Procedures / Treatments   Labs (all labs ordered are listed, but only abnormal results are displayed) Labs Reviewed  COMPREHENSIVE METABOLIC PANEL - Abnormal; Notable for the following components:      Result Value   CO2 19 (*)    Glucose, Bld 148 (*)    Albumin 3.4 (*)    GFR, Estimated 55 (*)    All other components within normal limits  URINALYSIS, ROUTINE W REFLEX MICROSCOPIC - Abnormal; Notable for the following components:   APPearance CLOUDY (*)    Hgb urine dipstick SMALL (*)    Protein, ur 100 (*)    Nitrite POSITIVE (*)    Leukocytes,Ua LARGE (*)    Bacteria, UA RARE (*)    All other components within normal limits  CBC - Abnormal; Notable for the following components:   RBC 5.33 (*)    HCT 47.2 (*)    All other components within normal limits  CBC - Abnormal; Notable for the following components:   Hemoglobin 11.6 (*)    HCT 35.8 (*)    All other components within normal limits  COMPREHENSIVE METABOLIC PANEL - Abnormal; Notable for the following components:   Sodium 134 (*)    Potassium 3.2 (*)    CO2 21 (*)    Glucose, Bld 113 (*)    Calcium 8.6 (*)    Total Protein 5.4 (*)    Albumin 2.8 (*)    AST 12 (*)    All other components within normal limits  CULTURE, BLOOD (ROUTINE X 2)  CULTURE, BLOOD (ROUTINE X 2) W REFLEX TO ID PANEL  C DIFFICILE QUICK SCREEN W PCR REFLEX    GASTROINTESTINAL PANEL BY PCR, STOOL (REPLACES STOOL CULTURE)  LIPASE, BLOOD  LACTIC ACID, PLASMA  MAGNESIUM  PHOSPHORUS  VITAMIN B12  FOLATE  TSH     EKG EKG Interpretation Date/Time:  Thursday July 04 2023 19:16:39 EDT Ventricular Rate:  65 PR Interval:  197 QRS Duration:  130 QT Interval:  425 QTC Calculation: 442 R Axis:   62  Text Interpretation: Sinus rhythm Right bundle branch block Inferior infarct, old Confirmed by Gerhard Munch [phone]) on 07/05/2023 10:44:28 AM  Radiology CT ABDOMEN PELVIS WO CONTRAST  Result Date: 07/04/2023 CLINICAL DATA:  Vomiting and altered mental status. EXAM: CT ABDOMEN AND PELVIS WITHOUT CONTRAST TECHNIQUE: Multidetector CT imaging of the abdomen and pelvis was performed following the standard protocol without IV contrast. RADIATION DOSE REDUCTION: This exam was performed according  to the departmental dose-optimization program which includes automated exposure control, adjustment of the mA and/or kV according to patient size and/or use of iterative reconstruction technique. COMPARISON:  June 28, 2023 FINDINGS: Lower chest: No acute abnormality. Hepatobiliary: No focal liver abnormality is seen. No gallstones, gallbladder wall thickening, or biliary dilatation. Pancreas: Unremarkable. No pancreatic ductal dilatation or surrounding inflammatory changes. Spleen: Normal in size without focal abnormality. Adrenals/Urinary Tract: A stable 11 mm diameter low-attenuation left adrenal mass is seen (approximately 15.10 Hounsfield units). The right adrenal gland is unremarkable. Kidneys are normal in size, without renal calculi or focal lesions. Mild, stable right extrarenal pelvis prominence is noted. Bladder is unremarkable. Stomach/Bowel: There is a small hiatal hernia. The appendix is surgically absent. No evidence of bowel wall thickening, distention, or inflammatory changes. Noninflamed diverticula are seen along the junction of the descending and sigmoid colon. Vascular/Lymphatic: Aortic atherosclerosis. No enlarged abdominal or pelvic lymph nodes. Reproductive: Status post hysterectomy. The right adnexa is  unremarkable. A 3.3 cm x 3.0 cm left adnexal cyst is seen. This is decreased in size when compared to the prior study. Other: No abdominal wall hernia or abnormality. No abdominopelvic ascites. Musculoskeletal: Stable degenerative and postoperative changes are seen within the lower lumbar spine. IMPRESSION: 1. Small hiatal hernia. 2. Colonic diverticulosis. 3. Stable left adrenal adenoma. No follow-up imaging is recommended. This recommendation follows ACR consensus guidelines: Management of Incidental Adrenal Masses: A White Paper of the ACR Incidental Findings Committee. J Am Coll Radiol 2017;14:1038-1044. 4. Benign-appearing simple left adnexal cyst. No follow-up imaging is recommended. This recommendation follows ACR consensus guidelines: White Paper of the ACR Incidental Findings Committee II on Adnexal Findings. J Am Coll Radiol 2013:10:675-681. 5. Aortic atherosclerosis. Aortic Atherosclerosis (ICD10-I70.0). Electronically Signed   By: Aram Candela M.D.   On: 07/04/2023 22:19    Procedures Procedures    Medications Ordered in ED Medications    ED Course/ Medical Decision Making/ A&P                          Medical Decision Making Amount and/or Complexity of Data Reviewed Labs: ordered. Decision-making details documented in ED Course. Radiology: ordered. Decision-making details documented in ED Course.  Risk OTC drugs. Prescription drug management. Decision regarding hospitalization.    This patient presents to the ED for concern of AMS, UTI, diarrhea; this involves an extensive number of treatment options, and is a complaint that carries with it a high risk of complications and morbidity.  I considered the following differential and admission for this acute, potentially life threatening condition. HDS afebrile.  MDM:    C/f SBO or ileus with recent fecal impaction, no solid stools since that time, now with vomiting and altered mental status.  Will obtain repeat CT abdomen  pelvis.  Also consider UTI given that she has had EMS with like this in the past with UTIs.  She is hemodynamically stable, afebrile, no concern for bacteremia/sepsis.  Also consider electrolyte abnormalities, renal injury, dehydration, metabolic derangements, hypo-/hyperglycemia.  No known trauma and she is NCAT, no focal neurodeficits, has dementia at baseline, lower concern for ICH or hydrocephalus.  Clinical Course as of 07/06/23 0032  Thu Jul 04, 2023  2107 Unremarkable CBC, lipase, CMP, glucose [HN]  2230 CT ABDOMEN PELVIS WO CONTRAST 1. Small hiatal hernia. 2. Colonic diverticulosis. 3. Stable left adrenal adenoma. No follow-up imaging is recommended. This recommendation follows ACR consensus guidelines: Management of Incidental Adrenal Masses: A White Paper  of the ACR Incidental Findings Committee. J Am Coll Radiol 2017;14:1038-1044. 4. Benign-appearing simple left adnexal cyst. No follow-up imaging is recommended. This recommendation follows ACR consensus guidelines: White Paper of the ACR Incidental Findings Committee II on Adnexal Findings. J Am Coll Radiol 2013:10:675-681. 5. Aortic atherosclerosis.   [HN]  2334 Urinalysis, Routine w reflex microscopic -Urine, Clean Catch(!) +UTI. Given Cefepime IV d/t recent hospitalization. CT with no ileus/SBO.  Will consult to medicine and admit for UTI/AMS.  [HN]  2335 Febrile now. No tachycardia or leukocytosis to indicate sepsis/bacteremia but will send blood cutlures. [HN]    Clinical Course User Index [HN] Loetta Rough, MD    Labs: I Ordered, and personally interpreted labs.  The pertinent results include:  those listed above  Imaging Studies ordered: I ordered imaging studies including CT abd pelvis I independently visualized and interpreted imaging. I agree with the radiologist interpretation  Additional history obtained from chart reivew, daughter at bedside.  Cardiac Monitoring: The patient was maintained on a cardiac  monitor.  I personally viewed and interpreted the cardiac monitored which showed an underlying rhythm of: NSR  Reevaluation: After the interventions noted above, I reevaluated the patient and found that they have :stayed the same  Social Determinants of Health: Lives with husband  Disposition:  Admit to medicine for IV abx  Co morbidities that complicate the patient evaluation  Past Medical History:  Diagnosis Date   CAD (coronary artery disease)    a. 12/2022 Inf STEMI/PCI: LM nl, LAD mild diff dzs, D1 50, D2 50., LCX 75d, OM1 60, RCA 90p (2.5x20 Synergy XD DES), 100d (PTCA-->90 residual).   Diabetes mellitus with neuropathy    Diabetic neuropathy (HCC)    Diastolic dysfunction    a. 12/2022 Echo: EF 60-65%, no rwma, GrI DD, nl RV size/fxn, triv MR.   Hypertension    Stroke [facility])    Urinary incontinence      Medicines Meds ordered this encounter  Medications   lactated ringers bolus 1,000 mL   DISCONTD: iohexol (OMNIPAQUE) 300 MG/ML solution 100 mL   iohexol (OMNIPAQUE) 350 MG/ML injection 100 mL   cefTRIAXone (ROCEPHIN) 1 g in sodium chloride 0.9 % 100 mL IVPB    Order Specific Question:   Antibiotic Indication:    Answer:   UTI   acetaminophen (TYLENOL) tablet 1,000 mg   insulin aspart (novoLOG) injection 0-15 Units    Order Specific Question:   Correction coverage:    Answer:   Moderate (average weight, post-op)    Order Specific Question:   CBG < 70:    Answer:   Implement Hypoglycemia Standing Orders and refer to Hypoglycemia Standing Orders sidebar report    Order Specific Question:   CBG 70 - 120:    Answer:   0 units    Order Specific Question:   CBG 121 - 150:    Answer:   2 units    Order Specific Question:   CBG 151 - 200:    Answer:   3 units    Order Specific Question:   CBG 201 - 250:    Answer:   5 units    Order Specific Question:   CBG 251 - 300:    Answer:   8 units    Order Specific Question:   CBG 301 - 350:    Answer:   11 units    Order  Specific Question:   CBG 351 - 400:    Answer:   15  units    Order Specific Question:   CBG > 400    Answer:   call MD and obtain STAT lab verification   enoxaparin (LOVENOX) injection 40 mg   OR Linked Order Group    acetaminophen (TYLENOL) tablet 650 mg    acetaminophen (TYLENOL) suppository 650 mg   OR Linked Order Group    ondansetron (ZOFRAN) tablet 4 mg    ondansetron (ZOFRAN) injection 4 mg   DISCONTD: ceFEPIme (MAXIPIME) 1 g in sodium chloride 0.9 % 100 mL IVPB    Order Specific Question:   Antibiotic Indication:    Answer:   UTI   aspirin EC tablet 81 mg    Swallow whole.     DISCONTD: metoprolol succinate (TOPROL-XL) 24 hr tablet 50 mg   simvastatin (ZOCOR) tablet 20 mg   ticagrelor (BRILINTA) tablet 90 mg   feeding supplement (GLUCERNA SHAKE) (GLUCERNA SHAKE) liquid 237 mL   ceFEPIme (MAXIPIME) 2 g in sodium chloride 0.9 % 100 mL IVPB    Order Specific Question:   Antibiotic Indication:    Answer:   UTI   metoprolol succinate (TOPROL-XL) 24 hr tablet 25 mg   pantoprazole (PROTONIX) injection 40 mg   0.9 % NaCl with KCl 40 mEq / L  infusion   potassium chloride (KLOR-CON M) CR tablet 10 mEq    I have reviewed the patients home medicines and have made adjustments as needed  Problem List / ED Course: Problem List Items Addressed This Visit       Genitourinary   * (Principal) UTI (urinary tract infection) - Primary   Relevant Medications   ceFEPIme (MAXIPIME) 2 g in sodium chloride 0.9 % 100 mL IVPB                This note was created using dictation software, which may contain spelling or grammatical errors.    Loetta Rough, MD 07/06/23 [phone]

## 2023-07-05 DIAGNOSIS — N390 Urinary tract infection, site not specified: Secondary | ICD-10-CM | POA: Diagnosis present

## 2023-07-05 DIAGNOSIS — E1169 Type 2 diabetes mellitus with other specified complication: Secondary | ICD-10-CM | POA: Diagnosis present

## 2023-07-05 DIAGNOSIS — N3001 Acute cystitis with hematuria: Secondary | ICD-10-CM | POA: Diagnosis not present

## 2023-07-05 DIAGNOSIS — E441 Mild protein-calorie malnutrition: Secondary | ICD-10-CM | POA: Diagnosis present

## 2023-07-05 DIAGNOSIS — E1165 Type 2 diabetes mellitus with hyperglycemia: Secondary | ICD-10-CM | POA: Diagnosis present

## 2023-07-05 DIAGNOSIS — E8809 Other disorders of plasma-protein metabolism, not elsewhere classified: Secondary | ICD-10-CM | POA: Diagnosis present

## 2023-07-05 DIAGNOSIS — F05 Delirium due to known physiological condition: Secondary | ICD-10-CM | POA: Diagnosis not present

## 2023-07-05 DIAGNOSIS — I252 Old myocardial infarction: Secondary | ICD-10-CM | POA: Diagnosis not present

## 2023-07-05 DIAGNOSIS — K5730 Diverticulosis of large intestine without perforation or abscess without bleeding: Secondary | ICD-10-CM | POA: Diagnosis present

## 2023-07-05 DIAGNOSIS — N3000 Acute cystitis without hematuria: Secondary | ICD-10-CM | POA: Diagnosis not present

## 2023-07-05 DIAGNOSIS — I700 Atherosclerosis of aorta: Secondary | ICD-10-CM | POA: Diagnosis present

## 2023-07-05 DIAGNOSIS — I10 Essential (primary) hypertension: Secondary | ICD-10-CM

## 2023-07-05 DIAGNOSIS — E1140 Type 2 diabetes mellitus with diabetic neuropathy, unspecified: Secondary | ICD-10-CM | POA: Diagnosis present

## 2023-07-05 DIAGNOSIS — Z8249 Family history of ischemic heart disease and other diseases of the circulatory system: Secondary | ICD-10-CM | POA: Diagnosis not present

## 2023-07-05 DIAGNOSIS — E782 Mixed hyperlipidemia: Secondary | ICD-10-CM | POA: Diagnosis present

## 2023-07-05 DIAGNOSIS — E860 Dehydration: Secondary | ICD-10-CM | POA: Diagnosis present

## 2023-07-05 DIAGNOSIS — Z951 Presence of aortocoronary bypass graft: Secondary | ICD-10-CM | POA: Diagnosis not present

## 2023-07-05 DIAGNOSIS — Z66 Do not resuscitate: Secondary | ICD-10-CM | POA: Diagnosis present

## 2023-07-05 DIAGNOSIS — G9341 Metabolic encephalopathy: Secondary | ICD-10-CM

## 2023-07-05 DIAGNOSIS — E46 Unspecified protein-calorie malnutrition: Secondary | ICD-10-CM | POA: Insufficient documentation

## 2023-07-05 DIAGNOSIS — J449 Chronic obstructive pulmonary disease, unspecified: Secondary | ICD-10-CM | POA: Diagnosis present

## 2023-07-05 DIAGNOSIS — K449 Diaphragmatic hernia without obstruction or gangrene: Secondary | ICD-10-CM | POA: Diagnosis present

## 2023-07-05 DIAGNOSIS — F03C0 Unspecified dementia, severe, without behavioral disturbance, psychotic disturbance, mood disturbance, and anxiety: Secondary | ICD-10-CM | POA: Diagnosis present

## 2023-07-05 DIAGNOSIS — E876 Hypokalemia: Secondary | ICD-10-CM | POA: Diagnosis not present

## 2023-07-05 DIAGNOSIS — I2510 Atherosclerotic heart disease of native coronary artery without angina pectoris: Secondary | ICD-10-CM | POA: Diagnosis present

## 2023-07-05 DIAGNOSIS — D3502 Benign neoplasm of left adrenal gland: Secondary | ICD-10-CM | POA: Diagnosis present

## 2023-07-05 LAB — GLUCOSE, CAPILLARY
Glucose-Capillary: 103 mg/dL — ABNORMAL HIGH (ref 70–99)
Glucose-Capillary: 183 mg/dL — ABNORMAL HIGH (ref 70–99)
Glucose-Capillary: 227 mg/dL — ABNORMAL HIGH (ref 70–99)
Glucose-Capillary: 269 mg/dL — ABNORMAL HIGH (ref 70–99)

## 2023-07-05 LAB — PHOSPHORUS: Phosphorus: 3.3 mg/dL (ref 2.5–4.6)

## 2023-07-05 LAB — COMPREHENSIVE METABOLIC PANEL
ALT: 10 U/L (ref 0–44)
AST: 12 U/L — ABNORMAL LOW (ref 15–41)
Albumin: 2.8 g/dL — ABNORMAL LOW (ref 3.5–5.0)
Alkaline Phosphatase: 71 U/L (ref 38–126)
Anion gap: 9 (ref 5–15)
BUN: 21 mg/dL (ref 8–23)
CO2: 21 mmol/L — ABNORMAL LOW (ref 22–32)
Calcium: 8.6 mg/dL — ABNORMAL LOW (ref 8.9–10.3)
Chloride: 104 mmol/L (ref 98–111)
Creatinine, Ser: 0.8 mg/dL (ref 0.44–1.00)
GFR, Estimated: >60 mL/min (ref >60)
Glucose, Bld: 113 mg/dL — ABNORMAL HIGH (ref 70–99)
Potassium: 3.2 mmol/L — ABNORMAL LOW (ref 3.5–5.1)
Sodium: 134 mmol/L — ABNORMAL LOW (ref 135–145)
Total Bilirubin: 0.6 mg/dL (ref 0.3–1.2)
Total Protein: 5.4 g/dL — ABNORMAL LOW (ref 6.5–8.1)

## 2023-07-05 LAB — MAGNESIUM: Magnesium: 1.8 mg/dL (ref 1.7–2.4)

## 2023-07-05 LAB — PROCALCITONIN: Procalcitonin: <0.1 ng/mL

## 2023-07-05 LAB — CBC
HCT: 35.8 % — ABNORMAL LOW (ref 36.0–46.0)
Hemoglobin: 11.6 g/dL — ABNORMAL LOW (ref 12.0–15.0)
MCH: 28.6 pg (ref 26.0–34.0)
MCHC: 32.4 g/dL (ref 30.0–36.0)
MCV: 88.2 fL (ref 80.0–100.0)
Platelets: 171 10*3/uL (ref 150–400)
RBC: 4.06 MIL/uL (ref 3.87–5.11)
RDW: 14.9 % (ref 11.5–15.5)
WBC: 7.7 10*3/uL (ref 4.0–10.5)
nRBC: 0 % (ref 0.0–0.2)

## 2023-07-05 LAB — FOLATE: Folate: 8.3 ng/mL (ref >5.9)

## 2023-07-05 LAB — TSH: TSH: 3.171 u[IU]/mL (ref 0.350–4.500)

## 2023-07-05 LAB — T4, FREE: Free T4: 1.05 ng/dL (ref 0.61–1.12)

## 2023-07-05 LAB — VITAMIN B12: Vitamin B-12: 240 pg/mL (ref 180–914)

## 2023-07-05 MED ORDER — SIMVASTATIN 20 MG PO TABS
20.0000 mg | ORAL_TABLET | Freq: Every evening | ORAL | Status: DC
  Administered 2023-07-05 – 2023-07-07 (×2): 20 mg via ORAL
  Filled 2023-07-05 (×3): qty 1

## 2023-07-05 MED ORDER — METOPROLOL SUCCINATE ER 50 MG PO TB24: 50.0000 mg | ORAL_TABLET | Freq: Every day | ORAL | Status: DC

## 2023-07-05 MED ORDER — SODIUM CHLORIDE 0.9 % IV SOLN
2.0000 g | INTRAVENOUS | Status: DC
  Filled 2023-07-05 (×2): qty 12.5

## 2023-07-05 MED ORDER — ONDANSETRON HCL 4 MG/2ML IJ SOLN: 4.0000 mg | Freq: Four times a day (QID) | INTRAMUSCULAR | Status: DC | PRN

## 2023-07-05 MED ORDER — ASPIRIN 81 MG PO TBEC
81.0000 mg | DELAYED_RELEASE_TABLET | Freq: Every day | ORAL | Status: DC
  Administered 2023-07-05 – 2023-07-08 (×3): 81 mg via ORAL
  Filled 2023-07-05 (×4): qty 1

## 2023-07-05 MED ORDER — TICAGRELOR 90 MG PO TABS
90.0000 mg | ORAL_TABLET | Freq: Two times a day (BID) | ORAL | Status: DC
  Administered 2023-07-05 – 2023-07-08 (×6): 90 mg via ORAL
  Filled 2023-07-05 (×7): qty 1

## 2023-07-05 MED ORDER — GLUCERNA SHAKE PO LIQD
237.0000 mL | Freq: Three times a day (TID) | ORAL | Status: DC
  Administered 2023-07-05 – 2023-07-08 (×8): 237 mL via ORAL

## 2023-07-05 MED ORDER — POTASSIUM CHLORIDE CRYS ER 10 MEQ PO TBCR
10.0000 meq | EXTENDED_RELEASE_TABLET | Freq: Once | ORAL | Status: AC
  Administered 2023-07-05: 10 meq via ORAL
  Filled 2023-07-05: qty 1

## 2023-07-05 MED ORDER — SODIUM CHLORIDE 0.9 % IV SOLN
1.0000 g | Freq: Two times a day (BID) | INTRAVENOUS | Status: DC
  Filled 2023-07-05 (×3): qty 10

## 2023-07-05 MED ORDER — PANTOPRAZOLE SODIUM 40 MG IV SOLR
40.0000 mg | Freq: Two times a day (BID) | INTRAVENOUS | Status: DC
  Administered 2023-07-05 – 2023-07-08 (×6): 40 mg via INTRAVENOUS
  Filled 2023-07-05 (×7): qty 10

## 2023-07-05 MED ORDER — ACETAMINOPHEN 325 MG PO TABS: 650.0000 mg | ORAL_TABLET | Freq: Four times a day (QID) | ORAL | Status: DC | PRN

## 2023-07-05 MED ORDER — METOPROLOL SUCCINATE ER 25 MG PO TB24
25.0000 mg | ORAL_TABLET | Freq: Every day | ORAL | Status: DC
  Administered 2023-07-06: 25 mg via ORAL
  Filled 2023-07-05 (×2): qty 1

## 2023-07-05 MED ORDER — ACETAMINOPHEN 650 MG RE SUPP: 650.0000 mg | Freq: Four times a day (QID) | RECTAL | Status: DC | PRN

## 2023-07-05 MED ORDER — ONDANSETRON HCL 4 MG PO TABS: 4.0000 mg | ORAL_TABLET | Freq: Four times a day (QID) | ORAL | Status: DC | PRN

## 2023-07-05 MED ORDER — ENOXAPARIN SODIUM 40 MG/0.4ML IJ SOSY
40.0000 mg | PREFILLED_SYRINGE | Freq: Every day | INTRAMUSCULAR | Status: DC
  Administered 2023-07-05 – 2023-07-08 (×4): 40 mg via SUBCUTANEOUS
  Filled 2023-07-05 (×3): qty 0.4

## 2023-07-05 MED ORDER — POTASSIUM CHLORIDE IN NACL 40-0.9 MEQ/L-% IV SOLN: INTRAVENOUS | Status: AC

## 2023-07-05 MED ORDER — INSULIN ASPART 100 UNIT/ML IJ SOLN
0.0000 [IU] | Freq: Three times a day (TID) | INTRAMUSCULAR | Status: DC
  Administered 2023-07-06 – 2023-07-07 (×2): 3 [IU] via SUBCUTANEOUS
  Administered 2023-07-07: 11 [IU] via SUBCUTANEOUS
  Administered 2023-07-08: 3 [IU] via SUBCUTANEOUS

## 2023-07-05 NOTE — Plan of Care (Signed)

## 2023-07-05 NOTE — Plan of Care (Signed)

## 2023-07-05 NOTE — Hospital Course (Addendum)
89 year old female with a history of dementia, hypertension, diabetes mellitus type 2, hyperlipidemia, stroke, coronary artery disease status post STEMI 12/2022, and constipation presenting with nausea, vomiting, and altered mental status.  The patient has had numerous similar admissions in the recent past.  Notably, the patient had recent hospital admission from 06/28/2023 to 06/29/2023 at which time the patient was treated for fecal impaction.  She was manually disimpacted and discharged home with instructions to increase MiraLAX as needed if no BM in 3 days.  Another recent hospital admission from 05/01/2023 to 05/05/2023 was noted for UTI for which she was discharged home with ciprofloxacin after treatment with ceftriaxone in the hospital.  She also had a hospital admission from 02/10/2023 to 02/13/2023 secondary to hypoglycemia and a Proteus UTI. Upon further questioning, the patient is a poor historian, and she has poor insight regarding her medications. Patient's daughter states that the patient has had at least 5-6 loose stools on a daily basis since discharge from the hospital on 06/29/2023.  There is no hematochezia or melena.  There is no hematemesis.  Her appetite has been worse than usual.  There is been no coughing, hemoptysis, chest pain, shortness breath, abdominal pain noted.  The patient does require assistance with transfers.  In the ED, the patient was febrile up to 101.6 F.  She was hemodynamically stable with oxygen saturation 99% room air.  WBC 10.0, hemoglobin 15.0, platelets 171.  Sodium 135, potassium 4.0, bicarbonate 19, serum creatinine 0.97.  LFTs were unremarkable.  CT of the abdomen and pelvis showed a small hiatus hernia, diverticulosis, stable left adrenal adenoma, and a benign-appearing left adnexal cyst.  The patient was started on ceftriaxone initially and IV fluids.

## 2023-07-05 NOTE — Progress Notes (Signed)
Pharmacy Antibiotic Note  [name] is a 89 y.o. female admitted on 07/04/2023 with AMS/UTI.  Pharmacy has been consulted for Cefepime dosing.  Plan: Cefepime 1 g IV q12h  Height: 5\' 7"  (170.2 cm) Weight: 73 kg (160 lb 15 oz) IBW/kg (Calculated) : 61.6  Temp (24hrs), Avg:99.4 F (37.4 C), Min:98.2 F (36.8 C), Max:101.6 F (38.7 C)  Recent Labs  Lab 06/28/23 0843 07/04/23 2018 07/04/23 2052 07/04/23 2138  WBC 7.3  --  10.0  --   CREATININE 0.78 0.97  --   --   LATICACIDVEN  --   --   --  1.7    Estimated Creatinine Clearance: 36.7 mL/min (by C-G formula based on SCr of 0.97 mg/dL).    Allergies  Allergen Reactions   Codeine Nausea And Vomiting   Glipizide     Hypoglycemia with glucoses recorded in the 20s necessitating glucagon and D10 IV   Sulfa Antibiotics Other (See Comments)    Unspecified      Eddie Candle 07/05/2023 5:08 AM

## 2023-07-05 NOTE — Progress Notes (Signed)
Patient declined insulin for glucose control with her dinner. Educated patient and husband at bedside; however, still declined stating "she would handle it tomorrow".

## 2023-07-05 NOTE — Progress Notes (Signed)
PROGRESS NOTE  [name] NWG:[id number] DOB: January 22, 1929 DOA: 07/04/2023 PCP: Assunta Found, MD  Brief History:  89 year old female with a history of dementia, hypertension, diabetes mellitus type 2, hyperlipidemia, stroke, coronary artery disease status post STEMI 12/2022, and constipation presenting with nausea, vomiting, and altered mental status.  The patient has had numerous similar admissions in the recent past.  Notably, the patient had recent hospital admission from 06/28/2023 to 06/29/2023 at which time the patient was treated for fecal impaction.  She was manually disimpacted and discharged home with instructions to increase MiraLAX as needed if no BM in 3 days.  Another recent hospital admission from 05/01/2023 to 05/05/2023 was noted for UTI for which she was discharged home with ciprofloxacin after treatment with ceftriaxone in the hospital.  She also had a hospital admission from 02/10/2023 to 02/13/2023 secondary to hypoglycemia and a Proteus UTI. Upon further questioning, the patient is a poor historian, and she has poor insight regarding her medications. Patient's daughter states that the patient has had at least 5-6 loose stools on a daily basis since discharge from the hospital on 06/29/2023.  There is no hematochezia or melena.  There is no hematemesis.  Her appetite has been worse than usual.  There is been no coughing, hemoptysis, chest pain, shortness breath, abdominal pain noted.  The patient does require assistance with transfers.  In the ED, the patient was febrile up to 101.6 F.  She was hemodynamically stable with oxygen saturation 99% room air.  WBC 10.0, hemoglobin 15.0, platelets 171.  Sodium 135, potassium 4.0, bicarbonate 19, serum creatinine 0.97.  LFTs were unremarkable.  CT of the abdomen and pelvis showed a small hiatus hernia, diverticulosis, stable left adrenal adenoma, and a benign-appearing left adnexal cyst.  The patient was started on ceftriaxone initially  and IV fluids.   Assessment/Plan: Acute metabolic encephalopathy -Secondary to UTI and dehydration -Continue IV fluids  -Continue empiric cefepime -B12 -TSH -folate  Diarrhea -Check stool for C. Difficile -Stool pathogen panel -07/04/2023 CT AP--small hiatus hernia, diverticulosis, stable left adrenal adenoma, left adnexal cystic benign-appearing; no bowel wall thickening  UTI -UA>50 WBC -Continue ceftriaxone pending culture data  Uncontrolled diabetes mellitus type 2 with hyperglycemia -NovoLog sliding scale -05/02/2023 hemoglobin A1c 8.5 -Holding metformin  Coronary artery disease -Patient had recent STEMI 12/2022 with PCI to the mid-proximal RCA -Continue Brilinta and aspirin -Continue statin -Continue metoprolol succinate  Essential hypertension -Continue metoprolol succinate  Major neurocognitive disorder -Patient is at risk for hospital delirium -Holding gabapentin temporarily  Hypokalemia -Replete -Check magnesium        Total time spent 50 minutes.  Greater than 50% spent face to face counseling and coordinating care.      Subjective: Patient denies fevers, chills, headache, chest pain, dyspnea, nausea, vomiting, diarrhea, abdominal pain, dysuria, hematuria,    Objective: Vitals:   07/05/23 0200 07/05/23 0225 07/05/23 0532 07/05/23 0535  BP: (!) 114/49 (!) 119/53 101/60   Pulse: 68 (!) 57  62  Resp: (!) 22 (!) 22 20   Temp:  98.2 F (36.8 C) 98.4 F (36.9 C)   TempSrc:  Oral    SpO2: 98% 99% 99% 98%  Weight:      Height:        Intake/Output Summary (Last 24 hours) at 07/05/2023 0848 Last data filed at 07/05/2023 0108 Gross per 24 hour  Intake 1100 ml  Output 500 ml  Net 600 ml  Weight change:  Exam:  General:  Pt is alert, follows commands appropriately, not in acute distress HEENT: No icterus, No thrush, No neck mass, Jenkins/AT Cardiovascular: RRR, S1/S2, no rubs, no gallops Respiratory: CTA bilaterally, no wheezing, no crackles,  no rhonchi Abdomen: Soft/+BS, non tender, non distended, no guarding Extremities: No edema, No lymphangitis, No petechiae, No rashes, no synovitis   Data Reviewed: I have personally reviewed following labs and imaging studies Basic Metabolic Panel: Recent Labs  Lab 07/04/23 2018 07/05/23 0522  NA 135 134*  K 4.0 3.2*  CL 105 104  CO2 19* 21*  GLUCOSE 148* 113*  BUN 22 21  CREATININE 0.97 0.80  CALCIUM 9.6 8.6*  MG  --  1.8  PHOS  --  3.3   Liver Function Tests: Recent Labs  Lab 07/04/23 2018 07/05/23 0522  AST 21 12*  ALT 13 10  ALKPHOS 94 71  BILITOT 0.6 0.6  PROT 6.6 5.4*  ALBUMIN 3.4* 2.8*   Recent Labs  Lab 07/04/23 2018  LIPASE 31   No results for input(s): "AMMONIA" in the last 168 hours. Coagulation Profile: No results for input(s): "INR", "PROTIME" in the last 168 hours. CBC: Recent Labs  Lab 07/04/23 2052 07/05/23 0522  WBC 10.0 7.7  HGB 15.0 11.6*  HCT 47.2* 35.8*  MCV 88.6 88.2  PLT 194 171   Cardiac Enzymes: No results for input(s): "CKTOTAL", "CKMB", "CKMBINDEX", "TROPONINI" in the last 168 hours. BNP: Invalid input(s): "POCBNP" CBG: Recent Labs  Lab 06/28/23 2031 06/29/23 0716 06/29/23 1107 07/04/23 1922 07/05/23 0726  GLUCAP 157* 124* 154* 145* 103*   HbA1C: No results for input(s): "HGBA1C" in the last 72 hours. Urine analysis:    Component Value Date/Time   COLORURINE YELLOW 07/04/2023 2246   APPEARANCEUR CLOUDY (A) 07/04/2023 2246   APPEARANCEUR Clear 10/25/2021 1450   LABSPEC 1.013 07/04/2023 2246   PHURINE 8.0 07/04/2023 2246   GLUCOSEU NEGATIVE 07/04/2023 2246   HGBUR SMALL (A) 07/04/2023 2246   BILIRUBINUR NEGATIVE 07/04/2023 2246   BILIRUBINUR Negative 10/25/2021 1450   KETONESUR NEGATIVE 07/04/2023 2246   PROTEINUR 100 (A) 07/04/2023 2246   UROBILINOGEN 0.2 09/06/2009 0135   NITRITE POSITIVE (A) 07/04/2023 2246   LEUKOCYTESUR LARGE (A) 07/04/2023 2246   Sepsis  Labs: @LABRCNTIP (procalcitonin:4,lacticidven:4) ) Recent Results (from the past 240 hour(s))  Blood culture (routine x 2)     Status: None (Preliminary result)   Collection Time: 07/04/23  9:38 PM   Specimen: BLOOD RIGHT ARM  Result Value Ref Range Status   Specimen Description BLOOD RIGHT ARM  Final   Special Requests   Final    BOTTLES DRAWN AEROBIC AND ANAEROBIC Blood Culture adequate volume   Culture   Final    NO GROWTH < 12 HOURS Performed at [facility], [address], [place], [place] [address]    Report Status PENDING  Incomplete  Culture, blood (Routine X 2) w Reflex to ID Panel     Status: None (Preliminary result)   Collection Time: 07/05/23 12:01 AM   Specimen: BLOOD  Result Value Ref Range Status   Specimen Description BLOOD BLOOD RIGHT ARM  Final   Special Requests   Final    BOTTLES DRAWN AEROBIC AND ANAEROBIC Blood Culture adequate volume   Culture   Final    NO GROWTH < 12 HOURS Performed at [facility], [address] [place], [place] [address]    Report Status PENDING  Incomplete     Scheduled Meds:  aspirin  EC  81 mg Oral Daily   enoxaparin (LOVENOX) injection  40 mg Subcutaneous Daily   feeding supplement (GLUCERNA SHAKE)  237 mL Oral TID BM   insulin aspart  0-15 Units Subcutaneous TID WC   metoprolol succinate  50 mg Oral QHS   simvastatin  20 mg Oral QPM   ticagrelor  90 mg Oral BID   Continuous Infusions:  ceFEPime (MAXIPIME) IV      Procedures/Studies: CT ABDOMEN PELVIS WO CONTRAST  Result Date: 07/04/2023 CLINICAL DATA:  Vomiting and altered mental status. EXAM: CT ABDOMEN AND PELVIS WITHOUT CONTRAST TECHNIQUE: Multidetector CT imaging of the abdomen and pelvis was performed following the standard protocol without IV contrast. RADIATION DOSE REDUCTION: This exam was performed according to the departmental dose-optimization program which includes automated exposure control, adjustment of the mA and/or kV according to patient size and/or use  of iterative reconstruction technique. COMPARISON:  June 28, 2023 FINDINGS: Lower chest: No acute abnormality. Hepatobiliary: No focal liver abnormality is seen. No gallstones, gallbladder wall thickening, or biliary dilatation. Pancreas: Unremarkable. No pancreatic ductal dilatation or surrounding inflammatory changes. Spleen: Normal in size without focal abnormality. Adrenals/Urinary Tract: A stable 11 mm diameter low-attenuation left adrenal mass is seen (approximately 15.10 Hounsfield units). The right adrenal gland is unremarkable. Kidneys are normal in size, without renal calculi or focal lesions. Mild, stable right extrarenal pelvis prominence is noted. Bladder is unremarkable. Stomach/Bowel: There is a small hiatal hernia. The appendix is surgically absent. No evidence of bowel wall thickening, distention, or inflammatory changes. Noninflamed diverticula are seen along the junction of the descending and sigmoid colon. Vascular/Lymphatic: Aortic atherosclerosis. No enlarged abdominal or pelvic lymph nodes. Reproductive: Status post hysterectomy. The right adnexa is unremarkable. A 3.3 cm x 3.0 cm left adnexal cyst is seen. This is decreased in size when compared to the prior study. Other: No abdominal wall hernia or abnormality. No abdominopelvic ascites. Musculoskeletal: Stable degenerative and postoperative changes are seen within the lower lumbar spine. IMPRESSION: 1. Small hiatal hernia. 2. Colonic diverticulosis. 3. Stable left adrenal adenoma. No follow-up imaging is recommended. This recommendation follows ACR consensus guidelines: Management of Incidental Adrenal Masses: A White Paper of the ACR Incidental Findings Committee. J Am Coll Radiol 2017;14:1038-1044. 4. Benign-appearing simple left adnexal cyst. No follow-up imaging is recommended. This recommendation follows ACR consensus guidelines: White Paper of the ACR Incidental Findings Committee II on Adnexal Findings. J Am Coll Radiol  2013:10:675-681. 5. Aortic atherosclerosis. Aortic Atherosclerosis (ICD10-I70.0). Electronically Signed   By: Aram Candela M.D.   On: 07/04/2023 22:19   CT ABDOMEN PELVIS W CONTRAST  Result Date: 06/28/2023 CLINICAL DATA:  Lower abdominal pain, diarrhea. EXAM: CT ABDOMEN AND PELVIS WITH CONTRAST TECHNIQUE: Multidetector CT imaging of the abdomen and pelvis was performed using the standard protocol following bolus administration of intravenous contrast. RADIATION DOSE REDUCTION: This exam was performed according to the departmental dose-optimization program which includes automated exposure control, adjustment of the mA and/or kV according to patient size and/or use of iterative reconstruction technique. CONTRAST:  OMNIPAQUE IOHEXOL 300 MG/ML  SOLN COMPARISON:  05/01/2023 FINDINGS: Lower chest: No acute abnormality. Hepatobiliary: No focal liver lesions. No gallstones, gallbladder wall thickening or inflammation. No significant biliary ductal dilatation. Pancreas: Unremarkable. No pancreatic ductal dilatation or surrounding inflammatory changes. Spleen: Normal in size without focal abnormality. Adrenals/Urinary Tract: Normal right adrenal gland. Left adrenal nodule measures 1.1 cm. This is been present since 09/06/2009. Findings compatible with a benign adenoma. No follow-up imaging advised. No  nephrolithiasis or hydronephrosis. Urinary bladder appears within normal limits. Stomach/Bowel: The stomach appears nondistended. Mild wall thickening involving the distal esophagus and stomach noted. The appendix is not visualized and may be surgically absent. No pathologic dilatation of the large or small bowel loops to suggest obstruction. Terminal ileum is visualized and is normal without wall thickening or inflammation. Mild diffuse mucosal enhancement and equivocal wall thickening involving the colon, which is most notable at the level of the sigmoid colon. No significant surrounding inflammatory fat  stranding. Moderate retained stool identified within the rectum. Vascular/Lymphatic: Aortic atherosclerosis. No aneurysm. No signs of abdominopelvic adenopathy. Reproductive: Status post hysterectomy. Again seen is a triangular-shaped fluid density structure along the broad ligament which measures 4.1 x 3.7 cm, image 65/2. This is unchanged from the previous exam and is favored to represent a benign process. Other: No significant free fluid within the abdomen or pelvis. No signs of pneumoperitoneum. Musculoskeletal: No acute or suspicious osseous findings. Lumbar spondylosis. Status post right lateral plate and screw fixation with interbody fusion at L4-5. Marked degenerative disc disease noted at L2-3, L3-4 and L5-S1. IMPRESSION: 1. Mild diffuse mucosal enhancement and equivocal wall thickening involving the colon, most notable at the level of the sigmoid colon. Findings may reflect a mild colitis. 2. Mild wall thickening involving the distal esophagus and stomach, which may reflect esophagitis/gastritis. Clinical correlation advised. 3. Moderate retained stool within the rectum. 4. Stable fluid density, triangular-shaped structure along the broad ligament of the left hemipelvis status post hysterectomy. This is favored to represent a benign process. 5. Aortic Atherosclerosis (ICD10-I70.0). Electronically Signed   By: Signa Kell M.D.   On: 06/28/2023 10:32    Catarina Hartshorn, DO  [facility]  If 7PM-7AM, please contact night-coverage [email or web address] Password [facility] 07/05/2023, 8:48 AM   LOS: 0 days

## 2023-07-05 NOTE — H&P (Signed)
History and Physical    Patient: [name] ZOX:[id number] DOB: 05/28/1929 DOA: 07/04/2023 DOS: the patient was seen and examined on 07/05/2023 PCP: Assunta Found, MD  Patient coming from: Home  Chief Complaint:  Chief Complaint  Patient presents with   Altered Mental Status   Emesis   HPI: [name] is a 89 y.o. female with medical history significant of hypertension, hyperlipidemia T2DM, urinary incontinence and stool, severe dementia, c history of STEMI and CAD, hronic constipation with history of fecal impaction who presents to the emergency department due to 2-day onset of altered mental status and vomiting.  Patient was sent to have had similar presentation when she had UTI. Patient was recently admitted from 06/28/2023 to 06/29/23 with due to fecal impaction which required manual disimpaction done in the ED and molasses enema was also given and patient was started on MiraLAX twice daily with good results. Patient was also admitted from 5/18 through 5/12 due to acute metabolic encephalopathy secondary to Enterobacter UTI.  She presented with increased confusion, generalized weakness and failure to thrive over the past few days prior to admission.  She was treated with IV Rocephin and this was transitioned to ciprofloxacin on discharge to complete therapy.  ED Course:  In the emergency department, temperature was 98.3 F on arrival, but few hours later, she became febrile with a temperature of 101.23F.  BP was 162/65, other vital signs were within normal range.  Workup in the ED showed WBC 10.0, hemoglobin 15.0, hematocrit 47.2, MCV 88.6, platelets 194.  BMP was normal except for bicarb of 19, blood glucose 148, albumin 3.4.  Lactic acid 1.7, lipase 31, urinalysis was positive for UTI.  Blood culture pending. CT abdomen and pelvis without contrast showed small hiatal hernia.  Stable left adrenal adenoma.  Benign appearing simple left adnexal cyst. She was treated with IV  ceftriaxone, Tylenol 1000 mg oral x 1 was given.  IV hydration was provided.  Hospitalist was asked to admit patient for further evaluation and management.  Review of Systems: Review of systems as noted in the HPI. All other systems reviewed and are negative.   Past Medical History:  Diagnosis Date   CAD (coronary artery disease)    a. 12/2022 Inf STEMI/PCI: LM nl, LAD mild diff dzs, D1 50, D2 50., LCX 75d, OM1 60, RCA 90p (2.5x20 Synergy XD DES), 100d (PTCA-->90 residual).   Diabetes mellitus with neuropathy    Diabetic neuropathy (HCC)    Diastolic dysfunction    a. 12/2022 Echo: EF 60-65%, no rwma, GrI DD, nl RV size/fxn, triv MR.   Hypertension    Stroke [facility])    Urinary incontinence    Past Surgical History:  Procedure Laterality Date   ABDOMINAL HYSTERECTOMY     APPENDECTOMY     BREAST SURGERY     begnin tumor removed   CATARACT EXTRACTION, BILATERAL     CORONARY ANGIOGRAPHY N/A 12/31/2022   Procedure: CORONARY ANGIOGRAPHY;  Surgeon: Yvonne Kendall, MD;  Location: [facility] INVASIVE CV LAB;  Service: Cardiovascular;  Laterality: N/A;   CORONARY/GRAFT ACUTE MI REVASCULARIZATION N/A 12/31/2022   Procedure: Coronary/Graft Acute MI Revascularization;  Surgeon: Yvonne Kendall, MD;  Location: [facility] INVASIVE CV LAB;  Service: Cardiovascular;  Laterality: N/A;    Social History:  reports that she has never smoked. She has never used smokeless tobacco. She reports that she does not drink alcohol and does not use drugs.   Allergies  Allergen Reactions   Codeine Nausea And Vomiting  Glipizide     Hypoglycemia with glucoses recorded in the 20s necessitating glucagon and D10 IV   Sulfa Antibiotics Other (See Comments)    Unspecified     Family History  Problem Relation Age of Onset   CAD Mother    Hypertension Mother    Stroke Father    Heart attack Brother    Cancer Brother      Prior to Admission medications   Medication Sig Start Date End Date Taking? Authorizing Provider   acetaminophen (TYLENOL) 500 MG tablet Take 500 mg by mouth every 6 (six) hours as needed for mild pain.    [provider]  aspirin EC 81 MG tablet Take 1 tablet (81 mg total) by mouth daily. Swallow whole. 01/05/23   Dunn, Tacey Ruiz, PA-C  blood glucose meter kit and supplies KIT Dispense based on patient and insurance preference. Use to TEST BLOOD GLUCOSE THREE times daily as directed. DX E11.65 02/10/21   Roma Kayser, MD  Cholecalciferol (VITAMIN D3) 5000 units CAPS Take 1 capsule (5,000 Units total) by mouth daily. 04/15/17   Roma Kayser, MD  Continuous Glucose Receiver (DEXCOM G6 RECEIVER) DEVI CHECK GLUCOSE AS NEEDED 06/24/23   [provider]  Continuous Glucose Sensor (DEXCOM G6 SENSOR) MISC SMARTSIG:1 Topical Every 10 Days 06/24/23   [provider]  Continuous Glucose Transmitter (DEXCOM G6 TRANSMITTER) MISC USE TO CHECK BLOOD SUGAR 06/24/23   [provider]  diclofenac Sodium (VOLTAREN ARTHRITIS PAIN) 1 % GEL Apply 2 g topically 3 (three) times daily. bilateral knees for osteoarthritis 07/25/22   Sharee Holster, NP  gabapentin (NEURONTIN) 300 MG capsule Take 1 capsule (300 mg total) by mouth 2 (two) times daily. Take 300 mg three times daily 05/05/23   Vassie Loll, MD  Insulin Pen Needle (BD AUTOSHIELD DUO) 30G X 5 MM MISC by Does not apply route as directed. 30 gauge X 3/16    [provider]  metFORMIN (GLUCOPHAGE) 500 MG tablet Take 500 mg by mouth daily. 06/26/23   [provider]  metoprolol succinate (TOPROL-XL) 50 MG 24 hr tablet Take 50 mg by mouth at bedtime. 04/01/23   [provider]  polyethylene glycol (MIRALAX / GLYCOLAX) 17 g packet Take 17 g by mouth daily. 06/29/23   Johnson, Clanford L, MD  sertraline (ZOLOFT) 25 MG tablet Take 25 mg by mouth daily. Take with 50mg  dose to equal 75mg  daily.    [provider]  sertraline (ZOLOFT) 50 MG tablet Take 50 mg by mouth daily. Take with 25mg  dose to  equal 75mg  daily.    [provider]  silodosin (RAPAFLO) 8 MG CAPS capsule Take 1 capsule (8 mg total) by mouth daily with breakfast. 10/10/22   Summerlin, Regan Rakers, PA-C  simvastatin (ZOCOR) 20 MG tablet Take 20 mg by mouth every evening. 06/24/23   [provider]  ticagrelor (BRILINTA) 90 MG TABS tablet Take 1 tablet (90 mg total) by mouth 2 (two) times daily. 01/04/23   Dunn, Tacey Ruiz, PA-C  Trospium Chloride 60 MG CP24 Take 1 capsule by mouth daily. 04/01/23   [provider]  zolpidem (AMBIEN) 10 MG tablet Take 10 mg by mouth at bedtime as needed for sleep. 01/22/23   [provider]    Physical Exam: BP (!) 119/53 (BP Location: Left Arm)   Pulse (!) 57   Temp 98.2 F (36.8 C) (Oral)   Resp (!) 22   Ht 5\' 7"  (1.702 m)  Wt 73 kg   SpO2 99%   BMI 25.21 kg/m   General: 89 y.o. year-old female well developed well nourished in no acute distress.  Alert and oriented x3. HEENT: NCAT, EOMI Neck: Supple, trachea medial Cardiovascular: Regular rate and rhythm with no rubs or gallops.  No thyromegaly or JVD noted.  No lower extremity edema. 2/4 pulses in all 4 extremities. Respiratory: Clear to auscultation with no wheezes or rales. Good inspiratory effort. Abdomen: Soft, nontender nondistended with normal bowel sounds x4 quadrants. Muskuloskeletal: No cyanosis, clubbing or edema noted bilaterally Neuro: CN II-XII intact, strength 5/5 x 4, sensation, reflexes intact Skin: No ulcerative lesions noted or rashes Psychiatry: Judgement and insight appear normal. Mood is appropriate for condition and setting          Labs on Admission:  Basic Metabolic Panel: Recent Labs  Lab 06/28/23 0843 07/04/23 2018  NA 137 135  K 3.7 4.0  CL 104 105  CO2 25 19*  GLUCOSE 153* 148*  BUN 18 22  CREATININE 0.78 0.97  CALCIUM 9.1 9.6   Liver Function Tests: Recent Labs  Lab 06/28/23 0843 07/04/23 2018  AST 18 21  ALT 15 13  ALKPHOS 98 94  BILITOT 0.4  0.6  PROT 6.3* 6.6  ALBUMIN 3.2* 3.4*   Recent Labs  Lab 06/28/23 0843 07/04/23 2018  LIPASE 30 31   No results for input(s): "AMMONIA" in the last 168 hours. CBC: Recent Labs  Lab 06/28/23 0843 07/04/23 2052  WBC 7.3 10.0  HGB 12.7 15.0  HCT 40.0 47.2*  MCV 87.5 88.6  PLT 198 194   Cardiac Enzymes: No results for input(s): "CKTOTAL", "CKMB", "CKMBINDEX", "TROPONINI" in the last 168 hours.  BNP (last 3 results) Recent Labs    12/31/22 2020 05/02/23 1308 05/04/23 0407  BNP 510.0* 553.0* 163.0*    ProBNP (last 3 results) No results for input(s): "PROBNP" in the last 8760 hours.  CBG: Recent Labs  Lab 06/28/23 1618 06/28/23 2031 06/29/23 0716 06/29/23 1107 07/04/23 1922  GLUCAP 111* 157* 124* 154* 145*    Radiological Exams on Admission: CT ABDOMEN PELVIS WO CONTRAST  Result Date: 07/04/2023 CLINICAL DATA:  Vomiting and altered mental status. EXAM: CT ABDOMEN AND PELVIS WITHOUT CONTRAST TECHNIQUE: Multidetector CT imaging of the abdomen and pelvis was performed following the standard protocol without IV contrast. RADIATION DOSE REDUCTION: This exam was performed according to the departmental dose-optimization program which includes automated exposure control, adjustment of the mA and/or kV according to patient size and/or use of iterative reconstruction technique. COMPARISON:  June 28, 2023 FINDINGS: Lower chest: No acute abnormality. Hepatobiliary: No focal liver abnormality is seen. No gallstones, gallbladder wall thickening, or biliary dilatation. Pancreas: Unremarkable. No pancreatic ductal dilatation or surrounding inflammatory changes. Spleen: Normal in size without focal abnormality. Adrenals/Urinary Tract: A stable 11 mm diameter low-attenuation left adrenal mass is seen (approximately 15.10 Hounsfield units). The right adrenal gland is unremarkable. Kidneys are normal in size, without renal calculi or focal lesions. Mild, stable right extrarenal pelvis  prominence is noted. Bladder is unremarkable. Stomach/Bowel: There is a small hiatal hernia. The appendix is surgically absent. No evidence of bowel wall thickening, distention, or inflammatory changes. Noninflamed diverticula are seen along the junction of the descending and sigmoid colon. Vascular/Lymphatic: Aortic atherosclerosis. No enlarged abdominal or pelvic lymph nodes. Reproductive: Status post hysterectomy. The right adnexa is unremarkable. A 3.3 cm x 3.0 cm left adnexal cyst is seen. This is decreased in size when compared to the  prior study. Other: No abdominal wall hernia or abnormality. No abdominopelvic ascites. Musculoskeletal: Stable degenerative and postoperative changes are seen within the lower lumbar spine. IMPRESSION: 1. Small hiatal hernia. 2. Colonic diverticulosis. 3. Stable left adrenal adenoma. No follow-up imaging is recommended. This recommendation follows ACR consensus guidelines: Management of Incidental Adrenal Masses: A White Paper of the ACR Incidental Findings Committee. J Am Coll Radiol 2017;14:1038-1044. 4. Benign-appearing simple left adnexal cyst. No follow-up imaging is recommended. This recommendation follows ACR consensus guidelines: White Paper of the ACR Incidental Findings Committee II on Adnexal Findings. J Am Coll Radiol 2013:10:675-681. 5. Aortic atherosclerosis. Aortic Atherosclerosis (ICD10-I70.0). Electronically Signed   By: Aram Candela M.D.   On: 07/04/2023 22:19    EKG: I independently viewed the EKG done and my findings are as followed: Normal sinus rhythm at a rate of 65 bpm  Assessment/Plan Present on Admission:  UTI (urinary tract infection)  Acute metabolic encephalopathy  Essential hypertension  Hyperlipidemia associated with type 2 diabetes mellitus (HCC)  Uncontrolled type 2 diabetes mellitus with hyperglycemia, without long-term current use of insulin (HCC)  CAD (coronary artery disease) of artery bypass graft  Principal Problem:   UTI  (urinary tract infection) Active Problems:   Acute metabolic encephalopathy   Essential hypertension   Hyperlipidemia associated with type 2 diabetes mellitus (HCC)   Uncontrolled type 2 diabetes mellitus with hyperglycemia, without long-term current use of insulin (HCC)   CAD (coronary artery disease) of artery bypass graft   Hypoalbuminemia due to protein-calorie malnutrition (HCC)  Acute metabolic encephalopathy possibly secondary to UTI POA Urine culture done on 05/01/2023 was positive for Enterobacter cloacae which was sensitive to cefepime Urine culture done on 02/11/2023 was positive for proteus mirabilis which was also sensitive to cefepime. Patient was started on IV ceftriaxone, we shall continue with IV cefepime with goal to de-escalate/discontinue based on urine culture Continue Tylenol as needed for fever  Hypoalbuminemia possibly secondary to mild protein calorie malnutrition Albumin 3.4, protein supplement will be provided  Uncontrolled T2DM with hyperglycemia Hemoglobin A1c on 05/02/2023 was 8.5 indicating that diabetes was not well-controlled Continue ISS and hypoglycemia protocol  Mixed hyperlipidemia Continue Zocor  Essential hypertension Continue Toprol-XL with holding parameters  CAD s/p CABG Continue aspirin, Brilinta, Toprol-XL, statin  DVT prophylaxis: Lovenox  Advance Care Planning: DNR  Consults: None  Family Communication: None at bedside  Severity of Illness: The appropriate patient status for this patient is INPATIENT. Inpatient status is judged to be reasonable and necessary in order to provide the required intensity of service to ensure the patient's safety. The patient's presenting symptoms, physical exam findings, and initial radiographic and laboratory data in the context of their chronic comorbidities is felt to place them at high risk for further clinical deterioration. Furthermore, it is not anticipated that the patient will be medically stable  for discharge from the hospital within 2 midnights of admission.   * I certify that at the point of admission it is my clinical judgment that the patient will require inpatient hospital care spanning beyond 2 midnights from the point of admission due to high intensity of service, high risk for further deterioration and high frequency of surveillance required.*  Author: Frankey Shown, DO 07/05/2023 5:17 AM  For on call review [email or web address].

## 2023-07-05 NOTE — Progress Notes (Signed)
Informed attending that pt has not had bm to collect ifficile Quick Screen w PCR reflex

## 2023-07-06 ENCOUNTER — Inpatient Hospital Stay (HOSPITAL_COMMUNITY): Payer: Medicare Other

## 2023-07-06 DIAGNOSIS — E1165 Type 2 diabetes mellitus with hyperglycemia: Secondary | ICD-10-CM | POA: Diagnosis not present

## 2023-07-06 DIAGNOSIS — G9341 Metabolic encephalopathy: Secondary | ICD-10-CM | POA: Diagnosis not present

## 2023-07-06 DIAGNOSIS — N3001 Acute cystitis with hematuria: Secondary | ICD-10-CM | POA: Diagnosis not present

## 2023-07-06 DIAGNOSIS — I10 Essential (primary) hypertension: Secondary | ICD-10-CM | POA: Diagnosis not present

## 2023-07-06 LAB — CBC
HCT: 40.9 % (ref 36.0–46.0)
Hemoglobin: 13 g/dL (ref 12.0–15.0)
MCH: 28.3 pg (ref 26.0–34.0)
MCHC: 31.8 g/dL (ref 30.0–36.0)
MCV: 89.1 fL (ref 80.0–100.0)
Platelets: 199 10*3/uL (ref 150–400)
RBC: 4.59 MIL/uL (ref 3.87–5.11)
RDW: 14.6 % (ref 11.5–15.5)
WBC: 9 10*3/uL (ref 4.0–10.5)
nRBC: 0 % (ref 0.0–0.2)

## 2023-07-06 LAB — BASIC METABOLIC PANEL
Anion gap: 9 (ref 5–15)
BUN: 18 mg/dL (ref 8–23)
CO2: 24 mmol/L (ref 22–32)
Calcium: 9.1 mg/dL (ref 8.9–10.3)
Chloride: 103 mmol/L (ref 98–111)
Creatinine, Ser: 0.76 mg/dL (ref 0.44–1.00)
GFR, Estimated: >60 mL/min (ref >60)
Glucose, Bld: 183 mg/dL — ABNORMAL HIGH (ref 70–99)
Potassium: 3.5 mmol/L (ref 3.5–5.1)
Sodium: 136 mmol/L (ref 135–145)

## 2023-07-06 LAB — GLUCOSE, CAPILLARY
Glucose-Capillary: 193 mg/dL — ABNORMAL HIGH (ref 70–99)
Glucose-Capillary: 140 mg/dL — ABNORMAL HIGH (ref 70–99)

## 2023-07-06 LAB — MAGNESIUM: Magnesium: 1.6 mg/dL — ABNORMAL LOW (ref 1.7–2.4)

## 2023-07-06 MED ORDER — HALOPERIDOL LACTATE 5 MG/ML IJ SOLN
5.0000 mg | Freq: Four times a day (QID) | INTRAMUSCULAR | Status: DC | PRN
  Administered 2023-07-06: 5 mg via INTRAMUSCULAR
  Filled 2023-07-06: qty 1

## 2023-07-06 MED ORDER — DIAZEPAM 5 MG/ML IJ SOLN: 2.5000 mg | Freq: Once | INTRAMUSCULAR | Status: DC

## 2023-07-06 MED ORDER — DIAZEPAM 5 MG/ML IJ SOLN
5.0000 mg | Freq: Four times a day (QID) | INTRAMUSCULAR | Status: DC | PRN
  Administered 2023-07-07: 5 mg via INTRAVENOUS
  Filled 2023-07-06: qty 2

## 2023-07-06 MED ORDER — MELATONIN 3 MG PO TABS
6.0000 mg | ORAL_TABLET | Freq: Once | ORAL | Status: AC
  Administered 2023-07-06: 6 mg via ORAL
  Filled 2023-07-06: qty 2

## 2023-07-06 MED ORDER — HALOPERIDOL LACTATE 5 MG/ML IJ SOLN
2.0000 mg | Freq: Once | INTRAMUSCULAR | Status: AC
  Administered 2023-07-06: 2 mg via INTRAMUSCULAR
  Filled 2023-07-06: qty 1

## 2023-07-06 MED ORDER — DIAZEPAM 5 MG/ML IJ SOLN
5.0000 mg | Freq: Once | INTRAMUSCULAR | Status: AC
  Administered 2023-07-06: 5 mg via INTRAMUSCULAR
  Filled 2023-07-06: qty 2

## 2023-07-06 MED ORDER — SODIUM CHLORIDE 0.9 % IV SOLN
1.0000 g | INTRAVENOUS | Status: DC
  Administered 2023-07-06 – 2023-07-07 (×2): 1 g via INTRAVENOUS
  Filled 2023-07-06 (×4): qty 10

## 2023-07-06 NOTE — Progress Notes (Signed)
EKG completed for patient and sent to doctor for review. Order for haldol requested and received.

## 2023-07-06 NOTE — Plan of Care (Signed)
  Problem: Clinical Measurements: Goal: Cardiovascular complication will be avoided Outcome: Adequate for Discharge   Problem: Clinical Measurements: Goal: Diagnostic test results will improve Outcome: Progressing

## 2023-07-06 NOTE — TOC Initial Note (Signed)
Transition of Care [facility]) - Initial/Assessment Note    Patient Details  Name: [name] MRN: [id number] Date of Birth: 1929/12/12  Transition of Care (TOC) CM/SW Contact:    Catalina Gravel, LCSW Phone Number: 07/06/2023, 3:54 PM  Clinical Narrative:                 Pt is high risk for readmission. CSW completed assessment with pts daughter. Pt and her 89 year old spouse live alone.  The family has 5.5 hours of home care daily  to assist with ADLs. Family members, support appointment transportation. Pt has a wc, there is a ramp on the home, pt has a hospital bed, and bsc.  Pt is not using the bsc, she is wearing diapers per daughter. Daughter describe mom behavior as combative and they are concerned as a family, but pt will likely not be open to in home services if recommended.  They will discuss as a family.  TOC to follow.    Expected Discharge Plan: Long Term Acute Care (LTAC) Barriers to Discharge: Continued Medical Work up   Patient Goals and CMS Choice Patient states their goals for this hospitalization and ongoing recovery are:: Pt wants to return home, family concerned.   Choice offered to / list presented to : Patient      Expected Discharge Plan and Services In-house Referral: Clinical Social Work     Living arrangements for the past 2 months: Single Family Home (With spouse)                                      Prior Living Arrangements/Services Living arrangements for the past 2 months: Single Family Home (With spouse) Lives with:: Spouse Patient language and need for interpreter reviewed:: Yes Do you feel safe going back to the place where you live?: Yes      Need for Family Participation in Patient Care: Yes (Comment) Care giver support system in place?: Yes (comment) Current home services: Homehealth aide [facility].) Criminal Activity/Legal Involvement Pertinent to Current Situation/Hospitalization: No - Comment as  needed  Activities of Daily Living      Permission Sought/Granted                  Emotional Assessment Appearance:: Appears stated age Attitude/Demeanor/Rapport: Combative (Pulled IV out-mitts used.) Affect (typically observed): Agitated Orientation: : Oriented to Self Alcohol / Substance Use: Not Applicable    Admission diagnosis:  UTI (urinary tract infection) [N39.0] Acute cystitis without hematuria [N30.00] Patient Active Problem List   Diagnosis Date Noted   Hypoalbuminemia due to protein-calorie malnutrition (HCC) 07/05/2023   Impaction of colon (HCC) 06/28/2023   DNR (do not resuscitate) 06/28/2023   Dehydration 05/05/2023   Hypoglycemia 02/10/2023   Chest pain 02/10/2023   Depression 02/10/2023   CAD (coronary artery disease) of artery bypass graft 01/04/2023   Pressure injury of skin 01/04/2023   Physical debility 01/04/2023   Leukocytosis 01/04/2023   Acute ST elevation myocardial infarction (STEMI) of inferior wall (HCC) 12/31/2022   SARS-CoV-2 positive 08/24/2022   SARS-CoV-2 antibody positive 08/23/2022   Emphysema of lung (HCC) 08/21/2022   Vascular dementia without behavioral disturbance (HCC) 08/21/2022   Decubitus ulcer of buttock, stage 2 (HCC) 08/13/2022   Hypomagnesemia 08/03/2022   Hypoglycemia associated with diabetes (HCC) 08/02/2022   Hypokalemia 08/02/2022   Uncontrolled type 2 diabetes mellitus with hyperglycemia, without long-term current  use of insulin (HCC) 07/19/2022   Diabetic neuropathy associated with type 2 diabetes mellitus (HCC) 07/16/2022   Hypertension associated with type 2 diabetes mellitus (HCC) 07/16/2022   Hyperlipidemia associated with type 2 diabetes mellitus (HCC) 07/16/2022   Chronic insomnia 07/16/2022   Bilateral lower extremity edema 07/16/2022   Chronic constipation 07/16/2022   Aortic atherosclerosis (HCC) 07/16/2022   Major neurocognitive disorder (HCC) 07/16/2022   DJD (degenerative joint disease) of knee  07/14/2022   UTI (urinary tract infection) 07/09/2022   Late effects of cerebrovascular disease 09/25/2021   Acute metabolic encephalopathy 07/14/2019   Cellulitis of buttock 07/14/2019   Hyperlipidemia 02/20/2016   Essential hypertension 10/10/2015   Controlled type 2 diabetes with neuropathy (HCC) 10/01/2008   PCP:  Assunta Found, MD Pharmacy:   [facility] [place] [place] - [address] [place] [address] Phone: [phone] Fax: [phone]     Social Determinants of Health (SDOH) Social History: SDOH Screenings   Food Insecurity: No Food Insecurity (02/10/2023)  Housing: Low Risk  (02/10/2023)  Transportation Needs: No Transportation Needs (02/10/2023)  Utilities: Not At Risk (02/10/2023)  Physical Activity: Inactive (02/08/2019)  Social Connections: Unknown (02/08/2019)  Stress: No Stress Concern Present (02/08/2019)  Tobacco Use: Low Risk  (07/04/2023)   SDOH Interventions:     Readmission Risk Interventions    07/06/2023    3:49 PM 02/12/2023   11:26 AM 01/04/2023   11:03 AM  Readmission Risk Prevention Plan  Transportation Screening Complete Complete Complete  PCP or Specialist Appt within 3-5 Days Complete    HRI or Home Care Consult Complete Complete   Social Work Consult for Recovery Care Planning/Counseling Complete Complete Complete  Palliative Care Screening Not Applicable Not Applicable   Medication Review Oceanographer) Complete Complete

## 2023-07-06 NOTE — Progress Notes (Signed)
PROGRESS NOTE  [name] EXB:[id number] DOB: Aug 02, 1929 DOA: 07/04/2023 PCP: Assunta Found, MD  Brief History:  89 year old female with a history of dementia, hypertension, diabetes mellitus type 2, hyperlipidemia, stroke, coronary artery disease status post STEMI 12/2022, and constipation presenting with nausea, vomiting, and altered mental status.  The patient has had numerous similar admissions in the recent past.  Notably, the patient had recent hospital admission from 06/28/2023 to 06/29/2023 at which time the patient was treated for fecal impaction.  She was manually disimpacted and discharged home with instructions to increase MiraLAX as needed if no BM in 3 days.  Another recent hospital admission from 05/01/2023 to 05/05/2023 was noted for UTI for which she was discharged home with ciprofloxacin after treatment with ceftriaxone in the hospital.  She also had a hospital admission from 02/10/2023 to 02/13/2023 secondary to hypoglycemia and a Proteus UTI. Upon further questioning, the patient is a poor historian, and she has poor insight regarding her medications. Patient's daughter states that the patient has had at least 5-6 loose stools on a daily basis since discharge from the hospital on 06/29/2023.  There is no hematochezia or melena.  There is no hematemesis.  Her appetite has been worse than usual.  There is been no coughing, hemoptysis, chest pain, shortness breath, abdominal pain noted.  The patient does require assistance with transfers.  In the ED, the patient was febrile up to 101.6 F.  She was hemodynamically stable with oxygen saturation 99% room air.  WBC 10.0, hemoglobin 15.0, platelets 171.  Sodium 135, potassium 4.0, bicarbonate 19, serum creatinine 0.97.  LFTs were unremarkable.  CT of the abdomen and pelvis showed a small hiatus hernia, diverticulosis, stable left adrenal adenoma, and a benign-appearing left adnexal cyst.  The patient was started on ceftriaxone initially  and IV fluids.   Assessment/Plan: Acute metabolic encephalopathy -Secondary to UTI and dehydration -Continue IV fluids  -7/13--patient more confused and agitated -discontinue empiric cefepime as this may cause encephalopathy -B12--240 -TSH--3.171 -folate--8.3 -CT brain -Updated patient's daughter who confirms underlying cognitive impairment, but not to this point of aggressive behavior -prn valium   Diarrhea -Check stool for C. Difficile -Stool pathogen panel -No BM since admission -07/04/2023 CT AP--small hiatus hernia, diverticulosis, stable left adrenal adenoma, left adnexal cystic benign-appearing; no bowel wall thickening   UTI -UA>50 WBC -Unfortunately urine culture was not collected -Discontinue cefepime and start ceftriaxone as discussed above   Uncontrolled diabetes mellitus type 2 with hyperglycemia -NovoLog sliding scale -05/02/2023 hemoglobin A1c 8.5 -Holding metformin   Coronary artery disease -Patient had recent STEMI 12/2022 with PCI to the mid-proximal RCA -Continue Brilinta and aspirin -Continue statin -Continue metoprolol succinate -No chest pain presently   Essential hypertension -Continue metoprolol succinate   Major neurocognitive disorder -Patient is having hospital delirium -Holding gabapentin temporarily   Hypokalemia/hypomagnesemia -Repleted         Family Communication:   Daughter at bedside 7/13  Consultants:  none  Code Status:  DNR  DVT Prophylaxis:  [place] Lovenox   Procedures: As Listed in Progress Note Above  Antibiotics: Cefepime 7/12 Ceftriaxone 7/13>>        Subjective: Patient is confused and agitated and combative.  ROS not possible due to acute encephalopathy   Objective: Vitals:   07/05/23 0532 07/05/23 0535 07/05/23 1214 07/05/23 2221  BP: 101/60  (!) 119/50 (!) 151/65  Pulse:  62 64 64  Resp: 20  18 18  Temp: 98.4 F (36.9 C)  98.8 F (37.1 C) 98.5 F (36.9 C)  TempSrc:      SpO2: 99% 98% 100%  99%  Weight:      Height:        Intake/Output Summary (Last 24 hours) at 07/06/2023 1137 Last data filed at 07/06/2023 0900 Gross per 24 hour  Intake 1579.42 ml  Output 850 ml  Net 729.42 ml   Weight change:  Exam:  General:  Pt is alert, follows commands appropriately, not in acute distress HEENT: No icterus, No thrush, No neck mass, /AT Cardiovascular: RRR, S1/S2, no rubs, no gallops Respiratory: poor inspiratory effort, but CTA Abdomen: Soft/+BS, non tender, non distended, no guarding Extremities: No edema, No lymphangitis, No petechiae, No rashes, no synovitis   Data Reviewed: I have personally reviewed following labs and imaging studies Basic Metabolic Panel: Recent Labs  Lab 07/04/23 2018 07/05/23 0522 07/06/23 0458  NA 135 134* 136  K 4.0 3.2* 3.5  CL 105 104 103  CO2 19* 21* 24  GLUCOSE 148* 113* 183*  BUN 22 21 18   CREATININE 0.97 0.80 0.76  CALCIUM 9.6 8.6* 9.1  MG  --  1.8 1.6*  PHOS  --  3.3  --    Liver Function Tests: Recent Labs  Lab 07/04/23 2018 07/05/23 0522  AST 21 12*  ALT 13 10  ALKPHOS 94 71  BILITOT 0.6 0.6  PROT 6.6 5.4*  ALBUMIN 3.4* 2.8*   Recent Labs  Lab 07/04/23 2018  LIPASE 31   No results for input(s): "AMMONIA" in the last 168 hours. Coagulation Profile: No results for input(s): "INR", "PROTIME" in the last 168 hours. CBC: Recent Labs  Lab 07/04/23 2052 07/05/23 0522 07/06/23 0458  WBC 10.0 7.7 9.0  HGB 15.0 11.6* 13.0  HCT 47.2* 35.8* 40.9  MCV 88.6 88.2 89.1  PLT 194 171 199   Cardiac Enzymes: No results for input(s): "CKTOTAL", "CKMB", "CKMBINDEX", "TROPONINI" in the last 168 hours. BNP: Invalid input(s): "POCBNP" CBG: Recent Labs  Lab 07/04/23 1922 07/05/23 0726 07/05/23 1102 07/05/23 1610 07/05/23 2222  GLUCAP 145* 103* 183* 227* 269*   HbA1C: No results for input(s): "HGBA1C" in the last 72 hours. Urine analysis:    Component Value Date/Time   COLORURINE YELLOW 07/04/2023 2246    APPEARANCEUR CLOUDY (A) 07/04/2023 2246   APPEARANCEUR Clear 10/25/2021 1450   LABSPEC 1.013 07/04/2023 2246   PHURINE 8.0 07/04/2023 2246   GLUCOSEU NEGATIVE 07/04/2023 2246   HGBUR SMALL (A) 07/04/2023 2246   BILIRUBINUR NEGATIVE 07/04/2023 2246   BILIRUBINUR Negative 10/25/2021 1450   KETONESUR NEGATIVE 07/04/2023 2246   PROTEINUR 100 (A) 07/04/2023 2246   UROBILINOGEN 0.2 09/06/2009 0135   NITRITE POSITIVE (A) 07/04/2023 2246   LEUKOCYTESUR LARGE (A) 07/04/2023 2246   Sepsis Labs: @LABRCNTIP (procalcitonin:4,lacticidven:4) ) Recent Results (from the past 240 hour(s))  Blood culture (routine x 2)     Status: None (Preliminary result)   Collection Time: 07/04/23  9:38 PM   Specimen: BLOOD RIGHT ARM  Result Value Ref Range Status   Specimen Description BLOOD RIGHT ARM  Final   Special Requests   Final    BOTTLES DRAWN AEROBIC AND ANAEROBIC Blood Culture adequate volume   Culture   Final    NO GROWTH 2 DAYS Performed at [facility], [address], [place], [place] [address]    Report Status PENDING  Incomplete  Culture, blood (Routine X 2) w Reflex to ID Panel     Status:  None (Preliminary result)   Collection Time: 07/05/23 12:01 AM   Specimen: BLOOD  Result Value Ref Range Status   Specimen Description BLOOD BLOOD RIGHT ARM  Final   Special Requests   Final    BOTTLES DRAWN AEROBIC AND ANAEROBIC Blood Culture adequate volume   Culture   Final    NO GROWTH 1 DAY Performed at [facility], [address] [place], [place] [address]    Report Status PENDING  Incomplete     Scheduled Meds:  aspirin EC  81 mg Oral Daily   diazepam  5 mg Intramuscular Once   enoxaparin (LOVENOX) injection  40 mg Subcutaneous Daily   feeding supplement (GLUCERNA SHAKE)  237 mL Oral TID BM   insulin aspart  0-15 Units Subcutaneous TID WC   metoprolol succinate  25 mg Oral QHS   pantoprazole (PROTONIX) IV  40 mg Intravenous Q12H   simvastatin  20 mg Oral QPM   ticagrelor  90 mg Oral BID    Continuous Infusions:  cefTRIAXone (ROCEPHIN)  IV      Procedures/Studies: CT ABDOMEN PELVIS WO CONTRAST  Result Date: 07/04/2023 CLINICAL DATA:  Vomiting and altered mental status. EXAM: CT ABDOMEN AND PELVIS WITHOUT CONTRAST TECHNIQUE: Multidetector CT imaging of the abdomen and pelvis was performed following the standard protocol without IV contrast. RADIATION DOSE REDUCTION: This exam was performed according to the departmental dose-optimization program which includes automated exposure control, adjustment of the mA and/or kV according to patient size and/or use of iterative reconstruction technique. COMPARISON:  June 28, 2023 FINDINGS: Lower chest: No acute abnormality. Hepatobiliary: No focal liver abnormality is seen. No gallstones, gallbladder wall thickening, or biliary dilatation. Pancreas: Unremarkable. No pancreatic ductal dilatation or surrounding inflammatory changes. Spleen: Normal in size without focal abnormality. Adrenals/Urinary Tract: A stable 11 mm diameter low-attenuation left adrenal mass is seen (approximately 15.10 Hounsfield units). The right adrenal gland is unremarkable. Kidneys are normal in size, without renal calculi or focal lesions. Mild, stable right extrarenal pelvis prominence is noted. Bladder is unremarkable. Stomach/Bowel: There is a small hiatal hernia. The appendix is surgically absent. No evidence of bowel wall thickening, distention, or inflammatory changes. Noninflamed diverticula are seen along the junction of the descending and sigmoid colon. Vascular/Lymphatic: Aortic atherosclerosis. No enlarged abdominal or pelvic lymph nodes. Reproductive: Status post hysterectomy. The right adnexa is unremarkable. A 3.3 cm x 3.0 cm left adnexal cyst is seen. This is decreased in size when compared to the prior study. Other: No abdominal wall hernia or abnormality. No abdominopelvic ascites. Musculoskeletal: Stable degenerative and postoperative changes are seen within the  lower lumbar spine. IMPRESSION: 1. Small hiatal hernia. 2. Colonic diverticulosis. 3. Stable left adrenal adenoma. No follow-up imaging is recommended. This recommendation follows ACR consensus guidelines: Management of Incidental Adrenal Masses: A White Paper of the ACR Incidental Findings Committee. J Am Coll Radiol 2017;14:1038-1044. 4. Benign-appearing simple left adnexal cyst. No follow-up imaging is recommended. This recommendation follows ACR consensus guidelines: White Paper of the ACR Incidental Findings Committee II on Adnexal Findings. J Am Coll Radiol 2013:10:675-681. 5. Aortic atherosclerosis. Aortic Atherosclerosis (ICD10-I70.0). Electronically Signed   By: Aram Candela M.D.   On: 07/04/2023 22:19   CT ABDOMEN PELVIS W CONTRAST  Result Date: 06/28/2023 CLINICAL DATA:  Lower abdominal pain, diarrhea. EXAM: CT ABDOMEN AND PELVIS WITH CONTRAST TECHNIQUE: Multidetector CT imaging of the abdomen and pelvis was performed using the standard protocol following bolus administration of intravenous contrast. RADIATION DOSE REDUCTION: This exam was performed  according to the departmental dose-optimization program which includes automated exposure control, adjustment of the mA and/or kV according to patient size and/or use of iterative reconstruction technique. CONTRAST:  OMNIPAQUE IOHEXOL 300 MG/ML  SOLN COMPARISON:  05/01/2023 FINDINGS: Lower chest: No acute abnormality. Hepatobiliary: No focal liver lesions. No gallstones, gallbladder wall thickening or inflammation. No significant biliary ductal dilatation. Pancreas: Unremarkable. No pancreatic ductal dilatation or surrounding inflammatory changes. Spleen: Normal in size without focal abnormality. Adrenals/Urinary Tract: Normal right adrenal gland. Left adrenal nodule measures 1.1 cm. This is been present since 09/06/2009. Findings compatible with a benign adenoma. No follow-up imaging advised. No nephrolithiasis or hydronephrosis. Urinary bladder  appears within normal limits. Stomach/Bowel: The stomach appears nondistended. Mild wall thickening involving the distal esophagus and stomach noted. The appendix is not visualized and may be surgically absent. No pathologic dilatation of the large or small bowel loops to suggest obstruction. Terminal ileum is visualized and is normal without wall thickening or inflammation. Mild diffuse mucosal enhancement and equivocal wall thickening involving the colon, which is most notable at the level of the sigmoid colon. No significant surrounding inflammatory fat stranding. Moderate retained stool identified within the rectum. Vascular/Lymphatic: Aortic atherosclerosis. No aneurysm. No signs of abdominopelvic adenopathy. Reproductive: Status post hysterectomy. Again seen is a triangular-shaped fluid density structure along the broad ligament which measures 4.1 x 3.7 cm, image 65/2. This is unchanged from the previous exam and is favored to represent a benign process. Other: No significant free fluid within the abdomen or pelvis. No signs of pneumoperitoneum. Musculoskeletal: No acute or suspicious osseous findings. Lumbar spondylosis. Status post right lateral plate and screw fixation with interbody fusion at L4-5. Marked degenerative disc disease noted at L2-3, L3-4 and L5-S1. IMPRESSION: 1. Mild diffuse mucosal enhancement and equivocal wall thickening involving the colon, most notable at the level of the sigmoid colon. Findings may reflect a mild colitis. 2. Mild wall thickening involving the distal esophagus and stomach, which may reflect esophagitis/gastritis. Clinical correlation advised. 3. Moderate retained stool within the rectum. 4. Stable fluid density, triangular-shaped structure along the broad ligament of the left hemipelvis status post hysterectomy. This is favored to represent a benign process. 5. Aortic Atherosclerosis (ICD10-I70.0). Electronically Signed   By: Signa Kell M.D.   On: 06/28/2023 10:32     Catarina Hartshorn, DO  [facility]  If 7PM-7AM, please contact night-coverage [email or web address] Password [facility] 07/06/2023, 11:37 AM   LOS: 1 day

## 2023-07-06 NOTE — Progress Notes (Signed)
Patient removed her IV and after two attempts I was unable to secure another IV site. Charge nurse was notified and she was also unsuccessful in starting a new IV for the patient. Tinnie Gens from ED contacted to use ultrasound for patient IV, patient refused IV and he was unable to attempt.

## 2023-07-07 ENCOUNTER — Inpatient Hospital Stay (HOSPITAL_COMMUNITY): Payer: Medicare Other

## 2023-07-07 DIAGNOSIS — E1165 Type 2 diabetes mellitus with hyperglycemia: Secondary | ICD-10-CM | POA: Diagnosis not present

## 2023-07-07 DIAGNOSIS — N3001 Acute cystitis with hematuria: Secondary | ICD-10-CM | POA: Diagnosis not present

## 2023-07-07 DIAGNOSIS — I10 Essential (primary) hypertension: Secondary | ICD-10-CM | POA: Diagnosis not present

## 2023-07-07 DIAGNOSIS — G9341 Metabolic encephalopathy: Secondary | ICD-10-CM | POA: Diagnosis not present

## 2023-07-07 LAB — GLUCOSE, CAPILLARY
Glucose-Capillary: 171 mg/dL — ABNORMAL HIGH (ref 70–99)
Glucose-Capillary: 311 mg/dL — ABNORMAL HIGH (ref 70–99)
Glucose-Capillary: 191 mg/dL — ABNORMAL HIGH (ref 70–99)
Glucose-Capillary: 178 mg/dL — ABNORMAL HIGH (ref 70–99)

## 2023-07-07 MED ORDER — METOPROLOL SUCCINATE ER 50 MG PO TB24
50.0000 mg | ORAL_TABLET | Freq: Every day | ORAL | Status: DC
  Administered 2023-07-07: 50 mg via ORAL
  Filled 2023-07-07: qty 1

## 2023-07-07 MED ORDER — MAGNESIUM SULFATE 2 GM/50ML IV SOLN
2.0000 g | Freq: Once | INTRAVENOUS | Status: AC
  Administered 2023-07-07: 2 g via INTRAVENOUS
  Filled 2023-07-07: qty 50

## 2023-07-07 NOTE — Progress Notes (Signed)
PROGRESS NOTE  EMERA SPATH ZHY:[id number] DOB: 27-Jul-1929 DOA: 07/04/2023 PCP: Assunta Found, MD  Brief History:  89 year old female with a history of dementia, hypertension, diabetes mellitus type 2, hyperlipidemia, stroke, coronary artery disease status post STEMI 12/2022, and constipation presenting with nausea, vomiting, and altered mental status.  The patient has had numerous similar admissions in the recent past.  Notably, the patient had recent hospital admission from 06/28/2023 to 06/29/2023 at which time the patient was treated for fecal impaction.  She was manually disimpacted and discharged home with instructions to increase MiraLAX as needed if no BM in 3 days.  Another recent hospital admission from 05/01/2023 to 05/05/2023 was noted for UTI for which she was discharged home with ciprofloxacin after treatment with ceftriaxone in the hospital.  She also had a hospital admission from 02/10/2023 to 02/13/2023 secondary to hypoglycemia and a Proteus UTI. Upon further questioning, the patient is a poor historian, and she has poor insight regarding her medications. Patient's daughter states that the patient has had at least 5-6 loose stools on a daily basis since discharge from the hospital on 06/29/2023.  There is no hematochezia or melena.  There is no hematemesis.  Her appetite has been worse than usual.  There is been no coughing, hemoptysis, chest pain, shortness breath, abdominal pain noted.  The patient does require assistance with transfers.  In the ED, the patient was febrile up to 101.6 F.  She was hemodynamically stable with oxygen saturation 99% room air.  WBC 10.0, hemoglobin 15.0, platelets 171.  Sodium 135, potassium 4.0, bicarbonate 19, serum creatinine 0.97.  LFTs were unremarkable.  CT of the abdomen and pelvis showed a small hiatus hernia, diverticulosis, stable left adrenal adenoma, and a benign-appearing left adnexal cyst.  The patient was started on cefepime initially  and IV fluids.   Assessment/Plan: Acute metabolic encephalopathy -Secondary to UTI and dehydration -Continue IV fluids>>saline lock -7/13--patient more confused and agitated -discontinue empiric cefepime as this may cause encephalopathy -B12--240 -TSH--3.171 -folate--8.3 -CT brain--neg -Updated patient's daughter who confirms underlying cognitive impairment, but not to this point of aggressive behavior -prn valium/haldol -7/14--less agitated. A&Ox 2   Diarrhea -Check stool for C. Difficile -Stool pathogen panel -No BM since admission -07/04/2023 CT AP--small hiatus hernia, diverticulosis, stable left adrenal adenoma, left adnexal cystic benign-appearing; no bowel wall thickening   UTI -UA>50 WBC -Unfortunately urine culture was not collected -Discontinue cefepime and start ceftriaxone as discussed above -7/14--continue ceftriaxone   Uncontrolled diabetes mellitus type 2 with hyperglycemia -NovoLog sliding scale -05/02/2023 hemoglobin A1c 8.5 -Holding metformin   Coronary artery disease -Patient had recent STEMI 12/2022 with PCI to the mid-proximal RCA -Continue Brilinta and aspirin -Continue statin -Continue metoprolol succinate -No chest pain presently   Essential hypertension -Continue metoprolol succinate   Major neurocognitive disorder -Patient is having hospital delirium -Holding gabapentin temporarily   Hypokalemia/hypomagnesemia -Repleted                 Family Communication:   Daughter  7/14   Consultants:  none   Code Status:  DNR   DVT Prophylaxis:  [place] Lovenox     Procedures: As Listed in Progress Note Above   Antibiotics: Cefepime 7/12 Ceftriaxone 7/13>>          Subjective: Patient denies fevers, chills, headache, chest pain, dyspnea, nausea, vomiting, diarrhea, abdominal pain, dysuria, hematuria, hematochezia, and melena.   Objective: Vitals:   07/06/23 2056 07/07/23 0529 07/07/23  1414 07/07/23 1536  BP: (!) 162/84 (!)  158/74 (!) 165/68 (!) 162/80  Pulse: 83 83 92 92  Resp: 20 18 18 18   Temp: 97.7 F (36.5 C) 97.7 F (36.5 C) (!) 97.4 F (36.3 C) (!) 97.3 F (36.3 C)  TempSrc: Oral   Oral  SpO2: 99% 100% 99% 97%  Weight:      Height:        Intake/Output Summary (Last 24 hours) at 07/07/2023 1807 Last data filed at 07/07/2023 1740 Gross per 24 hour  Intake 509.12 ml  Output --  Net 509.12 ml   Weight change:  Exam:  General:  Pt is alert, follows commands appropriately, not in acute distress HEENT: No icterus, No thrush, No neck mass, [place]/AT Cardiovascular: RRR, S1/S2, no rubs, no gallops Respiratory: bibasilar crackles.  No wheeze Abdomen: Soft/+BS, non tender, non distended, no guarding Extremities: No edema, No lymphangitis, No petechiae, No rashes, no synovitis   Data Reviewed: I have personally reviewed following labs and imaging studies Basic Metabolic Panel: Recent Labs  Lab 07/04/23 2018 07/05/23 0522 07/06/23 0458  NA 135 134* 136  K 4.0 3.2* 3.5  CL 105 104 103  CO2 19* 21* 24  GLUCOSE 148* 113* 183*  BUN 22 21 18   CREATININE 0.97 0.80 0.76  CALCIUM 9.6 8.6* 9.1  MG  --  1.8 1.6*  PHOS  --  3.3  --    Liver Function Tests: Recent Labs  Lab 07/04/23 2018 07/05/23 0522  AST 21 12*  ALT 13 10  ALKPHOS 94 71  BILITOT 0.6 0.6  PROT 6.6 5.4*  ALBUMIN 3.4* 2.8*   Recent Labs  Lab 07/04/23 2018  LIPASE 31   No results for input(s): "AMMONIA" in the last 168 hours. Coagulation Profile: No results for input(s): "INR", "PROTIME" in the last 168 hours. CBC: Recent Labs  Lab 07/04/23 2052 07/05/23 0522 07/06/23 0458  WBC 10.0 7.7 9.0  HGB 15.0 11.6* 13.0  HCT 47.2* 35.8* 40.9  MCV 88.6 88.2 89.1  PLT 194 171 199   Cardiac Enzymes: No results for input(s): "CKTOTAL", "CKMB", "CKMBINDEX", "TROPONINI" in the last 168 hours. BNP: Invalid input(s): "POCBNP" CBG: Recent Labs  Lab 07/06/23 1157 07/06/23 2051 07/07/23 0732 07/07/23 1155 07/07/23 1536   GLUCAP 193* 140* 171* 311* 191*   HbA1C: No results for input(s): "HGBA1C" in the last 72 hours. Urine analysis:    Component Value Date/Time   COLORURINE YELLOW 07/04/2023 2246   APPEARANCEUR CLOUDY (A) 07/04/2023 2246   APPEARANCEUR Clear 10/25/2021 1450   LABSPEC 1.013 07/04/2023 2246   PHURINE 8.0 07/04/2023 2246   GLUCOSEU NEGATIVE 07/04/2023 2246   HGBUR SMALL (A) 07/04/2023 2246   BILIRUBINUR NEGATIVE 07/04/2023 2246   BILIRUBINUR Negative 10/25/2021 1450   KETONESUR NEGATIVE 07/04/2023 2246   PROTEINUR 100 (A) 07/04/2023 2246   UROBILINOGEN 0.2 09/06/2009 0135   NITRITE POSITIVE (A) 07/04/2023 2246   LEUKOCYTESUR LARGE (A) 07/04/2023 2246   Sepsis Labs: @LABRCNTIP (procalcitonin:4,lacticidven:4) ) Recent Results (from the past 240 hour(s))  Blood culture (routine x 2)     Status: None (Preliminary result)   Collection Time: 07/04/23  9:38 PM   Specimen: BLOOD RIGHT ARM  Result Value Ref Range Status   Specimen Description BLOOD RIGHT ARM  Final   Special Requests   Final    BOTTLES DRAWN AEROBIC AND ANAEROBIC Blood Culture adequate volume   Culture   Final    NO GROWTH 3 DAYS Performed at [facility],  [address], [place], [place] [address]    Report Status PENDING  Incomplete  Culture, blood (Routine X 2) w Reflex to ID Panel     Status: None (Preliminary result)   Collection Time: 07/05/23 12:01 AM   Specimen: BLOOD  Result Value Ref Range Status   Specimen Description BLOOD BLOOD RIGHT ARM  Final   Special Requests   Final    BOTTLES DRAWN AEROBIC AND ANAEROBIC Blood Culture adequate volume   Culture   Final    NO GROWTH 2 DAYS Performed at [facility], [address] [place], [place] [address]    Report Status PENDING  Incomplete     Scheduled Meds:  aspirin EC  81 mg Oral Daily   enoxaparin (LOVENOX) injection  40 mg Subcutaneous Daily   feeding supplement (GLUCERNA SHAKE)  237 mL Oral TID BM   insulin aspart  0-15 Units Subcutaneous TID WC    metoprolol succinate  25 mg Oral QHS   pantoprazole (PROTONIX) IV  40 mg Intravenous Q12H   simvastatin  20 mg Oral QPM   ticagrelor  90 mg Oral BID   Continuous Infusions:  cefTRIAXone (ROCEPHIN)  IV 1 g (07/07/23 1723)    Procedures/Studies: CT HEAD WO CONTRAST ( )  Result Date: 07/07/2023 CLINICAL DATA:  89 year old female with altered mental status. Encephalopathy. UTI, dehydration. EXAM: CT HEAD WITHOUT CONTRAST TECHNIQUE: Contiguous axial images were obtained from the base of the skull through the vertex without intravenous contrast. RADIATION DOSE REDUCTION: This exam was performed according to the departmental dose-optimization program which includes automated exposure control, adjustment of the mA and/or kV according to patient size and/or use of iterative reconstruction technique. COMPARISON:  Head CT 05/01/2023. FINDINGS: Study is mildly degraded by motion artifact despite repeated imaging attempts. Brain: Stable cerebral volume since May. No midline shift, ventriculomegaly, mass effect, evidence of mass lesion, intracranial hemorrhage or evidence of cortically based acute infarction. Stable gray-white matter differentiation throughout the brain. Vascular: Calcified atherosclerosis at the skull base. No suspicious intracranial vascular hyperdensity. Skull: No acute osseous abnormality identified. Sinuses/Orbits: Visualized paranasal sinuses and mastoids are stable and well aerated. Other: No acute orbit or scalp soft tissue finding. Calcified scalp vessel atherosclerosis. IMPRESSION: Mildly motion degraded exam.  No acute intracranial abnormality. Electronically Signed   By: Odessa Fleming M.D.   On: 07/07/2023 13:05   CT ABDOMEN PELVIS WO CONTRAST  Result Date: 07/04/2023 CLINICAL DATA:  Vomiting and altered mental status. EXAM: CT ABDOMEN AND PELVIS WITHOUT CONTRAST TECHNIQUE: Multidetector CT imaging of the abdomen and pelvis was performed following the standard protocol without IV contrast.  RADIATION DOSE REDUCTION: This exam was performed according to the departmental dose-optimization program which includes automated exposure control, adjustment of the mA and/or kV according to patient size and/or use of iterative reconstruction technique. COMPARISON:  June 28, 2023 FINDINGS: Lower chest: No acute abnormality. Hepatobiliary: No focal liver abnormality is seen. No gallstones, gallbladder wall thickening, or biliary dilatation. Pancreas: Unremarkable. No pancreatic ductal dilatation or surrounding inflammatory changes. Spleen: Normal in size without focal abnormality. Adrenals/Urinary Tract: A stable 11 mm diameter low-attenuation left adrenal mass is seen (approximately 15.10 Hounsfield units). The right adrenal gland is unremarkable. Kidneys are normal in size, without renal calculi or focal lesions. Mild, stable right extrarenal pelvis prominence is noted. Bladder is unremarkable. Stomach/Bowel: There is a small hiatal hernia. The appendix is surgically absent. No evidence of bowel wall thickening, distention, or inflammatory changes. Noninflamed diverticula are seen along the junction of  the descending and sigmoid colon. Vascular/Lymphatic: Aortic atherosclerosis. No enlarged abdominal or pelvic lymph nodes. Reproductive: Status post hysterectomy. The right adnexa is unremarkable. A 3.3 cm x 3.0 cm left adnexal cyst is seen. This is decreased in size when compared to the prior study. Other: No abdominal wall hernia or abnormality. No abdominopelvic ascites. Musculoskeletal: Stable degenerative and postoperative changes are seen within the lower lumbar spine. IMPRESSION: 1. Small hiatal hernia. 2. Colonic diverticulosis. 3. Stable left adrenal adenoma. No follow-up imaging is recommended. This recommendation follows ACR consensus guidelines: Management of Incidental Adrenal Masses: A White Paper of the ACR Incidental Findings Committee. J Am Coll Radiol 2017;14:1038-1044. 4. Benign-appearing simple  left adnexal cyst. No follow-up imaging is recommended. This recommendation follows ACR consensus guidelines: White Paper of the ACR Incidental Findings Committee II on Adnexal Findings. J Am Coll Radiol 2013:10:675-681. 5. Aortic atherosclerosis. Aortic Atherosclerosis (ICD10-I70.0). Electronically Signed   By: Aram Candela M.D.   On: 07/04/2023 22:19   CT ABDOMEN PELVIS W CONTRAST  Result Date: 06/28/2023 CLINICAL DATA:  Lower abdominal pain, diarrhea. EXAM: CT ABDOMEN AND PELVIS WITH CONTRAST TECHNIQUE: Multidetector CT imaging of the abdomen and pelvis was performed using the standard protocol following bolus administration of intravenous contrast. RADIATION DOSE REDUCTION: This exam was performed according to the departmental dose-optimization program which includes automated exposure control, adjustment of the mA and/or kV according to patient size and/or use of iterative reconstruction technique. CONTRAST:  OMNIPAQUE IOHEXOL 300 MG/ML  SOLN COMPARISON:  05/01/2023 FINDINGS: Lower chest: No acute abnormality. Hepatobiliary: No focal liver lesions. No gallstones, gallbladder wall thickening or inflammation. No significant biliary ductal dilatation. Pancreas: Unremarkable. No pancreatic ductal dilatation or surrounding inflammatory changes. Spleen: Normal in size without focal abnormality. Adrenals/Urinary Tract: Normal right adrenal gland. Left adrenal nodule measures 1.1 cm. This is been present since 09/06/2009. Findings compatible with a benign adenoma. No follow-up imaging advised. No nephrolithiasis or hydronephrosis. Urinary bladder appears within normal limits. Stomach/Bowel: The stomach appears nondistended. Mild wall thickening involving the distal esophagus and stomach noted. The appendix is not visualized and may be surgically absent. No pathologic dilatation of the large or small bowel loops to suggest obstruction. Terminal ileum is visualized and is normal without wall thickening or  inflammation. Mild diffuse mucosal enhancement and equivocal wall thickening involving the colon, which is most notable at the level of the sigmoid colon. No significant surrounding inflammatory fat stranding. Moderate retained stool identified within the rectum. Vascular/Lymphatic: Aortic atherosclerosis. No aneurysm. No signs of abdominopelvic adenopathy. Reproductive: Status post hysterectomy. Again seen is a triangular-shaped fluid density structure along the broad ligament which measures 4.1 x 3.7 cm, image 65/2. This is unchanged from the previous exam and is favored to represent a benign process. Other: No significant free fluid within the abdomen or pelvis. No signs of pneumoperitoneum. Musculoskeletal: No acute or suspicious osseous findings. Lumbar spondylosis. Status post right lateral plate and screw fixation with interbody fusion at L4-5. Marked degenerative disc disease noted at L2-3, L3-4 and L5-S1. IMPRESSION: 1. Mild diffuse mucosal enhancement and equivocal wall thickening involving the colon, most notable at the level of the sigmoid colon. Findings may reflect a mild colitis. 2. Mild wall thickening involving the distal esophagus and stomach, which may reflect esophagitis/gastritis. Clinical correlation advised. 3. Moderate retained stool within the rectum. 4. Stable fluid density, triangular-shaped structure along the broad ligament of the left hemipelvis status post hysterectomy. This is favored to represent a benign process. 5. Aortic Atherosclerosis (ICD10-I70.0). Electronically  Signed   By: Signa Kell M.D.   On: 06/28/2023 10:32    Catarina Hartshorn, DO  [facility]  If 7PM-7AM, please contact night-coverage [email or web address] Password [email or web address] 07/07/2023, 6:07 PM   LOS: 2 days

## 2023-07-08 DIAGNOSIS — N3000 Acute cystitis without hematuria: Secondary | ICD-10-CM | POA: Diagnosis not present

## 2023-07-08 DIAGNOSIS — G9341 Metabolic encephalopathy: Secondary | ICD-10-CM | POA: Diagnosis not present

## 2023-07-08 DIAGNOSIS — E1165 Type 2 diabetes mellitus with hyperglycemia: Secondary | ICD-10-CM | POA: Diagnosis not present

## 2023-07-08 LAB — CBC
HCT: 40.9 % (ref 36.0–46.0)
Hemoglobin: 13.2 g/dL (ref 12.0–15.0)
MCH: 28.3 pg (ref 26.0–34.0)
MCHC: 32.3 g/dL (ref 30.0–36.0)
MCV: 87.8 fL (ref 80.0–100.0)
Platelets: 219 10*3/uL (ref 150–400)
RBC: 4.66 MIL/uL (ref 3.87–5.11)
RDW: 15.1 % (ref 11.5–15.5)
WBC: 7.9 10*3/uL (ref 4.0–10.5)
nRBC: 0 % (ref 0.0–0.2)

## 2023-07-08 LAB — BASIC METABOLIC PANEL
Anion gap: 9 (ref 5–15)
BUN: 14 mg/dL (ref 8–23)
CO2: 23 mmol/L (ref 22–32)
Calcium: 9 mg/dL (ref 8.9–10.3)
Chloride: 106 mmol/L (ref 98–111)
Creatinine, Ser: 0.67 mg/dL (ref 0.44–1.00)
GFR, Estimated: >60 mL/min (ref >60)
Glucose, Bld: 197 mg/dL — ABNORMAL HIGH (ref 70–99)
Potassium: 3.1 mmol/L — ABNORMAL LOW (ref 3.5–5.1)
Sodium: 138 mmol/L (ref 135–145)

## 2023-07-08 LAB — CULTURE, BLOOD (ROUTINE X 2)
Culture: NO GROWTH
Special Requests: ADEQUATE
Special Requests: ADEQUATE

## 2023-07-08 LAB — MAGNESIUM: Magnesium: 2.2 mg/dL (ref 1.7–2.4)

## 2023-07-08 LAB — GLUCOSE, CAPILLARY
Glucose-Capillary: 187 mg/dL — ABNORMAL HIGH (ref 70–99)
Glucose-Capillary: 410 mg/dL — ABNORMAL HIGH (ref 70–99)
Glucose-Capillary: 414 mg/dL — ABNORMAL HIGH (ref 70–99)

## 2023-07-08 MED ORDER — POTASSIUM CHLORIDE CRYS ER 20 MEQ PO TBCR
40.0000 meq | EXTENDED_RELEASE_TABLET | Freq: Once | ORAL | Status: AC
  Administered 2023-07-08: 40 meq via ORAL
  Filled 2023-07-08: qty 2

## 2023-07-08 MED ORDER — INSULIN ASPART 100 UNIT/ML IJ SOLN
20.0000 [IU] | Freq: Once | INTRAMUSCULAR | Status: AC
  Administered 2023-07-08: 20 [IU] via SUBCUTANEOUS

## 2023-07-08 MED ORDER — CEFDINIR 300 MG PO CAPS: 300.0000 mg | ORAL_CAPSULE | Freq: Two times a day (BID) | ORAL | 0 refills | Status: DC

## 2023-07-08 MED ORDER — CEFDINIR 300 MG PO CAPS
300.0000 mg | ORAL_CAPSULE | Freq: Two times a day (BID) | ORAL | Status: DC
  Administered 2023-07-08: 300 mg via ORAL
  Filled 2023-07-08: qty 1

## 2023-07-08 MED ORDER — PANTOPRAZOLE SODIUM 40 MG PO TBEC: 40.0000 mg | DELAYED_RELEASE_TABLET | Freq: Two times a day (BID) | ORAL | Status: DC

## 2023-07-08 NOTE — Plan of Care (Signed)
  Problem: Acute Rehab PT Goals(only PT should resolve) Goal: Pt Will Go Supine/Side To Sit Outcome: Progressing Flowsheets (Taken 07/08/2023 0938) Pt will go Supine/Side to Sit:  with min guard assist  with minimal assist Goal: Patient Will Transfer Sit To/From Stand Outcome: Progressing Flowsheets (Taken 07/08/2023 [id number]) Patient will transfer sit to/from stand: with minimal assist Goal: Pt Will Transfer Bed To Chair/Chair To Bed Outcome: Progressing Flowsheets (Taken 07/08/2023 0938) Pt will Transfer Bed to Chair/Chair to Bed:  with min assist  with mod assist Goal: Pt Will Ambulate Outcome: Progressing   9:39 AM, 07/08/23 Ocie Bob, MPT Physical Therapist with [facility] [phone] office [phone] mobile phone

## 2023-07-08 NOTE — TOC Progression Note (Signed)
Transition of Care [facility]) - Progression Note    Patient Details  Name: [name] MRN: [id number] Date of Birth: 16-Apr-1929  Transition of Care [facility]) CM/SW Contact  Karn Cassis, [place] Phone Number: 07/08/2023, 11:09 AM  Clinical Narrative:  Discussed PT eval and d/c plan with pt's daughter, Lupita Leash. She will take pt home. Pt is active with [facility]. Offered HHPT. Lupita Leash is going to talk to pt/family and will update LCSW.      Expected Discharge Plan: Long Term Acute Care (LTAC) Barriers to Discharge: Continued Medical Work up  Expected Discharge Plan and Services In-house Referral: Clinical Social Work     Living arrangements for the past 2 months: Single Family Home (With spouse)                                       Social Determinants of Health (SDOH) Interventions SDOH Screenings   Food Insecurity: No Food Insecurity (02/10/2023)  Housing: Low Risk  (02/10/2023)  Transportation Needs: No Transportation Needs (02/10/2023)  Utilities: Not At Risk (02/10/2023)  Physical Activity: Inactive (02/08/2019)  Social Connections: Unknown (02/08/2019)  Stress: No Stress Concern Present (02/08/2019)  Tobacco Use: Low Risk  (07/04/2023)    Readmission Risk Interventions    07/06/2023    3:49 PM 02/12/2023   11:26 AM 01/04/2023   11:03 AM  Readmission Risk Prevention Plan  Transportation Screening Complete Complete Complete  PCP or Specialist Appt within 3-5 Days Complete    HRI or Home Care Consult Complete Complete   Social Work Consult for Recovery Care Planning/Counseling Complete Complete Complete  Palliative Care Screening Not Applicable Not Applicable   Medication Review Oceanographer) Complete Complete

## 2023-07-08 NOTE — Progress Notes (Signed)
PHARMACIST - PHYSICIAN COMMUNICATION CONCERNING: IV to Oral Route Change Policy  RECOMMENDATION: This patient is receiving pantoprazole intravenously. Based on criteria approved by the Pharmacy and Therapeutics Committee, the intravenous medication(s) is/are being converted to the equivalent dose of an oral formulation.   DESCRIPTION: These criteria include: The patient is eating (either orally or via tube) and/or has been taking other orally administered medications for at least 24 hours. The patient has no evidence of active gastrointestinal bleeding or malabsorptive GI state (gastrectomy; short bowel; patient on TNA or NPO).  If you have questions about this conversion, please contact the Pharmacy Department:  []   [phone] )  [facility] [x]   [phone] )  [facility] []   [phone] )  [facility] []   [phone] )  [facility]   Will M. Dareen Piano, PharmD PGY-1 Pharmacy Resident 07/08/2023 11:25 AM

## 2023-07-08 NOTE — Progress Notes (Signed)
PROGRESS NOTE  [name] VWU:[id number] DOB: 1929/09/23 DOA: 07/04/2023 PCP: Assunta Found, MD  Brief History:  89 year old female with a history of dementia, hypertension, diabetes mellitus type 2, hyperlipidemia, stroke, coronary artery disease status post STEMI 12/2022, and constipation presenting with nausea, vomiting, and altered mental status.  The patient has had numerous similar admissions in the recent past.  Notably, the patient had recent hospital admission from 06/28/2023 to 06/29/2023 at which time the patient was treated for fecal impaction.  She was manually disimpacted and discharged home with instructions to increase MiraLAX as needed if no BM in 3 days.  Another recent hospital admission from 05/01/2023 to 05/05/2023 was noted for UTI for which she was discharged home with ciprofloxacin after treatment with ceftriaxone in the hospital.  She also had a hospital admission from 02/10/2023 to 02/13/2023 secondary to hypoglycemia and a Proteus UTI. Upon further questioning, the patient is a poor historian, and she has poor insight regarding her medications. Patient's daughter states that the patient has had at least 5-6 loose stools on a daily basis since discharge from the hospital on 06/29/2023.  There is no hematochezia or melena.  There is no hematemesis.  Her appetite has been worse than usual.  There is been no coughing, hemoptysis, chest pain, shortness breath, abdominal pain noted.  The patient does require assistance with transfers.  In the ED, the patient was febrile up to 101.6 F.  She was hemodynamically stable with oxygen saturation 99% room air.  WBC 10.0, hemoglobin 15.0, platelets 171.  Sodium 135, potassium 4.0, bicarbonate 19, serum creatinine 0.97.  LFTs were unremarkable.  CT of the abdomen and pelvis showed a small hiatus hernia, diverticulosis, stable left adrenal adenoma, and a benign-appearing left adnexal cyst.  The patient was started on cefepime initially  and IV fluids.   Assessment/Plan:  Acute metabolic encephalopathy -Secondary to UTI and dehydration -Continue IV fluids>>saline lock -7/13--patient more confused and agitated -discontinue empiric cefepime as this may cause encephalopathy -B12--240 -TSH--3.171 -folate--8.3 -CT brain--neg -Updated patient's daughter who confirms underlying cognitive impairment, but not to this point of aggressive behavior -prn valium/haldol -7/14--less agitated. A&Ox 2 -7/15--less agitated. A&O x 2--near baseline per DTR   Diarrhea -Check stool for C. Difficile -Stool pathogen panel -No BM since admission -07/04/2023 CT AP--small hiatus hernia, diverticulosis, stable left adrenal adenoma, left adnexal cystic benign-appearing; no bowel wall thickening   UTI -UA>50 WBC -Unfortunately urine culture was not collected -Discontinue cefepime and start ceftriaxone as discussed above -7/14--continue ceftriaxone   Uncontrolled diabetes mellitus type 2 with hyperglycemia -NovoLog sliding scale -05/02/2023 hemoglobin A1c 8.5 -Holding metformin   Coronary artery disease -Patient had recent STEMI 12/2022 with PCI to the mid-proximal RCA -Continue Brilinta and aspirin -Continue statin -Continue metoprolol succinate -No chest pain presently   Essential hypertension -Continue metoprolol succinate   Major neurocognitive disorder -Patient is having hospital delirium -Holding gabapentin temporarily   Hypokalemia/hypomagnesemia -Repleted                 Family Communication:   Daughter  7/15   Consultants:  none   Code Status:  DNR   DVT Prophylaxis:  [place] Lovenox     Procedures: As Listed in Progress Note Above   Antibiotics: Cefepime 7/12 Ceftriaxone 7/13>>           Subjective: Patient denies fevers, chills, headache, chest pain, dyspnea, nausea, vomiting, diarrhea, abdominal pain, dysuria, hematuria, .   Objective:  Vitals:   07/07/23 1536 07/07/23 2049 07/07/23 2259  07/08/23 0612  BP: (!) 162/80 (!) 152/80 (!) 157/90 (!) 156/70  Pulse: 92 93 98 79  Resp: 18 16 16 17   Temp: (!) 97.3 F (36.3 C) 97.8 F (36.6 C) 97.7 F (36.5 C) (!) 97.5 F (36.4 C)  TempSrc: Oral  Axillary Oral  SpO2: 97% 99% 98% 96%  Weight:      Height:        Intake/Output Summary (Last 24 hours) at 07/08/2023 0735 Last data filed at 07/08/2023 0339 Gross per 24 hour  Intake 749.12 ml  Output 1100 ml  Net -350.88 ml   Weight change:  Exam:  General:  Pt is alert, follows commands appropriately, not in acute distress HEENT: No icterus, No thrush, No neck mass, [place]/AT Cardiovascular: RRR, S1/S2, no rubs, no gallops Respiratory: bibasilar crackles.  No wheeze Abdomen: Soft/+BS, non tender, non distended, no guarding Extremities: No edema, No lymphangitis, No petechiae, No rashes, no synovitis   Data Reviewed: I have personally reviewed following labs and imaging studies Basic Metabolic Panel: Recent Labs  Lab 07/04/23 2018 07/05/23 0522 07/06/23 0458 07/08/23 0526  NA 135 134* 136 138  K 4.0 3.2* 3.5 3.1*  CL 105 104 103 106  CO2 19* 21* 24 23  GLUCOSE 148* 113* 183* 197*  BUN 22 21 18 14   CREATININE 0.97 0.80 0.76 0.67  CALCIUM 9.6 8.6* 9.1 9.0  MG  --  1.8 1.6* 2.2  PHOS  --  3.3  --   --    Liver Function Tests: Recent Labs  Lab 07/04/23 2018 07/05/23 0522  AST 21 12*  ALT 13 10  ALKPHOS 94 71  BILITOT 0.6 0.6  PROT 6.6 5.4*  ALBUMIN 3.4* 2.8*   Recent Labs  Lab 07/04/23 2018  LIPASE 31   No results for input(s): "AMMONIA" in the last 168 hours. Coagulation Profile: No results for input(s): "INR", "PROTIME" in the last 168 hours. CBC: Recent Labs  Lab 07/04/23 2052 07/05/23 0522 07/06/23 0458 07/08/23 0526  WBC 10.0 7.7 9.0 7.9  HGB 15.0 11.6* 13.0 13.2  HCT 47.2* 35.8* 40.9 40.9  MCV 88.6 88.2 89.1 87.8  PLT 194 171 199 219   Cardiac Enzymes: No results for input(s): "CKTOTAL", "CKMB", "CKMBINDEX", "TROPONINI" in the last 168  hours. BNP: Invalid input(s): "POCBNP" CBG: Recent Labs  Lab 07/06/23 2051 07/07/23 0732 07/07/23 1155 07/07/23 1536 07/07/23 2143  GLUCAP 140* 171* 311* 191* 178*   HbA1C: No results for input(s): "HGBA1C" in the last 72 hours. Urine analysis:    Component Value Date/Time   COLORURINE YELLOW 07/04/2023 2246   APPEARANCEUR CLOUDY (A) 07/04/2023 2246   APPEARANCEUR Clear 10/25/2021 1450   LABSPEC 1.013 07/04/2023 2246   PHURINE 8.0 07/04/2023 2246   GLUCOSEU NEGATIVE 07/04/2023 2246   HGBUR SMALL (A) 07/04/2023 2246   BILIRUBINUR NEGATIVE 07/04/2023 2246   BILIRUBINUR Negative 10/25/2021 1450   KETONESUR NEGATIVE 07/04/2023 2246   PROTEINUR 100 (A) 07/04/2023 2246   UROBILINOGEN 0.2 09/06/2009 0135   NITRITE POSITIVE (A) 07/04/2023 2246   LEUKOCYTESUR LARGE (A) 07/04/2023 2246   Sepsis Labs: @LABRCNTIP (procalcitonin:4,lacticidven:4) ) Recent Results (from the past 240 hour(s))  Blood culture (routine x 2)     Status: None (Preliminary result)   Collection Time: 07/04/23  9:38 PM   Specimen: BLOOD RIGHT ARM  Result Value Ref Range Status   Specimen Description BLOOD RIGHT ARM  Final   Special Requests   Final  BOTTLES DRAWN AEROBIC AND ANAEROBIC Blood Culture adequate volume   Culture   Final    NO GROWTH 3 DAYS Performed at [facility], [address] [place], [place] [address]    Report Status PENDING  Incomplete  Culture, blood (Routine X 2) w Reflex to ID Panel     Status: None (Preliminary result)   Collection Time: 07/05/23 12:01 AM   Specimen: BLOOD  Result Value Ref Range Status   Specimen Description BLOOD BLOOD RIGHT ARM  Final   Special Requests   Final    BOTTLES DRAWN AEROBIC AND ANAEROBIC Blood Culture adequate volume   Culture   Final    NO GROWTH 2 DAYS Performed at [facility], [address], [place], [place] [address]    Report Status PENDING  Incomplete     Scheduled Meds:  aspirin EC  81 mg Oral Daily   enoxaparin (LOVENOX)  injection  40 mg Subcutaneous Daily   feeding supplement (GLUCERNA SHAKE)  237 mL Oral TID BM   insulin aspart  0-15 Units Subcutaneous TID WC   metoprolol succinate  50 mg Oral QHS   pantoprazole (PROTONIX) IV  40 mg Intravenous Q12H   simvastatin  20 mg Oral QPM   ticagrelor  90 mg Oral BID   Continuous Infusions:  cefTRIAXone (ROCEPHIN)  IV 1 g (07/07/23 1723)    Procedures/Studies: CT HEAD WO CONTRAST ( )  Result Date: 07/07/2023 CLINICAL DATA:  89 year old female with altered mental status. Encephalopathy. UTI, dehydration. EXAM: CT HEAD WITHOUT CONTRAST TECHNIQUE: Contiguous axial images were obtained from the base of the skull through the vertex without intravenous contrast. RADIATION DOSE REDUCTION: This exam was performed according to the departmental dose-optimization program which includes automated exposure control, adjustment of the mA and/or kV according to patient size and/or use of iterative reconstruction technique. COMPARISON:  Head CT 05/01/2023. FINDINGS: Study is mildly degraded by motion artifact despite repeated imaging attempts. Brain: Stable cerebral volume since May. No midline shift, ventriculomegaly, mass effect, evidence of mass lesion, intracranial hemorrhage or evidence of cortically based acute infarction. Stable gray-white matter differentiation throughout the brain. Vascular: Calcified atherosclerosis at the skull base. No suspicious intracranial vascular hyperdensity. Skull: No acute osseous abnormality identified. Sinuses/Orbits: Visualized paranasal sinuses and mastoids are stable and well aerated. Other: No acute orbit or scalp soft tissue finding. Calcified scalp vessel atherosclerosis. IMPRESSION: Mildly motion degraded exam.  No acute intracranial abnormality. Electronically Signed   By: Odessa Fleming M.D.   On: 07/07/2023 13:05   CT ABDOMEN PELVIS WO CONTRAST  Result Date: 07/04/2023 CLINICAL DATA:  Vomiting and altered mental status. EXAM: CT ABDOMEN AND  PELVIS WITHOUT CONTRAST TECHNIQUE: Multidetector CT imaging of the abdomen and pelvis was performed following the standard protocol without IV contrast. RADIATION DOSE REDUCTION: This exam was performed according to the departmental dose-optimization program which includes automated exposure control, adjustment of the mA and/or kV according to patient size and/or use of iterative reconstruction technique. COMPARISON:  June 28, 2023 FINDINGS: Lower chest: No acute abnormality. Hepatobiliary: No focal liver abnormality is seen. No gallstones, gallbladder wall thickening, or biliary dilatation. Pancreas: Unremarkable. No pancreatic ductal dilatation or surrounding inflammatory changes. Spleen: Normal in size without focal abnormality. Adrenals/Urinary Tract: A stable 11 mm diameter low-attenuation left adrenal mass is seen (approximately 15.10 Hounsfield units). The right adrenal gland is unremarkable. Kidneys are normal in size, without renal calculi or focal lesions. Mild, stable right extrarenal pelvis prominence is noted. Bladder is unremarkable. Stomach/Bowel: There is  a small hiatal hernia. The appendix is surgically absent. No evidence of bowel wall thickening, distention, or inflammatory changes. Noninflamed diverticula are seen along the junction of the descending and sigmoid colon. Vascular/Lymphatic: Aortic atherosclerosis. No enlarged abdominal or pelvic lymph nodes. Reproductive: Status post hysterectomy. The right adnexa is unremarkable. A 3.3 cm x 3.0 cm left adnexal cyst is seen. This is decreased in size when compared to the prior study. Other: No abdominal wall hernia or abnormality. No abdominopelvic ascites. Musculoskeletal: Stable degenerative and postoperative changes are seen within the lower lumbar spine. IMPRESSION: 1. Small hiatal hernia. 2. Colonic diverticulosis. 3. Stable left adrenal adenoma. No follow-up imaging is recommended. This recommendation follows ACR consensus guidelines:  Management of Incidental Adrenal Masses: A White Paper of the ACR Incidental Findings Committee. J Am Coll Radiol 2017;14:1038-1044. 4. Benign-appearing simple left adnexal cyst. No follow-up imaging is recommended. This recommendation follows ACR consensus guidelines: White Paper of the ACR Incidental Findings Committee II on Adnexal Findings. J Am Coll Radiol 2013:10:675-681. 5. Aortic atherosclerosis. Aortic Atherosclerosis (ICD10-I70.0). Electronically Signed   By: Aram Candela M.D.   On: 07/04/2023 22:19   CT ABDOMEN PELVIS W CONTRAST  Result Date: 06/28/2023 CLINICAL DATA:  Lower abdominal pain, diarrhea. EXAM: CT ABDOMEN AND PELVIS WITH CONTRAST TECHNIQUE: Multidetector CT imaging of the abdomen and pelvis was performed using the standard protocol following bolus administration of intravenous contrast. RADIATION DOSE REDUCTION: This exam was performed according to the departmental dose-optimization program which includes automated exposure control, adjustment of the mA and/or kV according to patient size and/or use of iterative reconstruction technique. CONTRAST:  OMNIPAQUE IOHEXOL 300 MG/ML  SOLN COMPARISON:  05/01/2023 FINDINGS: Lower chest: No acute abnormality. Hepatobiliary: No focal liver lesions. No gallstones, gallbladder wall thickening or inflammation. No significant biliary ductal dilatation. Pancreas: Unremarkable. No pancreatic ductal dilatation or surrounding inflammatory changes. Spleen: Normal in size without focal abnormality. Adrenals/Urinary Tract: Normal right adrenal gland. Left adrenal nodule measures 1.1 cm. This is been present since 09/06/2009. Findings compatible with a benign adenoma. No follow-up imaging advised. No nephrolithiasis or hydronephrosis. Urinary bladder appears within normal limits. Stomach/Bowel: The stomach appears nondistended. Mild wall thickening involving the distal esophagus and stomach noted. The appendix is not visualized and may be surgically  absent. No pathologic dilatation of the large or small bowel loops to suggest obstruction. Terminal ileum is visualized and is normal without wall thickening or inflammation. Mild diffuse mucosal enhancement and equivocal wall thickening involving the colon, which is most notable at the level of the sigmoid colon. No significant surrounding inflammatory fat stranding. Moderate retained stool identified within the rectum. Vascular/Lymphatic: Aortic atherosclerosis. No aneurysm. No signs of abdominopelvic adenopathy. Reproductive: Status post hysterectomy. Again seen is a triangular-shaped fluid density structure along the broad ligament which measures 4.1 x 3.7 cm, image 65/2. This is unchanged from the previous exam and is favored to represent a benign process. Other: No significant free fluid within the abdomen or pelvis. No signs of pneumoperitoneum. Musculoskeletal: No acute or suspicious osseous findings. Lumbar spondylosis. Status post right lateral plate and screw fixation with interbody fusion at L4-5. Marked degenerative disc disease noted at L2-3, L3-4 and L5-S1. IMPRESSION: 1. Mild diffuse mucosal enhancement and equivocal wall thickening involving the colon, most notable at the level of the sigmoid colon. Findings may reflect a mild colitis. 2. Mild wall thickening involving the distal esophagus and stomach, which may reflect esophagitis/gastritis. Clinical correlation advised. 3. Moderate retained stool within the rectum. 4. Stable fluid  density, triangular-shaped structure along the broad ligament of the left hemipelvis status post hysterectomy. This is favored to represent a benign process. 5. Aortic Atherosclerosis (ICD10-I70.0). Electronically Signed   By: Signa Kell M.D.   On: 06/28/2023 10:32    Catarina Hartshorn, DO  [facility]  If 7PM-7AM, please contact night-coverage [email or web address] Password [facility] 07/08/2023, 7:35 AM   LOS: 3 days

## 2023-07-08 NOTE — TOC Transition Note (Signed)
Transition of Care [facility]) - CM/SW Discharge Note   Patient Details  Name: SHAWNTAY PREST MRN: [id number] Date of Birth: 1929-08-28  Transition of Care [facility]) CM/SW Contact:  Karn Cassis, LCSW Phone Number: 07/08/2023, 1:09 PM   Clinical Narrative:  Pt d/c today. Daughter states pt is not interested in HHPT. Clifton Custard with [facility] updated and will follow for [facility]. Order in. Discussed recommended DME including wheelchair, 3N1, and hospital bed. Pt already has wheelchair and hospital bed. Daughter requests 3N1 and agreeable to [employer]. Referral sent to [facility] with [facility] to drop ship to home. No other needs reported.      Final next level of care: Home w Home Health Services Barriers to Discharge: Barriers Resolved   Patient Goals and CMS Choice   Choice offered to / list presented to : Patient  Discharge Placement                    Name of family member notified: daughter Patient and family notified of of transfer: 07/08/23  Discharge Plan and Services Additional resources added to the After Visit Summary for   In-house Referral: Clinical Social Work              DME Arranged: 3-N-1 DME Agency: [facility] Date DME Agency Contacted: 07/08/23 Time DME Agency Contacted: 44 Representative spoke with at DME Agency: Marthann Schiller HH Arranged: RN HH Agency: [facility] Date [facility] Agency Contacted: 07/08/23 Time HH Agency Contacted: [phone] Representative spoke with at [facility] Agency: Clifton Custard  Social Determinants of Health (SDOH) Interventions SDOH Screenings   Food Insecurity: No Food Insecurity (02/10/2023)  Housing: Low Risk  (02/10/2023)  Transportation Needs: No Transportation Needs (02/10/2023)  Utilities: Not At Risk (02/10/2023)  Physical Activity: Inactive (02/08/2019)  Social Connections: Unknown (02/08/2019)  Stress: No Stress Concern Present (02/08/2019)  Tobacco Use: Low Risk  (07/04/2023)     Readmission Risk Interventions    07/06/2023    3:49 PM  02/12/2023   11:26 AM 01/04/2023   11:03 AM  Readmission Risk Prevention Plan  Transportation Screening Complete Complete Complete  PCP or Specialist Appt within 3-5 Days Complete    HRI or Home Care Consult Complete Complete   Social Work Consult for Recovery Care Planning/Counseling Complete Complete Complete  Palliative Care Screening Not Applicable Not Applicable   Medication Review Oceanographer) Complete Complete

## 2023-07-08 NOTE — Progress Notes (Signed)
Patient only alert and oriented to self and place. She slept throughout the shift. No complaints of pain. Had good urine output.

## 2023-07-08 NOTE — Evaluation (Addendum)
Physical Therapy Evaluation Patient Details Name: JAQUILLA WOODROOF MRN: [id number] DOB: 06-12-1929 Today's Date: 07/08/2023  History of Present Illness  89 year old female with a history of dementia, hypertension, diabetes mellitus type 2, hyperlipidemia, stroke, coronary artery disease status post STEMI 12/2022, and constipation presenting with nausea, vomiting, and altered mental status.  The patient has had numerous similar admissions in the recent past.  Notably, the patient had recent hospital admission from 06/28/2023 to 06/29/2023 at which time the patient was treated for fecal impaction.  She was manually disimpacted and discharged home with instructions to increase MiraLAX as needed if no BM in 3 days.  Another recent hospital admission from 05/01/2023 to 05/05/2023 was noted for UTI for which she was discharged home with ciprofloxacin after treatment with ceftriaxone in the hospital.  She also had a hospital admission from 02/10/2023 to 02/13/2023 secondary to hypoglycemia and a Proteus UTI. Upon further questioning, the patient is a poor historian, and she has poor insight regarding her medications.  Patient's daughter states that the patient has had at least 5-6 loose stools on a daily basis since discharge from the hospital on 06/29/2023.  There is no hematochezia or melena.  There is no hematemesis.  Her appetite has been worse than usual.  There is been no coughing, hemoptysis, chest pain, shortness breath, abdominal pain noted.  The patient does require assistance with transfers.  Clinical Impression   Pt required mod/max assist from therapist to perform bed mobility to sitting and transferring from bed to chair. Required both tactile and verbal cueing throughout. PLAN:  Patient to be discharged home today and discharged from acute physical therapy to care of nursing for ambulation as tolerated for length of stay with recommendations stated below.       Assistance Recommended at Discharge Set up  Supervision/Assistance  If plan is discharge home, recommend the following:  Can travel by private vehicle  A lot of help with walking and/or transfers;A lot of help with bathing/dressing/bathroom;Assistance with cooking/housework;Help with stairs or ramp for entrance        Equipment Recommendations Wheelchair (measurements PT);BSC/3in1;Hospital bed  Recommendations for Other Services       Functional Status Assessment Patient has had a recent decline in their functional status and demonstrates the ability to make significant improvements in function in a reasonable and predictable amount of time.     Precautions / Restrictions Precautions Precautions: Fall Restrictions Weight Bearing Restrictions: No      Mobility  Bed Mobility Overal bed mobility: Needs Assistance             General bed mobility comments: Ptrequired tactile and verbal cues to mobilize to EOB.    Transfers   Equipment used: Pushed w/c Transfers: Bed to chair/wheelchair/BSC, Sit to/from Stand Sit to Stand: Max assist, Mod assist     Squat pivot transfers: Mod assist, Max assist    Lateral/Scoot Transfers: Mod assist, Max assist General transfer comment: Mod/Max assist needed    Ambulation/Gait                  Stairs            Wheelchair Mobility     Tilt Bed    Modified Rankin (Stroke Patients Only)       Balance Overall balance assessment: Needs assistance Sitting-balance support: Bilateral upper extremity supported, Feet supported Sitting balance-Leahy Scale: Fair Sitting balance - Comments: Fair/Poor balance seated EOB     Standing balance-Leahy Scale: Poor Standing balance comment:  Poor balance standing as pt required mod-max assist from therapist                             Pertinent Vitals/Pain      Home Living Family/patient expects to be discharged to:: Private residence Living Arrangements: Spouse/significant other Available Help at  Discharge: Family;Available 24 hours/day Type of Home: House Home Access: Ramped entrance     Alternate Level Stairs-Number of Steps: patient does not go upstairs Home Layout: Two level Home Equipment: BSC/3in1;Shower seat;Grab bars - toilet;Grab bars - tub/shower;Wheelchair - manual Additional Comments: Per chart review; pt is a poor historian.    Prior Function Prior Level of Function : Needs assist       Physical Assist : Mobility (physical);ADLs (physical) Mobility (physical): Bed mobility;Transfers ADLs (physical): IADLs Mobility Comments: non-ambulatory, stand pivot transfers Mod Independent ADLs Comments: assisted by family     Hand Dominance        Extremity/Trunk Assessment                Communication   Communication: HOH;Other (comment)  Cognition                                                General Comments      Exercises     Assessment/Plan    PT Assessment Patient needs continued PT services  PT Problem List Decreased strength;Decreased range of motion;Decreased activity tolerance;Decreased balance;Decreased mobility       PT Treatment Interventions      PT Goals (Current goals can be found in the Care Plan section)  Acute Rehab PT Goals Patient Stated Goal: Return home with assist from family PT Goal Formulation: With patient Time For Goal Achievement: 07/22/23 Potential to Achieve Goals: Fair    Frequency Min 3X/week     Co-evaluation               AM-PAC PT "6 Clicks" Mobility  Outcome Measure Help needed turning from your back to your side while in a flat bed without using bedrails?: A Lot Help needed moving from lying on your back to sitting on the side of a flat bed without using bedrails?: A Lot Help needed moving to and from a bed to a chair (including a wheelchair)?: A Lot Help needed standing up from a chair using your arms (e.g., wheelchair or bedside chair)?: A Lot Help needed to walk in  hospital room?: Total Help needed climbing 3-5 steps with a railing? : Total 6 Click Score: 10    End of Session   Activity Tolerance: Patient tolerated treatment well;No increased pain Patient left: in chair;with call bell/phone within reach Nurse Communication: Mobility status PT Visit Diagnosis: Unsteadiness on feet (R26.81);Other abnormalities of gait and mobility (R26.89);Muscle weakness (generalized) (M62.81);Difficulty in walking, not elsewhere classified (R26.2)    Time: 2130-8657 PT Time Calculation (min) (ACUTE ONLY): 19 min   Charges:   PT Evaluation $PT Eval Low Complexity: 1 Low PT Treatments $Therapeutic Activity: 8-22 mins PT General Charges $$ ACUTE PT VISIT: 1 Visit         Colleen Pecoraro SPT [place], DPT Program      This licensed practitioner was present in the room guiding the student in service delivery. Therapy student was participating in the provision of services,  and the practitioner was not engaged in treating another patient or doing other tasks at the same time.    9:35 AM, 07/08/23 Ocie Bob, MPT Physical Therapist with [facility] [phone] office [phone] mobile phone

## 2023-07-08 NOTE — Discharge Summary (Signed)
Physician Discharge Summary   Patient: [name] MRN: [id number] DOB: 11/28/1929  Admit date:     07/04/2023  Discharge date: 07/08/23  Discharge Physician: Onalee Hua Latrelle Fuston   PCP: Assunta Found, MD   Recommendations at discharge:   Please follow up with primary care provider within 1-2 weeks  Please repeat BMP and CBC in one week    Hospital Course: 89 year old female with a history of dementia, hypertension, diabetes mellitus type 2, hyperlipidemia, stroke, coronary artery disease status post STEMI 12/2022, and constipation presenting with nausea, vomiting, and altered mental status.  The patient has had numerous similar admissions in the recent past.  Notably, the patient had recent hospital admission from 06/28/2023 to 06/29/2023 at which time the patient was treated for fecal impaction.  She was manually disimpacted and discharged home with instructions to increase MiraLAX as needed if no BM in 3 days.  Another recent hospital admission from 05/01/2023 to 05/05/2023 was noted for UTI for which she was discharged home with ciprofloxacin after treatment with ceftriaxone in the hospital.  She also had a hospital admission from 02/10/2023 to 02/13/2023 secondary to hypoglycemia and a Proteus UTI. Upon further questioning, the patient is a poor historian, and she has poor insight regarding her medications. Patient's daughter states that the patient has had at least 5-6 loose stools on a daily basis since discharge from the hospital on 06/29/2023.  There is no hematochezia or melena.  There is no hematemesis.  Her appetite has been worse than usual.  There is been no coughing, hemoptysis, chest pain, shortness breath, abdominal pain noted.  The patient does require assistance with transfers.  In the ED, the patient was febrile up to 101.6 F.  She was hemodynamically stable with oxygen saturation 99% room air.  WBC 10.0, hemoglobin 15.0, platelets 171.  Sodium 135, potassium 4.0, bicarbonate 19, serum  creatinine 0.97.  LFTs were unremarkable.  CT of the abdomen and pelvis showed a small hiatus hernia, diverticulosis, stable left adrenal adenoma, and a benign-appearing left adnexal cyst.  The patient was started on cefepime initially and IV fluids.  Assessment and Plan: Acute metabolic encephalopathy -Secondary to UTI and dehydration -Continue IV fluids>>saline lock -7/13--patient more confused and agitated -discontinue empiric cefepime as this may cause encephalopathy -B12--240 -TSH--3.171 -folate--8.3 -CT brain--neg -Updated patient's daughter who confirms underlying cognitive impairment, but not to this point of aggressive behavior -prn valium/haldol -7/14--less agitated. A&Ox 2 -7/15--less agitated. A&O x 2--near baseline per DTR   Diarrhea -Check stool for C. Difficile -Stool pathogen panel -No BM since admission -07/04/2023 CT AP--small hiatus hernia, diverticulosis, stable left adrenal adenoma, left adnexal cystic benign-appearing; no bowel wall thickening   UTI -UA>50 WBC -Unfortunately urine culture was not collected -Discontinue cefepime and start ceftriaxone as discussed above -7/14--continue ceftriaxone -d/c home with 4 more days cefdinir   Uncontrolled diabetes mellitus type 2 with hyperglycemia -NovoLog sliding scale -05/02/2023 hemoglobin A1c 8.5 -Holding metformin   Coronary artery disease -Patient had recent STEMI 12/2022 with PCI to the mid-proximal RCA -Continue Brilinta and aspirin -Continue statin -Continue metoprolol succinate -No chest pain presently   Essential hypertension -Continue metoprolol succinate   Major neurocognitive disorder -Patient is having hospital delirium -Holding gabapentin temporarily   Hypokalemia/hypomagnesemia -Repleted        Consultants: none Procedures performed: none  Disposition: Home Diet recommendation:  Carb modified diet DISCHARGE MEDICATION: Allergies as of 07/08/2023       Reactions   Codeine Nausea  And Vomiting  Glipizide    Hypoglycemia with glucoses recorded in the 20s necessitating glucagon and D10 IV   Sulfa Antibiotics Other (See Comments)   Unspecified         Medication List     STOP taking these medications    polyethylene glycol 17 g packet Commonly known as: MIRALAX / GLYCOLAX       TAKE these medications    aspirin EC 81 MG tablet Take 1 tablet (81 mg total) by mouth daily. Swallow whole.   cefdinir 300 MG capsule Commonly known as: OMNICEF Take 1 capsule (300 mg total) by mouth every 12 (twelve) hours.   gabapentin 300 MG capsule Commonly known as: NEURONTIN Take 1 capsule (300 mg total) by mouth 2 (two) times daily. Take 300 mg three times daily What changed:  how much to take additional instructions   metFORMIN 500 MG tablet Commonly known as: GLUCOPHAGE Take 500 mg by mouth 2 (two) times daily.   metoprolol succinate 50 MG 24 hr tablet Commonly known as: TOPROL-XL Take 50 mg by mouth at bedtime.   silodosin 8 MG Caps capsule Commonly known as: RAPAFLO Take 1 capsule (8 mg total) by mouth daily with breakfast.   simvastatin 20 MG tablet Commonly known as: ZOCOR Take 20 mg by mouth every evening.   ticagrelor 90 MG Tabs tablet Commonly known as: BRILINTA Take 1 tablet (90 mg total) by mouth 2 (two) times daily.   Trospium Chloride 60 MG Cp24 Take 1 capsule by mouth daily.   Vitamin D3 125 MCG (5000 UT) Caps Take 1 capsule (5,000 Units total) by mouth daily.   Zoloft 50 MG tablet Generic drug: sertraline Take 50 mg by mouth daily. Take with 25mg  dose to equal 75mg  daily.   sertraline 25 MG tablet Commonly known as: ZOLOFT Take 25 mg by mouth daily. Take with 50mg  dose to equal 75mg  daily.               Durable Medical Equipment  (From admission, onward)           Start     Ordered   07/08/23 1227  For home use only DME 3 n 1  Once        07/08/23 1226            Discharge Exam: Filed Weights   07/04/23  1910  Weight: 73 kg   HEENT:  /AT, No thrush, no icterus CV:  RRR, no rub, no S3, no S4 Lung:  CTA, no wheeze, no rhonchi Abd:  soft/+BS, NT Ext:  No edema, no lymphangitis, no synovitis, no rash   Condition at discharge: stable  The results of significant diagnostics from this hospitalization (including imaging, microbiology, ancillary and laboratory) are listed below for reference.   Imaging Studies: CT HEAD WO CONTRAST ( )  Result Date: 07/07/2023 CLINICAL DATA:  89 year old female with altered mental status. Encephalopathy. UTI, dehydration. EXAM: CT HEAD WITHOUT CONTRAST TECHNIQUE: Contiguous axial images were obtained from the base of the skull through the vertex without intravenous contrast. RADIATION DOSE REDUCTION: This exam was performed according to the departmental dose-optimization program which includes automated exposure control, adjustment of the mA and/or kV according to patient size and/or use of iterative reconstruction technique. COMPARISON:  Head CT 05/01/2023. FINDINGS: Study is mildly degraded by motion artifact despite repeated imaging attempts. Brain: Stable cerebral volume since May. No midline shift, ventriculomegaly, mass effect, evidence of mass lesion, intracranial hemorrhage or evidence of cortically based acute infarction. Stable gray-white  matter differentiation throughout the brain. Vascular: Calcified atherosclerosis at the skull base. No suspicious intracranial vascular hyperdensity. Skull: No acute osseous abnormality identified. Sinuses/Orbits: Visualized paranasal sinuses and mastoids are stable and well aerated. Other: No acute orbit or scalp soft tissue finding. Calcified scalp vessel atherosclerosis. IMPRESSION: Mildly motion degraded exam.  No acute intracranial abnormality. Electronically Signed   By: Odessa Fleming M.D.   On: 07/07/2023 13:05   CT ABDOMEN PELVIS WO CONTRAST  Result Date: 07/04/2023 CLINICAL DATA:  Vomiting and altered mental status.  EXAM: CT ABDOMEN AND PELVIS WITHOUT CONTRAST TECHNIQUE: Multidetector CT imaging of the abdomen and pelvis was performed following the standard protocol without IV contrast. RADIATION DOSE REDUCTION: This exam was performed according to the departmental dose-optimization program which includes automated exposure control, adjustment of the mA and/or kV according to patient size and/or use of iterative reconstruction technique. COMPARISON:  June 28, 2023 FINDINGS: Lower chest: No acute abnormality. Hepatobiliary: No focal liver abnormality is seen. No gallstones, gallbladder wall thickening, or biliary dilatation. Pancreas: Unremarkable. No pancreatic ductal dilatation or surrounding inflammatory changes. Spleen: Normal in size without focal abnormality. Adrenals/Urinary Tract: A stable 11 mm diameter low-attenuation left adrenal mass is seen (approximately 15.10 Hounsfield units). The right adrenal gland is unremarkable. Kidneys are normal in size, without renal calculi or focal lesions. Mild, stable right extrarenal pelvis prominence is noted. Bladder is unremarkable. Stomach/Bowel: There is a small hiatal hernia. The appendix is surgically absent. No evidence of bowel wall thickening, distention, or inflammatory changes. Noninflamed diverticula are seen along the junction of the descending and sigmoid colon. Vascular/Lymphatic: Aortic atherosclerosis. No enlarged abdominal or pelvic lymph nodes. Reproductive: Status post hysterectomy. The right adnexa is unremarkable. A 3.3 cm x 3.0 cm left adnexal cyst is seen. This is decreased in size when compared to the prior study. Other: No abdominal wall hernia or abnormality. No abdominopelvic ascites. Musculoskeletal: Stable degenerative and postoperative changes are seen within the lower lumbar spine. IMPRESSION: 1. Small hiatal hernia. 2. Colonic diverticulosis. 3. Stable left adrenal adenoma. No follow-up imaging is recommended. This recommendation follows ACR consensus  guidelines: Management of Incidental Adrenal Masses: A White Paper of the ACR Incidental Findings Committee. J Am Coll Radiol 2017;14:1038-1044. 4. Benign-appearing simple left adnexal cyst. No follow-up imaging is recommended. This recommendation follows ACR consensus guidelines: White Paper of the ACR Incidental Findings Committee II on Adnexal Findings. J Am Coll Radiol 2013:10:675-681. 5. Aortic atherosclerosis. Aortic Atherosclerosis (ICD10-I70.0). Electronically Signed   By: Aram Candela M.D.   On: 07/04/2023 22:19   CT ABDOMEN PELVIS W CONTRAST  Result Date: 06/28/2023 CLINICAL DATA:  Lower abdominal pain, diarrhea. EXAM: CT ABDOMEN AND PELVIS WITH CONTRAST TECHNIQUE: Multidetector CT imaging of the abdomen and pelvis was performed using the standard protocol following bolus administration of intravenous contrast. RADIATION DOSE REDUCTION: This exam was performed according to the departmental dose-optimization program which includes automated exposure control, adjustment of the mA and/or kV according to patient size and/or use of iterative reconstruction technique. CONTRAST:  OMNIPAQUE IOHEXOL 300 MG/ML  SOLN COMPARISON:  05/01/2023 FINDINGS: Lower chest: No acute abnormality. Hepatobiliary: No focal liver lesions. No gallstones, gallbladder wall thickening or inflammation. No significant biliary ductal dilatation. Pancreas: Unremarkable. No pancreatic ductal dilatation or surrounding inflammatory changes. Spleen: Normal in size without focal abnormality. Adrenals/Urinary Tract: Normal right adrenal gland. Left adrenal nodule measures 1.1 cm. This is been present since 09/06/2009. Findings compatible with a benign adenoma. No follow-up imaging advised. No nephrolithiasis or hydronephrosis. Urinary  bladder appears within normal limits. Stomach/Bowel: The stomach appears nondistended. Mild wall thickening involving the distal esophagus and stomach noted. The appendix is not visualized and may be  surgically absent. No pathologic dilatation of the large or small bowel loops to suggest obstruction. Terminal ileum is visualized and is normal without wall thickening or inflammation. Mild diffuse mucosal enhancement and equivocal wall thickening involving the colon, which is most notable at the level of the sigmoid colon. No significant surrounding inflammatory fat stranding. Moderate retained stool identified within the rectum. Vascular/Lymphatic: Aortic atherosclerosis. No aneurysm. No signs of abdominopelvic adenopathy. Reproductive: Status post hysterectomy. Again seen is a triangular-shaped fluid density structure along the broad ligament which measures 4.1 x 3.7 cm, image 65/2. This is unchanged from the previous exam and is favored to represent a benign process. Other: No significant free fluid within the abdomen or pelvis. No signs of pneumoperitoneum. Musculoskeletal: No acute or suspicious osseous findings. Lumbar spondylosis. Status post right lateral plate and screw fixation with interbody fusion at L4-5. Marked degenerative disc disease noted at L2-3, L3-4 and L5-S1. IMPRESSION: 1. Mild diffuse mucosal enhancement and equivocal wall thickening involving the colon, most notable at the level of the sigmoid colon. Findings may reflect a mild colitis. 2. Mild wall thickening involving the distal esophagus and stomach, which may reflect esophagitis/gastritis. Clinical correlation advised. 3. Moderate retained stool within the rectum. 4. Stable fluid density, triangular-shaped structure along the broad ligament of the left hemipelvis status post hysterectomy. This is favored to represent a benign process. 5. Aortic Atherosclerosis (ICD10-I70.0). Electronically Signed   By: Signa Kell M.D.   On: 06/28/2023 10:32    Microbiology: Results for orders placed or performed during the hospital encounter of 07/04/23  Blood culture (routine x 2)     Status: None (Preliminary result)   Collection Time:  07/04/23  9:38 PM   Specimen: BLOOD RIGHT ARM  Result Value Ref Range Status   Specimen Description BLOOD RIGHT ARM  Final   Special Requests   Final    BOTTLES DRAWN AEROBIC AND ANAEROBIC Blood Culture adequate volume   Culture   Final    NO GROWTH 4 DAYS Performed at [facility], [address] [place], [place] [address]    Report Status PENDING  Incomplete  Culture, blood (Routine X 2) w Reflex to ID Panel     Status: None (Preliminary result)   Collection Time: 07/05/23 12:01 AM   Specimen: BLOOD  Result Value Ref Range Status   Specimen Description BLOOD BLOOD RIGHT ARM  Final   Special Requests   Final    BOTTLES DRAWN AEROBIC AND ANAEROBIC Blood Culture adequate volume   Culture   Final    NO GROWTH 3 DAYS Performed at [facility], [address] [place], [place] [address]    Report Status PENDING  Incomplete    Labs: CBC: Recent Labs  Lab 07/04/23 2052 07/05/23 0522 07/06/23 0458 07/08/23 0526  WBC 10.0 7.7 9.0 7.9  HGB 15.0 11.6* 13.0 13.2  HCT 47.2* 35.8* 40.9 40.9  MCV 88.6 88.2 89.1 87.8  PLT 194 171 199 219   Basic Metabolic Panel: Recent Labs  Lab 07/04/23 2018 07/05/23 0522 07/06/23 0458 07/08/23 0526  NA 135 134* 136 138  K 4.0 3.2* 3.5 3.1*  CL 105 104 103 106  CO2 19* 21* 24 23  GLUCOSE 148* 113* 183* 197*  BUN 22 21 18 14   CREATININE 0.97 0.80 0.76 0.67  CALCIUM 9.6 8.6* 9.1  9.0  MG  --  1.8 1.6* 2.2  PHOS  --  3.3  --   --    Liver Function Tests: Recent Labs  Lab 07/04/23 2018 07/05/23 0522  AST 21 12*  ALT 13 10  ALKPHOS 94 71  BILITOT 0.6 0.6  PROT 6.6 5.4*  ALBUMIN 3.4* 2.8*   CBG: Recent Labs  Lab 07/07/23 1536 07/07/23 2143 07/08/23 0749 07/08/23 1134 07/08/23 1151  GLUCAP 191* 178* 187* 410* 414*    Discharge time spent: greater than 30 minutes.  Signed: Catarina Hartshorn, MD [facility] 07/08/2023

## 2023-07-08 NOTE — Plan of Care (Signed)

## 2023-07-08 NOTE — Care Management Important Message (Signed)
Important Message  Patient Details  Name: [name] MRN: [id number] Date of Birth: Sep 23, 1929   Medicare Important Message Given:  Yes (spoke with Jola Schmidt (daughter) by phone , no additional copy needed)     Corey Harold 07/08/2023, 12:05 PM

## 2023-07-09 LAB — CULTURE, BLOOD (ROUTINE X 2): Culture: NO GROWTH

## 2023-07-10 ENCOUNTER — Emergency Department (HOSPITAL_COMMUNITY): Payer: Medicare Other

## 2023-07-10 ENCOUNTER — Encounter (HOSPITAL_COMMUNITY): Payer: Self-pay | Admitting: *Deleted

## 2023-07-10 ENCOUNTER — Inpatient Hospital Stay (HOSPITAL_COMMUNITY)
Admission: EM | Admit: 2023-07-10 | Discharge: 2023-07-12 | DRG: 689 | Disposition: A | Discharge status: Skilled Nursing Facility | Payer: Medicare Other | Attending: Family Medicine | Admitting: Family Medicine

## 2023-07-10 ENCOUNTER — Other Ambulatory Visit: Payer: Self-pay

## 2023-07-10 DIAGNOSIS — I25700 Atherosclerosis of coronary artery bypass graft(s), unspecified, with unstable angina pectoris: Secondary | ICD-10-CM

## 2023-07-10 DIAGNOSIS — Z79899 Other long term (current) drug therapy: Secondary | ICD-10-CM | POA: Diagnosis not present

## 2023-07-10 DIAGNOSIS — G9009 Other idiopathic peripheral autonomic neuropathy: Secondary | ICD-10-CM | POA: Diagnosis not present

## 2023-07-10 DIAGNOSIS — Z9842 Cataract extraction status, left eye: Secondary | ICD-10-CM

## 2023-07-10 DIAGNOSIS — Z1152 Encounter for screening for COVID-19: Secondary | ICD-10-CM | POA: Diagnosis not present

## 2023-07-10 DIAGNOSIS — G9341 Metabolic encephalopathy: Secondary | ICD-10-CM | POA: Diagnosis not present

## 2023-07-10 DIAGNOSIS — I252 Old myocardial infarction: Secondary | ICD-10-CM | POA: Diagnosis not present

## 2023-07-10 DIAGNOSIS — I2510 Atherosclerotic heart disease of native coronary artery without angina pectoris: Secondary | ICD-10-CM | POA: Diagnosis not present

## 2023-07-10 DIAGNOSIS — Z823 Family history of stroke: Secondary | ICD-10-CM

## 2023-07-10 DIAGNOSIS — Z66 Do not resuscitate: Secondary | ICD-10-CM | POA: Diagnosis not present

## 2023-07-10 DIAGNOSIS — F32A Depression, unspecified: Secondary | ICD-10-CM | POA: Diagnosis present

## 2023-07-10 DIAGNOSIS — Z888 Allergy status to other drugs, medicaments and biological substances status: Secondary | ICD-10-CM | POA: Diagnosis not present

## 2023-07-10 DIAGNOSIS — R4182 Altered mental status, unspecified: Secondary | ICD-10-CM | POA: Diagnosis present

## 2023-07-10 DIAGNOSIS — N390 Urinary tract infection, site not specified: Secondary | ICD-10-CM | POA: Diagnosis not present

## 2023-07-10 DIAGNOSIS — Z7984 Long term (current) use of oral hypoglycemic drugs: Secondary | ICD-10-CM

## 2023-07-10 DIAGNOSIS — M7989 Other specified soft tissue disorders: Secondary | ICD-10-CM | POA: Diagnosis not present

## 2023-07-10 DIAGNOSIS — M6281 Muscle weakness (generalized): Secondary | ICD-10-CM | POA: Diagnosis not present

## 2023-07-10 DIAGNOSIS — M79603 Pain in arm, unspecified: Secondary | ICD-10-CM | POA: Diagnosis not present

## 2023-07-10 DIAGNOSIS — Z0389 Encounter for observation for other suspected diseases and conditions ruled out: Secondary | ICD-10-CM | POA: Diagnosis not present

## 2023-07-10 DIAGNOSIS — M109 Gout, unspecified: Secondary | ICD-10-CM | POA: Diagnosis not present

## 2023-07-10 DIAGNOSIS — Z8673 Personal history of transient ischemic attack (TIA), and cerebral infarction without residual deficits: Secondary | ICD-10-CM

## 2023-07-10 DIAGNOSIS — I25810 Atherosclerosis of coronary artery bypass graft(s) without angina pectoris: Secondary | ICD-10-CM | POA: Diagnosis not present

## 2023-07-10 DIAGNOSIS — M71432 Calcium deposit in bursa, left wrist: Secondary | ICD-10-CM | POA: Diagnosis not present

## 2023-07-10 DIAGNOSIS — Z7902 Long term (current) use of antithrombotics/antiplatelets: Secondary | ICD-10-CM | POA: Diagnosis not present

## 2023-07-10 DIAGNOSIS — N3281 Overactive bladder: Secondary | ICD-10-CM | POA: Diagnosis not present

## 2023-07-10 DIAGNOSIS — Z7401 Bed confinement status: Secondary | ICD-10-CM | POA: Diagnosis not present

## 2023-07-10 DIAGNOSIS — R1319 Other dysphagia: Secondary | ICD-10-CM | POA: Diagnosis not present

## 2023-07-10 DIAGNOSIS — Z885 Allergy status to narcotic agent status: Secondary | ICD-10-CM

## 2023-07-10 DIAGNOSIS — G9389 Other specified disorders of brain: Secondary | ICD-10-CM | POA: Diagnosis not present

## 2023-07-10 DIAGNOSIS — R404 Transient alteration of awareness: Secondary | ICD-10-CM | POA: Diagnosis not present

## 2023-07-10 DIAGNOSIS — R5381 Other malaise: Secondary | ICD-10-CM | POA: Diagnosis not present

## 2023-07-10 DIAGNOSIS — Z7982 Long term (current) use of aspirin: Secondary | ICD-10-CM

## 2023-07-10 DIAGNOSIS — Z8249 Family history of ischemic heart disease and other diseases of the circulatory system: Secondary | ICD-10-CM

## 2023-07-10 DIAGNOSIS — I10 Essential (primary) hypertension: Secondary | ICD-10-CM | POA: Diagnosis present

## 2023-07-10 DIAGNOSIS — R609 Edema, unspecified: Secondary | ICD-10-CM | POA: Diagnosis not present

## 2023-07-10 DIAGNOSIS — Z743 Need for continuous supervision: Secondary | ICD-10-CM | POA: Diagnosis not present

## 2023-07-10 DIAGNOSIS — E1142 Type 2 diabetes mellitus with diabetic polyneuropathy: Secondary | ICD-10-CM

## 2023-07-10 DIAGNOSIS — E785 Hyperlipidemia, unspecified: Secondary | ICD-10-CM

## 2023-07-10 DIAGNOSIS — R339 Retention of urine, unspecified: Secondary | ICD-10-CM | POA: Diagnosis not present

## 2023-07-10 DIAGNOSIS — I6782 Cerebral ischemia: Secondary | ICD-10-CM | POA: Diagnosis not present

## 2023-07-10 DIAGNOSIS — M1812 Unilateral primary osteoarthritis of first carpometacarpal joint, left hand: Secondary | ICD-10-CM | POA: Diagnosis not present

## 2023-07-10 DIAGNOSIS — M79642 Pain in left hand: Secondary | ICD-10-CM | POA: Diagnosis not present

## 2023-07-10 DIAGNOSIS — Z882 Allergy status to sulfonamides status: Secondary | ICD-10-CM | POA: Diagnosis not present

## 2023-07-10 DIAGNOSIS — E1165 Type 2 diabetes mellitus with hyperglycemia: Secondary | ICD-10-CM | POA: Diagnosis not present

## 2023-07-10 DIAGNOSIS — M19031 Primary osteoarthritis, right wrist: Secondary | ICD-10-CM | POA: Diagnosis not present

## 2023-07-10 DIAGNOSIS — I6523 Occlusion and stenosis of bilateral carotid arteries: Secondary | ICD-10-CM | POA: Diagnosis not present

## 2023-07-10 DIAGNOSIS — Z9841 Cataract extraction status, right eye: Secondary | ICD-10-CM

## 2023-07-10 DIAGNOSIS — I4510 Unspecified right bundle-branch block: Secondary | ICD-10-CM | POA: Diagnosis present

## 2023-07-10 DIAGNOSIS — M19042 Primary osteoarthritis, left hand: Secondary | ICD-10-CM | POA: Diagnosis not present

## 2023-07-10 LAB — CBC WITH DIFFERENTIAL/PLATELET
Abs Immature Granulocytes: 0.02 10*3/uL (ref 0.00–0.07)
Basophils Absolute: 0 10*3/uL (ref 0.0–0.1)
Basophils Relative: 1 %
Eosinophils Absolute: 0.2 10*3/uL (ref 0.0–0.5)
Eosinophils Relative: 2 %
HCT: 34.4 % — ABNORMAL LOW (ref 36.0–46.0)
Hemoglobin: 11.1 g/dL — ABNORMAL LOW (ref 12.0–15.0)
Immature Granulocytes: 0 %
Lymphocytes Relative: 22 %
Lymphs Abs: 1.8 10*3/uL (ref 0.7–4.0)
MCH: 28.5 pg (ref 26.0–34.0)
MCHC: 32.3 g/dL (ref 30.0–36.0)
MCV: 88.2 fL (ref 80.0–100.0)
Monocytes Absolute: 1 10*3/uL (ref 0.1–1.0)
Monocytes Relative: 12 %
Neutro Abs: 5.3 10*3/uL (ref 1.7–7.7)
Neutrophils Relative %: 63 %
Platelets: 201 10*3/uL (ref 150–400)
RBC: 3.9 MIL/uL (ref 3.87–5.11)
RDW: 15.3 % (ref 11.5–15.5)
WBC: 8.3 10*3/uL (ref 4.0–10.5)
nRBC: 0 % (ref 0.0–0.2)

## 2023-07-10 LAB — COMPREHENSIVE METABOLIC PANEL
ALT: 12 U/L (ref 0–44)
AST: 14 U/L — ABNORMAL LOW (ref 15–41)
Albumin: 2.7 g/dL — ABNORMAL LOW (ref 3.5–5.0)
Alkaline Phosphatase: 72 U/L (ref 38–126)
Anion gap: 5 (ref 5–15)
BUN: 23 mg/dL (ref 8–23)
CO2: 24 mmol/L (ref 22–32)
Calcium: 8.9 mg/dL (ref 8.9–10.3)
Chloride: 104 mmol/L (ref 98–111)
Creatinine, Ser: 0.81 mg/dL (ref 0.44–1.00)
GFR, Estimated: >60 mL/min (ref >60)
Glucose, Bld: 226 mg/dL — ABNORMAL HIGH (ref 70–99)
Potassium: 4.2 mmol/L (ref 3.5–5.1)
Sodium: 133 mmol/L — ABNORMAL LOW (ref 135–145)
Total Bilirubin: 0.6 mg/dL (ref 0.3–1.2)
Total Protein: 5.8 g/dL — ABNORMAL LOW (ref 6.5–8.1)

## 2023-07-10 LAB — URINALYSIS, W/ REFLEX TO CULTURE (INFECTION SUSPECTED)
Bilirubin Urine: NEGATIVE
Glucose, UA: 150 mg/dL — AB
Ketones, ur: NEGATIVE mg/dL
Nitrite: NEGATIVE
Protein, ur: NEGATIVE mg/dL
Specific Gravity, Urine: 1.017 (ref 1.005–1.030)
pH: 5 (ref 5.0–8.0)

## 2023-07-10 LAB — RESP PANEL BY RT-PCR (RSV, FLU A&B, COVID)  RVPGX2
Influenza A by PCR: NEGATIVE
Influenza B by PCR: NEGATIVE
Resp Syncytial Virus by PCR: NEGATIVE
SARS Coronavirus 2 by RT PCR: NEGATIVE

## 2023-07-10 LAB — PROTIME-INR
INR: 1 (ref 0.8–1.2)
Prothrombin Time: 13.5 seconds (ref 11.4–15.2)

## 2023-07-10 LAB — CULTURE, BLOOD (ROUTINE X 2)

## 2023-07-10 LAB — I-STAT CHEM 8, ED
BUN: 28 mg/dL — ABNORMAL HIGH (ref 8–23)
Calcium, Ion: 1.25 mmol/L (ref 1.15–1.40)
Chloride: 103 mmol/L (ref 98–111)
Creatinine, Ser: 0.8 mg/dL (ref 0.44–1.00)
Glucose, Bld: 210 mg/dL — ABNORMAL HIGH (ref 70–99)
HCT: 34 % — ABNORMAL LOW (ref 36.0–46.0)
Hemoglobin: 11.6 g/dL — ABNORMAL LOW (ref 12.0–15.0)
Potassium: 4.8 mmol/L (ref 3.5–5.1)
Sodium: 136 mmol/L (ref 135–145)
TCO2: 26 mmol/L (ref 22–32)

## 2023-07-10 LAB — TROPONIN I (HIGH SENSITIVITY)
Troponin I (High Sensitivity): 12 ng/L (ref <18)
Troponin I (High Sensitivity): 11 ng/L (ref <18)

## 2023-07-10 LAB — APTT: aPTT: 26 seconds (ref 24–36)

## 2023-07-10 LAB — LACTIC ACID, PLASMA
Lactic Acid, Venous: 1.5 mmol/L (ref 0.5–1.9)
Lactic Acid, Venous: 1.5 mmol/L (ref 0.5–1.9)

## 2023-07-10 MED ORDER — VITAMIN D 25 MCG (1000 UNIT) PO TABS
5000.0000 [IU] | ORAL_TABLET | Freq: Every day | ORAL | Status: DC
  Administered 2023-07-10 – 2023-07-12 (×3): 5000 [IU] via ORAL
  Filled 2023-07-10 (×6): qty 5

## 2023-07-10 MED ORDER — MAGNESIUM HYDROXIDE 400 MG/5ML PO SUSP: 30.0000 mL | Freq: Every day | ORAL | Status: DC | PRN

## 2023-07-10 MED ORDER — ACETAMINOPHEN 650 MG RE SUPP: 650.0000 mg | Freq: Four times a day (QID) | RECTAL | Status: DC | PRN

## 2023-07-10 MED ORDER — ONDANSETRON HCL 4 MG/2ML IJ SOLN: 4.0000 mg | Freq: Four times a day (QID) | INTRAMUSCULAR | Status: DC | PRN

## 2023-07-10 MED ORDER — VANCOMYCIN HCL IN DEXTROSE 1-5 GM/200ML-% IV SOLN
1000.0000 mg | Freq: Once | INTRAVENOUS | Status: AC
  Administered 2023-07-10: 1000 mg via INTRAVENOUS
  Filled 2023-07-10: qty 200

## 2023-07-10 MED ORDER — METRONIDAZOLE 500 MG/100ML IV SOLN
500.0000 mg | Freq: Once | INTRAVENOUS | Status: AC
  Administered 2023-07-10: 500 mg via INTRAVENOUS
  Filled 2023-07-10: qty 100

## 2023-07-10 MED ORDER — GABAPENTIN 300 MG PO CAPS: 600.0000 mg | ORAL_CAPSULE | Freq: Every day | ORAL | Status: DC

## 2023-07-10 MED ORDER — GABAPENTIN 300 MG PO CAPS
300.0000 mg | ORAL_CAPSULE | Freq: Every day | ORAL | Status: DC
  Administered 2023-07-10 – 2023-07-11 (×2): 300 mg via ORAL
  Filled 2023-07-10 (×2): qty 1

## 2023-07-10 MED ORDER — SIMVASTATIN 20 MG PO TABS
20.0000 mg | ORAL_TABLET | Freq: Every evening | ORAL | Status: DC
  Administered 2023-07-10 – 2023-07-12 (×3): 20 mg via ORAL
  Filled 2023-07-10 (×3): qty 1

## 2023-07-10 MED ORDER — SODIUM CHLORIDE 0.9 % IV SOLN
2.0000 g | Freq: Once | INTRAVENOUS | Status: AC
  Administered 2023-07-10: 2 g via INTRAVENOUS
  Filled 2023-07-10: qty 12.5

## 2023-07-10 MED ORDER — LACTATED RINGERS IV SOLN: INTRAVENOUS | Status: DC

## 2023-07-10 MED ORDER — METOPROLOL SUCCINATE ER 50 MG PO TB24
50.0000 mg | ORAL_TABLET | Freq: Every day | ORAL | Status: DC
  Administered 2023-07-11: 50 mg via ORAL
  Filled 2023-07-10: qty 1

## 2023-07-10 MED ORDER — VANCOMYCIN HCL 1250 MG/250ML IV SOLN: 1250.0000 mg | INTRAVENOUS | Status: DC

## 2023-07-10 MED ORDER — ASPIRIN 81 MG PO TBEC
81.0000 mg | DELAYED_RELEASE_TABLET | Freq: Every day | ORAL | Status: DC
  Administered 2023-07-10 – 2023-07-12 (×3): 81 mg via ORAL
  Filled 2023-07-10 (×3): qty 1

## 2023-07-10 MED ORDER — ENOXAPARIN SODIUM 40 MG/0.4ML IJ SOSY
40.0000 mg | PREFILLED_SYRINGE | INTRAMUSCULAR | Status: DC
  Administered 2023-07-10 – 2023-07-11 (×2): 40 mg via SUBCUTANEOUS
  Filled 2023-07-10 (×2): qty 0.4

## 2023-07-10 MED ORDER — SODIUM CHLORIDE 0.9 % IV SOLN: INTRAVENOUS | Status: DC

## 2023-07-10 MED ORDER — TRAZODONE HCL 50 MG PO TABS: 25.0000 mg | ORAL_TABLET | Freq: Every evening | ORAL | Status: DC | PRN

## 2023-07-10 MED ORDER — TICAGRELOR 90 MG PO TABS
90.0000 mg | ORAL_TABLET | Freq: Two times a day (BID) | ORAL | Status: DC
  Administered 2023-07-10 – 2023-07-12 (×4): 90 mg via ORAL
  Filled 2023-07-10 (×4): qty 1

## 2023-07-10 MED ORDER — TAMSULOSIN HCL 0.4 MG PO CAPS
0.4000 mg | ORAL_CAPSULE | Freq: Every day | ORAL | Status: DC
  Administered 2023-07-10 – 2023-07-12 (×3): 0.4 mg via ORAL
  Filled 2023-07-10 (×3): qty 1

## 2023-07-10 MED ORDER — SODIUM CHLORIDE 0.9 % IV SOLN
1.0000 g | INTRAVENOUS | Status: DC
  Administered 2023-07-11: 1 g via INTRAVENOUS
  Filled 2023-07-10 (×2): qty 10

## 2023-07-10 MED ORDER — GABAPENTIN 300 MG PO CAPS
600.0000 mg | ORAL_CAPSULE | Freq: Every day | ORAL | Status: DC
  Administered 2023-07-11 – 2023-07-12 (×2): 600 mg via ORAL
  Filled 2023-07-10 (×2): qty 2

## 2023-07-10 MED ORDER — SERTRALINE HCL 50 MG PO TABS
75.0000 mg | ORAL_TABLET | Freq: Every day | ORAL | Status: DC
  Administered 2023-07-10 – 2023-07-12 (×3): 75 mg via ORAL
  Filled 2023-07-10 (×3): qty 2

## 2023-07-10 MED ORDER — SODIUM CHLORIDE 0.9 % IV SOLN: 2.0000 g | Freq: Two times a day (BID) | INTRAVENOUS | Status: DC

## 2023-07-10 MED ORDER — TROSPIUM CHLORIDE ER 60 MG PO CP24: 1.0000 | ORAL_CAPSULE | Freq: Every day | ORAL | Status: DC

## 2023-07-10 MED ORDER — ACETAMINOPHEN 325 MG PO TABS
650.0000 mg | ORAL_TABLET | Freq: Four times a day (QID) | ORAL | Status: DC | PRN
  Administered 2023-07-11 – 2023-07-12 (×2): 650 mg via ORAL
  Filled 2023-07-10 (×2): qty 2

## 2023-07-10 MED ORDER — VANCOMYCIN HCL 500 MG/100ML IV SOLN
500.0000 mg | Freq: Once | INTRAVENOUS | Status: AC
  Administered 2023-07-10: 500 mg via INTRAVENOUS
  Filled 2023-07-10 (×2): qty 100

## 2023-07-10 MED ORDER — ONDANSETRON HCL 4 MG PO TABS: 4.0000 mg | ORAL_TABLET | Freq: Four times a day (QID) | ORAL | Status: DC | PRN

## 2023-07-10 NOTE — Assessment & Plan Note (Addendum)
-   This is of unclear etiology. - It could be related to persistent UTI and her missing of antibiotic doses. - We will follow neurochecks every 4 hours for 24 hours. - Management otherwise as below.

## 2023-07-10 NOTE — H&P (Signed)
[facility]   PATIENT NAME: [name]    MR#:  [id number]  DATE OF BIRTH:  11/20/1929  DATE OF ADMISSION:  07/10/2023  PRIMARY CARE PHYSICIAN: Assunta Found, MD   Patient is coming from: Home  REQUESTING/REFERRING PHYSICIAN: Arthor Captain, PA-C   CHIEF COMPLAINT:   Chief Complaint  Patient presents with  . Altered Mental Status    HISTORY OF PRESENT ILLNESS:  [name] is a 89 y.o. female with medical history significant for coronary artery disease, type 2 diabetes mellitus with peripheral neuropathy, diastolic dysfunction, hypertension and CVA, who presented to the emergency room with acute onset of worsening altered mental status.  The patient was recently admitted here for UTI and acute metabolic encephalopathy, with improvement over the last couple of days until today when this happened.  The patient was fairly confused today and thought this year is 78.  Her daughter reported that she missed 2 doses of her oral antibiotic Omnicef over the last couple of days.  Her Home health nurse noted she was fairly lethargic and confused and was complaining of severe pain and swelling in the right wrist.  No falls or injuries were reported.  No nausea or vomiting or abdominal pain.  No fever or chills.  No worsening urinary symptoms.  No cough or wheezing or hemoptysis.  No chest pain or palpitations.  ED Course: When she came to the ER, temperature was 95.7 with otherwise normal vital signs.  Labs revealed blood glucose of 210 and BUN of 28.  CBC showed hemoglobin of 11.1 and hematocrit 34.4.  Repeat H&H were 11.6 and 34.  Lactic acid was 1.5 and later the same.  High sensitive troponin I was 12 and later 11. EKG as reviewed by me : EKG showed normal sinus rhythm with a rate of 77 with right bundle branch block and Q waves anterolaterally and inferiorly. Imaging: Right wrist x-ray showed degenerative changes in the right wrist with diffuse demineralization, soft  tissue calcification that may represent ligamentous calcifications or degenerative calcification with possible loose bodies with no evidence for osteomyelitis.  It showed soft tissue swelling.  Left hand x-ray showed degenerative changes in the left hand and wrist with likely ligamentous calcifications with no acute bony abnormalities.  The patient was given IV cefepime, vancomycin and Flagyl was started on IV normal saline at 150 mL/h.  She will be admitted to an observation medical telemetry bed for further evaluation and management. PAST MEDICAL HISTORY:   Past Medical History:  Diagnosis Date  . CAD (coronary artery disease)    a. 12/2022 Inf STEMI/PCI: LM nl, LAD mild diff dzs, D1 50, D2 50., LCX 75d, OM1 60, RCA 90p (2.5x20 Synergy XD DES), 100d (PTCA-->90 residual).  . Diabetes mellitus with neuropathy   . Diabetic neuropathy (HCC)   . Diastolic dysfunction    a. 12/2022 Echo: EF 60-65%, no rwma, GrI DD, nl RV size/fxn, triv MR.  [name] Hypertension   . Stroke (HCC)   . Urinary incontinence     PAST SURGICAL HISTORY:   Past Surgical History:  Procedure Laterality Date  . ABDOMINAL HYSTERECTOMY    . APPENDECTOMY    . BREAST SURGERY     begnin tumor removed  . CATARACT EXTRACTION, BILATERAL    . CORONARY ANGIOGRAPHY N/A 12/31/2022   Procedure: CORONARY ANGIOGRAPHY;  Surgeon: Yvonne Kendall, MD;  Location: [facility];  Service: Cardiovascular;  Laterality: N/A;  . CORONARY/GRAFT ACUTE MI REVASCULARIZATION N/A  12/31/2022   Procedure: Coronary/Graft Acute MI Revascularization;  Surgeon: Yvonne Kendall, MD;  Location: [facility];  Service: Cardiovascular;  Laterality: N/A;    SOCIAL HISTORY:   Social History   Tobacco Use  . Smoking status: Never  . Smokeless tobacco: Never  Substance Use Topics  . Alcohol use: No    FAMILY HISTORY:   Family History  Problem Relation Age of Onset  . CAD Mother   . Hypertension Mother   . Stroke Father   . Heart attack  Brother   . Cancer Brother     DRUG ALLERGIES:   Allergies  Allergen Reactions  . Codeine Nausea And Vomiting  . Glipizide     Hypoglycemia with glucoses recorded in the 20s necessitating glucagon and D10 IV  . Sulfa Antibiotics Other (See Comments)    Unspecified     REVIEW OF SYSTEMS:   ROS As per history of present illness. All pertinent systems were reviewed above. Constitutional, HEENT, cardiovascular, respiratory, GI, GU, musculoskeletal, neuro, psychiatric, endocrine, integumentary and hematologic systems were reviewed and are otherwise negative/unremarkable except for positive findings mentioned above in the HPI.   MEDICATIONS AT HOME:   Prior to Admission medications   Medication Sig Start Date End Date Taking? Authorizing Provider  aspirin EC 81 MG tablet Take 1 tablet (81 mg total) by mouth daily. Swallow whole. 01/05/23   Dunn, Tacey Ruiz, PA-C  cefdinir (OMNICEF) 300 MG capsule Take 1 capsule (300 mg total) by mouth every 12 (twelve) hours. 07/08/23   Catarina Hartshorn, MD  Cholecalciferol (VITAMIN D3) 5000 units CAPS Take 1 capsule (5,000 Units total) by mouth daily. 04/15/17   Roma Kayser, MD  gabapentin (NEURONTIN) 300 MG capsule Take 1 capsule (300 mg total) by mouth 2 (two) times daily. Take 300 mg three times daily Patient taking differently: Take 300-600 mg by mouth 2 (two) times daily. Take 600 mg (2 capsules) by mouth every morning and 300 mg (1 capsule) every evening. 05/05/23   Vassie Loll, MD  metFORMIN (GLUCOPHAGE) 500 MG tablet Take 500 mg by mouth 2 (two) times daily. 06/26/23   [provider]  metoprolol succinate (TOPROL-XL) 50 MG 24 hr tablet Take 50 mg by mouth at bedtime. 04/01/23   [provider]  sertraline (ZOLOFT) 25 MG tablet Take 25 mg by mouth daily. Take with 50mg  dose to equal 75mg  daily.    [provider]  sertraline (ZOLOFT) 50 MG tablet Take 50 mg by mouth daily. Take with 25mg  dose to equal 75mg  daily.     [provider]  silodosin (RAPAFLO) 8 MG CAPS capsule Take 1 capsule (8 mg total) by mouth daily with breakfast. 10/10/22   Summerlin, Regan Rakers, PA-C  simvastatin (ZOCOR) 20 MG tablet Take 20 mg by mouth every evening. 06/24/23   [provider]  ticagrelor (BRILINTA) 90 MG TABS tablet Take 1 tablet (90 mg total) by mouth 2 (two) times daily. 01/04/23   Dunn, Tacey Ruiz, PA-C  Trospium Chloride 60 MG CP24 Take 1 capsule by mouth daily. 04/01/23   [provider]      VITAL SIGNS:  Blood pressure (!) 120/58, pulse 63, temperature 98.2 F (36.8 C), temperature source Oral, resp. rate 12, height 5\' 7"  (1.702 m), weight 67.1 kg, SpO2 98%.  PHYSICAL EXAMINATION:  Physical Exam  GENERAL:  89 y.o.-year-old patient lying in the bed with no acute distress.  EYES: Pupils equal, round, reactive to light and accommodation. No scleral icterus.  Extraocular muscles intact.  HEENT: Head atraumatic, normocephalic. Oropharynx and nasopharynx clear.  NECK:  Supple, no jugular venous distention. No thyroid enlargement, no tenderness.  LUNGS: Normal breath sounds bilaterally, no wheezing, rales,rhonchi or crepitation. No use of accessory muscles of respiration.  CARDIOVASCULAR: Regular rate and rhythm, S1, S2 normal. No murmurs, rubs, or gallops.  ABDOMEN: Soft, nondistended, nontender. Bowel sounds present. No organomegaly or mass.  EXTREMITIES: No pedal edema, cyanosis, or clubbing.  NEUROLOGIC: Cranial nerves II through XII are intact. Muscle strength 5/5 in all extremities. Sensation intact. Gait not checked.  PSYCHIATRIC: The patient is alert and oriented x 3.  Normal affect and good eye contact. SKIN: No obvious rash, lesion, or ulcer.   LABORATORY PANEL:   CBC Recent Labs  Lab 07/10/23 1727 07/10/23 1831  WBC 8.3  --   HGB 11.1* 11.6*  HCT 34.4* 34.0*  PLT 201  --     ------------------------------------------------------------------------------------------------------------------  Chemistries  Recent Labs  Lab 07/08/23 0526 07/10/23 1727 07/10/23 1831  NA 138 133* 136  K 3.1* 4.2 4.8  CL 106 104 103  CO2 23 24  --   GLUCOSE 197* 226* 210*  BUN 14 23 28*  CREATININE 0.67 0.81 0.80  CALCIUM 9.0 8.9  --   MG 2.2  --   --   AST  --  14*  --   ALT  --  12  --   ALKPHOS  --  72  --   BILITOT  --  0.6  --    ------------------------------------------------------------------------------------------------------------------  Cardiac Enzymes No results for input(s): "TROPONINI" in the last 168 hours. ------------------------------------------------------------------------------------------------------------------  RADIOLOGY:  CT Head Wo Contrast  Result Date: 07/10/2023 CLINICAL DATA:  Altered mental status. EXAM: CT HEAD WITHOUT CONTRAST TECHNIQUE: Contiguous axial images were obtained from the base of the skull through the vertex without intravenous contrast. RADIATION DOSE REDUCTION: This exam was performed according to the departmental dose-optimization program which includes automated exposure control, adjustment of the mA and/or kV according to patient size and/or use of iterative reconstruction technique. COMPARISON:  July 06, 2023 FINDINGS: Brain: There is moderate severity cerebral atrophy with widening of the extra-axial spaces and ventricular dilatation. There are areas of decreased attenuation within the white matter tracts of the supratentorial brain, consistent with microvascular disease changes. Vascular: Marked severity bilateral cavernous carotid artery calcification is seen. Skull: Normal. Negative for fracture or focal lesion. Sinuses/Orbits: No acute finding. Other: None. IMPRESSION: 1. Generalized cerebral atrophy with chronic white matter small vessel ischemic changes. 2. No acute intracranial abnormality. Electronically Signed   By:  Aram Candela M.D.   On: 07/10/2023 20:10   DG Hand Complete Left  Result Date: 07/10/2023 CLINICAL DATA:  Question of sepsis to evaluate for abnormality. Pain and swelling of the wrist. EXAM: LEFT HAND - COMPLETE 3+ VIEW COMPARISON:  None. FINDINGS: Degenerative changes in the STT and radiocarpal joints as well as at the first carpometacarpal and interphalangeal joints. Calcifications in the radiocarpal space, scapholunate space and triangular fibrocartilage space, likely ligamentous calcification. Mild soft tissue swelling over the distal forearm. No evidence of acute fracture or dislocation. No focal bone lesions. No bone erosion or sclerosis to suggest osteomyelitis. Vascular calcifications. IMPRESSION: Degenerative changes in the left hand and wrist with likely ligamentous calcifications. No acute bony abnormalities. Electronically Signed   By: Burman Nieves M.D.   On: 07/10/2023 18:18   DG Chest Port 1 View  Result Date: 07/10/2023 CLINICAL DATA:  Question of sepsis to evaluate  for abnormality. EXAM: PORTABLE CHEST 1 VIEW COMPARISON:  05/01/2023 FINDINGS: Heart size and pulmonary vascularity are normal. Lungs are clear. No pleural effusions. No pneumothorax. Mediastinal contours appear intact. Degenerative changes in the spine and shoulders. Postoperative change in the right shoulder. IMPRESSION: No active disease. Electronically Signed   By: Burman Nieves M.D.   On: 07/10/2023 18:16   DG Wrist Complete Right  Result Date: 07/10/2023 CLINICAL DATA:  Question of sepsis. Evaluate for abnormality. Right wrist is swollen and warm to touch. EXAM: RIGHT WRIST - COMPLETE 3+ VIEW COMPARISON:  None Available. FINDINGS: Diffuse bone demineralization. Degenerative changes involving the radiocarpal and STT joints. Calcifications in the right wrist in the scapholunate and radiocarpal row as well as in the region of the triangular fibrocartilage and dorsal joint. These changes may represent ligamentous  calcification or degenerative calcification. Loose bodies may be present. No bone destruction or cortical erosion to suggest evidence of osteomyelitis. Diffuse soft tissue swelling. Vascular calcifications. IMPRESSION: Diffuse demineralization and degenerative changes in the right wrist. Soft tissue calcifications may represent ligamentous calcification or degenerative calcification with possible loose bodies. No evidence of osteomyelitis. Soft tissue swelling. Electronically Signed   By: Burman Nieves M.D.   On: 07/10/2023 18:16      IMPRESSION AND PLAN:  Assessment and Plan: * Acute metabolic encephalopathy - This is of unclear etiology. - It could be related to persistent UTI and her missing of antibiotic doses. - We will follow neurochecks every 4 hours for 24 hours. - Management otherwise as below.  Acute lower UTI - We will place her for now on IV Rocephin and follow urine culture. - Her last urine culture was negative on 07/05/2023. - She had grown Enterobacter, low ACE on 05/01/2023 and Proteus Mirabilis on 02/11/2023.  Type 2 diabetes mellitus with peripheral neuropathy (HCC) - The patient will be placed on supplemental coverage with NovoLog. - We will hold off metformin. - We will continue Neurontin.  CAD (coronary artery disease) of artery bypass graft - We will continue statin therapy, Toprol-XL, aspirin and Brilinta.  Dyslipidemia - We will continue statin therapy.  Depression - We will continue Zoloft.   DVT prophylaxis: Lovenox.  Advanced Care Planning:  Code Status: The patient is DNR and DNI.[name]  Family Communication:  The plan of care was discussed in details with the patient (and family). I answered all questions. The patient agreed to proceed with the above mentioned plan. Further management will depend upon hospital course. Disposition Plan: Back to previous home environment Consults called: none.  All the records are reviewed and case discussed with ED  provider.  Status is: Observation  I certify that at the time of admission, it is my clinical judgment that the patient will require hospital care extending less than 2 midnights.                            Dispo: The patient is from: Home              Anticipated d/c is to: Home              Patient currently is not medically stable to d/c.              Difficult to place patient: No  Hannah Beat M.D on 07/11/2023 at 12:43 AM  [facility]   From 7 PM-7 AM, contact night-coverage [email or web address]  CC: Primary care physician; Assunta Found, MD

## 2023-07-10 NOTE — Assessment & Plan Note (Signed)
 -   We will continue Zoloft 

## 2023-07-10 NOTE — Assessment & Plan Note (Signed)
-   We will place her for now on IV Rocephin and follow urine culture. - Her last urine culture was negative on 07/05/2023. - She had grown Enterobacter, low ACE on 05/01/2023 and Proteus Mirabilis on 02/11/2023.

## 2023-07-10 NOTE — Assessment & Plan Note (Addendum)
 -   We will continue statin therapy. 

## 2023-07-10 NOTE — ED Notes (Signed)
..ED TO INPATIENT HANDOFF REPORT  ED Nurse Name and Phone #: Millard Bautch P  S Name/Age/Gender [name] July 89 y.o. female Room/Bed: APA03/APA03  Code Status   Code Status: DNR  Home/SNF/Other   Patient oriented to: self and place Is this baseline? No   Triage Complete: Triage complete  Chief Complaint Acute metabolic encephalopathy [G93.41]  Triage Note Pt brought in by [facility] from home with c/o AMS. Pt was recently diagnosed with UTI but hasn't been taking her antibiotic due to some confusion of her children not reporting to the other one to administer it. Pt has only taken 1 of 2 doses each day for the last 2 days. Pt c/o pain to right wrist which is swollen and warm to the touch.    Allergies Allergies  Allergen Reactions   Codeine Nausea And Vomiting   Glipizide     Hypoglycemia with glucoses recorded in the 20s necessitating glucagon and D10 IV   Sulfa Antibiotics Other (See Comments)    Unspecified     Level of Care/Admitting Diagnosis ED Disposition     ED Disposition  Admit   Condition  --   Comment  Hospital Area: [facility] [[id number]]  Level of Care: Telemetry [5]  Covid Evaluation: Asymptomatic - no recent exposure (last 10 days) testing not required  Diagnosis: Acute metabolic encephalopathy [1610960]  Admitting Physician: Hannah Beat [[id number]]  Attending Physician: Hannah Beat [[id number]]          B Medical/Surgery History Past Medical History:  Diagnosis Date   CAD (coronary artery disease)    a. 12/2022 Inf STEMI/PCI: LM nl, LAD mild diff dzs, D1 50, D2 50., LCX 75d, OM1 60, RCA 90p (2.5x20 Synergy XD DES), 100d (PTCA-->90 residual).   Diabetes mellitus with neuropathy    Diabetic neuropathy (HCC)    Diastolic dysfunction    a. 12/2022 Echo: EF 60-65%, no rwma, GrI DD, nl RV size/fxn, triv MR.   Hypertension    Stroke [facility])    Urinary incontinence    Past Surgical History:  Procedure Laterality Date   ABDOMINAL HYSTERECTOMY      APPENDECTOMY     BREAST SURGERY     begnin tumor removed   CATARACT EXTRACTION, BILATERAL     CORONARY ANGIOGRAPHY N/A 12/31/2022   Procedure: CORONARY ANGIOGRAPHY;  Surgeon: Yvonne Kendall, MD;  Location: [facility] INVASIVE CV LAB;  Service: Cardiovascular;  Laterality: N/A;   CORONARY/GRAFT ACUTE MI REVASCULARIZATION N/A 12/31/2022   Procedure: Coronary/Graft Acute MI Revascularization;  Surgeon: Yvonne Kendall, MD;  Location: [facility] INVASIVE CV LAB;  Service: Cardiovascular;  Laterality: N/A;     A IV Location/Drains/Wounds Patient Lines/Drains/Airways Status     Active Line/Drains/Airways     Name Placement date Placement time Site Days   Peripheral IV 07/10/23 22 G 1" Left Antecubital 07/10/23  1819  Antecubital  less than 1   Peripheral IV 07/10/23 20 G Anterior;Left Forearm 07/10/23  1844  Forearm  less than 1   Pressure Injury 02/11/23 Buttocks Left Stage 2 -  Partial thickness loss of dermis presenting as a shallow open injury with a red, pink wound bed without slough. 0.5 cm x 3 cm x 0 cm, shallow pink base, looks like prior site of skin tear 02/11/23  1200  -- 149   Pressure Injury 02/11/23 Buttocks Right Stage 2 -  Partial thickness loss of dermis presenting as a shallow open injury with a red, pink wound bed without slough. 0.5cm x 1 cm -  shallow pink base, no drainage 02/11/23  1200  -- 149            Intake/Output Last 24 hours  Intake/Output Summary (Last 24 hours) at 07/10/2023 2215 Last data filed at 07/10/2023 2027 Gross per 24 hour  Intake 197.15 ml  Output --  Net 197.15 ml    Labs/Imaging Results for orders placed or performed during the hospital encounter of 07/10/23 (from the past 48 hour(s))  Lactic acid, plasma     Status: None   Collection Time: 07/10/23  5:27 PM  Result Value Ref Range   Lactic Acid, Venous 1.5 0.5 - 1.9 mmol/L    Comment: Performed at [facility], [address], [place], [place] [address]  Comprehensive metabolic panel     Status:  Abnormal   Collection Time: 07/10/23  5:27 PM  Result Value Ref Range   Sodium 133 (L) 135 - 145 mmol/L   Potassium 4.2 3.5 - 5.1 mmol/L   Chloride 104 98 - 111 mmol/L   CO2 24 22 - 32 mmol/L   Glucose, Bld 226 (H) 70 - 99 mg/dL    Comment: Glucose reference range applies only to samples taken after fasting for at least 8 hours.   BUN 23 8 - 23 mg/dL   Creatinine, Ser 9.37 0.44 - 1.00 mg/dL   Calcium 8.9 8.9 - 16.9 mg/dL   Total Protein 5.8 (L) 6.5 - 8.1 g/dL   Albumin 2.7 (L) 3.5 - 5.0 g/dL   AST 14 (L) 15 - 41 U/L   ALT 12 0 - 44 U/L   Alkaline Phosphatase 72 38 - 126 U/L   Total Bilirubin 0.6 0.3 - 1.2 mg/dL   GFR, Estimated >67 >89 mL/min    Comment: (NOTE) Calculated using the CKD-EPI Creatinine Equation (2021)    Anion gap 5 5 - 15    Comment: Performed at [facility], [address], [place], [place] [address]  CBC with Differential     Status: Abnormal   Collection Time: 07/10/23  5:27 PM  Result Value Ref Range   WBC 8.3 4.0 - 10.5 K/uL   RBC 3.90 3.87 - 5.11 MIL/uL   Hemoglobin 11.1 (L) 12.0 - 15.0 g/dL   HCT 75.1 (L) 02.5 - 85.2 %   MCV 88.2 80.0 - 100.0 fL   MCH 28.5 26.0 - 34.0 pg   MCHC 32.3 30.0 - 36.0 g/dL   RDW 77.8 24.2 - 35.3 %   Platelets 201 150 - 400 K/uL   nRBC 0.0 0.0 - 0.2 %   Neutrophils Relative % 63 %   Neutro Abs 5.3 1.7 - 7.7 K/uL   Lymphocytes Relative 22 %   Lymphs Abs 1.8 0.7 - 4.0 K/uL   Monocytes Relative 12 %   Monocytes Absolute 1.0 0.1 - 1.0 K/uL   Eosinophils Relative 2 %   Eosinophils Absolute 0.2 0.0 - 0.5 K/uL   Basophils Relative 1 %   Basophils Absolute 0.0 0.0 - 0.1 K/uL   Immature Granulocytes 0 %   Abs Immature Granulocytes 0.02 0.00 - 0.07 K/uL    Comment: Performed at [facility], [address] [place], [place] [address]  Protime-INR     Status: None   Collection Time: 07/10/23  5:27 PM  Result Value Ref Range   Prothrombin Time 13.5 11.4 - 15.2 seconds   INR 1.0 0.8 - 1.2    Comment: (NOTE) INR goal varies  based on device and disease states. Performed at [facility], [address], [place], [place] [address]   APTT     Status: None   Collection Time: 07/10/23  5:27 PM  Result Value Ref Range   aPTT 26 24 - 36 seconds    Comment: Performed at [facility], [address] [place], [place] [address]  Troponin I (High Sensitivity)     Status: None   Collection Time: 07/10/23  5:27 PM  Result Value Ref Range   Troponin I (High Sensitivity) 12 <18 ng/L    Comment: (NOTE) Elevated high sensitivity troponin I (hsTnI) values and significant  changes across serial measurements may suggest ACS but many other  chronic and acute conditions are known to elevate hsTnI results.  Refer to the "Links" section for chest pain algorithms and additional  guidance. Performed at [facility], [address] [place], [place] [address]   Resp panel by RT-PCR (RSV, Flu A&B, Covid) Anterior Nasal Swab     Status: None   Collection Time: 07/10/23  6:18 PM   Specimen: Anterior Nasal Swab  Result Value Ref Range   SARS Coronavirus 2 by RT PCR NEGATIVE NEGATIVE    Comment: (NOTE) SARS-CoV-2 target nucleic acids are NOT DETECTED.  The SARS-CoV-2 RNA is generally detectable in upper respiratory specimens during the acute phase of infection. The lowest concentration of SARS-CoV-2 viral copies this assay can detect is 138 copies/mL. A negative result does not preclude SARS-Cov-2 infection and should not be used as the sole basis for treatment or other patient management decisions. A negative result may occur with  improper specimen collection/handling, submission of specimen other than nasopharyngeal swab, presence of viral mutation(s) within the areas targeted by this assay, and inadequate number of viral copies(<138 copies/mL). A negative result must be combined with clinical observations, patient history, and epidemiological information. The expected result is Negative.  Fact Sheet for Patients:   BloggerCourse.com  Fact Sheet for Healthcare Providers:  SeriousBroker.it  This test is no t yet approved or cleared by the [place] FDA and  has been authorized for detection and/or diagnosis of SARS-CoV-2 by FDA under an Emergency Use Authorization (EUA). This EUA will remain  in effect (meaning this test can be used) for the duration of the COVID-19 declaration under Section 564(b)(1) of the Act, 21 U.S.C.section 360bbb-3(b)(1), unless the authorization is terminated  or revoked sooner.       Influenza A by PCR NEGATIVE NEGATIVE   Influenza B by PCR NEGATIVE NEGATIVE    Comment: (NOTE) The Xpert Xpress SARS-CoV-2/FLU/RSV plus assay is intended as an aid in the diagnosis of influenza from Nasopharyngeal swab specimens and should not be used as a sole basis for treatment. Nasal washings and aspirates are unacceptable for Xpert Xpress SARS-CoV-2/FLU/RSV testing.  Fact Sheet for Patients: BloggerCourse.com  Fact Sheet for Healthcare Providers: SeriousBroker.it  This test is not yet approved or cleared by the [place] FDA and has been authorized for detection and/or diagnosis of SARS-CoV-2 by FDA under an Emergency Use Authorization (EUA). This EUA will remain in effect (meaning this test can be used) for the duration of the COVID-19 declaration under Section 564(b)(1) of the Act, 21 U.S.C. section 360bbb-3(b)(1), unless the authorization is terminated or revoked.     Resp Syncytial Virus by PCR NEGATIVE NEGATIVE    Comment: (NOTE) Fact Sheet for Patients: BloggerCourse.com  Fact Sheet for Healthcare Providers: SeriousBroker.it  This test is not yet approved or cleared by the [place] FDA and has been authorized for detection  and/or diagnosis of SARS-CoV-2 by FDA under an Emergency Use Authorization (EUA).  This EUA will remain in effect (meaning this test can be used) for the duration of the COVID-19 declaration under Section 564(b)(1) of the Act, 21 U.S.C. section 360bbb-3(b)(1), unless the authorization is terminated or revoked.  Performed at [facility], [address], [place], [place] [address]   Blood Culture (routine x 2)     Status: None (Preliminary result)   Collection Time: 07/10/23  6:18 PM   Specimen: BLOOD  Result Value Ref Range   Specimen Description BLOOD BLOOD LEFT ARM    Special Requests      BOTTLES DRAWN AEROBIC AND ANAEROBIC Blood Culture results may not be optimal due to an excessive volume of blood received in culture bottles Performed at [facility], [address], [place], [place] [address]    Culture PENDING    Report Status PENDING   I-stat chem 8, ED     Status: Abnormal   Collection Time: 07/10/23  6:31 PM  Result Value Ref Range   Sodium 136 135 - 145 mmol/L   Potassium 4.8 3.5 - 5.1 mmol/L   Chloride 103 98 - 111 mmol/L   BUN 28 (H) 8 - 23 mg/dL   Creatinine, Ser 0.98 0.44 - 1.00 mg/dL   Glucose, Bld 119 (H) 70 - 99 mg/dL    Comment: Glucose reference range applies only to samples taken after fasting for at least 8 hours.   Calcium, Ion 1.25 1.15 - 1.40 mmol/L   TCO2 26 22 - 32 mmol/L   Hemoglobin 11.6 (L) 12.0 - 15.0 g/dL   HCT 14.7 (L) 82.9 - 56.2 %  Lactic acid, plasma     Status: None   Collection Time: 07/10/23  7:01 PM  Result Value Ref Range   Lactic Acid, Venous 1.5 0.5 - 1.9 mmol/L    Comment: Performed at [facility], [address], [place], [place] [address]  Troponin I (High Sensitivity)     Status: None   Collection Time: 07/10/23  7:47 PM  Result Value Ref Range   Troponin I (High Sensitivity) 11 <18 ng/L    Comment: (NOTE) Elevated high sensitivity troponin I (hsTnI) values and significant  changes across serial measurements may suggest ACS but many other  chronic and acute conditions are known to elevate hsTnI results.   Refer to the "Links" section for chest pain algorithms and additional  guidance. Performed at [facility], [address] [place], [place] [address]   Urinalysis, w/ Reflex to Culture (Infection Suspected) -Urine, Clean Catch     Status: Abnormal   Collection Time: 07/10/23  8:22 PM  Result Value Ref Range   Specimen Source URINE, CATHETERIZED     Comment: CORRECTED ON 07/17 AT 2026: PREVIOUSLY REPORTED AS URINE, CLEAN CATCH   Color, Urine YELLOW YELLOW   APPearance HAZY (A) CLEAR   Specific Gravity, Urine 1.017 1.005 - 1.030   pH 5.0 5.0 - 8.0   Glucose, UA 150 (A) NEGATIVE mg/dL   Hgb urine dipstick SMALL (A) NEGATIVE   Bilirubin Urine NEGATIVE NEGATIVE   Ketones, ur NEGATIVE NEGATIVE mg/dL   Protein, ur NEGATIVE NEGATIVE mg/dL   Nitrite NEGATIVE NEGATIVE   Leukocytes,Ua TRACE (A) NEGATIVE   RBC / HPF 0-5 0 - 5 RBC/hpf   WBC, UA 6-10 0 - 5 WBC/hpf    Comment:        Reflex urine culture not performed if WBC <=10, OR if Squamous epithelial  cells >5. If Squamous epithelial cells >5 suggest recollection.    Bacteria, UA RARE (A) NONE SEEN   Squamous Epithelial / HPF 6-10 0 - 5 /HPF   Mucus PRESENT    Budding Yeast PRESENT     Comment: Performed at [facility], [address], [place], [place] [address]   CT Head Wo Contrast  Result Date: 07/10/2023 CLINICAL DATA:  Altered mental status. EXAM: CT HEAD WITHOUT CONTRAST TECHNIQUE: Contiguous axial images were obtained from the base of the skull through the vertex without intravenous contrast. RADIATION DOSE REDUCTION: This exam was performed according to the departmental dose-optimization program which includes automated exposure control, adjustment of the mA and/or kV according to patient size and/or use of iterative reconstruction technique. COMPARISON:  July 06, 2023 FINDINGS: Brain: There is moderate severity cerebral atrophy with widening of the extra-axial spaces and ventricular dilatation. There are areas of decreased  attenuation within the white matter tracts of the supratentorial brain, consistent with microvascular disease changes. Vascular: Marked severity bilateral cavernous carotid artery calcification is seen. Skull: Normal. Negative for fracture or focal lesion. Sinuses/Orbits: No acute finding. Other: None. IMPRESSION: 1. Generalized cerebral atrophy with chronic white matter small vessel ischemic changes. 2. No acute intracranial abnormality. Electronically Signed   By: Aram Candela M.D.   On: 07/10/2023 20:10   DG Hand Complete Left  Result Date: 07/10/2023 CLINICAL DATA:  Question of sepsis to evaluate for abnormality. Pain and swelling of the wrist. EXAM: LEFT HAND - COMPLETE 3+ VIEW COMPARISON:  None. FINDINGS: Degenerative changes in the STT and radiocarpal joints as well as at the first carpometacarpal and interphalangeal joints. Calcifications in the radiocarpal space, scapholunate space and triangular fibrocartilage space, likely ligamentous calcification. Mild soft tissue swelling over the distal forearm. No evidence of acute fracture or dislocation. No focal bone lesions. No bone erosion or sclerosis to suggest osteomyelitis. Vascular calcifications. IMPRESSION: Degenerative changes in the left hand and wrist with likely ligamentous calcifications. No acute bony abnormalities. Electronically Signed   By: Burman Nieves M.D.   On: 07/10/2023 18:18   DG Chest Port 1 View  Result Date: 07/10/2023 CLINICAL DATA:  Question of sepsis to evaluate for abnormality. EXAM: PORTABLE CHEST 1 VIEW COMPARISON:  05/01/2023 FINDINGS: Heart size and pulmonary vascularity are normal. Lungs are clear. No pleural effusions. No pneumothorax. Mediastinal contours appear intact. Degenerative changes in the spine and shoulders. Postoperative change in the right shoulder. IMPRESSION: No active disease. Electronically Signed   By: Burman Nieves M.D.   On: 07/10/2023 18:16   DG Wrist Complete Right  Result Date:  07/10/2023 CLINICAL DATA:  Question of sepsis. Evaluate for abnormality. Right wrist is swollen and warm to touch. EXAM: RIGHT WRIST - COMPLETE 3+ VIEW COMPARISON:  None Available. FINDINGS: Diffuse bone demineralization. Degenerative changes involving the radiocarpal and STT joints. Calcifications in the right wrist in the scapholunate and radiocarpal row as well as in the region of the triangular fibrocartilage and dorsal joint. These changes may represent ligamentous calcification or degenerative calcification. Loose bodies may be present. No bone destruction or cortical erosion to suggest evidence of osteomyelitis. Diffuse soft tissue swelling. Vascular calcifications. IMPRESSION: Diffuse demineralization and degenerative changes in the right wrist. Soft tissue calcifications may represent ligamentous calcification or degenerative calcification with possible loose bodies. No evidence of osteomyelitis. Soft tissue swelling. Electronically Signed   By: Burman Nieves M.D.   On: 07/10/2023 18:16    Pending Labs Unresulted Labs (From admission, onward)  Start     Ordered   07/11/23 0500  Basic metabolic panel  Tomorrow morning,   R        07/10/23 2152   07/11/23 0500  CBC  Tomorrow morning,   R        07/10/23 2152   07/10/23 2106  Urine Culture  Once,   URGENT       Question:  Indication  Answer:  Altered mental status (if no other cause identified)   07/10/23 2105   07/10/23 1731  Blood Culture (routine x 2)  (Septic presentation on arrival (screening labs, nursing and treatment orders for obvious sepsis))  BLOOD CULTURE X 2,   STAT      07/10/23 1733            Vitals/Pain Today's Vitals   07/10/23 2030 07/10/23 2045 07/10/23 2100 07/10/23 2115  BP: 135/63 139/67 110/80 124/60  Pulse: 70 68 63 64  Resp: 14 10 14 12   Temp:      TempSrc:      SpO2: 98% 100% 100% 98%  Weight:      Height:      PainSc:        Isolation Precautions No active  isolations  Medications Medications  ceFEPIme (MAXIPIME) 2 g in sodium chloride 0.9 % 100 mL IVPB (has no administration in time range)  vancomycin (VANCOREADY) IVPB 500 mg/100 mL (has no administration in time range)  vancomycin (VANCOREADY) IVPB 1250 mg/250 mL (has no administration in time range)  0.9 %  sodium chloride infusion ( Intravenous New Bag/Given 07/10/23 1819)  aspirin EC tablet 81 mg (has no administration in time range)  metoprolol succinate (TOPROL-XL) 24 hr tablet 50 mg (has no administration in time range)  simvastatin (ZOCOR) tablet 20 mg (has no administration in time range)  sertraline (ZOLOFT) tablet 75 mg (has no administration in time range)  tamsulosin (FLOMAX) capsule 0.4 mg (has no administration in time range)  ticagrelor (BRILINTA) tablet 90 mg (has no administration in time range)  cholecalciferol (VITAMIN D3) tablet 5,000 Units (has no administration in time range)  enoxaparin (LOVENOX) injection 40 mg (has no administration in time range)  0.9 %  sodium chloride infusion (has no administration in time range)  acetaminophen (TYLENOL) tablet 650 mg (has no administration in time range)    Or  acetaminophen (TYLENOL) suppository 650 mg (has no administration in time range)  traZODone (DESYREL) tablet 25 mg (has no administration in time range)  magnesium hydroxide (MILK OF MAGNESIA) suspension 30 mL (has no administration in time range)  ondansetron (ZOFRAN) tablet 4 mg (has no administration in time range)    Or  ondansetron (ZOFRAN) injection 4 mg (has no administration in time range)  gabapentin (NEURONTIN) capsule 300 mg (has no administration in time range)  gabapentin (NEURONTIN) capsule 600 mg (has no administration in time range)  ceFEPIme (MAXIPIME) 2 g in sodium chloride 0.9 % 100 mL IVPB (0 g Intravenous Stopped 07/10/23 1902)  metroNIDAZOLE (FLAGYL) IVPB 500 mg (0 mg Intravenous Stopped 07/10/23 2026)  vancomycin (VANCOCIN) IVPB 1000 mg/200 mL  premix (1,000 mg Intravenous New Bag/Given 07/10/23 2032)    Mobility non-ambulatory     Focused Assessments Neuro Assessment Handoff:  Swallow screen pass? Yes  Cardiac Rhythm: Normal sinus rhythm    Neuro Assessment:   Neuro Checks:      Has TPA been given? No  R Recommendations: See Admitting Provider Note  Report given to:   Additional Notes:

## 2023-07-10 NOTE — Assessment & Plan Note (Signed)
-   The patient will be placed on supplemental coverage with NovoLog. - We will hold off metformin. - We will continue Neurontin. 

## 2023-07-10 NOTE — ED Notes (Signed)
ED Provider at bedside. 

## 2023-07-10 NOTE — Assessment & Plan Note (Signed)
-   We will continue statin therapy, Toprol-XL, aspirin and Brilinta.

## 2023-07-10 NOTE — Progress Notes (Signed)
Pharmacy Antibiotic Note  [name] is a 89 y.o. female admitted on 07/10/2023 with sepsis.  Pharmacy has been consulted for Vanc and cefepime dosing.   Plan: Cefepime 2 gms IV q12hrs Vanc 1500 mg IV x 1 then 1250 mg IV q24hr Monitor renal function, clinical status, C&S and vanc levels as needed  Height: 5\' 7"  (170.2 cm) Weight: 73 kg (160 lb 15 oz) IBW/kg (Calculated) : 61.6  Temp (24hrs), Avg:95.7 F (35.4 C), Min:95.7 F (35.4 C), Max:95.7 F (35.4 C)  Recent Labs  Lab 07/04/23 2018 07/04/23 2052 07/04/23 2138 07/05/23 0522 07/06/23 0458 07/08/23 0526  WBC  --  10.0  --  7.7 9.0 7.9  CREATININE 0.97  --   --  0.80 0.76 0.67  LATICACIDVEN  --   --  1.7  --   --   --     Estimated Creatinine Clearance: 44.5 mL/min (by C-G formula based on SCr of 0.67 mg/dL).    Allergies  Allergen Reactions   Codeine Nausea And Vomiting   Glipizide     Hypoglycemia with glucoses recorded in the 20s necessitating glucagon and D10 IV   Sulfa Antibiotics Other (See Comments)    Unspecified      Thank you for allowing pharmacy to be a part of this patient's care.  Jeanella Cara, PharmD, [facility] Clinical Pharmacist Please see AMION for all Pharmacists' Contact Phone Numbers 07/10/2023, 5:43 PM

## 2023-07-10 NOTE — ED Provider Notes (Signed)
EMERGENCY DEPARTMENT AT [facility] Provider Note   CSN: [id number] Arrival date & time: 07/10/23  1657     History  Chief Complaint  Patient presents with   Altered Mental Status    [name] is a 89 y.o. female brought in for altered mental status.  Patient was discharged from the hospital on 07/08/2023 after being admitted with Proteus UTI and fever.  During that time her daughter reports that they signed DNR paperwork however the patient saw all the DNR paper on the refrigerator after she was discharged and became "pissed" that her family had side DNR status and reportedly was very angry with family they decided this.  Given that she was at her baseline mental status at that time she is likely full code.  The patient currently is very confused stating that it is 1972.  Her daughter reports that she missed 2 doses of her oral antibiotics over the past 2 days.  Today her home health no nurse noted that she was lethargic and confused and that she had severe pain and swelling in her right wrist.  No known injuries.  Patient is unable to give any further information due to altered mental status.  She arrived with a rectal temperature of 95.7 and was immediately placed on code sepsis status.   Altered Mental Status      Home Medications Prior to Admission medications   Medication Sig Start Date End Date Taking? Authorizing Provider  aspirin EC 81 MG tablet Take 1 tablet (81 mg total) by mouth daily. Swallow whole. 01/05/23   Dunn, Tacey Ruiz, PA-C  cefdinir (OMNICEF) 300 MG capsule Take 1 capsule (300 mg total) by mouth every 12 (twelve) hours. 07/08/23   Catarina Hartshorn, MD  Cholecalciferol (VITAMIN D3) 5000 units CAPS Take 1 capsule (5,000 Units total) by mouth daily. 04/15/17   Roma Kayser, MD  gabapentin (NEURONTIN) 300 MG capsule Take 1 capsule (300 mg total) by mouth 2 (two) times daily. Take 300 mg three times daily Patient taking differently: Take  300-600 mg by mouth 2 (two) times daily. Take 600 mg (2 capsules) by mouth every morning and 300 mg (1 capsule) every evening. 05/05/23   Vassie Loll, MD  metFORMIN (GLUCOPHAGE) 500 MG tablet Take 500 mg by mouth 2 (two) times daily. 06/26/23   [provider]  metoprolol succinate (TOPROL-XL) 50 MG 24 hr tablet Take 50 mg by mouth at bedtime. 04/01/23   [provider]  sertraline (ZOLOFT) 25 MG tablet Take 25 mg by mouth daily. Take with 50mg  dose to equal 75mg  daily.    [provider]  sertraline (ZOLOFT) 50 MG tablet Take 50 mg by mouth daily. Take with 25mg  dose to equal 75mg  daily.    [provider]  silodosin (RAPAFLO) 8 MG CAPS capsule Take 1 capsule (8 mg total) by mouth daily with breakfast. 10/10/22   Summerlin, Regan Rakers, PA-C  simvastatin (ZOCOR) 20 MG tablet Take 20 mg by mouth every evening. 06/24/23   [provider]  ticagrelor (BRILINTA) 90 MG TABS tablet Take 1 tablet (90 mg total) by mouth 2 (two) times daily. 01/04/23   Dunn, Tacey Ruiz, PA-C  Trospium Chloride 60 MG CP24 Take 1 capsule by mouth daily. 04/01/23   [provider]      Allergies    Codeine, Glipizide, and Sulfa antibiotics    Review of Systems   Review of Systems  Physical Exam Updated Vital Signs BP [name])  141/66   Pulse 77   Temp 98.3 F (36.8 C) (Oral)   Resp 14   Ht 5\' 7"  (1.702 m)   Wt 73 kg   SpO2 98%   BMI 25.21 kg/m  Physical Exam Vitals and nursing note reviewed.  Constitutional:      General: She is not in acute distress.    Appearance: She is well-developed. She is not diaphoretic.  HENT:     Head: Normocephalic and atraumatic.     Right Ear: External ear normal.     Left Ear: External ear normal.     Nose: Nose normal.     Mouth/Throat:     Mouth: Mucous membranes are moist.  Eyes:     General: No scleral icterus.    Conjunctiva/sclera: Conjunctivae normal.  Cardiovascular:     Rate and Rhythm: Normal rate and regular rhythm.      Heart sounds: Normal heart sounds. No murmur heard.    No friction rub. No gallop.  Pulmonary:     Effort: Pulmonary effort is normal. No respiratory distress.     Breath sounds: Normal breath sounds.  Abdominal:     General: Bowel sounds are normal. There is no distension.     Palpations: Abdomen is soft. There is no mass.     Tenderness: There is no abdominal tenderness. There is no guarding.  Musculoskeletal:     Cervical back: Normal range of motion.     Comments: Decubitus dressing over the sacrum.  There is no obvious signs of tissue breakdown.  She has blanchable tissue under the sacral pad. The right wrist is diffusely swollen and tender to palpation.  Range of motion is extremely limited.  There is bruising over the ulnar side of the left hand.  The remainder of the right elbow and shoulder examination is normal.  Skin:    General: Skin is warm and dry.  Neurological:     Mental Status: She is alert and oriented to person, place, and time.  Psychiatric:        Behavior: Behavior normal.     ED Results / Procedures / Treatments   Labs (all labs ordered are listed, but only abnormal results are displayed) Labs Reviewed  COMPREHENSIVE METABOLIC PANEL - Abnormal; Notable for the following components:      Result Value   Sodium 133 (*)    Glucose, Bld 226 (*)    Total Protein 5.8 (*)    Albumin 2.7 (*)    AST 14 (*)    All other components within normal limits  CBC WITH DIFFERENTIAL/PLATELET - Abnormal; Notable for the following components:   Hemoglobin 11.1 (*)    HCT 34.4 (*)    All other components within normal limits  I-STAT CHEM 8, ED - Abnormal; Notable for the following components:   BUN 28 (*)    Glucose, Bld 210 (*)    Hemoglobin 11.6 (*)    HCT 34.0 (*)    All other components within normal limits  RESP PANEL BY RT-PCR (RSV, FLU A&B, COVID)  RVPGX2  CULTURE, BLOOD (ROUTINE X 2)  CULTURE, BLOOD (ROUTINE X 2)  LACTIC ACID, PLASMA  LACTIC ACID, PLASMA   PROTIME-INR  APTT  URINALYSIS, W/ REFLEX TO CULTURE (INFECTION SUSPECTED)  TROPONIN I (HIGH SENSITIVITY)  TROPONIN I (HIGH SENSITIVITY)    EKG EKG Interpretation Date/Time:  Wednesday July 10 2023 17:05:54 EDT Ventricular Rate:  77 PR Interval:  170 QRS Duration:  122 QT Interval:  415 QTC Calculation: 470 R Axis:   -61  Text Interpretation: Sinus rhythm Right bundle branch block Anterolateral infarct, age indeterminate Confirmed by Vanetta Mulders [phone]) on 07/10/2023 5:38:04 PM  Radiology CT Head Wo Contrast  Result Date: 07/10/2023 CLINICAL DATA:  Altered mental status. EXAM: CT HEAD WITHOUT CONTRAST TECHNIQUE: Contiguous axial images were obtained from the base of the skull through the vertex without intravenous contrast. RADIATION DOSE REDUCTION: This exam was performed according to the departmental dose-optimization program which includes automated exposure control, adjustment of the mA and/or kV according to patient size and/or use of iterative reconstruction technique. COMPARISON:  July 06, 2023 FINDINGS: Brain: There is moderate severity cerebral atrophy with widening of the extra-axial spaces and ventricular dilatation. There are areas of decreased attenuation within the white matter tracts of the supratentorial brain, consistent with microvascular disease changes. Vascular: Marked severity bilateral cavernous carotid artery calcification is seen. Skull: Normal. Negative for fracture or focal lesion. Sinuses/Orbits: No acute finding. Other: None. IMPRESSION: 1. Generalized cerebral atrophy with chronic white matter small vessel ischemic changes. 2. No acute intracranial abnormality. Electronically Signed   By: Aram Candela M.D.   On: 07/10/2023 20:10   DG Hand Complete Left  Result Date: 07/10/2023 CLINICAL DATA:  Question of sepsis to evaluate for abnormality. Pain and swelling of the wrist. EXAM: LEFT HAND - COMPLETE 3+ VIEW COMPARISON:  None. FINDINGS: Degenerative  changes in the STT and radiocarpal joints as well as at the first carpometacarpal and interphalangeal joints. Calcifications in the radiocarpal space, scapholunate space and triangular fibrocartilage space, likely ligamentous calcification. Mild soft tissue swelling over the distal forearm. No evidence of acute fracture or dislocation. No focal bone lesions. No bone erosion or sclerosis to suggest osteomyelitis. Vascular calcifications. IMPRESSION: Degenerative changes in the left hand and wrist with likely ligamentous calcifications. No acute bony abnormalities. Electronically Signed   By: Burman Nieves M.D.   On: 07/10/2023 18:18   DG Chest Port 1 View  Result Date: 07/10/2023 CLINICAL DATA:  Question of sepsis to evaluate for abnormality. EXAM: PORTABLE CHEST 1 VIEW COMPARISON:  05/01/2023 FINDINGS: Heart size and pulmonary vascularity are normal. Lungs are clear. No pleural effusions. No pneumothorax. Mediastinal contours appear intact. Degenerative changes in the spine and shoulders. Postoperative change in the right shoulder. IMPRESSION: No active disease. Electronically Signed   By: Burman Nieves M.D.   On: 07/10/2023 18:16   DG Wrist Complete Right  Result Date: 07/10/2023 CLINICAL DATA:  Question of sepsis. Evaluate for abnormality. Right wrist is swollen and warm to touch. EXAM: RIGHT WRIST - COMPLETE 3+ VIEW COMPARISON:  None Available. FINDINGS: Diffuse bone demineralization. Degenerative changes involving the radiocarpal and STT joints. Calcifications in the right wrist in the scapholunate and radiocarpal row as well as in the region of the triangular fibrocartilage and dorsal joint. These changes may represent ligamentous calcification or degenerative calcification. Loose bodies may be present. No bone destruction or cortical erosion to suggest evidence of osteomyelitis. Diffuse soft tissue swelling. Vascular calcifications. IMPRESSION: Diffuse demineralization and degenerative changes in  the right wrist. Soft tissue calcifications may represent ligamentous calcification or degenerative calcification with possible loose bodies. No evidence of osteomyelitis. Soft tissue swelling. Electronically Signed   By: Burman Nieves M.D.   On: 07/10/2023 18:16    Procedures Procedures    Medications Ordered in ED Medications  vancomycin (VANCOCIN) IVPB 1000 mg/200 mL premix (has no administration in time range)  ceFEPIme (MAXIPIME) 2 g in sodium chloride 0.9 %  100 mL IVPB (has no administration in time range)  vancomycin (VANCOREADY) IVPB 500 mg/100 mL (has no administration in time range)  vancomycin (VANCOREADY) IVPB 1250 mg/250 mL (has no administration in time range)  0.9 %  sodium chloride infusion ( Intravenous New Bag/Given 07/10/23 1819)  ceFEPIme (MAXIPIME) 2 g in sodium chloride 0.9 % 100 mL IVPB (0 g Intravenous Stopped 07/10/23 1902)  metroNIDAZOLE (FLAGYL) IVPB 500 mg (500 mg Intravenous New Bag/Given 07/10/23 1911)    ED Course/ Medical Decision Making/ A&P Clinical Course as of 07/12/23 1612  Wed Jul 10, 2023  1801 RBC: 3.90 [AH]  1802 WBC: 8.3 [AH]  2103 Urinalysis, w/ Reflex to Culture (Infection Suspected) -Urine, Clean Catch(!) [AH]    Clinical Course User Index [AH] Arthor Captain, PA-C                             Medical Decision Making This patient presents to the ED for concern of ams, this involves an extensive number of treatment options, and is a complaint that carries with it a high risk of complications and morbidity.  The differential diagnosis for AMS is extensive and includes, but is not limited to: drug overdose - opioids, alcohol, sedatives, antipsychotics, drug withdrawal, others; Metabolic: hypoxia, hypoglycemia, hyperglycemia, hypercalcemia, hypernatremia, hyponatremia, uremia, hepatic encephalopathy, hypothyroidism, hyperthyroidism, vitamin B12 or thiamine deficiency, carbon monoxide poisoning, Wilson's disease, Lactic acidosis, DKA/HHOS;  Infectious: meningitis, encephalitis, bacteremia/sepsis, urinary tract infection, pneumonia, neurosyphilis; Structural: Space-occupying lesion, (brain tumor, subdural hematoma, hydrocephalus,); Vascular: stroke, subarachnoid hemorrhage, coronary ischemia, hypertensive encephalopathy, CNS vasculitis, thrombotic thrombocytopenic purpura, disseminated intravascular coagulation, hyperviscosity; Psychiatric: Schizophrenia, depression; Other: Seizure, hypothermia, heat stroke, ICU psychosis, dementia -"sundowning."    Co morbidities:      has a past medical history of CAD (coronary artery disease), Diabetes mellitus with neuropathy, Diabetic neuropathy (HCC), Diastolic dysfunction, Hypertension, Stroke (HCC), and Urinary incontinence.   Social Determinants of Health:       SDOH Screenings Food Insecurity: No Food Insecurity (07/11/2023) Housing: Low Risk  (07/11/2023) Transportation Needs: No Transportation Needs (07/11/2023) Utilities: Not At Risk (07/11/2023) Physical Activity: Inactive (02/08/2019) Social Connections: Unknown (02/08/2019) Stress: No Stress Concern Present (02/08/2019) Tobacco Use: Low Risk  (07/10/2023)   Additional history:  {Additional history obtained from family   Lab Tests:  I Ordered, and personally interpreted labs.  The pertinent results include:   UA - cath urine- still appears infected but improved Otherwise    Imaging Studies:  I ordered imaging studies including ct head, R wrist and L hand xray I independently visualized and interpreted imaging which showed no acute findings I agree with the radiologist interpretation  Cardiac Monitoring/ECG:       The patient was maintained on a cardiac monitor.  I personally viewed and interpreted the cardiac monitored which showed an underlying rhythm of: sinus rhythm  Medicines ordered and prescription drug management:  I ordered medication including Medications aspirin EC tablet 81 mg (81 mg Oral Given 07/12/23  0902) metoprolol succinate (TOPROL-XL) 24 hr tablet 50 mg (50 mg Oral Given 07/11/23 2201) simvastatin (ZOCOR) tablet 20 mg (20 mg Oral Given 07/11/23 1736) sertraline (ZOLOFT) tablet 75 mg (75 mg Oral Given 07/12/23 0900) tamsulosin (FLOMAX) capsule 0.4 mg (0.4 mg Oral Given 07/12/23 0902) ticagrelor (BRILINTA) tablet 90 mg (90 mg Oral Given 07/12/23 0902) cholecalciferol (VITAMIN D3) 25 MCG (1000 UNIT) tablet 5,000 Units (5,000 Units Oral Given 07/12/23 0901) enoxaparin (LOVENOX) injection 40 mg (40 mg  Subcutaneous Given 07/11/23 2201)  acetaminophen (TYLENOL) tablet 650 mg (650 mg Oral Given 07/12/23 1048)   Or acetaminophen (TYLENOL) suppository 650 mg ( Rectal See Alternative 07/12/23 1048) traZODone (DESYREL) tablet 25 mg (has no administration in time range) magnesium hydroxide (MILK OF MAGNESIA) suspension 30 mL (has no administration in time range) ondansetron (ZOFRAN) tablet 4 mg (has no administration in time range)   Or ondansetron (ZOFRAN) injection 4 mg (has no administration in time range) gabapentin (NEURONTIN) capsule 300 mg (300 mg Oral Given 07/11/23 2200) gabapentin (NEURONTIN) capsule 600 mg (600 mg Oral Given 07/12/23 0901) cefTRIAXone (ROCEPHIN) 1 g in sodium chloride 0.9 % 100 mL IVPB ( Intravenous Infusion Verify 07/11/23 1508) insulin aspart (novoLOG) injection 0-9 Units (5 Units Subcutaneous Given 07/12/23 1201) insulin aspart (novoLOG) injection 0-5 Units ( Subcutaneous Not Given 07/11/23 2148) ceFEPIme (MAXIPIME) 2 g in sodium chloride 0.9 % 100 mL IVPB (0 g Intravenous Stopped 07/10/23 1902) metroNIDAZOLE (FLAGYL) IVPB 500 mg (0 mg Intravenous Stopped 07/10/23 2026) vancomycin (VANCOCIN) IVPB 1000 mg/200 mL premix (0 mg Intravenous Stopped 07/11/23 0807) vancomycin (VANCOREADY) IVPB 500 mg/100 mL (0 mg Intravenous Stopped 07/11/23 0807) for ams, hypothermia, presumed infection Reevaluation of the patient after these medicines showed that the patient improved I have reviewed the  patients home medicines and have made adjustments as needed  Test Considered:         Critical Interventions:       warmed fluids and blanket  Consultations Obtained: TRH for admission  Problem List / ED Course:       (R41.82) Altered mental status, unspecified altered mental status type  (primary encounter diagnosis)   MDM: patient with altered mental status/ delirium in the setting of under treatment of uti. Patient does NOT appear to have sepsis. Needs admission for delirium. Urine culture pending.   Dispostion:  After consideration of the diagnostic results and the patients response to treatment, I feel that the patent would benefit from admission    Amount and/or Complexity of Data Reviewed Labs: ordered. Decision-making details documented in ED Course. Radiology: ordered. ECG/medicine tests: ordered.  Risk Prescription drug management. Decision regarding hospitalization.           Final Clinical Impression(s) / ED Diagnoses Final diagnoses:  None    Rx / DC Orders ED Discharge Orders     None         Arthor Captain, PA-C 07/12/23 1626    Vanetta Mulders, MD 07/15/23 [phone]

## 2023-07-10 NOTE — ED Triage Notes (Signed)
Pt brought in by [facility] from home with c/o AMS. Pt was recently diagnosed with UTI but hasn't been taking her antibiotic due to some confusion of her children not reporting to the other one to administer it. Pt has only taken 1 of 2 doses each day for the last 2 days. Pt c/o pain to right wrist which is swollen and warm to the touch.

## 2023-07-10 NOTE — ED Notes (Signed)
 Warm blankets applied to pt.  

## 2023-07-11 DIAGNOSIS — Z7984 Long term (current) use of oral hypoglycemic drugs: Secondary | ICD-10-CM | POA: Diagnosis not present

## 2023-07-11 DIAGNOSIS — Z66 Do not resuscitate: Secondary | ICD-10-CM | POA: Diagnosis present

## 2023-07-11 DIAGNOSIS — E1142 Type 2 diabetes mellitus with diabetic polyneuropathy: Secondary | ICD-10-CM | POA: Diagnosis present

## 2023-07-11 DIAGNOSIS — E785 Hyperlipidemia, unspecified: Secondary | ICD-10-CM | POA: Diagnosis present

## 2023-07-11 DIAGNOSIS — Z79899 Other long term (current) drug therapy: Secondary | ICD-10-CM | POA: Diagnosis not present

## 2023-07-11 DIAGNOSIS — F32A Depression, unspecified: Secondary | ICD-10-CM | POA: Diagnosis present

## 2023-07-11 DIAGNOSIS — Z888 Allergy status to other drugs, medicaments and biological substances status: Secondary | ICD-10-CM | POA: Diagnosis not present

## 2023-07-11 DIAGNOSIS — R4182 Altered mental status, unspecified: Secondary | ICD-10-CM | POA: Diagnosis present

## 2023-07-11 DIAGNOSIS — Z885 Allergy status to narcotic agent status: Secondary | ICD-10-CM | POA: Diagnosis not present

## 2023-07-11 DIAGNOSIS — Z8673 Personal history of transient ischemic attack (TIA), and cerebral infarction without residual deficits: Secondary | ICD-10-CM | POA: Diagnosis not present

## 2023-07-11 DIAGNOSIS — Z1152 Encounter for screening for COVID-19: Secondary | ICD-10-CM | POA: Diagnosis not present

## 2023-07-11 DIAGNOSIS — G9341 Metabolic encephalopathy: Secondary | ICD-10-CM

## 2023-07-11 DIAGNOSIS — N390 Urinary tract infection, site not specified: Secondary | ICD-10-CM | POA: Diagnosis present

## 2023-07-11 DIAGNOSIS — Z7902 Long term (current) use of antithrombotics/antiplatelets: Secondary | ICD-10-CM | POA: Diagnosis not present

## 2023-07-11 DIAGNOSIS — Z7982 Long term (current) use of aspirin: Secondary | ICD-10-CM | POA: Diagnosis not present

## 2023-07-11 DIAGNOSIS — I25810 Atherosclerosis of coronary artery bypass graft(s) without angina pectoris: Secondary | ICD-10-CM | POA: Diagnosis present

## 2023-07-11 DIAGNOSIS — I10 Essential (primary) hypertension: Secondary | ICD-10-CM | POA: Diagnosis present

## 2023-07-11 DIAGNOSIS — Z9842 Cataract extraction status, left eye: Secondary | ICD-10-CM | POA: Diagnosis not present

## 2023-07-11 DIAGNOSIS — I252 Old myocardial infarction: Secondary | ICD-10-CM | POA: Diagnosis not present

## 2023-07-11 DIAGNOSIS — Z8249 Family history of ischemic heart disease and other diseases of the circulatory system: Secondary | ICD-10-CM | POA: Diagnosis not present

## 2023-07-11 DIAGNOSIS — Z9841 Cataract extraction status, right eye: Secondary | ICD-10-CM | POA: Diagnosis not present

## 2023-07-11 DIAGNOSIS — I2510 Atherosclerotic heart disease of native coronary artery without angina pectoris: Secondary | ICD-10-CM | POA: Diagnosis present

## 2023-07-11 DIAGNOSIS — Z882 Allergy status to sulfonamides status: Secondary | ICD-10-CM | POA: Diagnosis not present

## 2023-07-11 DIAGNOSIS — I4510 Unspecified right bundle-branch block: Secondary | ICD-10-CM | POA: Diagnosis present

## 2023-07-11 DIAGNOSIS — Z823 Family history of stroke: Secondary | ICD-10-CM | POA: Diagnosis not present

## 2023-07-11 LAB — BASIC METABOLIC PANEL
Anion gap: 6 (ref 5–15)
BUN: 22 mg/dL (ref 8–23)
CO2: 23 mmol/L (ref 22–32)
Calcium: 8.9 mg/dL (ref 8.9–10.3)
Chloride: 106 mmol/L (ref 98–111)
Creatinine, Ser: 0.69 mg/dL (ref 0.44–1.00)
GFR, Estimated: >60 mL/min (ref >60)
Glucose, Bld: 211 mg/dL — ABNORMAL HIGH (ref 70–99)
Potassium: 3.8 mmol/L (ref 3.5–5.1)
Sodium: 135 mmol/L (ref 135–145)

## 2023-07-11 LAB — GLUCOSE, CAPILLARY
Glucose-Capillary: 170 mg/dL — ABNORMAL HIGH (ref 70–99)
Glucose-Capillary: 194 mg/dL — ABNORMAL HIGH (ref 70–99)
Glucose-Capillary: 229 mg/dL — ABNORMAL HIGH (ref 70–99)
Glucose-Capillary: 186 mg/dL — ABNORMAL HIGH (ref 70–99)
Glucose-Capillary: 172 mg/dL — ABNORMAL HIGH (ref 70–99)

## 2023-07-11 LAB — CBC
HCT: 35.4 % — ABNORMAL LOW (ref 36.0–46.0)
Hemoglobin: 11.1 g/dL — ABNORMAL LOW (ref 12.0–15.0)
MCH: 28.2 pg (ref 26.0–34.0)
MCHC: 31.4 g/dL (ref 30.0–36.0)
MCV: 89.8 fL (ref 80.0–100.0)
Platelets: 190 10*3/uL (ref 150–400)
RBC: 3.94 MIL/uL (ref 3.87–5.11)
RDW: 15 % (ref 11.5–15.5)
WBC: 7.5 10*3/uL (ref 4.0–10.5)
nRBC: 0 % (ref 0.0–0.2)

## 2023-07-11 MED ORDER — INSULIN ASPART 100 UNIT/ML IJ SOLN: 0.0000 [IU] | Freq: Every day | INTRAMUSCULAR | Status: DC

## 2023-07-11 MED ORDER — VANCOMYCIN HCL IN DEXTROSE 1-5 GM/200ML-% IV SOLN: 1000.0000 mg | INTRAVENOUS | Status: DC

## 2023-07-11 MED ORDER — INSULIN ASPART 100 UNIT/ML IJ SOLN
0.0000 [IU] | Freq: Three times a day (TID) | INTRAMUSCULAR | Status: DC
  Administered 2023-07-11: 2 [IU] via SUBCUTANEOUS
  Administered 2023-07-11: 3 [IU] via SUBCUTANEOUS
  Administered 2023-07-11: 2 [IU] via SUBCUTANEOUS
  Administered 2023-07-12: 5 [IU] via SUBCUTANEOUS
  Administered 2023-07-12: 1 [IU] via SUBCUTANEOUS
  Administered 2023-07-12: 5 [IU] via SUBCUTANEOUS

## 2023-07-11 NOTE — Plan of Care (Signed)

## 2023-07-11 NOTE — Evaluation (Signed)
Physical Therapy Evaluation Patient Details Name: [name] MRN: [id number] DOB: [birth year] Today's Date: 07/11/2023  History of Present Illness  [name] is a 89 y.o. female with medical history significant for coronary artery disease, type 2 diabetes mellitus with peripheral neuropathy, diastolic dysfunction, hypertension and CVA, who presented to the emergency room with acute onset of worsening altered mental status.  The patient was recently admitted here for UTI and acute metabolic encephalopathy, with improvement over the last couple of days until today when this happened.  The patient was fairly confused today and thought this year is 17.  Her daughter reported that she missed 2 doses of her oral antibiotic Omnicef over the last couple of days.  Her Home health nurse noted she was fairly lethargic and confused and was complaining of severe pain and swelling in the right wrist.  No falls or injuries were reported.  No nausea or vomiting or abdominal pain.  No fever or chills.  No worsening urinary symptoms.  No cough or wheezing or hemoptysis.  No chest pain or palpitations.   Clinical Impression  Patient demonstrates slow labored movement for sitting up at bedside with limited use of RUE due to wrist/hand pain, poor tolerance for attempting sliding board transfers due to frequent loss of sitting balance and unable to balance on right hand, required stand pivot with Max assist to transfer to chair.  Patient tolerated sitting up in chair with her daughter present after therapy. Patient will benefit from continued skilled physical therapy in hospital and recommended venue below to increase strength, balance, endurance for safe ADLs and gait.           Assistance Recommended at Discharge    If plan is discharge home, recommend the following:  Can travel by private vehicle  A lot of help with walking and/or transfers;A lot of help with bathing/dressing/bathroom;Assistance  with cooking/housework;Help with stairs or ramp for entrance   Yes    Equipment Recommendations None recommended by PT  Recommendations for Other Services       Functional Status Assessment Patient has had a recent decline in their functional status and demonstrates the ability to make significant improvements in function in a reasonable and predictable amount of time.     Precautions / Restrictions Precautions Precautions: Fall Restrictions Weight Bearing Restrictions: No      Mobility  Bed Mobility Overal bed mobility: Needs Assistance Bed Mobility: Supine to Sit     Supine to sit: Mod assist, Max assist     General bed mobility comments: increased time, labored movement with limted use of RUE due to wrist/hand pain    Transfers Overall transfer level: Needs assistance Equipment used: 1 person hand held assist Transfers: Bed to chair/wheelchair/BSC, Sit to/from Stand Sit to Stand: Max assist, Mod assist Stand pivot transfers: Max assist         General transfer comment: Patient had diffiuclty scooting laterally and unable to use sliding board, required Max assist stand pivot to transfer to chair    Ambulation/Gait                  Stairs            Wheelchair Mobility     Tilt Bed    Modified Rankin (Stroke Patients Only)       Balance Overall balance assessment: Needs assistance Sitting-balance support: Feet supported, No upper extremity supported Sitting balance-Leahy Scale: Fair Sitting balance - Comments: seated at EOB   Standing  balance support: During functional activity, No upper extremity supported Standing balance-Leahy Scale: Poor Standing balance comment: Poor with hand held assist                             Pertinent Vitals/Pain Pain Assessment Pain Assessment: Faces Faces Pain Scale: Hurts little more Pain Location: right wrist/hand Pain Descriptors / Indicators: Sore, Grimacing, Guarding,  Discomfort Pain Intervention(s): Limited activity within patient's tolerance, Monitored during session, Repositioned    Home Living Family/patient expects to be discharged to:: Private residence Living Arrangements: Children Available Help at Discharge: Family;Available 24 hours/day Type of Home: House Home Access: Ramped entrance     Alternate Level Stairs-Number of Steps: patient does not go upstairs Home Layout: Two level Home Equipment: BSC/3in1;Shower seat;Grab bars - toilet;Grab bars - tub/shower;Wheelchair - manual      Prior Function Prior Level of Function : Needs assist       Physical Assist : Mobility (physical);ADLs (physical) Mobility (physical): Bed mobility;Transfers   Mobility Comments: non-ambulatory, stand pivot transfers Mod Independent ADLs Comments: assisted by family, home aides from 9-5 AM and 6-8 PM x 5 days/week     Hand Dominance   Dominant Hand: Right    Extremity/Trunk Assessment   Upper Extremity Assessment Upper Extremity Assessment: Defer to OT evaluation    Lower Extremity Assessment Lower Extremity Assessment: Generalized weakness    Cervical / Trunk Assessment Cervical / Trunk Assessment: Normal  Communication   Communication: HOH  Cognition Arousal/Alertness: Awake/alert Behavior During Therapy: WFL for tasks assessed/performed Overall Cognitive Status: History of cognitive impairments - at baseline                                          General Comments      Exercises     Assessment/Plan    PT Assessment Patient needs continued PT services  PT Problem List Decreased strength;Decreased activity tolerance;Decreased balance;Decreased mobility;Pain       PT Treatment Interventions DME instruction;Functional mobility training;Therapeutic activities;Therapeutic exercise;Patient/family education;Wheelchair mobility training;Balance training    PT Goals (Current goals can be found in the Care Plan  section)  Acute Rehab PT Goals Patient Stated Goal: Return home with assist from family PT Goal Formulation: With patient/family Time For Goal Achievement: 07/25/23 Potential to Achieve Goals: Fair    Frequency Min 3X/week     Co-evaluation               AM-PAC PT "6 Clicks" Mobility  Outcome Measure Help needed turning from your back to your side while in a flat bed without using bedrails?: A Lot Help needed moving from lying on your back to sitting on the side of a flat bed without using bedrails?: A Lot Help needed moving to and from a bed to a chair (including a wheelchair)?: A Lot Help needed standing up from a chair using your arms (e.g., wheelchair or bedside chair)?: A Lot Help needed to walk in hospital room?: Total Help needed climbing 3-5 steps with a railing? : Total 6 Click Score: 10    End of Session   Activity Tolerance: Patient tolerated treatment well;Patient limited by fatigue Patient left: in chair;with call bell/phone within reach Nurse Communication: Mobility status PT Visit Diagnosis: Unsteadiness on feet (R26.81);Other abnormalities of gait and mobility (R26.89);Muscle weakness (generalized) (M62.81)    Time: 1610-9604 PT Time  Calculation (min) (ACUTE ONLY): 30 min   Charges:   PT Evaluation $PT Eval Moderate Complexity: 1 Mod PT Treatments $Therapeutic Activity: 23-37 mins PT General Charges $$ ACUTE PT VISIT: 1 Visit         1:58 PM, 07/11/23 Ocie Bob, MPT Physical Therapist with [facility] [phone] office [phone] mobile phone

## 2023-07-11 NOTE — TOC Progression Note (Addendum)
Transition of Care [facility]) - Progression Note    Patient Details  Name: [name] MRN: [id number] Date of Birth: [birth year]  [facility] of [facility]) CM/SW Contact  Catalina Gravel, [place] Phone Number: 07/11/2023, 3:10 PM  Clinical Narrative:    Pt is high risk for readmission. Pt and her 89 year old spouse live alone.  The family has 5.5 hours of home care daily  to assist with ADLs. Family members, support appointment transportation. Pt has a wc, there is a ramp on the home, pt has a hospital bed, and bsc.     CSW contacted daughter advised PT recommendations and shared that will speak with pt. Daughter in agreement with pursuing.  CSW met with pt at bedside regarding PT recommendation for SNF. Talked about short term rehab, Pt agreeable to pursue short term rehab.  PASSR #[id number].  TOC to start Auth and send for bed requests.      Barriers to Discharge: Continued Medical Work up  Expected Discharge Plan and Services                                               Social Determinants of Health (SDOH) Interventions SDOH Screenings   Food Insecurity: No Food Insecurity (07/11/2023)  Housing: Low Risk  (07/11/2023)  Transportation Needs: No Transportation Needs (07/11/2023)  Utilities: Not At Risk (07/11/2023)  Physical Activity: Inactive (02/08/2019)  Social Connections: Unknown (02/08/2019)  Stress: No Stress Concern Present (02/08/2019)  Tobacco Use: Low Risk  (07/10/2023)    Readmission Risk Interventions    07/06/2023    3:49 PM 02/12/2023   11:26 AM 01/04/2023   11:03 AM  Readmission Risk Prevention Plan  Transportation Screening Complete Complete Complete  PCP or Specialist Appt within 3-5 Days Complete    HRI or Home Care Consult Complete Complete   Social Work Consult for Recovery Care Planning/Counseling Complete Complete Complete  Palliative Care Screening Not Applicable Not Applicable   Medication Review Oceanographer) Complete Complete

## 2023-07-11 NOTE — [place] FL2 (Signed)
[place] MEDICAID FL2 LEVEL OF CARE FORM     IDENTIFICATION  Patient Name: [name] Birthdate: 1929/06/29 Sex: female Admission Date (Current Location): 07/10/2023  [facility] and [place] Number:  [employer] and Address:  [facility],  [address] [place] [address]      Provider Number: [id number]  Attending Physician Name and Address:  Kendell Bane, MD  Relative Name and Phone Number:  Peggy, Log[facility])  [phone] (Home Phone)    Current Level of Care: Hospital Recommended Level of Care: Skilled Nursing Facility Prior Approval Number:    Date Approved/Denied:   PASRR Number: [id number]  Discharge Plan: SNF    Current Diagnoses: Patient Active Problem List   Diagnosis Date Noted   Altered mental status 07/11/2023   Acute lower UTI 07/10/2023   Dyslipidemia 07/10/2023   Type 2 diabetes mellitus with peripheral neuropathy (HCC) 07/10/2023   Hypoalbuminemia due to protein-calorie malnutrition (HCC) 07/05/2023   Impaction of colon (HCC) 06/28/2023   DNR (do not resuscitate) 06/28/2023   Dehydration 05/05/2023   Hypoglycemia 02/10/2023   Chest pain 02/10/2023   Depression 02/10/2023   CAD (coronary artery disease) of artery bypass graft 01/04/2023   Pressure injury of skin 01/04/2023   Physical debility 01/04/2023   Leukocytosis 01/04/2023   Acute ST elevation myocardial infarction (STEMI) of inferior wall (HCC) 12/31/2022   SARS-CoV-2 positive 08/24/2022   SARS-CoV-2 antibody positive 08/23/2022   Emphysema of lung (HCC) 08/21/2022   Vascular dementia without behavioral disturbance (HCC) 08/21/2022   Decubitus ulcer of buttock, stage 2 (HCC) 08/13/2022   Hypomagnesemia 08/03/2022   Hypoglycemia associated with diabetes (HCC) 08/02/2022   Hypokalemia 08/02/2022   Uncontrolled type 2 diabetes mellitus with hyperglycemia, without long-term current use of insulin (HCC) 07/19/2022   Diabetic neuropathy associated  with type 2 diabetes mellitus (HCC) 07/16/2022   Hypertension associated with type 2 diabetes mellitus (HCC) 07/16/2022   Hyperlipidemia associated with type 2 diabetes mellitus (HCC) 07/16/2022   Chronic insomnia 07/16/2022   Bilateral lower extremity edema 07/16/2022   Chronic constipation 07/16/2022   Aortic atherosclerosis (HCC) 07/16/2022   Major neurocognitive disorder (HCC) 07/16/2022   DJD (degenerative joint disease) of knee 07/14/2022   UTI (urinary tract infection) 07/09/2022   Late effects of cerebrovascular disease 09/25/2021   Acute metabolic encephalopathy 07/14/2019   Cellulitis of buttock 07/14/2019   Hyperlipidemia 02/20/2016   Essential hypertension 10/10/2015   Controlled type 2 diabetes with neuropathy (HCC) 10/01/2008    Orientation RESPIRATION BLADDER Height & Weight     Situation, Self  Normal External catheter (New) Weight: 147 lb 14.9 oz (67.1 kg) Height:  5\' 7"  (170.2 cm)  BEHAVIORAL SYMPTOMS/MOOD NEUROLOGICAL BOWEL NUTRITION STATUS      Continent Diet  AMBULATORY STATUS COMMUNICATION OF NEEDS Skin   Extensive Assist (Tranfer Assis Mod/Max) Verbally Normal (Dry)                       Personal Care Assistance Level of Assistance  Bathing, Feeding, Dressing Bathing Assistance: Limited assistance Feeding assistance: Limited assistance Dressing Assistance: Limited assistance     Functional Limitations Info  Hearing   Hearing Info: Impaired      SPECIAL CARE FACTORS FREQUENCY  PT (By licensed PT), OT (By licensed OT)     PT Frequency: 3X week min OT Frequency: 3 times/week min            Contractures Contractures Info: Not present  Additional Factors Info  Code Status, Allergies Code Status Info: DNR Allergies Info: Codeine, Glipizide, Sulfa Antibiotics           Current Medications (07/11/2023):  This is the current hospital active medication list Current Facility-Administered Medications  Medication Dose Route Frequency  Provider Last Rate Last Admin   0.9 %  sodium chloride infusion   Intravenous Continuous Mansy, Jan A, MD 100 mL/hr at 07/11/23 1508 Infusion Verify at 07/11/23 1508   acetaminophen (TYLENOL) tablet 650 mg  650 mg Oral Q6H PRN Mansy, Jan A, MD       Or   acetaminophen (TYLENOL) suppository 650 mg  650 mg Rectal Q6H PRN Mansy, Vernetta Honey, MD       aspirin EC tablet 81 mg  81 mg Oral Daily Mansy, Jan A, MD   81 mg at 07/11/23 0806   cefTRIAXone (ROCEPHIN) 1 g in sodium chloride 0.9 % 100 mL IVPB  1 g Intravenous Q24H Mansy, Jan A, MD 200 mL/hr at 07/11/23 1508 Infusion Verify at 07/11/23 1508   cholecalciferol (VITAMIN D3) 25 MCG (1000 UNIT) tablet 5,000 Units  5,000 Units Oral Daily Mansy, Jan A, MD   5,000 Units at 07/11/23 0805   enoxaparin (LOVENOX) injection 40 mg  40 mg Subcutaneous Q24H Mansy, Jan A, MD   40 mg at 07/10/23 2319   gabapentin (NEURONTIN) capsule 300 mg  300 mg Oral QHS Mansy, Jan A, MD   300 mg at 07/10/23 2320   gabapentin (NEURONTIN) capsule 600 mg  600 mg Oral Daily Mansy, Jan A, MD   600 mg at 07/11/23 0804   insulin aspart (novoLOG) injection 0-5 Units  0-5 Units Subcutaneous QHS Mansy, Jan A, MD       insulin aspart (novoLOG) injection 0-9 Units  0-9 Units Subcutaneous TID WC Mansy, Jan A, MD   3 Units at 07/11/23 1237   magnesium hydroxide (MILK OF MAGNESIA) suspension 30 mL  30 mL Oral Daily PRN Mansy, Jan A, MD       metoprolol succinate (TOPROL-XL) 24 hr tablet 50 mg  50 mg Oral QHS Mansy, Jan A, MD       ondansetron [facility]) tablet 4 mg  4 mg Oral Q6H PRN Mansy, Jan A, MD       Or   ondansetron [facility]) injection 4 mg  4 mg Intravenous Q6H PRN Mansy, Jan A, MD       sertraline (ZOLOFT) tablet 75 mg  75 mg Oral Daily Mansy, Jan A, MD   75 mg at 07/11/23 0803   simvastatin (ZOCOR) tablet 20 mg  20 mg Oral QPM Mansy, Jan A, MD   20 mg at 07/10/23 2320   tamsulosin (FLOMAX) capsule 0.4 mg  0.4 mg Oral Daily Mansy, Jan A, MD   0.4 mg at 07/11/23 0804   ticagrelor (BRILINTA)  tablet 90 mg  90 mg Oral BID Mansy, Jan A, MD   90 mg at 07/11/23 0805   traZODone (DESYREL) tablet 25 mg  25 mg Oral QHS PRN Mansy, Vernetta Honey, MD         Discharge Medications: Please see discharge summary for a list of discharge medications.  Relevant Imaging Results:  Relevant Lab Results:   Additional Information [id number]  Catalina Gravel, LCSW

## 2023-07-11 NOTE — Progress Notes (Signed)
Pharmacy Antibiotic Note  [name] is a 89 y.o. female admitted on 07/10/2023 with sepsis.  Pharmacy has been consulted for Vanc and cefepime dosing.  Height: 5\' 7"  (170.2 cm) Weight: 67.1 kg (147 lb 14.9 oz) IBW/kg (Calculated) : 61.6  Temp (24hrs), Avg:97.6 F (36.4 C), Min:95.7 F (35.4 C), Max:98.3 F (36.8 C)  Recent Labs  Lab 07/04/23 2138 07/05/23 0522 07/06/23 0458 07/08/23 0526 07/10/23 1727 07/10/23 1831 07/10/23 1901 07/11/23 0402  WBC  --  7.7 9.0 7.9 8.3  --   --  7.5  CREATININE  --  0.80 0.76 0.67 0.81 0.80  --  0.69  LATICACIDVEN 1.7  --   --   --  1.5  --  1.5  --     Estimated Creatinine Clearance: 44.5 mL/min (by C-G formula based on SCr of 0.69 mg/dL).    Allergies  Allergen Reactions   Codeine Nausea And Vomiting   Glipizide     Hypoglycemia with glucoses recorded in the 20s necessitating glucagon and D10 IV   Sulfa Antibiotics Other (See Comments)    Unspecified     Antimicrobials this admission: Cefepime 7/17 Ceftriaxone 7/18 >> Vancomycin 7/18 >>  Dose adjustments this admission: Vancomycin 1250 mg >> 1000 mg  Microbiology results: 7/17 UCx: sent 7/17 Bcx: NG<12h  PLAN: Reduce vancomycin dosing to 1000 mg IV q24H Follow up culture results to assess for antibiotic optimization. Monitor renal function to assess for any necessary antibiotic dosing changes.  Will M. Dareen Piano, PharmD PGY-1 Pharmacy Resident 07/11/2023 9:08 AM

## 2023-07-11 NOTE — Progress Notes (Signed)
PROGRESS NOTE    Patient: [name]                            PCP: Assunta Found, MD                    DOB: 01/13/1929            DOA: 07/10/2023 [id number]             DOS: 07/11/2023, 11:34 AM   LOS: 0 days   Date of Service: The patient was seen and examined on 07/11/2023  Subjective:   The patient was seen and examined this morning. Hemodynamically stable. No issues overnight .  Brief Narrative:   [name] is a 89 y.o. female with medical history significant for CAD, Dm II - peripheral neuropathy, diastolic dysfunction,HTN and CVA, who presented to the emergency room with acute onset of worsening altered mental status.   The patient was recently admitted here for UTI and acute metabolic encephalopathy, with some improvement over the last couple of days  The patient was fairly confused and thought this year is 42.  Her daughter reported that she missed 2 doses of her oral antibiotic Omnicef over the last couple of days.  Per her Home health nurse noted she was fairly lethargic and confused and was complaining of severe pain and swelling in the right wrist.  No fever or chills.  No worsening urinary symptoms.    ED Course:  Temp. was 95.7 with otherwise normal vital signs.  Labs revealed blood glucose of 210 and BUN of 28.  CBC showed hemoglobin of 11.1 and hematocrit 34.4.  Repeat H&H were 11.6 and 34.  Lactic acid was 1.5 and later the same.  High sensitive troponin I was 12 and later 11.  EKG as reviewed by me : EKG showed normal sinus rhythm with a rate of 77 with right bundle branch block and Q waves anterolaterally and inferiorly.  Imaging: Right wrist x-ray showed degenerative changes in the right wrist with diffuse demineralization, soft tissue calcification  Left hand x-ray showed degenerative changes in the left hand and wrist with likely ligamentous calcifications with no acute bony abnormalities.   The patient was given IV cefepime, vancomycin  and Flagyl was started on IV normal saline at 150 mL/h.       IMPRESSION AND PLAN:   Assessment and Plan:  Acute metabolic encephalopathy - Likely due to infection, persistent UTI -Continue monitoring closely, treating underlying cause of -Continue neurochecks -Patient has improved .   Acute lower UTI - Will continue IV Rocephin and follow urine culture. - Her last urine culture was negative on 07/05/2023. - She had grown Enterobacter, low ACE on 05/01/2023 and Proteus Mirabilis on 02/11/2023.   Type 2 diabetes mellitus with peripheral neuropathy (HCC) - The patient will be placed on supplemental coverage with NovoLog. - We will hold off metformin. - We will continue Neurontin.   CAD (coronary artery disease) of artery bypass graft - We will continue statin therapy, Toprol-XL, aspirin and Brilinta.   Dyslipidemia - We will continue statin therapy.   Depression - We will continue Zoloft.    ------------------------------------------------------------------------------------------------------------------------------- Nutritional status:  The patient's BMI is: Body mass index is 23.17 kg/m. I agree with the assessment and plan as outlined   Skin Assessment: I have examined the patient's skin and I agree with the wound assessment as performed by wound care  team As outlined belowe: Pressure Injury 02/11/23 Buttocks Left Stage 2 -  Partial thickness loss of dermis presenting as a shallow open injury with a red, pink wound bed without slough. 0.5 cm x 3 cm x 0 cm, shallow pink base, looks like prior site of skin tear (Active)  02/11/23 1200  Location: Buttocks  Location Orientation: Left  Staging: Stage 2 -  Partial thickness loss of dermis presenting as a shallow open injury with a red, pink wound bed without slough.  Wound Description (Comments): 0.5 cm x 3 cm x 0 cm, shallow pink base, looks like prior site of skin tear  Present on Admission: Yes     Pressure Injury  02/11/23 Buttocks Right Stage 2 -  Partial thickness loss of dermis presenting as a shallow open injury with a red, pink wound bed without slough. 0.5cm x 1 cm - shallow pink base, no drainage (Active)  02/11/23 1200  Location: Buttocks  Location Orientation: Right  Staging: Stage 2 -  Partial thickness loss of dermis presenting as a shallow open injury with a red, pink wound bed without slough.  Wound Description (Comments): 0.5cm x 1 cm - shallow pink base, no drainage  Present on Admission: Yes   ---------------------------------------------------------------------------------------------------------------------------- Cultures; Urine Culture  >>> NGT     ------------------------------------------------------------------------------------------------------------------------------------------------  DVT prophylaxis:  enoxaparin (LOVENOX) injection 40 mg Start: 07/10/23 2200   Code Status:   Code Status: DNR  Family Communication: No family member present at bedside- attempt will be made to update daily The above findings and plan of care has been discussed with patient (and family)  in detail,  they expressed understanding and agreement of above. -Advance care planning has been discussed.   Admission status:   Status is: Observation The patient remains OBS appropriate and will d/c before 2 midnights.   Disposition: From  - home             Planning for discharge in 1-2 days: to   Procedures:   No admission procedures for hospital encounter.   Antimicrobials:  Anti-infectives (From admission, onward)    Start     Dose/Rate Route Frequency Ordered Stop   07/11/23 2200  vancomycin (VANCOCIN) IVPB 1000 mg/200 mL premix        1,000 mg 200 mL/hr over 60 Minutes Intravenous Every 24 hours 07/11/23 0907     07/11/23 1700  vancomycin (VANCOREADY) IVPB 1250 mg/250 mL  Status:  Discontinued        1,250 mg 166.7 mL/hr over 90 Minutes Intravenous Every 24 hours 07/10/23 1739  07/11/23 0907   07/11/23 0600  ceFEPIme (MAXIPIME) 2 g in sodium chloride 0.9 % 100 mL IVPB  Status:  Discontinued        2 g 200 mL/hr over 30 Minutes Intravenous Every 12 hours 07/10/23 1739 07/10/23 2243   07/11/23 0600  cefTRIAXone (ROCEPHIN) 1 g in sodium chloride 0.9 % 100 mL IVPB        1 g 200 mL/hr over 30 Minutes Intravenous Every 24 hours 07/10/23 2225     07/10/23 1745  ceFEPIme (MAXIPIME) 2 g in sodium chloride 0.9 % 100 mL IVPB        2 g 200 mL/hr over 30 Minutes Intravenous  Once 07/10/23 1733 07/10/23 1902   07/10/23 1745  metroNIDAZOLE (FLAGYL) IVPB 500 mg        500 mg 100 mL/hr over 60 Minutes Intravenous  Once 07/10/23 1733 07/10/23 2026   07/10/23 1745  vancomycin (  VANCOCIN) IVPB 1000 mg/200 mL premix        1,000 mg 200 mL/hr over 60 Minutes Intravenous  Once 07/10/23 1733 07/11/23 0807   07/10/23 1745  vancomycin (VANCOREADY) IVPB 500 mg/100 mL        500 mg 100 mL/hr over 60 Minutes Intravenous  Once 07/10/23 1739 07/11/23 0807        Medication:  . aspirin EC  81 mg Oral Daily  . cholecalciferol  5,000 Units Oral Daily  . enoxaparin (LOVENOX) injection  40 mg Subcutaneous Q24H  . gabapentin  300 mg Oral QHS  . gabapentin  600 mg Oral Daily  . insulin aspart  0-5 Units Subcutaneous QHS  . insulin aspart  0-9 Units Subcutaneous TID WC  . metoprolol succinate  50 mg Oral QHS  . sertraline  75 mg Oral Daily  . simvastatin  20 mg Oral QPM  . tamsulosin  0.4 mg Oral Daily  . ticagrelor  90 mg Oral BID    acetaminophen **OR** acetaminophen, magnesium hydroxide, ondansetron **OR** ondansetron (ZOFRAN) IV, traZODone   Objective:   Vitals:   07/10/23 2115 07/10/23 2300 07/11/23 0405 07/11/23 0733  BP: 124/60 (!) 120/58 (!) 134/52 117/71  Pulse: 64 63 65 73  Resp: 12     Temp:  98.2 F (36.8 C) 97.8 F (36.6 C) 98.2 F (36.8 C)  TempSrc:  Oral Oral Oral  SpO2: 98% 98% 100% 97%  Weight:  67.1 kg    Height:        Intake/Output Summary (Last 24  hours) at 07/11/2023 1134 Last data filed at 07/11/2023 1100 Gross per 24 hour  Intake 397.15 ml  Output --  Net 397.15 ml   Filed Weights   07/10/23 1704 07/10/23 2300  Weight: 73 kg 67.1 kg     Physical examination:     General:  AAO x 2,  cooperative, no distress;   HEENT:  Normocephalic, PERRL, otherwise with in Normal limits   Neuro:  CNII-XII intact. , normal motor and sensation, reflexes intact   Lungs:   Clear to auscultation BL, Respirations unlabored,  No wheezes / crackles  Cardio:    S1/S2, RRR, No murmure, No Rubs or Gallops   Abdomen:  Soft, non-tender, bowel sounds active all four quadrants, no guarding or peritoneal signs.  Muscular  skeletal:  Limited exam -global generalized weaknesses - in bed, able to move all 4 extremities,   2+ pulses,  symmetric, No pitting edema  Skin:  Dry, warm to touch, negative for any Rashes,  Wounds: Please see nursing documentation  Pressure Injury 02/11/23 Buttocks Left Stage 2 -  Partial thickness loss of dermis presenting as a shallow open injury with a red, pink wound bed without slough. 0.5 cm x 3 cm x 0 cm, shallow pink base, looks like prior site of skin tear (Active)  02/11/23 1200  Location: Buttocks  Location Orientation: Left  Staging: Stage 2 -  Partial thickness loss of dermis presenting as a shallow open injury with a red, pink wound bed without slough.  Wound Description (Comments): 0.5 cm x 3 cm x 0 cm, shallow pink base, looks like prior site of skin tear  Present on Admission: Yes     Pressure Injury 02/11/23 Buttocks Right Stage 2 -  Partial thickness loss of dermis presenting as a shallow open injury with a red, pink wound bed without slough. 0.5cm x 1 cm - shallow pink base, no drainage (Active)  02/11/23 1200  Location: Buttocks  Location Orientation: Right  Staging: Stage 2 -  Partial thickness loss of dermis presenting as a shallow open injury with a red, pink wound bed without slough.  Wound Description  (Comments): 0.5cm x 1 cm - shallow pink base, no drainage  Present on Admission: Yes         ------------------------------------------------------------------------------------------------------------------------------------------    LABs:     Latest Ref Rng & Units 07/11/2023    4:02 AM 07/10/2023    6:31 PM 07/10/2023    5:27 PM  CBC  WBC 4.0 - 10.5 K/uL 7.5   8.3   Hemoglobin 12.0 - 15.0 g/dL 16.1  09.6  04.5   Hematocrit 36.0 - 46.0 % 35.4  34.0  34.4   Platelets 150 - 400 K/uL 190   201       Latest Ref Rng & Units 07/11/2023    4:02 AM 07/10/2023    6:31 PM 07/10/2023    5:27 PM  CMP  Glucose 70 - 99 mg/dL 409  811  914   BUN 8 - 23 mg/dL 22  28  23    Creatinine 0.44 - 1.00 mg/dL 7.82  9.56  2.13   Sodium 135 - 145 mmol/L 135  136  133   Potassium 3.5 - 5.1 mmol/L 3.8  4.8  4.2   Chloride 98 - 111 mmol/L 106  103  104   CO2 22 - 32 mmol/L 23   24   Calcium 8.9 - 10.3 mg/dL 8.9   8.9   Total Protein 6.5 - 8.1 g/dL   5.8   Total Bilirubin 0.3 - 1.2 mg/dL   0.6   Alkaline Phos 38 - 126 U/L   72   AST 15 - 41 U/L   14   ALT 0 - 44 U/L   12        Micro Results Recent Results (from the past 240 hour(s))  Blood culture (routine x 2)     Status: None   Collection Time: 07/04/23  9:38 PM   Specimen: BLOOD RIGHT ARM  Result Value Ref Range Status   Specimen Description BLOOD RIGHT ARM  Final   Special Requests   Final    BOTTLES DRAWN AEROBIC AND ANAEROBIC Blood Culture adequate volume   Culture   Final    NO GROWTH 5 DAYS Performed at [facility], [address] [place], [place] [address]    Report Status 07/09/2023 FINAL  Final  Culture, blood (Routine X 2) w Reflex to ID Panel     Status: None   Collection Time: 07/05/23 12:01 AM   Specimen: BLOOD  Result Value Ref Range Status   Specimen Description BLOOD BLOOD RIGHT ARM  Final   Special Requests   Final    BOTTLES DRAWN AEROBIC AND ANAEROBIC Blood Culture adequate volume   Culture   Final    NO GROWTH  5 DAYS Performed at [facility], [address], [place], [place] [address]    Report Status 07/10/2023 FINAL  Final  Blood Culture (routine x 2)     Status: None (Preliminary result)   Collection Time: 07/10/23  5:28 PM   Specimen: BLOOD  Result Value Ref Range Status   Specimen Description BLOOD BLOOD LEFT FOREARM  Final   Special Requests   Final    BOTTLES DRAWN AEROBIC AND ANAEROBIC Blood Culture results may not be optimal due to an excessive volume of blood received in culture bottles   Culture  Final    NO GROWTH < 24 HOURS Performed at [facility], [address], [place], [place] [address]    Report Status PENDING  Incomplete  Resp panel by RT-PCR (RSV, Flu A&B, Covid) Anterior Nasal Swab     Status: None   Collection Time: 07/10/23  6:18 PM   Specimen: Anterior Nasal Swab  Result Value Ref Range Status   SARS Coronavirus 2 by RT PCR NEGATIVE NEGATIVE Final    Comment: (NOTE) SARS-CoV-2 target nucleic acids are NOT DETECTED.  The SARS-CoV-2 RNA is generally detectable in upper respiratory specimens during the acute phase of infection. The lowest concentration of SARS-CoV-2 viral copies this assay can detect is 138 copies/mL. A negative result does not preclude SARS-Cov-2 infection and should not be used as the sole basis for treatment or other patient management decisions. A negative result may occur with  improper specimen collection/handling, submission of specimen other than nasopharyngeal swab, presence of viral mutation(s) within the areas targeted by this assay, and inadequate number of viral copies(<138 copies/mL). A negative result must be combined with clinical observations, patient history, and epidemiological information. The expected result is Negative.  Fact Sheet for Patients:  BloggerCourse.com  Fact Sheet for Healthcare Providers:  SeriousBroker.it  This test is no t yet approved or cleared by the  [place] FDA and  has been authorized for detection and/or diagnosis of SARS-CoV-2 by FDA under an Emergency Use Authorization (EUA). This EUA will remain  in effect (meaning this test can be used) for the duration of the COVID-19 declaration under Section 564(b)(1) of the Act, 21 U.S.C.section 360bbb-3(b)(1), unless the authorization is terminated  or revoked sooner.       Influenza A by PCR NEGATIVE NEGATIVE Final   Influenza B by PCR NEGATIVE NEGATIVE Final    Comment: (NOTE) The Xpert Xpress SARS-CoV-2/FLU/RSV plus assay is intended as an aid in the diagnosis of influenza from Nasopharyngeal swab specimens and should not be used as a sole basis for treatment. Nasal washings and aspirates are unacceptable for Xpert Xpress SARS-CoV-2/FLU/RSV testing.  Fact Sheet for Patients: BloggerCourse.com  Fact Sheet for Healthcare Providers: SeriousBroker.it  This test is not yet approved or cleared by the [place] FDA and has been authorized for detection and/or diagnosis of SARS-CoV-2 by FDA under an Emergency Use Authorization (EUA). This EUA will remain in effect (meaning this test can be used) for the duration of the COVID-19 declaration under Section 564(b)(1) of the Act, 21 U.S.C. section 360bbb-3(b)(1), unless the authorization is terminated or revoked.     Resp Syncytial Virus by PCR NEGATIVE NEGATIVE Final    Comment: (NOTE) Fact Sheet for Patients: BloggerCourse.com  Fact Sheet for Healthcare Providers: SeriousBroker.it  This test is not yet approved or cleared by the [place] FDA and has been authorized for detection and/or diagnosis of SARS-CoV-2 by FDA under an Emergency Use Authorization (EUA). This EUA will remain in effect (meaning this test can be used) for the duration of the COVID-19 declaration under Section 564(b)(1) of the Act, 21 U.S.C. section  360bbb-3(b)(1), unless the authorization is terminated or revoked.  Performed at [facility], [address], [place], [place] [address]   Blood Culture (routine x 2)     Status: None (Preliminary result)   Collection Time: 07/10/23  6:18 PM   Specimen: BLOOD  Result Value Ref Range Status   Specimen Description BLOOD BLOOD LEFT ARM  Final   Special Requests   Final    BOTTLES  DRAWN AEROBIC AND ANAEROBIC Blood Culture results may not be optimal due to an excessive volume of blood received in culture bottles   Culture   Final    NO GROWTH < 12 HOURS Performed at [facility], [address], [place], [place] [address]    Report Status PENDING  Incomplete    Radiology Reports CT Head Wo Contrast  Result Date: 07/10/2023 CLINICAL DATA:  Altered mental status. EXAM: CT HEAD WITHOUT CONTRAST TECHNIQUE: Contiguous axial images were obtained from the base of the skull through the vertex without intravenous contrast. RADIATION DOSE REDUCTION: This exam was performed according to the departmental dose-optimization program which includes automated exposure control, adjustment of the mA and/or kV according to patient size and/or use of iterative reconstruction technique. COMPARISON:  [name] 13, 2024 FINDINGS: Brain: There is moderate severity cerebral atrophy with widening of the extra-axial spaces and ventricular dilatation. There are areas of decreased attenuation within the white matter tracts of the supratentorial brain, consistent with microvascular disease changes. Vascular: Marked severity bilateral cavernous carotid artery calcification is seen. Skull: Normal. Negative for fracture or focal lesion. Sinuses/Orbits: No acute finding. Other: None. IMPRESSION: 1. Generalized cerebral atrophy with chronic white matter small vessel ischemic changes. 2. No acute intracranial abnormality. Electronically Signed   By: Aram Candela M.D.   On: 07/10/2023 20:10   DG Hand Complete Left  Result Date:  07/10/2023 CLINICAL DATA:  Question of sepsis to evaluate for abnormality. Pain and swelling of the wrist. EXAM: LEFT HAND - COMPLETE 3+ VIEW COMPARISON:  None. FINDINGS: Degenerative changes in the STT and radiocarpal joints as well as at the first carpometacarpal and interphalangeal joints. Calcifications in the radiocarpal space, scapholunate space and triangular fibrocartilage space, likely ligamentous calcification. Mild soft tissue swelling over the distal forearm. No evidence of acute fracture or dislocation. No focal bone lesions. No bone erosion or sclerosis to suggest osteomyelitis. Vascular calcifications. IMPRESSION: Degenerative changes in the left hand and wrist with likely ligamentous calcifications. No acute bony abnormalities. Electronically Signed   By: Burman Nieves M.D.   On: 07/10/2023 18:18   DG Chest Port 1 View  Result Date: 07/10/2023 CLINICAL DATA:  Question of sepsis to evaluate for abnormality. EXAM: PORTABLE CHEST 1 VIEW COMPARISON:  05/01/2023 FINDINGS: Heart size and pulmonary vascularity are normal. Lungs are clear. No pleural effusions. No pneumothorax. Mediastinal contours appear intact. Degenerative changes in the spine and shoulders. Postoperative change in the right shoulder. IMPRESSION: No active disease. Electronically Signed   By: Burman Nieves M.D.   On: 07/10/2023 18:16   DG Wrist Complete Right  Result Date: 07/10/2023 CLINICAL DATA:  Question of sepsis. Evaluate for abnormality. Right wrist is swollen and warm to touch. EXAM: RIGHT WRIST - COMPLETE 3+ VIEW COMPARISON:  None Available. FINDINGS: Diffuse bone demineralization. Degenerative changes involving the radiocarpal and STT joints. Calcifications in the right wrist in the scapholunate and radiocarpal row as well as in the region of the triangular fibrocartilage and dorsal joint. These changes may represent ligamentous calcification or degenerative calcification. Loose bodies may be present. No bone  destruction or cortical erosion to suggest evidence of osteomyelitis. Diffuse soft tissue swelling. Vascular calcifications. IMPRESSION: Diffuse demineralization and degenerative changes in the right wrist. Soft tissue calcifications may represent ligamentous calcification or degenerative calcification with possible loose bodies. No evidence of osteomyelitis. Soft tissue swelling. Electronically Signed   By: Burman Nieves M.D.   On: 07/10/2023 18:16    SIGNED: Kendell Bane, MD, FHM. FAAFP.  [facility] - [facility] Time spent - 35 min.  In seeing, evaluating and examining the patient. Reviewing medical records, labs, drawn plan of care. [facility],  Pager (please use [email or web address] to page/ text) Please use Epic Secure Chat for non-urgent communication (7AM-7PM)  If 7PM-7AM, please contact night-coverage [email or web address], 07/11/2023, 11:34 AM

## 2023-07-11 NOTE — Hospital Course (Signed)
[name] is a 89 y.o. female with medical history significant for CAD, Dm II - peripheral neuropathy, diastolic dysfunction,HTN and CVA, who presented to the emergency room with acute onset of worsening altered mental status.   The patient was recently admitted here for UTI and acute metabolic encephalopathy, with some improvement over the last couple of days  The patient was fairly confused and thought this year is 26.  Her daughter reported that she missed 2 doses of her oral antibiotic Omnicef over the last couple of days.  Per her Home health nurse noted she was fairly lethargic and confused and was complaining of severe pain and swelling in the right wrist.  No fever or chills.  No worsening urinary symptoms.    ED Course:  Temp. was 95.7 with otherwise normal vital signs.  Labs revealed blood glucose of 210 and BUN of 28.  CBC showed hemoglobin of 11.1 and hematocrit 34.4.  Repeat H&H were 11.6 and 34.  Lactic acid was 1.5 and later the same.  High sensitive troponin I was 12 and later 11.  EKG as reviewed by me : EKG showed normal sinus rhythm with a rate of 77 with right bundle branch block and Q waves anterolaterally and inferiorly.  Imaging: Right wrist x-ray showed degenerative changes in the right wrist with diffuse demineralization, soft tissue calcification  Left hand x-ray showed degenerative changes in the left hand and wrist with likely ligamentous calcifications with no acute bony abnormalities.   The patient was given IV cefepime, vancomycin and Flagyl was started on IV normal saline at 150 mL/h.       IMPRESSION AND PLAN:   Assessment and Plan:  Acute metabolic encephalopathy - Likely due to infection, persistent UTI -Continue monitoring closely, treating underlying cause of -Continue neurochecks -Patient has improved .   Acute lower UTI - Will continue IV Rocephin and follow urine culture. - Her last urine culture was negative on 07/05/2023. - She had  grown Enterobacter, low ACE on 05/01/2023 and Proteus Mirabilis on 02/11/2023.   Type 2 diabetes mellitus with peripheral neuropathy (HCC) - The patient will be placed on supplemental coverage with NovoLog. - We will hold off metformin. - We will continue Neurontin.   CAD (coronary artery disease) of artery bypass graft - We will continue statin therapy, Toprol-XL, aspirin and Brilinta.   Dyslipidemia - We will continue statin therapy.   Depression - We will continue Zoloft.

## 2023-07-11 NOTE — Plan of Care (Signed)
  Problem: Acute Rehab PT Goals(only PT should resolve) Goal: Pt Will Go Supine/Side To Sit Outcome: Progressing Flowsheets (Taken 07/11/2023 1400) Pt will go Supine/Side to Sit:  with minimal assist  with moderate assist Goal: Patient Will Perform Sitting Balance Outcome: Progressing Flowsheets (Taken 07/11/2023 1400) Patient will perform sitting balance:  with modified independence  with supervision Goal: Patient Will Transfer Sit To/From Stand Outcome: Progressing Flowsheets (Taken 07/11/2023 1400) Patient will transfer sit to/from stand: with moderate assist Goal: Pt Will Transfer Bed To Chair/Chair To Bed Outcome: Progressing Flowsheets (Taken 07/11/2023 1400) Pt will Transfer Bed to Chair/Chair to Bed: with mod assist   2:01 PM, 07/11/23 Ocie Bob, MPT Physical Therapist with [facility] [phone] office [phone] mobile phone

## 2023-07-12 DIAGNOSIS — M109 Gout, unspecified: Secondary | ICD-10-CM | POA: Diagnosis not present

## 2023-07-12 DIAGNOSIS — I10 Essential (primary) hypertension: Secondary | ICD-10-CM | POA: Diagnosis not present

## 2023-07-12 DIAGNOSIS — G9009 Other idiopathic peripheral autonomic neuropathy: Secondary | ICD-10-CM | POA: Diagnosis not present

## 2023-07-12 DIAGNOSIS — G9341 Metabolic encephalopathy: Secondary | ICD-10-CM | POA: Diagnosis not present

## 2023-07-12 DIAGNOSIS — R1319 Other dysphagia: Secondary | ICD-10-CM | POA: Diagnosis not present

## 2023-07-12 DIAGNOSIS — I2510 Atherosclerotic heart disease of native coronary artery without angina pectoris: Secondary | ICD-10-CM | POA: Diagnosis not present

## 2023-07-12 DIAGNOSIS — E1165 Type 2 diabetes mellitus with hyperglycemia: Secondary | ICD-10-CM | POA: Diagnosis not present

## 2023-07-12 DIAGNOSIS — M71432 Calcium deposit in bursa, left wrist: Secondary | ICD-10-CM | POA: Diagnosis not present

## 2023-07-12 DIAGNOSIS — E559 Vitamin D deficiency, unspecified: Secondary | ICD-10-CM | POA: Diagnosis not present

## 2023-07-12 DIAGNOSIS — R54 Age-related physical debility: Secondary | ICD-10-CM | POA: Diagnosis not present

## 2023-07-12 DIAGNOSIS — E119 Type 2 diabetes mellitus without complications: Secondary | ICD-10-CM | POA: Diagnosis not present

## 2023-07-12 DIAGNOSIS — E785 Hyperlipidemia, unspecified: Secondary | ICD-10-CM | POA: Diagnosis not present

## 2023-07-12 DIAGNOSIS — R5381 Other malaise: Secondary | ICD-10-CM | POA: Diagnosis not present

## 2023-07-12 DIAGNOSIS — N390 Urinary tract infection, site not specified: Secondary | ICD-10-CM | POA: Diagnosis not present

## 2023-07-12 DIAGNOSIS — R296 Repeated falls: Secondary | ICD-10-CM | POA: Diagnosis not present

## 2023-07-12 DIAGNOSIS — Z79899 Other long term (current) drug therapy: Secondary | ICD-10-CM | POA: Diagnosis not present

## 2023-07-12 DIAGNOSIS — E1140 Type 2 diabetes mellitus with diabetic neuropathy, unspecified: Secondary | ICD-10-CM | POA: Diagnosis not present

## 2023-07-12 DIAGNOSIS — R339 Retention of urine, unspecified: Secondary | ICD-10-CM | POA: Diagnosis not present

## 2023-07-12 DIAGNOSIS — M6281 Muscle weakness (generalized): Secondary | ICD-10-CM | POA: Diagnosis not present

## 2023-07-12 DIAGNOSIS — Z8673 Personal history of transient ischemic attack (TIA), and cerebral infarction without residual deficits: Secondary | ICD-10-CM | POA: Diagnosis not present

## 2023-07-12 DIAGNOSIS — E1142 Type 2 diabetes mellitus with diabetic polyneuropathy: Secondary | ICD-10-CM | POA: Diagnosis not present

## 2023-07-12 DIAGNOSIS — N3281 Overactive bladder: Secondary | ICD-10-CM | POA: Diagnosis not present

## 2023-07-12 DIAGNOSIS — Z7401 Bed confinement status: Secondary | ICD-10-CM | POA: Diagnosis not present

## 2023-07-12 LAB — URINE CULTURE: Culture: <10000 — AB

## 2023-07-12 LAB — GLUCOSE, CAPILLARY
Glucose-Capillary: 146 mg/dL — ABNORMAL HIGH (ref 70–99)
Glucose-Capillary: 270 mg/dL — ABNORMAL HIGH (ref 70–99)
Glucose-Capillary: 253 mg/dL — ABNORMAL HIGH (ref 70–99)

## 2023-07-12 LAB — CULTURE, BLOOD (ROUTINE X 2)
Culture: NO GROWTH
Culture: NO GROWTH

## 2023-07-12 MED ORDER — CIPROFLOXACIN HCL 500 MG PO TABS: 500.0000 mg | ORAL_TABLET | Freq: Two times a day (BID) | ORAL | 0 refills | Status: AC

## 2023-07-12 MED ORDER — LACTINEX PO CHEW: 1.0000 | CHEWABLE_TABLET | Freq: Three times a day (TID) | ORAL | 0 refills | Status: AC

## 2023-07-12 NOTE — Evaluation (Signed)
Occupational Therapy Evaluation Patient Details Name: [name] MRN: [id number] DOB: 1929/07/27 Today's Date: 07/12/2023   History of Present Illness [name] is a 89 y.o. female with medical history significant for coronary artery disease, type 2 diabetes mellitus with peripheral neuropathy, diastolic dysfunction, hypertension and CVA, who presented to the emergency room with acute onset of worsening altered mental status.  The patient was recently admitted here for UTI and acute metabolic encephalopathy, with improvement over the last couple of days until today when this happened.  The patient was fairly confused today and thought this year is 40.  Her daughter reported that she missed 2 doses of her oral antibiotic Omnicef over the last couple of days.  Her Home health nurse noted she was fairly lethargic and confused and was complaining of severe pain and swelling in the right wrist.  No falls or injuries were reported.  No nausea or vomiting or abdominal pain.  No fever or chills.  No worsening urinary symptoms.  No cough or wheezing or hemoptysis.  No chest pain or palpitations.   Clinical Impression   Pt agreeable to OT evaluation. Pt was awake and alert throughout session today with no apparent confusion/combativeness. She was able to complete bed mobility with min assist to push up from side lying to sitting EOB with HOB flat. She demonstrated 50% ROM in LUE and had very poor grip strength, about 75% ROM in RUE with good grip strength. PT completed sliding board t/f from bed to chair with mod assist. Pt combed hair with set up and required assist to open containers/lids and set up meal for breakfast but was able to self feed independently. Pt would benefit from continued OT services while in acute setting.      Recommendations for follow up therapy are one component of a multi-disciplinary discharge planning process, led by the attending physician.  Recommendations may be  updated based on patient status, additional functional criteria and insurance authorization.   Assistance Recommended at Discharge Frequent or constant Supervision/Assistance  Patient can return home with the following A lot of help with walking and/or transfers;A lot of help with bathing/dressing/bathroom;Assistance with cooking/housework    Functional Status Assessment  Patient has had a recent decline in their functional status and demonstrates the ability to make significant improvements in function in a reasonable and predictable amount of time.  Equipment Recommendations  None recommended by OT           Precautions / Restrictions Precautions Precautions: Fall Restrictions Weight Bearing Restrictions: No      Mobility Bed Mobility Overal bed mobility: Needs Assistance Bed Mobility: Supine to Sit     Supine to sit: Min assist     General bed mobility comments: increased time, labored movement with some limited use of RUE due to wrist/hand weakness    Transfers Overall transfer level: Needs assistance Equipment used: 1 person hand held assist Transfers: Bed to chair/wheelchair/BSC, Sit to/from Stand Sit to Stand: Mod assist Stand pivot transfers: Mod assist Squat pivot transfers: Mod assist, Max assist      Lateral/Scoot Transfers: Mod assist, Max assist General transfer comment: Patient had diffiuclty scooting laterally and able to use sliding board with assist from therapist, required mod assist stand pivot to transfer to chair      Balance Overall balance assessment: Needs assistance Sitting-balance support: Feet supported, No upper extremity supported Sitting balance-Leahy Scale: Good Sitting balance - Comments: seated at EOB Postural control: Posterior lean  Standing balance comment: unable to fully stand                           ADL either performed or assessed with clinical judgement   ADL Overall ADL's : Needs  assistance/impaired Eating/Feeding: Minimal assistance;Sitting;Set up Eating/Feeding Details (indicate cue type and reason): assist to open containers, remove lids, hold coffee cup Grooming: Set up;Sitting Grooming Details (indicate cue type and reason): pt combed hair while seated EOB with set up Upper Body Bathing: Moderate assistance;Sitting   Lower Body Bathing: Maximal assistance;Sitting/lateral leans   Upper Body Dressing : Moderate assistance;Sitting   Lower Body Dressing: Maximal assistance;Sitting/lateral leans   Toilet Transfer: Maximal assistance;BSC/3in1   Toileting- Clothing Manipulation and Hygiene: Maximal assistance;Sitting/lateral lean   Tub/ Shower Transfer: Maximal assistance;Transfer board;Shower seat   Functional mobility during ADLs: Maximal assistance       Vision Baseline Vision/History: 0 No visual deficits Ability to See in Adequate Light: 0 Adequate Patient Visual Report: No change from baseline Vision Assessment?: No apparent visual deficits                Pertinent Vitals/Pain Pain Assessment Pain Assessment: No/denies pain Faces Pain Scale: No hurt     Hand Dominance Right   Extremity/Trunk Assessment Upper Extremity Assessment Upper Extremity Assessment: RUE deficits/detail RUE Deficits / Details: noted swollen R hand, 50% ROM, unable to grip OT hand RUE:  (limited ROM, swelling, poor grip strength) RUE Coordination: decreased fine motor;decreased gross motor   Lower Extremity Assessment Lower Extremity Assessment: Defer to PT evaluation   Cervical / Trunk Assessment Cervical / Trunk Assessment: Normal   Communication Communication Communication: HOH   Cognition Arousal/Alertness: Awake/alert Behavior During Therapy: WFL for tasks assessed/performed Overall Cognitive Status: History of cognitive impairments - at baseline                                                        Home Living  Family/patient expects to be discharged to:: Private residence Living Arrangements: Children Available Help at Discharge: Family;Available 24 hours/day Type of Home: House Home Access: Ramped entrance     Home Layout: Two level Alternate Level Stairs-Number of Steps: patient does not go upstairs   Bathroom Shower/Tub: Producer, television/film/video: Handicapped height Bathroom Accessibility: Yes How Accessible: Accessible via wheelchair Home Equipment: BSC/3in1;Shower seat;Grab bars - toilet;Grab bars - tub/shower;Wheelchair - manual   Additional Comments: Per chart review; pt is a poor historian.      Prior Functioning/Environment Prior Level of Function : Needs assist       Physical Assist : Mobility (physical);ADLs (physical) Mobility (physical): Bed mobility;Transfers ADLs (physical): IADLs Mobility Comments: non-ambulatory, stand pivot transfers Mod Independent ADLs Comments: assisted by family, home aides from 9-5 AM and 6-8 PM x 5 days/week        OT Problem List: Decreased strength;Impaired balance (sitting and/or standing)      OT Treatment/Interventions: Self-care/ADL training;Therapeutic exercise;DME and/or AE instruction;Therapeutic activities;Patient/family education    OT Goals(Current goals can be found in the care plan section) Acute Rehab OT Goals Patient Stated Goal: to return home or rehab OT Goal Formulation: With patient Time For Goal Achievement: 07/26/23 Potential to Achieve Goals: Good ADL Goals Pt Will Perform Upper Body Bathing: with min assist;sitting Pt  Will Perform Lower Body Dressing: with min assist;with adaptive equipment;sitting/lateral leans Pt Will Transfer to Toilet: with mod assist;with transfer board;bedside commode  OT Frequency: Min 2X/week    Co-evaluation   Reason for Co-Treatment: To address functional/ADL transfers PT goals addressed during session: Mobility/safety with mobility;Balance;Strengthening/ROM OT goals  addressed during session: ADL's and self-care      AM-PAC OT "6 Clicks" Daily Activity     Outcome Measure Help from another person eating meals?: A Little Help from another person taking care of personal grooming?: A Little Help from another person toileting, which includes using toliet, bedpan, or urinal?: A Lot Help from another person bathing (including washing, rinsing, drying)?: A Lot Help from another person to put on and taking off regular upper body clothing?: A Little Help from another person to put on and taking off regular lower body clothing?: A Lot 6 Click Score: 15   End of Session Nurse Communication: Mobility status  Activity Tolerance: Patient tolerated treatment well Patient left: in chair;with call bell/phone within reach;with chair alarm set  OT Visit Diagnosis: Muscle weakness (generalized) (M62.81);Unsteadiness on feet (R26.81)                Time: 1191-4782 OT Time Calculation (min): 42 min Charges:  OT General Charges $OT Visit: 1 Visit OT Treatments $Self Care/Home Management : 23-37 mins   Bevelyn Ngo, OTR/L 07/12/2023, 9:19 AM

## 2023-07-12 NOTE — Plan of Care (Signed)
  Problem: Acute Rehab OT Goals (only OT should resolve) Goal: Pt. Will Perform Upper Body Bathing Flowsheets (Taken 07/12/2023 0917) Pt Will Perform Upper Body Bathing:  with min assist  sitting Goal: Pt. Will Perform Lower Body Dressing Flowsheets (Taken 07/12/2023 0917) Pt Will Perform Lower Body Dressing:  with min assist  with adaptive equipment  sitting/lateral leans Goal: Pt. Will Transfer To Toilet Flowsheets (Taken 07/12/2023 [phone]) Pt Will Transfer to Toilet:  with mod assist  with transfer board  bedside commode    Lurena Joiner, OTR/L

## 2023-07-12 NOTE — Progress Notes (Signed)
Physical Therapy Treatment Patient Details Name: [name] MRN: [id number] DOB: [birth year] Today's Date: 07/12/2023   History of Present Illness [name] is a 89 y.o. female with medical history significant for coronary artery disease, type 2 diabetes mellitus with peripheral neuropathy, diastolic dysfunction, hypertension and CVA, who presented to the emergency room with acute onset of worsening altered mental status.  The patient was recently admitted here for UTI and acute metabolic encephalopathy, with improvement over the last couple of days until today when this happened.  The patient was fairly confused today and thought this year is 3.  Her daughter reported that she missed 2 doses of her oral antibiotic Omnicef over the last couple of days.  Her Home health nurse noted she was fairly lethargic and confused and was complaining of severe pain and swelling in the right wrist.  No falls or injuries were reported.  No nausea or vomiting or abdominal pain.  No fever or chills.  No worsening urinary symptoms.  No cough or wheezing or hemoptysis.  No chest pain or palpitations.    PT Comments  Pt tolerated treatment well. Patient able to transfer from bed to chair but had diffiuclty scooting laterally using sliding board with assist from therapist, required mod assist to stand pivot to transfer to chair. Pt tolerated sitting up in chair at end of therapy. Patient will benefit from continued skilled physical therapy in hospital and recommended venue below to increase strength, balance, endurance for safe ADLs and gait.     Assistance Recommended at Discharge Set up Supervision/Assistance  If plan is discharge home, recommend the following:  Can travel by private vehicle    A lot of help with walking and/or transfers;A lot of help with bathing/dressing/bathroom;Assistance with cooking/housework;Help with stairs or ramp for entrance   Yes  Equipment Recommendations  None  recommended by PT    Recommendations for Other Services       Precautions / Restrictions Precautions Precautions: Fall Restrictions Weight Bearing Restrictions: No     Mobility  Bed Mobility Overal bed mobility: Needs Assistance Bed Mobility: Supine to Sit     Supine to sit: Min assist     General bed mobility comments: increased time, labored movement with some limited use of RUE due to wrist/hand weakness    Transfers Overall transfer level: Needs assistance Equipment used: 1 person hand held assist Transfers: Bed to chair/wheelchair/BSC, Sit to/from Stand Sit to Stand: Mod assist Stand pivot transfers: Mod assist         General transfer comment: Patient had diffiuclty scooting laterally and able to use sliding board with assist from therapist, required mod assist stand pivot to transfer to chair    Ambulation/Gait                   Stairs             Wheelchair Mobility     Tilt Bed    Modified Rankin (Stroke Patients Only)       Balance Overall balance assessment: Needs assistance Sitting-balance support: Feet supported, No upper extremity supported Sitting balance-Leahy Scale: Good Sitting balance - Comments: seated at EOB       Standing balance comment: Did not assess standing balance today                            Cognition Arousal/Alertness: Awake/alert Behavior During Therapy: WFL for tasks assessed/performed Overall Cognitive Status: History  of cognitive impairments - at baseline                                          Exercises General Exercises - Lower Extremity Ankle Circles/Pumps: AROM, Right, Left, 10 reps Long Arc Quad: AROM, Right, Left, 10 reps Hip Flexion/Marching: AROM, Right, Left, 10 reps Toe Raises: AROM, Right, Left, 10 reps Heel Raises: AROM, Right, Left, 10 reps    General Comments        Pertinent Vitals/Pain Pain Assessment Pain Assessment: Faces Faces Pain  Scale: No hurt    Home Living                          Prior Function            PT Goals (current goals can now be found in the care plan section) Acute Rehab PT Goals Patient Stated Goal: Return home with assist from family PT Goal Formulation: With patient/family Time For Goal Achievement: 07/25/23 Potential to Achieve Goals: Good Progress towards PT goals: Progressing toward goals    Frequency    Min 3X/week      PT Plan      Co-evaluation PT/OT/SLP Co-Evaluation/Treatment: Yes Reason for Co-Treatment: To address functional/ADL transfers PT goals addressed during session: Mobility/safety with mobility;Balance;Strengthening/ROM OT goals addressed during session: ADL's and self-care      AM-PAC PT "6 Clicks" Mobility   Outcome Measure  Help needed turning from your back to your side while in a flat bed without using bedrails?: A Little Help needed moving from lying on your back to sitting on the side of a flat bed without using bedrails?: A Little Help needed moving to and from a bed to a chair (including a wheelchair)?: A Little Help needed standing up from a chair using your arms (e.g., wheelchair or bedside chair)?: A Lot Help needed to walk in hospital room?: Total Help needed climbing 3-5 steps with a railing? : Total 6 Click Score: 13    End of Session   Activity Tolerance: Patient tolerated treatment well;Patient limited by fatigue Patient left: in chair;with call bell/phone within reach Nurse Communication: Mobility status PT Visit Diagnosis: Unsteadiness on feet (R26.81);Other abnormalities of gait and mobility (R26.89);Muscle weakness (generalized) (M62.81)     Time: 8119-1478 PT Time Calculation (min) (ACUTE ONLY): 30 min  Charges:    $Therapeutic Exercise: 8-22 mins $Therapeutic Activity: 8-22 mins PT General Charges $$ ACUTE PT VISIT: 1 Visit                     [name] SPT [place], DPT Program

## 2023-07-12 NOTE — Progress Notes (Signed)
Patient discharged to [facility], transported by EMS. Discharge summary placed in discharge packet to give to facility. Belongings sent with patient.

## 2023-07-12 NOTE — TOC Transition Note (Signed)
Transition of Care [facility]) - CM/SW Discharge Note   Patient Details  Name: [name] MRN: [id number] Date of Birth: November 28, 1929  Transition of Care [facility] CM/SW Contact:  Leitha Bleak, RN Phone Number: 07/12/2023, 3:18 PM   Clinical Narrative:   INS Auth  received from [facility]. Shane at [facility] updated and gave bed number. RN calling report. TOC printed med necessity and will call EMS.    Final next level of care: Skilled Nursing Facility Barriers to Discharge: Barriers Resolved   Discharge Placement        [facility]     Name of family member notified: Daughter Patient and family notified of of transfer: 07/12/23  Discharge Plan and Services Additional resources added to the After Visit Summary for   In-house Referral: Clinical Social Work               Social Determinants of Health (SDOH) Interventions SDOH Screenings   Food Insecurity: No Food Insecurity (07/11/2023)  Housing: Low Risk  (07/11/2023)  Transportation Needs: No Transportation Needs (07/11/2023)  Utilities: Not At Risk (07/11/2023)  Physical Activity: Inactive (02/08/2019)  Social Connections: Unknown (02/08/2019)  Stress: No Stress Concern Present (02/08/2019)  Tobacco Use: Low Risk  (07/10/2023)     Readmission Risk Interventions    07/11/2023    3:31 PM 07/06/2023    3:49 PM 02/12/2023   11:26 AM  Readmission Risk Prevention Plan  Transportation Screening Complete Complete Complete  PCP or Specialist Appt within 3-5 Days  Complete   HRI or Home Care Consult  Complete Complete  Social Work Consult for Recovery Care Planning/Counseling  Complete Complete  Palliative Care Screening  Not Applicable Not Applicable  Medication Review [employer]) Complete Complete Complete  PCP or Specialist appointment within 3-5 days of discharge Complete    HRI or Home Care Consult Complete    SW Recovery Care/Counseling Consult Complete    Palliative Care Screening Not Complete    Skilled Nursing  Facility Complete

## 2023-07-12 NOTE — Care Management Important Message (Signed)
Important Message  Patient Details  Name: [name] MRN: [id number] Date of Birth: October 01, 1929   Medicare Important Message Given:  N/A - LOS <3 / Initial given by admissions     Corey Harold 07/12/2023, 2:05 PM

## 2023-07-12 NOTE — Discharge Summary (Signed)
Physician Discharge Summary   Patient: [name] MRN: [id number] DOB: [birth year]  Admit date:     07/10/2023  Discharge date: 07/12/23  Discharge Physician: Kendell Bane   PCP: Assunta Found, MD   Recommendations at discharge:  F/up with PCP in 2-4 weeks   Discharge Diagnoses: Principal Problem:   Acute metabolic encephalopathy Active Problems:   Acute lower UTI   Type 2 diabetes mellitus with peripheral neuropathy (HCC)   CAD (coronary artery disease) of artery bypass graft   Depression   Dyslipidemia   Altered mental status  Resolved Problems:   * No resolved hospital problems. *  Hospital Course: [name] is a 89 y.o. female with medical history significant for CAD, Dm II - peripheral neuropathy, diastolic dysfunction,HTN and CVA, who presented to the emergency room with acute onset of worsening altered mental status.   The patient was recently admitted here for UTI and acute metabolic encephalopathy, with some improvement over the last couple of days  The patient was fairly confused and thought this year is 37.  Her daughter reported that she missed 2 doses of her oral antibiotic Omnicef over the last couple of days.  Per her Home health nurse noted she was fairly lethargic and confused and was complaining of severe pain and swelling in the right wrist.  No fever or chills.  No worsening urinary symptoms.    ED Course:  Temp. was 95.7 with otherwise normal vital signs.  Labs revealed blood glucose of 210 and BUN of 28.  CBC showed hemoglobin of 11.1 and hematocrit 34.4.  Repeat H&H were 11.6 and 34.  Lactic acid was 1.5 and later the same.  High sensitive troponin I was 12 and later 11.  EKG as reviewed by me : EKG showed normal sinus rhythm with a rate of 77 with right bundle branch block and Q waves anterolaterally and inferiorly.  Imaging: Right wrist x-ray showed degenerative changes in the right wrist with diffuse demineralization, soft tissue  calcification  Left hand x-ray showed degenerative changes in the left hand and wrist with likely ligamentous calcifications with no acute bony abnormalities.   The patient was given IV cefepime, vancomycin and Flagyl was started on IV normal saline at 150 mL/h.   .   Acute lower UTI - Was On IV Rocephin  -urine culture < 10K growth >>> Changed to Po Cipro  - Her last urine culture was negative on 07/05/2023. - She had grown Enterobacter, low ACE on 05/01/2023 and Proteus Mirabilis on 02/11/2023.   Type 2 diabetes mellitus with peripheral neuropathy (HCC) - The patient will be placed on supplemental coverage with NovoLog. - cont. metformin. -  continue Neurontin.   CAD (coronary artery disease) of artery bypass graft - We will continue statin therapy, Toprol-XL, aspirin and Brilinta.   Dyslipidemia - We will continue statin therapy.   Depression - We will continue Zoloft.           Disposition: Skilled nursing facility Diet recommendation:  Discharge Diet Orders (From admission, onward)     Start     Ordered   07/12/23 0000  Diet - low sodium heart healthy        07/12/23 1134           Regular diet DISCHARGE MEDICATION: Allergies as of 07/12/2023       Reactions   Codeine Nausea And Vomiting   Glipizide    Hypoglycemia with glucoses recorded in the 20s necessitating glucagon  and D10 IV   Sulfa Antibiotics Other (See Comments)   Unspecified         Medication List     STOP taking these medications    cefdinir 300 MG capsule Commonly known as: OMNICEF       TAKE these medications    aspirin EC 81 MG tablet Take 1 tablet (81 mg total) by mouth daily. Swallow whole.   ciprofloxacin 500 MG tablet Commonly known as: Cipro Take 1 tablet (500 mg total) by mouth 2 (two) times daily for 5 days.   gabapentin 300 MG capsule Commonly known as: NEURONTIN Take 1 capsule (300 mg total) by mouth 2 (two) times daily. Take 300 mg three times daily What  changed:  how much to take additional instructions   lactobacillus acidophilus & bulgar chewable tablet Chew 1 tablet by mouth 3 (three) times daily with meals for 10 days.   metFORMIN 500 MG tablet Commonly known as: GLUCOPHAGE Take 500 mg by mouth 2 (two) times daily.   metoprolol succinate 50 MG 24 hr tablet Commonly known as: TOPROL-XL Take 50 mg by mouth at bedtime.   silodosin 8 MG Caps capsule Commonly known as: RAPAFLO Take 1 capsule (8 mg total) by mouth daily with breakfast.   simvastatin 20 MG tablet Commonly known as: ZOCOR Take 20 mg by mouth every evening.   ticagrelor 90 MG Tabs tablet Commonly known as: BRILINTA Take 1 tablet (90 mg total) by mouth 2 (two) times daily.   Trospium Chloride 60 MG Cp24 Take 1 capsule by mouth daily.   Vitamin D3 125 MCG (5000 UT) Caps Take 1 capsule (5,000 Units total) by mouth daily.   Zoloft 50 MG tablet Generic drug: sertraline Take 50 mg by mouth daily. Take with 25mg  dose to equal 75mg  daily.   sertraline 25 MG tablet Commonly known as: ZOLOFT Take 25 mg by mouth daily. Take with 50mg  dose to equal 75mg  daily.        Discharge Exam: Filed Weights   07/10/23 1704 07/10/23 2300  Weight: 73 kg 67.1 kg        General:  AAO x 3,  cooperative, no distress;   HEENT:  Normocephalic, PERRL, otherwise with in Normal limits   Neuro:  CNII-XII intact. , normal motor and sensation, reflexes intact   Lungs:   Clear to auscultation BL, Respirations unlabored,  No wheezes / crackles  Cardio:    S1/S2, RRR, No murmure, No Rubs or Gallops   Abdomen:  Soft, non-tender, bowel sounds active all four quadrants, no guarding or peritoneal signs.  Muscular  skeletal:  Limited exam - Sever global generalized weaknesses - in bed, Bed bound - able to move all 4 extremities,   2+ pulses,  symmetric, No pitting edema  Skin:  Dry, warm to touch, negative for any Rashes,  Wounds: Please see nursing documentation  Pressure Injury  02/11/23 Buttocks Left Stage 2 -  Partial thickness loss of dermis presenting as a shallow open injury with a red, pink wound bed without slough. 0.5 cm x 3 cm x 0 cm, shallow pink base, looks like prior site of skin tear (Active)  02/11/23 1200  Location: Buttocks  Location Orientation: Left  Staging: Stage 2 -  Partial thickness loss of dermis presenting as a shallow open injury with a red, pink wound bed without slough.  Wound Description (Comments): 0.5 cm x 3 cm x 0 cm, shallow pink base, looks like prior site of skin tear  Present on Admission: Yes     Pressure Injury 02/11/23 Buttocks Right Stage 2 -  Partial thickness loss of dermis presenting as a shallow open injury with a red, pink wound bed without slough. 0.5cm x 1 cm - shallow pink base, no drainage (Active)  02/11/23 1200  Location: Buttocks  Location Orientation: Right  Staging: Stage 2 -  Partial thickness loss of dermis presenting as a shallow open injury with a red, pink wound bed without slough.  Wound Description (Comments): 0.5cm x 1 cm - shallow pink base, no drainage  Present on Admission: Yes          Condition at discharge: fair  The results of significant diagnostics from this hospitalization (including imaging, microbiology, ancillary and laboratory) are listed below for reference.   Imaging Studies: CT Head Wo Contrast  Result Date: 07/10/2023 CLINICAL DATA:  Altered mental status. EXAM: CT HEAD WITHOUT CONTRAST TECHNIQUE: Contiguous axial images were obtained from the base of the skull through the vertex without intravenous contrast. RADIATION DOSE REDUCTION: This exam was performed according to the departmental dose-optimization program which includes automated exposure control, adjustment of the mA and/or kV according to patient size and/or use of iterative reconstruction technique. COMPARISON:  July 06, 2023 FINDINGS: Brain: There is moderate severity cerebral atrophy with widening of the extra-axial  spaces and ventricular dilatation. There are areas of decreased attenuation within the white matter tracts of the supratentorial brain, consistent with microvascular disease changes. Vascular: Marked severity bilateral cavernous carotid artery calcification is seen. Skull: Normal. Negative for fracture or focal lesion. Sinuses/Orbits: No acute finding. Other: None. IMPRESSION: 1. Generalized cerebral atrophy with chronic white matter small vessel ischemic changes. 2. No acute intracranial abnormality. Electronically Signed   By: Aram Candela M.D.   On: 07/10/2023 20:10   DG Hand Complete Left  Result Date: 07/10/2023 CLINICAL DATA:  Question of sepsis to evaluate for abnormality. Pain and swelling of the wrist. EXAM: LEFT HAND - COMPLETE 3+ VIEW COMPARISON:  None. FINDINGS: Degenerative changes in the STT and radiocarpal joints as well as at the first carpometacarpal and interphalangeal joints. Calcifications in the radiocarpal space, scapholunate space and triangular fibrocartilage space, likely ligamentous calcification. Mild soft tissue swelling over the distal forearm. No evidence of acute fracture or dislocation. No focal bone lesions. No bone erosion or sclerosis to suggest osteomyelitis. Vascular calcifications. IMPRESSION: Degenerative changes in the left hand and wrist with likely ligamentous calcifications. No acute bony abnormalities. Electronically Signed   By: Burman Nieves M.D.   On: 07/10/2023 18:18   DG Chest Port 1 View  Result Date: 07/10/2023 CLINICAL DATA:  Question of sepsis to evaluate for abnormality. EXAM: PORTABLE CHEST 1 VIEW COMPARISON:  05/01/2023 FINDINGS: Heart size and pulmonary vascularity are normal. Lungs are clear. No pleural effusions. No pneumothorax. Mediastinal contours appear intact. Degenerative changes in the spine and shoulders. Postoperative change in the right shoulder. IMPRESSION: No active disease. Electronically Signed   By: Burman Nieves M.D.   On:  07/10/2023 18:16   DG Wrist Complete Right  Result Date: 07/10/2023 CLINICAL DATA:  Question of sepsis. Evaluate for abnormality. Right wrist is swollen and warm to touch. EXAM: RIGHT WRIST - COMPLETE 3+ VIEW COMPARISON:  None Available. FINDINGS: Diffuse bone demineralization. Degenerative changes involving the radiocarpal and STT joints. Calcifications in the right wrist in the scapholunate and radiocarpal row as well as in the region of the triangular fibrocartilage and dorsal joint. These changes may represent ligamentous calcification or degenerative  calcification. Loose bodies may be present. No bone destruction or cortical erosion to suggest evidence of osteomyelitis. Diffuse soft tissue swelling. Vascular calcifications. IMPRESSION: Diffuse demineralization and degenerative changes in the right wrist. Soft tissue calcifications may represent ligamentous calcification or degenerative calcification with possible loose bodies. No evidence of osteomyelitis. Soft tissue swelling. Electronically Signed   By: Burman Nieves M.D.   On: 07/10/2023 18:16   CT HEAD WO CONTRAST ( )  Result Date: 07/07/2023 CLINICAL DATA:  89 year old female with altered mental status. Encephalopathy. UTI, dehydration. EXAM: CT HEAD WITHOUT CONTRAST TECHNIQUE: Contiguous axial images were obtained from the base of the skull through the vertex without intravenous contrast. RADIATION DOSE REDUCTION: This exam was performed according to the departmental dose-optimization program which includes automated exposure control, adjustment of the mA and/or kV according to patient size and/or use of iterative reconstruction technique. COMPARISON:  Head CT 05/01/2023. FINDINGS: Study is mildly degraded by motion artifact despite repeated imaging attempts. Brain: Stable cerebral volume since May. No midline shift, ventriculomegaly, mass effect, evidence of mass lesion, intracranial hemorrhage or evidence of cortically based acute  infarction. Stable gray-white matter differentiation throughout the brain. Vascular: Calcified atherosclerosis at the skull base. No suspicious intracranial vascular hyperdensity. Skull: No acute osseous abnormality identified. Sinuses/Orbits: Visualized paranasal sinuses and mastoids are stable and well aerated. Other: No acute orbit or scalp soft tissue finding. Calcified scalp vessel atherosclerosis. IMPRESSION: Mildly motion degraded exam.  No acute intracranial abnormality. Electronically Signed   By: Odessa Fleming M.D.   On: 07/07/2023 13:05   CT ABDOMEN PELVIS WO CONTRAST  Result Date: 07/04/2023 CLINICAL DATA:  Vomiting and altered mental status. EXAM: CT ABDOMEN AND PELVIS WITHOUT CONTRAST TECHNIQUE: Multidetector CT imaging of the abdomen and pelvis was performed following the standard protocol without IV contrast. RADIATION DOSE REDUCTION: This exam was performed according to the departmental dose-optimization program which includes automated exposure control, adjustment of the mA and/or kV according to patient size and/or use of iterative reconstruction technique. COMPARISON:  June 28, 2023 FINDINGS: Lower chest: No acute abnormality. Hepatobiliary: No focal liver abnormality is seen. No gallstones, gallbladder wall thickening, or biliary dilatation. Pancreas: Unremarkable. No pancreatic ductal dilatation or surrounding inflammatory changes. Spleen: Normal in size without focal abnormality. Adrenals/Urinary Tract: A stable 11 mm diameter low-attenuation left adrenal mass is seen (approximately 15.10 Hounsfield units). The right adrenal gland is unremarkable. Kidneys are normal in size, without renal calculi or focal lesions. Mild, stable right extrarenal pelvis prominence is noted. Bladder is unremarkable. Stomach/Bowel: There is a small hiatal hernia. The appendix is surgically absent. No evidence of bowel wall thickening, distention, or inflammatory changes. Noninflamed diverticula are seen along the  junction of the descending and sigmoid colon. Vascular/Lymphatic: Aortic atherosclerosis. No enlarged abdominal or pelvic lymph nodes. Reproductive: Status post hysterectomy. The right adnexa is unremarkable. A 3.3 cm x 3.0 cm left adnexal cyst is seen. This is decreased in size when compared to the prior study. Other: No abdominal wall hernia or abnormality. No abdominopelvic ascites. Musculoskeletal: Stable degenerative and postoperative changes are seen within the lower lumbar spine. IMPRESSION: 1. Small hiatal hernia. 2. Colonic diverticulosis. 3. Stable left adrenal adenoma. No follow-up imaging is recommended. This recommendation follows ACR consensus guidelines: Management of Incidental Adrenal Masses: A White Paper of the ACR Incidental Findings Committee. J Am Coll Radiol 2017;14:1038-1044. 4. Benign-appearing simple left adnexal cyst. No follow-up imaging is recommended. This recommendation follows ACR consensus guidelines: White Paper of the ACR Incidental Findings Committee II on  Adnexal Findings. J Am Coll Radiol 2013:10:675-681. 5. Aortic atherosclerosis. Aortic Atherosclerosis (ICD10-I70.0). Electronically Signed   By: Aram Candela M.D.   On: 07/04/2023 22:19   CT ABDOMEN PELVIS W CONTRAST  Result Date: 06/28/2023 CLINICAL DATA:  Lower abdominal pain, diarrhea. EXAM: CT ABDOMEN AND PELVIS WITH CONTRAST TECHNIQUE: Multidetector CT imaging of the abdomen and pelvis was performed using the standard protocol following bolus administration of intravenous contrast. RADIATION DOSE REDUCTION: This exam was performed according to the departmental dose-optimization program which includes automated exposure control, adjustment of the mA and/or kV according to patient size and/or use of iterative reconstruction technique. CONTRAST:  OMNIPAQUE IOHEXOL 300 MG/ML  SOLN COMPARISON:  05/01/2023 FINDINGS: Lower chest: No acute abnormality. Hepatobiliary: No focal liver lesions. No gallstones, gallbladder  wall thickening or inflammation. No significant biliary ductal dilatation. Pancreas: Unremarkable. No pancreatic ductal dilatation or surrounding inflammatory changes. Spleen: Normal in size without focal abnormality. Adrenals/Urinary Tract: Normal right adrenal gland. Left adrenal nodule measures 1.1 cm. This is been present since 09/06/2009. Findings compatible with a benign adenoma. No follow-up imaging advised. No nephrolithiasis or hydronephrosis. Urinary bladder appears within normal limits. Stomach/Bowel: The stomach appears nondistended. Mild wall thickening involving the distal esophagus and stomach noted. The appendix is not visualized and may be surgically absent. No pathologic dilatation of the large or small bowel loops to suggest obstruction. Terminal ileum is visualized and is normal without wall thickening or inflammation. Mild diffuse mucosal enhancement and equivocal wall thickening involving the colon, which is most notable at the level of the sigmoid colon. No significant surrounding inflammatory fat stranding. Moderate retained stool identified within the rectum. Vascular/Lymphatic: Aortic atherosclerosis. No aneurysm. No signs of abdominopelvic adenopathy. Reproductive: Status post hysterectomy. Again seen is a triangular-shaped fluid density structure along the broad ligament which measures 4.1 x 3.7 cm, image 65/2. This is unchanged from the previous exam and is favored to represent a benign process. Other: No significant free fluid within the abdomen or pelvis. No signs of pneumoperitoneum. Musculoskeletal: No acute or suspicious osseous findings. Lumbar spondylosis. Status post right lateral plate and screw fixation with interbody fusion at L4-5. Marked degenerative disc disease noted at L2-3, L3-4 and L5-S1. IMPRESSION: 1. Mild diffuse mucosal enhancement and equivocal wall thickening involving the colon, most notable at the level of the sigmoid colon. Findings may reflect a mild colitis.  2. Mild wall thickening involving the distal esophagus and stomach, which may reflect esophagitis/gastritis. Clinical correlation advised. 3. Moderate retained stool within the rectum. 4. Stable fluid density, triangular-shaped structure along the broad ligament of the left hemipelvis status post hysterectomy. This is favored to represent a benign process. 5. Aortic Atherosclerosis (ICD10-I70.0). Electronically Signed   By: Signa Kell M.D.   On: 06/28/2023 10:32    Microbiology: Results for orders placed or performed during the hospital encounter of 07/10/23  Blood Culture (routine x 2)     Status: None (Preliminary result)   Collection Time: 07/10/23  5:28 PM   Specimen: BLOOD  Result Value Ref Range Status   Specimen Description BLOOD BLOOD LEFT FOREARM  Final   Special Requests   Final    BOTTLES DRAWN AEROBIC AND ANAEROBIC Blood Culture results may not be optimal due to an excessive volume of blood received in culture bottles   Culture   Final    NO GROWTH 2 DAYS Performed at [facility], [address], [place], [place] [address]    Report Status PENDING  Incomplete  Resp panel  by RT-PCR (RSV, Flu A&B, Covid) Anterior Nasal Swab     Status: None   Collection Time: 07/10/23  6:18 PM   Specimen: Anterior Nasal Swab  Result Value Ref Range Status   SARS Coronavirus 2 by RT PCR NEGATIVE NEGATIVE Final    Comment: (NOTE) SARS-CoV-2 target nucleic acids are NOT DETECTED.  The SARS-CoV-2 RNA is generally detectable in upper respiratory specimens during the acute phase of infection. The lowest concentration of SARS-CoV-2 viral copies this assay can detect is 138 copies/mL. A negative result does not preclude SARS-Cov-2 infection and should not be used as the sole basis for treatment or other patient management decisions. A negative result may occur with  improper specimen collection/handling, submission of specimen other than nasopharyngeal swab, presence of viral mutation(s) within  the areas targeted by this assay, and inadequate number of viral copies(<138 copies/mL). A negative result must be combined with clinical observations, patient history, and epidemiological information. The expected result is Negative.  Fact Sheet for Patients:  BloggerCourse.com  Fact Sheet for Healthcare Providers:  SeriousBroker.it  This test is no t yet approved or cleared by the [place] FDA and  has been authorized for detection and/or diagnosis of SARS-CoV-2 by FDA under an Emergency Use Authorization (EUA). This EUA will remain  in effect (meaning this test can be used) for the duration of the COVID-19 declaration under Section 564(b)(1) of the Act, 21 U.S.C.section 360bbb-3(b)(1), unless the authorization is terminated  or revoked sooner.       Influenza A by PCR NEGATIVE NEGATIVE Final   Influenza B by PCR NEGATIVE NEGATIVE Final    Comment: (NOTE) The Xpert Xpress SARS-CoV-2/FLU/RSV plus assay is intended as an aid in the diagnosis of influenza from Nasopharyngeal swab specimens and should not be used as a sole basis for treatment. Nasal washings and aspirates are unacceptable for Xpert Xpress SARS-CoV-2/FLU/RSV testing.  Fact Sheet for Patients: BloggerCourse.com  Fact Sheet for Healthcare Providers: SeriousBroker.it  This test is not yet approved or cleared by the [place] FDA and has been authorized for detection and/or diagnosis of SARS-CoV-2 by FDA under an Emergency Use Authorization (EUA). This EUA will remain in effect (meaning this test can be used) for the duration of the COVID-19 declaration under Section 564(b)(1) of the Act, 21 U.S.C. section 360bbb-3(b)(1), unless the authorization is terminated or revoked.     Resp Syncytial Virus by PCR NEGATIVE NEGATIVE Final    Comment: (NOTE) Fact Sheet for  Patients: BloggerCourse.com  Fact Sheet for Healthcare Providers: SeriousBroker.it  This test is not yet approved or cleared by the [place] FDA and has been authorized for detection and/or diagnosis of SARS-CoV-2 by FDA under an Emergency Use Authorization (EUA). This EUA will remain in effect (meaning this test can be used) for the duration of the COVID-19 declaration under Section 564(b)(1) of the Act, 21 U.S.C. section 360bbb-3(b)(1), unless the authorization is terminated or revoked.  Performed at [facility], [address] [place], [place] [address]   Blood Culture (routine x 2)     Status: None (Preliminary result)   Collection Time: 07/10/23  6:18 PM   Specimen: BLOOD  Result Value Ref Range Status   Specimen Description BLOOD BLOOD LEFT ARM  Final   Special Requests   Final    BOTTLES DRAWN AEROBIC AND ANAEROBIC Blood Culture results may not be optimal due to an excessive volume of blood received in culture bottles   Culture   Final  NO GROWTH 2 DAYS Performed at [facility], [address] [place], [place] [address]    Report Status PENDING  Incomplete  Urine Culture     Status: Abnormal   Collection Time: 07/10/23  8:22 PM   Specimen: Urine, Clean Catch  Result Value Ref Range Status   Specimen Description   Final    URINE, CLEAN CATCH Performed at [facility], [address], [place], [place] [address]    Special Requests   Final    NONE Performed at [facility], [address] [place], [place] [address]    Culture (A)  Final    <10,000 COLONIES/mL INSIGNIFICANT GROWTH Performed at [facility], [address], [place], [place] [address]    Report Status 07/12/2023 FINAL  Final    Labs: CBC: Recent Labs  Lab 07/06/23 0458 07/08/23 0526 07/10/23 1727 07/10/23 1831 07/11/23 0402  WBC 9.0 7.9 8.3  --  7.5  NEUTROABS  --   --  5.3  --   --   HGB 13.0 13.2 11.1* 11.6* 11.1*  HCT 40.9  40.9 34.4* 34.0* 35.4*  MCV 89.1 87.8 88.2  --  89.8  PLT 199 219 201  --  190   Basic Metabolic Panel: Recent Labs  Lab 07/06/23 0458 07/08/23 0526 07/10/23 1727 07/10/23 1831 07/11/23 0402  NA 136 138 133* 136 135  K 3.5 3.1* 4.2 4.8 3.8  CL 103 106 104 103 106  CO2 24 23 24   --  23  GLUCOSE 183* 197* 226* 210* 211*  BUN 18 14 23  28* 22  CREATININE 0.76 0.67 0.81 0.80 0.69  CALCIUM 9.1 9.0 8.9  --  8.9  MG 1.6* 2.2  --   --   --    Liver Function Tests: Recent Labs  Lab 07/10/23 1727  AST 14*  ALT 12  ALKPHOS 72  BILITOT 0.6  PROT 5.8*  ALBUMIN 2.7*   CBG: Recent Labs  Lab 07/11/23 0703 07/11/23 1135 07/11/23 1633 07/11/23 2138 07/12/23 0725  GLUCAP 194* 229* 186* 172* 146*    Discharge time spent: greater than 45 minutes.  Signed: Kendell Bane, MD [facility] 07/12/2023

## 2023-07-12 NOTE — TOC Progression Note (Signed)
Transition of Care [facility]) - Progression Note    Patient Details  Name: [name] MRN: [id number] Date of Birth: 1929-09-08  Transition of Care [facility]) CM/SW Contact  Leitha Bleak, RN Phone Number: 07/12/2023, 12:40 PM  Clinical Narrative:   Discussed bed offers with Daughter, she discussed with her mother at the bedside. They decided on [facility]. TOC CMA added to INS AUTH. Team updated.     Expected Discharge Plan: Skilled Nursing Facility Barriers to Discharge: Continued Medical Work up  Expected Discharge Plan and Services In-house Referral: Clinical Social Work     Living arrangements for the past 2 months: Single Family Home Expected Discharge Date: 07/12/23                   Social Determinants of Health (SDOH) Interventions SDOH Screenings   Food Insecurity: No Food Insecurity (07/11/2023)  Housing: Low Risk  (07/11/2023)  Transportation Needs: No Transportation Needs (07/11/2023)  Utilities: Not At Risk (07/11/2023)  Physical Activity: Inactive (02/08/2019)  Social Connections: Unknown (02/08/2019)  Stress: No Stress Concern Present (02/08/2019)  Tobacco Use: Low Risk  (07/10/2023)    Readmission Risk Interventions    07/11/2023    3:31 PM 07/06/2023    3:49 PM 02/12/2023   11:26 AM  Readmission Risk Prevention Plan  Transportation Screening Complete Complete Complete  PCP or Specialist Appt within 3-5 Days  Complete   HRI or Home Care Consult  Complete Complete  Social Work Consult for Recovery Care Planning/Counseling  Complete Complete  Palliative Care Screening  Not Applicable Not Applicable  Medication Review Oceanographer) Complete Complete Complete  PCP or Specialist appointment within 3-5 days of discharge Complete    HRI or Home Care Consult Complete    SW Recovery Care/Counseling Consult Complete    Palliative Care Screening Not Complete    Skilled Nursing Facility Complete

## 2023-07-13 LAB — CULTURE, BLOOD (ROUTINE X 2)

## 2023-07-15 DIAGNOSIS — G9341 Metabolic encephalopathy: Secondary | ICD-10-CM | POA: Diagnosis not present

## 2023-07-15 DIAGNOSIS — N390 Urinary tract infection, site not specified: Secondary | ICD-10-CM | POA: Diagnosis not present

## 2023-07-15 DIAGNOSIS — R5381 Other malaise: Secondary | ICD-10-CM | POA: Diagnosis not present

## 2023-07-15 LAB — CULTURE, BLOOD (ROUTINE X 2)

## 2023-07-16 DIAGNOSIS — E119 Type 2 diabetes mellitus without complications: Secondary | ICD-10-CM | POA: Diagnosis not present

## 2023-07-16 DIAGNOSIS — I2510 Atherosclerotic heart disease of native coronary artery without angina pectoris: Secondary | ICD-10-CM | POA: Diagnosis not present

## 2023-07-16 DIAGNOSIS — I10 Essential (primary) hypertension: Secondary | ICD-10-CM | POA: Diagnosis not present

## 2023-07-16 DIAGNOSIS — E1140 Type 2 diabetes mellitus with diabetic neuropathy, unspecified: Secondary | ICD-10-CM | POA: Diagnosis not present

## 2023-07-16 DIAGNOSIS — N390 Urinary tract infection, site not specified: Secondary | ICD-10-CM | POA: Diagnosis not present

## 2023-07-16 DIAGNOSIS — Z8673 Personal history of transient ischemic attack (TIA), and cerebral infarction without residual deficits: Secondary | ICD-10-CM | POA: Diagnosis not present

## 2023-07-22 DIAGNOSIS — Z8673 Personal history of transient ischemic attack (TIA), and cerebral infarction without residual deficits: Secondary | ICD-10-CM | POA: Diagnosis not present

## 2023-07-22 DIAGNOSIS — I2510 Atherosclerotic heart disease of native coronary artery without angina pectoris: Secondary | ICD-10-CM | POA: Diagnosis not present

## 2023-07-22 DIAGNOSIS — E1140 Type 2 diabetes mellitus with diabetic neuropathy, unspecified: Secondary | ICD-10-CM | POA: Diagnosis not present

## 2023-07-22 DIAGNOSIS — E119 Type 2 diabetes mellitus without complications: Secondary | ICD-10-CM | POA: Diagnosis not present

## 2023-07-22 DIAGNOSIS — I10 Essential (primary) hypertension: Secondary | ICD-10-CM | POA: Diagnosis not present

## 2023-07-22 DIAGNOSIS — R5381 Other malaise: Secondary | ICD-10-CM | POA: Diagnosis not present

## 2023-07-22 DIAGNOSIS — G9341 Metabolic encephalopathy: Secondary | ICD-10-CM | POA: Diagnosis not present

## 2023-07-26 DIAGNOSIS — R54 Age-related physical debility: Secondary | ICD-10-CM | POA: Diagnosis not present

## 2023-07-26 DIAGNOSIS — R296 Repeated falls: Secondary | ICD-10-CM | POA: Diagnosis not present

## 2023-07-26 DIAGNOSIS — Z7401 Bed confinement status: Secondary | ICD-10-CM | POA: Diagnosis not present

## 2023-07-29 DIAGNOSIS — M6281 Muscle weakness (generalized): Secondary | ICD-10-CM | POA: Diagnosis not present

## 2023-07-29 DIAGNOSIS — I2510 Atherosclerotic heart disease of native coronary artery without angina pectoris: Secondary | ICD-10-CM | POA: Diagnosis not present

## 2023-07-29 DIAGNOSIS — E1165 Type 2 diabetes mellitus with hyperglycemia: Secondary | ICD-10-CM | POA: Diagnosis not present

## 2023-07-29 DIAGNOSIS — E785 Hyperlipidemia, unspecified: Secondary | ICD-10-CM | POA: Diagnosis not present

## 2023-07-30 DIAGNOSIS — Z7982 Long term (current) use of aspirin: Secondary | ICD-10-CM | POA: Diagnosis not present

## 2023-07-30 DIAGNOSIS — Z556 Problems related to health literacy: Secondary | ICD-10-CM | POA: Diagnosis not present

## 2023-07-30 DIAGNOSIS — E1142 Type 2 diabetes mellitus with diabetic polyneuropathy: Secondary | ICD-10-CM | POA: Diagnosis not present

## 2023-07-30 DIAGNOSIS — I10 Essential (primary) hypertension: Secondary | ICD-10-CM | POA: Diagnosis not present

## 2023-07-30 DIAGNOSIS — Z993 Dependence on wheelchair: Secondary | ICD-10-CM | POA: Diagnosis not present

## 2023-07-30 DIAGNOSIS — Z8673 Personal history of transient ischemic attack (TIA), and cerebral infarction without residual deficits: Secondary | ICD-10-CM | POA: Diagnosis not present

## 2023-07-30 DIAGNOSIS — Z7984 Long term (current) use of oral hypoglycemic drugs: Secondary | ICD-10-CM | POA: Diagnosis not present

## 2023-07-30 DIAGNOSIS — I2510 Atherosclerotic heart disease of native coronary artery without angina pectoris: Secondary | ICD-10-CM | POA: Diagnosis not present

## 2023-07-30 DIAGNOSIS — M109 Gout, unspecified: Secondary | ICD-10-CM | POA: Diagnosis not present

## 2023-07-30 DIAGNOSIS — Z87440 Personal history of urinary (tract) infections: Secondary | ICD-10-CM | POA: Diagnosis not present

## 2023-07-30 DIAGNOSIS — Z7902 Long term (current) use of antithrombotics/antiplatelets: Secondary | ICD-10-CM | POA: Diagnosis not present

## 2023-07-31 DIAGNOSIS — R5381 Other malaise: Secondary | ICD-10-CM | POA: Diagnosis not present

## 2023-07-31 DIAGNOSIS — E119 Type 2 diabetes mellitus without complications: Secondary | ICD-10-CM | POA: Diagnosis not present

## 2023-07-31 DIAGNOSIS — I10 Essential (primary) hypertension: Secondary | ICD-10-CM | POA: Diagnosis not present

## 2023-07-31 DIAGNOSIS — E1140 Type 2 diabetes mellitus with diabetic neuropathy, unspecified: Secondary | ICD-10-CM | POA: Diagnosis not present

## 2023-07-31 DIAGNOSIS — I2510 Atherosclerotic heart disease of native coronary artery without angina pectoris: Secondary | ICD-10-CM | POA: Diagnosis not present

## 2023-07-31 DIAGNOSIS — G9341 Metabolic encephalopathy: Secondary | ICD-10-CM | POA: Diagnosis not present

## 2023-07-31 DIAGNOSIS — Z8673 Personal history of transient ischemic attack (TIA), and cerebral infarction without residual deficits: Secondary | ICD-10-CM | POA: Diagnosis not present

## 2023-08-02 DIAGNOSIS — Z7984 Long term (current) use of oral hypoglycemic drugs: Secondary | ICD-10-CM | POA: Diagnosis not present

## 2023-08-02 DIAGNOSIS — Z7982 Long term (current) use of aspirin: Secondary | ICD-10-CM | POA: Diagnosis not present

## 2023-08-02 DIAGNOSIS — Z556 Problems related to health literacy: Secondary | ICD-10-CM | POA: Diagnosis not present

## 2023-08-02 DIAGNOSIS — E1142 Type 2 diabetes mellitus with diabetic polyneuropathy: Secondary | ICD-10-CM | POA: Diagnosis not present

## 2023-08-02 DIAGNOSIS — Z7902 Long term (current) use of antithrombotics/antiplatelets: Secondary | ICD-10-CM | POA: Diagnosis not present

## 2023-08-02 DIAGNOSIS — Z993 Dependence on wheelchair: Secondary | ICD-10-CM | POA: Diagnosis not present

## 2023-08-02 DIAGNOSIS — I10 Essential (primary) hypertension: Secondary | ICD-10-CM | POA: Diagnosis not present

## 2023-08-02 DIAGNOSIS — I2510 Atherosclerotic heart disease of native coronary artery without angina pectoris: Secondary | ICD-10-CM | POA: Diagnosis not present

## 2023-08-02 DIAGNOSIS — M109 Gout, unspecified: Secondary | ICD-10-CM | POA: Diagnosis not present

## 2023-08-02 DIAGNOSIS — Z87440 Personal history of urinary (tract) infections: Secondary | ICD-10-CM | POA: Diagnosis not present

## 2023-08-02 DIAGNOSIS — Z8673 Personal history of transient ischemic attack (TIA), and cerebral infarction without residual deficits: Secondary | ICD-10-CM | POA: Diagnosis not present

## 2023-08-05 DIAGNOSIS — Z993 Dependence on wheelchair: Secondary | ICD-10-CM | POA: Diagnosis not present

## 2023-08-05 DIAGNOSIS — Z7982 Long term (current) use of aspirin: Secondary | ICD-10-CM | POA: Diagnosis not present

## 2023-08-05 DIAGNOSIS — Z8673 Personal history of transient ischemic attack (TIA), and cerebral infarction without residual deficits: Secondary | ICD-10-CM | POA: Diagnosis not present

## 2023-08-05 DIAGNOSIS — I2510 Atherosclerotic heart disease of native coronary artery without angina pectoris: Secondary | ICD-10-CM | POA: Diagnosis not present

## 2023-08-05 DIAGNOSIS — Z87440 Personal history of urinary (tract) infections: Secondary | ICD-10-CM | POA: Diagnosis not present

## 2023-08-05 DIAGNOSIS — E1142 Type 2 diabetes mellitus with diabetic polyneuropathy: Secondary | ICD-10-CM | POA: Diagnosis not present

## 2023-08-05 DIAGNOSIS — Z7902 Long term (current) use of antithrombotics/antiplatelets: Secondary | ICD-10-CM | POA: Diagnosis not present

## 2023-08-05 DIAGNOSIS — Z7984 Long term (current) use of oral hypoglycemic drugs: Secondary | ICD-10-CM | POA: Diagnosis not present

## 2023-08-05 DIAGNOSIS — I10 Essential (primary) hypertension: Secondary | ICD-10-CM | POA: Diagnosis not present

## 2023-08-05 DIAGNOSIS — Z556 Problems related to health literacy: Secondary | ICD-10-CM | POA: Diagnosis not present

## 2023-08-05 DIAGNOSIS — M109 Gout, unspecified: Secondary | ICD-10-CM | POA: Diagnosis not present

## 2023-08-06 DIAGNOSIS — Z556 Problems related to health literacy: Secondary | ICD-10-CM | POA: Diagnosis not present

## 2023-08-06 DIAGNOSIS — E1142 Type 2 diabetes mellitus with diabetic polyneuropathy: Secondary | ICD-10-CM | POA: Diagnosis not present

## 2023-08-06 DIAGNOSIS — Z7902 Long term (current) use of antithrombotics/antiplatelets: Secondary | ICD-10-CM | POA: Diagnosis not present

## 2023-08-06 DIAGNOSIS — Z7984 Long term (current) use of oral hypoglycemic drugs: Secondary | ICD-10-CM | POA: Diagnosis not present

## 2023-08-06 DIAGNOSIS — I2510 Atherosclerotic heart disease of native coronary artery without angina pectoris: Secondary | ICD-10-CM | POA: Diagnosis not present

## 2023-08-06 DIAGNOSIS — Z993 Dependence on wheelchair: Secondary | ICD-10-CM | POA: Diagnosis not present

## 2023-08-06 DIAGNOSIS — Z87440 Personal history of urinary (tract) infections: Secondary | ICD-10-CM | POA: Diagnosis not present

## 2023-08-06 DIAGNOSIS — I10 Essential (primary) hypertension: Secondary | ICD-10-CM | POA: Diagnosis not present

## 2023-08-06 DIAGNOSIS — M109 Gout, unspecified: Secondary | ICD-10-CM | POA: Diagnosis not present

## 2023-08-06 DIAGNOSIS — Z8673 Personal history of transient ischemic attack (TIA), and cerebral infarction without residual deficits: Secondary | ICD-10-CM | POA: Diagnosis not present

## 2023-08-06 DIAGNOSIS — Z7982 Long term (current) use of aspirin: Secondary | ICD-10-CM | POA: Diagnosis not present

## 2023-08-13 DIAGNOSIS — Z87440 Personal history of urinary (tract) infections: Secondary | ICD-10-CM | POA: Diagnosis not present

## 2023-08-13 DIAGNOSIS — Z7982 Long term (current) use of aspirin: Secondary | ICD-10-CM | POA: Diagnosis not present

## 2023-08-13 DIAGNOSIS — Z7984 Long term (current) use of oral hypoglycemic drugs: Secondary | ICD-10-CM | POA: Diagnosis not present

## 2023-08-13 DIAGNOSIS — Z8673 Personal history of transient ischemic attack (TIA), and cerebral infarction without residual deficits: Secondary | ICD-10-CM | POA: Diagnosis not present

## 2023-08-13 DIAGNOSIS — I10 Essential (primary) hypertension: Secondary | ICD-10-CM | POA: Diagnosis not present

## 2023-08-13 DIAGNOSIS — I2510 Atherosclerotic heart disease of native coronary artery without angina pectoris: Secondary | ICD-10-CM | POA: Diagnosis not present

## 2023-08-13 DIAGNOSIS — Z556 Problems related to health literacy: Secondary | ICD-10-CM | POA: Diagnosis not present

## 2023-08-13 DIAGNOSIS — Z7902 Long term (current) use of antithrombotics/antiplatelets: Secondary | ICD-10-CM | POA: Diagnosis not present

## 2023-08-13 DIAGNOSIS — Z993 Dependence on wheelchair: Secondary | ICD-10-CM | POA: Diagnosis not present

## 2023-08-13 DIAGNOSIS — E1142 Type 2 diabetes mellitus with diabetic polyneuropathy: Secondary | ICD-10-CM | POA: Diagnosis not present

## 2023-08-13 DIAGNOSIS — M109 Gout, unspecified: Secondary | ICD-10-CM | POA: Diagnosis not present

## 2023-08-15 DIAGNOSIS — Z7982 Long term (current) use of aspirin: Secondary | ICD-10-CM | POA: Diagnosis not present

## 2023-08-15 DIAGNOSIS — Z993 Dependence on wheelchair: Secondary | ICD-10-CM | POA: Diagnosis not present

## 2023-08-15 DIAGNOSIS — Z8673 Personal history of transient ischemic attack (TIA), and cerebral infarction without residual deficits: Secondary | ICD-10-CM | POA: Diagnosis not present

## 2023-08-15 DIAGNOSIS — Z7984 Long term (current) use of oral hypoglycemic drugs: Secondary | ICD-10-CM | POA: Diagnosis not present

## 2023-08-15 DIAGNOSIS — Z87440 Personal history of urinary (tract) infections: Secondary | ICD-10-CM | POA: Diagnosis not present

## 2023-08-15 DIAGNOSIS — Z7902 Long term (current) use of antithrombotics/antiplatelets: Secondary | ICD-10-CM | POA: Diagnosis not present

## 2023-08-15 DIAGNOSIS — E1142 Type 2 diabetes mellitus with diabetic polyneuropathy: Secondary | ICD-10-CM | POA: Diagnosis not present

## 2023-08-15 DIAGNOSIS — I10 Essential (primary) hypertension: Secondary | ICD-10-CM | POA: Diagnosis not present

## 2023-08-15 DIAGNOSIS — M109 Gout, unspecified: Secondary | ICD-10-CM | POA: Diagnosis not present

## 2023-08-15 DIAGNOSIS — Z556 Problems related to health literacy: Secondary | ICD-10-CM | POA: Diagnosis not present

## 2023-08-15 DIAGNOSIS — I2510 Atherosclerotic heart disease of native coronary artery without angina pectoris: Secondary | ICD-10-CM | POA: Diagnosis not present

## 2023-08-21 DIAGNOSIS — E782 Mixed hyperlipidemia: Secondary | ICD-10-CM | POA: Diagnosis not present

## 2023-08-21 DIAGNOSIS — R262 Difficulty in walking, not elsewhere classified: Secondary | ICD-10-CM | POA: Diagnosis not present

## 2023-08-21 DIAGNOSIS — I5032 Chronic diastolic (congestive) heart failure: Secondary | ICD-10-CM | POA: Diagnosis not present

## 2023-08-21 DIAGNOSIS — E1022 Type 1 diabetes mellitus with diabetic chronic kidney disease: Secondary | ICD-10-CM | POA: Diagnosis not present

## 2023-08-21 DIAGNOSIS — I2510 Atherosclerotic heart disease of native coronary artery without angina pectoris: Secondary | ICD-10-CM | POA: Diagnosis not present

## 2023-08-21 DIAGNOSIS — I10 Essential (primary) hypertension: Secondary | ICD-10-CM | POA: Diagnosis not present

## 2023-08-26 DIAGNOSIS — R296 Repeated falls: Secondary | ICD-10-CM | POA: Diagnosis not present

## 2023-08-26 DIAGNOSIS — R54 Age-related physical debility: Secondary | ICD-10-CM | POA: Diagnosis not present

## 2023-08-26 DIAGNOSIS — Z7401 Bed confinement status: Secondary | ICD-10-CM | POA: Diagnosis not present

## 2023-08-28 ENCOUNTER — Ambulatory Visit: Payer: Medicare Other

## 2023-08-28 ENCOUNTER — Ambulatory Visit
Admission: RE | Admit: 2023-08-28 | Discharge: 2023-08-28 | Disposition: A | Discharge status: Home / Self Care | Payer: Medicare Other | Source: Ambulatory Visit | Attending: Nurse Practitioner | Admitting: Nurse Practitioner

## 2023-08-28 ENCOUNTER — Other Ambulatory Visit: Payer: Self-pay

## 2023-08-28 VITALS — BP 148/83 | HR 59 | Temp 97.6°F | Resp 20

## 2023-08-28 DIAGNOSIS — M7989 Other specified soft tissue disorders: Secondary | ICD-10-CM

## 2023-08-28 DIAGNOSIS — M79642 Pain in left hand: Secondary | ICD-10-CM

## 2023-08-28 MED ORDER — ACETAMINOPHEN ER 650 MG PO TBCR: 650.0000 mg | EXTENDED_RELEASE_TABLET | Freq: Three times a day (TID) | ORAL | 0 refills | Status: DC | PRN

## 2023-08-28 NOTE — Discharge Instructions (Addendum)
Take medication as prescribed. May apply ice to the left hand to help with pain and swelling.  Apply for 20 minutes, remove for 1 hour, repeat as needed. Gentle range of motion exercises to help with hand pain and swelling. Perform the exercises at least 2 to 3 times daily. As discussed, if your symptoms do not improve with this treatment, it is recommended that you follow-up with orthopedics for further evaluation.  You can follow-up with [facility] [place], you will need to call and make an appointment.

## 2023-08-28 NOTE — ED Provider Notes (Signed)
[facility]    CSN: [id number] Arrival date & time: 08/28/23  1330      History   Chief Complaint Chief Complaint  Patient presents with   Hand Problem    Hand swollen - Entered by patient    HPI [name] is a 89 y.o. female.   The history is provided by the patient. History limited by: Patient is hard of hearing.   Patient presents for complaints of left hand pain and swelling.  Patient states when she woke up this morning, symptoms are present.  She denies injury or trauma of the left hand, numbness, tingling, or radiation of pain.  Patient states "my hand hurts all over".  Reports she has been taking Tylenol for her symptoms.   Past Medical History:  Diagnosis Date   CAD (coronary artery disease)    a. 12/2022 Inf STEMI/PCI: LM nl, LAD mild diff dzs, D1 50, D2 50., LCX 75d, OM1 60, RCA 90p (2.5x20 Synergy XD DES), 100d (PTCA-->90 residual).   Diabetes mellitus with neuropathy    Diabetic neuropathy (HCC)    Diastolic dysfunction    a. 12/2022 Echo: EF 60-65%, no rwma, GrI DD, nl RV size/fxn, triv MR.   Hypertension    Stroke [facility])    Urinary incontinence     Patient Active Problem List   Diagnosis Date Noted   Altered mental status 07/11/2023   Acute lower UTI 07/10/2023   Dyslipidemia 07/10/2023   Type 2 diabetes mellitus with peripheral neuropathy (HCC) 07/10/2023   Hypoalbuminemia due to protein-calorie malnutrition (HCC) 07/05/2023   Impaction of colon (HCC) 06/28/2023   DNR (do not resuscitate) 06/28/2023   Dehydration 05/05/2023   Hypoglycemia 02/10/2023   Chest pain 02/10/2023   Depression 02/10/2023   CAD (coronary artery disease) of artery bypass graft 01/04/2023   Pressure injury of skin 01/04/2023   Physical debility 01/04/2023   Leukocytosis 01/04/2023   Acute ST elevation myocardial infarction (STEMI) of inferior wall (HCC) 12/31/2022   SARS-CoV-2 positive 08/24/2022   SARS-CoV-2 antibody positive 08/23/2022   Emphysema of  lung (HCC) 08/21/2022   Vascular dementia without behavioral disturbance (HCC) 08/21/2022   Decubitus ulcer of buttock, stage 2 (HCC) 08/13/2022   Hypomagnesemia 08/03/2022   Hypoglycemia associated with diabetes (HCC) 08/02/2022   Hypokalemia 08/02/2022   Uncontrolled type 2 diabetes mellitus with hyperglycemia, without long-term current use of insulin (HCC) 07/19/2022   Diabetic neuropathy associated with type 2 diabetes mellitus (HCC) 07/16/2022   Hypertension associated with type 2 diabetes mellitus (HCC) 07/16/2022   Hyperlipidemia associated with type 2 diabetes mellitus (HCC) 07/16/2022   Chronic insomnia 07/16/2022   Bilateral lower extremity edema 07/16/2022   Chronic constipation 07/16/2022   Aortic atherosclerosis (HCC) 07/16/2022   Major neurocognitive disorder (HCC) 07/16/2022   DJD (degenerative joint disease) of knee 07/14/2022   UTI (urinary tract infection) 07/09/2022   Late effects of cerebrovascular disease 09/25/2021   Acute metabolic encephalopathy 07/14/2019   Cellulitis of buttock 07/14/2019   Hyperlipidemia 02/20/2016   Essential hypertension 10/10/2015   Controlled type 2 diabetes with neuropathy (HCC) 10/01/2008    Past Surgical History:  Procedure Laterality Date   ABDOMINAL HYSTERECTOMY     APPENDECTOMY     BREAST SURGERY     begnin tumor removed   CATARACT EXTRACTION, BILATERAL     CORONARY ANGIOGRAPHY N/A 12/31/2022   Procedure: CORONARY ANGIOGRAPHY;  Surgeon: Yvonne Kendall, MD;  Location: [facility] IN[facility]IVE [facility];  Service: Cardiovascular;  Laterality: N/A;  CORONARY/GRAFT ACUTE MI REVASCULARIZATION N/A 12/31/2022   Procedure: Coronary/Graft Acute MI Revascularization;  Surgeon: Yvonne Kendall, MD;  Location: [facility] INVASIVE CV LAB;  Service: Cardiovascular;  Laterality: N/A;    OB History   No obstetric history on file.      Home Medications    Prior to Admission medications   Medication Sig Start Date End Date Taking? Authorizing Provider   acetaminophen (TYLENOL) 650 MG CR tablet Take 1 tablet (650 mg total) by mouth every 8 (eight) hours as needed for pain. 08/28/23  Yes Cali Hope-Warren, Sadie Haber, NP  aspirin EC 81 MG tablet Take 1 tablet (81 mg total) by mouth daily. Swallow whole. 01/05/23   Dunn, Tacey Ruiz, PA-C  Cholecalciferol (VITAMIN D3) 5000 units CAPS Take 1 capsule (5,000 Units total) by mouth daily. 04/15/17   Roma Kayser, MD  gabapentin (NEURONTIN) 300 MG capsule Take 1 capsule (300 mg total) by mouth 2 (two) times daily. Take 300 mg three times daily Patient taking differently: Take 300-600 mg by mouth 2 (two) times daily. Take 600 mg (2 capsules) by mouth every morning and 300 mg (1 capsule) every evening. 05/05/23   Vassie Loll, MD  metFORMIN (GLUCOPHAGE) 500 MG tablet Take 500 mg by mouth 2 (two) times daily. 06/26/23   [provider]  metoprolol succinate (TOPROL-XL) 50 MG 24 hr tablet Take 50 mg by mouth at bedtime. 04/01/23   [provider]  sertraline (ZOLOFT) 25 MG tablet Take 25 mg by mouth daily. Take with 50mg  dose to equal 75mg  daily.    [provider]  sertraline (ZOLOFT) 50 MG tablet Take 50 mg by mouth daily. Take with 25mg  dose to equal 75mg  daily.    [provider]  silodosin (RAPAFLO) 8 MG CAPS capsule Take 1 capsule (8 mg total) by mouth daily with breakfast. 10/10/22   Summerlin, Regan Rakers, PA-C  simvastatin (ZOCOR) 20 MG tablet Take 20 mg by mouth every evening. 06/24/23   [provider]  ticagrelor (BRILINTA) 90 MG TABS tablet Take 1 tablet (90 mg total) by mouth 2 (two) times daily. Patient not taking: Reported on 07/11/2023 01/04/23   Laurann Montana, PA-C  Trospium Chloride 60 MG CP24 Take 1 capsule by mouth daily. 04/01/23   [provider]    Family History Family History  Problem Relation Age of Onset   CAD Mother    Hypertension Mother    Stroke Father    Heart attack Brother    Cancer Brother     Social History Social  History   Tobacco Use   Smoking status: Never   Smokeless tobacco: Never  Vaping Use   Vaping status: Never Used  Substance Use Topics   Alcohol use: No   Drug use: No     Allergies   Codeine, Glipizide, and Sulfa antibiotics   Review of Systems Review of Systems Per HPI  Physical Exam Triage Vital Signs ED Triage Vitals  Encounter Vitals Group     BP 08/28/23 1338 (!) 148/83     Systolic BP Percentile --      Diastolic BP Percentile --      Pulse Rate 08/28/23 1338 (!) 59     Resp 08/28/23 1338 20     Temp 08/28/23 1338 97.6 F (36.4 C)     Temp Source 08/28/23 1338 Oral     SpO2 08/28/23 1338 94 %     Weight --      Height --  Head Circumference --      Peak Flow --      Pain Score 08/28/23 1339 8     Pain Loc --      Pain Education --      Exclude from Growth Chart --    No data found.  Updated Vital Signs BP (!) 148/83 (BP Location: Right Arm)   Pulse (!) 59   Temp 97.6 F (36.4 C) (Oral)   Resp 20   SpO2 94%   Visual Acuity Right Eye Distance:   Left Eye Distance:   Bilateral Distance:    Right Eye Near:   Left Eye Near:    Bilateral Near:     Physical Exam Vitals and nursing note reviewed.  Constitutional:      General: She is not in acute distress.    Appearance: Normal appearance.  HENT:     Head: Normocephalic.  Eyes:     Extraocular Movements: Extraocular movements intact.     Pupils: Pupils are equal, round, and reactive to light.  Pulmonary:     Effort: Pulmonary effort is normal.  Musculoskeletal:     Left hand: Swelling present. No deformity. Decreased range of motion. Decreased strength. Normal sensation. Normal capillary refill. Normal pulse.     Cervical back: Normal range of motion.  Skin:    General: Skin is warm and dry.  Neurological:     General: No focal deficit present.     Mental Status: She is alert and oriented to person, place, and time.  Psychiatric:        Mood and Affect: Mood normal.         Behavior: Behavior normal.      UC Treatments / Results  Labs (all labs ordered are listed, but only abnormal results are displayed) Labs Reviewed - No data to display  EKG   Radiology No results found.  Procedures Procedures (including critical care time)  Medications Ordered in UC Medications - No data to display  Initial Impression / Assessment and Plan / UC Course  I have reviewed the triage vital signs and the nursing notes.  Pertinent labs & imaging results that were available during my care of the patient were reviewed by me and considered in my medical decision making (see chart for details).  The patient is well-appearing, she is in no acute distress, vital signs are stable.  Review of the patient's chart shows an x-ray of the left hand dated 07/10/23.  X-ray was positive for degenerative changes in the left hand and wrist with ligamentous calcifications, no further imaging was performed today as patient has not developed any new symptoms.  Tylenol arthritis strength 650 mg tablets prescribed for patient's left hand pain.  Supportive care recommendations were provided and discussed with the patient to include the application of ice, and gentle range of motion exercises.  Patient was advised to follow-up with her primary care physician or with [facility] [place] if symptoms fail to improve with this treatment.  Patient was in agreement with this plan of care and verbalized understanding.  All questions were answered.  Patient stable for discharge.  Final Clinical Impressions(s) / UC Diagnoses   Final diagnoses:  Pain in left hand  Swelling of left hand     Discharge Instructions      Take medication as prescribed. May apply ice to the left hand to help with pain and swelling.  Apply for 20 minutes, remove for 1 hour, repeat as needed. Gentle  range of motion exercises to help with hand pain and swelling. Perform the exercises at least 2 to 3 times daily. As  discussed, if your symptoms do not improve with this treatment, it is recommended that you follow-up with orthopedics for further evaluation.  You can follow-up with [facility] [place], you will need to call and make an appointment.     ED Prescriptions     Medication Sig Dispense Auth. Provider   acetaminophen (TYLENOL) 650 MG CR tablet Take 1 tablet (650 mg total) by mouth every 8 (eight) hours as needed for pain. 30 tablet Dariann Huckaba-Warren, Sadie Haber, NP      PDMP not reviewed this encounter.   Abran Cantor, NP 08/28/23 2006

## 2023-08-28 NOTE — ED Triage Notes (Signed)
Pt reports woke up yesterday with right hand swelling. Denies any known injury. No redness noted.

## 2023-08-29 DIAGNOSIS — E1165 Type 2 diabetes mellitus with hyperglycemia: Secondary | ICD-10-CM | POA: Diagnosis not present

## 2023-08-29 DIAGNOSIS — M6281 Muscle weakness (generalized): Secondary | ICD-10-CM | POA: Diagnosis not present

## 2023-08-29 DIAGNOSIS — I2510 Atherosclerotic heart disease of native coronary artery without angina pectoris: Secondary | ICD-10-CM | POA: Diagnosis not present

## 2023-08-29 DIAGNOSIS — E785 Hyperlipidemia, unspecified: Secondary | ICD-10-CM | POA: Diagnosis not present

## 2023-09-16 ENCOUNTER — Ambulatory Visit: Payer: Medicare Other | Admitting: "Endocrinology

## 2023-09-25 DIAGNOSIS — R296 Repeated falls: Secondary | ICD-10-CM | POA: Diagnosis not present

## 2023-09-25 DIAGNOSIS — Z7401 Bed confinement status: Secondary | ICD-10-CM | POA: Diagnosis not present

## 2023-09-25 DIAGNOSIS — R54 Age-related physical debility: Secondary | ICD-10-CM | POA: Diagnosis not present

## 2023-09-28 DIAGNOSIS — M6281 Muscle weakness (generalized): Secondary | ICD-10-CM | POA: Diagnosis not present

## 2023-09-28 DIAGNOSIS — E1165 Type 2 diabetes mellitus with hyperglycemia: Secondary | ICD-10-CM | POA: Diagnosis not present

## 2023-09-28 DIAGNOSIS — E785 Hyperlipidemia, unspecified: Secondary | ICD-10-CM | POA: Diagnosis not present

## 2023-09-28 DIAGNOSIS — I2510 Atherosclerotic heart disease of native coronary artery without angina pectoris: Secondary | ICD-10-CM | POA: Diagnosis not present

## 2023-10-26 DIAGNOSIS — Z7401 Bed confinement status: Secondary | ICD-10-CM | POA: Diagnosis not present

## 2023-10-26 DIAGNOSIS — R54 Age-related physical debility: Secondary | ICD-10-CM | POA: Diagnosis not present

## 2023-10-26 DIAGNOSIS — R296 Repeated falls: Secondary | ICD-10-CM | POA: Diagnosis not present

## 2023-10-29 DIAGNOSIS — M6281 Muscle weakness (generalized): Secondary | ICD-10-CM | POA: Diagnosis not present

## 2023-10-29 DIAGNOSIS — E785 Hyperlipidemia, unspecified: Secondary | ICD-10-CM | POA: Diagnosis not present

## 2023-10-29 DIAGNOSIS — I2510 Atherosclerotic heart disease of native coronary artery without angina pectoris: Secondary | ICD-10-CM | POA: Diagnosis not present

## 2023-10-29 DIAGNOSIS — E1165 Type 2 diabetes mellitus with hyperglycemia: Secondary | ICD-10-CM | POA: Diagnosis not present

## 2023-11-25 DIAGNOSIS — Z7401 Bed confinement status: Secondary | ICD-10-CM | POA: Diagnosis not present

## 2023-11-25 DIAGNOSIS — R54 Age-related physical debility: Secondary | ICD-10-CM | POA: Diagnosis not present

## 2023-11-25 DIAGNOSIS — R296 Repeated falls: Secondary | ICD-10-CM | POA: Diagnosis not present

## 2023-11-28 DIAGNOSIS — E785 Hyperlipidemia, unspecified: Secondary | ICD-10-CM | POA: Diagnosis not present

## 2023-11-28 DIAGNOSIS — M6281 Muscle weakness (generalized): Secondary | ICD-10-CM | POA: Diagnosis not present

## 2023-11-28 DIAGNOSIS — E1165 Type 2 diabetes mellitus with hyperglycemia: Secondary | ICD-10-CM | POA: Diagnosis not present

## 2023-11-28 DIAGNOSIS — I2510 Atherosclerotic heart disease of native coronary artery without angina pectoris: Secondary | ICD-10-CM | POA: Diagnosis not present

## 2023-12-26 DIAGNOSIS — R296 Repeated falls: Secondary | ICD-10-CM | POA: Diagnosis not present

## 2023-12-26 DIAGNOSIS — Z7401 Bed confinement status: Secondary | ICD-10-CM | POA: Diagnosis not present

## 2023-12-26 DIAGNOSIS — R54 Age-related physical debility: Secondary | ICD-10-CM | POA: Diagnosis not present

## 2023-12-29 DIAGNOSIS — I2510 Atherosclerotic heart disease of native coronary artery without angina pectoris: Secondary | ICD-10-CM | POA: Diagnosis not present

## 2023-12-29 DIAGNOSIS — E1165 Type 2 diabetes mellitus with hyperglycemia: Secondary | ICD-10-CM | POA: Diagnosis not present

## 2023-12-29 DIAGNOSIS — E785 Hyperlipidemia, unspecified: Secondary | ICD-10-CM | POA: Diagnosis not present

## 2023-12-29 DIAGNOSIS — M6281 Muscle weakness (generalized): Secondary | ICD-10-CM | POA: Diagnosis not present

## 2024-01-26 DIAGNOSIS — R54 Age-related physical debility: Secondary | ICD-10-CM | POA: Diagnosis not present

## 2024-01-26 DIAGNOSIS — Z7401 Bed confinement status: Secondary | ICD-10-CM | POA: Diagnosis not present

## 2024-01-26 DIAGNOSIS — R296 Repeated falls: Secondary | ICD-10-CM | POA: Diagnosis not present

## 2024-01-29 DIAGNOSIS — E785 Hyperlipidemia, unspecified: Secondary | ICD-10-CM | POA: Diagnosis not present

## 2024-01-29 DIAGNOSIS — E1165 Type 2 diabetes mellitus with hyperglycemia: Secondary | ICD-10-CM | POA: Diagnosis not present

## 2024-01-29 DIAGNOSIS — I2510 Atherosclerotic heart disease of native coronary artery without angina pectoris: Secondary | ICD-10-CM | POA: Diagnosis not present

## 2024-01-29 DIAGNOSIS — M6281 Muscle weakness (generalized): Secondary | ICD-10-CM | POA: Diagnosis not present

## 2024-02-23 DIAGNOSIS — R296 Repeated falls: Secondary | ICD-10-CM | POA: Diagnosis not present

## 2024-02-23 DIAGNOSIS — Z7401 Bed confinement status: Secondary | ICD-10-CM | POA: Diagnosis not present

## 2024-02-23 DIAGNOSIS — R54 Age-related physical debility: Secondary | ICD-10-CM | POA: Diagnosis not present

## 2024-02-26 DIAGNOSIS — E1165 Type 2 diabetes mellitus with hyperglycemia: Secondary | ICD-10-CM | POA: Diagnosis not present

## 2024-02-26 DIAGNOSIS — I2510 Atherosclerotic heart disease of native coronary artery without angina pectoris: Secondary | ICD-10-CM | POA: Diagnosis not present

## 2024-02-26 DIAGNOSIS — E785 Hyperlipidemia, unspecified: Secondary | ICD-10-CM | POA: Diagnosis not present

## 2024-02-26 DIAGNOSIS — M6281 Muscle weakness (generalized): Secondary | ICD-10-CM | POA: Diagnosis not present

## 2024-03-06 DIAGNOSIS — E782 Mixed hyperlipidemia: Secondary | ICD-10-CM | POA: Diagnosis not present

## 2024-03-06 DIAGNOSIS — I2510 Atherosclerotic heart disease of native coronary artery without angina pectoris: Secondary | ICD-10-CM | POA: Diagnosis not present

## 2024-03-06 DIAGNOSIS — I5032 Chronic diastolic (congestive) heart failure: Secondary | ICD-10-CM | POA: Diagnosis not present

## 2024-03-06 DIAGNOSIS — Z7401 Bed confinement status: Secondary | ICD-10-CM | POA: Diagnosis not present

## 2024-03-06 DIAGNOSIS — E1159 Type 2 diabetes mellitus with other circulatory complications: Secondary | ICD-10-CM | POA: Diagnosis not present

## 2024-03-06 DIAGNOSIS — R262 Difficulty in walking, not elsewhere classified: Secondary | ICD-10-CM | POA: Diagnosis not present

## 2024-03-06 DIAGNOSIS — G4700 Insomnia, unspecified: Secondary | ICD-10-CM | POA: Diagnosis not present

## 2024-03-06 DIAGNOSIS — I10 Essential (primary) hypertension: Secondary | ICD-10-CM | POA: Diagnosis not present

## 2024-03-25 DIAGNOSIS — Z7401 Bed confinement status: Secondary | ICD-10-CM | POA: Diagnosis not present

## 2024-03-25 DIAGNOSIS — J9620 Acute and chronic respiratory failure, unspecified whether with hypoxia or hypercapnia: Secondary | ICD-10-CM | POA: Diagnosis not present

## 2024-03-25 DIAGNOSIS — R296 Repeated falls: Secondary | ICD-10-CM | POA: Diagnosis not present

## 2024-03-25 DIAGNOSIS — R54 Age-related physical debility: Secondary | ICD-10-CM | POA: Diagnosis not present

## 2024-03-28 DIAGNOSIS — J9620 Acute and chronic respiratory failure, unspecified whether with hypoxia or hypercapnia: Secondary | ICD-10-CM | POA: Diagnosis not present

## 2024-03-28 DIAGNOSIS — M6281 Muscle weakness (generalized): Secondary | ICD-10-CM | POA: Diagnosis not present

## 2024-03-28 DIAGNOSIS — E1165 Type 2 diabetes mellitus with hyperglycemia: Secondary | ICD-10-CM | POA: Diagnosis not present

## 2024-03-28 DIAGNOSIS — E785 Hyperlipidemia, unspecified: Secondary | ICD-10-CM | POA: Diagnosis not present

## 2024-03-28 DIAGNOSIS — I2510 Atherosclerotic heart disease of native coronary artery without angina pectoris: Secondary | ICD-10-CM | POA: Diagnosis not present

## 2024-04-27 DIAGNOSIS — E1165 Type 2 diabetes mellitus with hyperglycemia: Secondary | ICD-10-CM | POA: Diagnosis not present

## 2024-04-27 DIAGNOSIS — M6281 Muscle weakness (generalized): Secondary | ICD-10-CM | POA: Diagnosis not present

## 2024-04-27 DIAGNOSIS — J9620 Acute and chronic respiratory failure, unspecified whether with hypoxia or hypercapnia: Secondary | ICD-10-CM | POA: Diagnosis not present

## 2024-04-27 DIAGNOSIS — E785 Hyperlipidemia, unspecified: Secondary | ICD-10-CM | POA: Diagnosis not present

## 2024-04-27 DIAGNOSIS — I2510 Atherosclerotic heart disease of native coronary artery without angina pectoris: Secondary | ICD-10-CM | POA: Diagnosis not present

## 2024-07-06 DIAGNOSIS — G4700 Insomnia, unspecified: Secondary | ICD-10-CM | POA: Diagnosis not present

## 2024-07-06 DIAGNOSIS — E1140 Type 2 diabetes mellitus with diabetic neuropathy, unspecified: Secondary | ICD-10-CM | POA: Diagnosis not present

## 2024-08-14 DIAGNOSIS — I10 Essential (primary) hypertension: Secondary | ICD-10-CM | POA: Diagnosis not present

## 2024-08-14 DIAGNOSIS — I2510 Atherosclerotic heart disease of native coronary artery without angina pectoris: Secondary | ICD-10-CM | POA: Diagnosis not present

## 2024-08-14 DIAGNOSIS — I110 Hypertensive heart disease with heart failure: Secondary | ICD-10-CM | POA: Diagnosis not present

## 2024-08-14 DIAGNOSIS — E7849 Other hyperlipidemia: Secondary | ICD-10-CM | POA: Diagnosis not present

## 2024-10-23 ENCOUNTER — Emergency Department (HOSPITAL_COMMUNITY)

## 2024-10-23 ENCOUNTER — Other Ambulatory Visit: Payer: Self-pay

## 2024-10-23 ENCOUNTER — Inpatient Hospital Stay (HOSPITAL_COMMUNITY)
Admission: EM | Admit: 2024-10-23 | Discharge: 2024-10-26 | DRG: 069 | Disposition: A | Discharge status: Home / Self Care | Attending: Hospitalist | Admitting: Hospitalist

## 2024-10-23 ENCOUNTER — Encounter (HOSPITAL_COMMUNITY): Payer: Self-pay | Admitting: *Deleted

## 2024-10-23 DIAGNOSIS — R4789 Other speech disturbances: Secondary | ICD-10-CM

## 2024-10-23 DIAGNOSIS — F0390 Unspecified dementia without behavioral disturbance: Secondary | ICD-10-CM | POA: Diagnosis present

## 2024-10-23 DIAGNOSIS — Z7982 Long term (current) use of aspirin: Secondary | ICD-10-CM | POA: Diagnosis not present

## 2024-10-23 DIAGNOSIS — Z993 Dependence on wheelchair: Secondary | ICD-10-CM

## 2024-10-23 DIAGNOSIS — Z7984 Long term (current) use of oral hypoglycemic drugs: Secondary | ICD-10-CM

## 2024-10-23 DIAGNOSIS — I252 Old myocardial infarction: Secondary | ICD-10-CM

## 2024-10-23 DIAGNOSIS — R32 Unspecified urinary incontinence: Secondary | ICD-10-CM | POA: Diagnosis present

## 2024-10-23 DIAGNOSIS — I10 Essential (primary) hypertension: Secondary | ICD-10-CM | POA: Diagnosis present

## 2024-10-23 DIAGNOSIS — M6282 Rhabdomyolysis: Secondary | ICD-10-CM | POA: Diagnosis present

## 2024-10-23 DIAGNOSIS — F32A Depression, unspecified: Secondary | ICD-10-CM | POA: Diagnosis present

## 2024-10-23 DIAGNOSIS — Z8249 Family history of ischemic heart disease and other diseases of the circulatory system: Secondary | ICD-10-CM

## 2024-10-23 DIAGNOSIS — R531 Weakness: Secondary | ICD-10-CM

## 2024-10-23 DIAGNOSIS — Z888 Allergy status to other drugs, medicaments and biological substances status: Secondary | ICD-10-CM

## 2024-10-23 DIAGNOSIS — R509 Fever, unspecified: Secondary | ICD-10-CM | POA: Diagnosis not present

## 2024-10-23 DIAGNOSIS — R159 Full incontinence of feces: Secondary | ICD-10-CM | POA: Diagnosis present

## 2024-10-23 DIAGNOSIS — I2510 Atherosclerotic heart disease of native coronary artery without angina pectoris: Secondary | ICD-10-CM | POA: Diagnosis present

## 2024-10-23 DIAGNOSIS — Z955 Presence of coronary angioplasty implant and graft: Secondary | ICD-10-CM

## 2024-10-23 DIAGNOSIS — E1169 Type 2 diabetes mellitus with other specified complication: Secondary | ICD-10-CM | POA: Diagnosis present

## 2024-10-23 DIAGNOSIS — Z7902 Long term (current) use of antithrombotics/antiplatelets: Secondary | ICD-10-CM

## 2024-10-23 DIAGNOSIS — E7849 Other hyperlipidemia: Secondary | ICD-10-CM | POA: Diagnosis present

## 2024-10-23 DIAGNOSIS — G459 Transient cerebral ischemic attack, unspecified: Principal | ICD-10-CM | POA: Diagnosis present

## 2024-10-23 DIAGNOSIS — Z79899 Other long term (current) drug therapy: Secondary | ICD-10-CM

## 2024-10-23 DIAGNOSIS — G8191 Hemiplegia, unspecified affecting right dominant side: Secondary | ICD-10-CM | POA: Diagnosis not present

## 2024-10-23 DIAGNOSIS — E1142 Type 2 diabetes mellitus with diabetic polyneuropathy: Secondary | ICD-10-CM | POA: Diagnosis not present

## 2024-10-23 DIAGNOSIS — Z885 Allergy status to narcotic agent status: Secondary | ICD-10-CM

## 2024-10-23 DIAGNOSIS — E782 Mixed hyperlipidemia: Secondary | ICD-10-CM

## 2024-10-23 DIAGNOSIS — Z823 Family history of stroke: Secondary | ICD-10-CM

## 2024-10-23 DIAGNOSIS — R4701 Aphasia: Secondary | ICD-10-CM | POA: Diagnosis present

## 2024-10-23 DIAGNOSIS — G9341 Metabolic encephalopathy: Secondary | ICD-10-CM | POA: Diagnosis present

## 2024-10-23 DIAGNOSIS — R2990 Unspecified symptoms and signs involving the nervous system: Principal | ICD-10-CM

## 2024-10-23 DIAGNOSIS — E1165 Type 2 diabetes mellitus with hyperglycemia: Secondary | ICD-10-CM | POA: Diagnosis present

## 2024-10-23 DIAGNOSIS — Z882 Allergy status to sulfonamides status: Secondary | ICD-10-CM

## 2024-10-23 DIAGNOSIS — F0153 Vascular dementia, unspecified severity, with mood disturbance: Secondary | ICD-10-CM | POA: Diagnosis present

## 2024-10-23 DIAGNOSIS — Z66 Do not resuscitate: Secondary | ICD-10-CM | POA: Diagnosis present

## 2024-10-23 DIAGNOSIS — Z8616 Personal history of COVID-19: Secondary | ICD-10-CM

## 2024-10-23 DIAGNOSIS — E876 Hypokalemia: Secondary | ICD-10-CM | POA: Diagnosis present

## 2024-10-23 DIAGNOSIS — Z90710 Acquired absence of both cervix and uterus: Secondary | ICD-10-CM

## 2024-10-23 DIAGNOSIS — F0150 Vascular dementia without behavioral disturbance: Secondary | ICD-10-CM | POA: Diagnosis present

## 2024-10-23 LAB — CBC
HCT: 38.6 % (ref 36.0–46.0)
Hemoglobin: 13.3 g/dL (ref 12.0–15.0)
MCH: 30.5 pg (ref 26.0–34.0)
MCHC: 34.5 g/dL (ref 30.0–36.0)
MCV: 88.5 fL (ref 80.0–100.0)
Platelets: 243 K/uL (ref 150–400)
RBC: 4.36 MIL/uL (ref 3.87–5.11)
RDW: 13.2 % (ref 11.5–15.5)
WBC: 9.1 K/uL (ref 4.0–10.5)
nRBC: 0 % (ref 0.0–0.2)

## 2024-10-23 LAB — DIFFERENTIAL
Abs Immature Granulocytes: 0.03 K/uL (ref 0.00–0.07)
Basophils Absolute: 0 K/uL (ref 0.0–0.1)
Basophils Relative: 0 %
Eosinophils Absolute: 0.1 K/uL (ref 0.0–0.5)
Eosinophils Relative: 1 %
Immature Granulocytes: 0 %
Lymphocytes Relative: 17 %
Lymphs Abs: 1.6 K/uL (ref 0.7–4.0)
Monocytes Absolute: 0.7 K/uL (ref 0.1–1.0)
Monocytes Relative: 7 %
Neutro Abs: 6.7 K/uL (ref 1.7–7.7)
Neutrophils Relative %: 75 %

## 2024-10-23 LAB — COMPREHENSIVE METABOLIC PANEL WITH GFR
ALT: 9 U/L (ref 0–44)
AST: 18 U/L (ref 15–41)
Albumin: 4.1 g/dL (ref 3.5–5.0)
Alkaline Phosphatase: 90 U/L (ref 38–126)
Anion gap: 16 — ABNORMAL HIGH (ref 5–15)
BUN: 15 mg/dL (ref 8–23)
CO2: 22 mmol/L (ref 22–32)
Calcium: 9.8 mg/dL (ref 8.9–10.3)
Chloride: 103 mmol/L (ref 98–111)
Creatinine, Ser: 0.66 mg/dL (ref 0.44–1.00)
GFR, Estimated: >60 mL/min (ref >60)
Glucose, Bld: 202 mg/dL — ABNORMAL HIGH (ref 70–99)
Potassium: 4 mmol/L (ref 3.5–5.1)
Sodium: 141 mmol/L (ref 135–145)
Total Bilirubin: 0.7 mg/dL (ref 0.0–1.2)
Total Protein: 6.9 g/dL (ref 6.5–8.1)

## 2024-10-23 LAB — APTT: aPTT: 24 s (ref 24–36)

## 2024-10-23 LAB — URINE DRUG SCREEN
Amphetamines: NEGATIVE
Barbiturates: NEGATIVE
Benzodiazepines: NEGATIVE
Cocaine: NEGATIVE
Fentanyl: NEGATIVE
Methadone Scn, Ur: NEGATIVE
Opiates: NEGATIVE
Tetrahydrocannabinol: NEGATIVE

## 2024-10-23 LAB — ETHANOL: Alcohol, Ethyl (B): <15 mg/dL (ref <15)

## 2024-10-23 LAB — PROTIME-INR
INR: 1 (ref 0.8–1.2)
Prothrombin Time: 13.6 s (ref 11.4–15.2)

## 2024-10-23 MED ORDER — HALOPERIDOL LACTATE 5 MG/ML IJ SOLN: 1.0000 mg | Freq: Once | INTRAMUSCULAR | Status: DC

## 2024-10-23 MED ORDER — SENNOSIDES-DOCUSATE SODIUM 8.6-50 MG PO TABS: 1.0000 | ORAL_TABLET | Freq: Every evening | ORAL | Status: DC | PRN

## 2024-10-23 MED ORDER — IOHEXOL 350 MG/ML SOLN
60.0000 mL | Freq: Once | INTRAVENOUS | Status: AC | PRN
  Administered 2024-10-23: 60 mL via INTRAVENOUS

## 2024-10-23 MED ORDER — KETOROLAC TROMETHAMINE 15 MG/ML IJ SOLN
15.0000 mg | Freq: Once | INTRAMUSCULAR | Status: AC
  Administered 2024-10-24: 15 mg via INTRAVENOUS
  Filled 2024-10-23: qty 1

## 2024-10-23 MED ORDER — VANCOMYCIN HCL IN DEXTROSE 1-5 GM/200ML-% IV SOLN
1000.0000 mg | Freq: Once | INTRAVENOUS | Status: AC
  Administered 2024-10-23: 1000 mg via INTRAVENOUS
  Filled 2024-10-23: qty 200

## 2024-10-23 MED ORDER — SODIUM CHLORIDE 0.9 % IV SOLN
INTRAVENOUS | Status: AC
Start: 2024-10-23 — End: 2024-10-24

## 2024-10-23 MED ORDER — ACETAMINOPHEN 650 MG RE SUPP: 650.0000 mg | RECTAL | Status: DC | PRN

## 2024-10-23 MED ORDER — PIPERACILLIN-TAZOBACTAM 3.375 G IVPB
3.3750 g | Freq: Three times a day (TID) | INTRAVENOUS | Status: DC
  Administered 2024-10-24 – 2024-10-26 (×8): 3.375 g via INTRAVENOUS
  Filled 2024-10-23 (×8): qty 50

## 2024-10-23 MED ORDER — STROKE: EARLY STAGES OF RECOVERY BOOK: Freq: Once | Status: AC

## 2024-10-23 MED ORDER — ACETAMINOPHEN 325 MG PO TABS
650.0000 mg | ORAL_TABLET | ORAL | Status: DC | PRN
  Administered 2024-10-25: 650 mg via ORAL
  Filled 2024-10-23: qty 2

## 2024-10-23 MED ORDER — ACETAMINOPHEN 160 MG/5ML PO SOLN: 650.0000 mg | ORAL | Status: DC | PRN

## 2024-10-23 NOTE — ED Notes (Signed)
 Arrives to bridge, EDP present

## 2024-10-23 NOTE — ED Notes (Signed)
 Tremor present, BP cuff moved to L calf.

## 2024-10-23 NOTE — ED Triage Notes (Signed)
 BIB [facility] from home, lives with 2 family members (husband and daughter?), called out for NO speech, and R sided weakness. Described as new. Onset thought to be/ reported 1230. EDP notified and at bridge on arrival. Tele-neuro initiated at this time. Pt alert, NAD, restless, fidgety, airway patent. Blank stare around room bilaterally, but not tracking. Feels pain bilaterally. R side weakness present, non verbal. Not following commands. CBG 171. BP 207/122: at this time.

## 2024-10-23 NOTE — ED Notes (Signed)
Into CT scanner.

## 2024-10-23 NOTE — Consult Note (Addendum)
 [facility] Telemedicine Consult   Requesting Provider: Francesca Consult Participants: Nurse Location of the provider: [facility] Location of the patient: [facility]  This consult was provided via telemedicine with 2-way video and audio communication. The patient/family was informed that care would be provided in this way and agreed to receive care in this manner. Spoke with daughter on the phone   Chief Complaint: No speech, right sided weakness  HPI: 89 year old female with a history of stroke, HTN, DM, CAD, dementia who was seen by her caregiver this morning at 1000.  She was able to eat, etc.  Caregiver left her to do some things around the house and when she returned at 1230 patient was not speaking and seemed to be using the right less.  Son was at the house later in the day and noted patient was not communicative as well.  Daughter saw patient later and EMS was called.   At baseline patient with dementia, incontinent, requires assistance with ADL's and is wheelchair bound.      LKW: 1030 on 10/23/2024 tpa given?: No, Outside time window IR Thrombectomy? No, Low baseline functional ability, no target lesion identified   Modified Rankin Scale: 4-Needs assistance to walk and tend to bodily needs Time of teleneurologist evaluation: 1803  Exam: BP 207/122  General: NAD  1A: Level of Consciousness - 0 1B: Ask Month and Age - 2 1C: 'Blink Eyes' & 'Squeeze Hands' - 2 2: Test Horizontal Extraocular Movements - 0 3: Test Visual Fields - 0 4: Test Facial Palsy - 0 5A: Test Left Arm Motor Drift - 0 5B: Test Right Arm Motor Drift - 2 6A: Test Left Leg Motor Drift - 3 6B: Test Right Leg Motor Drift - 3 7: Test Limb Ataxia - 0 8: Test Sensation - 0 9: Test Language/Aphasia- 3 10: Test Dysarthria - 2 11: Test Extinction/Inattention - 0 NIHSS score: 17   Imaging Reviewed:  CT HEAD WITHOUT 10/23/2024 06:14:00 PM   TECHNIQUE: CT of the head was performed without the  administration of intravenous contrast. Automated exposure control, iterative reconstruction, and/or weight based adjustment of the mA/kV was utilized to reduce the radiation dose to as low as reasonably achievable.   COMPARISON: None available.   CLINICAL HISTORY: Neuro deficit, acute, stroke suspected.   FINDINGS:   BRAIN AND VENTRICLES: No acute intracranial hemorrhage. No mass effect or midline shift. No extra-axial fluid collection. No evidence of acute infarct. No hydrocephalus. Generalized parenchymal volume loss and chronic small vessel ischemic changes.   [place] Stroke Program Early CT Score (ASPECTS)   Ganglionic (caudate, IC, lentiform nucleus, insula, M1-M3): 7   Supraganglionic (M4-M6): 3   Total: 10   ORBITS: No acute abnormality.   SINUSES AND MASTOIDS: No acute abnormality.   SOFT TISSUES AND SKULL: No acute skull fracture. No acute soft tissue abnormality.   IMPRESSION: 1. No acute intracranial abnormality. 2. Generalized parenchymal volume loss and chronic small vessel ischemic changes. 3. ASPECTS: 10.    Labs reviewed in epic and pertinent values follow: CBG 171   Assessment: 89 year old female with a history of stroke, HTN, DM, CAD, dementia presenting with right sided weakness and inability to speak.  Acute infarct likely.  LKW about 1030 making patient outside thrombolytic time window.  Head CT personally reviewed and shows no acute changes.  CTA of the head and neck personally reviewed and shows no evidence of LVO therefore patient not a thrombectomy candidate.   Patient on ASA  prior to presentation.   Recommendations:  1. HgbA1c, fasting lipid panel 2. MRI of the brain without contrast 3. PT consult, OT consult, Speech consult 4. Echocardiogram 5. Permissive BP management with no treatment of SBP<200 6. Prophylactic therapy-ASA 325mg  now with Plavix 300mg  if able to take po otherwise ASA 300mg  rectally 7. NPO until RN stroke swallow  screen 8. Telemetry monitoring 9. Frequent neuro checks     This patient is receiving care for possible acute neurological changes. Care by this provider at the time of service included time for direct evaluation via telemedicine, review of medical records, imaging studies and discussion of findings with providers, the patient and/or family.  Sonny Hock, MD Neurology   If 7pm- 7am, please page neurology on call as listed in [facility].

## 2024-10-23 NOTE — ED Notes (Signed)
 MD Francesca endorsed he wanted the pt to not have the haldol  at this time.

## 2024-10-23 NOTE — ED Notes (Signed)
 To CT

## 2024-10-23 NOTE — ED Notes (Signed)
 Dr. Germaine on tele cart/ present

## 2024-10-23 NOTE — H&P (Signed)
 History and Physical    Patient: [name] FMW:[id number] DOB: Apr 24, 1929 DOA: 10/23/2024 DOS: the patient was seen and examined on 10/23/2024 PCP: Marvine Rush, MD  Patient coming from: Home  Chief Complaint:  Chief Complaint  Patient presents with   Code Stroke   HPI: [name] is a 89 y.o. female with medical history significant of hypertension, hyperlipidemia, T2DM, coronary artery disease status post STEMI 12/2022, prior stroke, dementia who presents to the emergency department from home via EMS for evaluation of acute right-sided weakness.  Patient was seen by her care giver around 10 AM this morning, she had breakfast and caregiver went to take care of other house chores, on returning to check on patient around 12:30 PM, she was not able to speak and the right side appears to be weak.  Son came in shortly after this and noted that patient was having difficulty in being able to communicate and same was confirmed by daughter who came in later in the afternoon.  EMS was activated and patient was sent to the ED for further evaluation and management.  Patient was incontinent, wheelchair-bound and requires assistance with ADLs at baseline.  ED Course In the emergency department, BP was elevated at 152/122, she was initially tachycardic, but this subsequently normalized.  Other vital signs are within normal range.  Workup in the ED showed normal CBC and BMP except for blood glucose of 202, alcohol level was undetectable.  Urine drug screen was negative.  Urinalysis pending. CT head without contrast showed no acute intracranial abnormality CT angiography of head and neck with and without contrast showed negative CT angiography for LVO or other emergent finding.  Atheromatous change about the left carotid bulb/proximal left ICA with up to 60% stenosis by NASCET criteria. Teleneurology was consulted and recommended admitting patient for further stroke workup.    Review of  Systems: As mentioned in the history of present illness. All other systems reviewed and are negative. Past Medical History:  Diagnosis Date   CAD (coronary artery disease)    a. 12/2022 Inf STEMI/PCI: LM nl, LAD mild diff dzs, D1 50, D2 50., LCX 75d, OM1 60, RCA 90p (2.5x20 Synergy XD DES), 100d (PTCA-->90 residual).   Diabetes mellitus with neuropathy    Diabetic neuropathy (HCC)    Diastolic dysfunction    a. 12/2022 Echo: EF 60-65%, no rwma, GrI DD, nl RV size/fxn, triv MR.   Hypertension    Stroke [facility])    Urinary incontinence    Past Surgical History:  Procedure Laterality Date   ABDOMINAL HYSTERECTOMY     APPENDECTOMY     BREAST SURGERY     begnin tumor removed   CATARACT EXTRACTION, BILATERAL     CORONARY ANGIOGRAPHY N/A 12/31/2022   Procedure: CORONARY ANGIOGRAPHY;  Surgeon: Mady Bruckner, MD;  Location: [facility] INVASIVE CV LAB;  Service: Cardiovascular;  Laterality: N/A;   CORONARY/GRAFT ACUTE MI REVASCULARIZATION N/A 12/31/2022   Procedure: Coronary/Graft Acute MI Revascularization;  Surgeon: Mady Bruckner, MD;  Location: [facility] INVASIVE CV LAB;  Service: Cardiovascular;  Laterality: N/A;   Social History:  reports that she has never smoked. She has never used smokeless tobacco. She reports that she does not drink alcohol and does not use drugs.  Allergies  Allergen Reactions   Codeine Nausea And Vomiting   Glipizide      Hypoglycemia with glucoses recorded in the 20s necessitating glucagon and D10 IV   Sulfa  Antibiotics Other (See Comments)    Unspecified  Family History  Problem Relation Age of Onset   CAD Mother    Hypertension Mother    Stroke Father    Heart attack Brother    Cancer Brother     Prior to Admission medications   Medication Sig Start Date End Date Taking? Authorizing Provider  acetaminophen  (TYLENOL ) 650 MG CR tablet Take 1 tablet (650 mg total) by mouth every 8 (eight) hours as needed for pain. 08/28/23   Leath-Warren, Etta PARAS, NP  aspirin  EC 81  MG tablet Take 1 tablet (81 mg total) by mouth daily. Swallow whole. 01/05/23   Dunn, Dayna N, PA-C  Cholecalciferol  (VITAMIN D3) 5000 units CAPS Take 1 capsule (5,000 Units total) by mouth daily. 04/15/17   Nida, Gebreselassie W, MD  gabapentin  (NEURONTIN ) 300 MG capsule Take 1 capsule (300 mg total) by mouth 2 (two) times daily. Take 300 mg three times daily Patient taking differently: Take 300-600 mg by mouth 2 (two) times daily. Take 600 mg (2 capsules) by mouth every morning and 300 mg (1 capsule) every evening. 05/05/23   Ricky Fines, MD  metFORMIN  (GLUCOPHAGE ) 500 MG tablet Take 500 mg by mouth 2 (two) times daily. 06/26/23   [provider]  metoprolol  succinate (TOPROL -XL) 50 MG 24 hr tablet Take 50 mg by mouth at bedtime. 04/01/23   [provider]  sertraline  (ZOLOFT ) 25 MG tablet Take 25 mg by mouth daily. Take with 50mg  dose to equal 75mg  daily.    [provider]  sertraline  (ZOLOFT ) 50 MG tablet Take 50 mg by mouth daily. Take with 25mg  dose to equal 75mg  daily.    [provider]  silodosin  (RAPAFLO ) 8 MG CAPS capsule Take 1 capsule (8 mg total) by mouth daily with breakfast. 10/10/22   Summerlin, Julienne Annette, PA-C  simvastatin  (ZOCOR ) 20 MG tablet Take 20 mg by mouth every evening. 06/24/23   [provider]  ticagrelor  (BRILINTA ) 90 MG TABS tablet Take 1 tablet (90 mg total) by mouth 2 (two) times daily. Patient not taking: Reported on 07/11/2023 01/04/23   Dunn, Dayna N, PA-C  Trospium  Chloride 60 MG CP24 Take 1 capsule by mouth daily. 04/01/23   [provider]    Physical Exam: Vitals:   10/23/24 1806 10/23/24 1900 10/23/24 1905 10/23/24 2000  BP:  (!) 152/122 (!) 170/138 (!) 256/206  Pulse:    (!) 193  Resp:  15 (!) 23 17  SpO2:    100%  Weight: 66.7 kg     Height: 5' 7 (1.702 m)      General: Elderly female.  Elderly female lying in bed. Not in any acute distress.  HEENT: NCAT.  PERRL. Sclerae anicteric.  Neck: Neck  supple without lymphadenopathy. No carotid bruits. No masses palpated.  Cardiovascular: Irregular rate and rhythm. No murmurs, rubs or gallops auscultated. No JVD.  Respiratory: Clear breath sounds.  No accessory muscle use. Abdomen: Soft, nontender, nondistended. Active bowel sounds. No masses or hepatosplenomegaly  Skin: No rashes, lesions, or ulcerations.  Dry, warm to touch. Musculoskeletal:  2+ dorsalis pedis and radial pulses.  Psychiatric:  Mood appropriate to current condition. Neurologic: Right-sided weakness (acute), bilateral lower extremity weakness (chronic). + Aphasia    Data Reviewed: EKG personally reviewed showed atrial fibrillation with RVR  Assessment and Plan: Right-sided weakness, aphasia, rule out acute ischemic stroke Patient will be admitted to telemetry unit  CT head without contrast showed no acute intracranial abnormality CT angiography of head and neck with and without contrast  showed negative CT angiography for LVO  Echocardiogram in the morning MRI of brain without contrast in the morning Continue fall precautions and neuro checks Lipid panel and hemoglobin A1c will be checked Continue PT/SLP/OT eval and treat Bedside swallow eval by nursing prior to diet Tele neurology will be consulted status post imaging studies  Acute febrile illness Patient was noted with a temperature of 100.5 F No known source of infectious process at this time Urinalysis pending Broad-spectrum antibiotics will be given at this time with plan to de-escalate based on known cause of infectious process. IV Toradol  15 mg x 1 will be given due to fever Blood culture pending  Type 2 diabetes mellitus with peripheral neuropathy  Continue ISS and hypoglycemia protocol   CAD (coronary artery disease) of artery bypass graft Continue statin, Toprol -XL after speech therapist's clearance Resume aspirin  and Brilinta  after negative MRI of brain   Mixed hyperlipidemia Continue statin  therapy after clearance from speech therapist   Depression Continue Zoloft  after clearance from speech therapist    Advance Care Planning: DNR  Consults: Neurology  Family Communication: Son and daughter at bedside (all questions answered to satisfaction)  Severity of Illness: The appropriate patient status for this patient is OBSERVATION. Observation status is judged to be reasonable and necessary in order to provide the required intensity of service to ensure the patient's safety. The patient's presenting symptoms, physical exam findings, and initial radiographic and laboratory data in the context of their medical condition is felt to place them at decreased risk for further clinical deterioration. Furthermore, it is anticipated that the patient will be medically stable for discharge from the hospital within 2 midnights of admission.   Author: Nathanael Krist, DO 10/23/2024 8:11 PM  For on call review [email or web address].

## 2024-10-23 NOTE — ED Notes (Addendum)
 CT scan beginning, tele neuro present, BP 207/122, CBG 171

## 2024-10-23 NOTE — ED Notes (Incomplete)
 Telecart back on line, neuro exam in progress, attempting CTA/perfusion IVs, difficult access.

## 2024-10-23 NOTE — ED Notes (Signed)
 Labs, VS, CBG in progress

## 2024-10-23 NOTE — ED Notes (Signed)
 CTA complete

## 2024-10-23 NOTE — ED Notes (Signed)
 Back to room 1 from CT, into room at this time. No obvious changes. Remains airway patent, alert, NAD, restless, fidgety, agitated, resps e/u, skin cool, dry and tenting.

## 2024-10-23 NOTE — ED Provider Notes (Signed)
 [facility] MEDICAL SURGICAL UNIT Provider Note  CSN: [id number] Arrival date & time: 10/23/24 1802  Chief Complaint(s) Code Stroke  HPI [name] is a 89 y.o. female history of prior coronary artery disease, diabetes, prior stroke presenting to the emergency department with possible stroke.  Patient last seen well around 10:30 AM to 12 PM.  Normally is demented and wheelchair-bound but able to converse.  Was noted to have lack of speech, left gaze deviation, right-sided weakness.  Patient brought in as a code stroke by paramedics.  History otherwise not able to be obtained due to altered mental status   Past Medical History Past Medical History:  Diagnosis Date   CAD (coronary artery disease)    a. 12/2022 Inf STEMI/PCI: LM nl, LAD mild diff dzs, D1 50, D2 50., LCX 75d, OM1 60, RCA 90p (2.5x20 Synergy XD DES), 100d (PTCA-->90 residual).   Diabetes mellitus with neuropathy    Diabetic neuropathy (HCC)    Diastolic dysfunction    a. 12/2022 Echo: EF 60-65%, no rwma, GrI DD, nl RV size/fxn, triv MR.   Hypertension    Stroke [facility])    Urinary incontinence    Patient Active Problem List   Diagnosis Date Noted   Acute right-sided weakness 10/23/2024   Altered mental status 07/11/2023   Acute lower UTI 07/10/2023   Dyslipidemia 07/10/2023   Type 2 diabetes mellitus with peripheral neuropathy (HCC) 07/10/2023   Hypoalbuminemia due to protein-calorie malnutrition 07/05/2023   Impaction of colon (HCC) 06/28/2023   DNR (do not resuscitate) 06/28/2023   Dehydration 05/05/2023   Hypoglycemia 02/10/2023   Chest pain 02/10/2023   Depression 02/10/2023   CAD (coronary artery disease) of artery bypass graft 01/04/2023   Pressure injury of skin 01/04/2023   Physical debility 01/04/2023   Leukocytosis 01/04/2023   Acute ST elevation myocardial infarction (STEMI) of inferior wall (HCC) 12/31/2022   SARS-CoV-2 positive 08/24/2022   SARS-CoV-2 antibody positive 08/23/2022   Emphysema  of lung (HCC) 08/21/2022   Vascular dementia without behavioral disturbance (HCC) 08/21/2022   Decubitus ulcer of buttock, stage 2 (HCC) 08/13/2022   Hypomagnesemia 08/03/2022   Hypoglycemia associated with diabetes (HCC) 08/02/2022   Hypokalemia 08/02/2022   Uncontrolled type 2 diabetes mellitus with hyperglycemia, without long-term current use of insulin  (HCC) 07/19/2022   Diabetic neuropathy associated with type 2 diabetes mellitus (HCC) 07/16/2022   Hypertension associated with type 2 diabetes mellitus (HCC) 07/16/2022   Hyperlipidemia associated with type 2 diabetes mellitus (HCC) 07/16/2022   Chronic insomnia 07/16/2022   Bilateral lower extremity edema 07/16/2022   Chronic constipation 07/16/2022   Aortic atherosclerosis 07/16/2022   Major neurocognitive disorder (HCC) 07/16/2022   DJD (degenerative joint disease) of knee 07/14/2022   UTI (urinary tract infection) 07/09/2022   Late effects of cerebrovascular disease 09/25/2021   Acute metabolic encephalopathy 07/14/2019   Cellulitis of buttock 07/14/2019   Hyperlipidemia 02/20/2016   Essential hypertension 10/10/2015   Controlled type 2 diabetes with neuropathy (HCC) 10/01/2008   Home Medication(s) Prior to Admission medications   Medication Sig Start Date End Date Taking? Authorizing Provider  acetaminophen  (TYLENOL ) 650 MG CR tablet Take 1 tablet (650 mg total) by mouth every 8 (eight) hours as needed for pain. 08/28/23   Leath-Warren, Etta PARAS, NP  aspirin  EC 81 MG tablet Take 1 tablet (81 mg total) by mouth daily. Swallow whole. 01/05/23   Dunn, Dayna N, PA-C  Cholecalciferol  (VITAMIN D3) 5000 units CAPS Take 1 capsule (5,000 Units total) by mouth  daily. 04/15/17   Nida, Gebreselassie W, MD  gabapentin  (NEURONTIN ) 300 MG capsule Take 1 capsule (300 mg total) by mouth 2 (two) times daily. Take 300 mg three times daily Patient taking differently: Take 300-600 mg by mouth 2 (two) times daily. Take 600 mg (2 capsules) by mouth  every morning and 300 mg (1 capsule) every evening. 05/05/23   Ricky Fines, MD  metFORMIN  (GLUCOPHAGE ) 500 MG tablet Take 500 mg by mouth 2 (two) times daily. 06/26/23   [provider]  metoprolol  succinate (TOPROL -XL) 50 MG 24 hr tablet Take 50 mg by mouth at bedtime. 04/01/23   [provider]  sertraline  (ZOLOFT ) 25 MG tablet Take 25 mg by mouth daily. Take with 50mg  dose to equal 75mg  daily.    [provider]  sertraline  (ZOLOFT ) 50 MG tablet Take 50 mg by mouth daily. Take with 25mg  dose to equal 75mg  daily.    [provider]  silodosin  (RAPAFLO ) 8 MG CAPS capsule Take 1 capsule (8 mg total) by mouth daily with breakfast. 10/10/22   Summerlin, Julienne Annette, PA-C  simvastatin  (ZOCOR ) 20 MG tablet Take 20 mg by mouth every evening. 06/24/23   [provider]  ticagrelor  (BRILINTA ) 90 MG TABS tablet Take 1 tablet (90 mg total) by mouth 2 (two) times daily. Patient not taking: Reported on 07/11/2023 01/04/23   Dunn, Dayna N, PA-C  Trospium  Chloride 60 MG CP24 Take 1 capsule by mouth daily. 04/01/23   [provider]                                                                                                                                    Past Surgical History Past Surgical History:  Procedure Laterality Date   ABDOMINAL HYSTERECTOMY     APPENDECTOMY     BREAST SURGERY     begnin tumor removed   CATARACT EXTRACTION, BILATERAL     CORONARY ANGIOGRAPHY N/A 12/31/2022   Procedure: CORONARY ANGIOGRAPHY;  Surgeon: Mady Bruckner, MD;  Location: [facility] INVASIVE CV LAB;  Service: Cardiovascular;  Laterality: N/A;   CORONARY/GRAFT ACUTE MI REVASCULARIZATION N/A 12/31/2022   Procedure: Coronary/Graft Acute MI Revascularization;  Surgeon: Mady Bruckner, MD;  Location: [facility] INVASIVE CV LAB;  Service: Cardiovascular;  Laterality: N/A;   Family History Family History  Problem Relation Age of Onset   CAD Mother    Hypertension Mother    Stroke  Father    Heart attack Brother    Cancer Brother     Social History Social History   Tobacco Use   Smoking status: Never   Smokeless tobacco: Never  Vaping Use   Vaping status: Never Used  Substance Use Topics   Alcohol use: No   Drug use: No   Allergies Codeine, Glipizide , and Sulfa  antibiotics  Review of Systems Review of Systems  All other systems reviewed and are negative.   Physical Exam Vital Signs  I  have reviewed the triage vital signs BP (!) 162/100   Pulse 75   Temp (!) 97.3 F (36.3 C)   Resp (!) 22   Ht 5' 7 (1.702 m)   Wt 66.7 kg   SpO2 100%   BMI 23.02 kg/m  Physical Exam Vitals and nursing note reviewed.  Constitutional:      General: She is in acute distress.     Appearance: She is well-developed.  HENT:     Head: Normocephalic and atraumatic.     Mouth/Throat:     Mouth: Mucous membranes are moist.  Eyes:     Pupils: Pupils are equal, round, and reactive to light.  Cardiovascular:     Rate and Rhythm: Normal rate and regular rhythm.     Heart sounds: No murmur heard. Pulmonary:     Effort: Pulmonary effort is normal. No respiratory distress.     Breath sounds: Normal breath sounds.  Abdominal:     General: Abdomen is flat.     Palpations: Abdomen is soft.     Tenderness: There is no abdominal tenderness.  Musculoskeletal:        General: No tenderness.     Right lower leg: No edema.     Left lower leg: No edema.  Skin:    General: Skin is warm and dry.  Neurological:     Mental Status: She is alert.     Comments: No spontaneous speech, left gaze deviation, placidity of the right arm and right leg.  No seizure activity  Psychiatric:        Mood and Affect: Mood normal.        Behavior: Behavior normal.     ED Results and Treatments Labs (all labs ordered are listed, but only abnormal results are displayed) Labs Reviewed  COMPREHENSIVE METABOLIC PANEL WITH GFR - Abnormal; Notable for the following components:      Result  Value   Glucose, Bld 202 (*)    Anion gap 16 (*)    All other components within normal limits  PROTIME-INR  APTT  CBC  DIFFERENTIAL  ETHANOL  URINE DRUG SCREEN  I-STAT CHEM 8, ED                                                                                                                          Radiology CT ANGIO HEAD NECK W WO CM (CODE STROKE) Result Date: 10/23/2024 CLINICAL DATA:  Initial evaluation for acute neuro deficit, stroke suspected. Right-sided deficits. EXAM: CT ANGIOGRAPHY HEAD AND NECK WITH AND WITHOUT CONTRAST TECHNIQUE: Multidetector CT imaging of the head and neck was performed using the standard protocol during bolus administration of intravenous contrast. Multiplanar CT image reconstructions and MIPs were obtained to evaluate the vascular anatomy. Carotid stenosis measurements (when applicable) are obtained utilizing NASCET criteria, using the distal internal carotid diameter as the denominator. RADIATION DOSE REDUCTION: This exam was performed according to the departmental dose-optimization program which includes automated exposure control, adjustment  of the mA and/or kV according to patient size and/or use of iterative reconstruction technique. CONTRAST:  60mL OMNIPAQUE  IOHEXOL  350 MG/ML SOLN COMPARISON:  Comparison made with head CT performed earlier the same day. FINDINGS: CTA NECK FINDINGS Aortic arch: Visualized aortic arch within normal limits for caliber. Origin of the great vessels incompletely visualized on this exam. Aortic atherosclerosis. No visible stenosis about the origin the great vessels. Right carotid system: Right common and internal carotid arteries are patent without dissection. Eccentric calcified plaque about the right carotid bulb without hemodynamically significant stenosis. Left carotid system: Left common and internal carotid arteries are patent without dissection. Scattered calcified plaque about the left carotid bulb/proximal left ICA with up to  60% stenosis by NASCET criteria. Vertebral arteries: Left vertebral artery dominant. Vertebral arteries are patent without stenosis or dissection. Skeleton: No worrisome osseous lesions. Advanced multilevel cervical spondylosis noted. Other neck: No other acute finding. Upper chest: No other acute finding. Review of the MIP images confirms the above findings CTA HEAD FINDINGS Anterior circulation: Atheromatous change about the carotid siphons without hemodynamically significant stenosis. A1 segments patent bilaterally. Normal anterior communicating artery complex. Anterior cerebral arteries patent without significant stenosis. No M1 stenosis or occlusion. No proximal MCA branch occlusion or high-grade stenosis. Distal MCA branches perfused and symmetric. Posterior circulation: Both V4 segments patent without significant stenosis. Left PICA patent. Right PICA origin not well seen. Basilar patent without stenosis. Superior cerebellar arteries patent bilaterally. Left PCA supplied via the basilar. Right PCA supplied via a hypoplastic right P1 segment and prominent right posterior communicating artery. Both PCAs patent to their distal aspects without stenosis. Venous sinuses: Grossly patent allowing for timing the contrast bolus. Anatomic variants: As above.  No aneurysm. Review of the MIP images confirms the above findings IMPRESSION: 1. Negative CTA for large vessel occlusion or other emergent finding. 2. Atheromatous change about the left carotid bulb/proximal left ICA with up to 60% stenosis by NASCET criteria. 3. Additional mild atheromatous change about the right carotid bulb and carotid siphons without hemodynamically significant stenosis. Aortic Atherosclerosis (ICD10-I70.0). These results were communicated to Dr. Germaine at 7:03 pm on 10/23/2024 by text page via the [facility] messaging system. Electronically Signed   By: Morene Hoard M.D.   On: 10/23/2024 19:18   CT HEAD CODE STROKE WO CONTRAST (LKW  0-4.5h, LVO 0-24h) Result Date: 10/23/2024 EXAM: CT HEAD WITHOUT 10/23/2024 06:14:00 PM TECHNIQUE: CT of the head was performed without the administration of intravenous contrast. Automated exposure control, iterative reconstruction, and/or weight based adjustment of the mA/kV was utilized to reduce the radiation dose to as low as reasonably achievable. COMPARISON: None available. CLINICAL HISTORY: Neuro deficit, acute, stroke suspected. FINDINGS: BRAIN AND VENTRICLES: No acute intracranial hemorrhage. No mass effect or midline shift. No extra-axial fluid collection. No evidence of acute infarct. No hydrocephalus. Generalized parenchymal volume loss and chronic small vessel ischemic changes. [place] Stroke Program Early CT Score (ASPECTS) Ganglionic (caudate, IC, lentiform nucleus, insula, M1-M3): 7 Supraganglionic (M4-M6): 3 Total: 10 ORBITS: No acute abnormality. SINUSES AND MASTOIDS: No acute abnormality. SOFT TISSUES AND SKULL: No acute skull fracture. No acute soft tissue abnormality. IMPRESSION: 1. No acute intracranial abnormality. 2. Generalized parenchymal volume loss and chronic small vessel ischemic changes. 3. ASPECTS: 10. Dr. Germaine paged at 6:18 PM Electronically signed by: Franky Stanford MD 10/23/2024 06:19 PM EDT RP Workstation: [id number]    Pertinent labs & imaging results that were available during my care of the patient were reviewed by me and considered  in my medical decision making (see MDM for details).  Medications Ordered in ED Medications  haloperidol  lactate (HALDOL ) injection 1 mg (1 mg Intravenous Not Given 10/23/24 2131)  iohexol  (OMNIPAQUE ) 350 MG/ML injection 60 mL (60 mLs Intravenous Contrast Given 10/23/24 1854)                                                                                                                                     Procedures .Critical Care  Performed by: Francesca Elsie CROME, MD Authorized by: Francesca Elsie CROME, MD   Critical care  provider statement:    Critical care time (minutes):  30   Critical care was time spent personally by me on the following activities:  Development of treatment plan with patient or surrogate, discussions with consultants, evaluation of patient's response to treatment, examination of patient, ordering and review of laboratory studies, ordering and review of radiographic studies, ordering and performing treatments and interventions, pulse oximetry, re-evaluation of patient's condition and review of old charts   (including critical care time)  Medical Decision Making / ED Course   MDM:  89 year old presenting to the emergency department with unilateral deficit.  Examination concerning for likely stroke.  Differentials include seizures, intracranial bleeding.  Patient was taken emergently to CT scan.  No bleeding was seen on scan.  TNK was considered however patient outside TNK window.  CTA head no evidence of LVO.  Discussed with neurology, recommend admission for MRI, EEG.  Differential also includes seizure although no seizure activity witnessed.  Patient is admitted to the hospitalist for further care.  Findings discussed with the patient's daughter.  Prognosis is guarded given the patient's underlying dementia significantly abnormal exam     Additional history obtained: -Additional history obtained from family and ems -External records from outside source obtained and reviewed including: Chart review including previous notes, labs, imaging, consultation notes including prior notes    Lab Tests: -I ordered, reviewed, and interpreted labs.   The pertinent results include:   Labs Reviewed  COMPREHENSIVE METABOLIC PANEL WITH GFR - Abnormal; Notable for the following components:      Result Value   Glucose, Bld 202 (*)    Anion gap 16 (*)    All other components within normal limits  PROTIME-INR  APTT  CBC  DIFFERENTIAL  ETHANOL  URINE DRUG SCREEN  I-STAT CHEM 8, ED    Notable for  nonspecific mild hyperglycemia    Imaging Studies ordered: I ordered imaging studies including CT head On my interpretation imaging demonstrates no acute bleeding I independently visualized and interpreted imaging. I agree with the radiologist interpretation   Medicines ordered and prescription drug management: Meds ordered this encounter  Medications   iohexol  (OMNIPAQUE ) 350 MG/ML injection 60 mL   haloperidol  lactate (HALDOL ) injection 1 mg    -I have reviewed the patients home medicines and have made adjustments as needed   Consultations Obtained: I  requested consultation with the neurology,  and discussed lab and imaging findings as well as pertinent plan - they recommend: admission   Reevaluation: After the interventions noted above, I reevaluated the patient and found that their symptoms have stayed the same  Co morbidities that complicate the patient evaluation  Past Medical History:  Diagnosis Date   CAD (coronary artery disease)    a. 12/2022 Inf STEMI/PCI: LM nl, LAD mild diff dzs, D1 50, D2 50., LCX 75d, OM1 60, RCA 90p (2.5x20 Synergy XD DES), 100d (PTCA-->90 residual).   Diabetes mellitus with neuropathy    Diabetic neuropathy (HCC)    Diastolic dysfunction    a. 12/2022 Echo: EF 60-65%, no rwma, GrI DD, nl RV size/fxn, triv MR.   Hypertension    Stroke [facility])    Urinary incontinence       Dispostion: Disposition decision including need for hospitalization was considered, and patient admitted to the hospital.    Final Clinical Impression(s) / ED Diagnoses Final diagnoses:  Stroke-like symptoms     This chart was dictated using voice recognition software.  Despite best efforts to proofread,  errors can occur which can change the documentation meaning.    Francesca Elsie CROME, MD 10/23/24 2135

## 2024-10-23 NOTE — ED Notes (Addendum)
 IV established, CTA beginning

## 2024-10-24 ENCOUNTER — Observation Stay (HOSPITAL_COMMUNITY)

## 2024-10-24 DIAGNOSIS — R159 Full incontinence of feces: Secondary | ICD-10-CM | POA: Diagnosis present

## 2024-10-24 DIAGNOSIS — Z66 Do not resuscitate: Secondary | ICD-10-CM | POA: Diagnosis present

## 2024-10-24 DIAGNOSIS — G8191 Hemiplegia, unspecified affecting right dominant side: Secondary | ICD-10-CM | POA: Diagnosis present

## 2024-10-24 DIAGNOSIS — I252 Old myocardial infarction: Secondary | ICD-10-CM | POA: Diagnosis not present

## 2024-10-24 DIAGNOSIS — F0153 Vascular dementia, unspecified severity, with mood disturbance: Secondary | ICD-10-CM | POA: Diagnosis present

## 2024-10-24 DIAGNOSIS — Z7982 Long term (current) use of aspirin: Secondary | ICD-10-CM | POA: Diagnosis not present

## 2024-10-24 DIAGNOSIS — I2510 Atherosclerotic heart disease of native coronary artery without angina pectoris: Secondary | ICD-10-CM

## 2024-10-24 DIAGNOSIS — Z7902 Long term (current) use of antithrombotics/antiplatelets: Secondary | ICD-10-CM | POA: Diagnosis not present

## 2024-10-24 DIAGNOSIS — G9341 Metabolic encephalopathy: Secondary | ICD-10-CM | POA: Diagnosis present

## 2024-10-24 DIAGNOSIS — I10 Essential (primary) hypertension: Secondary | ICD-10-CM

## 2024-10-24 DIAGNOSIS — F32A Depression, unspecified: Secondary | ICD-10-CM | POA: Diagnosis present

## 2024-10-24 DIAGNOSIS — G459 Transient cerebral ischemic attack, unspecified: Secondary | ICD-10-CM | POA: Diagnosis present

## 2024-10-24 DIAGNOSIS — R531 Weakness: Secondary | ICD-10-CM | POA: Diagnosis not present

## 2024-10-24 DIAGNOSIS — E1165 Type 2 diabetes mellitus with hyperglycemia: Secondary | ICD-10-CM | POA: Diagnosis present

## 2024-10-24 DIAGNOSIS — R4701 Aphasia: Secondary | ICD-10-CM | POA: Diagnosis present

## 2024-10-24 DIAGNOSIS — R509 Fever, unspecified: Secondary | ICD-10-CM | POA: Diagnosis present

## 2024-10-24 DIAGNOSIS — M6282 Rhabdomyolysis: Secondary | ICD-10-CM | POA: Diagnosis present

## 2024-10-24 DIAGNOSIS — I25119 Atherosclerotic heart disease of native coronary artery with unspecified angina pectoris: Secondary | ICD-10-CM | POA: Diagnosis not present

## 2024-10-24 DIAGNOSIS — E7849 Other hyperlipidemia: Secondary | ICD-10-CM | POA: Diagnosis present

## 2024-10-24 DIAGNOSIS — R001 Bradycardia, unspecified: Secondary | ICD-10-CM | POA: Diagnosis not present

## 2024-10-24 DIAGNOSIS — E1142 Type 2 diabetes mellitus with diabetic polyneuropathy: Secondary | ICD-10-CM | POA: Diagnosis present

## 2024-10-24 DIAGNOSIS — Z8616 Personal history of COVID-19: Secondary | ICD-10-CM | POA: Diagnosis not present

## 2024-10-24 DIAGNOSIS — E1169 Type 2 diabetes mellitus with other specified complication: Secondary | ICD-10-CM | POA: Diagnosis present

## 2024-10-24 DIAGNOSIS — E876 Hypokalemia: Secondary | ICD-10-CM | POA: Diagnosis present

## 2024-10-24 DIAGNOSIS — Z7984 Long term (current) use of oral hypoglycemic drugs: Secondary | ICD-10-CM | POA: Diagnosis not present

## 2024-10-24 DIAGNOSIS — R32 Unspecified urinary incontinence: Secondary | ICD-10-CM | POA: Diagnosis present

## 2024-10-24 LAB — ECHOCARDIOGRAM COMPLETE
Area-P 1/2: 2.95 cm2
Height: 67 in
S' Lateral: 2.53 cm
Weight: 2352 [oz_av]

## 2024-10-24 LAB — POCT I-STAT, CHEM 8
BUN: 15 mg/dL (ref 8–23)
Calcium, Ion: 1.18 mmol/L (ref 1.15–1.40)
Chloride: 105 mmol/L (ref 98–111)
Creatinine, Ser: 0.7 mg/dL (ref 0.44–1.00)
Glucose, Bld: 200 mg/dL — ABNORMAL HIGH (ref 70–99)
HCT: 39 % (ref 36.0–46.0)
Hemoglobin: 13.3 g/dL (ref 12.0–15.0)
Potassium: 3.8 mmol/L (ref 3.5–5.1)
Sodium: 140 mmol/L (ref 135–145)
TCO2: 20 mmol/L — ABNORMAL LOW (ref 22–32)

## 2024-10-24 LAB — COMPREHENSIVE METABOLIC PANEL WITH GFR
ALT: 9 U/L (ref 0–44)
AST: 19 U/L (ref 15–41)
Albumin: 4.3 g/dL (ref 3.5–5.0)
Alkaline Phosphatase: 93 U/L (ref 38–126)
Anion gap: 24 — ABNORMAL HIGH (ref 5–15)
BUN: 14 mg/dL (ref 8–23)
CO2: 16 mmol/L — ABNORMAL LOW (ref 22–32)
Calcium: 9.3 mg/dL (ref 8.9–10.3)
Chloride: 97 mmol/L — ABNORMAL LOW (ref 98–111)
Creatinine, Ser: 0.78 mg/dL (ref 0.44–1.00)
GFR, Estimated: >60 mL/min (ref >60)
Glucose, Bld: 326 mg/dL — ABNORMAL HIGH (ref 70–99)
Potassium: 3 mmol/L — ABNORMAL LOW (ref 3.5–5.1)
Sodium: 137 mmol/L (ref 135–145)
Total Bilirubin: 0.7 mg/dL (ref 0.0–1.2)
Total Protein: 7.1 g/dL (ref 6.5–8.1)

## 2024-10-24 LAB — LIPID PANEL
Cholesterol: 167 mg/dL (ref 0–200)
HDL: 70 mg/dL (ref >40)
LDL Cholesterol: 78 mg/dL (ref 0–99)
Total CHOL/HDL Ratio: 2.4 ratio
Triglycerides: 96 mg/dL (ref <150)
VLDL: 19 mg/dL (ref 0–40)

## 2024-10-24 LAB — CBC
HCT: 39.5 % (ref 36.0–46.0)
Hemoglobin: 13.5 g/dL (ref 12.0–15.0)
MCH: 29.9 pg (ref 26.0–34.0)
MCHC: 34.2 g/dL (ref 30.0–36.0)
MCV: 87.4 fL (ref 80.0–100.0)
Platelets: 259 K/uL (ref 150–400)
RBC: 4.52 MIL/uL (ref 3.87–5.11)
RDW: 13.2 % (ref 11.5–15.5)
WBC: 17.6 K/uL — ABNORMAL HIGH (ref 4.0–10.5)
nRBC: 0 % (ref 0.0–0.2)

## 2024-10-24 LAB — URINALYSIS, ROUTINE W REFLEX MICROSCOPIC
Bilirubin Urine: NEGATIVE
Glucose, UA: >=500 mg/dL — AB
Ketones, ur: 5 mg/dL — AB
Nitrite: NEGATIVE
Protein, ur: 30 mg/dL — AB
Specific Gravity, Urine: 1.015 (ref 1.005–1.030)
pH: 7 (ref 5.0–8.0)

## 2024-10-24 LAB — GLUCOSE, CAPILLARY: Glucose-Capillary: 202 mg/dL — ABNORMAL HIGH (ref 70–99)

## 2024-10-24 LAB — HEMOGLOBIN A1C
Hgb A1c MFr Bld: 7.4 % — ABNORMAL HIGH (ref 4.8–5.6)
Mean Plasma Glucose: 165.68 mg/dL

## 2024-10-24 LAB — PHOSPHORUS: Phosphorus: 3 mg/dL (ref 2.5–4.6)

## 2024-10-24 LAB — MAGNESIUM: Magnesium: 1.5 mg/dL — ABNORMAL LOW (ref 1.7–2.4)

## 2024-10-24 MED ORDER — SERTRALINE HCL 25 MG PO TABS
75.0000 mg | ORAL_TABLET | Freq: Every day | ORAL | Status: DC
Start: 2024-10-24 — End: 2024-10-26
  Administered 2024-10-24 – 2024-10-26 (×3): 75 mg via ORAL
  Filled 2024-10-24 (×3): qty 3

## 2024-10-24 MED ORDER — LACTATED RINGERS IV SOLN: INTRAVENOUS | Status: AC

## 2024-10-24 MED ORDER — TROSPIUM CHLORIDE ER 60 MG PO CP24: 1.0000 | ORAL_CAPSULE | Freq: Every day | ORAL | Status: DC

## 2024-10-24 MED ORDER — FESOTERODINE FUMARATE ER 4 MG PO TB24
4.0000 mg | ORAL_TABLET | Freq: Every day | ORAL | Status: DC
  Administered 2024-10-25 – 2024-10-26 (×2): 4 mg via ORAL
  Filled 2024-10-24 (×2): qty 1

## 2024-10-24 MED ORDER — VANCOMYCIN HCL IN DEXTROSE 1-5 GM/200ML-% IV SOLN
1000.0000 mg | INTRAVENOUS | Status: DC
  Administered 2024-10-24 – 2024-10-25 (×2): 1000 mg via INTRAVENOUS
  Filled 2024-10-24 (×2): qty 200

## 2024-10-24 MED ORDER — KETOROLAC TROMETHAMINE 15 MG/ML IJ SOLN: 15.0000 mg | Freq: Once | INTRAMUSCULAR | Status: DC

## 2024-10-24 MED ORDER — POTASSIUM CHLORIDE 10 MEQ/100ML IV SOLN
10.0000 meq | INTRAVENOUS | Status: AC
  Administered 2024-10-24 (×3): 10 meq via INTRAVENOUS
  Filled 2024-10-24 (×3): qty 100

## 2024-10-24 MED ORDER — SERTRALINE HCL 50 MG PO TABS: 50.0000 mg | ORAL_TABLET | Freq: Every day | ORAL | Status: DC

## 2024-10-24 MED ORDER — MAGNESIUM SULFATE IN D5W 1-5 GM/100ML-% IV SOLN
1.0000 g | Freq: Once | INTRAVENOUS | Status: AC
  Administered 2024-10-24: 1 g via INTRAVENOUS
  Filled 2024-10-24: qty 100

## 2024-10-24 MED ORDER — TICAGRELOR 90 MG PO TABS
90.0000 mg | ORAL_TABLET | Freq: Two times a day (BID) | ORAL | Status: DC
  Administered 2024-10-24 – 2024-10-26 (×5): 90 mg via ORAL
  Filled 2024-10-24 (×5): qty 1

## 2024-10-24 MED ORDER — POTASSIUM CHLORIDE 10 MEQ/100ML IV SOLN
10.0000 meq | INTRAVENOUS | Status: DC
Start: 2024-10-24 — End: 2024-10-24
  Administered 2024-10-24: 10 meq via INTRAVENOUS
  Filled 2024-10-24: qty 100

## 2024-10-24 MED ORDER — TICAGRELOR 90 MG PO TABS: 90.0000 mg | ORAL_TABLET | Freq: Two times a day (BID) | ORAL | Status: DC

## 2024-10-24 MED ORDER — METOPROLOL SUCCINATE ER 50 MG PO TB24
50.0000 mg | ORAL_TABLET | Freq: Every day | ORAL | Status: DC
  Administered 2024-10-24: 50 mg via ORAL
  Filled 2024-10-24: qty 1

## 2024-10-24 MED ORDER — ASPIRIN 81 MG PO TBEC: 81.0000 mg | DELAYED_RELEASE_TABLET | Freq: Every day | ORAL | Status: DC

## 2024-10-24 NOTE — Care Management Obs Status (Signed)
 MEDICARE OBSERVATION STATUS NOTIFICATION   Patient Details  Name: [name] MRN: [id number] Date of Birth: 1929/11/09   Medicare Observation Status Notification Given:  Yes    Sharlyne Stabs, RN 10/24/2024, 1:51 PM

## 2024-10-24 NOTE — Evaluation (Signed)
 Physical Therapy Evaluation Patient Details Name: [name] MRN: [id number] DOB: 14-Sep-1929 Today's Date: 10/24/2024  History of Present Illness  [name] is a 89 y.o. female with medical history significant of hypertension, hyperlipidemia, T2DM, coronary artery disease status post STEMI 12/2022, prior stroke, dementia who presents to the emergency department from home via EMS for evaluation of acute right-sided weakness.  Patient was seen by her care giver around 10 AM this morning, she had breakfast and caregiver went to take care of other house chores, on returning to check on patient around 12:30 PM, she was not able to speak and the right side appears to be weak.  Son came in shortly after this and noted that patient was having difficulty in being able to communicate and same was confirmed by daughter who came in later in the afternoon.  EMS was activated and patient was sent to the ED for further evaluation and management.  Patient was incontinent, wheelchair-bound and requires assistance with ADLs at baseline.    Clinical Impression  On therapist arrival, patient is sleeping.  She does rouse with greeting and turning lights on in the room.  Patient unable initially to answer questions or follow commands.  She needs max A to roll each way in bed.  Her daughter and husband arrive to the room and she visibly perks up.  She is able to answer some yes and no questions but unable to clearly enunciate any other words.  Per her daughter she is usually very talkative.  Patient can follow some simple one step commands about 50% of the time such as squeeze my hand.  patient left in bed with call button in reach, bed alarm set, family in the room and nursing notified of mobility status.  Patient will benefit from continued skilled therapy services during the remainder of her hospital stay and at the next recommended venue of care to address deficits and promote return to optimal function.              If plan is discharge home, recommend the following: A lot of help with walking and/or transfers;A lot of help with bathing/dressing/bathroom;Help with stairs or ramp for entrance   Can travel by private vehicle   No    Equipment Recommendations None recommended by PT  Recommendations for Other Services       Functional Status Assessment Patient has had a recent decline in their functional status and demonstrates the ability to make significant improvements in function in a reasonable and predictable amount of time.     Precautions / Restrictions Precautions Precautions: Fall Recall of Precautions/Restrictions: Impaired Restrictions Weight Bearing Restrictions Per Provider Order: No      Mobility  Bed Mobility Overal bed mobility: Needs Assistance Bed Mobility: Rolling Rolling: Max assist         General bed mobility comments: max A for rolling in bed    Transfers                   General transfer comment: did not attempt transfer as patient needed max assist with lower level of mobility    Ambulation/Gait               General Gait Details: non ambulatory at baseline  Stairs            Wheelchair Mobility     Tilt Bed    Modified Rankin (Stroke Patients Only)       Balance  Pertinent Vitals/Pain Pain Assessment Pain Assessment: No/denies pain    Home Living Family/patient expects to be discharged to:: Private residence Living Arrangements: Children;Spouse/significant other Available Help at Discharge: Family;Personal care attendant;Available 24 hours/day Type of Home: House Home Access: Ramped entrance     Alternate Level Stairs-Number of Steps: lives on main level Home Layout: Two level Home Equipment: Wheelchair - manual;BSC/3in1;Grab bars - tub/shower;Grab bars - toilet;Shower seat;Hospital bed Additional Comments: per family at bedside patient  is incontinent and WC bound; has dementia but is normally verbal    Prior Function Prior Level of Function : Needs assist       Physical Assist : Mobility (physical);ADLs (physical) Mobility (physical): Bed mobility;Transfers ADLs (physical): Bathing;Dressing;Grooming;Feeding Mobility Comments: WC bound ADLs Comments: 24/7 assist from family and caregivers     Extremity/Trunk Assessment        Lower Extremity Assessment Lower Extremity Assessment: Generalized weakness       Communication   Communication Communication:  (HOH) Factors Affecting Communication: Hearing impaired;Difficulty expressing self;Reduced clarity of speech    Cognition Arousal: Alert                             PT - Cognition Comments: Patient able to answer yes and no questions 50% of the time during assessment Following commands: Impaired Following commands impaired: Follows one step commands inconsistently, Follows one step commands with increased time     Cueing Cueing Techniques: Verbal cues, Gestural cues     General Comments General comments (skin integrity, edema, etc.): did not attempt to sit up on the edge of the bed    Exercises     Assessment/Plan    PT Assessment Patient needs continued PT services  PT Problem List Decreased strength;Decreased cognition;Decreased activity tolerance;Decreased mobility       PT Treatment Interventions Functional mobility training;Therapeutic activities;Therapeutic exercise;Patient/family education;Wheelchair mobility training    PT Goals (Current goals can be found in the Care Plan section)  Acute Rehab PT Goals Patient Stated Goal: patient unable to express goal PT Goal Formulation: Patient unable to participate in goal setting Time For Goal Achievement: 11/07/24    Frequency Min 3X/week     Co-evaluation               AM-PAC PT 6 Clicks Mobility  Outcome Measure Help needed turning from your back to your side  while in a flat bed without using bedrails?: Total Help needed moving from lying on your back to sitting on the side of a flat bed without using bedrails?: Total Help needed moving to and from a bed to a chair (including a wheelchair)?: Total Help needed standing up from a chair using your arms (e.g., wheelchair or bedside chair)?: Total Help needed to walk in hospital room?: Total Help needed climbing 3-5 steps with a railing? : Total 6 Click Score: 6    End of Session     Patient left: in bed;with call bell/phone within reach;with bed alarm set;with family/visitor present Nurse Communication: Mobility status PT Visit Diagnosis: Muscle weakness (generalized) (M62.81);Other abnormalities of gait and mobility (R26.89)    Time: 8991-8971 PT Time Calculation (min) (ACUTE ONLY): 20 min   Charges:   PT Evaluation $PT Eval Moderate Complexity: 1 Mod   PT General Charges $$ ACUTE PT VISIT: 1 Visit         11:17 AM, 10/24/24 Isabella Ida Small Matvey Llanas MPT [facility] [place] [phone] [phone]

## 2024-10-24 NOTE — Progress Notes (Signed)
 PROGRESS NOTE    [name]  FMW:[id number] DOB: 28-Jul-1929 DOA: 10/23/2024 PCP: Marvine Rush, MD   Brief Narrative:    [name] is a 89 y.o. female with medical history significant of hypertension, hyperlipidemia, T2DM, coronary artery disease status post STEMI 12/2022, prior stroke, dementia who presents to the emergency department from home via EMS for evaluation of acute right-sided weakness.  Patient was seen by her care giver around 10 AM this morning, she had breakfast and caregiver went to take care of other house chores, on returning to check on patient around 12:30 PM, she was not able to speak and the right side appears to be weak.  Son came in shortly after this and noted that patient was having difficulty in being able to communicate and same was confirmed by daughter who came in later in the afternoon.  EMS was activated and patient was sent to the ED for further evaluation and management.  Patient was incontinent, wheelchair-bound and requires assistance with ADLs at baseline.   ED Course In the emergency department, BP was elevated at 152/122, she was initially tachycardic, but this subsequently normalized.  Other vital signs are within normal range.  Workup in the ED showed normal CBC and BMP except for blood glucose of 202, alcohol level was undetectable.  Urine drug screen was negative.  Urinalysis pending. CT head without contrast showed no acute intracranial abnormality CT angiography of head and neck with and without contrast showed negative CT angiography for LVO or other emergent finding.  Atheromatous change about the left carotid bulb/proximal left ICA with up to 60% stenosis by NASCET criteria. Teleneurology was consulted and recommended admitting patient for further stroke workup.   Assessment & Plan:   Principal Problem:   Acute right-sided weakness   Assessment and Plan: Right-sided weakness, aphasia, rule out acute ischemic stroke Patient  will be admitted to telemetry unit  CT head without contrast showed no acute intracranial abnormality CT angiography of head and neck with and without contrast showed negative CT angiography for LVO  Teleneurology consulted  Stroke workup, echocardiogram, MRI pending Continue fall precautions and neuro checks Lipid panel and hemoglobin A1c will be checked Continue PT/SLP/OT eval and treat Bedside swallow eval by nursing prior to diet Aspirin  and statin.  Per MAR review, patient has been on dual antiplatelet therapy with aspirin  and Brilinta .  Per daughter at the bedside, she has not been taking aspirin , she takes only Brilinta .  -EKG shows atrial fibrillation with RVR.?  New A-fib - Will need to discuss risk and benefit of anticoagulation with her age Check TSH   Acute febrile illness Patient was noted with a temperature of 100.5 F No known source of infectious process at this time Urinalysis is still pending, notified RN to send sample if it has not been sent already.  Vancomycin  and Zosyn was given on admit.  Will continue.  Elevated white count 17,000 noted. Blood culture pending.  Type 2 diabetes mellitus with peripheral neuropathy  Continue ISS and hypoglycemia protocol   CAD (coronary artery disease) of artery bypass graft Continue statin, Toprol -XL after speech therapist's clearance Resume aspirin     Mixed hyperlipidemia Continue statin therapy after clearance from speech therapist   Depression Continue Zoloft  after clearance from speech therapist  Hypokalemia/hypomagnesemia: Will replace IV Start IV hydration with LR.     Advance Care Planning: DNR   Consults: Neurology   Family Communication: Son and daughter at bedside (all questions answered to satisfaction)  Severity of Illness: The appropriate patient status for this patient is OBSERVATION. Observation status is judged to be reasonable and necessary in order to provide the required intensity of service  to ensure the patient's safety. The patient's presenting symptoms, physical exam findings, and initial radiographic and laboratory data in the context of their medical condition is felt to place them at decreased risk for further clinical deterioration. Furthermore, it is anticipated that the patient will be medically stable for discharge from the hospital within 2 midnights of admission.    Subjective:  Patient seen and examined at the bedside.  She is alert, follows commands.  Tries to speak but incomprehensible words.  Not back to baseline yet. Objective: Vitals:   10/23/24 2317 10/24/24 0112 10/24/24 0407 10/24/24 0800  BP: (!) 167/109 (!) 170/104 (!) 156/95 128/70  Pulse: (!) 107 (!) 109 (!) 106 90  Resp: 20 20 18 18   Temp: 99.4 F (37.4 C) 99.8 F (37.7 C) 99.4 F (37.4 C) 99.6 F (37.6 C)  TempSrc: Oral Oral Oral Axillary  SpO2: 92% 95% 96% 97%  Weight:      Height:        Intake/Output Summary (Last 24 hours) at 10/24/2024 9177 Last data filed at 10/24/2024 9188 Gross per 24 hour  Intake 0 ml  Output --  Net 0 ml   Filed Weights   10/23/24 1806  Weight: 66.7 kg    Examination:  General: Alert, confused, follows simple commands Chest: Clear bilaterally CVS: S1, S2, no murmur, irregular rhythm Abdomen: Soft, nontender Extremities: No edema Neuro: Follows commands, moves both extremities equally.   Data Reviewed: I have personally reviewed following labs and imaging studies  CBC: Recent Labs  Lab 10/23/24 1805 10/24/24 0347  WBC 9.1 17.6*  NEUTROABS 6.7  --   HGB 13.3 13.5  HCT 38.6 39.5  MCV 88.5 87.4  PLT 243 259   Basic Metabolic Panel: Recent Labs  Lab 10/23/24 1805 10/24/24 0347  NA 141 137  K 4.0 3.0*  CL 103 97*  CO2 22 16*  GLUCOSE 202* 326*  BUN 15 14  CREATININE 0.66 0.78  CALCIUM  9.8 9.3  MG  --  1.5*  PHOS  --  3.0   GFR: Estimated Creatinine Clearance: 42.7 mL/min (by C-G formula based on SCr of 0.78 mg/dL). Liver Function  Tests: Recent Labs  Lab 10/23/24 1805 10/24/24 0347  AST 18 19  ALT 9 9  ALKPHOS 90 93  BILITOT 0.7 0.7  PROT 6.9 7.1  ALBUMIN 4.1 4.3   No results for input(s): LIPASE, AMYLASE in the last 168 hours. No results for input(s): AMMONIA in the last 168 hours. Coagulation Profile: Recent Labs  Lab 10/23/24 1805  INR 1.0   Cardiac Enzymes: No results for input(s): CKTOTAL, CKMB, CKMBINDEX, TROPONINI in the last 168 hours. BNP (last 3 results) No results for input(s): PROBNP in the last 8760 hours. HbA1C: Recent Labs    10/23/24 1812  HGBA1C 7.4*   CBG: No results for input(s): GLUCAP in the last 168 hours. Lipid Profile: Recent Labs    10/24/24 0347  CHOL 167  HDL 70  LDLCALC 78  TRIG 96  CHOLHDL 2.4   Thyroid  Function Tests: No results for input(s): TSH, T4TOTAL, FREET4, T3FREE, THYROIDAB in the last 72 hours. Anemia Panel: No results for input(s): VITAMINB12, FOLATE, FERRITIN, TIBC, IRON, RETICCTPCT in the last 72 hours. Sepsis Labs: No results for input(s): PROCALCITON, LATICACIDVEN in the last 168 hours.  Recent Results (  from the past 240 hours)  Culture, blood (Routine X 2) w Reflex to ID Panel     Status: None (Preliminary result)   Collection Time: 10/23/24 10:52 PM   Specimen: BLOOD RIGHT HAND  Result Value Ref Range Status   Specimen Description BLOOD RIGHT HAND  Final   Special Requests   Final    BOTTLES DRAWN AEROBIC AND ANAEROBIC Blood Culture adequate volume   Culture   Final    NO GROWTH < 12 HOURS Performed at [facility], [address] [place], [place] [address]    Report Status PENDING  Incomplete  Culture, blood (Routine X 2) w Reflex to ID Panel     Status: None (Preliminary result)   Collection Time: 10/23/24 10:53 PM   Specimen: BLOOD LEFT HAND  Result Value Ref Range Status   Specimen Description BLOOD LEFT HAND  Final   Special Requests   Final    BOTTLES DRAWN AEROBIC AND ANAEROBIC  Blood Culture adequate volume   Culture   Final    NO GROWTH < 12 HOURS Performed at [facility], [address], [place], [place] [address]    Report Status PENDING  Incomplete         Radiology Studies: CT ANGIO HEAD NECK W WO CM (CODE STROKE) Result Date: 10/23/2024 CLINICAL DATA:  Initial evaluation for acute neuro deficit, stroke suspected. Right-sided deficits. EXAM: CT ANGIOGRAPHY HEAD AND NECK WITH AND WITHOUT CONTRAST TECHNIQUE: Multidetector CT imaging of the head and neck was performed using the standard protocol during bolus administration of intravenous contrast. Multiplanar CT image reconstructions and MIPs were obtained to evaluate the vascular anatomy. Carotid stenosis measurements (when applicable) are obtained utilizing NASCET criteria, using the distal internal carotid diameter as the denominator. RADIATION DOSE REDUCTION: This exam was performed according to the departmental dose-optimization program which includes automated exposure control, adjustment of the mA and/or kV according to patient size and/or use of iterative reconstruction technique. CONTRAST:  60mL OMNIPAQUE  IOHEXOL  350 MG/ML SOLN COMPARISON:  Comparison made with head CT performed earlier the same day. FINDINGS: CTA NECK FINDINGS Aortic arch: Visualized aortic arch within normal limits for caliber. Origin of the great vessels incompletely visualized on this exam. Aortic atherosclerosis. No visible stenosis about the origin the great vessels. Right carotid system: Right common and internal carotid arteries are patent without dissection. Eccentric calcified plaque about the right carotid bulb without hemodynamically significant stenosis. Left carotid system: Left common and internal carotid arteries are patent without dissection. Scattered calcified plaque about the left carotid bulb/proximal left ICA with up to 60% stenosis by NASCET criteria. Vertebral arteries: Left vertebral artery dominant. Vertebral arteries are  patent without stenosis or dissection. Skeleton: No worrisome osseous lesions. Advanced multilevel cervical spondylosis noted. Other neck: No other acute finding. Upper chest: No other acute finding. Review of the MIP images confirms the above findings CTA HEAD FINDINGS Anterior circulation: Atheromatous change about the carotid siphons without hemodynamically significant stenosis. A1 segments patent bilaterally. Normal anterior communicating artery complex. Anterior cerebral arteries patent without significant stenosis. No M1 stenosis or occlusion. No proximal MCA branch occlusion or high-grade stenosis. Distal MCA branches perfused and symmetric. Posterior circulation: Both V4 segments patent without significant stenosis. Left PICA patent. Right PICA origin not well seen. Basilar patent without stenosis. Superior cerebellar arteries patent bilaterally. Left PCA supplied via the basilar. Right PCA supplied via a hypoplastic right P1 segment and prominent right posterior communicating artery. Both PCAs patent to their distal aspects without stenosis.  Venous sinuses: Grossly patent allowing for timing the contrast bolus. Anatomic variants: As above.  No aneurysm. Review of the MIP images confirms the above findings IMPRESSION: 1. Negative CTA for large vessel occlusion or other emergent finding. 2. Atheromatous change about the left carotid bulb/proximal left ICA with up to 60% stenosis by NASCET criteria. 3. Additional mild atheromatous change about the right carotid bulb and carotid siphons without hemodynamically significant stenosis. Aortic Atherosclerosis (ICD10-I70.0). These results were communicated to Dr. Germaine at 7:03 pm on 10/23/2024 by text page via the [facility] messaging system. Electronically Signed   By: Morene Hoard M.D.   On: 10/23/2024 19:18   CT HEAD CODE STROKE WO CONTRAST (LKW 0-4.5h, LVO 0-24h) Result Date: 10/23/2024 EXAM: CT HEAD WITHOUT 10/23/2024 06:14:00 PM TECHNIQUE: CT of the  head was performed without the administration of intravenous contrast. Automated exposure control, iterative reconstruction, and/or weight based adjustment of the mA/kV was utilized to reduce the radiation dose to as low as reasonably achievable. COMPARISON: None available. CLINICAL HISTORY: Neuro deficit, acute, stroke suspected. FINDINGS: BRAIN AND VENTRICLES: No acute intracranial hemorrhage. No mass effect or midline shift. No extra-axial fluid collection. No evidence of acute infarct. No hydrocephalus. Generalized parenchymal volume loss and chronic small vessel ischemic changes. [place] Stroke Program Early CT Score (ASPECTS) Ganglionic (caudate, IC, lentiform nucleus, insula, M1-M3): 7 Supraganglionic (M4-M6): 3 Total: 10 ORBITS: No acute abnormality. SINUSES AND MASTOIDS: No acute abnormality. SOFT TISSUES AND SKULL: No acute skull fracture. No acute soft tissue abnormality. IMPRESSION: 1. No acute intracranial abnormality. 2. Generalized parenchymal volume loss and chronic small vessel ischemic changes. 3. ASPECTS: 10. Dr. Germaine paged at 6:18 PM Electronically signed by: Franky Stanford MD 10/23/2024 06:19 PM EDT RP Workstation: [id number]        Scheduled Meds:   stroke: early stages of recovery book   Does not apply Once   aspirin  EC  81 mg Oral Daily   haloperidol  lactate  1 mg Intravenous Once   metoprolol  succinate  50 mg Oral QHS   Trospium  Chloride  1 capsule Oral Daily   Continuous Infusions:  sodium chloride  40 mL/hr at 10/23/24 2218   lactated ringers      magnesium  sulfate bolus IVPB     piperacillin-tazobactam (ZOSYN)  IV 3.375 g (10/24/24 0543)   potassium chloride      vancomycin             Derryl Duval, MD [facility] 10/24/2024, 8:22 AM

## 2024-10-24 NOTE — Progress Notes (Signed)
 RN was unable to complete the admission process and portions of the assessment due to the patient's confusion and non-verbal status. No family members were present at the bedside during admission. The patient demonstrated greater movement in the left extremities compared to the right but did not follow commands or verbalize. Will continue to perform NIH and neurological assessments as ordered.

## 2024-10-24 NOTE — Progress Notes (Signed)
 Tele called patient had a 13 beat run of PSVT. Noted patient lying in bed, no s/s of pain or discomfort. MD Sigdel made aware.

## 2024-10-24 NOTE — Progress Notes (Addendum)
 Patients husband reported patient in pain. Noted patients right arm swollen at IV site, IV at right [facility] had infiltrated. Stopped patients IV fluids, applied ice to affected area and removed patients IV. Noted patient having discomfort. MD Sigdel made aware. New orders placed. Brandie RN placed new IV and Dempsey [facility] made aware of infiltration. Patients spouse at bedside informed as well. Applied ice and heat during shift.

## 2024-10-24 NOTE — Plan of Care (Signed)
  Problem: Acute Rehab PT Goals(only PT should resolve) Goal: Pt will Roll Supine to Side Outcome: Progressing Flowsheets (Taken 10/24/2024 1117) Pt will Roll Supine to Side: with mod assist Goal: Pt Will Go Supine/Side To Sit Outcome: Progressing Flowsheets (Taken 10/24/2024 1117) Pt will go Supine/Side to Sit: with moderate assist Goal: Patient Will Perform Sitting Balance Outcome: Progressing Flowsheets (Taken 10/24/2024 1117) Patient will perform sitting balance: with moderate assist Goal: Pt Will Transfer Bed To Chair/Chair To Bed Outcome: Progressing Flowsheets (Taken 10/24/2024 1117) Pt will Transfer Bed to Chair/Chair to Bed: with max assist

## 2024-10-24 NOTE — Plan of Care (Signed)
  Problem: Education: Goal: Knowledge of General Education information will improve Description: Including pain rating scale, medication(s)/side effects and non-pharmacologic comfort measures Outcome: Not Progressing   Problem: Health Behavior/Discharge Planning: Goal: Ability to manage health-related needs will improve Outcome: Not Progressing   Problem: Clinical Measurements: Goal: Ability to maintain clinical measurements within normal limits will improve Outcome: Progressing Goal: Will remain free from infection Outcome: Progressing Goal: Diagnostic test results will improve Outcome: Progressing Goal: Respiratory complications will improve Outcome: Progressing Goal: Cardiovascular complication will be avoided Outcome: Progressing   Problem: Activity: Goal: Risk for activity intolerance will decrease Outcome: Not Progressing   Problem: Pain Managment: Goal: General experience of comfort will improve and/or be controlled Outcome: Progressing   Problem: Safety: Goal: Ability to remain free from injury will improve Outcome: Progressing   Problem: Skin Integrity: Goal: Risk for impaired skin integrity will decrease Outcome: Progressing   Problem: Education: Goal: Knowledge of disease or condition will improve Outcome: Not Progressing Goal: Knowledge of secondary prevention will improve (MUST DOCUMENT ALL) Outcome: Not Progressing Goal: Knowledge of patient specific risk factors will improve (DELETE if not current risk factor) Outcome: Not Progressing   Problem: Health Behavior/Discharge Planning: Goal: Ability to manage health-related needs will improve Outcome: Progressing Goal: Goals will be collaboratively established with patient/family Outcome: Progressing   Problem: Coping: Goal: Will verbalize positive feelings about self Outcome: Not Progressing Goal: Will identify appropriate support needs Outcome: Not Progressing   Problem: Nutrition: Goal: Risk of  aspiration will decrease Outcome: Not Progressing Goal: Dietary intake will improve Outcome: Not Progressing   Problem: Self-Care: Goal: Ability to participate in self-care as condition permits will improve Outcome: Not Progressing Goal: Verbalization of feelings and concerns over difficulty with self-care will improve Outcome: Not Progressing Goal: Ability to communicate needs accurately will improve Outcome: Not Progressing

## 2024-10-24 NOTE — Progress Notes (Signed)
 Pharmacy Antibiotic Note  [name] is a 89 y.o. female admitted on 10/23/2024 with fever of unknown origin.  Pharmacy has been consulted for Vancomycin  dosing. WBC is WNL. Renal function appears ok.   Plan: Vancomycin  1000 mg IV q24h >>>Estimated AUC: 522 Zosyn per MD Trend WBC, temp, renal function  F/U infectious work-up Drug levels as indicated   Height: 5' 7 (170.2 cm) Weight: 66.7 kg (147 lb) IBW/kg (Calculated) : 61.6  Temp (24hrs), Avg:99.3 F (37.4 C), Min:97.3 F (36.3 C), Max:100.5 F (38.1 C)  Recent Labs  Lab 10/23/24 1805  WBC 9.1  CREATININE 0.66    Estimated Creatinine Clearance: 42.7 mL/min (by C-G formula based on SCr of 0.66 mg/dL).    Allergies  Allergen Reactions   Codeine Nausea And Vomiting   Glipizide      Hypoglycemia with glucoses recorded in the 20s necessitating glucagon and D10 IV   Sulfa  Antibiotics Other (See Comments)    Unspecified    Lynwood Mckusick, PharmD, BCPS Clinical Pharmacist Phone: [phone]

## 2024-10-24 NOTE — TOC Initial Note (Addendum)
 Transition of Care [facility]) - Initial/Assessment Note    Patient Details  Name: [name] MRN: [id number] Date of Birth: 01-10-1929  [facility]) CM/SW Contact:    Sharlyne Stabs, RN Phone Number: 10/24/2024, 3:31 PM  Clinical Narrative:      Patient admitted with acute right side weakness. CM at the bedside, spoke with her spouse. They have been married 70 years. He pays for a aide to come in every day to assist. He would consider home health if needed, he does not want her to go back to SNF. She went to [place] in July. He will care for her in the home.  Patient is non ambulatory, he gets her up in a wheelchair everyday. IPCM following.              Expected Discharge Plan: Home/Self Care Barriers to Discharge: Continued Medical Work up   Patient Goals and CMS Choice Patient states their goals for this hospitalization and ongoing recovery are:: return home CMS Medicare.gov Compare Post Acute Care list provided to:: Patient Represenative (must comment) Choice offered to / list presented to : Spouse  ownership interest in [facility].provided to:: Spouse    Expected Discharge Plan and Services       Living arrangements for the past 2 months: Single Family Home                     Prior Living Arrangements/Services Living arrangements for the past 2 months: Single Family Home Lives with:: Spouse Patient language and need for interpreter reviewed:: Yes        Need for Family Participation in Patient Care: Yes (Comment) Care giver support system in place?: Yes (comment) Current home services: Homehealth aide Criminal Activity/Legal Involvement Pertinent to Current Situation/Hospitalization: No - Comment as needed  Activities of Daily Living   ADL Screening (condition at time of admission) Independently performs ADLs?: No Does the patient have a NEW difficulty with bathing/dressing/toileting/self-feeding that is expected to last >3  days?: Yes (Initiates electronic notice to provider for possible OT consult) Does the patient have a NEW difficulty with getting in/out of bed, walking, or climbing stairs that is expected to last >3 days?: Yes (Initiates electronic notice to provider for possible PT consult) Does the patient have a NEW difficulty with communication that is expected to last >3 days?: Yes (Initiates electronic notice to provider for possible SLP consult) Does the patient have difficulty concentrating, remembering, or making decisions?: Yes  Permission Sought/Granted            Permission granted to share info w Relationship: Spouse     Emotional Assessment     Affect (typically observed): Unable to Assess   Alcohol / Substance Use: Not Applicable Psych Involvement: No (comment)  Admission diagnosis:  Acute right-sided weakness [R53.1] Patient Active Problem List   Diagnosis Date Noted   Acute right-sided weakness 10/23/2024   Altered mental status 07/11/2023   Acute lower UTI 07/10/2023   Dyslipidemia 07/10/2023   Type 2 diabetes mellitus with peripheral neuropathy (HCC) 07/10/2023   Hypoalbuminemia due to protein-calorie malnutrition 07/05/2023   Impaction of colon (HCC) 06/28/2023   DNR (do not resuscitate) 06/28/2023   Dehydration 05/05/2023   Hypoglycemia 02/10/2023   Chest pain 02/10/2023   Depression 02/10/2023   CAD (coronary artery disease) of artery bypass graft 01/04/2023   Pressure injury of skin 01/04/2023   Physical debility 01/04/2023   Leukocytosis 01/04/2023   Acute ST elevation  myocardial infarction (STEMI) of inferior wall (HCC) 12/31/2022   SARS-CoV-2 positive 08/24/2022   SARS-CoV-2 antibody positive 08/23/2022   Emphysema of lung (HCC) 08/21/2022   Vascular dementia without behavioral disturbance (HCC) 08/21/2022   Decubitus ulcer of buttock, stage 2 (HCC) 08/13/2022   Hypomagnesemia 08/03/2022   Hypoglycemia associated with diabetes (HCC) 08/02/2022   Hypokalemia  08/02/2022   Uncontrolled type 2 diabetes mellitus with hyperglycemia, without long-term current use of insulin  (HCC) 07/19/2022   Diabetic neuropathy associated with type 2 diabetes mellitus (HCC) 07/16/2022   Hypertension associated with type 2 diabetes mellitus (HCC) 07/16/2022   Hyperlipidemia associated with type 2 diabetes mellitus (HCC) 07/16/2022   Chronic insomnia 07/16/2022   Bilateral lower extremity edema 07/16/2022   Chronic constipation 07/16/2022   Aortic atherosclerosis 07/16/2022   Major neurocognitive disorder (HCC) 07/16/2022   DJD (degenerative joint disease) of knee 07/14/2022   UTI (urinary tract infection) 07/09/2022   Late effects of cerebrovascular disease 09/25/2021   Acute metabolic encephalopathy 07/14/2019   Cellulitis of buttock 07/14/2019   Hyperlipidemia 02/20/2016   Essential hypertension 10/10/2015   Controlled type 2 diabetes with neuropathy (HCC) 10/01/2008   PCP:  Marvine Rush, MD Pharmacy:   [facility] - [place], [place] - [address] [place] [address] Phone: [phone] Fax: [phone]     Social Drivers of Health (SDOH) Social History: SDOH Screenings   Food Insecurity: Patient Unable To Answer (10/24/2024)  Housing: Patient Unable To Answer (10/24/2024)  Transportation Needs: Patient Unable To Answer (10/24/2024)  Utilities: Patient Unable To Answer (10/24/2024)  Physical Activity: Inactive (02/08/2019)  Social Connections: Unknown (10/24/2024)  Stress: No Stress Concern Present (02/08/2019)  Tobacco Use: Low Risk  (10/23/2024)   SDOH Interventions:     Readmission Risk Interventions    07/11/2023    3:31 PM 07/06/2023    3:49 PM 02/12/2023   11:26 AM  Readmission Risk Prevention Plan  Transportation Screening Complete Complete Complete  PCP or Specialist Appt within 3-5 Days  Complete   HRI or Home Care Consult  Complete Complete  Social Work Consult for Recovery Care Planning/Counseling  Complete  Complete  Palliative Care Screening  Not Applicable Not Applicable  Medication Review Oceanographer) Complete Complete Complete  PCP or Specialist appointment within 3-5 days of discharge Complete    HRI or Home Care Consult Complete    SW Recovery Care/Counseling Consult Complete    Palliative Care Screening Not Complete    Skilled Nursing Facility Complete

## 2024-10-24 NOTE — Progress Notes (Signed)
*  PRELIMINARY RESULTS* Echocardiogram 2D Echocardiogram has been performed.  [name] 10/24/2024, 10:07 AM

## 2024-10-25 DIAGNOSIS — R531 Weakness: Secondary | ICD-10-CM | POA: Diagnosis not present

## 2024-10-25 LAB — CBC WITH DIFFERENTIAL/PLATELET
Abs Immature Granulocytes: 0.04 K/uL (ref 0.00–0.07)
Basophils Absolute: 0.1 K/uL (ref 0.0–0.1)
Basophils Relative: 0 %
Eosinophils Absolute: 0.1 K/uL (ref 0.0–0.5)
Eosinophils Relative: 1 %
HCT: 33.4 % — ABNORMAL LOW (ref 36.0–46.0)
Hemoglobin: 11.6 g/dL — ABNORMAL LOW (ref 12.0–15.0)
Immature Granulocytes: 0 %
Lymphocytes Relative: 10 %
Lymphs Abs: 1.2 K/uL (ref 0.7–4.0)
MCH: 30.9 pg (ref 26.0–34.0)
MCHC: 34.7 g/dL (ref 30.0–36.0)
MCV: 89.1 fL (ref 80.0–100.0)
Monocytes Absolute: 1.1 K/uL — ABNORMAL HIGH (ref 0.1–1.0)
Monocytes Relative: 9 %
Neutro Abs: 9.7 K/uL — ABNORMAL HIGH (ref 1.7–7.7)
Neutrophils Relative %: 80 %
Platelets: 207 K/uL (ref 150–400)
RBC: 3.75 MIL/uL — ABNORMAL LOW (ref 3.87–5.11)
RDW: 13.7 % (ref 11.5–15.5)
WBC: 12.1 K/uL — ABNORMAL HIGH (ref 4.0–10.5)
nRBC: 0 % (ref 0.0–0.2)

## 2024-10-25 LAB — BASIC METABOLIC PANEL WITH GFR
Anion gap: 11 (ref 5–15)
BUN: 20 mg/dL (ref 8–23)
CO2: 22 mmol/L (ref 22–32)
Calcium: 8.8 mg/dL — ABNORMAL LOW (ref 8.9–10.3)
Chloride: 104 mmol/L (ref 98–111)
Creatinine, Ser: 0.8 mg/dL (ref 0.44–1.00)
GFR, Estimated: >60 mL/min (ref >60)
Glucose, Bld: 164 mg/dL — ABNORMAL HIGH (ref 70–99)
Potassium: 3.1 mmol/L — ABNORMAL LOW (ref 3.5–5.1)
Sodium: 137 mmol/L (ref 135–145)

## 2024-10-25 LAB — RESP PANEL BY RT-PCR (RSV, FLU A&B, COVID)  RVPGX2
Influenza A by PCR: NEGATIVE
Influenza B by PCR: NEGATIVE
Resp Syncytial Virus by PCR: NEGATIVE
SARS Coronavirus 2 by RT PCR: NEGATIVE

## 2024-10-25 LAB — MAGNESIUM: Magnesium: 1.8 mg/dL (ref 1.7–2.4)

## 2024-10-25 LAB — CK: Total CK: 425 U/L — ABNORMAL HIGH (ref 38–234)

## 2024-10-25 MED ORDER — POTASSIUM CHLORIDE 10 MEQ/100ML IV SOLN
10.0000 meq | INTRAVENOUS | Status: AC
  Administered 2024-10-25 (×4): 10 meq via INTRAVENOUS
  Filled 2024-10-25 (×4): qty 100

## 2024-10-25 MED ORDER — LOSARTAN POTASSIUM 25 MG PO TABS
25.0000 mg | ORAL_TABLET | Freq: Every day | ORAL | Status: DC
  Administered 2024-10-25 – 2024-10-26 (×2): 25 mg via ORAL
  Filled 2024-10-25 (×2): qty 1

## 2024-10-25 MED ORDER — HYDRALAZINE HCL 20 MG/ML IJ SOLN
10.0000 mg | Freq: Four times a day (QID) | INTRAMUSCULAR | Status: DC | PRN
  Filled 2024-10-25: qty 1

## 2024-10-25 NOTE — Progress Notes (Addendum)
 PROGRESS NOTE    [name]  FMW:[id number] DOB: [birth year] DOA: 10/23/2024 PCP: Marvine Rush, MD   Brief Narrative:    [name] is a 89 y.o. female with medical history significant of hypertension, hyperlipidemia, T2DM, coronary artery disease status post STEMI 12/2022, prior stroke, dementia who presents to the emergency department from home via EMS for evaluation of acute right-sided weakness.  Patient was seen by her care giver around 10 AM this morning, she had breakfast and caregiver went to take care of other house chores, on returning to check on patient around 12:30 PM, she was not able to speak and the right side appears to be weak.  Son came in shortly after this and noted that patient was having difficulty in being able to communicate and same was confirmed by daughter who came in later in the afternoon.  EMS was activated and patient was sent to the ED for further evaluation and management.  Patient was incontinent, wheelchair-bound and requires assistance with ADLs at baseline.   ED Course In the emergency department, BP was elevated at 152/122, she was initially tachycardic, but this subsequently normalized.  Other vital signs are within normal range.  Workup in the ED showed normal CBC and BMP except for blood glucose of 202, alcohol level was undetectable.  Urine drug screen was negative.  Urinalysis pending. CT head without contrast showed no acute intracranial abnormality CT angiography of head and neck with and without contrast showed negative CT angiography for LVO or other emergent finding.  Atheromatous change about the left carotid bulb/proximal left ICA with up to 60% stenosis by NASCET criteria. Teleneurology was consulted and recommended admitting patient for further stroke workup.   Assessment & Plan:   Principal Problem:   Acute right-sided weakness   Assessment and Plan: Right-sided weakness, aphasia  CT head without contrast showed no  acute intracranial abnormality CT angiography of head and neck with and without contrast showed negative CT angiography for LVO  Stroke workup, MRI shows no acute findings although motion degraded exam. Echocardiogram: Normal left ventricular function, no wall motion abnormalities, no significant valvular abnormalities, no PFO Total cholesterol 167, LDL 78, HDL 70, hemoglobin A1c 7.4. Swallow evaluation, PT/OT  aspirin  and statin.  Per MAR review, patient has been on dual antiplatelet therapy with aspirin  and Brilinta .  Per daughter at the bedside, she has not been taking aspirin , she takes only Brilinta .  -EKG shows atrial fibrillation with RVR.?  New A-fib/?tachybrady - Will need to discuss risk and benefit of anticoagulation with her age TSH is normal. - Telemetry shows bradycardia in 40s to 50s.  Will hold metoprolol . - Losartan  added for hypertension  Acute febrile illness Patient was noted with a temperature of 100.5 F.  Fever has resolved.  White count has improved. No known source of infectious process at this time.  Will screen for COVID-19, influenza urinalysis shows positive hemoglobin but normal RBCs, WBCs 0.5, nitrite negative, mild leukocyte esterase.  Will follow culture.  Blood culture has been negative so far. Will continue vancomycin  and Zosyn for now.  Mild rhabdomyolysis: Hemoglobin positive in the urine dipstick.  CK was checked, elevated at 500.  This is after IV hydration.  Will continue to hydrate today.  Type 2 diabetes mellitus with peripheral neuropathy  Continue ISS and hypoglycemia protocol   CAD (coronary artery disease) of artery bypass graft Continue statin, Toprol -XL after speech therapist's clearance Resume aspirin     Mixed hyperlipidemia Continue statin therapy after clearance  from speech therapist   Depression Continue Zoloft  after clearance from speech therapist  Hypokalemia/hypomagnesemia: Will replace IV Start IV hydration with LR.      Advance Care Planning: DNR   Consults: Neurology   Family Communication: Son and daughter at bedside (all questions answered to satisfaction)   Severity of Illness: The appropriate patient status for this patient is OBSERVATION. Observation status is judged to be reasonable and necessary in order to provide the required intensity of service to ensure the patient's safety. The patient's presenting symptoms, physical exam findings, and initial radiographic and laboratory data in the context of their medical condition is felt to place them at decreased risk for further clinical deterioration. Furthermore, it is anticipated that the patient will be medically stable for discharge from the hospital within 2 midnights of admission.    Subjective:  Patient seen and examined at the bedside.  She is alert and conversant today.  Follows commands.  Afebrile.  Vital signs are stable vital   objective: Vitals:   10/24/24 2014 10/24/24 2257 10/25/24 0334 10/25/24 1250  BP: 129/71 (!) 141/57 (!) 155/69 (!) 188/54  Pulse: 66 61 (!) 54 60  Resp: 15  16   Temp: 97.7 F (36.5 C)  97.6 F (36.4 C) 97.9 F (36.6 C)  TempSrc: Oral  Oral Axillary  SpO2: 98%  97% 98%  Weight:      Height:        Intake/Output Summary (Last 24 hours) at 10/25/2024 1346 Last data filed at 10/25/2024 0700 Gross per 24 hour  Intake 1905.17 ml  Output 300 ml  Net 1605.17 ml   Filed Weights   10/23/24 1806  Weight: 66.7 kg    Examination:  General: Alert, conversant, follows simple commands Chest: Clear bilaterally CVS: S1, S2, no murmur, irregular rhythm Abdomen: Soft, nontender Extremities: No edema Neuro: Follows commands, moves both extremities equally.   Data Reviewed: I have personally reviewed following labs and imaging studies  CBC: Recent Labs  Lab 10/23/24 1805 10/23/24 1811 10/24/24 0347 10/25/24 0758  WBC 9.1  --  17.6* 12.1*  NEUTROABS 6.7  --   --  9.7*  HGB 13.3 13.3 13.5 11.6*  HCT  38.6 39.0 39.5 33.4*  MCV 88.5  --  87.4 89.1  PLT 243  --  259 207   Basic Metabolic Panel: Recent Labs  Lab 10/23/24 1805 10/23/24 1811 10/24/24 0347 10/25/24 0758  NA 141 140 137 137  K 4.0 3.8 3.0* 3.1*  CL 103 105 97* 104  CO2 22  --  16* 22  GLUCOSE 202* 200* 326* 164*  BUN 15 15 14 20   CREATININE 0.66 0.70 0.78 0.80  CALCIUM  9.8  --  9.3 8.8*  MG  --   --  1.5* 1.8  PHOS  --   --  3.0  --    GFR: Estimated Creatinine Clearance: 42.7 mL/min (by C-G formula based on SCr of 0.8 mg/dL). Liver Function Tests: Recent Labs  Lab 10/23/24 1805 10/24/24 0347  AST 18 19  ALT 9 9  ALKPHOS 90 93  BILITOT 0.7 0.7  PROT 6.9 7.1  ALBUMIN 4.1 4.3   No results for input(s): LIPASE, AMYLASE in the last 168 hours. No results for input(s): AMMONIA in the last 168 hours. Coagulation Profile: Recent Labs  Lab 10/23/24 1805  INR 1.0   Cardiac Enzymes: Recent Labs  Lab 10/25/24 0758  CKTOTAL 425*   BNP (last 3 results) No results for input(s): PROBNP in  the last 8760 hours. HbA1C: Recent Labs    10/23/24 1812  HGBA1C 7.4*   CBG: Recent Labs  Lab 10/24/24 1647  GLUCAP 202*   Lipid Profile: Recent Labs    10/24/24 0347  CHOL 167  HDL 70  LDLCALC 78  TRIG 96  CHOLHDL 2.4   Thyroid  Function Tests: No results for input(s): TSH, T4TOTAL, FREET4, T3FREE, THYROIDAB in the last 72 hours. Anemia Panel: No results for input(s): VITAMINB12, FOLATE, FERRITIN, TIBC, IRON, RETICCTPCT in the last 72 hours. Sepsis Labs: No results for input(s): PROCALCITON, LATICACIDVEN in the last 168 hours.  Recent Results (from the past 240 hours)  Culture, blood (Routine X 2) w Reflex to ID Panel     Status: None (Preliminary result)   Collection Time: 10/23/24 10:52 PM   Specimen: BLOOD RIGHT HAND  Result Value Ref Range Status   Specimen Description BLOOD RIGHT HAND  Final   Special Requests   Final    BOTTLES DRAWN AEROBIC AND ANAEROBIC  Blood Culture adequate volume   Culture   Final    NO GROWTH 2 DAYS Performed at [facility], [address], [place], [place] [address]    Report Status PENDING  Incomplete  Culture, blood (Routine X 2) w Reflex to ID Panel     Status: None (Preliminary result)   Collection Time: 10/23/24 10:53 PM   Specimen: BLOOD LEFT HAND  Result Value Ref Range Status   Specimen Description BLOOD LEFT HAND  Final   Special Requests   Final    BOTTLES DRAWN AEROBIC AND ANAEROBIC Blood Culture adequate volume   Culture   Final    NO GROWTH 2 DAYS Performed at [facility], [address] [place], [place] [address]    Report Status PENDING  Incomplete  Resp panel by RT-PCR (RSV, Flu A&B, Covid) Anterior Nasal Swab     Status: None   Collection Time: 10/25/24 10:43 AM   Specimen: Anterior Nasal Swab  Result Value Ref Range Status   SARS Coronavirus 2 by RT PCR NEGATIVE NEGATIVE Final    Comment: (NOTE) SARS-CoV-2 target nucleic acids are NOT DETECTED.  The SARS-CoV-2 RNA is generally detectable in upper respiratory specimens during the acute phase of infection. The lowest concentration of SARS-CoV-2 viral copies this assay can detect is 138 copies/mL. A negative result does not preclude SARS-Cov-2 infection and should not be used as the sole basis for treatment or other patient management decisions. A negative result may occur with  improper specimen collection/handling, submission of specimen other than nasopharyngeal swab, presence of viral mutation(s) within the areas targeted by this assay, and inadequate number of viral copies(<138 copies/mL). A negative result must be combined with clinical observations, patient history, and epidemiological information. The expected result is Negative.  Fact Sheet for Patients:  bloggercourse.com  Fact Sheet for Healthcare Providers:  seriousbroker.it  This test is no t yet approved or cleared by the  [place]  FDA and  has been authorized for detection and/or diagnosis of SARS-CoV-2 by FDA under an Emergency Use Authorization (EUA). This EUA will remain  in effect (meaning this test can be used) for the duration of the COVID-19 declaration under Section 564(b)(1) of the Act, 21 U.S.C.section 360bbb-3(b)(1), unless the authorization is terminated  or revoked sooner.       Influenza A by PCR NEGATIVE NEGATIVE Final   Influenza B by PCR NEGATIVE NEGATIVE Final    Comment: (NOTE) The Xpert Xpress SARS-CoV-2/FLU/RSV plus assay is intended as  an aid in the diagnosis of influenza from Nasopharyngeal swab specimens and should not be used as a sole basis for treatment. Nasal washings and aspirates are unacceptable for Xpert Xpress SARS-CoV-2/FLU/RSV testing.  Fact Sheet for Patients: bloggercourse.com  Fact Sheet for Healthcare Providers: seriousbroker.it  This test is not yet approved or cleared by the [place]  FDA and has been authorized for detection and/or diagnosis of SARS-CoV-2 by FDA under an Emergency Use Authorization (EUA). This EUA will remain in effect (meaning this test can be used) for the duration of the COVID-19 declaration under Section 564(b)(1) of the Act, 21 U.S.C. section 360bbb-3(b)(1), unless the authorization is terminated or revoked.     Resp Syncytial Virus by PCR NEGATIVE NEGATIVE Final    Comment: (NOTE) Fact Sheet for Patients: bloggercourse.com  Fact Sheet for Healthcare Providers: seriousbroker.it  This test is not yet approved or cleared by the [place]  FDA and has been authorized for detection and/or diagnosis of SARS-CoV-2 by FDA under an Emergency Use Authorization (EUA). This EUA will remain in effect (meaning this test can be used) for the duration of the COVID-19 declaration under Section 564(b)(1) of the Act, 21 U.S.C. section  360bbb-3(b)(1), unless the authorization is terminated or revoked.  Performed at [facility], [address] [place], [place] [address]          Radiology Studies: ECHOCARDIOGRAM COMPLETE Result Date: 10/24/2024    ECHOCARDIOGRAM REPORT   Patient Name:   [name] Date of Exam: 10/24/2024 Medical Rec #:  [id number]           Height:       67.0 in Accession #:    [id number]          Weight:       147.0 lb Date of Birth:  1929-02-10          BSA:          1.774 m Patient Age:    89 years            BP:           128/71 mmHg Patient Gender: F                   HR:           80 bpm. Exam Location:  [facility] Procedure: 2D Echo, Cardiac Doppler and Color Doppler (Both Spectral and Color            Flow Doppler were utilized during procedure). Indications:    Stroke l63.9  History:        Patient has prior history of Echocardiogram examinations, most                 recent 01/01/2023. CAD and Previous Myocardial Infarction, Stroke;                 Risk Factors:Hypertension, Diabetes and Dyslipidemia.  Sonographer:    Aida Pizza RCS Referring Phys: [id number] OLADAPO ADEFESO IMPRESSIONS  1. Left ventricular ejection fraction, by estimation, is 60 to 65%. The left ventricle has normal function. Left ventricular endocardial border not optimally defined to evaluate regional wall motion. There is mild left ventricular hypertrophy. Left ventricular diastolic parameters are consistent with Grade I diastolic dysfunction (impaired relaxation). Elevated left ventricular end-diastolic pressure.  2. Right ventricular systolic function is normal. The right ventricular size is normal. Tricuspid regurgitation signal is inadequate for assessing PA pressure.  3. The mitral valve is abnormal. No evidence of  mitral valve regurgitation. No evidence of mitral stenosis. Severe mitral annular calcification.  4. The aortic valve is tricuspid. Aortic valve regurgitation is not visualized. Aortic valve sclerosis/calcification  is present, without any evidence of aortic stenosis.  5. The inferior vena cava is normal in size with greater than 50% respiratory variability, suggesting right atrial pressure of 3 mmHg. Comparison(s): No significant change from prior study. FINDINGS  Left Ventricle: Left ventricular ejection fraction, by estimation, is 60 to 65%. The left ventricle has normal function. Left ventricular endocardial border not optimally defined to evaluate regional wall motion. Strain was performed and the global longitudinal strain is indeterminate. The left ventricular internal cavity size was normal in size. There is mild left ventricular hypertrophy. Left ventricular diastolic parameters are consistent with Grade I diastolic dysfunction (impaired relaxation).  Elevated left ventricular end-diastolic pressure. Right Ventricle: The right ventricular size is normal. No increase in right ventricular wall thickness. Right ventricular systolic function is normal. Tricuspid regurgitation signal is inadequate for assessing PA pressure. Left Atrium: Left atrial size was normal in size. Right Atrium: Right atrial size was normal in size. Pericardium: There is no evidence of pericardial effusion. Mitral Valve: The mitral valve is abnormal. Severe mitral annular calcification. No evidence of mitral valve regurgitation. No evidence of mitral valve stenosis. Tricuspid Valve: The tricuspid valve is normal in structure. Tricuspid valve regurgitation is not demonstrated. No evidence of tricuspid stenosis. Aortic Valve: The aortic valve is tricuspid. Aortic valve regurgitation is not visualized. Aortic valve sclerosis/calcification is present, without any evidence of aortic stenosis. Pulmonic Valve: The pulmonic valve was not well visualized. Pulmonic valve regurgitation is not visualized. No evidence of pulmonic stenosis. Aorta: The aortic root is normal in size and structure. Venous: The inferior vena cava is normal in size with greater than  50% respiratory variability, suggesting right atrial pressure of 3 mmHg. IAS/Shunts: The atrial septum is grossly normal. Additional Comments: 3D was performed not requiring image post processing on an independent workstation and was indeterminate.  LEFT VENTRICLE PLAX 2D LVIDd:         3.43 cm   Diastology LVIDs:         2.53 cm   LV e' medial:    3.85 cm/s LV PW:         0.90 cm   LV E/e' medial:  22.2 LV IVS:        1.30 cm   LV e' lateral:   6.84 cm/s LVOT diam:     1.90 cm   LV E/e' lateral: 12.5 LV SV:         61 LV SV Index:   35 LVOT Area:     2.84 cm  RIGHT VENTRICLE RV S prime:     10.60 cm/s TAPSE (M-mode): 1.9 cm LEFT ATRIUM           Index        RIGHT ATRIUM           Index LA diam:      2.90 cm 1.63 cm/m   RA Area:     11.30 cm LA Vol (A2C): 26.7 ml 15.05 ml/m  RA Volume:   24.80 ml  13.98 ml/m LA Vol (A4C): 46.6 ml 26.27 ml/m  AORTIC VALVE LVOT Vmax:   108.00 cm/s LVOT Vmean:  77.700 cm/s LVOT VTI:    0.216 m  AORTA Ao Root diam: 3.20 cm MITRAL VALVE MV Area (PHT): 2.95 cm     SHUNTS MV Decel Time: 257  msec     Systemic VTI:  0.22 m MV E velocity: 85.40 cm/s   Systemic Diam: 1.90 cm MV A velocity: 137.00 cm/s MV E/A ratio:  0.62 Vishnu Priya Mallipeddi Electronically signed by Diannah Late Mallipeddi Signature Date/Time: 10/24/2024/2:11:21 PM    Final    MR BRAIN WO CONTRAST Result Date: 10/24/2024 EXAM: MRI BRAIN WITHOUT CONTRAST 10/24/2024 01:17:10 PM TECHNIQUE: Multiplanar multisequence MRI of the head/brain was performed without the administration of intravenous contrast. COMPARISON: CT head and CTA head and neck from 10/23/2024 Brain MRI from 03/01/2020. CLINICAL HISTORY: 89 year old female with acute neuro deficit, stroke suspected, presenting yesterday. FINDINGS: BRAIN AND VENTRICLES: Intermittent motion artifact. No acute infarct. No intracranial hemorrhage. No mass. No midline shift. No hydrocephalus. Chronic cerebral volume loss, chronic periventricular white matter T2 and FLAIR  hyperintensity not significantly progressed since 2021. The sella is unremarkable. ORBITS: No acute abnormality. SINUSES AND MASTOIDS: Paranasal sinuses and mastoids well aerated. BONES AND SOFT TISSUES: Normal marrow signal. Motion degraded detail of the craniocervical junction and upper CERVICAL spine. IMPRESSION: 1. Intermittently motion degraded exam with No acute intracranial abnormality identified. Electronically signed by: Helayne Hurst MD 10/24/2024 01:30 PM EDT RP Workstation: [id number]   CT ANGIO HEAD NECK W WO CM (CODE STROKE) Result Date: 10/23/2024 CLINICAL DATA:  Initial evaluation for acute neuro deficit, stroke suspected. Right-sided deficits. EXAM: CT ANGIOGRAPHY HEAD AND NECK WITH AND WITHOUT CONTRAST TECHNIQUE: Multidetector CT imaging of the head and neck was performed using the standard protocol during bolus administration of intravenous contrast. Multiplanar CT image reconstructions and MIPs were obtained to evaluate the vascular anatomy. Carotid stenosis measurements (when applicable) are obtained utilizing NASCET criteria, using the distal internal carotid diameter as the denominator. RADIATION DOSE REDUCTION: This exam was performed according to the departmental dose-optimization program which includes automated exposure control, adjustment of the mA and/or kV according to patient size and/or use of iterative reconstruction technique. CONTRAST:  60mL OMNIPAQUE  IOHEXOL  350 MG/ML SOLN COMPARISON:  Comparison made with head CT performed earlier the same day. FINDINGS: CTA NECK FINDINGS Aortic arch: Visualized aortic arch within normal limits for caliber. Origin of the great vessels incompletely visualized on this exam. Aortic atherosclerosis. No visible stenosis about the origin the great vessels. Right carotid system: Right common and internal carotid arteries are patent without dissection. Eccentric calcified plaque about the right carotid bulb without hemodynamically significant stenosis.  Left carotid system: Left common and internal carotid arteries are patent without dissection. Scattered calcified plaque about the left carotid bulb/proximal left ICA with up to 60% stenosis by NASCET criteria. Vertebral arteries: Left vertebral artery dominant. Vertebral arteries are patent without stenosis or dissection. Skeleton: No worrisome osseous lesions. Advanced multilevel cervical spondylosis noted. Other neck: No other acute finding. Upper chest: No other acute finding. Review of the MIP images confirms the above findings CTA HEAD FINDINGS Anterior circulation: Atheromatous change about the carotid siphons without hemodynamically significant stenosis. A1 segments patent bilaterally. Normal anterior communicating artery complex. Anterior cerebral arteries patent without significant stenosis. No M1 stenosis or occlusion. No proximal MCA branch occlusion or high-grade stenosis. Distal MCA branches perfused and symmetric. Posterior circulation: Both V4 segments patent without significant stenosis. Left PICA patent. Right PICA origin not well seen. Basilar patent without stenosis. Superior cerebellar arteries patent bilaterally. Left PCA supplied via the basilar. Right PCA supplied via a hypoplastic right P1 segment and prominent right posterior communicating artery. Both PCAs patent to their distal aspects without stenosis. Venous sinuses: Grossly patent allowing for timing  the contrast bolus. Anatomic variants: As above.  No aneurysm. Review of the MIP images confirms the above findings IMPRESSION: 1. Negative CTA for large vessel occlusion or other emergent finding. 2. Atheromatous change about the left carotid bulb/proximal left ICA with up to 60% stenosis by NASCET criteria. 3. Additional mild atheromatous change about the right carotid bulb and carotid siphons without hemodynamically significant stenosis. Aortic Atherosclerosis (ICD10-I70.0). These results were communicated to Dr. Germaine at 7:03 pm on  10/23/2024 by text page via the [facility] messaging system. Electronically Signed   By: Morene Hoard M.D.   On: 10/23/2024 19:18   CT HEAD CODE STROKE WO CONTRAST (LKW 0-4.5h, LVO 0-24h) Result Date: 10/23/2024 EXAM: CT HEAD WITHOUT 10/23/2024 06:14:00 PM TECHNIQUE: CT of the head was performed without the administration of intravenous contrast. Automated exposure control, iterative reconstruction, and/or weight based adjustment of the mA/kV was utilized to reduce the radiation dose to as low as reasonably achievable. COMPARISON: None available. CLINICAL HISTORY: Neuro deficit, acute, stroke suspected. FINDINGS: BRAIN AND VENTRICLES: No acute intracranial hemorrhage. No mass effect or midline shift. No extra-axial fluid collection. No evidence of acute infarct. No hydrocephalus. Generalized parenchymal volume loss and chronic small vessel ischemic changes. [place] Stroke Program Early CT Score (ASPECTS) Ganglionic (caudate, IC, lentiform nucleus, insula, M1-M3): 7 Supraganglionic (M4-M6): 3 Total: 10 ORBITS: No acute abnormality. SINUSES AND MASTOIDS: No acute abnormality. SOFT TISSUES AND SKULL: No acute skull fracture. No acute soft tissue abnormality. IMPRESSION: 1. No acute intracranial abnormality. 2. Generalized parenchymal volume loss and chronic small vessel ischemic changes. 3. ASPECTS: 10. Dr. Germaine paged at 6:18 PM Electronically signed by: Franky Stanford MD 10/23/2024 06:19 PM EDT RP Workstation: [id number]        Scheduled Meds:  fesoterodine   4 mg Oral Daily   haloperidol  lactate  1 mg Intravenous Once   ketorolac   15 mg Intravenous Once   losartan   25 mg Oral Daily   sertraline   75 mg Oral Daily   ticagrelor   90 mg Oral BID   Continuous Infusions:  piperacillin-tazobactam (ZOSYN)  IV 3.375 g (10/25/24 0601)   vancomycin  1,000 mg (10/24/24 2306)          Derryl Duval, MD [facility] 10/25/2024, 1:46 PM

## 2024-10-25 NOTE — Evaluation (Signed)
 Speech Language Pathology Evaluation Patient Details Name: [name] MRN: [id number] DOB: 11-01-1929 Today's Date: 10/25/2024 Time: 8842-8789 SLP Time Calculation (min) (ACUTE ONLY): 13 min  Problem List:  Patient Active Problem List   Diagnosis Date Noted   Acute right-sided weakness 10/23/2024   Altered mental status 07/11/2023   Acute lower UTI 07/10/2023   Dyslipidemia 07/10/2023   Type 2 diabetes mellitus with peripheral neuropathy (HCC) 07/10/2023   Hypoalbuminemia due to protein-calorie malnutrition 07/05/2023   Impaction of colon (HCC) 06/28/2023   DNR (do not resuscitate) 06/28/2023   Dehydration 05/05/2023   Hypoglycemia 02/10/2023   Chest pain 02/10/2023   Depression 02/10/2023   CAD (coronary artery disease) of artery bypass graft 01/04/2023   Pressure injury of skin 01/04/2023   Physical debility 01/04/2023   Leukocytosis 01/04/2023   Acute ST elevation myocardial infarction (STEMI) of inferior wall (HCC) 12/31/2022   SARS-CoV-2 positive 08/24/2022   SARS-CoV-2 antibody positive 08/23/2022   Emphysema of lung (HCC) 08/21/2022   Vascular dementia without behavioral disturbance (HCC) 08/21/2022   Decubitus ulcer of buttock, stage 2 (HCC) 08/13/2022   Hypomagnesemia 08/03/2022   Hypoglycemia associated with diabetes (HCC) 08/02/2022   Hypokalemia 08/02/2022   Uncontrolled type 2 diabetes mellitus with hyperglycemia, without long-term current use of insulin  (HCC) 07/19/2022   Diabetic neuropathy associated with type 2 diabetes mellitus (HCC) 07/16/2022   Hypertension associated with type 2 diabetes mellitus (HCC) 07/16/2022   Hyperlipidemia associated with type 2 diabetes mellitus (HCC) 07/16/2022   Chronic insomnia 07/16/2022   Bilateral lower extremity edema 07/16/2022   Chronic constipation 07/16/2022   Aortic atherosclerosis 07/16/2022   Major neurocognitive disorder (HCC) 07/16/2022   DJD (degenerative joint disease) of knee 07/14/2022   UTI  (urinary tract infection) 07/09/2022   Late effects of cerebrovascular disease 09/25/2021   Acute metabolic encephalopathy 07/14/2019   Cellulitis of buttock 07/14/2019   Hyperlipidemia 02/20/2016   Essential hypertension 10/10/2015   Controlled type 2 diabetes with neuropathy (HCC) 10/01/2008   Past Medical History:  Past Medical History:  Diagnosis Date   CAD (coronary artery disease)    a. 12/2022 Inf STEMI/PCI: LM nl, LAD mild diff dzs, D1 50, D2 50., LCX 75d, OM1 60, RCA 90p (2.5x20 Synergy XD DES), 100d (PTCA-->90 residual).   Diabetes mellitus with neuropathy    Diabetic neuropathy (HCC)    Diastolic dysfunction    a. 12/2022 Echo: EF 60-65%, no rwma, GrI DD, nl RV size/fxn, triv MR.   Hypertension    Stroke [facility])    Urinary incontinence    Past Surgical History:  Past Surgical History:  Procedure Laterality Date   ABDOMINAL HYSTERECTOMY     APPENDECTOMY     BREAST SURGERY     begnin tumor removed   CATARACT EXTRACTION, BILATERAL     CORONARY ANGIOGRAPHY N/A 12/31/2022   Procedure: CORONARY ANGIOGRAPHY;  Surgeon: Mady Bruckner, MD;  Location: [facility];  Service: Cardiovascular;  Laterality: N/A;   CORONARY/GRAFT ACUTE MI REVASCULARIZATION N/A 12/31/2022   Procedure: Coronary/Graft Acute MI Revascularization;  Surgeon: Mady Bruckner, MD;  Location: [facility];  Service: Cardiovascular;  Laterality: N/A;   HPI:  Presented to ED 10/23/2024 d/t R sided weakness, non-verbal status. CT head and MRI both unremarkable for acute injury.   Assessment / Plan / Recommendation Clinical Impression  Pt presents with expressive and receptive language deficits, though improving per caregiver report. Pt's receptive language is c/b decreased comprehension, requiring repetition, modeling, and stressing of key words.  Even when stimuli understood, pt requires extra processing time to determine meaning. Impairments were evidenced in simple conversation, answering y/n  questions, and following directions. Expressive language is c/b reduced length of utterances, with impaired content (e.g. calling dtr her sister, but name being correct). Pt was able to name simple objects, complete automatic speech tasks, and repeat simple stimuli. Suspect that pt's deficits are impacted by overall current cognitive status in setting of acute illness. It is promising that she is demonstrating improvements from initial non-verbal status. Pt may benefit from [facility] referral if ongoing cognitive communication deficits persist at discharge.    SLP Assessment  SLP Visit Diagnosis: Dysphagia, oral phase (R13.11)     Assistance Recommended at Discharge     Functional Status Assessment Patient has had a recent decline in their functional status and demonstrates the ability to make significant improvements in function in a reasonable and predictable amount of time.  Frequency and Duration           SLP Evaluation Cognition  Overall Cognitive Status: Impaired/Different from baseline Arousal/Alertness: Awake/alert Orientation Level: Oriented to person;Disoriented to place;Disoriented to time;Disoriented to situation       Comprehension  Auditory Comprehension Overall Auditory Comprehension: Impaired Yes/No Questions: Impaired Complex Questions: 25-49% accurate Commands: Impaired One Step Basic Commands: 0-24% accurate (benefits from direct model for repair) Two Step Basic Commands: 0-24% accurate Conversation: Simple Other Conversation Comments: in conversation, pt with usual long delay and asking for repetition to aid in comprehension EffectiveTechniques: Extra processing time;Increased volume;Repetition;Stressing words;Visual/Gestural cues    Expression Expression Primary Mode of Expression: Verbal Verbal Expression Overall Verbal Expression: Impaired Initiation: No impairment Level of Generative/Spontaneous Verbalization: Phrase Repetition: Impaired Level of Impairment:  Phrase level Naming: No impairment Pragmatics: No impairment Non-Verbal Means of Communication: Not applicable   Oral / Motor  Oral Motor/Sensory Function Overall Oral Motor/Sensory Function: Within functional limits (limited d/t challenges with following directions)            Harlene LITTIE Ned 10/25/2024, 1:28 PM

## 2024-10-25 NOTE — Plan of Care (Signed)
   Problem: Education: Goal: Knowledge of General Education information will improve Description Including pain rating scale, medication(s)/side effects and non-pharmacologic comfort measures Outcome: Progressing

## 2024-10-25 NOTE — Evaluation (Signed)
 Clinical/Bedside Swallow Evaluation Patient Details  Name: [name] MRN: [id number] Date of Birth: 1929/04/17  Today's Date: 10/25/2024 Time: SLP Start Time (ACUTE ONLY): 1157 SLP Stop Time (ACUTE ONLY): 1210 SLP Time Calculation (min) (ACUTE ONLY): 13 min  Past Medical History:  Past Medical History:  Diagnosis Date   CAD (coronary artery disease)    a. 12/2022 Inf STEMI/PCI: LM nl, LAD mild diff dzs, D1 50, D2 50., LCX 75d, OM1 60, RCA 90p (2.5x20 Synergy XD DES), 100d (PTCA-->90 residual).   Diabetes mellitus with neuropathy    Diabetic neuropathy (HCC)    Diastolic dysfunction    a. 12/2022 Echo: EF 60-65%, no rwma, GrI DD, nl RV size/fxn, triv MR.   Hypertension    Stroke [facility])    Urinary incontinence    Past Surgical History:  Past Surgical History:  Procedure Laterality Date   ABDOMINAL HYSTERECTOMY     APPENDECTOMY     BREAST SURGERY     begnin tumor removed   CATARACT EXTRACTION, BILATERAL     CORONARY ANGIOGRAPHY N/A 12/31/2022   Procedure: CORONARY ANGIOGRAPHY;  Surgeon: Mady Bruckner, MD;  Location: [facility];  Service: Cardiovascular;  Laterality: N/A;   CORONARY/GRAFT ACUTE MI REVASCULARIZATION N/A 12/31/2022   Procedure: Coronary/Graft Acute MI Revascularization;  Surgeon: Mady Bruckner, MD;  Location: [facility];  Service: Cardiovascular;  Laterality: N/A;   HPI:  Presented to ED 10/23/2024 d/t R sided weakness, non-verbal status. CT head and MRI both unremarkable for acute injury.    Assessment / Plan / Recommendation  Clinical Impression  Pt presents with oropharyngeal swallow that is within gross functional limits. Impairments noted for prolonged mastication with soft solid and impaired coordination with mixed consistencies c/b a cough. Though mastication is significantly prolonged, pt does manage to clear bolus from oral cavity with no residue noted. Otherwise, PO trials were tolerated without overt s/sx aspiration. Pt is  suggested for dysphagia 1 solids and thin liquids with diet upgrade pending overall improvement in status to baseline diet of easy to chew regular solids. 1:1 assist is recommended for all PO intake. Please allow pt extra time to swallow and avoid giving liquid until oral cavity cleared of solid textures. ST to follow for tolerance vs diet upgrade. SLP Visit Diagnosis: Dysphagia, oral phase (R13.11)    Aspiration Risk  Mild aspiration risk    Diet Recommendation Dysphagia 1 (Puree);No mixed consistencies;Thin liquid    Liquid Administration via: Straw Medication Administration: Crushed with puree Supervision: Staff to assist with self feeding Compensations: Slow rate;Small sips/bites Postural Changes: Seated upright at 90 degrees;Remain upright for at least 30 minutes after po intake    Other  Recommendations Oral Care Recommendations: Oral care BID     Assistance Recommended at Discharge    Functional Status Assessment Patient has had a recent decline in their functional status and demonstrates the ability to make significant improvements in function in a reasonable and predictable amount of time.  Frequency and Duration min 1 x/week  2 weeks       Prognosis Prognosis for improved oropharyngeal function: Good Barriers to Reach Goals: Cognitive deficits      Swallow Study   General Date of Onset: 10/23/24 HPI: Presented to ED 10/23/2024 d/t R sided weakness, non-verbal status. CT head and MRI both unremarkable for acute injury. Type of Study: Bedside Swallow Evaluation Previous Swallow Assessment: none Diet Prior to this Study: Regular;Thin liquids (Level 0) Temperature Spikes Noted: No Respiratory Status: Room air  History of Recent Intubation: No Behavior/Cognition: Alert;Cooperative Oral Cavity Assessment: Within Functional Limits Oral Care Completed by SLP: Yes Oral Cavity - Dentition: Poor condition;Missing dentition Vision: Functional for self-feeding Self-Feeding  Abilities: Needs assist Patient Positioning: Upright in bed Baseline Vocal Quality: Low vocal intensity Volitional Cough: Cognitively unable to elicit Volitional Swallow: Unable to elicit    Oral/Motor/Sensory Function Overall Oral Motor/Sensory Function: Within functional limits (limited d/t challenges with following directions)   Ice Chips Ice chips: Within functional limits Presentation: Spoon   Thin Liquid Thin Liquid: Within functional limits Presentation: Spoon;Straw    Nectar Thick     Honey Thick     Puree Puree: Within functional limits Presentation: Spoon Other Comments: pt only accepted x1 trial   Solid     Solid: Impaired Presentation: Spoon Oral Phase Impairments: Impaired mastication Oral Phase Functional Implications: Prolonged oral transit;Impaired mastication Other Comments: cough when attempting to clear oral cavity with liquid wash, suggesting imapiarment in coordination of mixed consistancies.      Harlene LITTIE Ned 10/25/2024,1:17 PM

## 2024-10-26 ENCOUNTER — Inpatient Hospital Stay (HOSPITAL_COMMUNITY)

## 2024-10-26 DIAGNOSIS — I25119 Atherosclerotic heart disease of native coronary artery with unspecified angina pectoris: Secondary | ICD-10-CM | POA: Diagnosis not present

## 2024-10-26 DIAGNOSIS — R001 Bradycardia, unspecified: Secondary | ICD-10-CM

## 2024-10-26 DIAGNOSIS — R531 Weakness: Secondary | ICD-10-CM | POA: Diagnosis not present

## 2024-10-26 LAB — GLUCOSE, CAPILLARY: Glucose-Capillary: 171 mg/dL — ABNORMAL HIGH (ref 70–99)

## 2024-10-26 MED ORDER — TICAGRELOR 60 MG PO TABS
60.0000 mg | ORAL_TABLET | Freq: Two times a day (BID) | ORAL | Status: DC
  Filled 2024-10-26 (×2): qty 1

## 2024-10-26 MED ORDER — TICAGRELOR 60 MG PO TABS: 60.0000 mg | ORAL_TABLET | Freq: Two times a day (BID) | ORAL | 2 refills | Status: AC

## 2024-10-26 MED ORDER — LOSARTAN POTASSIUM 25 MG PO TABS: 25.0000 mg | ORAL_TABLET | Freq: Every day | ORAL | 0 refills | Status: AC

## 2024-10-26 MED ORDER — SIMVASTATIN 20 MG PO TABS: 20.0000 mg | ORAL_TABLET | Freq: Every day | ORAL | Status: DC

## 2024-10-26 NOTE — Consult Note (Signed)
 CARDIOLOGY CONSULTATION  Patient ID: [name]; [id number]; 1929/01/27   Admit date: 10/23/2024 Date of Consult: 10/26/2024  Primary Care Provider: Marvine Rush, MD Primary Cardiologist: Diannah SHAUNNA Maywood, MD  HISTORY OF PRESENT ILLNESS  [name] is a 89 y.o. female currently admitted to the hospital for evaluation of acute right sided weakness and aphasia.  Head CT showed no acute findings, head and neck CTA showed no large vessel occlusive disease with left carotid bulb/proximal ICA 60% stenosis.  Brain MRI was motion degraded but showed no obvious acute findings.  Cardiology consulted given initial concerns by ECG of rapid atrial fibrillation.  She was started on outpatient dose of Toprol -XL 50 mg daily, over the weekend noted to be bradycardic with heart rates as low as the 40s to 50s in sinus rhythm by telemetry.  Echocardiogram shows LVEF 60 to 65% with mild diastolic dysfunction, normal RV contraction, and sclerotic aortic valve without stenosis.  I reviewed her ECGs and telemetry, the initial tracings are consistent with lead motion artifact not atrial fibrillation or SVT.  She has had no objective evidence of atrial fibrillation by telemetry either.  Cardiac history includes inferior STEMI in January 2024 managed with DES to proximal RCA and PTCA of distal RCA.  No regular cardiology follow-up since then.  She has been on Brilinta  90 mg twice daily and aspirin .  ROS  Pertinent review in history of present illness.  No chest pain or palpitations.  Past Medical History:  Diagnosis Date   CAD (coronary artery disease)    a. 12/2022 Inf STEMI/PCI: LM nl, LAD mild diff dzs, D1 50, D2 50., LCX 75d, OM1 60, RCA 90p (2.5x20 Synergy XD DES), 100d (PTCA-->90 residual).   Diabetes mellitus with neuropathy    Diabetic neuropathy (HCC)    Diastolic dysfunction    a. 12/2022 Echo: EF 60-65%, no rwma, GrI DD, nl RV size/fxn, triv MR.   Hypertension    Stroke [facility])     Urinary incontinence     Past Surgical History:  Procedure Laterality Date   ABDOMINAL HYSTERECTOMY     APPENDECTOMY     BREAST SURGERY     begnin tumor removed   CATARACT EXTRACTION, BILATERAL     CORONARY ANGIOGRAPHY N/A 12/31/2022   Procedure: CORONARY ANGIOGRAPHY;  Surgeon: Mady Bruckner, MD;  Location: [facility] INVASIVE CV LAB;  Service: Cardiovascular;  Laterality: N/A;   CORONARY/GRAFT ACUTE MI REVASCULARIZATION N/A 12/31/2022   Procedure: Coronary/Graft Acute MI Revascularization;  Surgeon: Mady Bruckner, MD;  Location: [facility] INVASIVE CV LAB;  Service: Cardiovascular;  Laterality: N/A;     INPATIENT MEDICATIONS Scheduled Meds:  fesoterodine   4 mg Oral Daily   haloperidol  lactate  1 mg Intravenous Once   ketorolac   15 mg Intravenous Once   losartan   25 mg Oral Daily   sertraline   75 mg Oral Daily   ticagrelor   90 mg Oral BID   Continuous Infusions:  piperacillin-tazobactam (ZOSYN)  IV 12.5 mL/hr at 10/26/24 0916   vancomycin  Stopped (10/25/24 2048)   PRN Meds: acetaminophen  **OR** acetaminophen  (TYLENOL ) oral liquid 160 mg/5 mL **OR** acetaminophen , hydrALAZINE , senna-docusate  ALLERGIES Allergies  Allergen Reactions   Glipizide  Other (See Comments)    Hypoglycemia with glucoses recorded in the 20s necessitating glucagon and D10 IV   Codeine Nausea And Vomiting and Nausea Only   Sulfa  Antibiotics Other (See Comments)    Unknown      SOCIAL HISTORY  Social History   Tobacco Use   Smoking status:  Never   Smokeless tobacco: Never  Substance Use Topics   Alcohol use: No    FAMILY HISTORY   The patient's family history includes CAD in her mother; Cancer in her brother; Heart attack in her brother; Hypertension in her mother; Stroke in her father.  PHYSICAL EXAM & DATA  Vitals:   10/25/24 1400 10/25/24 1902 10/26/24 0530 10/26/24 0739  BP: (!) 156/59 (!) 115/57 (!) 152/61 (!) 159/68  Pulse: (!) 57 64 (!) 45 (!) 48  Resp:  16  10  Temp:  97.9 F (36.6 C) 97.7 F  (36.5 C) 97.6 F (36.4 C)  TempSrc:  Oral Oral Oral  SpO2:  97% 99% 98%  Weight:      Height:        Intake/Output Summary (Last 24 hours) at 10/26/2024 1059 Last data filed at 10/26/2024 0916 Gross per 24 hour  Intake 751.11 ml  Output 1000 ml  Net -248.89 ml   Filed Weights   10/23/24 1806  Weight: 66.7 kg   Body mass index is 23.02 kg/m.   Gen: Patient appears comfortable at rest. HEENT: Conjunctiva and lids normal, oropharynx clear. Neck: Supple, no elevated JVP or carotid bruits. Lungs: Clear to auscultation, nonlabored breathing at rest. Cardiac: Regular rate and rhythm, no S3, 1/6 systolic murmur. Abdomen: Soft, nontender, bowel sounds present. Extremities: No pitting edema, distal pulses 2+. Skin: Warm and dry. Musculoskeletal: Kyphosis.  Telemetry:  I personally reviewed telemetry which shows sinus rhythm.   LABORATORY DATA  Chemistry Recent Labs  Lab 10/23/24 1805 10/23/24 1811 10/24/24 0347 10/25/24 0758  NA 141 140 137 137  K 4.0 3.8 3.0* 3.1*  CL 103 105 97* 104  CO2 22  --  16* 22  GLUCOSE 202* 200* 326* 164*  BUN 15 15 14 20   CREATININE 0.66 0.70 0.78 0.80  CALCIUM  9.8  --  9.3 8.8*  GFRNONAA >60  --  >60 >60  ANIONGAP 16*  --  24* 11    Recent Labs  Lab 10/23/24 1805 10/24/24 0347  PROT 6.9 7.1  ALBUMIN 4.1 4.3  AST 18 19  ALT 9 9  ALKPHOS 90 93  BILITOT 0.7 0.7   Hematology Recent Labs  Lab 10/23/24 1805 10/23/24 1811 10/24/24 0347 10/25/24 0758  WBC 9.1  --  17.6* 12.1*  RBC 4.36  --  4.52 3.75*  HGB 13.3 13.3 13.5 11.6*  HCT 38.6 39.0 39.5 33.4*  MCV 88.5  --  87.4 89.1  MCH 30.5  --  29.9 30.9  MCHC 34.5  --  34.2 34.7  RDW 13.2  --  13.2 13.7  PLT 243  --  259 207   Lipid Panel     Component Value Date/Time   CHOL 167 10/24/2024 0347   CHOL 120 11/13/2017 1037   TRIG 96 10/24/2024 0347   HDL 70 10/24/2024 0347   HDL 45 11/13/2017 1037   CHOLHDL 2.4 10/24/2024 0347   VLDL 19 10/24/2024 0347   LDLCALC 78  10/24/2024 0347   LDLCALC 48 11/13/2017 1037   LABVLDL 27 11/13/2017 1037    RADIOLOGY/STUDIES ECHOCARDIOGRAM COMPLETE Result Date: 10/24/2024    ECHOCARDIOGRAM REPORT   Patient Name:   [name] Date of Exam: 10/24/2024 Medical Rec #:  [id number]           Height:       67.0 in Accession #:    [id number]          Weight:  147.0 lb Date of Birth:  September 09, 1929          BSA:          1.774 m Patient Age:    89 years            BP:           128/71 mmHg Patient Gender: F                   HR:           80 bpm. Exam Location:  [facility] Procedure: 2D Echo, Cardiac Doppler and Color Doppler (Both Spectral and Color            Flow Doppler were utilized during procedure). Indications:    Stroke l63.9  History:        Patient has prior history of Echocardiogram examinations, most                 recent 01/01/2023. CAD and Previous Myocardial Infarction, Stroke;                 Risk Factors:Hypertension, Diabetes and Dyslipidemia.  Sonographer:    Aida Pizza RCS Referring Phys: [id number] OLADAPO ADEFESO IMPRESSIONS  1. Left ventricular ejection fraction, by estimation, is 60 to 65%. The left ventricle has normal function. Left ventricular endocardial border not optimally defined to evaluate regional wall motion. There is mild left ventricular hypertrophy. Left ventricular diastolic parameters are consistent with Grade I diastolic dysfunction (impaired relaxation). Elevated left ventricular end-diastolic pressure.  2. Right ventricular systolic function is normal. The right ventricular size is normal. Tricuspid regurgitation signal is inadequate for assessing PA pressure.  3. The mitral valve is abnormal. No evidence of mitral valve regurgitation. No evidence of mitral stenosis. Severe mitral annular calcification.  4. The aortic valve is tricuspid. Aortic valve regurgitation is not visualized. Aortic valve sclerosis/calcification is present, without any evidence of aortic stenosis.  5. The inferior vena  cava is normal in size with greater than 50% respiratory variability, suggesting right atrial pressure of 3 mmHg. Comparison(s): No significant change from prior study. FINDINGS  Left Ventricle: Left ventricular ejection fraction, by estimation, is 60 to 65%. The left ventricle has normal function. Left ventricular endocardial border not optimally defined to evaluate regional wall motion. Strain was performed and the global longitudinal strain is indeterminate. The left ventricular internal cavity size was normal in size. There is mild left ventricular hypertrophy. Left ventricular diastolic parameters are consistent with Grade I diastolic dysfunction (impaired relaxation).  Elevated left ventricular end-diastolic pressure. Right Ventricle: The right ventricular size is normal. No increase in right ventricular wall thickness. Right ventricular systolic function is normal. Tricuspid regurgitation signal is inadequate for assessing PA pressure. Left Atrium: Left atrial size was normal in size. Right Atrium: Right atrial size was normal in size. Pericardium: There is no evidence of pericardial effusion. Mitral Valve: The mitral valve is abnormal. Severe mitral annular calcification. No evidence of mitral valve regurgitation. No evidence of mitral valve stenosis. Tricuspid Valve: The tricuspid valve is normal in structure. Tricuspid valve regurgitation is not demonstrated. No evidence of tricuspid stenosis. Aortic Valve: The aortic valve is tricuspid. Aortic valve regurgitation is not visualized. Aortic valve sclerosis/calcification is present, without any evidence of aortic stenosis. Pulmonic Valve: The pulmonic valve was not well visualized. Pulmonic valve regurgitation is not visualized. No evidence of pulmonic stenosis. Aorta: The aortic root is normal in size and structure. Venous: The inferior vena cava is normal  in size with greater than 50% respiratory variability, suggesting right atrial pressure of 3 mmHg.  IAS/Shunts: The atrial septum is grossly normal. Additional Comments: 3D was performed not requiring image post processing on an independent workstation and was indeterminate.  LEFT VENTRICLE PLAX 2D LVIDd:         3.43 cm   Diastology LVIDs:         2.53 cm   LV e' medial:    3.85 cm/s LV PW:         0.90 cm   LV E/e' medial:  22.2 LV IVS:        1.30 cm   LV e' lateral:   6.84 cm/s LVOT diam:     1.90 cm   LV E/e' lateral: 12.5 LV SV:         61 LV SV Index:   35 LVOT Area:     2.84 cm  RIGHT VENTRICLE RV S prime:     10.60 cm/s TAPSE (M-mode): 1.9 cm LEFT ATRIUM           Index        RIGHT ATRIUM           Index LA diam:      2.90 cm 1.63 cm/m   RA Area:     11.30 cm LA Vol (A2C): 26.7 ml 15.05 ml/m  RA Volume:   24.80 ml  13.98 ml/m LA Vol (A4C): 46.6 ml 26.27 ml/m  AORTIC VALVE LVOT Vmax:   108.00 cm/s LVOT Vmean:  77.700 cm/s LVOT VTI:    0.216 m  AORTA Ao Root diam: 3.20 cm MITRAL VALVE MV Area (PHT): 2.95 cm     SHUNTS MV Decel Time: 257 msec     Systemic VTI:  0.22 m MV E velocity: 85.40 cm/s   Systemic Diam: 1.90 cm MV A velocity: 137.00 cm/s MV E/A ratio:  0.62 Vishnu Priya Mallipeddi Electronically signed by Diannah Late Mallipeddi Signature Date/Time: 10/24/2024/2:11:21 PM    Final    MR BRAIN WO CONTRAST Result Date: 10/24/2024 EXAM: MRI BRAIN WITHOUT CONTRAST 10/24/2024 01:17:10 PM TECHNIQUE: Multiplanar multisequence MRI of the head/brain was performed without the administration of intravenous contrast. COMPARISON: CT head and CTA head and neck from 10/23/2024 Brain MRI from 03/01/2020. CLINICAL HISTORY: 89 year old female with acute neuro deficit, stroke suspected, presenting yesterday. FINDINGS: BRAIN AND VENTRICLES: Intermittent motion artifact. No acute infarct. No intracranial hemorrhage. No mass. No midline shift. No hydrocephalus. Chronic cerebral volume loss, chronic periventricular white matter T2 and FLAIR hyperintensity not significantly progressed since 2021. The sella is  unremarkable. ORBITS: No acute abnormality. SINUSES AND MASTOIDS: Paranasal sinuses and mastoids well aerated. BONES AND SOFT TISSUES: Normal marrow signal. Motion degraded detail of the craniocervical junction and upper CERVICAL spine. IMPRESSION: 1. Intermittently motion degraded exam with No acute intracranial abnormality identified. Electronically signed by: Helayne Hurst MD 10/24/2024 01:30 PM EDT RP Workstation: [id number]   CT ANGIO HEAD NECK W WO CM (CODE STROKE) Result Date: 10/23/2024 CLINICAL DATA:  Initial evaluation for acute neuro deficit, stroke suspected. Right-sided deficits. EXAM: CT ANGIOGRAPHY HEAD AND NECK WITH AND WITHOUT CONTRAST TECHNIQUE: Multidetector CT imaging of the head and neck was performed using the standard protocol during bolus administration of intravenous contrast. Multiplanar CT image reconstructions and MIPs were obtained to evaluate the vascular anatomy. Carotid stenosis measurements (when applicable) are obtained utilizing NASCET criteria, using the distal internal carotid diameter as the denominator. RADIATION DOSE REDUCTION: This exam was performed according to  the departmental dose-optimization program which includes automated exposure control, adjustment of the mA and/or kV according to patient size and/or use of iterative reconstruction technique. CONTRAST:  60mL OMNIPAQUE  IOHEXOL  350 MG/ML SOLN COMPARISON:  Comparison made with head CT performed earlier the same day. FINDINGS: CTA NECK FINDINGS Aortic arch: Visualized aortic arch within normal limits for caliber. Origin of the great vessels incompletely visualized on this exam. Aortic atherosclerosis. No visible stenosis about the origin the great vessels. Right carotid system: Right common and internal carotid arteries are patent without dissection. Eccentric calcified plaque about the right carotid bulb without hemodynamically significant stenosis. Left carotid system: Left common and internal carotid arteries are  patent without dissection. Scattered calcified plaque about the left carotid bulb/proximal left ICA with up to 60% stenosis by NASCET criteria. Vertebral arteries: Left vertebral artery dominant. Vertebral arteries are patent without stenosis or dissection. Skeleton: No worrisome osseous lesions. Advanced multilevel cervical spondylosis noted. Other neck: No other acute finding. Upper chest: No other acute finding. Review of the MIP images confirms the above findings CTA HEAD FINDINGS Anterior circulation: Atheromatous change about the carotid siphons without hemodynamically significant stenosis. A1 segments patent bilaterally. Normal anterior communicating artery complex. Anterior cerebral arteries patent without significant stenosis. No M1 stenosis or occlusion. No proximal MCA branch occlusion or high-grade stenosis. Distal MCA branches perfused and symmetric. Posterior circulation: Both V4 segments patent without significant stenosis. Left PICA patent. Right PICA origin not well seen. Basilar patent without stenosis. Superior cerebellar arteries patent bilaterally. Left PCA supplied via the basilar. Right PCA supplied via a hypoplastic right P1 segment and prominent right posterior communicating artery. Both PCAs patent to their distal aspects without stenosis. Venous sinuses: Grossly patent allowing for timing the contrast bolus. Anatomic variants: As above.  No aneurysm. Review of the MIP images confirms the above findings IMPRESSION: 1. Negative CTA for large vessel occlusion or other emergent finding. 2. Atheromatous change about the left carotid bulb/proximal left ICA with up to 60% stenosis by NASCET criteria. 3. Additional mild atheromatous change about the right carotid bulb and carotid siphons without hemodynamically significant stenosis. Aortic Atherosclerosis (ICD10-I70.0). These results were communicated to Dr. Germaine at 7:03 pm on 10/23/2024 by text page via the [facility] messaging system.  Electronically Signed   By: Morene Hoard M.D.   On: 10/23/2024 19:18   CT HEAD CODE STROKE WO CONTRAST (LKW 0-4.5h, LVO 0-24h) Result Date: 10/23/2024 EXAM: CT HEAD WITHOUT 10/23/2024 06:14:00 PM TECHNIQUE: CT of the head was performed without the administration of intravenous contrast. Automated exposure control, iterative reconstruction, and/or weight based adjustment of the mA/kV was utilized to reduce the radiation dose to as low as reasonably achievable. COMPARISON: None available. CLINICAL HISTORY: Neuro deficit, acute, stroke suspected. FINDINGS: BRAIN AND VENTRICLES: No acute intracranial hemorrhage. No mass effect or midline shift. No extra-axial fluid collection. No evidence of acute infarct. No hydrocephalus. Generalized parenchymal volume loss and chronic small vessel ischemic changes. [place] Stroke Program Early CT Score (ASPECTS) Ganglionic (caudate, IC, lentiform nucleus, insula, M1-M3): 7 Supraganglionic (M4-M6): 3 Total: 10 ORBITS: No acute abnormality. SINUSES AND MASTOIDS: No acute abnormality. SOFT TISSUES AND SKULL: No acute skull fracture. No acute soft tissue abnormality. IMPRESSION: 1. No acute intracranial abnormality. 2. Generalized parenchymal volume loss and chronic small vessel ischemic changes. 3. ASPECTS: 10. Dr. Germaine paged at 6:18 PM Electronically signed by: Franky Stanford MD 10/23/2024 06:19 PM EDT RP Workstation: [id number]    ASSESSMENT & PLAN  1.  Lead motion artifact, not  atrial fibrillation per review of ECGs and telemetry.  2.  Sinus bradycardia on Toprol -XL 50 mg daily, outpatient dose.  3.  CAD status post inferior STEMI in January 2024.  Underwent DES to the proximal to the RCA and PTCA of the distal RCA at that time otherwise residual disease managed medically.  She has been on Brilinta  90 mg twice daily with aspirin  since then, no regular cardiology follow-up.  On Zocor  20 mg daily as an outpatient as well, LDL 78.  4.  Febrile illness, workup  per primary team.  Viral respiratory panel negative.  Blood cultures pending.  5.  Presentation with acute right-sided weakness and aphasia, no acute stroke by head CT and MRI.  No definite atrial fibrillation documented as discussed above.  No clear indication for anticoagulation at this point, no definitive atrial fibrillation has been documented.  Could always consider 30-day event recorder at discharge from a neurological perspective.  Agree with holding Toprol -XL for now, may be able to resume 25 (or 12.5) mg daily dose depending on heart rate and blood pressure trend.  Would suggest reducing Brilinta  to 60 mg twice daily for now as maintenance dose.  Resume Zocor  20 mg daily.  No further cardiac workup anticipated at this time.  For questions or updates, please contact [facility] Please consult [email or web address] for contact info under   Signed, Jayson Sierras, MD  10/26/2024 10:59 AM

## 2024-10-26 NOTE — Progress Notes (Signed)
 Pt caretaker Wanda taking pt home and present at bedside. Pt external cath removed, pt had small bm and was cleaned. D/C instructions given to daughter, Arland over the phone and pt's caretaker. Pt dc with NAD.

## 2024-10-26 NOTE — Plan of Care (Signed)
 Problem: Education: Goal: Knowledge of General Education information will improve Description: Including pain rating scale, medication(s)/side effects and non-pharmacologic comfort measures Outcome: Adequate for Discharge   Problem: Health Behavior/Discharge Planning: Goal: Ability to manage health-related needs will improve Outcome: Adequate for Discharge   Problem: Clinical Measurements: Goal: Ability to maintain clinical measurements within normal limits will improve 10/26/2024 1244 by Johnie Will HERO, RN Outcome: Adequate for Discharge 10/26/2024 1001 by Johnie Will HERO, RN Outcome: Progressing Goal: Will remain free from infection 10/26/2024 1244 by Johnie Will HERO, RN Outcome: Adequate for Discharge 10/26/2024 1001 by Johnie Will HERO, RN Outcome: Progressing Goal: Diagnostic test results will improve 10/26/2024 1244 by Johnie Will HERO, RN Outcome: Adequate for Discharge 10/26/2024 1001 by Johnie Will HERO, RN Outcome: Progressing Goal: Respiratory complications will improve 10/26/2024 1244 by Johnie Will HERO, RN Outcome: Adequate for Discharge 10/26/2024 1001 by Johnie Will HERO, RN Outcome: Progressing Goal: Cardiovascular complication will be avoided 10/26/2024 1244 by Johnie Will HERO, RN Outcome: Adequate for Discharge 10/26/2024 1001 by Johnie Will HERO, RN Outcome: Progressing   Problem: Activity: Goal: Risk for activity intolerance will decrease 10/26/2024 1244 by Johnie Will HERO, RN Outcome: Adequate for Discharge 10/26/2024 1001 by Johnie Will HERO, RN Outcome: Progressing   Problem: Nutrition: Goal: Adequate nutrition will be maintained 10/26/2024 1244 by Johnie Will HERO, RN Outcome: Adequate for Discharge 10/26/2024 1001 by Johnie Will HERO, RN Outcome: Progressing   Problem: Coping: Goal: Level of anxiety will decrease 10/26/2024 1244 by Johnie Will HERO, RN Outcome: Adequate for Discharge 10/26/2024 1001  by Johnie Will HERO, RN Outcome: Progressing   Problem: Elimination: Goal: Will not experience complications related to bowel motility 10/26/2024 1244 by Johnie Will HERO, RN Outcome: Adequate for Discharge 10/26/2024 1001 by Johnie Will HERO, RN Outcome: Progressing Goal: Will not experience complications related to urinary retention 10/26/2024 1244 by Johnie Will HERO, RN Outcome: Adequate for Discharge 10/26/2024 1001 by Johnie Will HERO, RN Outcome: Progressing   Problem: Pain Managment: Goal: General experience of comfort will improve and/or be controlled 10/26/2024 1244 by Johnie Will HERO, RN Outcome: Adequate for Discharge 10/26/2024 1001 by Johnie Will HERO, RN Outcome: Progressing   Problem: Safety: Goal: Ability to remain free from injury will improve 10/26/2024 1244 by Johnie Will HERO, RN Outcome: Adequate for Discharge 10/26/2024 1001 by Johnie Will HERO, RN Outcome: Progressing   Problem: Skin Integrity: Goal: Risk for impaired skin integrity will decrease 10/26/2024 1244 by Johnie Will HERO, RN Outcome: Adequate for Discharge 10/26/2024 1001 by Johnie Will HERO, RN Outcome: Progressing   Problem: Education: Goal: Knowledge of disease or condition will improve 10/26/2024 1244 by Johnie Will HERO, RN Outcome: Adequate for Discharge 10/26/2024 9275 by Johnie Will HERO, RN Outcome: Not Progressing Goal: Knowledge of secondary prevention will improve (MUST DOCUMENT ALL) 10/26/2024 1244 by Johnie Will HERO, RN Outcome: Adequate for Discharge 10/26/2024 0724 by Johnie Will HERO, RN Outcome: Not Progressing Goal: Knowledge of patient specific risk factors will improve (DELETE if not current risk factor) 10/26/2024 1244 by Johnie Will HERO, RN Outcome: Adequate for Discharge 10/26/2024 0724 by Johnie Will HERO, RN Outcome: Not Progressing   Problem: Ischemic Stroke/TIA Tissue Perfusion: Goal: Complications of ischemic  stroke/TIA will be minimized 10/26/2024 1244 by Johnie Will HERO, RN Outcome: Adequate for Discharge 10/26/2024 0724 by Johnie Will HERO, RN Outcome: Progressing   Problem: Coping: Goal: Will verbalize positive feelings about self 10/26/2024 1244 by Johnie Will HERO, RN Outcome: Adequate for Discharge 10/26/2024 0724 by Johnie Will  M, RN Outcome: Progressing Goal: Will identify appropriate support needs 10/26/2024 1244 by Johnie Will HERO, RN Outcome: Adequate for Discharge 10/26/2024 9275 by Johnie Will HERO, RN Outcome: Progressing   Problem: Health Behavior/Discharge Planning: Goal: Ability to manage health-related needs will improve 10/26/2024 1244 by Johnie Will HERO, RN Outcome: Adequate for Discharge 10/26/2024 0724 by Johnie Will HERO, RN Outcome: Not Progressing Goal: Goals will be collaboratively established with patient/family 10/26/2024 1244 by Johnie Will HERO, RN Outcome: Adequate for Discharge 10/26/2024 0724 by Johnie Will HERO, RN Outcome: Progressing   Problem: Self-Care: Goal: Ability to participate in self-care as condition permits will improve 10/26/2024 1244 by Johnie Will HERO, RN Outcome: Adequate for Discharge 10/26/2024 9275 by Johnie Will HERO, RN Outcome: Not Progressing Goal: Verbalization of feelings and concerns over difficulty with self-care will improve 10/26/2024 1244 by Johnie Will HERO, RN Outcome: Adequate for Discharge 10/26/2024 9275 by Johnie Will HERO, RN Outcome: Progressing Goal: Ability to communicate needs accurately will improve 10/26/2024 1244 by Johnie Will HERO, RN Outcome: Adequate for Discharge 10/26/2024 9275 by Johnie Will HERO, RN Outcome: Progressing   Problem: Nutrition: Goal: Risk of aspiration will decrease 10/26/2024 1244 by Johnie Will HERO, RN Outcome: Adequate for Discharge 10/26/2024 0724 by Johnie Will HERO, RN Outcome: Progressing Goal: Dietary intake will  improve 10/26/2024 1244 by Johnie Will HERO, RN Outcome: Adequate for Discharge 10/26/2024 9275 by Johnie Will HERO, RN Outcome: Progressing   Problem: Acute Rehab PT Goals(only PT should resolve) Goal: Pt will Roll Supine to Side Outcome: Adequate for Discharge Goal: Pt Will Go Supine/Side To Sit Outcome: Adequate for Discharge Goal: Patient Will Perform Sitting Balance Outcome: Adequate for Discharge Goal: Pt Will Transfer Bed To Chair/Chair To Bed Outcome: Adequate for Discharge

## 2024-10-26 NOTE — Plan of Care (Signed)
  Problem: Ischemic Stroke/TIA Tissue Perfusion: Goal: Complications of ischemic stroke/TIA will be minimized Outcome: Progressing   Problem: Coping: Goal: Will verbalize positive feelings about self Outcome: Progressing Goal: Will identify appropriate support needs Outcome: Progressing   Problem: Health Behavior/Discharge Planning: Goal: Goals will be collaboratively established with patient/family Outcome: Progressing   Problem: Self-Care: Goal: Verbalization of feelings and concerns over difficulty with self-care will improve Outcome: Progressing Goal: Ability to communicate needs accurately will improve Outcome: Progressing   Problem: Nutrition: Goal: Risk of aspiration will decrease Outcome: Progressing Goal: Dietary intake will improve Outcome: Progressing   Problem: Education: Goal: Knowledge of disease or condition will improve Outcome: Not Progressing Goal: Knowledge of secondary prevention will improve (MUST DOCUMENT ALL) Outcome: Not Progressing Goal: Knowledge of patient specific risk factors will improve (DELETE if not current risk factor) Outcome: Not Progressing   Problem: Health Behavior/Discharge Planning: Goal: Ability to manage health-related needs will improve Outcome: Not Progressing   Problem: Self-Care: Goal: Ability to participate in self-care as condition permits will improve Outcome: Not Progressing

## 2024-10-26 NOTE — Plan of Care (Signed)
  Problem: Nutrition: Goal: Risk of aspiration will decrease Outcome: Progressing Note: SLP gave recommendations, patient diet order changed from npo sips with meds to dysphagia 1.  Problem: Nutrition: Goal: Dietary intake will improve Outcome: Progressing Note: Patient should be assisted with feeding.

## 2024-10-26 NOTE — Discharge Summary (Signed)
 Physician Discharge Summary  [name] FMW:[id number] DOB: [birth year] DOA: 10/23/2024  PCP: [name] Rush, MD  Admit date: 10/23/2024 Discharge date: 10/26/2024  Admitted From: Home Disposition: Home  Recommendations for Outpatient Follow-up:  Follow up with PCP in 1 week with repeat CBC/BMP and EKG Follow-up with neurology. Follow up in ED if symptoms worsen or new appear   Home Health: No Equipment/Devices: None  Discharge Condition: Stable CODE STATUS: dnr Diet recommendation: Heart healthy  Brief/Interim Summary:  [name] is a 89 y.o. female with medical history significant of hypertension, hyperlipidemia, T2DM, coronary artery disease status post STEMI 12/2022, prior stroke, dementia who presents to the emergency department from home via EMS for evaluation of acute right-sided weakness.  Patient was seen by her care giver around 10 AM this morning, she had breakfast and caregiver went to take care of other house chores, on returning to check on patient around 12:30 PM, she was not able to speak and the right side appears to be weak.  Son came in shortly after this and noted that patient was having difficulty in being able to communicate and same was confirmed by daughter who came in later in the afternoon.  EMS was activated and patient was sent to the ED for further evaluation and management.  Patient was incontinent, wheelchair-bound and requires assistance with ADLs at baseline.   ED Course In the emergency department, BP was elevated at 152/122, she was initially tachycardic, but this subsequently normalized.  Other vital signs are within normal range.  Workup in the ED showed normal CBC and BMP except for blood glucose of 202, alcohol level was undetectable.  Urine drug screen was negative.  Urinalysis negative. CT head without contrast showed no acute intracranial abnormality CT angiography of head and neck with and without contrast showed negative CT  angiography for LVO or other emergent finding.  Atheromatous change about the left carotid bulb/proximal left ICA with up to 60% stenosis by NASCET criteria. Teleneurology was consulted and recommended admitting patient for further stroke workup.  Hospital course:  Right-sided weakness, aphasia Previous history of CVA History of dementia  Stroke workup neg: Etiology possibly TIA versus secondary to febrile illness, dehydration etc. MRI shows no acute findings although motion degraded exam. Echocardiogram: Normal left ventricular function, no wall motion abnormalities, no significant valvular abnormalities, no PFO Total cholesterol 167, LDL 78, HDL 70, hemoglobin A1c 7.4. Swallow evaluation, PT/OT  Continue Brilinta .  Cardiology recommended decreasing to 60 mg twice daily. Continue Zocor  Follow-up with outpatient neurology   -Questionable A-fib: -EKG in the ER reported as A-fib RVR however per cardiology review this is not A-fib - Telemetry has some some NSVT's and persistent sinus bradycardia in 40s to 50s.  We will discontinue metoprolol .  May resume at a lower dose if needed. - Will need to follow-up with PCP in 1 week.  Hypertension: Losartan  25 mg daily added.  Follow-up with PCP  Acute febrile illness Patient was noted with a temperature of 100.5 F.  Fever has resolved.  White count has improved. No known source of infectious process at this time.  COVID-19, influenza A, influenza B, RSV negative.  No respiratory symptoms to suggest pneumonia.  Urinalysis shows positive hemoglobin but normal RBCs, WBCs 0.5, nitrite negative, mild leukocyte esterase.  Patient received broad-spectrum antibiotics in the hospital.  Blood cultures remain negative. No antibiotics on discharge.    Mild rhabdomyolysis: Hemoglobin positive in the urine dipstick.  CK was checked, elevated at 500.  This is after IV hydration.  She was continued on IV hydration until discharge.  Type 2 diabetes mellitus  with peripheral neuropathy  Continue metformin   CAD (coronary artery disease), STEMI January 2024 status post PCI: -Will discontinue metoprolol  due to bradycardia. - Brilinta  decreased to 60 mg twice daily per cardiology recommendation - Does not appear to follow-up with cardiology regularly.  Follow-up with cardiology if desired, if not continue outpatient follow-up with PCP   Mixed hyperlipidemia Continue statin therapy    Depression Continue Zoloft     Hypokalemia/hypomagnesemia: replaced  Disposition: Home with family. Discharge Diagnoses:  Principal Problem:   Acute right-sided weakness Active Problems:   Essential hypertension   Acute metabolic encephalopathy   Type 2 diabetes mellitus with peripheral neuropathy (HCC)   Hyperlipidemia associated with type 2 diabetes mellitus (HCC)   Major neurocognitive disorder (HCC)   Hypokalemia   Vascular dementia without behavioral disturbance (HCC)   DNR (do not resuscitate)    Discharge Instructions  Discharge Instructions     Ambulatory referral to Neurology   Complete by: As directed    An appointment is requested in approximately: 2 weeks  Diagnosis: Altered mental status, strokelike symptoms, possible TIA   Call MD for:  difficulty breathing, headache or visual disturbances   Complete by: As directed    Call MD for:  persistant dizziness or light-headedness   Complete by: As directed    Diet - low sodium heart healthy   Complete by: As directed    Discharge instructions   Complete by: As directed    1.  Stop taking metoprolol  due to low heart rate.  Start taking Cozaar  25 mg daily for high blood pressure.  Please follow-up with primary care provider within a week. 2.  Decrease Brilinta  to 60 mg twice daily from 90 mg twice daily.  Prescription have been sent 3.  Return to the ER if recurrence of symptoms.   Increase activity slowly   Complete by: As directed       Allergies as of 10/26/2024       Reactions    Glipizide  Other (See Comments)   Hypoglycemia with glucoses recorded in the 20s necessitating glucagon and D10 IV   Codeine Nausea And Vomiting, Nausea Only   Sulfa  Antibiotics Other (See Comments)   Unknown          Medication List     STOP taking these medications    metoprolol  succinate 50 MG 24 hr tablet Commonly known as: TOPROL -XL       TAKE these medications    losartan  25 MG tablet Commonly known as: COZAAR  Take 1 tablet (25 mg total) by mouth daily. Start taking on: October 27, 2024   metFORMIN  500 MG tablet Commonly known as: GLUCOPHAGE  Take 500 mg by mouth 2 (two) times daily.   oxybutynin 10 MG 24 hr tablet Commonly known as: DITROPAN-XL Take 10 mg by mouth daily.   simvastatin  20 MG tablet Commonly known as: ZOCOR  Take 20 mg by mouth every evening.   tamsulosin  0.4 MG Caps capsule Commonly known as: FLOMAX  Take 0.4 mg by mouth daily after supper.   ticagrelor  60 MG Tabs tablet Commonly known as: BRILINTA  Take 1 tablet (60 mg total) by mouth 2 (two) times daily. What changed:  medication strength how much to take   Zoloft  50 MG tablet Generic drug: sertraline  Take 50 mg by mouth daily. Take with 25mg  dose to equal 75mg  daily.   sertraline  25 MG tablet Commonly known  as: ZOLOFT  Take 25 mg by mouth daily. Take with 50mg  dose to equal 75mg  daily.   zolpidem  10 MG tablet Commonly known as: AMBIEN  Take 10 mg by mouth at bedtime as needed for sleep.        Follow-up Information     [name] Rush, MD. Schedule an appointment as soon as possible for a visit in 1 week(s).   Specialty: Family Medicine Contact information: [address] [place] [address] [phone]         TatAsberry RAMAN, DO. Call in 2 week(s).   Specialty: Neurology Contact information: [address] [place]  [address] [place] [address] [phone]                Allergies  Allergen Reactions   Glipizide  Other (See Comments)     Hypoglycemia with glucoses recorded in the 20s necessitating glucagon and D10 IV   Codeine Nausea And Vomiting and Nausea Only   Sulfa  Antibiotics Other (See Comments)    Unknown      Consultations:    Procedures/Studies: ECHOCARDIOGRAM COMPLETE Result Date: 10/24/2024    ECHOCARDIOGRAM REPORT   Patient Name:   JOEANNE ROBICHEAUX Date of Exam: 10/24/2024 Medical Rec #:  [id number]           Height:       67.0 in Accession #:    [id number]          Weight:       147.0 lb Date of Birth:  [birth year]          BSA:          1.774 m Patient Age:    89 years            BP:           128/71 mmHg Patient Gender: F                   HR:           80 bpm. Exam Location:  [facility] Procedure: 2D Echo, Cardiac Doppler and Color Doppler (Both Spectral and Color            Flow Doppler were utilized during procedure). Indications:    Stroke l63.9  History:        Patient has prior history of Echocardiogram examinations, most                 recent 01/01/2023. CAD and Previous Myocardial Infarction, Stroke;                 Risk Factors:Hypertension, Diabetes and Dyslipidemia.  Sonographer:    Aida Pizza RCS Referring Phys: [id number] OLADAPO ADEFESO IMPRESSIONS  1. Left ventricular ejection fraction, by estimation, is 60 to 65%. The left ventricle has normal function. Left ventricular endocardial border not optimally defined to evaluate regional wall motion. There is mild left ventricular hypertrophy. Left ventricular diastolic parameters are consistent with Grade I diastolic dysfunction (impaired relaxation). Elevated left ventricular end-diastolic pressure.  2. Right ventricular systolic function is normal. The right ventricular size is normal. Tricuspid regurgitation signal is inadequate for assessing PA pressure.  3. The mitral valve is abnormal. No evidence of mitral valve regurgitation. No evidence of mitral stenosis. Severe mitral annular calcification.  4. The aortic valve is tricuspid. Aortic valve regurgitation  is not visualized. Aortic valve sclerosis/calcification is present, without any evidence of aortic stenosis.  5. The inferior vena cava is normal in size with greater than 50%  respiratory variability, suggesting right atrial pressure of 3 mmHg. Comparison(s): No significant change from prior study. FINDINGS  Left Ventricle: Left ventricular ejection fraction, by estimation, is 60 to 65%. The left ventricle has normal function. Left ventricular endocardial border not optimally defined to evaluate regional wall motion. Strain was performed and the global longitudinal strain is indeterminate. The left ventricular internal cavity size was normal in size. There is mild left ventricular hypertrophy. Left ventricular diastolic parameters are consistent with Grade I diastolic dysfunction (impaired relaxation).  Elevated left ventricular end-diastolic pressure. Right Ventricle: The right ventricular size is normal. No increase in right ventricular wall thickness. Right ventricular systolic function is normal. Tricuspid regurgitation signal is inadequate for assessing PA pressure. Left Atrium: Left atrial size was normal in size. Right Atrium: Right atrial size was normal in size. Pericardium: There is no evidence of pericardial effusion. Mitral Valve: The mitral valve is abnormal. Severe mitral annular calcification. No evidence of mitral valve regurgitation. No evidence of mitral valve stenosis. Tricuspid Valve: The tricuspid valve is normal in structure. Tricuspid valve regurgitation is not demonstrated. No evidence of tricuspid stenosis. Aortic Valve: The aortic valve is tricuspid. Aortic valve regurgitation is not visualized. Aortic valve sclerosis/calcification is present, without any evidence of aortic stenosis. Pulmonic Valve: The pulmonic valve was not well visualized. Pulmonic valve regurgitation is not visualized. No evidence of pulmonic stenosis. Aorta: The aortic root is normal in size and structure. Venous: The  inferior vena cava is normal in size with greater than 50% respiratory variability, suggesting right atrial pressure of 3 mmHg. IAS/Shunts: The atrial septum is grossly normal. Additional Comments: 3D was performed not requiring image post processing on an independent workstation and was indeterminate.  LEFT VENTRICLE PLAX 2D LVIDd:         3.43 cm   Diastology LVIDs:         2.53 cm   LV e' medial:    3.85 cm/s LV PW:         0.90 cm   LV E/e' medial:  22.2 LV IVS:        1.30 cm   LV e' lateral:   6.84 cm/s LVOT diam:     1.90 cm   LV E/e' lateral: 12.5 LV SV:         61 LV SV Index:   35 LVOT Area:     2.84 cm  RIGHT VENTRICLE RV S prime:     10.60 cm/s TAPSE (M-mode): 1.9 cm LEFT ATRIUM           Index        RIGHT ATRIUM           Index LA diam:      2.90 cm 1.63 cm/m   RA Area:     11.30 cm LA Vol (A2C): 26.7 ml 15.05 ml/m  RA Volume:   24.80 ml  13.98 ml/m LA Vol (A4C): 46.6 ml 26.27 ml/m  AORTIC VALVE LVOT Vmax:   108.00 cm/s LVOT Vmean:  77.700 cm/s LVOT VTI:    0.216 m  AORTA Ao Root diam: 3.20 cm MITRAL VALVE MV Area (PHT): 2.95 cm     SHUNTS MV Decel Time: 257 msec     Systemic VTI:  0.22 m MV E velocity: 85.40 cm/s   Systemic Diam: 1.90 cm MV A velocity: 137.00 cm/s MV E/A ratio:  0.62 Vishnu Priya Mallipeddi Electronically signed by Diannah Late Mallipeddi Signature Date/Time: 10/24/2024/2:11:21 PM    Final  MR BRAIN WO CONTRAST Result Date: 10/24/2024 EXAM: MRI BRAIN WITHOUT CONTRAST 10/24/2024 01:17:10 PM TECHNIQUE: Multiplanar multisequence MRI of the head/brain was performed without the administration of intravenous contrast. COMPARISON: CT head and CTA head and neck from 10/23/2024 Brain MRI from 03/01/2020. CLINICAL HISTORY: 89 year old female with acute neuro deficit, stroke suspected, presenting yesterday. FINDINGS: BRAIN AND VENTRICLES: Intermittent motion artifact. No acute infarct. No intracranial hemorrhage. No mass. No midline shift. No hydrocephalus. Chronic cerebral volume  loss, chronic periventricular white matter T2 and FLAIR hyperintensity not significantly progressed since 2021. The sella is unremarkable. ORBITS: No acute abnormality. SINUSES AND MASTOIDS: Paranasal sinuses and mastoids well aerated. BONES AND SOFT TISSUES: Normal marrow signal. Motion degraded detail of the craniocervical junction and upper CERVICAL spine. IMPRESSION: 1. Intermittently motion degraded exam with No acute intracranial abnormality identified. Electronically signed by: Helayne Hurst MD 10/24/2024 01:30 PM EDT RP Workstation: [id number]   CT ANGIO HEAD NECK W WO CM (CODE STROKE) Result Date: 10/23/2024 CLINICAL DATA:  Initial evaluation for acute neuro deficit, stroke suspected. Right-sided deficits. EXAM: CT ANGIOGRAPHY HEAD AND NECK WITH AND WITHOUT CONTRAST TECHNIQUE: Multidetector CT imaging of the head and neck was performed using the standard protocol during bolus administration of intravenous contrast. Multiplanar CT image reconstructions and MIPs were obtained to evaluate the vascular anatomy. Carotid stenosis measurements (when applicable) are obtained utilizing NASCET criteria, using the distal internal carotid diameter as the denominator. RADIATION DOSE REDUCTION: This exam was performed according to the departmental dose-optimization program which includes automated exposure control, adjustment of the mA and/or kV according to patient size and/or use of iterative reconstruction technique. CONTRAST:  60mL OMNIPAQUE  IOHEXOL  350 MG/ML SOLN COMPARISON:  Comparison made with head CT performed earlier the same day. FINDINGS: CTA NECK FINDINGS Aortic arch: Visualized aortic arch within normal limits for caliber. Origin of the great vessels incompletely visualized on this exam. Aortic atherosclerosis. No visible stenosis about the origin the great vessels. Right carotid system: Right common and internal carotid arteries are patent without dissection. Eccentric calcified plaque about the right  carotid bulb without hemodynamically significant stenosis. Left carotid system: Left common and internal carotid arteries are patent without dissection. Scattered calcified plaque about the left carotid bulb/proximal left ICA with up to 60% stenosis by NASCET criteria. Vertebral arteries: Left vertebral artery dominant. Vertebral arteries are patent without stenosis or dissection. Skeleton: No worrisome osseous lesions. Advanced multilevel cervical spondylosis noted. Other neck: No other acute finding. Upper chest: No other acute finding. Review of the MIP images confirms the above findings CTA HEAD FINDINGS Anterior circulation: Atheromatous change about the carotid siphons without hemodynamically significant stenosis. A1 segments patent bilaterally. Normal anterior communicating artery complex. Anterior cerebral arteries patent without significant stenosis. No M1 stenosis or occlusion. No proximal MCA branch occlusion or high-grade stenosis. Distal MCA branches perfused and symmetric. Posterior circulation: Both V4 segments patent without significant stenosis. Left PICA patent. Right PICA origin not well seen. Basilar patent without stenosis. Superior cerebellar arteries patent bilaterally. Left PCA supplied via the basilar. Right PCA supplied via a hypoplastic right P1 segment and prominent right posterior communicating artery. Both PCAs patent to their distal aspects without stenosis. Venous sinuses: Grossly patent allowing for timing the contrast bolus. Anatomic variants: As above.  No aneurysm. Review of the MIP images confirms the above findings IMPRESSION: 1. Negative CTA for large vessel occlusion or other emergent finding. 2. Atheromatous change about the left carotid bulb/proximal left ICA with up to 60% stenosis by NASCET criteria. 3. Additional  mild atheromatous change about the right carotid bulb and carotid siphons without hemodynamically significant stenosis. Aortic Atherosclerosis (ICD10-I70.0).  These results were communicated to Dr. Germaine at 7:03 pm on 10/23/2024 by text page via the [facility] messaging system. Electronically Signed   By: Morene Hoard M.D.   On: 10/23/2024 19:18   CT HEAD CODE STROKE WO CONTRAST (LKW 0-4.5h, LVO 0-24h) Result Date: 10/23/2024 EXAM: CT HEAD WITHOUT 10/23/2024 06:14:00 PM TECHNIQUE: CT of the head was performed without the administration of intravenous contrast. Automated exposure control, iterative reconstruction, and/or weight based adjustment of the mA/kV was utilized to reduce the radiation dose to as low as reasonably achievable. COMPARISON: None available. CLINICAL HISTORY: Neuro deficit, acute, stroke suspected. FINDINGS: BRAIN AND VENTRICLES: No acute intracranial hemorrhage. No mass effect or midline shift. No extra-axial fluid collection. No evidence of acute infarct. No hydrocephalus. Generalized parenchymal volume loss and chronic small vessel ischemic changes. [place] Stroke Program Early CT Score (ASPECTS) Ganglionic (caudate, IC, lentiform nucleus, insula, M1-M3): 7 Supraganglionic (M4-M6): 3 Total: 10 ORBITS: No acute abnormality. SINUSES AND MASTOIDS: No acute abnormality. SOFT TISSUES AND SKULL: No acute skull fracture. No acute soft tissue abnormality. IMPRESSION: 1. No acute intracranial abnormality. 2. Generalized parenchymal volume loss and chronic small vessel ischemic changes. 3. ASPECTS: 10. Dr. Germaine paged at 6:18 PM Electronically signed by: Franky Stanford MD 10/23/2024 06:19 PM EDT RP Workstation: [id number]      Subjective:   Discharge Exam: Vitals:   10/26/24 0739 10/26/24 1200  BP: (!) 159/68 (!) 157/58  Pulse: (!) 48 70  Resp: 10   Temp: 97.6 F (36.4 C) 98.1 F (36.7 C)  SpO2: 98% 97%    General: Pt is alert, awake, not in acute distress Cardiovascular: rate controlled, S1/S2 + Respiratory: bilateral decreased breath sounds at bases Abdominal: Soft, NT, ND, bowel sounds + Extremities: no edema, no  cyanosis    The results of significant diagnostics from this hospitalization (including imaging, microbiology, ancillary and laboratory) are listed below for reference.     Microbiology: Recent Results (from the past 240 hours)  Culture, blood (Routine X 2) w Reflex to ID Panel     Status: None (Preliminary result)   Collection Time: 10/23/24 10:52 PM   Specimen: BLOOD RIGHT HAND  Result Value Ref Range Status   Specimen Description BLOOD RIGHT HAND  Final   Special Requests   Final    BOTTLES DRAWN AEROBIC AND ANAEROBIC Blood Culture adequate volume   Culture   Final    NO GROWTH 3 DAYS Performed at [facility], [address] [place], [place] [address]    Report Status PENDING  Incomplete  Culture, blood (Routine X 2) w Reflex to ID Panel     Status: None (Preliminary result)   Collection Time: 10/23/24 10:53 PM   Specimen: BLOOD LEFT HAND  Result Value Ref Range Status   Specimen Description BLOOD LEFT HAND  Final   Special Requests   Final    BOTTLES DRAWN AEROBIC AND ANAEROBIC Blood Culture adequate volume   Culture   Final    NO GROWTH 3 DAYS Performed at [facility], [address] [place], [place] [address]    Report Status PENDING  Incomplete  Resp panel by RT-PCR (RSV, Flu A&B, Covid) Anterior Nasal Swab     Status: None   Collection Time: 10/25/24 10:43 AM   Specimen: Anterior Nasal Swab  Result Value Ref Range Status   SARS Coronavirus 2 by RT PCR NEGATIVE NEGATIVE  Final    Comment: (NOTE) SARS-CoV-2 target nucleic acids are NOT DETECTED.  The SARS-CoV-2 RNA is generally detectable in upper respiratory specimens during the acute phase of infection. The lowest concentration of SARS-CoV-2 viral copies this assay can detect is 138 copies/mL. A negative result does not preclude SARS-Cov-2 infection and should not be used as the sole basis for treatment or other patient management decisions. A negative result may occur with  improper specimen  collection/handling, submission of specimen other than nasopharyngeal swab, presence of viral mutation(s) within the areas targeted by this assay, and inadequate number of viral copies(<138 copies/mL). A negative result must be combined with clinical observations, patient history, and epidemiological information. The expected result is Negative.  Fact Sheet for Patients:  bloggercourse.com  Fact Sheet for Healthcare Providers:  seriousbroker.it  This test is no t yet approved or cleared by the [place]  FDA and  has been authorized for detection and/or diagnosis of SARS-CoV-2 by FDA under an Emergency Use Authorization (EUA). This EUA will remain  in effect (meaning this test can be used) for the duration of the COVID-19 declaration under Section 564(b)(1) of the Act, 21 U.S.C.section 360bbb-3(b)(1), unless the authorization is terminated  or revoked sooner.       Influenza A by PCR NEGATIVE NEGATIVE Final   Influenza B by PCR NEGATIVE NEGATIVE Final    Comment: (NOTE) The Xpert Xpress SARS-CoV-2/FLU/RSV plus assay is intended as an aid in the diagnosis of influenza from Nasopharyngeal swab specimens and should not be used as a sole basis for treatment. Nasal washings and aspirates are unacceptable for Xpert Xpress SARS-CoV-2/FLU/RSV testing.  Fact Sheet for Patients: bloggercourse.com  Fact Sheet for Healthcare Providers: seriousbroker.it  This test is not yet approved or cleared by the [place]  FDA and has been authorized for detection and/or diagnosis of SARS-CoV-2 by FDA under an Emergency Use Authorization (EUA). This EUA will remain in effect (meaning this test can be used) for the duration of the COVID-19 declaration under Section 564(b)(1) of the Act, 21 U.S.C. section 360bbb-3(b)(1), unless the authorization is terminated or revoked.     Resp Syncytial  Virus by PCR NEGATIVE NEGATIVE Final    Comment: (NOTE) Fact Sheet for Patients: bloggercourse.com  Fact Sheet for Healthcare Providers: seriousbroker.it  This test is not yet approved or cleared by the [place]  FDA and has been authorized for detection and/or diagnosis of SARS-CoV-2 by FDA under an Emergency Use Authorization (EUA). This EUA will remain in effect (meaning this test can be used) for the duration of the COVID-19 declaration under Section 564(b)(1) of the Act, 21 U.S.C. section 360bbb-3(b)(1), unless the authorization is terminated or revoked.  Performed at [facility], [address], [place], [place] [address]      Labs: BNP (last 3 results) No results for input(s): BNP in the last 8760 hours. Basic Metabolic Panel: Recent Labs  Lab 10/23/24 1805 10/23/24 1811 10/24/24 0347 10/25/24 0758  NA 141 140 137 137  K 4.0 3.8 3.0* 3.1*  CL 103 105 97* 104  CO2 22  --  16* 22  GLUCOSE 202* 200* 326* 164*  BUN 15 15 14 20   CREATININE 0.66 0.70 0.78 0.80  CALCIUM  9.8  --  9.3 8.8*  MG  --   --  1.5* 1.8  PHOS  --   --  3.0  --    Liver Function Tests: Recent Labs  Lab 10/23/24 1805 10/24/24 0347  AST 18 19  ALT 9 9  ALKPHOS 90 93  BILITOT 0.7 0.7  PROT 6.9 7.1  ALBUMIN 4.1 4.3   No results for input(s): LIPASE, AMYLASE in the last 168 hours. No results for input(s): AMMONIA in the last 168 hours. CBC: Recent Labs  Lab 10/23/24 1805 10/23/24 1811 10/24/24 0347 10/25/24 0758  WBC 9.1  --  17.6* 12.1*  NEUTROABS 6.7  --   --  9.7*  HGB 13.3 13.3 13.5 11.6*  HCT 38.6 39.0 39.5 33.4*  MCV 88.5  --  87.4 89.1  PLT 243  --  259 207   Cardiac Enzymes: Recent Labs  Lab 10/25/24 0758  CKTOTAL 425*   BNP: Invalid input(s): POCBNP CBG: Recent Labs  Lab 10/23/24 1802 10/24/24 1647  GLUCAP 171* 202*   D-Dimer No results for input(s): DDIMER in the last 72 hours. Hgb  A1c Recent Labs    10/23/24 1812  HGBA1C 7.4*   Lipid Profile Recent Labs    10/24/24 0347  CHOL 167  HDL 70  LDLCALC 78  TRIG 96  CHOLHDL 2.4   Thyroid  function studies No results for input(s): TSH, T4TOTAL, T3FREE, THYROIDAB in the last 72 hours.  Invalid input(s): FREET3 Anemia work up No results for input(s): VITAMINB12, FOLATE, FERRITIN, TIBC, IRON, RETICCTPCT in the last 72 hours. Urinalysis    Component Value Date/Time   COLORURINE AMBER (A) 10/23/2024 1804   APPEARANCEUR CLOUDY (A) 10/23/2024 1804   APPEARANCEUR Clear 10/25/2021 1450   LABSPEC 1.015 10/23/2024 1804   PHURINE 7.0 10/23/2024 1804   GLUCOSEU >=500 (A) 10/23/2024 1804   HGBUR MODERATE (A) 10/23/2024 1804   BILIRUBINUR NEGATIVE 10/23/2024 1804   BILIRUBINUR Negative 10/25/2021 1450   KETONESUR 5 (A) 10/23/2024 1804   PROTEINUR 30 (A) 10/23/2024 1804   UROBILINOGEN 0.2 09/06/2009 0135   NITRITE NEGATIVE 10/23/2024 1804   LEUKOCYTESUR SMALL (A) 10/23/2024 1804   Sepsis Labs Recent Labs  Lab 10/23/24 1805 10/24/24 0347 10/25/24 0758  WBC 9.1 17.6* 12.1*   Microbiology Recent Results (from the past 240 hours)  Culture, blood (Routine X 2) w Reflex to ID Panel     Status: None (Preliminary result)   Collection Time: 10/23/24 10:52 PM   Specimen: BLOOD RIGHT HAND  Result Value Ref Range Status   Specimen Description BLOOD RIGHT HAND  Final   Special Requests   Final    BOTTLES DRAWN AEROBIC AND ANAEROBIC Blood Culture adequate volume   Culture   Final    NO GROWTH 3 DAYS Performed at [facility], [address], [place], [place] [address]    Report Status PENDING  Incomplete  Culture, blood (Routine X 2) w Reflex to ID Panel     Status: None (Preliminary result)   Collection Time: 10/23/24 10:53 PM   Specimen: BLOOD LEFT HAND  Result Value Ref Range Status   Specimen Description BLOOD LEFT HAND  Final   Special Requests   Final    BOTTLES DRAWN AEROBIC AND  ANAEROBIC Blood Culture adequate volume   Culture   Final    NO GROWTH 3 DAYS Performed at [facility], [address], [place], [place] [address]    Report Status PENDING  Incomplete  Resp panel by RT-PCR (RSV, Flu A&B, Covid) Anterior Nasal Swab     Status: None   Collection Time: 10/25/24 10:43 AM   Specimen: Anterior Nasal Swab  Result Value Ref Range Status   SARS Coronavirus 2 by RT PCR NEGATIVE NEGATIVE Final    Comment: (NOTE) SARS-CoV-2 target  nucleic acids are NOT DETECTED.  The SARS-CoV-2 RNA is generally detectable in upper respiratory specimens during the acute phase of infection. The lowest concentration of SARS-CoV-2 viral copies this assay can detect is 138 copies/mL. A negative result does not preclude SARS-Cov-2 infection and should not be used as the sole basis for treatment or other patient management decisions. A negative result may occur with  improper specimen collection/handling, submission of specimen other than nasopharyngeal swab, presence of viral mutation(s) within the areas targeted by this assay, and inadequate number of viral copies(<138 copies/mL). A negative result must be combined with clinical observations, patient history, and epidemiological information. The expected result is Negative.  Fact Sheet for Patients:  bloggercourse.com  Fact Sheet for Healthcare Providers:  seriousbroker.it  This test is no t yet approved or cleared by the [place]  FDA and  has been authorized for detection and/or diagnosis of SARS-CoV-2 by FDA under an Emergency Use Authorization (EUA). This EUA will remain  in effect (meaning this test can be used) for the duration of the COVID-19 declaration under Section 564(b)(1) of the Act, 21 U.S.C.section 360bbb-3(b)(1), unless the authorization is terminated  or revoked sooner.       Influenza A by PCR NEGATIVE NEGATIVE Final   Influenza B by PCR NEGATIVE  NEGATIVE Final    Comment: (NOTE) The Xpert Xpress SARS-CoV-2/FLU/RSV plus assay is intended as an aid in the diagnosis of influenza from Nasopharyngeal swab specimens and should not be used as a sole basis for treatment. Nasal washings and aspirates are unacceptable for Xpert Xpress SARS-CoV-2/FLU/RSV testing.  Fact Sheet for Patients: bloggercourse.com  Fact Sheet for Healthcare Providers: seriousbroker.it  This test is not yet approved or cleared by the [place]  FDA and has been authorized for detection and/or diagnosis of SARS-CoV-2 by FDA under an Emergency Use Authorization (EUA). This EUA will remain in effect (meaning this test can be used) for the duration of the COVID-19 declaration under Section 564(b)(1) of the Act, 21 U.S.C. section 360bbb-3(b)(1), unless the authorization is terminated or revoked.     Resp Syncytial Virus by PCR NEGATIVE NEGATIVE Final    Comment: (NOTE) Fact Sheet for Patients: bloggercourse.com  Fact Sheet for Healthcare Providers: seriousbroker.it  This test is not yet approved or cleared by the [place]  FDA and has been authorized for detection and/or diagnosis of SARS-CoV-2 by FDA under an Emergency Use Authorization (EUA). This EUA will remain in effect (meaning this test can be used) for the duration of the COVID-19 declaration under Section 564(b)(1) of the Act, 21 U.S.C. section 360bbb-3(b)(1), unless the authorization is terminated or revoked.  Performed at [facility], [address], [place], [place] [address]      Time coordinating discharge: 35 minutes  SIGNED:   Derryl Duval, MD  [facility] 10/26/2024, 12:20 PM

## 2024-10-26 NOTE — Plan of Care (Signed)
  Problem: Clinical Measurements: Goal: Ability to maintain clinical measurements within normal limits will improve Outcome: Progressing Goal: Will remain free from infection Outcome: Progressing Goal: Diagnostic test results will improve Outcome: Progressing Goal: Respiratory complications will improve Outcome: Progressing Goal: Cardiovascular complication will be avoided Outcome: Progressing   Problem: Activity: Goal: Risk for activity intolerance will decrease Outcome: Progressing   Problem: Nutrition: Goal: Adequate nutrition will be maintained Outcome: Progressing   Problem: Coping: Goal: Level of anxiety will decrease Outcome: Progressing   Problem: Elimination: Goal: Will not experience complications related to bowel motility Outcome: Progressing Goal: Will not experience complications related to urinary retention Outcome: Progressing   Problem: Pain Managment: Goal: General experience of comfort will improve and/or be controlled Outcome: Progressing   Problem: Safety: Goal: Ability to remain free from injury will improve Outcome: Progressing   Problem: Skin Integrity: Goal: Risk for impaired skin integrity will decrease Outcome: Progressing   Problem: Ischemic Stroke/TIA Tissue Perfusion: Goal: Complications of ischemic stroke/TIA will be minimized Outcome: Progressing   Problem: Coping: Goal: Will verbalize positive feelings about self Outcome: Progressing Goal: Will identify appropriate support needs Outcome: Progressing   Problem: Health Behavior/Discharge Planning: Goal: Goals will be collaboratively established with patient/family Outcome: Progressing   Problem: Self-Care: Goal: Verbalization of feelings and concerns over difficulty with self-care will improve Outcome: Progressing Goal: Ability to communicate needs accurately will improve Outcome: Progressing   Problem: Nutrition: Goal: Risk of aspiration will decrease Outcome:  Progressing Goal: Dietary intake will improve Outcome: Progressing

## 2024-10-28 LAB — CULTURE, BLOOD (ROUTINE X 2)
Culture: NO GROWTH
Culture: NO GROWTH
Special Requests: ADEQUATE
Special Requests: ADEQUATE

## 2025-02-18 ENCOUNTER — Ambulatory Visit: Admitting: Neurology
